# Patient Record
Sex: Female | Born: 1940
Health system: Southern US, Community
[De-identification: ages and names within clinical notes are randomized; demographics above are authoritative.]

## PROBLEM LIST (undated history)

## (undated) DIAGNOSIS — D367 Benign neoplasm of other specified sites: Secondary | ICD-10-CM

## (undated) DIAGNOSIS — G4730 Sleep apnea, unspecified: Secondary | ICD-10-CM

## (undated) DIAGNOSIS — C50919 Malignant neoplasm of unspecified site of unspecified female breast: Secondary | ICD-10-CM

## (undated) DIAGNOSIS — I2510 Atherosclerotic heart disease of native coronary artery without angina pectoris: Secondary | ICD-10-CM

## (undated) DIAGNOSIS — I10 Essential (primary) hypertension: Secondary | ICD-10-CM

## (undated) DIAGNOSIS — F17201 Nicotine dependence, unspecified, in remission: Secondary | ICD-10-CM

## (undated) DIAGNOSIS — I679 Cerebrovascular disease, unspecified: Secondary | ICD-10-CM

## (undated) DIAGNOSIS — K219 Gastro-esophageal reflux disease without esophagitis: Secondary | ICD-10-CM

## (undated) DIAGNOSIS — J449 Chronic obstructive pulmonary disease, unspecified: Secondary | ICD-10-CM

## (undated) DIAGNOSIS — Z955 Presence of coronary angioplasty implant and graft: Secondary | ICD-10-CM

## (undated) DIAGNOSIS — M1990 Unspecified osteoarthritis, unspecified site: Secondary | ICD-10-CM

## (undated) DIAGNOSIS — F419 Anxiety disorder, unspecified: Secondary | ICD-10-CM

## (undated) DIAGNOSIS — F32A Depression, unspecified: Secondary | ICD-10-CM

## (undated) DIAGNOSIS — D563 Thalassemia minor: Secondary | ICD-10-CM

## (undated) DIAGNOSIS — N189 Chronic kidney disease, unspecified: Secondary | ICD-10-CM

## (undated) DIAGNOSIS — E785 Hyperlipidemia, unspecified: Secondary | ICD-10-CM

## (undated) HISTORY — DX: Hyperlipidemia, unspecified: E78.5

## (undated) HISTORY — DX: Chronic obstructive pulmonary disease, unspecified: J44.9

## (undated) HISTORY — DX: Essential (primary) hypertension: I10

## (undated) HISTORY — PX: CARDIAC CATHETERIZATION: SHX172

## (undated) HISTORY — PX: INGUINAL HERNIA REPAIR: SUR1180

## (undated) HISTORY — DX: Cerebrovascular disease, unspecified: I67.9

## (undated) HISTORY — DX: Malignant neoplasm of unspecified site of unspecified female breast: C50.919

## (undated) HISTORY — DX: Atherosclerotic heart disease of native coronary artery without angina pectoris: I25.10

## (undated) HISTORY — DX: Thalassemia minor: D56.3

## (undated) HISTORY — DX: Nicotine dependence, unspecified, in remission: F17.201

## (undated) HISTORY — PX: DILATION AND CURETTAGE OF UTERUS: SHX78

---

## 1978-06-02 HISTORY — PX: VAGINAL HYSTERECTOMY: SUR661

## 1997-12-13 ENCOUNTER — Ambulatory Visit (HOSPITAL_BASED_OUTPATIENT_CLINIC_OR_DEPARTMENT_OTHER): Admission: RE | Admit: 1997-12-13 | Discharge: 1997-12-13 | Payer: Self-pay | Admitting: *Deleted

## 1998-06-02 DIAGNOSIS — Z955 Presence of coronary angioplasty implant and graft: Secondary | ICD-10-CM

## 1998-06-02 HISTORY — PX: COLONOSCOPY: SHX174

## 1998-06-02 HISTORY — DX: Presence of coronary angioplasty implant and graft: Z95.5

## 1998-06-02 HISTORY — PX: ESOPHAGOGASTRODUODENOSCOPY: SHX1529

## 1998-11-09 ENCOUNTER — Encounter: Payer: Self-pay | Admitting: Family Medicine

## 1998-11-10 ENCOUNTER — Inpatient Hospital Stay (HOSPITAL_COMMUNITY): Admission: EM | Admit: 1998-11-10 | Discharge: 1998-11-13 | Payer: Self-pay | Admitting: Family Medicine

## 1999-04-30 ENCOUNTER — Ambulatory Visit (HOSPITAL_COMMUNITY): Admission: RE | Admit: 1999-04-30 | Discharge: 1999-04-30 | Payer: Self-pay | Admitting: *Deleted

## 1999-05-09 ENCOUNTER — Ambulatory Visit (HOSPITAL_COMMUNITY): Admission: RE | Admit: 1999-05-09 | Discharge: 1999-05-09 | Payer: Self-pay | Admitting: *Deleted

## 1999-06-03 DIAGNOSIS — I2510 Atherosclerotic heart disease of native coronary artery without angina pectoris: Secondary | ICD-10-CM

## 1999-06-03 HISTORY — DX: Atherosclerotic heart disease of native coronary artery without angina pectoris: I25.10

## 2000-05-14 ENCOUNTER — Inpatient Hospital Stay (HOSPITAL_COMMUNITY): Admission: AD | Admit: 2000-05-14 | Discharge: 2000-05-19 | Payer: Self-pay | Admitting: Family Medicine

## 2000-05-14 ENCOUNTER — Encounter: Payer: Self-pay | Admitting: Family Medicine

## 2000-05-15 ENCOUNTER — Encounter: Payer: Self-pay | Admitting: Family Medicine

## 2000-09-09 ENCOUNTER — Encounter (HOSPITAL_COMMUNITY): Admission: RE | Admit: 2000-09-09 | Discharge: 2000-10-09 | Payer: Self-pay | Admitting: Preventative Medicine

## 2003-09-18 ENCOUNTER — Ambulatory Visit (HOSPITAL_COMMUNITY): Admission: RE | Admit: 2003-09-18 | Discharge: 2003-09-18 | Payer: Self-pay | Admitting: Family Medicine

## 2004-06-02 HISTORY — PX: COLONOSCOPY: SHX174

## 2004-06-02 HISTORY — PX: ESOPHAGOGASTRODUODENOSCOPY: SHX1529

## 2004-11-14 ENCOUNTER — Ambulatory Visit (HOSPITAL_COMMUNITY): Admission: RE | Admit: 2004-11-14 | Discharge: 2004-11-14 | Payer: Self-pay | Admitting: Family Medicine

## 2005-05-14 ENCOUNTER — Encounter: Admission: RE | Admit: 2005-05-14 | Discharge: 2005-05-14 | Payer: Self-pay | Admitting: Gastroenterology

## 2005-05-19 ENCOUNTER — Ambulatory Visit (HOSPITAL_COMMUNITY): Admission: RE | Admit: 2005-05-19 | Discharge: 2005-05-19 | Payer: Self-pay | Admitting: Gastroenterology

## 2005-05-19 ENCOUNTER — Encounter (INDEPENDENT_AMBULATORY_CARE_PROVIDER_SITE_OTHER): Payer: Self-pay | Admitting: *Deleted

## 2005-05-22 ENCOUNTER — Ambulatory Visit: Payer: Self-pay | Admitting: Cardiology

## 2005-05-30 ENCOUNTER — Ambulatory Visit: Payer: Self-pay | Admitting: Cardiology

## 2005-05-30 ENCOUNTER — Encounter (HOSPITAL_COMMUNITY): Admission: RE | Admit: 2005-05-30 | Discharge: 2005-05-30 | Payer: Self-pay | Admitting: Cardiology

## 2006-06-19 ENCOUNTER — Ambulatory Visit (HOSPITAL_COMMUNITY): Admission: RE | Admit: 2006-06-19 | Discharge: 2006-06-19 | Payer: Self-pay | Admitting: Family Medicine

## 2006-11-12 ENCOUNTER — Emergency Department (HOSPITAL_COMMUNITY): Admission: EM | Admit: 2006-11-12 | Discharge: 2006-11-12 | Payer: Self-pay | Admitting: Emergency Medicine

## 2007-05-04 ENCOUNTER — Ambulatory Visit: Payer: Self-pay | Admitting: Cardiology

## 2007-05-04 ENCOUNTER — Ambulatory Visit (HOSPITAL_COMMUNITY): Admission: RE | Admit: 2007-05-04 | Discharge: 2007-05-04 | Payer: Self-pay | Admitting: Cardiology

## 2007-10-12 ENCOUNTER — Ambulatory Visit: Payer: Self-pay | Admitting: Cardiology

## 2007-10-13 ENCOUNTER — Ambulatory Visit (HOSPITAL_COMMUNITY): Admission: RE | Admit: 2007-10-13 | Discharge: 2007-10-13 | Payer: Self-pay | Admitting: Cardiology

## 2007-10-13 ENCOUNTER — Ambulatory Visit: Payer: Self-pay | Admitting: Cardiology

## 2007-10-13 ENCOUNTER — Encounter: Payer: Self-pay | Admitting: Cardiology

## 2007-10-22 ENCOUNTER — Ambulatory Visit: Payer: Self-pay | Admitting: Internal Medicine

## 2007-10-22 DIAGNOSIS — E782 Mixed hyperlipidemia: Secondary | ICD-10-CM | POA: Insufficient documentation

## 2007-10-22 DIAGNOSIS — I10 Essential (primary) hypertension: Secondary | ICD-10-CM

## 2007-10-22 DIAGNOSIS — R059 Cough, unspecified: Secondary | ICD-10-CM | POA: Insufficient documentation

## 2007-10-22 DIAGNOSIS — R05 Cough: Secondary | ICD-10-CM

## 2007-10-22 DIAGNOSIS — E785 Hyperlipidemia, unspecified: Secondary | ICD-10-CM | POA: Insufficient documentation

## 2007-11-05 ENCOUNTER — Ambulatory Visit: Payer: Self-pay | Admitting: Internal Medicine

## 2007-12-17 ENCOUNTER — Ambulatory Visit: Payer: Self-pay | Admitting: Internal Medicine

## 2007-12-17 ENCOUNTER — Encounter: Payer: Self-pay | Admitting: Internal Medicine

## 2007-12-31 ENCOUNTER — Ambulatory Visit: Payer: Self-pay | Admitting: Internal Medicine

## 2008-01-21 ENCOUNTER — Ambulatory Visit (HOSPITAL_COMMUNITY): Admission: RE | Admit: 2008-01-21 | Discharge: 2008-01-21 | Payer: Self-pay | Admitting: Family Medicine

## 2008-02-09 ENCOUNTER — Ambulatory Visit: Payer: Self-pay | Admitting: Cardiology

## 2008-02-17 ENCOUNTER — Other Ambulatory Visit: Admission: RE | Admit: 2008-02-17 | Discharge: 2008-02-17 | Payer: Self-pay | Admitting: Family Medicine

## 2008-06-12 ENCOUNTER — Ambulatory Visit: Payer: Self-pay | Admitting: Cardiology

## 2009-01-03 ENCOUNTER — Ambulatory Visit: Payer: Self-pay | Admitting: Cardiology

## 2009-01-03 ENCOUNTER — Encounter: Payer: Self-pay | Admitting: Cardiology

## 2009-01-03 DIAGNOSIS — Z87891 Personal history of nicotine dependence: Secondary | ICD-10-CM

## 2009-01-03 DIAGNOSIS — I2510 Atherosclerotic heart disease of native coronary artery without angina pectoris: Secondary | ICD-10-CM | POA: Insufficient documentation

## 2009-01-04 ENCOUNTER — Encounter: Payer: Self-pay | Admitting: Cardiology

## 2009-01-04 ENCOUNTER — Encounter (INDEPENDENT_AMBULATORY_CARE_PROVIDER_SITE_OTHER): Payer: Self-pay | Admitting: *Deleted

## 2009-01-04 LAB — CONVERTED CEMR LAB
Basophils Absolute: 0 10*3/uL (ref 0.0–0.1)
Basophils Relative: 0 % (ref 0–1)
Eosinophils Absolute: 0.3 10*3/uL
Eosinophils Relative: 3 %
Ferritin: 13 ng/mL
Ferritin: 13 ng/mL (ref 10–291)
Folate: >20 ng/mL
Folate: >20 ng/mL
Hemoglobin: 11.9 g/dL
Hemoglobin: 11.9 g/dL — ABNORMAL LOW (ref 12.0–15.0)
Iron: 53 ug/dL
Lymphocytes Relative: 20 %
Lymphocytes Relative: 20 % (ref 12–46)
Lymphs Abs: 1.6 10*3/uL
MCHC: 30.7 g/dL
Monocytes Absolute: 0.6 10*3/uL (ref 0.1–1.0)
Monocytes Relative: 8 %
Neutro Abs: 5.3 10*3/uL (ref 1.7–7.7)
Neutrophils Relative %: 68 % (ref 43–77)
RBC: 5.13 M/uL
RBC: 5.13 M/uL — ABNORMAL HIGH (ref 3.87–5.11)
RDW: 15.8 % — ABNORMAL HIGH (ref 11.5–15.5)
Saturation Ratios: 14 %
Saturation Ratios: 14 % — ABNORMAL LOW (ref 20–55)
TIBC: 381 ug/dL (ref 250–470)
UIBC: 328 ug/dL
WBC: 7.7 10*3/uL

## 2009-01-10 ENCOUNTER — Encounter: Payer: Self-pay | Admitting: Cardiology

## 2009-01-19 ENCOUNTER — Encounter: Payer: Self-pay | Admitting: Cardiology

## 2009-01-19 LAB — CONVERTED CEMR LAB: OCCULT 3: NEGATIVE

## 2009-01-22 ENCOUNTER — Ambulatory Visit (HOSPITAL_COMMUNITY): Admission: RE | Admit: 2009-01-22 | Discharge: 2009-01-22 | Payer: Self-pay | Admitting: Family Medicine

## 2009-02-06 ENCOUNTER — Encounter: Payer: Self-pay | Admitting: Cardiology

## 2009-02-06 ENCOUNTER — Encounter (INDEPENDENT_AMBULATORY_CARE_PROVIDER_SITE_OTHER): Payer: Self-pay | Admitting: *Deleted

## 2009-02-06 LAB — CONVERTED CEMR LAB
ALT: 14 units/L (ref 0–35)
Albumin: 4.4 g/dL
BUN: 20 mg/dL (ref 6–23)
CO2: 26 meq/L
CO2: 26 meq/L (ref 19–32)
Calcium: 9.7 mg/dL
Calcium: 9.7 mg/dL (ref 8.4–10.5)
Chloride: 105 meq/L
Chloride: 105 meq/L (ref 96–112)
Creatinine, Ser: 1.22 mg/dL — ABNORMAL HIGH (ref 0.40–1.20)
Glucose, Bld: 97 mg/dL
Glucose, Bld: 97 mg/dL (ref 70–99)
Potassium: 4.7 meq/L
Sodium: 143 meq/L
Total Bilirubin: 0.3 mg/dL (ref 0.3–1.2)
Total Protein: 7.2 g/dL

## 2009-02-07 ENCOUNTER — Encounter (INDEPENDENT_AMBULATORY_CARE_PROVIDER_SITE_OTHER): Payer: Self-pay | Admitting: *Deleted

## 2009-12-24 ENCOUNTER — Telehealth (INDEPENDENT_AMBULATORY_CARE_PROVIDER_SITE_OTHER): Payer: Self-pay | Admitting: *Deleted

## 2010-01-02 ENCOUNTER — Ambulatory Visit: Payer: Self-pay | Admitting: Cardiology

## 2010-01-02 ENCOUNTER — Ambulatory Visit (HOSPITAL_COMMUNITY): Admission: RE | Admit: 2010-01-02 | Discharge: 2010-01-02 | Payer: Self-pay | Admitting: Cardiology

## 2010-01-02 ENCOUNTER — Encounter: Payer: Self-pay | Admitting: Cardiology

## 2010-01-21 ENCOUNTER — Encounter: Payer: Self-pay | Admitting: Cardiology

## 2010-01-21 LAB — CONVERTED CEMR LAB
ALT: 16 units/L
Albumin: 4.5 g/dL
Alkaline Phosphatase: 49 units/L
Chloride: 105 meq/L
Cholesterol: 152 mg/dL
Glucose, Bld: 92 mg/dL
HCT: 36.6 %
Hemoglobin: 11.3 g/dL
Potassium: 3.9 meq/L
Sodium: 142 meq/L
Total Protein: 6.8 g/dL
Triglycerides: 113 mg/dL

## 2010-01-24 ENCOUNTER — Ambulatory Visit (HOSPITAL_COMMUNITY): Admission: RE | Admit: 2010-01-24 | Discharge: 2010-01-24 | Payer: Self-pay | Admitting: Family Medicine

## 2010-01-29 ENCOUNTER — Encounter (INDEPENDENT_AMBULATORY_CARE_PROVIDER_SITE_OTHER): Payer: Self-pay | Admitting: *Deleted

## 2010-01-31 ENCOUNTER — Ambulatory Visit: Payer: Self-pay | Admitting: Cardiology

## 2010-01-31 DIAGNOSIS — I679 Cerebrovascular disease, unspecified: Secondary | ICD-10-CM

## 2010-01-31 DIAGNOSIS — D509 Iron deficiency anemia, unspecified: Secondary | ICD-10-CM | POA: Insufficient documentation

## 2010-02-01 ENCOUNTER — Encounter (INDEPENDENT_AMBULATORY_CARE_PROVIDER_SITE_OTHER): Payer: Self-pay | Admitting: *Deleted

## 2010-02-11 ENCOUNTER — Ambulatory Visit (HOSPITAL_COMMUNITY): Admission: RE | Admit: 2010-02-11 | Discharge: 2010-02-11 | Payer: Self-pay | Admitting: Cardiology

## 2010-02-21 ENCOUNTER — Encounter (INDEPENDENT_AMBULATORY_CARE_PROVIDER_SITE_OTHER): Payer: Self-pay | Admitting: *Deleted

## 2010-07-02 NOTE — Letter (Signed)
Summary: [facility]  [facility]   Imported By: Faythe Ghee 01/21/2010 11:33:11  _____________________________________________________________________  External Attachment:    Type:   Image     Comment:   External Document

## 2010-07-02 NOTE — Letter (Signed)
Summary: [place] Future Lab Work Engineer, agricultural at [employer]  [address], [place] [address]   Phone: [phone]  Fax: [phone]     February 01, 2010 MRN: [id number]   [facility] [address] [place], [place]  [address]      YOUR LAB WORK IS DUE   August 01, 2010  Please go to [facility], located across the street from [facility] on the second floor.  Hours are Monday - Friday 7am until 7:30pm         Saturday 8am until 12noon    _X_  DO NOT EAT OR DRINK AFTER MIDNIGHT EVENING PRIOR TO LABWORK  __ YOUR LABWORK IS NOT FASTING --YOU MAY EAT PRIOR TO LABWORK

## 2010-07-02 NOTE — Letter (Signed)
Summary: Generic Letter  Architectural technologist at [facility]  [address] [place] [address]   Phone: [phone]  Fax: [phone]        February 01, 2010 MRN: [id number]    [facility] [address] [place], [place]  [address]    Dear Ms. DAWE,       After you left the office visit on 01/31/10.  Dr. Dietrich Pates decided to increase your crestor to 40mg  daily.  You are to begin this with your next refill.  I have sent the prescription to the drugstore.  Dr. Dietrich Pates would like to repeat a lipid panel in 6 months.      Sincerely, Teressa Lower RN  This letter has been electronically signed by your physician.

## 2010-07-02 NOTE — Letter (Signed)
Summary: [place] Results Engineer, agricultural at [facility]  [address] [place] [address]   Phone: [phone]  Fax: [phone]      February 21, 2010 MRN: [id number]   [facility] [address] [place], [place]  [address]   Dear Ms. Maisie Fus,  Your test ordered by Selena Batten has been reviewed by your physician (or physician assistant) and was found to be normal or stable. Your physician (or physician assistant) felt no changes were needed at this time.  ____ Echocardiogram  ____ Cardiac Stress Test  ____ Lab Work  __X__ Peripheral vascular study of arms, legs or neck  ____ CT scan or X-ray  ____ Lung or Breathing test  ____ Other:  No change in medical treatment at this time, per Dr. Dietrich Pates.    Thank you, Nahlia Hellmann Allyne Gee RN    [place] Bing, MD, Lenise Arena.C.Gaylord Shih, MD, F.A.C.C Lewayne Bunting, MD, F.A.C.C Nona Dell, MD, F.A.C.C Charlton Haws, MD, Lenise Arena.C.C

## 2010-07-02 NOTE — Assessment & Plan Note (Signed)
Summary: F1Y  Medications Added BENICAR HCT 40-25 MG TABS (OLMESARTAN MEDOXOMIL-HCTZ) take 1 tab daily CRESTOR 40 MG TABS (ROSUVASTATIN CALCIUM) Take one tablet by mouth daily. ASPIR-LOW 81 MG TBEC (ASPIRIN) take 1 tab daily      Allergies Added: NKDA  Visit Type:  Follow-up Primary Provider:  Ocie Bob   History of Present Illness: [name] returns to the office for continued assessment and treatment of coronary artery disease and cardiovascular risk factors.  Since her last visit, she has done quite well.  She reports no chest pain nor dyspnea.  She has no orthopnea, PND, lightheadedness nor syncope.  Prior records were obtained from [facility] and reviewed.  -  Date:  01/21/2010    LDL-calculated: 81   Current Medications (verified): 1)  Metformin Hcl 500 Mg  Tabs (Metformin Hcl) .... Take 1 Tablet By Mouth Once A Day 2)  Toprol Xl 25 Mg  Tb24 (Metoprolol Succinate) .... Take 1 Tablet By Mouth Once A Day 3)  Benicar Hct 40-25 Mg Tabs (Olmesartan Medoxomil-Hctz) .... Take 1 Tab Daily 4)  Amlodipine Besylate 10 Mg Tabs (Amlodipine Besylate) .... Take One Tablet By Mouth Daily 5)  Folic Acid 1 Mg Tabs (Folic Acid) .... Take 1 Tablet By Mouth Once A Day 6)  Crestor 40 Mg Tabs (Rosuvastatin Calcium) .... Take One Tablet By Mouth Daily. 7)  Fenofibrate 160 Mg Tabs (Fenofibrate) .... Take One Tablet By Mouth Daily With A Meal 8)  Aspir-Low 81 Mg Tbec (Aspirin) .... Take 1 Tab Daily  Allergies (verified): No Known Drug Allergies  Past History:  PMH, FH, and Social History reviewed and updated.  Past Medical History: ASCVD: Bare-metal stent placed in the right coronary artery in 12/01; residual 50% lesion of the first diagonal and mid LAD. HYPERTENSION (ICD-401.9) HYPERLIPIDEMIA (ICD-272.4) Adult-onset diabetes mellitus; excellent control with a low-dose of a single oral agent. Tobacco use-discontinued in 1980. COUGH; Onset in 2009; marked improvement by  2010.  Past Surgical History: Hernia repair 1970's Vaginal hysterectomy and unilateral oophorectomy-1980. Colonoscopy-approximately 2006  Review of Systems       See history of present illness.  Vital Signs:  Patient profile:   70 year old female Weight:      178 pounds BMI:     31.65 Pulse rate:   75 / minute BP sitting:   122 / 78  (right arm)  Vitals Entered By: Dreama Saa, CNA (January 31, 2010 10:29 AM)  Physical Exam  General:  Pleasant; overweight; well developed; no acute distress:   Weight: 178, 8 pounds more than last year Neck-No JVD; faint bilateral carotid bruits: Lungs-No tachypnea, no rales; no rhonchi; no wheezes: Cardiovascular-normal PMI; normal S1 and S2; minimal systolic murmur Abdomen-BS normal; soft and non-tender without masses or organomegaly:  Musculoskeletal-No deformities, no cyanosis or clubbing: Neurologic-Normal cranial nerves; symmetric strength and tone:  Skin-Warm, no significant lesions: Extremities-Nl distal pulses; no edema:     Exercise Stress Test  Procedure date:  01/02/2010  Findings:      CONCLUSION:   1. Negative exercise treadmill test for ischemia by standard criteria       the maximum workload of 10.1 mets.  No chest pain was reported.       There was a mildly hypertensive response to exercise.   2. No inducible wall motion abnormalities to indicate ischemia.               Jonelle Sidle, MD  SGM/MEDQ  D:  01/02/2010  T:  01/03/2010  Job:  [id number]   Impression & Recommendations:  Problem # 1:  CAROTID BRUITS, BILATERAL (ICD-785.9) Bruits have not been appreciated at previous visits, but patient has known vascular disease, and a carotid ultrasound study is warranted.  Problem # 2:  ATHEROSCLEROTIC CARDIOVASCULAR DISEASE (ICD-429.2) No symptoms at present to suggest myocardial ischemia; management will continue to focus on optimal control of risk factors.  Problem # 3:  HYPERTENSION  (ICD-401.9) Blood pressure control has been excellent; current medications will be continued.  Problem # 4:  HYPERLIPIDEMIA (ICD-272.4) Reason lipid profile was quite good with total cholesterol of 152, triglycerides 113, HDL 48 and LDL of 81.  Slightly better results might be obtained with Crestor 40 mg q.d., which will be prescribed.  Lipid profile will be repeated in 6 months with a return visit in one year.  Problem # 5:  ANEMIA (ICD-285.9)  Patient has had marginally low hemoglobin, generally around 11-11.5, with a slightly low MCV in the mid 70s and iron studies suggestive of  iron deficiency(iron of 53, TIBC of 381 and ferritin of 13), but with negative stool Hemoccult testing.  Additional Hemoccult samples will be collected.  I've advised Ms. Righter to discuss this with Dr. Parke Simmers and to consider either additional GI studies or hematology consultation.  A hemoglobin electrophoresis would be worthwhile.  Ms. Strick underwent colonoscopy approximately 5 years ago with negative results.  Other Orders: Hemoccult Cards (Take Home) (Hemoccult Cards) Carotid Duplex (Carotid Duplex) Future Orders: T-Lipid Profile ([id number]) ... 08/01/2010  Patient Instructions: 1)  Your physician recommends that you schedule a follow-up appointment in: 1 year 2)  Your physician has requested that you have a carotid duplex. This test is an ultrasound of the carotid arteries in your neck. It looks at blood flow through these arteries that supply the brain with blood. Allow one hour for this exam. There are no restrictions or special instructions. 3)  Your physician has asked that you test your stool for blood. It is necessary to test 3 different stool specimens for accuracy. You will be given 3 hemoccult cards for specimen collection. For each stool specimen, place a small portion of stool sample (from 2 different areas of the stool) into the 2 squares on the card. Close card. Repeat with 2 more stool specimens.  Bring the cards back to the office for testing. 4)  Your physician has recommended you make the following change in your medication: increase crestor to 40mg  daily with next refill 5)  Your physician recommends that you return for lab work in: fasting lipid panel in 6 months Prescriptions: CRESTOR 40 MG TABS (ROSUVASTATIN CALCIUM) Take one tablet by mouth daily.  #30 x 3   Entered by:   Teressa Lower RN   Authorized by:   Kathlen Brunswick, MD, [facility]   Signed by:   Teressa Lower RN on 02/01/2010   Method used:   Electronically to        [facility]  [address] (retail)       [address]       [place], [place]  [address]       Ph: [phone]       Fax: [phone]   RxID:   [id number]

## 2010-07-02 NOTE — Miscellaneous (Signed)
Summary: LABS CMP,02/06/2009  Clinical Lists Changes  Observations: Added new observation of CALCIUM: 9.7 mg/dL (98/04/9146 8:29) Added new observation of ALBUMIN: 4.4 g/dL (56/21/3086 5:78) Added new observation of PROTEIN, TOT: 7.2 g/dL (46/96/2952 8:41) Added new observation of SGPT (ALT): 14 units/L (02/06/2009 9:48) Added new observation of SGOT (AST): 20 units/L (02/06/2009 9:48) Added new observation of ALK PHOS: 55 units/L (02/06/2009 9:48) Added new observation of CREATININE: 1.22 mg/dL (32/44/0102 7:25) Added new observation of BUN: 20 mg/dL (36/64/4034 7:42) Added new observation of BG RANDOM: 97 mg/dL (59/56/3875 6:43) Added new observation of CO2 PLSM/SER: 26 meq/L (02/06/2009 9:48) Added new observation of CL SERUM: 105 meq/L (02/06/2009 9:48) Added new observation of K SERUM: 4.7 meq/L (02/06/2009 9:48) Added new observation of NA: 143 meq/L (02/06/2009 9:48) Added new observation of FOLATE: >20.0 ng/mL (01/04/2009 9:48) Added new observation of FERRITIN: 13 ng/mL (01/04/2009 9:48) Added new observation of IRON SATUR %: 14 % (01/04/2009 9:48) Added new observation of TIBC: 381 mcg/dL (32/95/1884 1:66) Added new observation of UIBC: 328 mcg/dL (11/30/1599 0:93) Added new observation of IRON: 53 mcg/dL (23/55/7322 0:25) Added new observation of ABSOLUTE BAS: 0.0 K/uL (01/04/2009 9:48) Added new observation of BASOPHIL %: 0 % (01/04/2009 9:48) Added new observation of EOS ABSLT: 0.3 K/uL (01/04/2009 9:48) Added new observation of % EOS AUTO: 3 % (01/04/2009 9:48) Added new observation of ABSOLUTE MON: 0.6 K/uL (01/04/2009 9:48) Added new observation of MONOCYTE %: 8 % (01/04/2009 9:48) Added new observation of ABS LYMPHOCY: 1.6 K/uL (01/04/2009 9:48) Added new observation of LYMPHS %: 20 % (01/04/2009 9:48) Added new observation of PLATELETK/UL: 308 K/uL (01/04/2009 9:48) Added new observation of RDW: 15.8 % (01/04/2009 9:48) Added new observation of MCHC RBC: 30.7 g/dL  (42/70/6237 6:28) Added new observation of MCV: 75.6 fL (01/04/2009 9:48) Added new observation of HCT: 38.8 % (01/04/2009 9:48) Added new observation of HGB: 11.9 g/dL (31/51/7616 0:73) Added new observation of RBC M/UL: 5.13 M/uL (01/04/2009 9:48) Added new observation of WBC COUNT: 7.7 10*3/microliter (01/04/2009 9:48)

## 2010-07-02 NOTE — Progress Notes (Signed)
Summary: Returning your call   Phone Note Call from Patient   Caller: Patient Reason for Call: Talk to Doctor Summary of Call: pt returning call to [facility] regarding [facility] Initial call taken by: Raechel Ache [facility],  December 24, 2009 10:57 AM  Follow-up for Phone Call        Pt made aware of need for stress echo before exercise program Follow-up by: Teressa Lower RN,  December 24, 2009 2:34 PM  Additional Follow-up for Phone Call Additional follow up Details #1::        appt for stress echo 01/02/2010 9:30am called pt, instructions given to hold toprol and all other bp meds that am Additional Follow-up by: Teressa Lower RN,  December 25, 2009 4:28 PM

## 2010-08-01 ENCOUNTER — Encounter: Payer: Self-pay | Admitting: Cardiology

## 2010-08-01 LAB — CONVERTED CEMR LAB
LDL Cholesterol: 68 mg/dL (ref 0–99)
Total CHOL/HDL Ratio: 3.4
VLDL: 26 mg/dL (ref 0–40)

## 2010-08-05 ENCOUNTER — Encounter (INDEPENDENT_AMBULATORY_CARE_PROVIDER_SITE_OTHER): Payer: Self-pay | Admitting: *Deleted

## 2010-08-13 NOTE — Letter (Signed)
Summary: [place] Results Engineer, agricultural at [place]  [address] [place] [address]   Phone: [phone]  Fax: [phone]      August 05, 2010 MRN: [id number]   [facility] [address] [place], [place]  [address]   Dear Ms. Maisie Fus,  Your test ordered by Selena Batten has been reviewed by your physician (or physician assistant) and was found to be normal or stable. Your physician (or physician assistant) felt no changes were needed at this time.  ____ Echocardiogram  ____ Cardiac Stress Test  __x__ Lab Work  ____ Peripheral vascular study of arms, legs or neck  ____ CT scan or X-ray  ____ Lung or Breathing test  ____ Other:  No change in medical treatment at this time, per Ancil Linsey. Thank you, Mariachristina Holle Allyne Gee RN    [place] Bing, MD, Lenise Arena.C.Gaylord Shih, MD, F.A.C.C Lewayne Bunting, MD, F.A.C.C Nona Dell, MD, F.A.C.C Charlton Haws, MD, Lenise Arena.C.C

## 2010-10-15 NOTE — Letter (Signed)
Oct 12, 2007    Renaye Rakers, M.D.  [phone]  [place], [place] [address]   RE:  Nancy Liu, Nancy Liu  MRN:  [id number]  /  DOB:  [birth year]   Dear Nancy Liu:   Nancy Liu returns to the office for continued assessment and treatment  of coronary disease, now eight years following stent implantation in the  right coronary artery.  She has done well from a cardiac standpoint with  no subsequent symptoms to suggest myocardial ischemia and good control  of risk factors.  Unfortunately, she has experienced a nonproductive  cough for the past 5-6 months.  A chest x-ray performed last December  was negative.  She does not recall a URI initiating these symptoms.  She  has paroxysms of cough associated with dyspnea and chest tightness.  Symptoms increase which he lies flat and supine.  She has had no edema  nor exertional dyspnea.  She did have wheezing at one time, but you  provided her with albuterol and Singulair, which has resolved those  symptoms.   CURRENT MEDICATIONS:  Current medications are unchanged from her last  visit except for the addition of the above-noted drugs.   PHYSICAL EXAMINATION:  GENERAL APPEARANCE:  A pleasant woman with  frequent cough.  VITAL SIGNS:  The weight is 171, 1 pound more than in December.  Blood  pressure 135/90, heart rate 70 and regular, respirations 14.  NECK:  No jugular venous distention; normal carotid upstrokes without  bruits.  LUNGS:  Entirely clear.  Normal expiratory phase.  CARDIAC:  Normal first and second heart sounds; fourth heart sound and  modest systolic ejection murmur at the cardiac base.  ABDOMEN:  Soft and nontender; no bruits.  EXTREMITIES:  No edema; distal pulses intact.   On update of her social history, she has a 35 pack-year use of  cigarettes that was discontinued in 1980.   IMPRESSION:  Nancy Liu has a persistent cough that is essentially  nonproductive and for which there is no apparent etiology.  She is not  using  an ACE inhibitor.  I doubt that this is cardiac asthma, but will  check a chemistry profile, BNP level and an echocardiogram.  Chest x-ray  will be repeated.  As we discussed, I will refer her to [facility] for further assessment and treatment.  I will plan to see this  nice woman again in 3 months.    Sincerely,      Gerrit Friends. Dietrich Pates, MD, [facility]  Electronically Signed    RMR/MedQ  DD: 10/12/2007  DT: 10/12/2007  Job #: [id number]

## 2010-10-15 NOTE — Letter (Signed)
February 09, 2008    Nancy Rakers, MD  [phone] [address]  [place], [place] [address]   RE:  Nancy Liu, Nancy Liu  MRN:  [id number]  /  DOB:  07-24-1940   Dear Nancy Liu,   Nancy Liu returns to the office for continued assessment and treatment  of coronary disease and cardiovascular risk factors.  Since her last  visit, she has been seen on number of occasions by Dr. Sherene Sires.  He has  treated her for GERD primarily with very substantial improvement in her  chronic cough.  She still needs to clear her throat every once in a  while and experiences minimal dyspnea and now an again, but says that  she is very happy with the way she feels on the day-to-day basis.  She  has had no recurrent chest discomfort.  She went through number of  trials off medication and is currently off rosuvastatin and Niaspan.  She has stopped metformin due to constipation.  I suggested that she  discuss this with you.   CURRENT MEDICATIONS:  1. Toprol 25 mg daily.  2. Benicar HCT 40/12.5 mg daily.  3. Amlodipine 10 mg daily.  4. Ezetimibe 10 mg daily.  5. Albuterol by MDI.  6. Singulair 10 mg daily.  7. Meclizine 25 mg t.i.d.  8. Aciphex 20 mg daily.  9. Tramadol 50 mg p.r.n. cough.   PHYSICAL EXAMINATION:  GENERAL:  Pleasant overweight woman in no acute  distress.  VITAL SIGNS:  Weight is 175, 5 pounds more than at her last visit.  Blood pressure 110/70, heart rate 70 and regular, respirations 15.  NECK:  No jugular venous distention; no carotid bruits.  LUNGS:  Entirely clear.  CARDIAC:  Normal first and second heart sounds; fourth heart sound and  modest systolic murmur noted.  ABDOMEN:  Soft and nontender; no bruits; no organomegaly.  EXTREMITIES:  No edema.   IMPRESSION:  Nancy Liu is doing quite well overall.  She has no  symptoms to suggest recurrent ischemic heart disease.  She will resume  Niaspan and titrate back to her dose of 1000 mg daily.  She will resume  rosuvastatin.  A lipid profile will  be obtained in 3 months.   Blood pressure is quite low.  She is on a low dose of beta-blocker,  which may not be provided much benefit.  Toprol will be discontinued.  She will follow blood pressures and call should there be significant  elevations above 135/85.  I will plan to see this nice woman again in 9  months.    Sincerely,      Nancy Friends. Dietrich Pates, MD, [facility]  Electronically Signed    RMR/MedQ  DD: 02/09/2008  DT: 02/10/2008  Job #: [id number]

## 2010-10-15 NOTE — Letter (Signed)
May 04, 2007    Renaye Rakers, M.D.  [phone] [address]  [place], [place] [address]   RE:  OMAIRA, MELLEN  MRN:  [id number]  /  DOB:  1941/01/08   Dear Adrian Saran:   Ms. Ledger returns to the office after a two-year hiatus.  Fortunately,  she has done well in that interval.  She has continuing intermittent  class 2 dyspnea on exertion, but walks up to three miles a day most  days, without difficulty.  She does not assess diabetic control because  her device at home is not functional.  A recent hemoglobin A1c level was  excellent at 6.5.  Her blood pressure has apparently been good.  Lipid  control is sub-optimal, based upon a recent profile.  She has had no  chest discomfort.  She has had no significant medical issues since her  last visit.   CURRENT MEDICATIONS:  1. Metformin 500 mg daily.  2. Metoprolol 25 mg daily.  3. Amlodipine 10 mg daily.  4. Benicar 40/25 mg daily.  5. Kay-Ciel 20 mEq twice daily.  6. Ezetimibe 10 mg daily.  7. Doxazosin 4 mg daily.  8. Rosuvastatin 10 mg daily.  9. Niaspan 500 mg daily.  10.Aspirin 81 mg daily.   PHYSICAL EXAMINATION:  GENERAL:  A very pleasant woman, in no acute  distress.  VITAL SIGNS:  Weight 170 pounds, eight pounds more than two years ago.  Blood pressure 130/85, heart rate 76 and regular, respirations 16.  NECK:  No jugular venous distention.  Normal carotid upstrokes without  bruits.  LUNGS:  Clear.  HEART:  Normal first and second heart sounds.  Fourth heart sound  present.  ABDOMEN:  Soft, nontender.  No organomegaly.  EXTREMITIES:  No edema.  Normal distal pulses.   IMPRESSION/PLAN:  Ms. Barman is doing generally well:  To simplify and  improve her lipid control regimen, we will stop her rosuvastatin and  Zetia and start Vytorin 80/10 mg daily.  Her dose of Niaspan will be  increased in a step-wise fashion to a total of 1500 mg daily.  A lipid  profile will be repeated in one month.  If the results are good, will  plan to see this nice woman again in one year.  Control of other  cardiovascular risk factors appears to be optimal.    Sincerely,      Gerrit Friends. Dietrich Pates, MD, [facility]  Electronically Signed    RMR/MedQ  DD: 05/04/2007  DT: 05/04/2007  Job #: [id number]

## 2010-10-18 NOTE — Op Note (Signed)
[facility]  Patient:    Nancy Liu                       MRN: [id number] Proc. Date: 05/09/99 Adm. Date:  [id number] Attending:  Sharyn Dross                           Operative Report  PREOPERATIVE DIAGNOSES: 1. Hemoccult positive. 2. History of gastroesophageal reflux disease.  POSTOPERATIVE DIAGNOSIS:  Normal colonoscopic examination to the cecum.  OPERATION:  Colonoscopy.  SURGEON:  Sharyn Dross., M.D.  PREMEDICATION:  Demerol 40 mg IV and Versed 4 mg IV over a 10 minute period of time.  INSTRUMENTS:  The Olympus video pancolonoscope.  INDICATION:  This pleasant 70 year old female presented to the office with a history of gastroesophageal reflux problems at this time.  The patient is presently taking medications which appears to be helping but occasionally the patient has  reflux symptoms as noted.  PHYSICAL EXAMINATION:  GENERAL:  She is a pleasant female in no distress.  VITAL SIGNS:  Stable.  HEENT:  Anicteric.  NECK:  Supple.  LUNGS:  Clear.  HEART:  Regular rate and rhythm without heaves, thrills, murmurs, or gallops.  ABDOMEN:  No tenderness.  No hepatosplenomegaly appreciated.  EXTREMITIES:  Unremarkable.  RECTAL:  Hemoccult test was positive at this time.  PLAN:  I will proceed with the colonoscopic examination to evaluate the reason or the heme positive stool.  INFORMED CONSENT:  The patient was advised of the procedure, indications, and the risks involved.  The patient has agreed to have the procedure performed.  DESCRIPTION OF PROCEDURE:  The patient was brought in the endoscopy unit where n IV for IV sedating medication was used.  A monitor was placed on the patient to  monitor the patients vital signs and oxygen saturation.  Nasal oxygen at two liters per minute was used.  After adequate sedation was performed, the procedure was begun.  The instrument was advanced to the patient lying  the left lateral position to approximately 90 cm proximal colon to the cecum.  This was confirmed by transillumination, palpation, as well as visualization at the appendiceal orifice.  There appeared to be no gross abnormalities, masses, polyps, or stricture lesions appreciated.  The vascular pattern appeared to be well within normal limits throughout the entire colon.  The mucosal pattern showed no evidence of any granular changes or diverticular changes at this time.  There is no increased tortuosity of the colon upon traversing the region.  There was no evidence of internal or external hemorrhoids upon exiting from the area.  The instrument was removed per rectum without difficulty.  The patient tolerated the procedure well.  TREATMENT:  Conservative management at this time.  We will have the patient follow up with me in the office. DD:  05/09/99 TD:  05/10/99 Job: [id number]

## 2010-10-18 NOTE — Op Note (Signed)
Nancy Liu, Nancy Liu               ACCOUNT NO.:  [id number]   MEDICAL RECORD NO.:  [id number]          PATIENT TYPE:  AMB   LOCATION:  ENDO                         FACILITY:  [facility]   PHYSICIAN:  Anselmo Rod, M.D.  DATE OF BIRTH:  05-27-1941   DATE OF PROCEDURE:  05/19/2005  DATE OF DISCHARGE:  05/19/2005                                 OPERATIVE REPORT   PROCEDURE:  Screening colonoscopy.   ENDOSCOPIST:  Charna Elizabeth, M.D.   INSTRUMENT USED:  Olympus video colonoscope.   INDICATIONS FOR PROCEDURE:  70 year old African American female undergoing  screening colonoscopy.  The patient has a history of rectal bleeding with  iron deficiency anemia, rule out colonic polyps, masses, etc.   PREPROCEDURE PREPARATION:  Informed consent was obtained from the patient.  The patient was fasted for eight hours prior to the procedure and prepped  with a bottle of magnesium citrate and a gallon of GoLYTELY the night prior  to the procedure.  She received prophylactic antibiotics prior to the  procedure, as well.  The risks and benefits of the procedure including a 10%  miss rate of cancer and polyps were discussed with the patient.   PREPROCEDURE PHYSICAL:  Patient with stable vital signs.  Neck supple.  Chest clear to auscultation.  S1 and S2 regular.  Abdomen soft with normal  bowel sounds.   DESCRIPTION OF PROCEDURE:  The patient was placed in the left lateral  decubitus position, sedated with an additional 25 mcg Fentanyl.  Once the  patient was adequately sedated, maintained on low flow oxygen and continuous  cardiac monitoring, the Olympus video colonoscope was advanced from the  rectum to the cecum.  The appendiceal orifice and ileocecal valve were  clearly visualized and photographed.  No masses, polyps, erosions,  ulcerations, or diverticula were seen.  The terminal ileum appeared healthy  without lesions.  Retroflexion in the rectum revealed no abnormalities.  The  patient tolerated  the procedure well.   IMPRESSION:  Normal colonoscopy to the terminal ileum, no masses, polyps,  diverticula, or hemorrhoids seen.   RECOMMENDATIONS:  1.  Continue high fiber diet with liberal fluid intake.  2.  Outpatient follow up in the next four weeks for further recommendations.  3.  Repeat colonoscopy is recommended in the next ten years unless the      patient develops any abnormal symptoms in the interim.      Anselmo Rod, M.D.  Electronically Signed     JNM/MEDQ  D:  05/22/2005  T:  05/23/2005  Job:  [id number]   cc:   Renaye Rakers, M.D.  Fax: [phone]

## 2010-10-18 NOTE — Cardiovascular Report (Signed)
[place]. [facility]  Patient:    Nancy Liu, Nancy Liu                      MRN: [id number] Proc. Date: 05/18/00 Adm. Date:  [id number] Attending:  Beverely Low CC:         Geraldo Pitter, M.D.   Cardiac Catheterization  PROCEDURES: 1. Left heart catheterization. 2. Coronary angiography. 3. Selective renal angiography. 4. Right coronary artery - proximal mid and distal.    a. Percutaneous transluminal coronary balloon angioplasty.    b. Placement of intracoronary stent x 3.  COMPLICATIONS:  None.  INDICATIONS:  Nancy Liu is a 70 year old black female, patient of Dr. Renaye Rakers, with a history of hypertension and hyperlipidemia, as well as chronic renal insufficiency.  She was admitted for crescendo angina.  She is n now referred for cardiac catheterization to define her coronary status.  DESCRIPTION OF PROCEDURE:  After given informed written consent, the patient was brought to the cardiac catheterization lab where her right and left groins were shaved, prepped, and draped in the usual sterile fashion.  Next, a #6 [place] arterial sheath was then inserted in the right femoral artery without complication.  A 6 French diagnostic catheter was then used to perform diagnostic angiography.  This reveals a medium sized left main which was short with no significant disease.  The LAD is a medium sized vessel which coursed to the apex and gave rise to two diagonal branches.  The LAD is noted to be irregular and tapers to a very small vessel at its distal apex but has no high-grade lesion.  The first diagonal is a medium sized vessel with a 50% proximal lesion.  The second diagonal is a small vessel which bifurcates in its proximal portion and has no significant disease.  There is a 50% lesion in the LAD beyond the takeoff of the second diagonal.  The left coronary artery also gave rise to a large ramus intermedius which bifurcates distally and has no  significant disease.  The left circumflex is a medium sized vessel which coursed in the AV groove and gives rise to one obtuse marginal branch.  There is no significant disease in the AV groove circumflex or the first OM.  The right coronary artery is a large vessel which is dominant and gives rise to both the PDA as well as the posterolateral branch.  The RCA is noted to have a 90% proximal, 90% mid, and 80% long distal lesion prior to the bifurcation of the PDA and posterolateral branch.  The PDA and posterolateral branch are medium sized vessels which are irregular but have no high-grade lesion.  There is no LV-gram performed secondary to elevation in creatinine.  By a 2-D echocardiogram, she has normal LV function.  Selected renal angiogram reveals a 30% left proximal renal artery stenosis and 20% proximal right renal artery stenosis.  HEMODYNAMICS:  Systemic pressure 205/100, LV systemic pressure 206/12, LVEDP of 14.  INTERVENTIONAL PROCEDURE:  RCA:  Following diagnostic angiography, the #6 French arterial sheath was then exchanged for a #7 arterial sheath.  A #7 [place] guiding catheter with side holes was coaxially engaged in the right coronary ostium.  Selective angiogram was then performed.  Next, a long, Patriot 0.014 exchange-length guidewire was advanced out of the guiding catheter into the proximal RCA.  The guidewire was then positioned in the PDA without difficulty.  Next, a Maverick 2.75 x 15 mm coronary  balloon was then tracked into the mid RCA stenotic lesion.  One subsequent inflation to a maximum of 8 atmospheres was then performed for a total of one minute. Followup angiogram revealed a small linear dissection with TIMI-3 flow to the distal vessel.  The balloon was then pulled back to the proximal segment and one inflation to a maximum of 10 atmospheres was then performed for a total of one minute.  Followup angiogram revealed irregularities of the proximal  RCA with TIMI-3 flow to the distal vessel.  The balloon was then removed and a NIR Elite 2.5 x 18 mm stent was then tracked into the distal portion of the RCA with careful attention not to extend it beyond he ostium of the PDA and posterolateral branch.  This stent was then employed to a maximum of 14 atmospheres for a total of one minute.  Followup angiogram revealed no evidence of dissection or thrombus or TIMI-3 flow to the distal vessel.  This balloon was then removed, and a second NIR Elite 3.0 x 12 mm stent was then placed in the midportion of the RCA with careful attention to overlap the mid stenotic lesion.  This stent was then deployed to a maximum of 14 atmospheres for a total of one minute.  This balloon was then removed and a third NIR Elite 3.0 x 9 mm stent was then positioned in the proximal RCA stenotic lesion.  This stent was then deployed to a maximum of 14 atmospheres for a total of one minute.  Followup angiogram revealed no evidence of dissection or thrombus with TIMI-3 flow to the distal vessel.  Intravenous doses of ReoPro were given.  IV heparin was given to maintain the ACT within 200 and 300.  Final orthogonal angiograms reveal less than 10% residual stenosis in the proximal, mid and distal RCA stenotic lesion with TIMI-3 flow to the distal vessel and no evidence of dissection or thrombus.  At this point we elected to complete the procedure.  All balloons, wires, and catheters removed. Hemostatic sheaths were sewn in place.  The patient was transferred back to the ward in stable condition.  CONCLUSIONS: 1. Successful percutaneous transluminal coronary balloon angioplasty and    placement of a NIR Elite 2.5 x 18 mm coronary stent in the distal right    coronary artery. 2. Successful percutaneous transluminal coronary balloon angioplasty and    placement of a NIR Elite 3.0 x 12 mm coronary stent in the mid right    coronary artery stenotic lesion. 3. Successful  percutaneous transluminal coronary balloon angioplasty and     placement of a NIR Elite 3.0 x 9 mm coronary stent in the proximal    right coronary artery stenotic lesion. 4. Adjunct use of ReoPro (abciximab) infusion. 5. Systemic hypertension. 6. No evidence of significant renal artery stenosis. DD:  05/18/00 TD:  05/19/00 Job: [id number]

## 2010-10-18 NOTE — Discharge Summary (Signed)
[place]. [facility]  Patient:    Nancy Liu, Nancy Liu                      MRN: [id number] Adm. Date:  [id number] Disc. Date: [id number] Attending:  Beverely Low                           Discharge Summary  HISTORY:  The patient is a 71 year old female who was seen in the office two days ago with elevated blood pressure.  We treated her in the office and were able to get her blood pressure down.  She called and said that she had had loss of consciousness the day before, and we had her come into the office where we evaluated her.  At that time, blood pressure was 144/75 and 119/75. We were concerned about the loss of consciousness and hypertension, and she complained of low back pain, and this hospitalization was felt to be appropriate.  HOSPITAL COURSE:  She was admitted to the hospital with severe low back pain. X-rays were done.  Renal ultrasound was also done.  A 2-D echocardiogram showed normal left ventricular function with left ventricular hypertrophy. Renal ultrasounds were negative.  Pulse rate was at 50.  Blood pressure was 120/70.  She was on Toprol, which we decreased.  There was concern though with the problems that she was having, and we got a consultation with cardiology. Cardiology saw the patient and felt that because she had had some exertional pain, that we needed to go ahead and do a catheterization on her. A catheterization was done, and catheterization showed that she had right coronary artery stenosis at the proximal mid and distal.  The areas were stented with success.  The patient did well after the procedures, and it was felt that she could be discharged from the hospital and be seen in the office in a couple of days.  CONDITION ON DISCHARGE:  We discharged her from the hospital in an improved state.  Her bradycardia had improved tremendously.  DISCHARGE MEDICATIONS: 1. Tiazac 240 mg q.d. 2. Toprol 12.5 mg q.d. 3. Avapro  312.5. 4. Protonix 40 mg. 5. Zocor 40 mg. 6. Celebrex 200 mg 1 twice a day. 7. Plavix 75 mg a day. 8. Aspirin 1 a day.  She is to be seen in the office in the next 2-3 days. DD:  06/10/00 TD:  06/11/00 Job: [id number]

## 2010-10-18 NOTE — Procedures (Signed)
NAMEROZETTA, STUMPP               ACCOUNT NO.:  [id number]   MEDICAL RECORD NO.:  [id number]          PATIENT TYPE:  REC   LOCATION:  RAD                           FACILITY:  [facility]   PHYSICIAN:  Mcculloh C. Wall, M.D.   DATE OF BIRTH:  12-21-1940   DATE OF PROCEDURE:  DATE OF DISCHARGE:                                    STRESS TEST   CARDIOLOGIST:  Dr. Juanito Doom   INDICATIONS FOR STRESS TEST:  Patient is a 70 year old female with a history  of coronary artery disease status post PTCA and stent x3 to the RCA in  September 2003.  Patient has had dyspnea on exertion which is unchanged x5  years; however, has had recent occurrence of chest tightness/inflammatory  chest discomfort with her previous stents.  Patient also has a recent  diagnosis of diabetes mellitus.   DESCRIPTION OF STUDY:  Type of test performed today was an exercise Myoview.  Patient exercised for a total of 11 minutes and 12 seconds and achieved 12.5  METs.  PA performing the test was April Humphrey, NP.  Indications were for  chest pain.  Symptoms occurring during the stress test include shortness of  breath but no chest pain or chest tightness.  Patient did have some ST  segment depression during the test and patient did achieve her target heart  rate of 133, her resting heart rate was 72, blood pressure was 140/72, max  heart rate was 138, max blood pressure was 192/70.  Worst case ST slope was  -3 in V6 and worst case ST level was -2.7 in V6.  Final images are pending  MD review.     ______________________________  April Humphrey, NP      Jesse Sans. Wall, M.D.  Electronically Signed    AH/MEDQ  D:  05/30/2005  T:  05/30/2005  Job:  [id number]

## 2010-10-18 NOTE — Op Note (Signed)
Nancy Liu, Nancy Liu               ACCOUNT NO.:  [id number]   MEDICAL RECORD NO.:  [id number]          PATIENT TYPE:  AMB   LOCATION:  ENDO                         FACILITY:  [facility]   PHYSICIAN:  Anselmo Rod, M.D.  DATE OF BIRTH:  November 30, 1940   DATE OF PROCEDURE:  05/19/2005  DATE OF DISCHARGE:  05/19/2005                                 OPERATIVE REPORT   PROCEDURE:  Esophagogastroduodenoscopy with biopsies.   ENDOSCOPIST:  Charna Elizabeth, M.D.   INSTRUMENT USED:  Olympus video panendoscope.   INDICATIONS FOR PROCEDURE:  70 year old African American female with a  history of iron deficiency anemia and blood in the stool undergoing EGD to  rule out peptic ulcer disease, esophagitis, gastritis, etc.   PREPROCEDURE PREPARATION:  Informed consent was obtained from the patient.  The patient was fasted for eight hours prior to the procedure.   PREPROCEDURE PHYSICAL:  Patient with stable vital signs.  Neck supple.  Chest clear to auscultation.  S1 and S2 regular.  Abdomen soft with normal  bowel sounds.   DESCRIPTION OF PROCEDURE:  The patient was placed in the left lateral  decubitus position, sedated with 50 mcg of Fentanyl and 5 mg Versed in slow  incremental doses.  Once the patient was adequately sedated, maintained on  low flow oxygen and continuous cardiac monitoring, the Olympus video  panendoscope was advanced through the mouth piece over the tongue into the  esophagus under direct vision.  The entire esophagus appeared normal with no  evidence of ring, strictures, masses, esophagitis, or Barrett's mucosa.  The  scope was then advanced into the stomach.  A small hiatal hernia was seen on  high retroflexion.  Diffuse gastritis was noted throughout the gastric  mucosa, biopsies were done from the antrum to rule out the presence of H.  pylori by pathology.  There is a questionable erythematous spot in the  duodenal bulb, question AVM versus erosion.  Small bowel biopsies were  done  to rule out sprue.  The patient tolerated the procedure well without  complications.   IMPRESSION:  1.  Normal appearing esophagus.  2.  Small hiatal hernia.  3.  Diffuse gastritis, antral biopsies done to rule out H. pylori by      pathology.  4.  Erosions in the duodenal bulb with the question AVM, no evidence of      bleeding, not ablated.  5.  Small bowel biopsies done to rule out sprue.   RECOMMENDATIONS:  1.  Await pathology results.  2.  Avoid nonsteroidals including aspirin for now.  3.  Samples of PPIs will be given to the patient from the office.  4.  Treat with antibiotics if H. pylori present on biopsies.  5.  Proceed with a colonoscopy at this time, further recommendations were      made thereafter.      Anselmo Rod, M.D.  Electronically Signed     JNM/MEDQ  D:  05/22/2005  T:  05/23/2005  Job:  [id number]   cc:   Renaye Rakers, M.D.  Fax: [phone]

## 2010-10-18 NOTE — Procedures (Signed)
[place]. [facility]  Patient:    Nancy Liu                       MRN: [id number] Proc. Date: 04/30/99 Adm. Date:  [id number] Attending:  Sharyn Dross                           Procedure Report  PREOPERATIVE DIAGNOSIS:  Gastroesophageal reflux problems.  POSTOPERATIVE DIAGNOSES: 1. Inflammation with cardia area. 2. Gastritis in the gastric body.  PROCEDURE:  Esophagogastroduodenoscopy.  ENDOSCOPIST:  Dortha Kern, Montez Hageman., M.D.  MEDICATIONS:  Demerol 40 mg IV, Versed 4 mg IV over 10 minute period of time.   INSTRUMENT:  Olympus video panendoscope.  INDICATIONS:  This pleasant 70 year old female is referred for an evaluation because of persistent gastroesophageal reflux symptoms at this time.  Patient complains of a lot of heartburn pains and discomforts as noted at this time. She has had intermittent difficulties of swallowing, no history of any hematemesis,  melena, hematochezia as noted.  OBJECTIVE FINDINGS:  She is a pleasant female, who appears to be in no distress. Her vital signs are stable.  HEENT:  Anicteric.  NECK:  Supple.  LUNGS:  Clear.  HEART:  Regular rate and rhythm without heaves, thrills, murmurs or gallops.  ABDOMEN: Soft, no tenderness, no hepatosplenomegaly.  EXTREMITIES:  Unremarkable.  PLAN:  Will proceed with the endoscopic examination.  INFORMED CONSENT:  The patient was advised of procedure, indications and the risk involved.  The patient has agreed to have the procedure performed.  PREOPERATIVE PREPARATION:  The patient was brought in the endoscopy unit with an IV, for IV conscious sedating medication was started.  A monitor is placed on the patient to monitor the patients vital signs and oxygen saturation.  Nasal oxygen at 2 L/min was used and after adequate sedation was performed, the procedure was begun.  PROCEDURE NOTE:  The instrument was advanced, the patient lying in the  left lateral position via direct technique without difficulty.  The oropharyngeal, epiglottis, vocal cords and piriform sinuses appeared to be grossly within normal limits. he esophagus is normal without any evidence of acute inflammation, ulcerations, hiatal hernia or varices appreciated.  There was evidence of some mild distal esophageal inflammation that was appreciated at this time but no evidence of any erythematous changes noted.  The gastric area showed a normal mucosa lake with evidence of gastritis changes in the gastric body that was noted at this time.  The antral area appeared to be unremarkable with mild inflammation that was noted at this time.  Photographs were taken of the region that was appreciated.  The pylorus is normal with good peristaltic activity and upon advancing through the pyloric canal, the duodenal  canal and second portion appeared to be within normal limits.  The instrument was retracted back and upon retracting the instrument back, there appeared to be a nodule, which was normal color and character at this time and it almost looked ike the ampulla of Vater that was appreciated, but in a different location as noted. There appeared to be no surrounding inflammatory changes around this region at his time.  The instrument was subsequently retracted back into the gastric lumen that was noted.  Again, inflammatory changes were appreciated.  Retroflexed view of he cardia revealed evidence of an erosion that was present around the cardia region at this time, but otherwise no other ulcerations or  bleeding that was noted. Funduscopic examination appeared to be unremarkable.  The instrument was retracted back but measuring of the Z-line could not be done due to patient agitation at his time.  Thus, the instrument was removed per orum without with the patient tolerating the procedure well.  TREATMENT: 1. I am going to start the patient with a PPI  ______. 2. Have follow-up in the office.  The patient may need to be on appropriate    medication to help with ______ at this time.  But, presently will be    conservative until I reevaluate her in the office in the next few weeks.    Depending upon these results, will determine the course of therapy. DD:  04/30/99 TD:  04/30/99 Job: [id number]

## 2011-02-07 ENCOUNTER — Other Ambulatory Visit (HOSPITAL_COMMUNITY): Payer: Self-pay | Admitting: Family Medicine

## 2011-02-07 ENCOUNTER — Encounter: Payer: Self-pay | Admitting: Physician Assistant

## 2011-02-07 ENCOUNTER — Ambulatory Visit (INDEPENDENT_AMBULATORY_CARE_PROVIDER_SITE_OTHER): Payer: Medicare Other | Admitting: Physician Assistant

## 2011-02-07 DIAGNOSIS — I10 Essential (primary) hypertension: Secondary | ICD-10-CM

## 2011-02-07 DIAGNOSIS — E785 Hyperlipidemia, unspecified: Secondary | ICD-10-CM

## 2011-02-07 DIAGNOSIS — I2510 Atherosclerotic heart disease of native coronary artery without angina pectoris: Secondary | ICD-10-CM

## 2011-02-07 DIAGNOSIS — Z139 Encounter for screening, unspecified: Secondary | ICD-10-CM

## 2011-02-07 DIAGNOSIS — R079 Chest pain, unspecified: Secondary | ICD-10-CM | POA: Insufficient documentation

## 2011-02-07 DIAGNOSIS — R0989 Other specified symptoms and signs involving the circulatory and respiratory systems: Secondary | ICD-10-CM

## 2011-02-07 MED ORDER — NITROGLYCERIN 0.4 MG SL SUBL: 0.4000 mg | SUBLINGUAL_TABLET | SUBLINGUAL | Status: DC | PRN

## 2011-02-07 MED ORDER — ROSUVASTATIN CALCIUM 40 MG PO TABS: 40.0000 mg | ORAL_TABLET | Freq: Every day | ORAL | Status: DC

## 2011-02-07 MED ORDER — METOPROLOL SUCCINATE ER 50 MG PO TB24: 50.0000 mg | ORAL_TABLET | Freq: Every day | ORAL | Status: DC

## 2011-02-07 NOTE — Assessment & Plan Note (Signed)
History of bare-metal stent in 2001 with residual 50% diagonal and mid LAD. Stress Myoview has been ordered.

## 2011-02-07 NOTE — Assessment & Plan Note (Signed)
Patient states Dr. Parke Simmers checked her cholesterol 3 months ago and adjusted her medications. She has a followup next month.

## 2011-02-07 NOTE — Progress Notes (Signed)
HPI:  This is a 70 year old African American female patient who has history of coronary artery disease status post bare-metal stent to the RCA in 12/01 with residual 50% lesion in the first diagonal mid LAD. She is here for her yearly follow-up. She complains of dyspnea on exertion when going up and down the stairs as well as an associated mild chest tightness. She stops to rest and it goes away. She also notices this when she takes a garbage down and comes back up her driveway. This has been going on for a couple months. She has not used nitroglycerin. No Known Allergies  No current outpatient prescriptions on file prior to visit.    Past Medical History  Diagnosis Date  . Hypertension   . Carotid artery occlusion   . Coronary artery disease   . Hyperlipidemia     Past Surgical History  Procedure Date  . Cardiac catheterization     bare metal stent placed in the right coronary artery in 12/01 with residual 50% lesion of the first diagonal and mid LAD  . Vaginal hysterectomy and unilateral oophorectomy in 1980   . Colonoscopy     No family history on file.  History   Social History  . Marital Status: Divorced    Spouse Name: N/A    Number of Children: N/A  . Years of Education: N/A   Occupational History  . Not on file.   Social History Main Topics  . Smoking status: Never Smoker   . Smokeless tobacco: Not on file  . Alcohol Use: Not on file  . Drug Use: Not on file  . Sexually Active: Not on file   Other Topics Concern  . Not on file   Social History Narrative  . No narrative on file    ROS: See HPI Eyes: Negative Ears:Negative for hearing loss, tinnitus Cardiovascular: Negative for  palpitations,irregular heartbeat,  near-syncope, orthopnea, paroxysmal nocturnal dyspnia and syncope,edema, claudication, cyanosis,.  Respiratory:   Negative for cough, hemoptysis, shortness of breath, sleep disturbances due to breathing, sputum production and wheezing.     Endocrine: Negative for cold intolerance and heat intolerance.  Hematologic/Lymphatic: Negative for adenopathy and bleeding problem. Does not bruise/bleed easily.  Musculoskeletal: low back pain with known disc trouble.   Gastrointestinal: Negative for nausea, vomiting, reflux, abdominal pain, diarrhea, constipation.   Neurological: Negative.  Allergic/Immunologic: Negative for environmental allergies.   PHYSICAL EXAM: Well-nournished, in no acute distress. Neck: soft bilateral carotid bruits,No JVD, HJR,  or thyroid enlargement Lungs: No tachypnea, clear without wheezing, rales, or rhonchi Cardiovascular: RRR, PMI not displaced, heart sounds normal, no murmurs, gallops, bruit, thrill, or heave. Abdomen: BS normal. Soft without organomegaly, masses, lesions or tenderness. Extremities: without cyanosis, clubbing or edema. Good distal pulses bilateral SKin: Warm, no lesions or rashes  Musculoskeletal: No deformities Neuro: no focal signs  BP 162/70  Pulse 67  Resp 18  Ht 5\' 3"  (1.6 m)  Wt 179 lb (81.194 kg)  BMI 31.71 kg/m2  SpO2 96%  NWG:NFAOZH sinus rhythm with nonspecific T wave abnormality

## 2011-02-07 NOTE — Assessment & Plan Note (Signed)
Patient has recent complaints of exertional dyspnea and chest tightness with going up and down the stairs or carrying her garbage out. This has been going on for several months. With her history of bare-metal stent in 2001 and residual coronary artery disease a stress test is warranted. Her last treadmill test was in August 2011 which was negative. We will do a stress Myoview.

## 2011-02-07 NOTE — Assessment & Plan Note (Signed)
Blood pressure is elevated today. Have increased her Toprol to 50 mg daily. She should also follow 2 g sodium diet.

## 2011-02-07 NOTE — Patient Instructions (Signed)
Your physician recommends that you schedule a follow-up appointment in: within the month with Dr Dietrich Pates  Your physician has recommended you make the following change in your medication: INCREASE Metoprolol to 50 mg daily -- new scripts sent for NTG and Crestor  Your physician has requested that you have en exercise stress myoview. For further information please visit [email or web address] Please follow instruction sheet, as given.

## 2011-02-14 ENCOUNTER — Other Ambulatory Visit: Payer: Self-pay | Admitting: Cardiology

## 2011-02-18 ENCOUNTER — Encounter (HOSPITAL_COMMUNITY)
Admission: RE | Admit: 2011-02-18 | Discharge: 2011-02-18 | Disposition: A | Discharge status: Home / Self Care | Payer: Medicare Other | Source: Ambulatory Visit | Attending: Cardiology | Admitting: Cardiology

## 2011-02-18 ENCOUNTER — Encounter (HOSPITAL_COMMUNITY): Payer: Self-pay | Admitting: Cardiology

## 2011-02-18 ENCOUNTER — Ambulatory Visit (INDEPENDENT_AMBULATORY_CARE_PROVIDER_SITE_OTHER): Payer: Medicare Other | Admitting: *Deleted

## 2011-02-18 ENCOUNTER — Ambulatory Visit (HOSPITAL_COMMUNITY)
Admission: RE | Admit: 2011-02-18 | Discharge: 2011-02-18 | Disposition: A | Discharge status: Home / Self Care | Payer: Medicare Other | Source: Ambulatory Visit | Attending: Family Medicine | Admitting: Family Medicine

## 2011-02-18 ENCOUNTER — Encounter (HOSPITAL_COMMUNITY): Payer: Self-pay

## 2011-02-18 ENCOUNTER — Ambulatory Visit (HOSPITAL_COMMUNITY): Payer: Medicare Other

## 2011-02-18 DIAGNOSIS — R0789 Other chest pain: Secondary | ICD-10-CM | POA: Insufficient documentation

## 2011-02-18 DIAGNOSIS — R079 Chest pain, unspecified: Secondary | ICD-10-CM

## 2011-02-18 DIAGNOSIS — Z1231 Encounter for screening mammogram for malignant neoplasm of breast: Secondary | ICD-10-CM | POA: Insufficient documentation

## 2011-02-18 DIAGNOSIS — Z139 Encounter for screening, unspecified: Secondary | ICD-10-CM

## 2011-02-18 DIAGNOSIS — I2510 Atherosclerotic heart disease of native coronary artery without angina pectoris: Secondary | ICD-10-CM | POA: Insufficient documentation

## 2011-02-18 DIAGNOSIS — I10 Essential (primary) hypertension: Secondary | ICD-10-CM | POA: Insufficient documentation

## 2011-02-18 DIAGNOSIS — E785 Hyperlipidemia, unspecified: Secondary | ICD-10-CM | POA: Insufficient documentation

## 2011-02-18 MED ORDER — TECHNETIUM TC 99M TETROFOSMIN IV KIT
10.0000 | PACK | Freq: Once | INTRAVENOUS | Status: AC | PRN
  Administered 2011-02-18: 9.9 via INTRAVENOUS

## 2011-02-18 MED ORDER — TECHNETIUM TC 99M TETROFOSMIN IV KIT
30.0000 | PACK | Freq: Once | INTRAVENOUS | Status: AC | PRN
  Administered 2011-02-18: 30 via INTRAVENOUS

## 2011-02-18 NOTE — Progress Notes (Signed)
Stress Lab Nurses Notes - [facility]  BREEAN NANNINI 02/18/2011  Reason for doing test: CAD and Chest Pain  Type of test: Stress Myoview and Test Changed Lexiscan Myoview given / Unable to reach [facility]  Nurse performing test: Parke Poisson, RN  Nuclear Medicine Tech: Lou Cal  Echo Tech: Not Applicable  MD performing test: Ival Bible & Joni Reining NP  Family MD: Parke Simmers  Test explained and consent signed: yes  IV started: 22g jelco, Saline lock flushed, No redness or edema and Saline lock started in radiology  Symptoms: chest pressure and stomach discomfort.  Treatment/Intervention: None  Reason test stopped: protocol completed  After recovery IV was: Discontinued via X-ray tech and No redness or edema  Patient to return to Nuc. Med at :  11:40 am  Patient discharged: Home  Patient's Condition upon discharge was: stable  Comments: during test BP 168/60 & HR 126.  Recovery BP 120/62 & HR 94.  Symptoms resolved in recovery.  Erskine Speed T

## 2011-02-19 ENCOUNTER — Encounter: Payer: Self-pay | Admitting: *Deleted

## 2011-02-24 ENCOUNTER — Other Ambulatory Visit (HOSPITAL_COMMUNITY): Payer: Self-pay | Admitting: Physical Medicine and Rehabilitation

## 2011-02-24 ENCOUNTER — Ambulatory Visit (HOSPITAL_COMMUNITY)
Admission: RE | Admit: 2011-02-24 | Discharge: 2011-02-24 | Disposition: A | Discharge status: Home / Self Care | Payer: Medicare Other | Source: Ambulatory Visit | Attending: Physical Medicine and Rehabilitation | Admitting: Physical Medicine and Rehabilitation

## 2011-02-24 DIAGNOSIS — M5126 Other intervertebral disc displacement, lumbar region: Secondary | ICD-10-CM | POA: Insufficient documentation

## 2011-02-24 DIAGNOSIS — M545 Low back pain, unspecified: Secondary | ICD-10-CM | POA: Insufficient documentation

## 2011-02-24 DIAGNOSIS — R209 Unspecified disturbances of skin sensation: Secondary | ICD-10-CM | POA: Insufficient documentation

## 2011-02-24 DIAGNOSIS — R200 Anesthesia of skin: Secondary | ICD-10-CM

## 2011-02-24 DIAGNOSIS — R531 Weakness: Secondary | ICD-10-CM

## 2011-03-10 ENCOUNTER — Encounter: Payer: Self-pay | Admitting: Cardiology

## 2011-03-10 ENCOUNTER — Ambulatory Visit (INDEPENDENT_AMBULATORY_CARE_PROVIDER_SITE_OTHER): Payer: Medicare Other | Admitting: Cardiology

## 2011-03-10 DIAGNOSIS — D649 Anemia, unspecified: Secondary | ICD-10-CM

## 2011-03-10 DIAGNOSIS — I200 Unstable angina: Secondary | ICD-10-CM | POA: Insufficient documentation

## 2011-03-10 DIAGNOSIS — I10 Essential (primary) hypertension: Secondary | ICD-10-CM

## 2011-03-10 DIAGNOSIS — I2510 Atherosclerotic heart disease of native coronary artery without angina pectoris: Secondary | ICD-10-CM

## 2011-03-10 DIAGNOSIS — R0989 Other specified symptoms and signs involving the circulatory and respiratory systems: Secondary | ICD-10-CM

## 2011-03-10 DIAGNOSIS — E785 Hyperlipidemia, unspecified: Secondary | ICD-10-CM

## 2011-03-10 DIAGNOSIS — I744 Embolism and thrombosis of arteries of extremities, unspecified: Secondary | ICD-10-CM

## 2011-03-10 DIAGNOSIS — I743 Embolism and thrombosis of arteries of the lower extremities: Secondary | ICD-10-CM

## 2011-03-10 NOTE — Progress Notes (Signed)
HPI : Nancy Liu returns to the office as scheduled for continued assessment and treatment of coronary disease.  Since her last visit, she underwent a stress nuclear study that was negative with a normal EF.  She does not follow blood pressures at home.  Diabetic control has been good.  Current Outpatient Prescriptions on File Prior to Visit  Medication Sig Dispense Refill  . amLODipine (NORVASC) 10 MG tablet Take 10 mg by mouth daily.        Marland Kitchen aspirin 81 MG tablet Take 81 mg by mouth daily.        . metFORMIN (GLUMETZA) 1000 MG (MOD) 24 hr tablet Take 500 mg by mouth daily. take 1/2 tablet daily.       . metoprolol (TOPROL XL) 50 MG 24 hr tablet Take 1 tablet (50 mg total) by mouth daily.  30 tablet  6  . niacin (NIASPAN) 500 MG CR tablet Take 500 mg by mouth at bedtime.        . nitroGLYCERIN (NITROSTAT) 0.4 MG SL tablet Place 1 tablet (0.4 mg total) under the tongue every 5 (five) minutes as needed for chest pain.  25 tablet  6  . olmesartan-hydrochlorothiazide (BENICAR HCT) 40-25 MG per tablet Take 1 tablet by mouth daily.        . rosuvastatin (CRESTOR) 40 MG tablet Take 1 tablet (40 mg total) by mouth daily.  30 tablet  6  . Vitamin D, Ergocalciferol, (DRISDOL) 50000 UNITS CAPS Take 50,000 Units by mouth every 7 (seven) days.           Allergies  Allergen Reactions  . Shellfish Allergy       Past medical history, social history, and family history reviewed and updated.  ROS: No orthopnea, PND, chest discomfort, ankle edema, dyspnea on exertion, lightheadedness or syncope.  PHYSICAL EXAM: BP 159/84  Pulse 81  Ht 5\' 3"  (1.6 m)  Wt 182 lb (82.555 kg)  BMI 32.24 kg/m2 ; repeat blood pressure 160/80 General-Well developed; no acute distress Body habitus-overweight Neck-No JVD; no carotid bruits Lungs-clear lung fields; resonant to percussion Cardiovascular-normal PMI; normal S1 and S2; grade 2/6 systolic ejection murmur radiating to the carotids Abdomen-normal bowel sounds; soft  and non-tender without masses or organomegaly Musculoskeletal-No deformities, no cyanosis or clubbing Neurologic-Normal cranial nerves; symmetric strength and tone Skin-Warm, no significant lesions; scar over the right forearm Extremities-distal pulses intact; no edema  ASSESSMENT AND PLAN:

## 2011-03-10 NOTE — Patient Instructions (Signed)
Your physician recommends that you continue on your current medications as directed. Please refer to the Current Medication list given to you today.  Your physician recommends that you schedule a follow-up appointment in: 1 year  

## 2011-03-11 NOTE — Assessment & Plan Note (Signed)
Despite a fairly impressive history, stress nuclear study was negative.  Symptoms have improved.  We will continue to assist with management of cardiovascular risk factors, but will not pursue additional diagnostic testing unless substantial symptoms recur. 

## 2011-03-11 NOTE — Assessment & Plan Note (Signed)
Blood pressure control is suboptimal based upon today's value, but patient reports nearly all normal results at home.  She will collect additional blood pressures and return them for our review.

## 2011-03-11 NOTE — Assessment & Plan Note (Signed)
Patient has a minimal anemia with microcytosis (H&H of 11.3/36.3 in 12/2009 and MCV of 76); basic evaluation, at least to exclude iron deficiency, would be appropriate.  That will be left to Dr. Tedra Senegal discretion.

## 2011-03-11 NOTE — Assessment & Plan Note (Signed)
Control of hyperlipidemia is excellent with current therapy, which will be continued.

## 2011-03-11 NOTE — Assessment & Plan Note (Signed)
Despite a fairly impressive history, stress nuclear study was negative.  Symptoms have improved.  We will continue to assist with management of cardiovascular risk factors, but will not pursue additional diagnostic testing unless substantial symptoms recur.

## 2011-12-26 ENCOUNTER — Other Ambulatory Visit: Payer: Self-pay | Admitting: Physician Assistant

## 2011-12-29 MED ORDER — METOPROLOL SUCCINATE ER 50 MG PO TB24: 50.0000 mg | ORAL_TABLET | Freq: Every day | ORAL | Status: DC

## 2011-12-29 NOTE — Addendum Note (Signed)
Addended by: Burnice Logan on: 12/29/2011 08:59 AM   Modules accepted: Orders

## 2012-02-16 ENCOUNTER — Other Ambulatory Visit (HOSPITAL_COMMUNITY): Payer: Self-pay | Admitting: Family Medicine

## 2012-02-16 DIAGNOSIS — Z139 Encounter for screening, unspecified: Secondary | ICD-10-CM

## 2012-02-23 ENCOUNTER — Ambulatory Visit (HOSPITAL_COMMUNITY)
Admission: RE | Admit: 2012-02-23 | Discharge: 2012-02-23 | Disposition: A | Discharge status: Home / Self Care | Payer: Medicare Other | Source: Ambulatory Visit | Attending: Family Medicine | Admitting: Family Medicine

## 2012-02-23 DIAGNOSIS — Z1231 Encounter for screening mammogram for malignant neoplasm of breast: Secondary | ICD-10-CM | POA: Insufficient documentation

## 2012-02-23 DIAGNOSIS — Z139 Encounter for screening, unspecified: Secondary | ICD-10-CM

## 2012-03-23 ENCOUNTER — Ambulatory Visit: Payer: Medicare Other | Admitting: Cardiology

## 2012-03-27 ENCOUNTER — Other Ambulatory Visit: Payer: Self-pay | Admitting: Cardiology

## 2012-04-01 ENCOUNTER — Encounter: Payer: Self-pay | Admitting: Physician Assistant

## 2012-04-01 ENCOUNTER — Ambulatory Visit (INDEPENDENT_AMBULATORY_CARE_PROVIDER_SITE_OTHER): Payer: Medicare Other | Admitting: Physician Assistant

## 2012-04-01 ENCOUNTER — Other Ambulatory Visit: Payer: Self-pay | Admitting: Physician Assistant

## 2012-04-01 VITALS — BP 122/71 | HR 61 | Ht 62.5 in | Wt 168.1 lb

## 2012-04-01 DIAGNOSIS — I10 Essential (primary) hypertension: Secondary | ICD-10-CM

## 2012-04-01 DIAGNOSIS — E785 Hyperlipidemia, unspecified: Secondary | ICD-10-CM

## 2012-04-01 DIAGNOSIS — I7090 Unspecified atherosclerosis: Secondary | ICD-10-CM

## 2012-04-01 DIAGNOSIS — R634 Abnormal weight loss: Secondary | ICD-10-CM

## 2012-04-01 DIAGNOSIS — I2510 Atherosclerotic heart disease of native coronary artery without angina pectoris: Secondary | ICD-10-CM

## 2012-04-01 MED ORDER — HYDROCHLOROTHIAZIDE 25 MG PO TABS: 25.0000 mg | ORAL_TABLET | Freq: Every day | ORAL | Status: DC

## 2012-04-01 MED ORDER — OLMESARTAN MEDOXOMIL 40 MG PO TABS: 40.0000 mg | ORAL_TABLET | Freq: Every day | ORAL | Status: DC

## 2012-04-01 NOTE — Assessment & Plan Note (Signed)
Patient has lost 14 pounds over the past year by cutting portions. She is commended for this and recommend continued weight loss and exercise.

## 2012-04-01 NOTE — Progress Notes (Signed)
HPI:   This is a 71 year old African American female patient who has history of coronary artery disease status post bare-metal stent to the RCA in 12/01 with residual 50% lesion the first I had known and mid LAD. She had a stress Myoview last year when she was having exertional chest tightness. The stress Myoview was negative for ischemia ejection fraction 77%.  The patient has done well since then and denies any trouble with chest pain, palpitations, dyspnea, dyspnea on exertion, dizziness, or presyncope. She says her cholesterol was up about a month ago when Dr. Parke Simmers checked it but she admits to not taking the Crestor regularly because of leg cramps. She has recently resumed the Crestor and so far hasn't had leg cramps. She is also complaining about the cost of Benicar HCT.She does not exercise on a regular basis. She has lost 14 pounds over the past year by trying to cut back on her portions.   Allergies: -- Shellfish Allergy   Current Outpatient Prescriptions on File Prior to Visit: amLODipine (NORVASC) 10 MG tablet, Take 10 mg by mouth daily.  , Disp: , Rfl:  aspirin 81 MG tablet, Take 81 mg by mouth daily.  , Disp: , Rfl:  fenofibrate 160 MG tablet, Take 160 mg by mouth daily., Disp: , Rfl:  metFORMIN (GLUMETZA) 1000 MG (MOD) 24 hr tablet, Take 500 mg by mouth daily. take 1/2 tablet daily. , Disp: , Rfl:  metoprolol succinate (TOPROL-XL) 50 MG 24 hr tablet, Take 1 tablet (50 mg total) by mouth daily. Take with or immediately following a meal., Disp: 30 tablet, Rfl: 6 rosuvastatin (CRESTOR) 40 MG tablet, Take 1 tablet (40 mg total) by mouth daily., Disp: 30 tablet, Rfl: 6 Vitamin D, Ergocalciferol, (DRISDOL) 50000 UNITS CAPS, Take 50,000 Units by mouth every 7 (seven) days.  , Disp: , Rfl:  nitroGLYCERIN (NITROSTAT) 0.4 MG SL tablet, Place 1 tablet (0.4 mg total) under the tongue every 5 (five) minutes as needed for chest pain., Disp: 25 tablet, Rfl: 6    Past Medical History:  Hypertension                                                 Cerebrovascular disease                                      Arteriosclerotic cardiovascular disease (ASCVD) 2001           Comment:Bare-metal stent placed in the right coronary               artery in 12/01; residual 50% lesion of the               first diagonal and mid LAD   Hyperlipidemia                                               Diabetes mellitus  Comment:excellent control with a low-dose of a single               oral agent   Tobacco abuse, in remission                                    Comment:35 pack years; Quit in 1980  Past Surgical History:   VAGINAL HYSTERECTOMY                            1980           Comment:Unilateral oophorectomy   COLONOSCOPY                                     2006         INGUINAL HERNIA REPAIR                          1970s          Comment:Right   DILATION AND CURETTAGE OF UTERUS                            Review of patient's family history indicates:   Heart disease                  Mother                     Comment: also hypertension and asthma   Social History   Marital Status: Divorced            Spouse Name:                      Years of Education:                 Number of children: 3           Occupational History Occupation          Associate Professor            Comment              Retired                                 Marine scientist work  Social History Main Topics   Smoking Status: Former Smoker                   Packs/Day:       Years:           Quit date: 06/09/1980   Smokeless Status: Never Used                       Alcohol Use: No             Drug Use: Not on file    Sexual Activity: Not on file        Other Topics            Concern   None on file  Social History Narrative   None on file    ROS:she complains of increased urination because she is eating ice all day long. She  denies any dysuria or nocturia. Otherwise  see history of present illness   PHYSICAL EXAM: Well-nournished, in no acute distress. Neck: No JVD, HJR, Bruit, or thyroid enlargement  Lungs: No tachypnea, clear without wheezing, rales, or rhonchi  Cardiovascular: RRR, PMI not displaced, positive S4 and 1-2/6 systolic murmur at the left border, no bruit, thrill, or heave.  Abdomen: BS normal. Soft without organomegaly, masses, lesions or tenderness. Normal sinus rhythm normal EKG Extremities: without cyanosis, clubbing or edema. Good distal pulses bilateral  SKin: Warm, no lesions or rashes   Musculoskeletal: No deformities  Neuro: no focal signs  BP 122/71  Pulse 61  Ht 5' 2.5" (1.588 m)  Wt 168 lb 1.9 oz (76.259 kg)  BMI 30.26 kg/m2   LKG:MWNUUV sinus rhythm, no acute change

## 2012-04-01 NOTE — Assessment & Plan Note (Signed)
Patient is doing well without chest pain. She had a negative stress Myoview approximately one year ago.

## 2012-04-01 NOTE — Assessment & Plan Note (Addendum)
Well-controlled.The patient is complaining of the expense of Benicar HCT. We have given her samples of this for about one month and then we will give her prescription for Benicar 40 mg daily and hydrochlorothiazide 25 mg daily to lower the cost.

## 2012-04-01 NOTE — Assessment & Plan Note (Addendum)
Patient's cholesterol has been elevated but she stopped taking her Crestor because of leg cramps. She recently resumed this and so far is not having leg cramps. We have given her samples of Crestor.This is being followed by Dr. Parke Simmers.

## 2012-04-01 NOTE — Patient Instructions (Addendum)
Start taking:  Benicar 40 mg daily.  HCTZ 25 mg daily  Stop: BenicarHCT  Follow-up with Dr. Dietrich Pates in 1 year.

## 2012-04-02 MED ORDER — FENOFIBRATE 160 MG PO TABS: 160.0000 mg | ORAL_TABLET | Freq: Every day | ORAL | Status: DC

## 2013-02-11 ENCOUNTER — Other Ambulatory Visit (HOSPITAL_COMMUNITY): Payer: Self-pay | Admitting: Family Medicine

## 2013-02-11 DIAGNOSIS — Z139 Encounter for screening, unspecified: Secondary | ICD-10-CM

## 2013-02-24 ENCOUNTER — Ambulatory Visit (HOSPITAL_COMMUNITY)
Admission: RE | Admit: 2013-02-24 | Discharge: 2013-02-24 | Disposition: A | Discharge status: Home / Self Care | Payer: Medicare Other | Source: Ambulatory Visit | Attending: Family Medicine | Admitting: Family Medicine

## 2013-02-24 DIAGNOSIS — Z139 Encounter for screening, unspecified: Secondary | ICD-10-CM

## 2013-02-24 DIAGNOSIS — Z1231 Encounter for screening mammogram for malignant neoplasm of breast: Secondary | ICD-10-CM | POA: Insufficient documentation

## 2013-03-25 ENCOUNTER — Telehealth: Payer: Self-pay | Admitting: Physician Assistant

## 2013-03-25 MED ORDER — FENOFIBRATE 160 MG PO TABS: 160.0000 mg | ORAL_TABLET | Freq: Every day | ORAL | Status: DC

## 2013-03-25 NOTE — Telephone Encounter (Signed)
Received fax refill request  Rx # [id number] Medication:  Fenofibrate 160mg  tab Qty 30 Sig:  Take one tablet by mouth every day Physician:  Suezanne Cheshire

## 2013-03-25 NOTE — Telephone Encounter (Signed)
rx sent to pharmacy by e-script to last pt until upcoming apt with Dr Wyline Mood 04-06-13

## 2013-04-06 ENCOUNTER — Ambulatory Visit (INDEPENDENT_AMBULATORY_CARE_PROVIDER_SITE_OTHER): Payer: Medicare Other | Admitting: Cardiology

## 2013-04-06 ENCOUNTER — Encounter: Payer: Self-pay | Admitting: Cardiology

## 2013-04-06 VITALS — BP 139/86 | HR 64 | Ht 62.0 in | Wt 180.0 lb

## 2013-04-06 DIAGNOSIS — I10 Essential (primary) hypertension: Secondary | ICD-10-CM

## 2013-04-06 DIAGNOSIS — R0602 Shortness of breath: Secondary | ICD-10-CM

## 2013-04-06 DIAGNOSIS — I2510 Atherosclerotic heart disease of native coronary artery without angina pectoris: Secondary | ICD-10-CM

## 2013-04-06 MED ORDER — CHLORTHALIDONE 25 MG PO TABS: 25.0000 mg | ORAL_TABLET | Freq: Every day | ORAL | Status: DC

## 2013-04-06 NOTE — Progress Notes (Signed)
Clinical Summary Ms. Behe is a 72 y.o.female seen today for follow up   1. CAD - prior BMS to RCA in 2001 at [facility] - 02/2011 MPI no ischemia  - denies any recent chest pain. Describes some dyspnea at 3-4 blocks which is fairly recent. No orthopnea, no PND, occas LE edema.  - compliant with meds: ASA, Toprol XL, olmesartan, crestor  2. HTN - checks bp at home 2-3 times a week. Typically around 130s/80s - compliant with meds  3. Hyperlpidiemia - compliant with meds - followed by PCP, no recent labs in our panel  Past Medical History  Diagnosis Date  . Hypertension   . Cerebrovascular disease   . Arteriosclerotic cardiovascular disease (ASCVD) 2001    Bare-metal stent placed in the right coronary artery in 12/01; residual 50% lesion of the first diagonal and mid LAD  . Hyperlipidemia   . Diabetes mellitus     excellent control with a low-dose of a single oral agent  . Tobacco abuse, in remission     35 pack years; Quit in 1980     Allergies  Allergen Reactions  . Shellfish Allergy      Current Outpatient Prescriptions  Medication Sig Dispense Refill  . amLODipine (NORVASC) 10 MG tablet Take 10 mg by mouth daily.        Marland Kitchen aspirin 81 MG tablet Take 81 mg by mouth daily.        . fenofibrate 160 MG tablet Take 1 tablet (160 mg total) by mouth daily.  30 tablet  0  . hydrochlorothiazide (HYDRODIURIL) 25 MG tablet Take 1 tablet (25 mg total) by mouth daily.  30 tablet  11  . metFORMIN (GLUMETZA) 1000 MG (MOD) 24 hr tablet Take 500 mg by mouth daily. take 1/2 tablet daily.       . metoprolol succinate (TOPROL-XL) 50 MG 24 hr tablet Take 1 tablet (50 mg total) by mouth daily. Take with or immediately following a meal.  30 tablet  6  . nitroGLYCERIN (NITROSTAT) 0.4 MG SL tablet Place 1 tablet (0.4 mg total) under the tongue every 5 (five) minutes as needed for chest pain.  25 tablet  6  . olmesartan (BENICAR) 40 MG tablet Take 1 tablet (40 mg total) by mouth daily.   30 tablet  11  . rosuvastatin (CRESTOR) 40 MG tablet Take 1 tablet (40 mg total) by mouth daily.  30 tablet  6  . Vitamin D, Ergocalciferol, (DRISDOL) 50000 UNITS CAPS Take 50,000 Units by mouth every 7 (seven) days.         No current facility-administered medications for this visit.     Past Surgical History  Procedure Laterality Date  . Vaginal hysterectomy  1980    Unilateral oophorectomy  . Colonoscopy  2006  . Inguinal hernia repair  1970s    Right  . Dilation and curettage of uterus       Allergies  Allergen Reactions  . Shellfish Allergy       Family History  Problem Relation Age of Onset  . Heart disease Mother     also hypertension and asthma     Social History Ms. Vanrossum reports that she quit smoking about 32 years ago. She has never used smokeless tobacco. Ms. Vegh reports that she does not drink alcohol.   Review of Systems CONSTITUTIONAL: No weight loss, fever, chills, weakness or fatigue.  HEENT: Eyes: No visual loss, blurred vision, double vision or yellow sclerae.No  hearing loss, sneezing, congestion, runny nose or sore throat.  SKIN: No rash or itching.  CARDIOVASCULAR: per HPI RESPIRATORY: per HPI GASTROINTESTINAL: No anorexia, nausea, vomiting or diarrhea. No abdominal pain or blood.  GENITOURINARY: No burning on urination, no polyuria NEUROLOGICAL: No headache, dizziness, syncope, paralysis, ataxia, numbness or tingling in the extremities. No change in bowel or bladder control.  MUSCULOSKELETAL: No muscle, back pain, joint pain or stiffness.  LYMPHATICS: No enlarged nodes. No history of splenectomy.  PSYCHIATRIC: No history of depression or anxiety.  ENDOCRINOLOGIC: No reports of sweating, cold or heat intolerance. No polyuria or polydipsia.  Marland Kitchen   Physical Examination p 64 bp 139/86 Wt 180 lbs BMI 33 Gen: resting comfortably, no acute distress HEENT: no scleral icterus, pupils equal round and reactive, no palptable cervical adenopathy,    CV: RRR, no m/r/g, no JVD, no carotid bruits Resp: Clear to auscultation bilaterally GI: abdomen is soft, non-tender, non-distended, normal bowel sounds, no hepatosplenomegaly MSK: extremities are warm, no edema.  Skin: warm, no rash Neuro:  no focal deficits Psych: appropriate affect   Diagnostic Studies 02/2011 MPI Low risk exercise/Lexiscan Myoview as outlined. Equivocal ST- segment changes were noted in the setting of lead motion artifact. No chest pain reported. Perfusion imaging is most consistent with breast attenuation without clear evidence of scar or ischemia. LVEF 77%.   04/06/13 Clinic EKG: sinus rhythm, nomral axis, normal intervals, non-specific ST/T changes   Assessment and Plan  1. CAD - denies any current chest pain. Does describe some DOE at 3-4 blocks that has developed over the last several months, she attributes it to being out of shape. - will check a 2D echo to evaluate LV function in setting of known CAD and some fairly new symptoms of DOE  2. HTN - not at goal given her history of diabetes, (goal <130/80). She is already on multiple bp agents - change HCTZ to chlorthalidone for more potent diuretic, can titrate up to 50mg  daily at follow up if needed for further control  3. Hyperlipidemia - no recent panel on our system, will ask for labs from her pcp - continue current meds  F/u 3 months      Antoine Poche, M.D., F.A.C.C.

## 2013-04-06 NOTE — Patient Instructions (Addendum)
Your physician recommends that you schedule a follow-up appointment in: 3 months  Your physician has requested that you have an echocardiogram. Echocardiography is a painless test that uses sound waves to create images of your heart. It provides your doctor with information about the size and shape of your heart and how well your heart's chambers and valves are working. This procedure takes approximately one hour. There are no restrictions for this procedure.  WE WILL CALL YOU WITH YOUR TEST RESULTS/INSTRUCTIONS/NEXT STEPS ONCE RECEIVED BY THE PROVIDER  PLEASE BE ADVISED YOU WILL STILL NEED TO KEEP YOUR FOLLOW UP APPOINTMENT TO DISCUSS FURTHER DETAILS OF YOUR TEST RESULTS/NEXT STEPS/FUTURE PLAN OF CARE WITH YOUR PROVIDER DESPITE THE FACT THAT YOU MAY HAVE NORMAL TEST RESULTS   Your physician has recommended you make the following change in your medication:   1) STOP HCTZ (HYDROCHLORATHIZIDE)  2) START TAKING CHLORTHALIDONE 25MG  ONCE DAILY  3) START TAKING BENICAR 40MG  ONCE DAILY (SAMPLES GIVEN-ONCE YOU RUN OUT OF SAMPLES AND NEED A PRESCRIPTION CALLED IN CALL OUR OFFICE AT [phone]

## 2013-04-19 ENCOUNTER — Ambulatory Visit (HOSPITAL_COMMUNITY)
Admission: RE | Admit: 2013-04-19 | Discharge: 2013-04-19 | Disposition: A | Discharge status: Home / Self Care | Payer: Medicare Other | Source: Ambulatory Visit | Attending: Cardiology | Admitting: Cardiology

## 2013-04-19 DIAGNOSIS — I517 Cardiomegaly: Secondary | ICD-10-CM

## 2013-04-19 DIAGNOSIS — R0609 Other forms of dyspnea: Secondary | ICD-10-CM | POA: Insufficient documentation

## 2013-04-19 DIAGNOSIS — I2510 Atherosclerotic heart disease of native coronary artery without angina pectoris: Secondary | ICD-10-CM | POA: Insufficient documentation

## 2013-04-19 DIAGNOSIS — R0602 Shortness of breath: Secondary | ICD-10-CM

## 2013-04-19 DIAGNOSIS — Z6832 Body mass index (BMI) 32.0-32.9, adult: Secondary | ICD-10-CM | POA: Insufficient documentation

## 2013-04-19 DIAGNOSIS — E785 Hyperlipidemia, unspecified: Secondary | ICD-10-CM | POA: Insufficient documentation

## 2013-04-19 DIAGNOSIS — E119 Type 2 diabetes mellitus without complications: Secondary | ICD-10-CM | POA: Insufficient documentation

## 2013-04-19 DIAGNOSIS — I10 Essential (primary) hypertension: Secondary | ICD-10-CM | POA: Insufficient documentation

## 2013-04-19 DIAGNOSIS — R0989 Other specified symptoms and signs involving the circulatory and respiratory systems: Secondary | ICD-10-CM | POA: Insufficient documentation

## 2013-04-19 NOTE — Progress Notes (Signed)
*  PRELIMINARY RESULTS* Echocardiogram 2D Echocardiogram has been performed.  Tyliah Schlereth 04/19/2013, 12:28 PM

## 2013-05-10 ENCOUNTER — Encounter: Payer: Self-pay | Admitting: Cardiology

## 2013-05-16 ENCOUNTER — Telehealth: Payer: Self-pay | Admitting: Physician Assistant

## 2013-05-16 DIAGNOSIS — I10 Essential (primary) hypertension: Secondary | ICD-10-CM

## 2013-05-16 MED ORDER — HYDROCHLOROTHIAZIDE 25 MG PO TABS: 25.0000 mg | ORAL_TABLET | Freq: Every day | ORAL | Status: DC

## 2013-05-16 NOTE — Telephone Encounter (Signed)
Received fax refill request ° °Rx # [id number] °Medication:  Fenofibrate 160 mg tab °Qty 30 °Sig:  Take one tablet by mouth once daily °Physician:  Lenz ° ° °

## 2013-05-16 NOTE — Telephone Encounter (Signed)
Received fax refill request  Rx # [id number] Medication:  Hydrodiuril 25 mg tab Qty 30 Sig:  Take one tablet by mouth every day Physician:  Suezanne Cheshire

## 2013-05-19 ENCOUNTER — Other Ambulatory Visit: Payer: Self-pay

## 2013-05-19 ENCOUNTER — Telehealth: Payer: Self-pay | Admitting: Physician Assistant

## 2013-05-19 MED ORDER — FENOFIBRATE 160 MG PO TABS: 160.0000 mg | ORAL_TABLET | Freq: Every day | ORAL | Status: DC

## 2013-05-19 NOTE — Telephone Encounter (Signed)
Received fax refill request  Rx # [id number] Medication:  Fenofibrate 160 mg tab Qty 30 Sig:  Take one tablet by mouth once daily Physician:  Suezanne Cheshire

## 2013-05-19 NOTE — Telephone Encounter (Signed)
rx to pharmacy fenofibrate

## 2013-05-19 NOTE — Telephone Encounter (Signed)
Received fax refill request  Rx # [id number]  Medication:  Hydrodiuril 25 mg tab Qty 30 Sig:  Take one tablet by mouth every day Physician:  Geni Bers

## 2013-07-10 NOTE — Progress Notes (Signed)
Clinical Summary Nancy Liu is a 73 y.o.female seen today for follow up of the following medical problems.  1. CAD  - prior BMS to RCA in 2001 at [facility]  - 02/2011 MPI no ischemia  - denies any recent chest pain. No orthopnea, no PND, occas LE edema.  - compliant with meds: ASA, Toprol XL, olmesartan, crestor  - feels Toprol might be causing leg cramps, states she had cramps and stopped taking Toprol and they resolved. Restarted taking and they returned - since last visit she completed echo, showed LVEF 60-65% with grade I diastolic dysfunction. She had noted some mild DOE.   2. HTN  - checks bp at home 2-3 times a week. Typically around 140/80 prior to taking meds  - compliant with meds   3. Hyperlpidiemia  - compliant with meds  - followed by PCP, no recent panel in our system   Past Medical History  Diagnosis Date  . Hypertension   . Cerebrovascular disease   . Arteriosclerotic cardiovascular disease (ASCVD) 2001    Bare-metal stent placed in the right coronary artery in 12/01; residual 50% lesion of the first diagonal and mid LAD  . Hyperlipidemia   . Diabetes mellitus     excellent control with a low-dose of a single oral agent  . Tobacco abuse, in remission     35 pack years; Quit in 1980     Allergies  Allergen Reactions  . Shellfish Allergy      Current Outpatient Prescriptions  Medication Sig Dispense Refill  . amLODipine (NORVASC) 10 MG tablet Take 10 mg by mouth daily.        Marland Kitchen aspirin 81 MG tablet Take 81 mg by mouth daily.        . chlorthalidone (HYGROTON) 25 MG tablet Take 1 tablet (25 mg total) by mouth daily.  90 tablet  1  . fenofibrate 160 MG tablet Take 1 tablet (160 mg total) by mouth daily.  30 tablet  6  . hydrochlorothiazide (HYDRODIURIL) 25 MG tablet Take 1 tablet (25 mg total) by mouth daily.  30 tablet  11  . metFORMIN (GLUMETZA) 1000 MG (MOD) 24 hr tablet Take 500 mg by mouth daily. take 1/2 tablet daily.       . metoprolol  succinate (TOPROL-XL) 50 MG 24 hr tablet Take 1 tablet (50 mg total) by mouth daily. Take with or immediately following a meal.  30 tablet  6  . nitroGLYCERIN (NITROSTAT) 0.4 MG SL tablet Place 1 tablet (0.4 mg total) under the tongue every 5 (five) minutes as needed for chest pain.  25 tablet  6  . olmesartan (BENICAR) 40 MG tablet Take 1 tablet (40 mg total) by mouth daily.  30 tablet  11  . rosuvastatin (CRESTOR) 40 MG tablet Take 1 tablet (40 mg total) by mouth daily.  30 tablet  6  . Vitamin D, Ergocalciferol, (DRISDOL) 50000 UNITS CAPS Take 50,000 Units by mouth every 7 (seven) days.         No current facility-administered medications for this visit.     Past Surgical History  Procedure Laterality Date  . Vaginal hysterectomy  1980    Unilateral oophorectomy  . Colonoscopy  2006  . Inguinal hernia repair  1970s    Right  . Dilation and curettage of uterus       Allergies  Allergen Reactions  . Shellfish Allergy       Family History  Problem Relation Age  of Onset  . Heart disease Mother     also hypertension and asthma     Social History Ms. Grabinski reports that she quit smoking about 33 years ago. She has never used smokeless tobacco. Ms. Muller reports that she does not drink alcohol.   Review of Systems CONSTITUTIONAL: No weight loss, fever, chills, weakness or fatigue.  HEENT: Eyes: No visual loss, blurred vision, double vision or yellow sclerae.No hearing loss, sneezing, congestion, runny nose or sore throat.  SKIN: No rash or itching.  CARDIOVASCULAR: per HPI RESPIRATORY: No shortness of breath, cough or sputum.  GASTROINTESTINAL: No anorexia, nausea, vomiting or diarrhea. No abdominal pain or blood.  GENITOURINARY: No burning on urination, no polyuria NEUROLOGICAL: No headache, dizziness, syncope, paralysis, ataxia, numbness or tingling in the extremities. No change in bowel or bladder control.  MUSCULOSKELETAL:Leg cramps LYMPHATICS: No enlarged nodes. No  history of splenectomy.  PSYCHIATRIC: No history of depression or anxiety.  ENDOCRINOLOGIC: No reports of sweating, cold or heat intolerance. No polyuria or polydipsia.  Marland Kitchen   Physical Examination p 77 bp 128/68 Wt 175 lbs BMI 32 Gen: resting comfortably, no acute distress HEENT: no scleral icterus, pupils equal round and reactive, no palptable cervical adenopathy,  CV: RRR, no m/r/g, no JVD, no carotid bruits Resp: Clear to auscultation bilaterally GI: abdomen is soft, non-tender, non-distended, normal bowel sounds, no hepatosplenomegaly MSK: extremities are warm, no edema.  Skin: warm, no rash Neuro:  no focal deficits Psych: appropriate affect   Diagnostic Studies 02/2011 MPI  Low risk exercise/Lexiscan Myoview as outlined. Equivocal ST- segment changes were noted in the setting of lead motion artifact. No chest pain reported. Perfusion imaging is most consistent with breast attenuation without clear evidence of scar or ischemia. LVEF 77%.  04/06/13 Clinic EKG: sinus rhythm, nomral axis, normal intervals, non-specific ST/T changes  04/2013 Echo LVEF 31-49%, grade I diastolic dysfunction,    Assessment and Plan  1. CAD  - denies any current chest pain. Does describe some DOE at 3-4 blocks that has developed over the last several months, she attributes it to being out of shape. Repeat echo showed normal LVEF, mild diastolic dysfunction - continue current meds. Change Toprol to coreg as she is concerned Toprol may be causing leg cramps.    2. HTN  - at goal in clinic today, continue current meds - she did not change from HCTZ to chlorthalidone due to cost, will continue HCTZ  3. Hyperlipidemia  - no recent panel on our system, will ask for labs from her pcp  - continue current meds    Follow up 6 months    Arnoldo Lenis, M.D., F.A.C.C.

## 2013-07-11 ENCOUNTER — Ambulatory Visit (INDEPENDENT_AMBULATORY_CARE_PROVIDER_SITE_OTHER): Payer: Medicare Other | Admitting: Cardiology

## 2013-07-11 ENCOUNTER — Encounter: Payer: Self-pay | Admitting: Cardiology

## 2013-07-11 VITALS — BP 128/68 | HR 77 | Ht 62.0 in | Wt 175.0 lb

## 2013-07-11 DIAGNOSIS — I2510 Atherosclerotic heart disease of native coronary artery without angina pectoris: Secondary | ICD-10-CM

## 2013-07-11 DIAGNOSIS — I10 Essential (primary) hypertension: Secondary | ICD-10-CM

## 2013-07-11 DIAGNOSIS — E785 Hyperlipidemia, unspecified: Secondary | ICD-10-CM

## 2013-07-11 MED ORDER — CARVEDILOL 3.125 MG PO TABS: 3.1250 mg | ORAL_TABLET | Freq: Two times a day (BID) | ORAL | Status: DC

## 2013-07-11 NOTE — Patient Instructions (Signed)
Your physician recommends that you schedule a follow-up appointment in: 6 months with Dr Bryna Colander will receive a reminder letter two months in advance reminding you to call and schedule your appointment. If you don't receive this letter, please contact our office.  Your physician has recommended you make the following change in your medication:   Stop Metoprolol Start Coreg 3.125 twice a day

## 2013-07-14 ENCOUNTER — Telehealth: Payer: Self-pay | Admitting: Cardiology

## 2013-07-14 MED ORDER — ROSUVASTATIN CALCIUM 40 MG PO TABS: 40.0000 mg | ORAL_TABLET | Freq: Every day | ORAL | Status: DC

## 2013-07-14 NOTE — Telephone Encounter (Signed)
Medication sent via escribe.  

## 2013-07-14 NOTE — Telephone Encounter (Signed)
Needs new RX sent to [facility] for Crestor to be able to use discount card / tgs

## 2013-11-15 ENCOUNTER — Ambulatory Visit (HOSPITAL_COMMUNITY)
Admission: RE | Admit: 2013-11-15 | Discharge: 2013-11-15 | Disposition: A | Discharge status: Home / Self Care | Payer: Medicare Other | Source: Ambulatory Visit | Attending: Family Medicine | Admitting: Family Medicine

## 2013-11-15 ENCOUNTER — Other Ambulatory Visit (HOSPITAL_COMMUNITY): Payer: Self-pay | Admitting: Family Medicine

## 2013-11-15 DIAGNOSIS — M79606 Pain in leg, unspecified: Secondary | ICD-10-CM

## 2013-11-15 DIAGNOSIS — M79609 Pain in unspecified limb: Secondary | ICD-10-CM | POA: Insufficient documentation

## 2013-11-15 DIAGNOSIS — M7989 Other specified soft tissue disorders: Secondary | ICD-10-CM

## 2014-02-07 ENCOUNTER — Other Ambulatory Visit (HOSPITAL_COMMUNITY): Payer: Self-pay | Admitting: Family Medicine

## 2014-02-07 DIAGNOSIS — Z1231 Encounter for screening mammogram for malignant neoplasm of breast: Secondary | ICD-10-CM

## 2014-02-27 ENCOUNTER — Ambulatory Visit (HOSPITAL_COMMUNITY)
Admission: RE | Admit: 2014-02-27 | Discharge: 2014-02-27 | Disposition: A | Discharge status: Home / Self Care | Payer: Medicare Other | Source: Ambulatory Visit | Attending: Family Medicine | Admitting: Family Medicine

## 2014-02-27 DIAGNOSIS — Z1231 Encounter for screening mammogram for malignant neoplasm of breast: Secondary | ICD-10-CM

## 2014-03-27 ENCOUNTER — Telehealth: Payer: Self-pay | Admitting: Physician Assistant

## 2014-03-27 MED ORDER — FENOFIBRATE 160 MG PO TABS
160.0000 mg | ORAL_TABLET | Freq: Every day | ORAL | Status: DC
Start: 2014-03-27 — End: 2014-10-03

## 2014-03-27 NOTE — Telephone Encounter (Signed)
Received fax refill request  Rx # [id number] Medication:  Fenofibrate 160 mg tab Qty 30 Sig:  Take one tablet by mouth once daily Physician:  Bonnell Public

## 2014-04-11 ENCOUNTER — Encounter: Payer: Self-pay | Admitting: Cardiology

## 2014-04-11 ENCOUNTER — Ambulatory Visit (INDEPENDENT_AMBULATORY_CARE_PROVIDER_SITE_OTHER): Payer: Medicare Other | Admitting: Cardiology

## 2014-04-11 VITALS — BP 116/80 | HR 95 | Ht 62.0 in | Wt 175.0 lb

## 2014-04-11 DIAGNOSIS — I10 Essential (primary) hypertension: Secondary | ICD-10-CM

## 2014-04-11 DIAGNOSIS — E785 Hyperlipidemia, unspecified: Secondary | ICD-10-CM

## 2014-04-11 DIAGNOSIS — I2510 Atherosclerotic heart disease of native coronary artery without angina pectoris: Secondary | ICD-10-CM

## 2014-04-11 NOTE — Patient Instructions (Signed)

## 2014-04-11 NOTE — Progress Notes (Signed)
Clinical Summary Ms. Deist is a 73 y.o.female seen today for follow up of the following medical problems.   1. CAD  - prior BMS to RCA in 2001 at [facility]  - 02/2011 MPI no ischemia  - ecoh 04/2013 showed LVEF 60-65% with grade I diastolic dysfunction. - denies any recent chest pain. No orthopnea, no PND, occas LE edema.  - compliant with meds.   2. HTN  - checks bp at home 2-3 times a week. Typically around 140/80 prior to taking meds   3. Hyperlpidiemia  - compliant with meds  - followed by PCP, no recent panel in our system   Past Medical History  Diagnosis Date  . Hypertension   . Cerebrovascular disease   . Arteriosclerotic cardiovascular disease (ASCVD) 2001    Bare-metal stent placed in the right coronary artery in 12/01; residual 50% lesion of the first diagonal and mid LAD  . Hyperlipidemia   . Diabetes mellitus     excellent control with a low-dose of a single oral agent  . Tobacco abuse, in remission     35 pack years; Quit in 1980     Allergies  Allergen Reactions  . Shellfish Allergy      Current Outpatient Prescriptions  Medication Sig Dispense Refill  . amLODipine (NORVASC) 10 MG tablet Take 10 mg by mouth daily.      Marland Kitchen aspirin 81 MG tablet Take 81 mg by mouth daily.      . carvedilol (COREG) 3.125 MG tablet Take 1 tablet (3.125 mg total) by mouth 2 (two) times daily. 60 tablet 3  . chlorthalidone (HYGROTON) 25 MG tablet Take 1 tablet (25 mg total) by mouth daily. 90 tablet 1  . fenofibrate 160 MG tablet Take 1 tablet (160 mg total) by mouth daily. 30 tablet 3  . hydrochlorothiazide (HYDRODIURIL) 25 MG tablet Take 1 tablet (25 mg total) by mouth daily. 30 tablet 11  . metFORMIN (GLUMETZA) 1000 MG (MOD) 24 hr tablet Take 500 mg by mouth daily. take 1/2 tablet daily.     . nitroGLYCERIN (NITROSTAT) 0.4 MG SL tablet Place 1 tablet (0.4 mg total) under the tongue every 5 (five) minutes as needed for chest pain. 25 tablet 6  . olmesartan  (BENICAR) 40 MG tablet Take 1 tablet (40 mg total) by mouth daily. 30 tablet 11  . rosuvastatin (CRESTOR) 40 MG tablet Take 1 tablet (40 mg total) by mouth daily. 30 tablet 6   No current facility-administered medications for this visit.     Past Surgical History  Procedure Laterality Date  . Vaginal hysterectomy  1980    Unilateral oophorectomy  . Colonoscopy  2006  . Inguinal hernia repair  1970s    Right  . Dilation and curettage of uterus       Allergies  Allergen Reactions  . Shellfish Allergy       Family History  Problem Relation Age of Onset  . Heart disease Mother     also hypertension and asthma     Social History Ms. Deyo reports that she quit smoking about 33 years ago. She has never used smokeless tobacco. Ms. Zimmermann reports that she does not drink alcohol.   Review of Systems CONSTITUTIONAL: No weight loss, fever, chills, weakness or fatigue.  HEENT: Eyes: No visual loss, blurred vision, double vision or yellow sclerae.No hearing loss, sneezing, congestion, runny nose or sore throat.  SKIN: No rash or itching.  CARDIOVASCULAR: per HPI RESPIRATORY: No  shortness of breath, cough or sputum.  GASTROINTESTINAL: No anorexia, nausea, vomiting or diarrhea. No abdominal pain or blood.  GENITOURINARY: No burning on urination, no polyuria NEUROLOGICAL: No headache, dizziness, syncope, paralysis, ataxia, numbness or tingling in the extremities. No change in bowel or bladder control.  MUSCULOSKELETAL: No muscle, back pain, joint pain or stiffness.  LYMPHATICS: No enlarged nodes. No history of splenectomy.  PSYCHIATRIC: No history of depression or anxiety.  ENDOCRINOLOGIC: No reports of sweating, cold or heat intolerance. No polyuria or polydipsia.  Marland Kitchen   Physical Examination p 95 bp 116/80 Wt 175 lbs BMI 32 Gen: resting comfortably, no acute distress HEENT: no scleral icterus, pupils equal round and reactive, no palptable cervical adenopathy,  CV: RRR, no  m/r/g, no JVD, no carotid bruits Resp: Clear to auscultation bilaterally GI: abdomen is soft, non-tender, non-distended, normal bowel sounds, no hepatosplenomegaly MSK: extremities are warm, no edema.  Skin: warm, no rash Neuro:  no focal deficits Psych: appropriate affect   Diagnostic Studies  02/2011 MPI  Low risk exercise/Lexiscan Myoview as outlined. Equivocal ST- segment changes were noted in the setting of lead motion artifact. No chest pain reported. Perfusion imaging is most consistent with breast attenuation without clear evidence of scar or ischemia. LVEF 77%.  04/06/13 Clinic EKG: sinus rhythm, nomral axis, normal intervals, non-specific ST/T changes  04/2013 Echo LVEF 02-63%, grade I diastolic dysfunction,      Assessment and Plan   1. CAD  - no current symptoms, continue risk factor modification  2. HTN  - at goal in clinic today, continue current meds - HCTZ currently held by pcp as it may be causing leg cramps, ok to try off this medication from our standpoint.   3. Hyperlipidemia  - no recent panel on our system, will ask for labs from her pcp  - continue current meds, she is appropriately on high dose statin.   F/u 6 months   Arnoldo Lenis, M.D.

## 2014-06-07 ENCOUNTER — Other Ambulatory Visit: Payer: Self-pay | Admitting: Cardiology

## 2014-06-07 DIAGNOSIS — I119 Hypertensive heart disease without heart failure: Secondary | ICD-10-CM | POA: Diagnosis not present

## 2014-06-07 DIAGNOSIS — H2513 Age-related nuclear cataract, bilateral: Secondary | ICD-10-CM | POA: Diagnosis not present

## 2014-06-07 DIAGNOSIS — H4011X1 Primary open-angle glaucoma, mild stage: Secondary | ICD-10-CM | POA: Diagnosis not present

## 2014-06-07 DIAGNOSIS — J399 Disease of upper respiratory tract, unspecified: Secondary | ICD-10-CM | POA: Diagnosis not present

## 2014-06-07 DIAGNOSIS — E119 Type 2 diabetes mellitus without complications: Secondary | ICD-10-CM | POA: Diagnosis not present

## 2014-06-07 MED ORDER — CARVEDILOL 3.125 MG PO TABS: 3.1250 mg | ORAL_TABLET | Freq: Two times a day (BID) | ORAL | Status: DC

## 2014-06-07 NOTE — Telephone Encounter (Signed)
Received fax refill request  Rx # [id number] Medication:  Coreg 3.125 mg tab Qty 180 Sig:  Take one tablet by mouth twice daily Physician:  Harl Bowie

## 2014-06-07 NOTE — Telephone Encounter (Signed)
Medication sent to pharmacy  

## 2014-06-08 ENCOUNTER — Other Ambulatory Visit: Payer: Self-pay | Admitting: Cardiology

## 2014-06-08 NOTE — Telephone Encounter (Signed)
Refilled 06/07/14

## 2014-06-08 NOTE — Telephone Encounter (Signed)
Received fax refill request  Rx # [id number] Medication:  Coreg 3.125 mg tab Qty 180 Sig:  Take one tablet by mouth twice daily Physician:  Harl Bowie

## 2014-07-14 DIAGNOSIS — H4011X1 Primary open-angle glaucoma, mild stage: Secondary | ICD-10-CM | POA: Diagnosis not present

## 2014-07-31 ENCOUNTER — Other Ambulatory Visit (HOSPITAL_COMMUNITY): Payer: Self-pay | Admitting: Family Medicine

## 2014-07-31 DIAGNOSIS — E0800 Diabetes mellitus due to underlying condition with hyperosmolarity without nonketotic hyperglycemic-hyperosmolar coma (NKHHC): Secondary | ICD-10-CM | POA: Diagnosis not present

## 2014-07-31 DIAGNOSIS — I119 Hypertensive heart disease without heart failure: Secondary | ICD-10-CM | POA: Diagnosis not present

## 2014-07-31 DIAGNOSIS — E119 Type 2 diabetes mellitus without complications: Secondary | ICD-10-CM | POA: Diagnosis not present

## 2014-07-31 DIAGNOSIS — K829 Disease of gallbladder, unspecified: Secondary | ICD-10-CM

## 2014-07-31 DIAGNOSIS — E782 Mixed hyperlipidemia: Secondary | ICD-10-CM | POA: Diagnosis not present

## 2014-08-02 ENCOUNTER — Ambulatory Visit (HOSPITAL_COMMUNITY)
Admission: RE | Admit: 2014-08-02 | Discharge: 2014-08-02 | Disposition: A | Discharge status: Home / Self Care | Payer: Medicare Other | Source: Ambulatory Visit | Attending: Family Medicine | Admitting: Family Medicine

## 2014-08-02 DIAGNOSIS — R1011 Right upper quadrant pain: Secondary | ICD-10-CM | POA: Diagnosis not present

## 2014-08-02 DIAGNOSIS — K829 Disease of gallbladder, unspecified: Secondary | ICD-10-CM

## 2014-08-04 DIAGNOSIS — H4011X2 Primary open-angle glaucoma, moderate stage: Secondary | ICD-10-CM | POA: Diagnosis not present

## 2014-08-12 DIAGNOSIS — K219 Gastro-esophageal reflux disease without esophagitis: Secondary | ICD-10-CM | POA: Diagnosis not present

## 2014-08-12 DIAGNOSIS — E0800 Diabetes mellitus due to underlying condition with hyperosmolarity without nonketotic hyperglycemic-hyperosmolar coma (NKHHC): Secondary | ICD-10-CM | POA: Diagnosis not present

## 2014-08-12 DIAGNOSIS — E119 Type 2 diabetes mellitus without complications: Secondary | ICD-10-CM | POA: Diagnosis not present

## 2014-08-18 DIAGNOSIS — H2513 Age-related nuclear cataract, bilateral: Secondary | ICD-10-CM | POA: Diagnosis not present

## 2014-08-18 DIAGNOSIS — H04129 Dry eye syndrome of unspecified lacrimal gland: Secondary | ICD-10-CM | POA: Diagnosis not present

## 2014-08-18 DIAGNOSIS — H4011X1 Primary open-angle glaucoma, mild stage: Secondary | ICD-10-CM | POA: Diagnosis not present

## 2014-09-19 DIAGNOSIS — H4011X1 Primary open-angle glaucoma, mild stage: Secondary | ICD-10-CM | POA: Diagnosis not present

## 2014-10-03 ENCOUNTER — Other Ambulatory Visit: Payer: Self-pay

## 2014-10-03 MED ORDER — FENOFIBRATE 160 MG PO TABS: 160.0000 mg | ORAL_TABLET | Freq: Every day | ORAL | Status: DC

## 2014-10-03 NOTE — Telephone Encounter (Signed)
escribe wont connect,called [facility] and left refill authorization on MD refill line for fenofibrate

## 2014-10-11 DIAGNOSIS — I119 Hypertensive heart disease without heart failure: Secondary | ICD-10-CM | POA: Diagnosis not present

## 2014-10-11 DIAGNOSIS — E119 Type 2 diabetes mellitus without complications: Secondary | ICD-10-CM | POA: Diagnosis not present

## 2014-10-11 DIAGNOSIS — K219 Gastro-esophageal reflux disease without esophagitis: Secondary | ICD-10-CM | POA: Diagnosis not present

## 2014-11-14 DIAGNOSIS — H4011X2 Primary open-angle glaucoma, moderate stage: Secondary | ICD-10-CM | POA: Diagnosis not present

## 2014-11-14 DIAGNOSIS — H2513 Age-related nuclear cataract, bilateral: Secondary | ICD-10-CM | POA: Diagnosis not present

## 2015-02-09 DIAGNOSIS — I119 Hypertensive heart disease without heart failure: Secondary | ICD-10-CM | POA: Diagnosis not present

## 2015-02-09 DIAGNOSIS — E0800 Diabetes mellitus due to underlying condition with hyperosmolarity without nonketotic hyperglycemic-hyperosmolar coma (NKHHC): Secondary | ICD-10-CM | POA: Diagnosis not present

## 2015-02-09 DIAGNOSIS — K219 Gastro-esophageal reflux disease without esophagitis: Secondary | ICD-10-CM | POA: Diagnosis not present

## 2015-02-09 DIAGNOSIS — E119 Type 2 diabetes mellitus without complications: Secondary | ICD-10-CM | POA: Diagnosis not present

## 2015-02-12 ENCOUNTER — Other Ambulatory Visit (HOSPITAL_COMMUNITY): Payer: Self-pay | Admitting: Family Medicine

## 2015-02-12 DIAGNOSIS — Z1231 Encounter for screening mammogram for malignant neoplasm of breast: Secondary | ICD-10-CM

## 2015-02-20 DIAGNOSIS — L600 Ingrowing nail: Secondary | ICD-10-CM | POA: Diagnosis not present

## 2015-02-20 DIAGNOSIS — E1151 Type 2 diabetes mellitus with diabetic peripheral angiopathy without gangrene: Secondary | ICD-10-CM | POA: Diagnosis not present

## 2015-02-20 DIAGNOSIS — E1140 Type 2 diabetes mellitus with diabetic neuropathy, unspecified: Secondary | ICD-10-CM | POA: Diagnosis not present

## 2015-02-20 DIAGNOSIS — M25579 Pain in unspecified ankle and joints of unspecified foot: Secondary | ICD-10-CM | POA: Diagnosis not present

## 2015-02-27 DIAGNOSIS — E0800 Diabetes mellitus due to underlying condition with hyperosmolarity without nonketotic hyperglycemic-hyperosmolar coma (NKHHC): Secondary | ICD-10-CM | POA: Diagnosis not present

## 2015-02-27 DIAGNOSIS — J069 Acute upper respiratory infection, unspecified: Secondary | ICD-10-CM | POA: Diagnosis not present

## 2015-02-27 DIAGNOSIS — J019 Acute sinusitis, unspecified: Secondary | ICD-10-CM | POA: Diagnosis not present

## 2015-03-02 ENCOUNTER — Ambulatory Visit (HOSPITAL_COMMUNITY)
Admission: RE | Admit: 2015-03-02 | Discharge: 2015-03-02 | Disposition: A | Discharge status: Home / Self Care | Payer: Medicare Other | Source: Ambulatory Visit | Attending: Family Medicine | Admitting: Family Medicine

## 2015-03-02 DIAGNOSIS — Z1231 Encounter for screening mammogram for malignant neoplasm of breast: Secondary | ICD-10-CM | POA: Diagnosis not present

## 2015-05-14 DIAGNOSIS — H2513 Age-related nuclear cataract, bilateral: Secondary | ICD-10-CM | POA: Diagnosis not present

## 2015-05-14 DIAGNOSIS — H401131 Primary open-angle glaucoma, bilateral, mild stage: Secondary | ICD-10-CM | POA: Diagnosis not present

## 2015-06-05 ENCOUNTER — Encounter: Payer: Self-pay | Admitting: *Deleted

## 2015-06-05 ENCOUNTER — Ambulatory Visit (INDEPENDENT_AMBULATORY_CARE_PROVIDER_SITE_OTHER): Payer: Medicare Other | Admitting: Cardiology

## 2015-06-05 ENCOUNTER — Encounter: Payer: Self-pay | Admitting: Cardiology

## 2015-06-05 VITALS — BP 117/73 | HR 88 | Ht 62.0 in | Wt 160.0 lb

## 2015-06-05 DIAGNOSIS — I10 Essential (primary) hypertension: Secondary | ICD-10-CM

## 2015-06-05 DIAGNOSIS — I2510 Atherosclerotic heart disease of native coronary artery without angina pectoris: Secondary | ICD-10-CM

## 2015-06-05 DIAGNOSIS — E785 Hyperlipidemia, unspecified: Secondary | ICD-10-CM | POA: Diagnosis not present

## 2015-06-05 DIAGNOSIS — R0602 Shortness of breath: Secondary | ICD-10-CM

## 2015-06-05 MED ORDER — ROSUVASTATIN CALCIUM 20 MG PO TABS: 20.0000 mg | ORAL_TABLET | Freq: Every day | ORAL | Status: DC

## 2015-06-05 NOTE — Patient Instructions (Signed)
Your physician recommends that you schedule a follow-up appointment in: [place] DR. BRANCH IN [place]   Your physician has recommended you make the following change in your medication:   STOP FENOFIBRATE   Your physician has recommended that you have a pulmonary function test. Pulmonary Function Tests are a group of tests that measure how well air moves in and out of your lungs.  Thank you for choosing [facility]!!

## 2015-06-05 NOTE — Progress Notes (Addendum)
Patient ID: DESHONDRA ANSELL, female   DOB: Oct 13, 1940, 75 y.o.   MRN: [id number]     Clinical Summary Ms. Newborn is a 75 y.o.female seen today for follow up of the following medical problems.   1. CAD  - prior BMS to RCA in 2001 at [facility]  - 02/2011 MPI no ischemia  - echo 04/2013 showed LVEF 60-65% with grade I diastolic dysfunction.  - denies any chest pain or SOB. Has had some palpitations over the last few weeks.   2. HTN  - checks bp at home 2-3 times a week. Typically around 140/80 prior to taking meds   3. Hyperlpidiemia  - compliant with meds  - followed by PCP, no recent panel in our system  4. Palpitations - started about 2-3 weeks ago. Feeling of heart skipping. Mild fatigue. Lasts just a few seconds. Occurs just a few times a week. - occasional coffee, instant tea from [employer] 3 times a week, drinks pepsi twice a week, no energy drinks, no EtoH. - she is actually only taking her coreg once daily instead of bid  5. SOB - ongoing for about 6 months. DOE with activity, example walking room to room or up a flight of stairs. No chest pain - No LE edema, no abomdinal swelling. Weight is down since last visit. No orthopnea but can cough with laying down.  - chronic cough nonproductive - former smoker x 30-35 years.   Past Medical History  Diagnosis Date  . Hypertension   . Cerebrovascular disease   . Arteriosclerotic cardiovascular disease (ASCVD) 2001    Bare-metal stent placed in the right coronary artery in 12/01; residual 50% lesion of the first diagonal and mid LAD  . Hyperlipidemia   . Diabetes mellitus     excellent control with a low-dose of a single oral agent  . Tobacco abuse, in remission     35 pack years; Quit in 1980     Allergies  Allergen Reactions  . Shellfish Allergy      Current Outpatient Prescriptions  Medication Sig Dispense Refill  . amLODipine (NORVASC) 10 MG tablet Take 10 mg by mouth daily.      Marland Kitchen aspirin 81 MG  tablet Take 81 mg by mouth daily.      . carvedilol (COREG) 3.125 MG tablet Take 1 tablet (3.125 mg total) by mouth 2 (two) times daily. 60 tablet 3  . chlorthalidone (HYGROTON) 25 MG tablet Take 1 tablet (25 mg total) by mouth daily. 90 tablet 1  . fenofibrate 160 MG tablet Take 1 tablet (160 mg total) by mouth daily. 30 tablet 3  . metFORMIN (GLUMETZA) 1000 MG (MOD) 24 hr tablet Take 500 mg by mouth daily. take 1/2 tablet daily.     . nitroGLYCERIN (NITROSTAT) 0.4 MG SL tablet Place 1 tablet (0.4 mg total) under the tongue every 5 (five) minutes as needed for chest pain. 25 tablet 6  . olmesartan (BENICAR) 40 MG tablet Take 1 tablet (40 mg total) by mouth daily. 30 tablet 11  . rosuvastatin (CRESTOR) 40 MG tablet Take 1 tablet (40 mg total) by mouth daily. 30 tablet 6   No current facility-administered medications for this visit.     Past Surgical History  Procedure Laterality Date  . Vaginal hysterectomy  1980    Unilateral oophorectomy  . Colonoscopy  2006  . Inguinal hernia repair  1970s    Right  . Dilation and curettage of uterus  Allergies  Allergen Reactions  . Shellfish Allergy       Family History  Problem Relation Age of Onset  . Heart disease Mother     also hypertension and asthma     Social History Ms. Milleson reports that she quit smoking about 35 years ago. Her smoking use included Cigarettes. She started smoking about 50 years ago. She smoked 1.00 pack per day. She has never used smokeless tobacco. Ms. Westcott reports that she does not drink alcohol.   Review of Systems CONSTITUTIONAL: No weight loss, fever, chills, weakness or fatigue.  HEENT: Eyes: No visual loss, blurred vision, double vision or yellow sclerae.No hearing loss, sneezing, congestion, runny nose or sore throat.  SKIN: No rash or itching.  CARDIOVASCULAR: per HPI RESPIRATORY: +SOB GASTROINTESTINAL: No anorexia, nausea, vomiting or diarrhea. No abdominal pain or blood.    GENITOURINARY: No burning on urination, no polyuria NEUROLOGICAL: No headache, dizziness, syncope, paralysis, ataxia, numbness or tingling in the extremities. No change in bowel or bladder control.  MUSCULOSKELETAL: No muscle, back pain, joint pain or stiffness.  LYMPHATICS: No enlarged nodes. No history of splenectomy.  PSYCHIATRIC: No history of depression or anxiety.  ENDOCRINOLOGIC: No reports of sweating, cold or heat intolerance. No polyuria or polydipsia.  Marland Kitchen   Physical Examination Filed Vitals:   06/05/15 1120  BP: 117/73  Pulse: 88   Filed Vitals:   06/05/15 1120  Height: 5\' 2"  (1.575 m)  Weight: 160 lb (72.576 kg)    Gen: resting comfortably, no acute distress HEENT: no scleral icterus, pupils equal round and reactive, no palptable cervical adenopathy,  CV: RRR, 2/6 systolic murmur RUSB, no jvd Resp: Clear to auscultation bilaterally GI: abdomen is soft, non-tender, non-distended, normal bowel sounds, no hepatosplenomegaly MSK: extremities are warm, no edema.  Skin: warm, no rash Neuro:  no focal deficits Psych: appropriate affect   Diagnostic Studies 02/2011 MPI  Low risk exercise/Lexiscan Myoview as outlined. Equivocal ST- segment changes were noted in the setting of lead motion artifact. No chest pain reported. Perfusion imaging is most consistent with breast attenuation without clear evidence of scar or ischemia. LVEF 77%.  04/06/13 Clinic EKG: sinus rhythm, nomral axis, normal intervals, non-specific ST/T changes  04/2013 Echo LVEF [id number], grade I diastolic dysfunction,    [id number] Clinic EKG (performed and reviewed in clinic): NSR Assessment and Plan   1. CAD  - no current symptoms - continue risk factor modification and secondary prevention  2. HTN  - at goal based on clinic numbers, continue current meds   3. Hyperlipidemia  - no recent panel on our system, request from pcp - continue current statin. Stop fenofibrate as no clinical  evidence to support clinical benefit.   4. Palpitations - she will wean caffeine, start taking coreg bid. If symptoms persist consider home monitor  5. SOB - appears euvolemic on exam. No episodes of chest pain. Long tobacco history along with chronic cough, possible COPD, will order PFTs. If normal consider cardiac workup at that time, potentially repeat echo.    F/u 6 months     Arnoldo Lenis, M.D.

## 2015-06-13 ENCOUNTER — Ambulatory Visit (HOSPITAL_COMMUNITY)
Admission: RE | Admit: 2015-06-13 | Discharge: 2015-06-13 | Disposition: A | Discharge status: Home / Self Care | Payer: Medicare Other | Source: Ambulatory Visit | Attending: Cardiology | Admitting: Cardiology

## 2015-06-13 DIAGNOSIS — R0602 Shortness of breath: Secondary | ICD-10-CM | POA: Diagnosis not present

## 2015-06-13 DIAGNOSIS — E119 Type 2 diabetes mellitus without complications: Secondary | ICD-10-CM | POA: Diagnosis not present

## 2015-06-13 DIAGNOSIS — K219 Gastro-esophageal reflux disease without esophagitis: Secondary | ICD-10-CM | POA: Diagnosis not present

## 2015-06-13 DIAGNOSIS — E782 Mixed hyperlipidemia: Secondary | ICD-10-CM | POA: Diagnosis not present

## 2015-06-13 LAB — PULMONARY FUNCTION TEST
DL/VA % pred: 61 %
DL/VA: 2.82 ml/min/mmHg/L
DLCO UNC: 10.51 ml/min/mmHg
DLCO unc % pred: 47 %
FEF 25-75 Post: 2.31 L/sec
FEF 25-75 Pre: 2.31 L/sec
FEF2575-%Change-Post: 0 %
FEF2575-%Pred-Post: 156 %
FEF2575-%Pred-Pre: 157 %
FEV1-%CHANGE-POST: 0 %
FEV1-%Pred-Post: 112 %
FEV1-%Pred-Pre: 112 %
FEV1-POST: 1.83 L
FEV1-PRE: 1.82 L
FEV1FVC-%Change-Post: 0 %
FEV1FVC-%Pred-Pre: 109 %
FEV6-%Change-Post: 0 %
FEV6-%PRED-POST: 107 %
FEV6-%Pred-Pre: 107 %
FEV6-POST: 2.15 L
FEV6-PRE: 2.16 L
FEV6FVC-%CHANGE-POST: 0 %
FEV6FVC-%PRED-POST: 104 %
FEV6FVC-%Pred-Pre: 103 %
FVC-%Change-Post: 0 %
FVC-%PRED-PRE: 102 %
FVC-%Pred-Post: 102 %
FVC-POST: 2.15 L
FVC-Pre: 2.16 L
PRE FEV6/FVC RATIO: 100 %
Post FEV1/FVC ratio: 85 %
Post FEV6/FVC ratio: 100 %
Pre FEV1/FVC ratio: 84 %
RV % PRED: 68 %
RV: 1.5 L
TLC % PRED: 79 %
TLC: 3.82 L

## 2015-06-13 MED ORDER — ALBUTEROL SULFATE (2.5 MG/3ML) 0.083% IN NEBU
2.5000 mg | INHALATION_SOLUTION | Freq: Once | RESPIRATORY_TRACT | Status: AC
  Administered 2015-06-13: 2.5 mg via RESPIRATORY_TRACT

## 2015-06-14 ENCOUNTER — Telehealth: Payer: Self-pay | Admitting: *Deleted

## 2015-06-14 DIAGNOSIS — R0602 Shortness of breath: Secondary | ICD-10-CM

## 2015-06-14 NOTE — Telephone Encounter (Signed)
-----   Message from Arnoldo Lenis, MD sent at 06/14/2015  1:09 PM EST ----- Breathing tests are abnormal and show that her lung have decreased ability to absorb oxygen, this is likely the cause of her SOB. Please refer to Dr Luan Pulling for SOB.   Nancy Abts MD

## 2015-06-19 DIAGNOSIS — E119 Type 2 diabetes mellitus without complications: Secondary | ICD-10-CM | POA: Diagnosis not present

## 2015-06-19 DIAGNOSIS — I119 Hypertensive heart disease without heart failure: Secondary | ICD-10-CM | POA: Diagnosis not present

## 2015-06-19 DIAGNOSIS — E782 Mixed hyperlipidemia: Secondary | ICD-10-CM | POA: Diagnosis not present

## 2015-07-09 ENCOUNTER — Ambulatory Visit: Payer: Self-pay | Admitting: Cardiology

## 2015-07-26 ENCOUNTER — Telehealth: Payer: Self-pay | Admitting: Cardiology

## 2015-07-26 NOTE — Telephone Encounter (Signed)
Pt came by stating that she has not seen Dr. Luan Pulling yet b/c her apt was rescheduled due to the office having to close. She is wondering if Dr. Harl Bowie wants to see her or wait till she seen Luan Pulling, her apt w. Luan Pulling is not till 3/27

## 2015-07-26 NOTE — Telephone Encounter (Signed)
Pt has not seen Dr.Hawkins yet as apt was rescheduled till end of march by his office,we will cancel her apt with Dr Harl Bowie on 07/30/15.Pt's phones are not ringing,cannot contact her     Will FYI Dr Harl Bowie

## 2015-07-30 ENCOUNTER — Ambulatory Visit: Payer: Medicare Other | Admitting: Cardiology

## 2015-08-27 ENCOUNTER — Ambulatory Visit (HOSPITAL_COMMUNITY)
Admission: RE | Admit: 2015-08-27 | Discharge: 2015-08-27 | Disposition: A | Discharge status: Home / Self Care | Payer: Medicare Other | Source: Ambulatory Visit | Attending: Pulmonary Disease | Admitting: Pulmonary Disease

## 2015-08-27 ENCOUNTER — Other Ambulatory Visit (HOSPITAL_COMMUNITY): Payer: Self-pay | Admitting: Pulmonary Disease

## 2015-08-27 DIAGNOSIS — R0602 Shortness of breath: Secondary | ICD-10-CM | POA: Diagnosis not present

## 2015-08-27 DIAGNOSIS — I10 Essential (primary) hypertension: Secondary | ICD-10-CM | POA: Diagnosis not present

## 2015-08-27 DIAGNOSIS — R918 Other nonspecific abnormal finding of lung field: Secondary | ICD-10-CM | POA: Insufficient documentation

## 2015-08-27 DIAGNOSIS — R05 Cough: Secondary | ICD-10-CM | POA: Diagnosis not present

## 2015-08-29 ENCOUNTER — Other Ambulatory Visit (HOSPITAL_COMMUNITY): Payer: Self-pay | Admitting: Pulmonary Disease

## 2015-08-29 DIAGNOSIS — J984 Other disorders of lung: Secondary | ICD-10-CM

## 2015-08-31 ENCOUNTER — Other Ambulatory Visit: Payer: Self-pay | Admitting: Cardiology

## 2015-09-03 ENCOUNTER — Ambulatory Visit (HOSPITAL_COMMUNITY)
Admission: RE | Admit: 2015-09-03 | Discharge: 2015-09-03 | Disposition: A | Discharge status: Home / Self Care | Payer: Medicare Other | Source: Ambulatory Visit | Attending: Pulmonary Disease | Admitting: Pulmonary Disease

## 2015-09-03 ENCOUNTER — Other Ambulatory Visit (HOSPITAL_COMMUNITY): Payer: Self-pay | Admitting: Pulmonary Disease

## 2015-09-03 DIAGNOSIS — I2510 Atherosclerotic heart disease of native coronary artery without angina pectoris: Secondary | ICD-10-CM | POA: Diagnosis not present

## 2015-09-03 DIAGNOSIS — R918 Other nonspecific abnormal finding of lung field: Secondary | ICD-10-CM | POA: Diagnosis not present

## 2015-09-03 DIAGNOSIS — J984 Other disorders of lung: Secondary | ICD-10-CM | POA: Diagnosis not present

## 2015-09-03 DIAGNOSIS — E041 Nontoxic single thyroid nodule: Secondary | ICD-10-CM | POA: Diagnosis not present

## 2015-09-25 DIAGNOSIS — E782 Mixed hyperlipidemia: Secondary | ICD-10-CM | POA: Diagnosis not present

## 2015-09-25 DIAGNOSIS — D440 Neoplasm of uncertain behavior of thyroid gland: Secondary | ICD-10-CM | POA: Diagnosis not present

## 2015-09-25 DIAGNOSIS — E119 Type 2 diabetes mellitus without complications: Secondary | ICD-10-CM | POA: Diagnosis not present

## 2015-09-25 DIAGNOSIS — I119 Hypertensive heart disease without heart failure: Secondary | ICD-10-CM | POA: Diagnosis not present

## 2015-10-08 ENCOUNTER — Other Ambulatory Visit (HOSPITAL_COMMUNITY): Payer: Self-pay | Admitting: Family Medicine

## 2015-10-08 DIAGNOSIS — E049 Nontoxic goiter, unspecified: Secondary | ICD-10-CM

## 2015-10-16 ENCOUNTER — Ambulatory Visit (INDEPENDENT_AMBULATORY_CARE_PROVIDER_SITE_OTHER): Payer: Medicare Other | Admitting: Cardiology

## 2015-10-16 ENCOUNTER — Encounter: Payer: Self-pay | Admitting: Cardiology

## 2015-10-16 VITALS — BP 106/54 | HR 89 | Ht 62.0 in | Wt 166.0 lb

## 2015-10-16 DIAGNOSIS — E785 Hyperlipidemia, unspecified: Secondary | ICD-10-CM | POA: Diagnosis not present

## 2015-10-16 DIAGNOSIS — I2510 Atherosclerotic heart disease of native coronary artery without angina pectoris: Secondary | ICD-10-CM | POA: Diagnosis not present

## 2015-10-16 DIAGNOSIS — I10 Essential (primary) hypertension: Secondary | ICD-10-CM | POA: Diagnosis not present

## 2015-10-16 DIAGNOSIS — R002 Palpitations: Secondary | ICD-10-CM | POA: Diagnosis not present

## 2015-10-16 MED ORDER — IRBESARTAN 300 MG PO TABS: 300.0000 mg | ORAL_TABLET | Freq: Every day | ORAL | Status: DC

## 2015-10-16 NOTE — Patient Instructions (Signed)
Medication Instructions:  STOP AVALIDE START IRBESARTAN 300 MG DAILY  Labwork: I WILL REQUEST LAB WORK FROM PCP   Testing/Procedures: NONE  Follow-Up: Your physician wants you to follow-up in: 6 MONTHS.  You will receive a reminder letter in the mail two months in advance. If you don't receive a letter, please call our office to schedule the follow-up appointment.   Any Other Special Instructions Will Be Listed Below (If Applicable).     If you need a refill on your cardiac medications before your next appointment, please call your pharmacy.

## 2015-10-16 NOTE — Progress Notes (Signed)
Patient ID: TEQUITA FALKENHAGEN, female   DOB: Aug 10, 1940, 75 y.o.   MRN: [id number]     Clinical Summary Ms. Falu is a 75 y.o.female seen today for follow up of the following medical problems.   1. CAD  - prior BMS to RCA in 2001 at [facility]  - 02/2011 MPI no ischemia  - echo 04/2013 showed LVEF 60-65% with grade I diastolic dysfunction.  - denies any chest pain since last visit. Subjectively reports chronic SOB, however can walk on treadmill for 30 minutes at fast walk without troubles.    2. HTN  - compliant with meds  3. Hyperlpidiemia  - compliant with meds   4. Palpitations - no recent symptoms. She had been mistakingly only taking her coreg daily, with bid dosing symptoms resolve.d    5. SOB/Abnormal PFTs - former smoker x 30-35 years. - PFTs Jan 2017 with severely redcued DLCO, referred to Dr Luan Pulling.    6. Muscle cramps - on and off x 6 months. Primarily involve the hands and feet   SH: has 3 children. 2 grandchilren, one is a Marine scientist working at childrens [employer] in [place].  Past Medical History  Diagnosis Date  . Hypertension   . Cerebrovascular disease   . Arteriosclerotic cardiovascular disease (ASCVD) 2001    Bare-metal stent placed in the right coronary artery in 12/01; residual 50% lesion of the first diagonal and mid LAD  . Hyperlipidemia   . Diabetes mellitus     excellent control with a low-dose of a single oral agent  . Tobacco abuse, in remission     35 pack years; Quit in 1980     Allergies  Allergen Reactions  . Shellfish Allergy      Current Outpatient Prescriptions  Medication Sig Dispense Refill  . amLODipine (NORVASC) 10 MG tablet Take 10 mg by mouth daily.      Marland Kitchen aspirin 81 MG tablet Take 81 mg by mouth daily.      . carvedilol (COREG) 3.125 MG tablet TAKE ONE TABLET BY MOUTH TWICE DAILY 60 tablet 6  . irbesartan-hydrochlorothiazide (AVALIDE) 300-12.5 MG tablet Take 1 tablet by mouth daily.    . metFORMIN (GLUMETZA)  1000 MG (MOD) 24 hr tablet Take 500 mg by mouth daily.     . nitroGLYCERIN (NITROSTAT) 0.4 MG SL tablet Place 1 tablet (0.4 mg total) under the tongue every 5 (five) minutes as needed for chest pain. 25 tablet 6  . rosuvastatin (CRESTOR) 20 MG tablet Take 1 tablet (20 mg total) by mouth daily. 42 tablet 0   No current facility-administered medications for this visit.     Past Surgical History  Procedure Laterality Date  . Vaginal hysterectomy  1980    Unilateral oophorectomy  . Colonoscopy  2006  . Inguinal hernia repair  1970s    Right  . Dilation and curettage of uterus       Allergies  Allergen Reactions  . Shellfish Allergy       Family History  Problem Relation Age of Onset  . Heart disease Mother     also hypertension and asthma     Social History Ms. Mouton reports that she quit smoking about 35 years ago. Her smoking use included Cigarettes. She started smoking about 50 years ago. She has a 18 pack-year smoking history. She has never used smokeless tobacco. Ms. Pagett reports that she does not drink alcohol.   Review of Systems CONSTITUTIONAL: No weight loss, fever, chills, weakness or  fatigue.  HEENT: Eyes: No visual loss, blurred vision, double vision or yellow sclerae.No hearing loss, sneezing, congestion, runny nose or sore throat.  SKIN: No rash or itching.  CARDIOVASCULAR: per HPI RESPIRATORY: No shortness of breath, cough or sputum.  GASTROINTESTINAL: No anorexia, nausea, vomiting or diarrhea. No abdominal pain or blood.  GENITOURINARY: No burning on urination, no polyuria NEUROLOGICAL: No headache, dizziness, syncope, paralysis, ataxia, numbness or tingling in the extremities. No change in bowel or bladder control.  MUSCULOSKELETAL: +muscle cramps  LYMPHATICS: No enlarged nodes. No history of splenectomy.  PSYCHIATRIC: No history of depression or anxiety.  ENDOCRINOLOGIC: No reports of sweating, cold or heat intolerance. No polyuria or polydipsia.   Marland Kitchen   Physical Examination Filed Vitals:   10/16/15 1403  BP: 106/54  Pulse: 89   Filed Vitals:   10/16/15 1403  Height: 5\' 2"  (1.575 m)  Weight: 166 lb (75.297 kg)    Gen: resting comfortably, no acute distress HEENT: no scleral icterus, pupils equal round and reactive, no palptable cervical adenopathy,  CV: RRR, no m/r/g, no jvd Resp: Clear to auscultation bilaterally GI: abdomen is soft, non-tender, non-distended, normal bowel sounds, no hepatosplenomegaly MSK: extremities are warm, no edema.  Skin: warm, no rash Neuro:  no focal deficits Psych: appropriate affect   Diagnostic Studies 02/2011 MPI  Low risk exercise/Lexiscan Myoview as outlined. Equivocal ST- segment changes were noted in the setting of lead motion artifact. No chest pain reported. Perfusion imaging is most consistent with breast attenuation without clear evidence of scar or ischemia. LVEF 77%.  04/06/13 Clinic EKG: sinus rhythm, nomral axis, normal intervals, non-specific ST/T changes  04/2013 Echo LVEF 123456, grade I diastolic dysfunction,   Jan 2017 PFTs: normal spirometry, minimal restrictive changes, severely decreased DLCO   Assessment and Plan  1. CAD - no current symptoms - we will continue current meds  2. HTN  - reports muscle cramping. We will request labs from pcp to look at K and Mg. We will d/c her HCTZ and follow symptoms.   3. Hyperlipidemia  - request labs from pcp, continue staitn  4. Palpitations - no current symptoms. Continue to monitor  5. SOB/Abnormal PFTs - continue to follow with Dr Luan Pulling - despite her reported symptoms, she reports she can walk up to 30 minutes on a treadmill at fast walking pace.  - if symptoms progress can consider further cardiac testing, however testing over the last few years has been fairly unremarkable. Would also consider exercise stress to objectively measure her exercise capacity if symptoms progress.      Arnoldo Lenis,  M.D.

## 2015-10-17 ENCOUNTER — Encounter (HOSPITAL_COMMUNITY): Payer: Self-pay

## 2015-10-17 ENCOUNTER — Encounter (HOSPITAL_COMMUNITY)
Admission: RE | Admit: 2015-10-17 | Discharge: 2015-10-17 | Disposition: A | Discharge status: Home / Self Care | Payer: Medicare Other | Source: Ambulatory Visit | Attending: Family Medicine | Admitting: Family Medicine

## 2015-10-17 DIAGNOSIS — E049 Nontoxic goiter, unspecified: Secondary | ICD-10-CM | POA: Insufficient documentation

## 2015-10-17 MED ORDER — SODIUM IODIDE I 131 CAPSULE
10.0000 | Freq: Once | INTRAVENOUS | Status: AC | PRN
  Administered 2015-10-17: 10 via ORAL

## 2015-10-18 ENCOUNTER — Encounter (HOSPITAL_COMMUNITY)
Admission: RE | Admit: 2015-10-18 | Discharge: 2015-10-18 | Disposition: A | Discharge status: Home / Self Care | Payer: Medicare Other | Source: Ambulatory Visit | Attending: Family Medicine | Admitting: Family Medicine

## 2015-10-18 DIAGNOSIS — E049 Nontoxic goiter, unspecified: Secondary | ICD-10-CM | POA: Diagnosis not present

## 2015-10-18 MED ORDER — SODIUM PERTECHNETATE TC 99M INJECTION
10.0000 | Freq: Once | INTRAVENOUS | Status: AC | PRN
  Administered 2015-10-18: 10.3 via INTRAVENOUS

## 2015-10-24 DIAGNOSIS — E782 Mixed hyperlipidemia: Secondary | ICD-10-CM | POA: Diagnosis not present

## 2015-10-24 DIAGNOSIS — E119 Type 2 diabetes mellitus without complications: Secondary | ICD-10-CM | POA: Diagnosis not present

## 2015-10-24 DIAGNOSIS — I119 Hypertensive heart disease without heart failure: Secondary | ICD-10-CM | POA: Diagnosis not present

## 2016-01-09 DIAGNOSIS — D6489 Other specified anemias: Secondary | ICD-10-CM | POA: Diagnosis not present

## 2016-01-09 DIAGNOSIS — E782 Mixed hyperlipidemia: Secondary | ICD-10-CM | POA: Diagnosis not present

## 2016-01-09 DIAGNOSIS — I119 Hypertensive heart disease without heart failure: Secondary | ICD-10-CM | POA: Diagnosis not present

## 2016-01-09 DIAGNOSIS — E119 Type 2 diabetes mellitus without complications: Secondary | ICD-10-CM | POA: Diagnosis not present

## 2016-01-09 DIAGNOSIS — Z91018 Allergy to other foods: Secondary | ICD-10-CM | POA: Diagnosis not present

## 2016-01-09 DIAGNOSIS — Z0000 Encounter for general adult medical examination without abnormal findings: Secondary | ICD-10-CM | POA: Diagnosis not present

## 2016-01-22 ENCOUNTER — Other Ambulatory Visit: Payer: Self-pay | Admitting: *Deleted

## 2016-01-22 MED ORDER — AMLODIPINE BESYLATE 10 MG PO TABS: 10.0000 mg | ORAL_TABLET | Freq: Every day | ORAL | 2 refills | Status: DC

## 2016-01-24 DIAGNOSIS — Z23 Encounter for immunization: Secondary | ICD-10-CM | POA: Diagnosis not present

## 2016-02-25 ENCOUNTER — Other Ambulatory Visit (HOSPITAL_COMMUNITY): Payer: Self-pay | Admitting: Family Medicine

## 2016-02-25 DIAGNOSIS — Z1231 Encounter for screening mammogram for malignant neoplasm of breast: Secondary | ICD-10-CM

## 2016-02-28 ENCOUNTER — Ambulatory Visit (INDEPENDENT_AMBULATORY_CARE_PROVIDER_SITE_OTHER): Payer: Medicare Other | Admitting: Adult Health

## 2016-02-28 ENCOUNTER — Encounter: Payer: Self-pay | Admitting: Adult Health

## 2016-02-28 VITALS — BP 136/76 | HR 88 | Ht 62.5 in | Wt 171.0 lb

## 2016-02-28 DIAGNOSIS — R0602 Shortness of breath: Secondary | ICD-10-CM | POA: Diagnosis not present

## 2016-02-28 DIAGNOSIS — R0789 Other chest pain: Secondary | ICD-10-CM | POA: Diagnosis not present

## 2016-02-28 DIAGNOSIS — I2510 Atherosclerotic heart disease of native coronary artery without angina pectoris: Secondary | ICD-10-CM

## 2016-02-28 NOTE — Patient Instructions (Signed)
Medication Instructions:  Your physician recommends that you continue on your current medications as directed. Please refer to the Current Medication list given to you today.   Labwork: NONE  Testing/Procedures: Your physician has requested that you have a lexiscan myoview. For further information please visit [email or web address]. Please follow instruction sheet, as given.   Your physician has requested that you have an echocardiogram. Echocardiography is a painless test that uses sound waves to create images of your heart. It provides your doctor with information about the size and shape of your heart and how well your heart's chambers and valves are working. This procedure takes approximately one hour. There are no restrictions for this procedure.    Follow-Up: Your physician recommends that you schedule a follow-up appointment in: 1 MONTH ( AFTER TESTING )    Any Other Special Instructions Will Be Listed Below (If Applicable).     If you need a refill on your cardiac medications before your next appointment, please call your pharmacy.

## 2016-02-28 NOTE — Progress Notes (Signed)
Cardiology Office Note   Date:  02/28/2016   ID:  Nancy Liu, Nancy Liu August 08, 1940, MRN [id number]  PCP:  Elyn Peers, MD  Cardiologist: Cloria Spring, NP   No chief complaint on file.     History of Present Illness: Nancy Liu is a 75 y.o. female who presents for ongoing assessment and management of coronary artery disease with history of bare metal stent to the right coronary artery in 2001 most recent nuclear medicine stress test in 2012 revealing no ischemia, echocardiogram 2014 with LVEF of 60-65% with grade 1 diastolic dysfunction.    Other history includes hypertension, hyperlipidemia, and chronic dyspnea. The patient was last seen by Dr. Harl Bowie on 10/16/2015. He was symptomatically stable with exception of some mild muscle cramping. Hydrochlorothiazide was discontinued with repeat labs to evaluate potassium and magnesium via primary care.  Over the last 2-3 months she has had worsening dyspnea on exertion, fatigue, and substernal chest pressure. The patient also started taking metoprolol again as she felt as she had forgotten to take it. This caused her to have some cramping and she realized that she was not to take it anymore and the cramping one away.  She is concerned about her current symptoms and need for medication adjustments or more testing.  Past Medical History:  Diagnosis Date  . Arteriosclerotic cardiovascular disease (ASCVD) 2001   Bare-metal stent placed in the right coronary artery in 12/01; residual 50% lesion of the first diagonal and mid LAD  . Cerebrovascular disease   . Diabetes mellitus    excellent control with a low-dose of a single oral agent  . Hyperlipidemia   . Hypertension   . Tobacco abuse, in remission    35 pack years; Quit in 1980    Past Surgical History:  Procedure Laterality Date  . COLONOSCOPY  2006  . DILATION AND CURETTAGE OF UTERUS    . INGUINAL HERNIA REPAIR  1970s   Right  . VAGINAL HYSTERECTOMY  1980   Unilateral oophorectomy     Current Outpatient Prescriptions  Medication Sig Dispense Refill  . amLODipine (NORVASC) 10 MG tablet Take 1 tablet (10 mg total) by mouth daily. 90 tablet 2  . aspirin 81 MG tablet Take 81 mg by mouth daily.      . carvedilol (COREG) 3.125 MG tablet TAKE ONE TABLET BY MOUTH TWICE DAILY 60 tablet 6  . irbesartan (AVAPRO) 300 MG tablet Take 1 tablet (300 mg total) by mouth daily. 90 tablet 3  . metFORMIN (GLUMETZA) 1000 MG (MOD) 24 hr tablet Take 500 mg by mouth daily.     . nitroGLYCERIN (NITROSTAT) 0.4 MG SL tablet Place 1 tablet (0.4 mg total) under the tongue every 5 (five) minutes as needed for chest pain. 25 tablet 6  . rosuvastatin (CRESTOR) 20 MG tablet Take 1 tablet (20 mg total) by mouth daily. 42 tablet 0   No current facility-administered medications for this visit.     Allergies:   Shellfish allergy    Social History:  The patient  reports that she quit smoking about 35 years ago. Her smoking use included Cigarettes. She started smoking about 50 years ago. She has a 18.00 pack-year smoking history. She has never used smokeless tobacco. She reports that she does not drink alcohol.   Family History:  The patient's family history includes Heart disease in her mother.    ROS: All other systems are reviewed and negative. Unless otherwise mentioned in H&P    PHYSICAL  EXAM: VS:  There were no vitals taken for this visit. , BMI There is no height or weight on file to calculate BMI. GEN: Well nourished, well developed, in no acute distress  HEENT: normal  Neck: no JVD, carotid bruits, or masses Cardiac: RRR; 1/6 systolic murmur heard best at the left sternal border, rubs, or gallops,no edema  Respiratory:  clear to auscultation bilaterally, normal work of breathing GI: soft, nontender, nondistended, + BS MS: no deformity or atrophy  Skin: warm and dry, no rash Neuro:  Strength and sensation are intact Psych: euthymic mood, full affect   Recent  Labs: No results found for requested labs within last 8760 hours.    Lipid Panel    Component Value Date/Time   CHOL 134 08/01/2010 1731   TRIG 132 08/01/2010 1731   HDL 40 08/01/2010 1731   CHOLHDL 3.4 Ratio 08/01/2010 1731   VLDL 26 08/01/2010 1731   LDLCALC 68 08/01/2010 1731   LDLCALC 81 01/21/2010      Wt Readings from Last 3 Encounters:  10/16/15 166 lb (75.3 kg)  06/05/15 160 lb (72.6 kg)  04/11/14 175 lb (79.4 kg)      Other studies Reviewed: Additional studies/ records that were reviewed today include: Echocardiogram and most recent stress test.  Review of the above records demonstrates:   Echocardiogram 04/19/2013 Left ventricle: The cavity size was normal. Wall thickness was increased in a pattern of mild LVH. Systolic function was normal. The estimated ejection fraction was in the range of 60% to 65%. Wall motion was normal; there were no regional wall motion abnormalities. Doppler parameters are consistent with abnormal left ventricular relaxation (grade 1 diastolic dysfunction). - Aortic valve: Mildly to moderately calcified annulus. Probably trileaflet; mildly calcified leaflets. No significant regurgitation. Mean gradient: 15mm Hg (S). - Mitral valve: Calcified annulus. Trivial regurgitation. - Right atrium: Central venous pressure: 60mm Hg (est). - Atrial septum: There was increased thickness of the septum, consistent with lipomatous hypertrophy. No defect or patent foramen ovale was identified. - Tricuspid valve: Trivial regurgitation. - Pulmonary arteries: Systolic pressure could not be accurately estimated. - Pericardium, extracardiac: There was no pericardial effusion.   ASSESSMENT AND PLAN:  1.  Coronary artery disease: Known history of bare metal stent to right coronary artery 2001. I will plan a Lexiscan Myoview for diagnostic prognostic purposes in the setting of recurrent chest discomfort and worsening dyspnea on  exertion with fatigue. Also plan a echocardiogram which can be done on the same day to evaluate changes in LV function and pulmonary pressures I discussed with the patient verbalizes understanding is willing to proceed with deceased tests. She is provided a prescription for nitroglycerin sublingual to take when necessary  2. Hypertension: Blood pressures currently well-controlled on carvedilol, atorvastatin and amlodipine. No changes at this time.  3. Hypercholesterolemia: Patient will continue on Crestor 20 mg daily. She is requesting generic version of this for refills to save money. This is provided.   Current medicines are reviewed at length with the patient today.    Labs/ tests ordered today include: Lexiscan Myoview and echocardiogram     Disposition:   FU with post stress test and echo.   Signed, Jory Sims, NP  02/28/2016 7:38 AM    [facility] [address] [place], [place] [address] Phone: [phone]; Fax: [phone]

## 2016-02-28 NOTE — Progress Notes (Signed)
Name: Nancy Liu    DOB: April 05, 1941  Age: 75 y.o.  MR#: [id number]       PCP:  Elyn Peers, MD      Insurance: Payor: [employer] MEDICARE / Plan: [facility] MEDICARE / Product Type: *No Product type* /   CC:   No chief complaint on file.   VS Vitals:   02/28/16 1604  Pulse: 88  SpO2: 99%  Weight: 171 lb (77.6 kg)  Height: 5' 2.5" (1.588 m)    Weights Current Weight  02/28/16 171 lb (77.6 kg)  10/16/15 166 lb (75.3 kg)  06/05/15 160 lb (72.6 kg)    Blood Pressure  BP Readings from Last 3 Encounters:  10/16/15 (!) 106/54  06/05/15 117/73  04/11/14 116/80     Admit date:  (Not on file) Last encounter with RMR:  Visit date not found   Allergy Shellfish allergy  Current Outpatient Prescriptions  Medication Sig Dispense Refill  . amLODipine (NORVASC) 10 MG tablet Take 1 tablet (10 mg total) by mouth daily. 90 tablet 2  . aspirin 81 MG tablet Take 81 mg by mouth daily.      . carvedilol (COREG) 3.125 MG tablet TAKE ONE TABLET BY MOUTH TWICE DAILY 60 tablet 6  . irbesartan (AVAPRO) 300 MG tablet Take 1 tablet (300 mg total) by mouth daily. 90 tablet 3  . metFORMIN (GLUMETZA) 1000 MG (MOD) 24 hr tablet Take 500 mg by mouth daily.     . rosuvastatin (CRESTOR) 20 MG tablet Take 1 tablet (20 mg total) by mouth daily. 42 tablet 0  . nitroGLYCERIN (NITROSTAT) 0.4 MG SL tablet Place 1 tablet (0.4 mg total) under the tongue every 5 (five) minutes as needed for chest pain. 25 tablet 6   No current facility-administered medications for this visit.     Discontinued Meds:   There are no discontinued medications.  Patient Active Problem List   Diagnosis Date Noted  . Weight loss 04/01/2012  . Microcytic anemia 01/31/2010  . Cerebrovascular disease 01/31/2010  . Arteriosclerotic cardiovascular disease (ASCVD) 01/03/2009  . HYPERLIPIDEMIA 10/22/2007  . HYPERTENSION 10/22/2007    LABS    Component Value Date/Time   NA 142 01/21/2010   NA 143 02/06/2009 2009   NA 143  02/06/2009   K 3.9 01/21/2010   K 4.7 02/06/2009 2009   K 4.7 02/06/2009   CL 105 01/21/2010   CL 105 02/06/2009 2009   CL 105 02/06/2009   CO2 26 01/21/2010   CO2 26 02/06/2009 2009   CO2 26 02/06/2009   GLUCOSE 92 01/21/2010   GLUCOSE 97 02/06/2009 2009   GLUCOSE 97 02/06/2009   BUN 17 01/21/2010   BUN 20 02/06/2009 2009   BUN 20 02/06/2009   CREATININE 1.23 01/21/2010   CREATININE 1.22 (H) 02/06/2009 2009   CREATININE 1.22 02/06/2009   CALCIUM 9.5 01/21/2010   CALCIUM 9.7 02/06/2009 2009   CALCIUM 9.7 02/06/2009   GFRNONAA 56 01/21/2010   CMP     Component Value Date/Time   NA 142 01/21/2010   K 3.9 01/21/2010   CL 105 01/21/2010   CO2 26 01/21/2010   GLUCOSE 92 01/21/2010   BUN 17 01/21/2010   CREATININE 1.23 01/21/2010   CALCIUM 9.5 01/21/2010   PROT 6.8 01/21/2010   ALBUMIN 4.5 01/21/2010   AST 23 01/21/2010   ALT 16 01/21/2010   ALKPHOS 49 01/21/2010   BILITOT 0.3 02/06/2009 2009   GFRNONAA 56 01/21/2010  Component Value Date/Time   WBC 10.0 01/21/2010   WBC 7.7 01/04/2009 2007   WBC 7.7 01/04/2009   HGB 11.3 01/21/2010   HGB 11.9 (L) 01/04/2009 2007   HGB 11.9 01/04/2009   HCT 36.6 01/21/2010   HCT 38.8 01/04/2009 2007   HCT 38.8 01/04/2009   MCV 75.8 01/21/2010   MCV 75.6 (L) 01/04/2009 2007   MCV 75.6 01/04/2009    Lipid Panel     Component Value Date/Time   CHOL 134 08/01/2010 1731   TRIG 132 08/01/2010 1731   HDL 40 08/01/2010 1731   CHOLHDL 3.4 Ratio 08/01/2010 1731   VLDL 26 08/01/2010 1731   LDLCALC 68 08/01/2010 1731   LDLCALC 81 01/21/2010    ABG No results found for: PHART, PCO2ART, PO2ART, HCO3, TCO2, ACIDBASEDEF, O2SAT   No results found for: TSH BNP (last 3 results) No results for input(s): BNP in the last 8760 hours.  ProBNP (last 3 results) No results for input(s): PROBNP in the last 8760 hours.  Cardiac Panel (last 3 results) No results for input(s): CKTOTAL, CKMB, TROPONINI, RELINDX in the last 72 hours.   Iron/TIBC/Ferritin/ %Sat    Component Value Date/Time   IRON 53 01/04/2009 2007   TIBC 381 01/04/2009 2007   FERRITIN 13 01/04/2009 2007   IRONPCTSAT 14 (L) 01/04/2009 2007     EKG Orders placed or performed in visit on 06/05/15  . EKG 12-Lead     Prior Assessment and Plan Problem List as of 02/28/2016 Reviewed: 10/17/2015  8:42 AM by Carlyle Dolly, MD     Cardiovascular and Mediastinum   HYPERTENSION   Last Assessment & Plan 04/01/2012 Office Visit Edited 04/01/2012  1:34 PM by Imogene Burn, PA    Well-controlled.The patient is complaining of the expense of Benicar HCT. We have given her samples of this for about one month and then we will give her prescription for Benicar 40 mg daily and hydrochlorothiazide 25 mg daily to lower the cost.      Arteriosclerotic cardiovascular disease (ASCVD)   Last Assessment & Plan 04/01/2012 Office Visit Written 04/01/2012  1:32 PM by Imogene Burn, PA    Patient is doing well without chest pain. She had a negative stress Myoview approximately one year ago.      Cerebrovascular disease     Other   HYPERLIPIDEMIA   Last Assessment & Plan 04/01/2012 Office Visit Edited 04/01/2012  1:33 PM by Imogene Burn, PA    Patient's cholesterol has been elevated but she stopped taking her Crestor because of leg cramps. She recently resumed this and so far is not having leg cramps. We have given her samples of Crestor.This is being followed by Dr. Criss Rosales.      Microcytic anemia   Last Assessment & Plan 03/10/2011 Office Visit Written 03/11/2011 12:40 PM by Yehuda Savannah, MD    Patient has a minimal anemia with microcytosis (H&H of 11.3/36.3 in 12/2009 and MCV of 76); basic evaluation, at least to exclude iron deficiency, would be appropriate.  That will be left to Dr. Fransico Setters discretion.      Weight loss   Last Assessment & Plan 04/01/2012 Office Visit Written 04/01/2012  1:35 PM by Imogene Burn, PA    Patient has lost 14 pounds over the  past year by cutting portions. She is commended for this and recommend continued weight loss and exercise.          Imaging: No results found.

## 2016-02-29 ENCOUNTER — Observation Stay (HOSPITAL_COMMUNITY)
Admission: EM | Admit: 2016-02-29 | Discharge: 2016-03-03 | Disposition: A | Discharge status: Home / Self Care | Payer: Medicare Other | Attending: Internal Medicine | Admitting: Internal Medicine

## 2016-02-29 ENCOUNTER — Emergency Department (HOSPITAL_COMMUNITY): Payer: Medicare Other

## 2016-02-29 ENCOUNTER — Other Ambulatory Visit: Payer: Self-pay | Admitting: Adult Health

## 2016-02-29 ENCOUNTER — Encounter (HOSPITAL_COMMUNITY): Payer: Self-pay | Admitting: Emergency Medicine

## 2016-02-29 DIAGNOSIS — E119 Type 2 diabetes mellitus without complications: Secondary | ICD-10-CM | POA: Diagnosis not present

## 2016-02-29 DIAGNOSIS — Z8673 Personal history of transient ischemic attack (TIA), and cerebral infarction without residual deficits: Secondary | ICD-10-CM | POA: Insufficient documentation

## 2016-02-29 DIAGNOSIS — I2510 Atherosclerotic heart disease of native coronary artery without angina pectoris: Secondary | ICD-10-CM | POA: Diagnosis not present

## 2016-02-29 DIAGNOSIS — Z7982 Long term (current) use of aspirin: Secondary | ICD-10-CM | POA: Insufficient documentation

## 2016-02-29 DIAGNOSIS — I119 Hypertensive heart disease without heart failure: Secondary | ICD-10-CM | POA: Diagnosis not present

## 2016-02-29 DIAGNOSIS — D509 Iron deficiency anemia, unspecified: Principal | ICD-10-CM | POA: Insufficient documentation

## 2016-02-29 DIAGNOSIS — Z955 Presence of coronary angioplasty implant and graft: Secondary | ICD-10-CM | POA: Insufficient documentation

## 2016-02-29 DIAGNOSIS — Z79899 Other long term (current) drug therapy: Secondary | ICD-10-CM | POA: Insufficient documentation

## 2016-02-29 DIAGNOSIS — Q438 Other specified congenital malformations of intestine: Secondary | ICD-10-CM | POA: Insufficient documentation

## 2016-02-29 DIAGNOSIS — Z87891 Personal history of nicotine dependence: Secondary | ICD-10-CM | POA: Diagnosis not present

## 2016-02-29 DIAGNOSIS — Z7984 Long term (current) use of oral hypoglycemic drugs: Secondary | ICD-10-CM | POA: Diagnosis not present

## 2016-02-29 DIAGNOSIS — K5730 Diverticulosis of large intestine without perforation or abscess without bleeding: Secondary | ICD-10-CM | POA: Diagnosis not present

## 2016-02-29 DIAGNOSIS — E785 Hyperlipidemia, unspecified: Secondary | ICD-10-CM | POA: Insufficient documentation

## 2016-02-29 DIAGNOSIS — K648 Other hemorrhoids: Secondary | ICD-10-CM | POA: Insufficient documentation

## 2016-02-29 DIAGNOSIS — K259 Gastric ulcer, unspecified as acute or chronic, without hemorrhage or perforation: Secondary | ICD-10-CM | POA: Diagnosis not present

## 2016-02-29 DIAGNOSIS — K31819 Angiodysplasia of stomach and duodenum without bleeding: Secondary | ICD-10-CM | POA: Insufficient documentation

## 2016-02-29 DIAGNOSIS — I10 Essential (primary) hypertension: Secondary | ICD-10-CM | POA: Diagnosis present

## 2016-02-29 DIAGNOSIS — D649 Anemia, unspecified: Secondary | ICD-10-CM | POA: Diagnosis not present

## 2016-02-29 DIAGNOSIS — K219 Gastro-esophageal reflux disease without esophagitis: Secondary | ICD-10-CM | POA: Diagnosis not present

## 2016-02-29 DIAGNOSIS — K2950 Unspecified chronic gastritis without bleeding: Secondary | ICD-10-CM | POA: Diagnosis not present

## 2016-02-29 DIAGNOSIS — R0600 Dyspnea, unspecified: Secondary | ICD-10-CM | POA: Diagnosis not present

## 2016-02-29 DIAGNOSIS — E649 Sequelae of unspecified nutritional deficiency: Secondary | ICD-10-CM | POA: Diagnosis not present

## 2016-02-29 DIAGNOSIS — K2980 Duodenitis without bleeding: Secondary | ICD-10-CM | POA: Diagnosis not present

## 2016-02-29 HISTORY — DX: Presence of coronary angioplasty implant and graft: Z95.5

## 2016-02-29 LAB — ABO/RH: ABO/RH(D): O POS

## 2016-02-29 LAB — CBC WITH DIFFERENTIAL/PLATELET
Basophils Absolute: 0 K/uL (ref 0.0–0.1)
Basophils Relative: 1 %
Eosinophils Absolute: 0.4 K/uL (ref 0.0–0.7)
Eosinophils Relative: 5 %
HCT: 24.6 % — ABNORMAL LOW (ref 36.0–46.0)
Hemoglobin: 6.6 g/dL — CL (ref 12.0–15.0)
Lymphocytes Relative: 21 %
Lymphs Abs: 1.8 K/uL (ref 0.7–4.0)
MCH: 15.3 pg — ABNORMAL LOW (ref 26.0–34.0)
MCHC: 26.8 g/dL — ABNORMAL LOW (ref 30.0–36.0)
MCV: 57.2 fL — ABNORMAL LOW (ref 78.0–100.0)
Monocytes Absolute: 0.8 K/uL (ref 0.1–1.0)
Monocytes Relative: 9 %
Neutro Abs: 5.8 K/uL (ref 1.7–7.7)
Neutrophils Relative %: 65 %
Platelets: 421 K/uL — ABNORMAL HIGH (ref 150–400)
RBC: 4.3 MIL/uL (ref 3.87–5.11)
RDW: 20.3 % — ABNORMAL HIGH (ref 11.5–15.5)
WBC: 8.8 K/uL (ref 4.0–10.5)

## 2016-02-29 LAB — I-STAT TROPONIN, ED: Troponin i, poc: 0 ng/mL (ref 0.00–0.08)

## 2016-02-29 LAB — COMPREHENSIVE METABOLIC PANEL
ALBUMIN: 3.9 g/dL (ref 3.5–5.0)
ALK PHOS: 73 U/L (ref 38–126)
ALT: 11 U/L — AB (ref 14–54)
AST: 17 U/L (ref 15–41)
Anion gap: 6 (ref 5–15)
BUN: 11 mg/dL (ref 6–20)
CALCIUM: 8.7 mg/dL — AB (ref 8.9–10.3)
CO2: 25 mmol/L (ref 22–32)
CREATININE: 0.83 mg/dL (ref 0.44–1.00)
Chloride: 104 mmol/L (ref 101–111)
GFR calc Af Amer: >60 mL/min (ref >60)
GFR calc non Af Amer: >60 mL/min (ref >60)
GLUCOSE: 89 mg/dL (ref 65–99)
Potassium: 3.4 mmol/L — ABNORMAL LOW (ref 3.5–5.1)
SODIUM: 135 mmol/L (ref 135–145)
Total Bilirubin: 0.2 mg/dL — ABNORMAL LOW (ref 0.3–1.2)
Total Protein: 6.8 g/dL (ref 6.5–8.1)

## 2016-02-29 LAB — POC OCCULT BLOOD, ED: Fecal Occult Bld: NEGATIVE

## 2016-02-29 LAB — BRAIN NATRIURETIC PEPTIDE: B Natriuretic Peptide: 57 pg/mL (ref 0.0–100.0)

## 2016-02-29 LAB — PREPARE RBC (CROSSMATCH)

## 2016-02-29 MED ORDER — ONDANSETRON HCL 4 MG PO TABS: 4.0000 mg | ORAL_TABLET | Freq: Four times a day (QID) | ORAL | Status: DC | PRN

## 2016-02-29 MED ORDER — METFORMIN HCL 500 MG PO TABS
500.0000 mg | ORAL_TABLET | Freq: Every day | ORAL | Status: DC
  Administered 2016-03-01 – 2016-03-03 (×3): 500 mg via ORAL
  Filled 2016-02-29 (×3): qty 1

## 2016-02-29 MED ORDER — ACETAMINOPHEN 325 MG PO TABS: 650.0000 mg | ORAL_TABLET | Freq: Four times a day (QID) | ORAL | Status: DC | PRN

## 2016-02-29 MED ORDER — AMLODIPINE BESYLATE 5 MG PO TABS
10.0000 mg | ORAL_TABLET | Freq: Every day | ORAL | Status: DC
  Administered 2016-03-01 – 2016-03-03 (×3): 10 mg via ORAL
  Filled 2016-02-29 (×3): qty 2

## 2016-02-29 MED ORDER — ACETAMINOPHEN 650 MG RE SUPP: 650.0000 mg | Freq: Four times a day (QID) | RECTAL | Status: DC | PRN

## 2016-02-29 MED ORDER — ASPIRIN 81 MG PO CHEW
81.0000 mg | CHEWABLE_TABLET | Freq: Every day | ORAL | Status: DC
  Administered 2016-03-01 – 2016-03-03 (×2): 81 mg via ORAL
  Filled 2016-02-29 (×3): qty 1

## 2016-02-29 MED ORDER — IRBESARTAN 300 MG PO TABS
300.0000 mg | ORAL_TABLET | Freq: Every day | ORAL | Status: DC
  Administered 2016-03-01 – 2016-03-03 (×3): 300 mg via ORAL
  Filled 2016-02-29 (×3): qty 1

## 2016-02-29 MED ORDER — NITROGLYCERIN 0.4 MG SL SUBL: 0.4000 mg | SUBLINGUAL_TABLET | SUBLINGUAL | 6 refills | Status: DC | PRN

## 2016-02-29 MED ORDER — SODIUM CHLORIDE 0.9 % IV SOLN
10.0000 mL/h | Freq: Once | INTRAVENOUS | Status: AC
  Administered 2016-02-29: 10 mL/h via INTRAVENOUS

## 2016-02-29 MED ORDER — ROSUVASTATIN CALCIUM 20 MG PO TABS: 20.0000 mg | ORAL_TABLET | Freq: Every day | ORAL | 3 refills | Status: DC

## 2016-02-29 MED ORDER — FERROUS SULFATE 325 (65 FE) MG PO TABS
325.0000 mg | ORAL_TABLET | Freq: Three times a day (TID) | ORAL | Status: DC
  Administered 2016-03-01: 325 mg via ORAL
  Filled 2016-02-29: qty 1

## 2016-02-29 MED ORDER — CARVEDILOL 3.125 MG PO TABS
6.2500 mg | ORAL_TABLET | Freq: Every day | ORAL | Status: DC
  Administered 2016-03-01 – 2016-03-03 (×3): 6.25 mg via ORAL
  Filled 2016-02-29 (×3): qty 2

## 2016-02-29 MED ORDER — ROSUVASTATIN CALCIUM 20 MG PO TABS
20.0000 mg | ORAL_TABLET | Freq: Every day | ORAL | Status: DC
  Administered 2016-02-29 – 2016-03-03 (×4): 20 mg via ORAL
  Filled 2016-02-29 (×4): qty 1

## 2016-02-29 MED ORDER — ONDANSETRON HCL 4 MG/2ML IJ SOLN
4.0000 mg | Freq: Four times a day (QID) | INTRAMUSCULAR | Status: DC | PRN
  Administered 2016-03-02: 4 mg via INTRAVENOUS
  Filled 2016-02-29: qty 2

## 2016-02-29 MED ORDER — DOCUSATE SODIUM 100 MG PO CAPS
100.0000 mg | ORAL_CAPSULE | Freq: Two times a day (BID) | ORAL | Status: DC
  Administered 2016-02-29 – 2016-03-03 (×5): 100 mg via ORAL
  Filled 2016-02-29 (×5): qty 1

## 2016-02-29 NOTE — ED Notes (Signed)
CRITICAL VALUE ALERT  Critical value received:  Hemoglobin 6.6  Date of notification:  02/29/16  Time of notification:  [id number]  Critical value read back:Yes.    Nurse who received alert:  Charmayne Sheer, RN  MD notified (1st page):  Long

## 2016-02-29 NOTE — Telephone Encounter (Signed)
Refills done.

## 2016-02-29 NOTE — H&P (Addendum)
History and Physical  Nancy BOCHICCHIO [id number] DOB: Jan 08, 1941 DOA: 02/29/2016  Referring physician: Dr Nancy Baltimore, ED physician PCP: Elyn Peers, MD  Outpatient Specialists:   Dr Harl Bowie (Cardiology)  Chief Complaint: Dizziness, lightheadedness  HPI: Nancy Liu is a 75 y.o. female with a history of CAD with her mental stents placed in right coronary artery in 2001, diabetes on low-dose metformin, hyperlipidemia, hypertension. Patient seen for dizziness and lightheadedness and shortness of breath that started 2 months ago and has increased over the past few weeks. Shortness of breath improves with rest. Dyspneic to 15 feet. Denies fevers, chills, nausea, vomiting, melena, rectal bleeding, vaginal bleeding. Patient status post partial hysterectomy. Patient saw her primary care physician today who ordered a CBC and found her to be anemic. She was sent to the emergency department for further evaluation   Review of Systems:   Pt denies any fevers, chills, nausea, vomiting, diarrhea, constipation, abdominal pain, orthopnea, cough, wheezing, palpitations, headache, vision changes, melena, rectal bleeding.  Review of systems are otherwise negative  Past Medical History:  Diagnosis Date  . Arteriosclerotic cardiovascular disease (ASCVD) 2001   Bare-metal stent placed in the right coronary artery in 12/01; residual 50% lesion of the first diagonal and mid LAD  . Cerebrovascular disease   . Diabetes mellitus    excellent control with a low-dose of a single oral agent  . Hyperlipidemia   . Hypertension   . Tobacco abuse, in remission    35 pack years; Quit in 1980   Past Surgical History:  Procedure Laterality Date  . COLONOSCOPY  2006  . DILATION AND CURETTAGE OF UTERUS    . INGUINAL HERNIA REPAIR  1970s   Right  . VAGINAL HYSTERECTOMY  1980   Unilateral oophorectomy   Social History:  reports that she quit smoking about 35 years ago. Her smoking use included Cigarettes. She  started smoking about 50 years ago. She has a 18.00 pack-year smoking history. She has never used smokeless tobacco. She reports that she does not drink alcohol or use drugs. Patient lives at [place] [facility]  . Shellfish Allergy     Unknown reaction    Family History  Problem Relation Age of Onset  . Diabetes Sister   . Heart disease Mother     also hypertension and asthma  . Cancer Mother       Prior to Admission medications   Medication Sig Start Date End Date Taking? Authorizing Provider  amLODipine (NORVASC) 10 MG tablet Take 1 tablet (10 mg total) by mouth daily. 01/22/16  Yes Arnoldo Lenis, MD  aspirin 81 MG tablet Take 81 mg by mouth daily.     Yes Historical Provider, MD  carvedilol (COREG) 3.125 MG tablet TAKE ONE TABLET BY MOUTH TWICE DAILY Patient taking differently: TAKE TWO TABLETS BY MOUTH DAILY 08/31/15  Yes Arnoldo Lenis, MD  irbesartan (AVAPRO) 300 MG tablet Take 1 tablet (300 mg total) by mouth daily. 10/16/15  Yes Arnoldo Lenis, MD  metFORMIN (GLUCOPHAGE) 500 MG tablet Take 500 mg by mouth daily with breakfast.   Yes Historical Provider, MD  nitroGLYCERIN (NITROSTAT) 0.4 MG SL tablet Place 1 tablet (0.4 mg total) under the tongue every 5 (five) minutes as needed for chest pain. 02/29/16 02/28/17 Yes Lendon Colonel, NP  rosuvastatin (CRESTOR) 20 MG tablet Take 1 tablet (20 mg total) by mouth daily. 02/29/16  Yes Lendon Colonel, NP    Physical Exam: BP 145/74 (BP  Location: Left Arm)   Pulse 76   Temp 98.1 F (36.7 C) (Oral)   Resp 16   Ht 5' 2.5" (1.588 m)   Wt 77.6 kg (171 lb)   SpO2 100%   BMI 30.78 kg/m   General: Older black female. Awake and alert and oriented x3. No acute cardiopulmonary distress.  HEENT: Normocephalic atraumatic.  Right and left ears normal in appearance.  Pupils equal, round, reactive to light. Extraocular muscles are intact. Sclerae anicteric and noninjected.  Moist mucosal membranes. No mucosal  lesions.  Neck: Neck supple without lymphadenopathy. No carotid bruits. No masses palpated.  Cardiovascular: Regular rate with normal S1-S2 sounds. No murmurs, rubs, gallops auscultated. No JVD.  Respiratory: Good respiratory effort with no wheezes, rales, rhonchi. Lungs clear to auscultation bilaterally.  No accessory muscle use. Abdomen: Soft, nontender, nondistended. Active bowel sounds. No masses or hepatosplenomegaly  Skin: No rashes, lesions, or ulcerations.  Dry, warm to touch. 2+ dorsalis pedis and radial pulses. Musculoskeletal: No calf or leg pain. All major joints not erythematous nontender.  No upper or lower joint deformation.  Good ROM.  No contractures  Psychiatric: Intact judgment and insight. Pleasant and cooperative. Neurologic: No focal neurological deficits. Strength is 5/5 and symmetric in upper and lower extremities.  Cranial nerves II through XII are grossly intact.           Labs on Admission: I have personally reviewed following labs and imaging studies  CBC:  Recent Labs Lab 02/29/16 1645  WBC 8.8  NEUTROABS 5.8  HGB 6.6*  HCT 24.6*  MCV 57.2*  PLT [id number]*   Basic Metabolic Panel:  Recent Labs Lab 02/29/16 1645  NA 135  K 3.4*  CL 104  CO2 25  GLUCOSE 89  BUN 11  CREATININE 0.83  CALCIUM 8.7*   GFR: Estimated Creatinine Clearance: 58.9 mL/min (by C-G formula based on SCr of 0.83 mg/dL). Liver Function Tests:  Recent Labs Lab 02/29/16 1645  AST 17  ALT 11*  ALKPHOS 73  BILITOT 0.2*  PROT 6.8  ALBUMIN 3.9   No results for input(s): LIPASE, AMYLASE in the last 168 hours. No results for input(s): AMMONIA in the last 168 hours. Coagulation Profile: No results for input(s): INR, PROTIME in the last 168 hours. Cardiac Enzymes: No results for input(s): CKTOTAL, CKMB, CKMBINDEX, TROPONINI in the last 168 hours. BNP (last 3 results) No results for input(s): PROBNP in the last 8760 hours. HbA1C: No results for input(s): HGBA1C in the last 72  hours. CBG: No results for input(s): GLUCAP in the last 168 hours. Lipid Profile: No results for input(s): CHOL, HDL, LDLCALC, TRIG, CHOLHDL, LDLDIRECT in the last 72 hours. Thyroid Function Tests: No results for input(s): TSH, T4TOTAL, FREET4, T3FREE, THYROIDAB in the last 72 hours. Anemia Panel: No results for input(s): VITAMINB12, FOLATE, FERRITIN, TIBC, IRON, RETICCTPCT in the last 72 hours. Urine analysis: No results found for: COLORURINE, APPEARANCEUR, LABSPEC, PHURINE, GLUCOSEU, HGBUR, BILIRUBINUR, KETONESUR, PROTEINUR, UROBILINOGEN, NITRITE, LEUKOCYTESUR Sepsis Labs: @LABRCNTIP (procalcitonin:4,lacticidven:4) )No results found for this or any previous visit (from the past 240 hour(s)).   Radiological Exams on Admission: Dg Chest 2 View  Result Date: 02/29/2016 CLINICAL DATA:  Dyspnea EXAM: CHEST  2 VIEW COMPARISON:  08/27/2015 chest radiograph. FINDINGS: Stable cardiomediastinal silhouette with normal heart size. No pneumothorax. No pleural effusion. Stable minimal scarring in the anterior left lung base. No pulmonary edema. No acute consolidative airspace disease. IMPRESSION: Minimal scarring at the anterior left lung base, stable. No active cardiopulmonary disease.  Electronically Signed   By: Ilona Sorrel M.D.   On: 02/29/2016 17:38    EKG: Independently reviewed. Sinus rhythm. No acute ST changes.  Assessment/Plan: Principal Problem:   Symptomatic anemia Active Problems:   Microcytic anemia   Essential hypertension    This patient was discussed with the ED physician, including pertinent vitals, physical exam findings, labs, and imaging.  We also discussed care given by the ED provider.  #1 symptomatic anemia  Observation  Patient transfused 2 units of blood.   Fecal occult Blood tests.  Recheck CBC in the morning  As colonoscopy scheduled in middle of October  Start oral iron supplementation  We'll add Colace to help avoid constipation #2 essential  hypertension  Continue home medications #3 CAD  Continue Crestor #4 microcytic anemia  As the patient has microcytic anemia, will not obtain vitamin 0000000, folic acid, TSH as these are microcytic anemias.  Patient receiving blood - unable to do iron stores on patient.  DVT prophylaxis: SCDs Consultants: None Code Status: Full code Family Communication: Family in the room  Disposition Plan: Return home following blood transfusion.   Truett Mainland, DO [facility] Pager [phone]  If 7PM-7AM, please contact night-coverage [email or web address] Password [email or web address]

## 2016-02-29 NOTE — ED Notes (Signed)
Pt denies any pain or sob at this time.

## 2016-02-29 NOTE — ED Triage Notes (Signed)
Pt sent by Dr. Fransico Setters office for blood transfusion. Hgb 6.7  When checked at Dr's office. Pt states she has been having SOB and weakness.

## 2016-02-29 NOTE — Telephone Encounter (Signed)
Pt went to [facility] to pick up her medicine and they told her they had not received anything for her yet.   nitroGLYCERIN (NITROSTAT) 0.4 MG SL tablet [id number] ENDED  And the generic of Crestor was supposed to be sent per the pt

## 2016-02-29 NOTE — ED Provider Notes (Signed)
Emergency Department Provider Note   I have reviewed the triage vital signs and the nursing notes.   HISTORY  Chief Complaint Anemia   HPI Nancy Liu is a 75 y.o. female with PMH of CAD, CVA, DM, HLD, HTN presents to the emergency department for evaluation of dyspnea, exertional chest pressure, and anemia. The patient went to her primary care physician this week for routine appointment. She discussed with him that she needed more short of breath over the last week. Her shortness of breath occurs particularly with walking. She does have some associated chest pressure that resolved when she rests. Patient denies any pain or difficulty breathing at rest. She denies any past medical history of blood transfusion or known anemia. The patient is not on anticoagulation. She has not noticed any grossly bloody or black stools. No near-syncope or syncope.    Past Medical History:  Diagnosis Date  . Arteriosclerotic cardiovascular disease (ASCVD) 2001   Bare-metal stent placed in the right coronary artery in 12/01; residual 50% lesion of the first diagonal and mid LAD  . Cerebrovascular disease   . Diabetes mellitus    excellent control with a low-dose of a single oral agent  . History of right coronary artery stent placement 2000  . Hyperlipidemia   . Hypertension   . Tobacco abuse, in remission    35 pack years; Quit in 1980    Patient Active Problem List   Diagnosis Date Noted  . Symptomatic anemia 02/29/2016  . Weight loss 04/01/2012  . Microcytic anemia 01/31/2010  . Cerebrovascular disease 01/31/2010  . Arteriosclerotic cardiovascular disease (ASCVD) 01/03/2009  . HYPERLIPIDEMIA 10/22/2007  . Essential hypertension 10/22/2007    Past Surgical History:  Procedure Laterality Date  . COLONOSCOPY  2006  . DILATION AND CURETTAGE OF UTERUS    . INGUINAL HERNIA REPAIR  1970s   Right  . VAGINAL HYSTERECTOMY  1980   Unilateral oophorectomy      Allergies Shellfish  allergy  Family History  Problem Relation Age of Onset  . Diabetes Sister   . Heart disease Mother     also hypertension and asthma  . Cancer Mother     Social History Social History  Substance Use Topics  . Smoking status: Former Smoker    Packs/day: 1.00    Years: 18.00    Types: Cigarettes    Start date: 06/02/1965    Quit date: 06/09/1980  . Smokeless tobacco: Never Used  . Alcohol use No    Review of Systems  Constitutional: No fever/chills Eyes: No visual changes. ENT: No sore throat. Cardiovascular: Positive chest pressure with exertion.  Respiratory: Positive shortness of breath. Gastrointestinal: No abdominal pain. No nausea, no vomiting. No diarrhea.  No constipation. Genitourinary: Negative for dysuria. Musculoskeletal: Negative for back pain. Skin: Negative for rash. Neurological: Negative for headaches, focal weakness or numbness.  10-point ROS otherwise negative.  ____________________________________________   PHYSICAL EXAM:  VITAL SIGNS: ED Triage Vitals  Enc Vitals Group     BP 02/29/16 1617 136/66     Pulse Rate 02/29/16 1617 85     Resp 02/29/16 1617 18     Temp 02/29/16 1617 98 F (36.7 C)     Temp Source 02/29/16 1617 Oral     SpO2 02/29/16 1617 100 %     Weight 02/29/16 1615 171 lb (77.6 kg)     Height 02/29/16 1615 5' 2.5" (1.588 m)   Constitutional: Alert and oriented. Well appearing and in no  acute distress. Eyes: Conjunctivae are normal.  Head: Atraumatic. Nose: No congestion/rhinnorhea. Mouth/Throat: Mucous membranes are moist.  Oropharynx non-erythematous. Neck: No stridor.   Cardiovascular: Normal rate, regular rhythm. Good peripheral circulation. Grossly normal heart sounds.   Respiratory: Normal respiratory effort.  No retractions. Lungs CTAB. Gastrointestinal: Soft and nontender. No distention. Brown, hemoccult negative stool.  Musculoskeletal: No lower extremity tenderness nor edema. No gross deformities of  extremities. Neurologic:  Normal speech and language. No gross focal neurologic deficits are appreciated.  Skin:  Skin is warm, dry and intact. No rash noted. Psychiatric: Mood and affect are normal. Speech and behavior are normal.  ____________________________________________   LABS (all labs ordered are listed, but only abnormal results are displayed)  Labs Reviewed  CBC WITH DIFFERENTIAL/PLATELET - Abnormal; Notable for the following:       Result Value   Hemoglobin 6.6 (*)    HCT 24.6 (*)    MCV 57.2 (*)    MCH 15.3 (*)    MCHC 26.8 (*)    RDW 20.3 (*)    Platelets 421 (*)    All other components within normal limits  COMPREHENSIVE METABOLIC PANEL - Abnormal; Notable for the following:    Potassium 3.4 (*)    Calcium 8.7 (*)    ALT 11 (*)    Total Bilirubin 0.2 (*)    All other components within normal limits  CBC - Abnormal; Notable for the following:    Hemoglobin 8.8 (*)    HCT 30.7 (*)    MCV 62.8 (*)    MCH 18.0 (*)    MCHC 28.7 (*)    RDW 26.1 (*)    All other components within normal limits  BRAIN NATRIURETIC PEPTIDE  I-STAT TROPOININ, ED  POC OCCULT BLOOD, ED  TYPE AND SCREEN  PREPARE RBC (CROSSMATCH)  ABO/RH   ____________________________________________  EKG   EKG Interpretation  Date/Time:  Friday February 29 2016 17:05:06 EDT Ventricular Rate:  81 PR Interval:    QRS Duration: 87 QT Interval:  371 QTC Calculation: 431 R Axis:   48 Text Interpretation:  Sinus rhythm Minimal ST depression, inferior leads No STEMI. Compared to prior Confirmed by Ivyanna Sibert MD, Jayvin Hurrell [phone]) on 02/29/2016 5:12:19 PM       ______________________________  RADIOLOGY  Dg Chest 2 View  Result Date: 02/29/2016 CLINICAL DATA:  Dyspnea EXAM: CHEST  2 VIEW COMPARISON:  08/27/2015 chest radiograph. FINDINGS: Stable cardiomediastinal silhouette with normal heart size. No pneumothorax. No pleural effusion. Stable minimal scarring in the anterior left lung base. No  pulmonary edema. No acute consolidative airspace disease. IMPRESSION: Minimal scarring at the anterior left lung base, stable. No active cardiopulmonary disease. Electronically Signed   By: Ilona Sorrel M.D.   On: 02/29/2016 17:38    ____________________________________________   PROCEDURES  Procedure(s) performed:   Procedures  CRITICAL CARE Performed by: Margette Fast Total critical care time: 32 minutes Critical care time was exclusive of separately billable procedures and treating other patients. Critical care was necessary to treat or prevent imminent or life-threatening deterioration. Critical care was time spent personally by me on the following activities: development of treatment plan with patient and/or surrogate as well as nursing, discussions with consultants, evaluation of patient's response to treatment, examination of patient, obtaining history from patient or surrogate, ordering and performing treatments and interventions, ordering and review of laboratory studies, ordering and review of radiographic studies, pulse oximetry and re-evaluation of patient's condition.  Patient with symptomatic anemia requiring blood transfusion  in the ED.   Nanda Quinton, MD Emergency Medicine  ____________________________________________   INITIAL IMPRESSION / ASSESSMENT AND PLAN / ED COURSE  Pertinent labs & imaging results that were available during my care of the patient were reviewed by me and considered in my medical decision making (see chart for details).  Patient resents to the emergency department for evaluation of exertional shortness of breath and chest pressure with anemia noted at her outpatient physician appointment. Plan to repeat labs. Patient has brown Hemoccult-negative stool on exam.   06:21 PM Patient with significant anemia on repeat labs. Plan for blood transfusion and admission for further evaluation of anemia and trending of hemoglobin after transfusion.  Discussed the risks and benefits in detail with the patient in terms of receiving a blood transfusion and she is in agreement. Hemodynamically she is stable with no symptoms unless she is ambulatory.   Discussed patient's case with hospitalist, Dr. Nehemiah Settle.  Recommend admission to obs, med-surg bed.  I will place holding orders per their request. Patient and family (if present) updated with plan. Care transferred to hospitalist service.  I reviewed all nursing notes, vitals, pertinent old records, EKGs, labs, imaging (as available).  ____________________________________________  FINAL CLINICAL IMPRESSION(S) / ED DIAGNOSES  Final diagnoses:  Anemia, unspecified anemia type     MEDICATIONS GIVEN DURING THIS VISIT:  Medications  metFORMIN (GLUCOPHAGE) tablet 500 mg (not administered)  rosuvastatin (CRESTOR) tablet 20 mg (20 mg Oral Given 02/29/16 2202)  amLODipine (NORVASC) tablet 10 mg (not administered)  irbesartan (AVAPRO) tablet 300 mg (not administered)  carvedilol (COREG) tablet 6.25 mg (not administered)  aspirin chewable tablet 81 mg (not administered)  acetaminophen (TYLENOL) tablet 650 mg (not administered)    Or  acetaminophen (TYLENOL) suppository 650 mg (not administered)  ondansetron (ZOFRAN) tablet 4 mg (not administered)    Or  ondansetron (ZOFRAN) injection 4 mg (not administered)  ferrous sulfate tablet 325 mg (not administered)  docusate sodium (COLACE) capsule 100 mg (100 mg Oral Given 02/29/16 2202)  0.9 %  sodium chloride infusion (10 mL/hr Intravenous New Bag/Given 02/29/16 1943)     NEW OUTPATIENT MEDICATIONS STARTED DURING THIS VISIT:  None   Note:  This document was prepared using Dragon voice recognition software and may include unintentional dictation errors.  Nanda Quinton, MD Emergency Medicine  Margette Fast, MD 03/01/16 [id number]

## 2016-03-01 DIAGNOSIS — D649 Anemia, unspecified: Secondary | ICD-10-CM | POA: Diagnosis not present

## 2016-03-01 DIAGNOSIS — I10 Essential (primary) hypertension: Secondary | ICD-10-CM | POA: Diagnosis not present

## 2016-03-01 DIAGNOSIS — D509 Iron deficiency anemia, unspecified: Secondary | ICD-10-CM | POA: Diagnosis not present

## 2016-03-01 LAB — TYPE AND SCREEN
ABO/RH(D): O POS
Antibody Screen: NEGATIVE
UNIT DIVISION: 0
Unit division: 0

## 2016-03-01 LAB — CBC
HCT: 30.7 % — ABNORMAL LOW (ref 36.0–46.0)
HEMOGLOBIN: 8.8 g/dL — AB (ref 12.0–15.0)
MCH: 18 pg — AB (ref 26.0–34.0)
MCHC: 28.7 g/dL — AB (ref 30.0–36.0)
MCV: 62.8 fL — AB (ref 78.0–100.0)
PLATELETS: 356 10*3/uL (ref 150–400)
RBC: 4.89 MIL/uL (ref 3.87–5.11)
RDW: 26.1 % — ABNORMAL HIGH (ref 11.5–15.5)
WBC: 7.9 10*3/uL (ref 4.0–10.5)

## 2016-03-01 LAB — VITAMIN B12: Vitamin B-12: 210 pg/mL (ref 180–914)

## 2016-03-01 MED ORDER — PANTOPRAZOLE SODIUM 40 MG PO TBEC
40.0000 mg | DELAYED_RELEASE_TABLET | Freq: Every day | ORAL | Status: DC
  Administered 2016-03-02 – 2016-03-03 (×2): 40 mg via ORAL
  Filled 2016-03-01 (×2): qty 1

## 2016-03-01 MED ORDER — SODIUM CHLORIDE 0.9% FLUSH
3.0000 mL | Freq: Two times a day (BID) | INTRAVENOUS | Status: DC
  Administered 2016-03-01 – 2016-03-03 (×4): 3 mL via INTRAVENOUS

## 2016-03-01 MED ORDER — SODIUM CHLORIDE 0.9% FLUSH: 3.0000 mL | INTRAVENOUS | Status: DC | PRN

## 2016-03-01 MED ORDER — SODIUM CHLORIDE 0.9 % IV SOLN: 250.0000 mL | INTRAVENOUS | Status: DC | PRN

## 2016-03-01 NOTE — Progress Notes (Signed)
PROGRESS NOTE    Nancy Liu  [id number] DOB: 03/24/1941 DOA: 02/29/2016 PCP: Nancy Peers, MD    Brief Narrative: patient is a 75 yo female with hx of DM on Metformin (metformin can cause low B12), HTN, CAD s/p stent placement, admitted for profound microcytic anemia, suspicious for slow GI bleeding.   She was transfused 2 units of PRBC's bringing her H and H from 6.6 g per dL to 8.8 g per L.    Assessment & Plan:   Principal Problem:   Symptomatic anemia Active Problems:   Microcytic anemia   Essential hypertension   Anemia:  Supsect she has a low GI bleed.  Will follow H and H tomorrow.  D/C iron supplement in case she would benefit getting endoscopy during this hospitalization.   Will change diet to clear liquid. Start PPI.  Hold ASA.  Will obtain B12 level as she has been on Metformin and could have a combined iron and B12 deficiency.   DM: Continue with current Tx.   HTN:  Stable.    DVT prophylaxis: SCD. Code Status: FULL CODE.  Family Communication: grandson.  Disposition Plan: Home when appropriate.   Consultants:   None.   Procedures:   None.   Antimicrobials: Anti-infectives    None       Subjective:   Feeling well.    Objective: Vitals:   02/29/16 2212 02/29/16 2232 03/01/16 0012 03/01/16 0644  BP: (!) 154/69 139/83 (!) 149/55 137/75  Pulse: 82 82 79 71  Resp: 20 18 18 18   Temp: 98.6 F (37 C) 98.4 F (36.9 C) 98.2 F (36.8 C) 98.2 F (36.8 C)  TempSrc: Oral Oral Oral Oral  SpO2: 100% 100% 99% 100%  Weight:      Height:        Intake/Output Summary (Last 24 hours) at 03/01/16 1227 Last data filed at 03/01/16 1050  Gross per 24 hour  Intake             1058 ml  Output                0 ml  Net             1058 ml   Filed Weights   02/29/16 1615  Weight: 77.6 kg (171 lb)    Examination:  General exam: Appears calm and comfortable  Respiratory system: Clear to auscultation. Respiratory effort normal. Cardiovascular  system: S1 & S2 heard, RRR. No JVD, murmurs, rubs, gallops or clicks. No pedal edema. Gastrointestinal system: Abdomen is nondistended, soft and nontender. No organomegaly or masses felt. Normal bowel sounds heard. Central nervous system: Alert and oriented. No focal neurological deficits. Extremities: Symmetric 5 x 5 power. Skin: No rashes, lesions or ulcers Psychiatry: Judgement and insight appear normal. Mood & affect appropriate.   Data Reviewed: I have personally reviewed following labs and imaging studies  CBC:  Recent Labs Lab 02/29/16 1645 03/01/16 0605  WBC 8.8 7.9  NEUTROABS 5.8  --   HGB 6.6* 8.8*  HCT 24.6* 30.7*  MCV 57.2* 62.8*  PLT 421* [id number]   Basic Metabolic Panel:  Recent Labs Lab 02/29/16 1645  NA 135  K 3.4*  CL 104  CO2 25  GLUCOSE 89  BUN 11  CREATININE 0.83  CALCIUM 8.7*   GFR: Estimated Creatinine Clearance: 58.9 mL/min (by C-G formula based on SCr of 0.83 mg/dL). Liver Function Tests:  Recent Labs Lab 02/29/16 1645  AST 17  ALT 11*  ALKPHOS  28  BILITOT 0.2*  PROT 6.8  ALBUMIN 3.9    Radiology Studies: Dg Chest 2 View  Result Date: 02/29/2016 CLINICAL DATA:  Dyspnea EXAM: CHEST  2 VIEW COMPARISON:  08/27/2015 chest radiograph. FINDINGS: Stable cardiomediastinal silhouette with normal heart size. No pneumothorax. No pleural effusion. Stable minimal scarring in the anterior left lung base. No pulmonary edema. No acute consolidative airspace disease. IMPRESSION: Minimal scarring at the anterior left lung base, stable. No active cardiopulmonary disease. Electronically Signed   By: Ilona Sorrel M.D.   On: 02/29/2016 17:38    Scheduled Meds: . amLODipine  10 mg Oral Daily  . aspirin  81 mg Oral Daily  . carvedilol  6.25 mg Oral Q breakfast  . docusate sodium  100 mg Oral BID  . irbesartan  300 mg Oral Daily  . metFORMIN  500 mg Oral Q breakfast  . rosuvastatin  20 mg Oral q1800  . sodium chloride flush  3 mL Intravenous Q12H    Continuous Infusions:    LOS: 0 days   Nancy Barrell, MD FACP Hospitalist.   If 7PM-7AM, please contact night-coverage [email or web address] Password [facility] 03/01/2016, 12:27 PM

## 2016-03-02 ENCOUNTER — Encounter (HOSPITAL_COMMUNITY): Payer: Self-pay | Admitting: Gastroenterology

## 2016-03-02 DIAGNOSIS — D649 Anemia, unspecified: Secondary | ICD-10-CM

## 2016-03-02 DIAGNOSIS — D509 Iron deficiency anemia, unspecified: Secondary | ICD-10-CM | POA: Diagnosis not present

## 2016-03-02 DIAGNOSIS — I10 Essential (primary) hypertension: Secondary | ICD-10-CM | POA: Diagnosis not present

## 2016-03-02 LAB — CBC
HEMATOCRIT: 31.8 % — AB (ref 36.0–46.0)
Hemoglobin: 9.2 g/dL — ABNORMAL LOW (ref 12.0–15.0)
MCH: 18 pg — ABNORMAL LOW (ref 26.0–34.0)
MCHC: 28.9 g/dL — ABNORMAL LOW (ref 30.0–36.0)
MCV: 62.2 fL — ABNORMAL LOW (ref 78.0–100.0)
PLATELETS: 386 10*3/uL (ref 150–400)
RBC: 5.11 MIL/uL (ref 3.87–5.11)
RDW: 26.1 % — AB (ref 11.5–15.5)
WBC: 8 10*3/uL (ref 4.0–10.5)

## 2016-03-02 MED ORDER — BISACODYL 5 MG PO TBEC: 10.0000 mg | DELAYED_RELEASE_TABLET | Freq: Once | ORAL | Status: DC

## 2016-03-02 MED ORDER — PEG 3350-KCL-NA BICARB-NACL 420 G PO SOLR
2000.0000 mL | Freq: Once | ORAL | Status: AC
  Administered 2016-03-02: 2000 mL via ORAL
  Filled 2016-03-02: qty 4000

## 2016-03-02 MED ORDER — BISACODYL 5 MG PO TBEC
10.0000 mg | DELAYED_RELEASE_TABLET | Freq: Once | ORAL | Status: AC
  Administered 2016-03-02: 10 mg via ORAL
  Filled 2016-03-02: qty 2

## 2016-03-02 MED ORDER — SODIUM CHLORIDE 0.9 % IV SOLN
INTRAVENOUS | Status: DC
  Administered 2016-03-02: 20:00:00 via INTRAVENOUS

## 2016-03-02 MED ORDER — PEG 3350-KCL-NA BICARB-NACL 420 G PO SOLR
2000.0000 mL | Freq: Once | ORAL | Status: DC
  Filled 2016-03-02: qty 4000

## 2016-03-02 NOTE — Care Management Obs Status (Signed)
[place] NOTIFICATION   Patient Details  Name: Nancy Liu MRN: [id number] Date of Birth: 06/22/1940   Medicare Observation Status Notification Given:  Yes    Briant Sites, RN 03/02/2016, 2:15 PM

## 2016-03-02 NOTE — Progress Notes (Signed)
PROGRESS NOTE    Nancy Liu  [id number] DOB: [birth year] DOA: 02/29/2016 PCP: Elyn Peers, MD   Brief Narrative: patient is a 75 yo female with hx of DM on Metformin (metformin can cause low B12), HTN, CAD s/p stent placement, admitted for profound microcytic anemia, suspicious for slow GI bleeding.   She was transfused 2 units of PRBC's bringing her H and H from 6.6 g per dL to a stable 9 g per L.  GI was consulted and planned endoscopies this admission.    Assessment & Plan:   Principal Problem:   Symptomatic anemia Active Problems:   Microcytic anemia   Essential hypertension   Anemia:  Supsect she has a low GI bleed.  Will follow H and H tomorrow.  D/C iron supplement in case she would benefit getting endoscopy during this hospitalization.   Will change diet to clear liquid. Start PPI.  Hold ASA.  Will obtain B12 level as she has been on Metformin and could have a combined iron and B12 deficiency.  Appreciate GI's consultation.  Plan for endoscopies this admission.   DM: Continue with current Tx.   HTN:  Stable.    DVT prophylaxis: SCD. Code Status: FULL CODE.  Family Communication: grandson.  Disposition Plan: Home when appropriate.   Consultants:   GI.  Procedures:   None.    Antimicrobials: Anti-infectives    None       Subjective:   Feeling well.   Objective: Vitals:   03/01/16 1400 03/01/16 2157 03/02/16 0539 03/02/16 0843  BP: (!) 146/84 (!) 147/82 126/67 131/83  Pulse: 82 78 77 72  Resp: 18 18 18    Temp: 98.2 F (36.8 C) 98.5 F (36.9 C) 98.7 F (37.1 C)   TempSrc: Oral Oral Oral   SpO2: 99% 100% 99%   Weight:      Height:        Intake/Output Summary (Last 24 hours) at 03/02/16 1219 Last data filed at 03/01/16 1803  Gross per 24 hour  Intake              240 ml  Output                0 ml  Net              240 ml   Filed Weights   02/29/16 1615  Weight: 77.6 kg (171 lb)    Examination:  General exam:  Appears calm and comfortable  Respiratory system: Clear to auscultation. Respiratory effort normal. Cardiovascular system: S1 & S2 heard, RRR. No JVD, murmurs, rubs, gallops or clicks. No pedal edema. Gastrointestinal system: Abdomen is nondistended, soft and nontender. No organomegaly or masses felt. Normal bowel sounds heard. Central nervous system: Alert and oriented. No focal neurological deficits. Extremities: Symmetric 5 x 5 power. Skin: No rashes, lesions or ulcers Psychiatry: Judgement and insight appear normal. Mood & affect appropriate.   Data Reviewed: I have personally reviewed following labs and imaging studies  CBC:  Recent Labs Lab 02/29/16 1645 03/01/16 0605 03/02/16 0618  WBC 8.8 7.9 8.0  NEUTROABS 5.8  --   --   HGB 6.6* 8.8* 9.2*  HCT 24.6* 30.7* 31.8*  MCV 57.2* 62.8* 62.2*  PLT 421* 356 [id number]   Basic Metabolic Panel:  Recent Labs Lab 02/29/16 1645  NA 135  K 3.4*  CL 104  CO2 25  GLUCOSE 89  BUN 11  CREATININE 0.83  CALCIUM 8.7*   GFR: Estimated Creatinine Clearance:  58.9 mL/min (by C-G formula based on SCr of 0.83 mg/dL). Liver Function Tests:  Recent Labs Lab 02/29/16 1645  AST 17  ALT 11*  ALKPHOS 73  BILITOT 0.2*  PROT 6.8  ALBUMIN 3.9   Anemia Panel:  Recent Labs  02/29/16 1645  VITAMINB12 210   Radiology Studies: Dg Chest 2 View  Result Date: 02/29/2016 CLINICAL DATA:  Dyspnea EXAM: CHEST  2 VIEW COMPARISON:  08/27/2015 chest radiograph. FINDINGS: Stable cardiomediastinal silhouette with normal heart size. No pneumothorax. No pleural effusion. Stable minimal scarring in the anterior left lung base. No pulmonary edema. No acute consolidative airspace disease. IMPRESSION: Minimal scarring at the anterior left lung base, stable. No active cardiopulmonary disease. Electronically Signed   By: Ilona Sorrel M.D.   On: 02/29/2016 17:38    Scheduled Meds: . amLODipine  10 mg Oral Daily  . aspirin  81 mg Oral Daily  . bisacodyl  10  mg Oral Once  . carvedilol  6.25 mg Oral Q breakfast  . docusate sodium  100 mg Oral BID  . irbesartan  300 mg Oral Daily  . metFORMIN  500 mg Oral Q breakfast  . pantoprazole  40 mg Oral Q0600  . polyethylene glycol-electrolytes  2,000 mL Oral Once  . rosuvastatin  20 mg Oral q1800  . sodium chloride flush  3 mL Intravenous Q12H   Continuous Infusions:    LOS: 0 days   Nakira Litzau, MD FACP Hospitalist.   If 7PM-7AM, please contact night-coverage [email or web address] Password [email or web address] 03/02/2016, 12:19 PM

## 2016-03-02 NOTE — Consult Note (Signed)
REVIEWED. AGREE. NO ADDITIONAL RECOMMENDATIONS.   Referring Provider: Dr. Marin Comment  Primary Care Physician:  Elyn Peers, MD Primary Gastroenterologist:  Dr. Collene Mares in [place] (Dr. Oneida Alar while inpatient)   Date of Admission: 02/29/16 Date of Consultation: 03/02/16  Reason for Consultation:  Anemia  HPI:  Nancy Liu is a 75 y.o. year old female who presented to the ED after she was told by her PCP that she was anemic and was told to report to the ED. States she was feeling out of breath, dyspnea on exertion, felt like someone was "squeezing her chest". Felt "wore out". Felt like she was "running a marathon" and would be out of breath after walking up a flight of stairs. No abdominal pain. No N/V. Sometimes feels like food is in a "knot" going down in her stomach. Wonders if she is eating too fast. Feels heavy in epigastric region. Tries to burp. Sometimes feels like it is going to come back up but then it goes away. At times feels like she doesn't digest her food well. Mom passed in March, so she lost about 10 lbs between December and March while taking care of her mom. She has since gotten close to her baseline weight. No bright red blood per rectum. No melena. Last few weeks has been somewhat "bound up". When she travels, she gets constipated. Has recently gone to [place], [place]. Takes Tylenol but no Ibuprofen, Advil, Aleve, etc. She does take an 81 mg aspirin. Feels better after receiving blood. Has intermittent reflux symptoms but not on anything for it right now.   Her admitting Hgb was 6.6. She received 2 units PRBCs and responded well. Hgb today is 9.2. Heme negative in ED. Colonoscopy in 2000 and 2006 both normal. EGD in 2000 and 2006 both with gastritis. She had an appointment upcoming with Dr. Collene Mares to discuss a colonoscopy in mid October, but she desires evaluation while inpatient. She is aware that Dr. Collene Mares is not a part of  our practice.   Past Medical History:  Diagnosis Date  .  Arteriosclerotic cardiovascular disease (ASCVD) 2001   Bare-metal stent placed in the right coronary artery in 12/01; residual 50% lesion of the first diagonal and mid LAD  . Cerebrovascular disease   . Diabetes mellitus    excellent control with a low-dose of a single oral agent  . History of right coronary artery stent placement 2000  . Hyperlipidemia   . Hypertension   . Tobacco abuse, in remission    35 pack years; Quit in 1980    Past Surgical History:  Procedure Laterality Date  . COLONOSCOPY  2006   Dr. Collene Mares: normal  . COLONOSCOPY  2000   Dr. Everlean Cherry: normal   . DILATION AND CURETTAGE OF UTERUS    . ESOPHAGOGASTRODUODENOSCOPY  2000   Dr. Everlean Cherry: gastritis   . ESOPHAGOGASTRODUODENOSCOPY  2006   Dr. Collene Mares: small hiatal hernia, gastritis, negative H.pylori   . INGUINAL HERNIA REPAIR  1970s   Right  . VAGINAL HYSTERECTOMY  1980   Unilateral oophorectomy    Prior to Admission medications   Medication Sig Start Date End Date Taking? Authorizing Provider  amLODipine (NORVASC) 10 MG tablet Take 1 tablet (10 mg total) by mouth daily. 01/22/16  Yes Arnoldo Lenis, MD  aspirin 81 MG tablet Take 81 mg by mouth daily.     Yes Historical Provider, MD  carvedilol (COREG) 3.125 MG tablet TAKE ONE TABLET BY MOUTH TWICE DAILY Patient taking differently: TAKE TWO  TABLETS BY MOUTH DAILY 08/31/15  Yes Arnoldo Lenis, MD  irbesartan (AVAPRO) 300 MG tablet Take 1 tablet (300 mg total) by mouth daily. 10/16/15  Yes Arnoldo Lenis, MD  metFORMIN (GLUCOPHAGE) 500 MG tablet Take 500 mg by mouth daily with breakfast.   Yes Historical Provider, MD  nitroGLYCERIN (NITROSTAT) 0.4 MG SL tablet Place 1 tablet (0.4 mg total) under the tongue every 5 (five) minutes as needed for chest pain. 02/29/16 02/28/17 Yes Lendon Colonel, NP  rosuvastatin (CRESTOR) 20 MG tablet Take 1 tablet (20 mg total) by mouth daily. 02/29/16  Yes Lendon Colonel, NP    Current Facility-Administered Medications   Medication Dose Route Frequency Provider Last Rate Last Dose  . 0.9 %  sodium chloride infusion  250 mL Intravenous PRN Orvan Falconer, MD      . acetaminophen (TYLENOL) tablet 650 mg  650 mg Oral Q6H PRN Truett Mainland, DO       Or  . acetaminophen (TYLENOL) suppository 650 mg  650 mg Rectal Q6H PRN Tanna Savoy Stinson, DO      . amLODipine (NORVASC) tablet 10 mg  10 mg Oral Daily Tanna Savoy Stinson, DO   10 mg at 03/02/16 0843  . aspirin chewable tablet 81 mg  81 mg Oral Daily Tanna Savoy Stinson, DO   81 mg at 03/01/16 1048  . carvedilol (COREG) tablet 6.25 mg  6.25 mg Oral Q breakfast Tanna Savoy Stinson, DO   6.25 mg at 03/02/16 0843  . docusate sodium (COLACE) capsule 100 mg  100 mg Oral BID Tanna Savoy Stinson, DO   100 mg at 03/02/16 [id number]  . irbesartan (AVAPRO) tablet 300 mg  300 mg Oral Daily Tanna Savoy Stinson, DO   300 mg at 03/02/16 [id number]  . metFORMIN (GLUCOPHAGE) tablet 500 mg  500 mg Oral Q breakfast Tanna Savoy Stinson, DO   500 mg at 03/02/16 0841  . ondansetron (ZOFRAN) tablet 4 mg  4 mg Oral Q6H PRN Tanna Savoy Stinson, DO       Or  . ondansetron [facility]) injection 4 mg  4 mg Intravenous Q6H PRN Tanna Savoy Stinson, DO      . pantoprazole (PROTONIX) EC tablet 40 mg  40 mg Oral Q0600 Orvan Falconer, MD   40 mg at 03/02/16 0527  . rosuvastatin (CRESTOR) tablet 20 mg  20 mg Oral q1800 Tanna Savoy Stinson, DO   20 mg at 03/01/16 1833  . sodium chloride flush (NS) 0.9 % injection 3 mL  3 mL Intravenous Q12H Orvan Falconer, MD   3 mL at 03/02/16 1000  . sodium chloride flush (NS) 0.9 % injection 3 mL  3 mL Intravenous PRN Orvan Falconer, MD        Allergies as of 02/29/2016 - Review Complete 02/29/2016  Allergen Reaction Noted  . Shellfish allergy  02/18/2011    Family History  Problem Relation Age of Onset  . Diabetes Sister   . Heart disease Mother     also hypertension and asthma  . Cancer Mother   . Colon cancer Neg Hx   . Colon polyps Neg Hx     Social History   Social History  . Marital status: Divorced    Spouse name:  N/A  . Number of children: 3  . Years of education: N/A   Occupational History  . Retired     Environmental manager work   Social History Main Topics  . Smoking status: Former Smoker  Packs/day: 1.00    Years: 18.00    Types: Cigarettes    Start date: 06/02/1965    Quit date: 06/09/1980  . Smokeless tobacco: Never Used  . Alcohol use No  . Drug use: No  . Sexual activity: Not on file   Other Topics Concern  . Not on file   Social History Narrative  . No narrative on file    Review of Systems: As mentioned in HPI   Physical Exam: Vital signs in last 24 hours: Temp:  [98.2 F (36.8 C)-98.7 F (37.1 C)] 98.7 F (37.1 C) (10/01 0539) Pulse Rate:  [72-82] 72 (10/01 0843) Resp:  [18] 18 (10/01 0539) BP: (126-147)/(67-84) 131/83 (10/01 0843) SpO2:  [99 %-100 %] 99 % (10/01 0539) Last BM Date: 02/28/16 General:   Alert,  Well-developed, well-nourished, pleasant and cooperative in NAD. Appears much younger than stated age.  Head:  Normocephalic and atraumatic. Eyes:  Sclera clear, no icterus.   Conjunctiva pink. Ears:  Normal auditory acuity. Nose:  No deformity, discharge,  or lesions. Mouth:  No deformity or lesions, dentition normal. Lungs:  Clear throughout to auscultation.   No wheezes, crackles, or rhonchi. No acute distress. Heart:  Regular rate and rhythm; no murmurs, clicks, rubs,  or gallops. Abdomen:  Soft, nontender and nondistended. No masses, hepatosplenomegaly or hernias noted. Normal bowel sounds, without guarding, and without rebound.   Rectal:  Deferred until time of colonoscopy.   Msk:  Symmetrical without gross deformities. Normal posture. Extremities:  Without edema. Neurologic:  Alert and  oriented x4;  grossly normal neurologically. Skin:  Intact without significant lesions or rashes. Psych:  Alert and cooperative. Normal mood and affect.  Intake/Output from previous day: 09/30 0701 - 10/01 0700 In: 723 [P.O.:720; I.V.:3] Out: -  Intake/Output this  shift: No intake/output data recorded.  Lab Results:  Recent Labs  02/29/16 1645 03/01/16 0605 03/02/16 0618  WBC 8.8 7.9 8.0  HGB 6.6* 8.8* 9.2*  HCT 24.6* 30.7* 31.8*  PLT 421* 356 386   BMET  Recent Labs  02/29/16 1645  NA 135  K 3.4*  CL 104  CO2 25  GLUCOSE 89  BUN 11  CREATININE 0.83  CALCIUM 8.7*   LFT  Recent Labs  02/29/16 1645  PROT 6.8  ALBUMIN 3.9  AST 17  ALT 11*  ALKPHOS 73  BILITOT 0.2*    Studies/Results: Dg Chest 2 View  Result Date: 02/29/2016 CLINICAL DATA:  Dyspnea EXAM: CHEST  2 VIEW COMPARISON:  08/27/2015 chest radiograph. FINDINGS: Stable cardiomediastinal silhouette with normal heart size. No pneumothorax. No pleural effusion. Stable minimal scarring in the anterior left lung base. No pulmonary edema. No acute consolidative airspace disease. IMPRESSION: Minimal scarring at the anterior left lung base, stable. No active cardiopulmonary disease. Electronically Signed   By: Ilona Sorrel M.D.   On: 02/29/2016 17:38    Impression: 75 year old female admitted with symptomatic anemia, requiring blood transfusion, without any overt GI bleeding and heme negative. Last colonoscopy/EGD in 2006 by Dr. Collene Mares in [place]. No family history of colorectal cancer or colon polyps, and she has no personal history of polyps. She does endorse vague upper GI symptoms to include intermittent reflux, dyspepsia, and heaviness in her upper abdomen but without true esophageal dysphagia. No concerning lower GI symptoms. Hgb has improved with 2 units PRBCs while inpatient, and she is symptomatically improved.   Would benefit from colonoscopy and EGD (due to upper GI symptoms) for further assessment. Discussed with Dr. Oneida Alar.  Will plan on colonoscopy and EGD on 10/2, utilizing Trilyte split dose prep. I discussed risks and benefits with patient today at bedside. Patient has been on clear liquids and denies any NSAIDs, and she is on no anticoagulation. Would tolerate  general anesthesia without need for Propofol.   Plan: Remain on clear liquids today. NPO after midnight  Start Trilyte prep now, split dose prep with addition of Dulcolax tablets, completing prep before midnight  Tap water enema X 2 tomorrow Colonoscopy/EGD tomorrow with Dr. Kristeen Mans, ANP-BC [facility]       LOS: 0 days    03/02/2016, 10:18 AM

## 2016-03-02 NOTE — Progress Notes (Addendum)
Late entry: Spoke with nurse regarding patient's progress with prep at 5:30pm. Patient tolerating well albeit slowly.  Advised that prep needed to be completed before midnight. Patient was almost through with first round of trilyte. Discussed timing of prep and need to complete entirety before midnight. Annitta Needs, ANP-BC [facility]

## 2016-03-03 ENCOUNTER — Ambulatory Visit (HOSPITAL_COMMUNITY): Payer: Medicare Other

## 2016-03-03 ENCOUNTER — Encounter (HOSPITAL_COMMUNITY): Payer: Self-pay | Admitting: *Deleted

## 2016-03-03 ENCOUNTER — Telehealth: Payer: Self-pay | Admitting: Gastroenterology

## 2016-03-03 ENCOUNTER — Encounter (HOSPITAL_COMMUNITY)
Admission: EM | Disposition: A | Discharge status: Home / Self Care | Payer: Self-pay | Source: Home / Self Care | Attending: Emergency Medicine

## 2016-03-03 DIAGNOSIS — K2950 Unspecified chronic gastritis without bleeding: Secondary | ICD-10-CM | POA: Diagnosis not present

## 2016-03-03 DIAGNOSIS — K259 Gastric ulcer, unspecified as acute or chronic, without hemorrhage or perforation: Secondary | ICD-10-CM | POA: Diagnosis not present

## 2016-03-03 DIAGNOSIS — K648 Other hemorrhoids: Secondary | ICD-10-CM

## 2016-03-03 DIAGNOSIS — K31819 Angiodysplasia of stomach and duodenum without bleeding: Secondary | ICD-10-CM

## 2016-03-03 DIAGNOSIS — D509 Iron deficiency anemia, unspecified: Secondary | ICD-10-CM | POA: Diagnosis not present

## 2016-03-03 DIAGNOSIS — I10 Essential (primary) hypertension: Secondary | ICD-10-CM | POA: Diagnosis not present

## 2016-03-03 DIAGNOSIS — K5730 Diverticulosis of large intestine without perforation or abscess without bleeding: Secondary | ICD-10-CM | POA: Diagnosis not present

## 2016-03-03 DIAGNOSIS — D649 Anemia, unspecified: Secondary | ICD-10-CM | POA: Diagnosis not present

## 2016-03-03 HISTORY — PX: ESOPHAGOGASTRODUODENOSCOPY: SHX5428

## 2016-03-03 HISTORY — PX: COLONOSCOPY: SHX5424

## 2016-03-03 LAB — GLUCOSE, CAPILLARY: Glucose-Capillary: 104 mg/dL — ABNORMAL HIGH (ref 65–99)

## 2016-03-03 SURGERY — COLONOSCOPY
Anesthesia: Moderate Sedation

## 2016-03-03 MED ORDER — LIDOCAINE VISCOUS 2 % MT SOLN
OROMUCOSAL | Status: AC
  Filled 2016-03-03: qty 15

## 2016-03-03 MED ORDER — PANTOPRAZOLE SODIUM 40 MG PO TBEC: 40.0000 mg | DELAYED_RELEASE_TABLET | Freq: Every day | ORAL | 1 refills | Status: DC

## 2016-03-03 MED ORDER — MIDAZOLAM HCL 5 MG/5ML IJ SOLN
INTRAMUSCULAR | Status: AC
  Filled 2016-03-03: qty 10

## 2016-03-03 MED ORDER — MIDAZOLAM HCL 5 MG/5ML IJ SOLN
INTRAMUSCULAR | Status: DC | PRN
  Administered 2016-03-03 (×2): 2 mg via INTRAVENOUS

## 2016-03-03 MED ORDER — MEPERIDINE HCL 100 MG/ML IJ SOLN
INTRAMUSCULAR | Status: DC | PRN
  Administered 2016-03-03 (×2): 25 mg via INTRAVENOUS

## 2016-03-03 MED ORDER — DOCUSATE SODIUM 100 MG PO CAPS: 100.0000 mg | ORAL_CAPSULE | Freq: Two times a day (BID) | ORAL | 0 refills | Status: DC

## 2016-03-03 MED ORDER — STERILE WATER FOR IRRIGATION IR SOLN
Status: DC | PRN
  Administered 2016-03-03: 535 mL

## 2016-03-03 MED ORDER — MEPERIDINE HCL 100 MG/ML IJ SOLN
INTRAMUSCULAR | Status: AC
  Filled 2016-03-03: qty 2

## 2016-03-03 NOTE — Care Management (Signed)
Spoke with patient about her possibly being discharged after her colonoscopy today. She is agreeable and states her daughter can give her a ride home. Spoke with hospitalist regarding this matter, he feels this is a reasonable option. Notified RN to page Dr. Marin Comment when patient is back from procedure and he will discharge patient.

## 2016-03-03 NOTE — Progress Notes (Signed)
Pt bowel movements clear, liquid. Pt finished golytely before 0000. Pt has NS at 46ml/hr infusing. Consent for colonoscopy signed.

## 2016-03-03 NOTE — Discharge Instructions (Signed)
Your LOW BLOOD COUNT IS DUE TO ULCERS/GASTRITIS FROM ASPIRIN AND SMALL BOWEL AVMs.  You have SMALL internal hemorrhoids and diverticulosis IN YOUR THROUGHOUT YOUR COLON. I biopsied your stomach.   YOU CAN CONTINUE ASPIRIN.   START PROTONIX. TAKE 30 MINUTES PRIOR TO MEALS TWICE DAILY FOR 3 MOS THEN ONCE DAILY TO PREVENT ULCERS FROM ASPIRIN USE.  YOUR BIOPSY RESULTS WILL BE AVAILABLE IN MY CHART AFTER OCT 4 AND MY OFFICE WILL CONTACT YOU IN 10-14 DAYS WITH YOUR RESULTS.   REPEAT UPPER ENDOSCOPY IN 3 MOS TO MAKE SURE YOUR ULCERS HAVE HEALED.  FOLLOW UP IN 6 MOS.    ENDOSCOPY Care After Read the instructions outlined below and refer to this sheet in the next week. These discharge instructions provide you with general information on caring for yourself after you leave the hospital. While your treatment has been planned according to the most current medical practices available, unavoidable complications occasionally occur. If you have any problems or questions after discharge, call DR. Latwan Luchsinger, [phone].  ACTIVITY  You may resume your regular activity, but move at a slower pace for the next 24 hours.   Take frequent rest periods for the next 24 hours.   Walking will help get rid of the air and reduce the bloated feeling in your belly (abdomen).   No driving for 24 hours (because of the medicine (anesthesia) used during the test).   You may shower.   Do not sign any important legal documents or operate any machinery for 24 hours (because of the anesthesia used during the test).    NUTRITION  Drink plenty of fluids.   You may resume your normal diet as instructed by your doctor.   Begin with a light meal and progress to your normal diet. Heavy or fried foods are harder to digest and may make you feel sick to your stomach (nauseated).   Avoid alcoholic beverages for 24 hours or as instructed.    MEDICATIONS  You may resume your normal medications.   WHAT YOU CAN EXPECT  TODAY  Some feelings of bloating in the abdomen.   Passage of more gas than usual.   Spotting of blood in your stool or on the toilet paper  .  IF YOU HAD POLYPS REMOVED DURING THE ENDOSCOPY:  Eat a soft diet IF YOU HAVE NAUSEA, BLOATING, ABDOMINAL PAIN, OR VOMITING.    FINDING OUT THE RESULTS OF YOUR TEST Not all test results are available during your visit. DR. Oneida Alar WILL CALL YOU WITHIN 14 DAYS OF YOUR PROCEDUE WITH YOUR RESULTS. Do not assume everything is normal if you have not heard from DR. Keila Turan, CALL HER OFFICE AT [phone].  SEEK IMMEDIATE MEDICAL ATTENTION AND CALL THE OFFICE: [phone] IF:  You have more than a spotting of blood in your stool.   Your belly is swollen (abdominal distention).   You are nauseated or vomiting.   You have a temperature over 101F.   You have abdominal pain or discomfort that is severe or gets worse throughout the day.   Gastritis  Gastritis is an inflammation (the body's way of reacting to injury and/or infection) of the stomach. It is often caused by viral or bacterial (germ) infections. It can also be caused BY ASPIRIN, BC/GOODY POWDER'S, (IBUPROFEN) MOTRIN, OR ALEVE (NAPROXEN), chemicals (including alcohol), SPICY FOODS, and medications. This illness may be associated with generalized malaise (feeling tired, not well), UPPER ABDOMINAL STOMACH cramps, and fever. One common bacterial cause of gastritis is an organism  known as H. Pylori. This can be treated with antibiotics.   Diverticulosis Diverticulosis is a common condition that develops when small pouches (diverticula) form in the wall of the colon. The risk of diverticulosis increases with age. It happens more often in people who eat a low-fiber diet. Most individuals with diverticulosis have no symptoms. Those individuals with symptoms usually experience belly (abdominal) pain, constipation, or loose stools (diarrhea).  HOME CARE INSTRUCTIONS  Increase the amount of fiber  in your diet as directed by your caregiver or dietician. This may reduce symptoms of diverticulosis.   Drink at least 6 to 8 glasses of water each day to prevent constipation.   Try not to strain when you have a bowel movement.   Avoiding nuts and seeds to prevent complications is still an uncertain benefit.   FOODS HAVING HIGH FIBER CONTENT INCLUDE:  Fruits. Apple, peach, pear, tangerine, raisins, prunes.   Vegetables. Brussels sprouts, asparagus, broccoli, cabbage, carrot, cauliflower, romaine lettuce, spinach, summer squash, tomato, winter squash, zucchini.   Starchy Vegetables. Baked beans, kidney beans, lima beans, split peas, lentils, potatoes (with skin).   Grains. Whole wheat bread, brown rice, bran flake cereal, plain oatmeal, white rice, shredded wheat, bran muffins.

## 2016-03-03 NOTE — Op Note (Signed)
[facility] Patient Name: Nancy Liu Procedure Date: 03/03/2016 3:21 PM MRN: [id number] Date of Birth: 30-Aug-1940 Attending MD: Barney Drain , MD CSN: [id number] Age: 75 Admit Type: Outpatient Procedure:                Colonoscopy, DIAGNOSTIC Indications:              Anemia-prior exam FOR ANEMIA: EGDs x2, TCS x2, LAST                            TCS in 2006. PT DENIES BRBPR OR MELENA. Providers:                Barney Drain, MD, Lurline Del, RN, Isabella Stalling,                            Technician Referring MD:             Elyn Peers MD Medicines:                Meperidine 50 mg IV, Midazolam 4 mg IV Complications:            No immediate complications. Estimated Blood Loss:     Estimated blood loss: none. Procedure:                Pre-Anesthesia Assessment:                           - Prior to the procedure, a History and Physical                            was performed, and patient medications and                            allergies were reviewed. The patient's tolerance of                            previous anesthesia was also reviewed. The risks                            and benefits of the procedure and the sedation                            options and risks were discussed with the patient.                            All questions were answered, and informed consent                            was obtained. Prior Anticoagulants: The patient has                            taken aspirin, last dose was day of procedure. ASA                            Grade Assessment: II - A patient with mild systemic  disease. After reviewing the risks and benefits,                            the patient was deemed in satisfactory condition to                            undergo the procedure. After obtaining informed                            consent, the colonoscope was passed under direct                            vision. Throughout the procedure,  the patient's                            blood pressure, pulse, and oxygen saturations were                            monitored continuously. The [id number]                            scope was introduced through the anus and advanced                            to the the cecum, identified by appendiceal orifice                            and ileocecal valve. The colonoscopy was somewhat                            difficult due to a tortuous colon. Successful                            completion of the procedure was aided by changing                            the patient to a supine position and applying                            abdominal pressure. The patient tolerated the                            procedure fairly well. The quality of the bowel                            preparation was good. The ileocecal valve,                            appendiceal orifice, and rectum were photographed. Scope In: 3:55:45 PM Scope Out: 4:30:03 PM Scope Withdrawal Time: 0 hours 29 minutes 37 seconds  Total Procedure Duration: 0 hours 34 minutes 18 seconds  Findings:      The recto-sigmoid colon and sigmoid colon were moderately redundant.      A  few small and large-mouthed diverticula were found in the sigmoid       colon and transverse colon.      The exam was otherwise without abnormality.      Non-bleeding internal hemorrhoids were found. The hemorrhoids were small. Impression:               - Redundant left colon.                           - Diverticulosis in the sigmoid colon and in the                            transverse colon.                           - The examination was otherwise normal.                           - Non-bleeding internal hemorrhoids.                           - NO SOURCE FOR PROFOUND ANEMIA IDENTIFIED Moderate Sedation:      Moderate (conscious) sedation was administered by the endoscopy nurse       and supervised by the endoscopist. The following  parameters were       monitored: oxygen saturation, heart rate, blood pressure, and response       to care. Total physician intraservice time was 68 minutes. Recommendation:           - Patient has a contact number available for                            emergencies. The signs and symptoms of potential                            delayed complications were discussed with the                            patient. Return to normal activities tomorrow.                            Written discharge instructions were provided to the                            patient.                           - High fiber diet and cardiac diet.                           - Continue present medications.                           - PROCEED WITH EGD                           - Return patient to hospital ward for ongoing care.                           -  No repeat colonoscopy due to age. Procedure Code(s):        --- Professional ---                           [phone], Colonoscopy, flexible; diagnostic, including                            collection of specimen(s) by brushing or washing,                            when performed (separate procedure)                           99152, Moderate sedation services provided by the                            same physician or other qualified health care                            professional performing the diagnostic or                            therapeutic service that the sedation supports,                            requiring the presence of an independent trained                            observer to assist in the monitoring of the                            patient's level of consciousness and physiological                            status; initial 15 minutes of intraservice time,                            patient age 30 years or older                           [phone], Moderate sedation services; each additional                            15 minutes intraservice time                            99153, Moderate sedation services; each additional                            15 minutes intraservice time                           99153, Moderate sedation services; each additional  15 minutes intraservice time                           99153, Moderate sedation services; each additional                            15 minutes intraservice time Diagnosis Code(s):        --- Professional ---                           K64.8, Other hemorrhoids                           D64.9, Anemia, unspecified                           K57.30, Diverticulosis of large intestine without                            perforation or abscess without bleeding                           Q43.8, Other specified congenital malformations of                            intestine CPT copyright 2016 [employer]. All rights reserved. The codes documented in this report are preliminary and upon coder review may  be revised to meet current compliance requirements. Barney Drain, MD Barney Drain, MD 03/03/2016 5:07:41 PM This report has been signed electronically. Number of Addenda: 0

## 2016-03-03 NOTE — Progress Notes (Signed)
Patient and family state understanding of discharge instructions, patient tolerated dinner well.

## 2016-03-03 NOTE — Discharge Summary (Signed)
Physician Discharge Summary  Nancy Liu [id number] DOB: 1940/08/21 DOA: 02/29/2016  PCP: Elyn Peers, MD  Admit date: 02/29/2016 Discharge date: 03/03/2016  Admitted From: Home Disposition:  Home.   Recommendations for Outpatient Follow-up:  1. Follow up with PCP in 1-2 weeks 2. Follow up with Dr Oneida Alar in 3 months.   Home Health: None.  Equipment/Devices: None. Discharge Condition: Improved.  CODE STATUS: FULL CODE.  Diet recommendation: As tolerated.   Brief/Interim Summary:  Patient was admitted by Dr Nehemiah Settle on Sept 29, 2017.  As per his H and P:  " Nancy Liu is a 75 y.o. female with a history of CAD with her mental stents placed in right coronary artery in 2001, diabetes on low-dose metformin, hyperlipidemia, hypertension. Patient seen for dizziness and lightheadedness and shortness of breath that started 2 months ago and has increased over the past few weeks. Shortness of breath improves with rest. Dyspneic to 15 feet. Denies fevers, chills, nausea, vomiting, melena, rectal bleeding, vaginal bleeding. Patient status post partial hysterectomy. Patient saw her primary care physician today who ordered a CBC and found her to be anemic. She was sent to the emergency department for further evaluation  HOSPITAL COURSE:  Patient was admitted for anemia, felt to be due to slow blood loss.  She was checked for her B12, due to the use of Metformin, and it was normal.  She was given 2 units of PRBCs, and she felt markedly better. Her Hb rose from 6.6 g per dL to 9.2g dL.  It was decided to keep her inpatient, and proceed with EGD and Colonoscopy.  Dr Oneida Alar performed both today, and told me that she has small non bleeding gastric ulcer and erosion.  It was biopsied and sent for H Pylori.  Her colonoscopy was negative.  She can be on ASA, with PPI, but no NSAIDS.  She can take OTC iron pills.  She will need to see Dr Oneida Alar, set up by her, for a follow up EGD to document healing.   She will avoid NSAIDS.  She will see her PCP in follow up next week.  Thank you and Good Day.   Discharge Diagnoses:  Principal Problem:   Symptomatic anemia Active Problems:   Microcytic anemia   Essential hypertension    Discharge Instructions  Discharge Instructions    Diet - low sodium heart healthy    Complete by:  As directed    Discharge instructions    Complete by:  As directed    You can take your baby aspirin, and your ulcer medication, but no NSAIDS like advil, aleve, naprosyn, etc.  See your PCP as scheduled.  See Dr Oneida Alar for follow up as scheduled by her.   Increase activity slowly    Complete by:  As directed        Medication List    STOP taking these medications   nitroGLYCERIN 0.4 MG SL tablet Commonly known as:  NITROSTAT     TAKE these medications   amLODipine 10 MG tablet Commonly known as:  NORVASC Take 1 tablet (10 mg total) by mouth daily.   aspirin 81 MG tablet Take 81 mg by mouth daily.   carvedilol 3.125 MG tablet Commonly known as:  COREG TAKE ONE TABLET BY MOUTH TWICE DAILY What changed:  See the new instructions.   docusate sodium 100 MG capsule Commonly known as:  COLACE Take 1 capsule (100 mg total) by mouth 2 (two) times daily.   irbesartan  300 MG tablet Commonly known as:  AVAPRO Take 1 tablet (300 mg total) by mouth daily.   metFORMIN 500 MG tablet Commonly known as:  GLUCOPHAGE Take 500 mg by mouth daily with breakfast.   pantoprazole 40 MG tablet Commonly known as:  PROTONIX Take 1 tablet (40 mg total) by mouth daily at 6 (six) AM. Start taking on:  03/04/2016   rosuvastatin 20 MG tablet Commonly known as:  CRESTOR Take 1 tablet (20 mg total) by mouth daily.       Allergies  Allergen Reactions  . Shellfish Allergy     Unknown reaction    Consultations:  GI Dr Oneida Alar.    Procedures/Studies: Dg Chest 2 View  Result Date: 02/29/2016 CLINICAL DATA:  Dyspnea EXAM: CHEST  2 VIEW COMPARISON:  08/27/2015  chest radiograph. FINDINGS: Stable cardiomediastinal silhouette with normal heart size. No pneumothorax. No pleural effusion. Stable minimal scarring in the anterior left lung base. No pulmonary edema. No acute consolidative airspace disease. IMPRESSION: Minimal scarring at the anterior left lung base, stable. No active cardiopulmonary disease. Electronically Signed   By: Ilona Sorrel M.D.   On: 02/29/2016 17:38       Subjective: Feeling well.    Discharge Exam: Vitals:   03/03/16 1655 03/03/16 1700  BP: 129/69 136/71  Pulse: 72 71  Resp: 19 15  Temp:     Vitals:   03/03/16 1645 03/03/16 1650 03/03/16 1655 03/03/16 1700  BP: 140/80 125/71 129/69 136/71  Pulse: 82 77 72 71  Resp: 19 (!) 21 19 15   Temp:      TempSrc:      SpO2: 100% 100% 100% 100%  Weight:      Height:        General: Pt is alert, awake, not in acute distress Cardiovascular: RRR, S1/S2 +, no rubs, no gallops Respiratory: CTA bilaterally, no wheezing, no rhonchi Abdominal: Soft, NT, ND, bowel sounds + Extremities: no edema, no cyanosis    The results of significant diagnostics from this hospitalization (including imaging, microbiology, ancillary and laboratory) are listed below for reference.    Labs: BNP (last 3 results)  Recent Labs  02/29/16 1645  BNP 0000000   Basic Metabolic Panel:  Recent Labs Lab 02/29/16 1645  NA 135  K 3.4*  CL 104  CO2 25  GLUCOSE 89  BUN 11  CREATININE 0.83  CALCIUM 8.7*   Liver Function Tests:  Recent Labs Lab 02/29/16 1645  AST 17  ALT 11*  ALKPHOS 73  BILITOT 0.2*  PROT 6.8  ALBUMIN 3.9   CBC:  Recent Labs Lab 02/29/16 1645 03/01/16 0605 03/02/16 0618  WBC 8.8 7.9 8.0  NEUTROABS 5.8  --   --   HGB 6.6* 8.8* 9.2*  HCT 24.6* 30.7* 31.8*  MCV 57.2* 62.8* 62.2*  PLT 421* 356 386   CBG:  Recent Labs Lab 03/03/16 1408  GLUCAP 104*    Time coordinating discharge: Over 30 minutes  SIGNED:   Orvan Falconer, MD FACP [facility] 03/03/2016, 5:31 PM   If 7PM-7AM, please contact night-coverage [email or web address] Password [email or web address]

## 2016-03-03 NOTE — Telephone Encounter (Signed)
REPEAT UPPER ENDOSCOPY IN 3 MOS TO MAKE SURE YOUR ULCERS HAVE HEALED.  FOLLOW UP IN 6 MOS E30 ANEMIA/AVMs/PUD.

## 2016-03-03 NOTE — Progress Notes (Signed)
PROGRESS NOTE    Nancy Liu  [id number] DOB: March 17, 1941 DOA: 02/29/2016 PCP: Nancy Peers, MD    Brief Narrative:patient is a 75 yo female with hx of DM on Metformin (metformin can cause low B12), HTN, CAD s/p stent placement, admitted for profound microcytic anemia, suspicious for slow GI bleeding. She was transfused 2 units of PRBC's bringing her H and H from 6.6 g per dL to a stable 9 g per L.  GI was consulted and planned endoscopies this admission. She was prepped.    Assessment & Plan:  Principal Problem: Symptomatic anemia Active Problems: Microcytic anemia Essential hypertension   Anemia:Supsect she has a low GI bleed. H and H have been stable. D/C iron supplement in case she would benefit getting endoscopy during this hospitalization. Hold ASA. Will obtain B12 level as she has been on Metformin and could have a combined iron and B12 deficiency.  Appreciate GI's consultation.  Plan for endoscopies this admission.   DM: Continue with current Tx.   HTN: Stable.  Subjective:  Feels well.  No complaints.   Objective: Vitals:   03/02/16 0539 03/02/16 0843 03/02/16 1344 03/02/16 2056  BP: 126/67 131/83 (!) 147/84 (!) 158/85  Pulse: 77 72 75 83  Resp: 18  18 20   Temp: 98.7 F (37.1 C)  97.7 F (36.5 C) 98.1 F (36.7 C)  TempSrc: Oral  Oral Oral  SpO2: 99%  99% 100%  Weight:      Height:        Intake/Output Summary (Last 24 hours) at 03/03/16 1241 Last data filed at 03/02/16 1952  Gross per 24 hour  Intake              574 ml  Output                0 ml  Net              574 ml   Filed Weights   02/29/16 1615  Weight: 77.6 kg (171 lb)    Examination:  General exam: Appears calm and comfortable  Respiratory system: Clear to auscultation. Respiratory effort normal. Cardiovascular system: S1 & S2 heard, RRR. No JVD, murmurs, rubs, gallops or clicks. No pedal edema. Gastrointestinal system: Abdomen is nondistended, soft and  nontender. No organomegaly or masses felt. Normal bowel sounds heard. Central nervous system: Alert and oriented. No focal neurological deficits. Extremities: Symmetric 5 x 5 power. Skin: No rashes, lesions or ulcers Psychiatry: Judgement and insight appear normal. Mood & affect appropriate.   Data Reviewed: I have personally reviewed following labs and imaging studies  CBC:  Recent Labs Lab 02/29/16 1645 03/01/16 0605 03/02/16 0618  WBC 8.8 7.9 8.0  NEUTROABS 5.8  --   --   HGB 6.6* 8.8* 9.2*  HCT 24.6* 30.7* 31.8*  MCV 57.2* 62.8* 62.2*  PLT 421* 356 [id number]   Basic Metabolic Panel:  Recent Labs Lab 02/29/16 1645  NA 135  K 3.4*  CL 104  CO2 25  GLUCOSE 89  BUN 11  CREATININE 0.83  CALCIUM 8.7*   GFR: Estimated Creatinine Clearance: 58.9 mL/min (by C-G formula based on SCr of 0.83 mg/dL). Liver Function Tests:  Recent Labs Lab 02/29/16 1645  AST 17  ALT 11*  ALKPHOS 73  BILITOT 0.2*  PROT 6.8  ALBUMIN 3.9    Recent Labs  02/29/16 1645  VITAMINB12 210   No results found for this or any previous visit (from the past 240 hour(s)).  Radiology Studies: No results found.  Scheduled Meds: . amLODipine  10 mg Oral Daily  . aspirin  81 mg Oral Daily  . bisacodyl  10 mg Oral Once  . carvedilol  6.25 mg Oral Q breakfast  . docusate sodium  100 mg Oral BID  . irbesartan  300 mg Oral Daily  . metFORMIN  500 mg Oral Q breakfast  . pantoprazole  40 mg Oral Q0600  . polyethylene glycol-electrolytes  2,000 mL Oral Once  . rosuvastatin  20 mg Oral q1800  . sodium chloride flush  3 mL Intravenous Q12H   Continuous Infusions: . sodium chloride 75 mL/hr at 03/03/16 0700     LOS: 0 days   [name], MD [facility].   If 7PM-7AM, please contact night-coverage [email or web address] Password [email or web address] 03/03/2016, 12:41 PM

## 2016-03-03 NOTE — Progress Notes (Signed)
Late entry: spoke with patient nurse this morning who stated bowel prep went well. Order entered for tap water enema at 25 and 12 today prior to procedure.  Thank you for allowing [place] to participate in the care of Nancy Bailey, DNP, AGNP-C Adult & Gerontological Nurse Practitioner [facility]

## 2016-03-03 NOTE — Op Note (Signed)
[facility] Patient Name: Nancy Liu Procedure Date: 03/03/2016 4:30 PM MRN: [id number] Date of Birth: [birth year] Attending MD: Barney Drain , MD CSN: [id number] Age: 75 Admit Type: Outpatient Procedure:                Upper GI endoscopy WITH COLD FORCEPS BIOPSY AND                            CONTROL BLEEDING Indications:              Anemia(Hb 6.6 MCV 68) Providers:                Barney Drain, MD, Lurline Del, RN, Isabella Stalling,                            Technician Referring MD:             Elyn Peers MD Medicines:                TCS + Meperidine 25 mg IV, Midazolam 1 mg IV Complications:            No immediate complications. Estimated Blood Loss:     Estimated blood loss was minimal. Procedure:                Pre-Anesthesia Assessment:                           - Prior to the procedure, a History and Physical                            was performed, and patient medications and                            allergies were reviewed. The patient's tolerance of                            previous anesthesia was also reviewed. The risks                            and benefits of the procedure and the sedation                            options and risks were discussed with the patient.                            All questions were answered, and informed consent                            was obtained. Prior Anticoagulants: The patient has                            taken aspirin, last dose was day of procedure. ASA                            Grade Assessment: II - A patient with mild systemic  disease. After reviewing the risks and benefits,                            the patient was deemed in satisfactory condition to                            undergo the procedure. After obtaining informed                            consent, the endoscope was passed under direct                            vision. Throughout the procedure, the patient's                       blood pressure, pulse, and oxygen saturations were                            monitored continuously. The [id number] was                            introduced through the mouth, and advanced to the                            second part of duodenum. The upper GI endoscopy was                            accomplished without difficulty. The patient                            tolerated the procedure well. Scope In: 4:35:56 PM Scope Out: 4:49:08 PM Total Procedure Duration: 0 hours 13 minutes 12 seconds  Findings:      The examined esophagus was normal.      Many non-bleeding cratered gastric ulcers with no stigmata of bleeding       were found in the gastric antrum. Biopsies were taken with a cold       forceps for Helicobacter pylori testing.      Scattered moderate inflammation characterized by erosions was found in       the gastric antrum. Biopsies were taken with a cold forceps for       Helicobacter pylori testing.      Scattered mild inflammation characterized by congestion (edema) and       erythema was found in the duodenal bulb.      A single 4 mm angiodysplastic lesion without bleeding was found in the       second portion of the duodenum. Coagulation for bleeding prevention       using bipolar probe was successful. Impression:               - ANEMIA DUE TO gastric ulcers AND DUODENAL AVM                           - MODERATE Gastritis. Biopsied.                           -  MILD Duodenitis. Moderate Sedation:      Moderate (conscious) sedation was administered by the endoscopy nurse       and supervised by the endoscopist. The following parameters were       monitored: oxygen saturation, heart rate, blood pressure, and response       to care. Total physician intraservice time was 68 minutes. Recommendation:           - High fiber diet and cardiac diet.                           - Continue present medications.                           - Await  pathology results.                           - Repeat upper endoscopy in 3 months for                            surveillance.                           - Return to GI office in 6 months.                           - Return patient to hospital ward. Procedure Code(s):        --- Professional ---                           [phone], 58, Esophagogastroduodenoscopy, flexible,                            transoral; with control of bleeding, any method                           43239, Esophagogastroduodenoscopy, flexible,                            transoral; with biopsy, single or multiple                           99152, Moderate sedation services provided by the                            same physician or other qualified health care                            professional performing the diagnostic or                            therapeutic service that the sedation supports,                            requiring the presence of an independent trained  observer to assist in the monitoring of the                            patient's level of consciousness and physiological                            status; initial 15 minutes of intraservice time,                            patient age 34 years or older                           [phone], Moderate sedation services; each additional                            15 minutes intraservice time                           99153, Moderate sedation services; each additional                            15 minutes intraservice time                           99153, Moderate sedation services; each additional                            15 minutes intraservice time                           99153, Moderate sedation services; each additional                            15 minutes intraservice time Diagnosis Code(s):        --- Professional ---                           K25.9, Gastric ulcer, unspecified as acute or                             chronic, without hemorrhage or perforation                           K29.70, Gastritis, unspecified, without bleeding                           K29.80, Duodenitis without bleeding                           K31.819, Angiodysplasia of stomach and duodenum                            without bleeding                           D64.9, Anemia, unspecified CPT copyright 2016 American Medical Association. All rights reserved. The codes  documented in this report are preliminary and upon coder review may  be revised to meet current compliance requirements. Barney Drain, MD Barney Drain, MD 03/03/2016 5:13:38 PM This report has been signed electronically. Number of Addenda: 0

## 2016-03-03 NOTE — H&P (Signed)
Primary Care Physician:  Elyn Peers, MD Primary Gastroenterologist:  Dr. Oneida Alar  Pre-Procedure History & Physical: HPI:  Nancy Liu is a 75 y.o. female here for Anemia-MICROCYTIC.  Past Medical History:  Diagnosis Date  . Arteriosclerotic cardiovascular disease (ASCVD) 2001   Bare-metal stent placed in the right coronary artery in 12/01; residual 50% lesion of the first diagonal and mid LAD  . Cerebrovascular disease   . Diabetes mellitus    excellent control with a low-dose of a single oral agent  . History of right coronary artery stent placement 2000  . Hyperlipidemia   . Hypertension   . Tobacco abuse, in remission    35 pack years; Quit in 1980    Past Surgical History:  Procedure Laterality Date  . COLONOSCOPY  2006   Dr. Collene Mares: normal  . COLONOSCOPY  2000   Dr. Everlean Cherry: normal   . DILATION AND CURETTAGE OF UTERUS    . ESOPHAGOGASTRODUODENOSCOPY  2000   Dr. Everlean Cherry: gastritis   . ESOPHAGOGASTRODUODENOSCOPY  2006   Dr. Collene Mares: small hiatal hernia, gastritis, negative H.pylori   . INGUINAL HERNIA REPAIR  1970s   Right  . VAGINAL HYSTERECTOMY  1980   Unilateral oophorectomy    Prior to Admission medications   Medication Sig Start Date End Date Taking? Authorizing Provider  amLODipine (NORVASC) 10 MG tablet Take 1 tablet (10 mg total) by mouth daily. 01/22/16  Yes Arnoldo Lenis, MD  aspirin 81 MG tablet Take 81 mg by mouth daily.     Yes Historical Provider, MD  carvedilol (COREG) 3.125 MG tablet TAKE ONE TABLET BY MOUTH TWICE DAILY Patient taking differently: TAKE TWO TABLETS BY MOUTH DAILY 08/31/15  Yes Arnoldo Lenis, MD  irbesartan (AVAPRO) 300 MG tablet Take 1 tablet (300 mg total) by mouth daily. 10/16/15  Yes Arnoldo Lenis, MD  metFORMIN (GLUCOPHAGE) 500 MG tablet Take 500 mg by mouth daily with breakfast.   Yes Historical Provider, MD  nitroGLYCERIN (NITROSTAT) 0.4 MG SL tablet Place 1 tablet (0.4 mg total) under the tongue every 5 (five) minutes as  needed for chest pain. 02/29/16 02/28/17 Yes Lendon Colonel, NP  rosuvastatin (CRESTOR) 20 MG tablet Take 1 tablet (20 mg total) by mouth daily. 02/29/16  Yes Lendon Colonel, NP    Allergies as of 02/29/2016 - Review Complete 02/29/2016  Allergen Reaction Noted  . Shellfish allergy  02/18/2011    Family History  Problem Relation Age of Onset  . Diabetes Sister   . Heart disease Mother     also hypertension and asthma  . Cancer Mother   . Colon cancer Neg Hx   . Colon polyps Neg Hx     Social History   Social History  . Marital status: Divorced    Spouse name: N/A  . Number of children: 3  . Years of education: N/A   Occupational History  . Retired     Environmental manager work   Social History Main Topics  . Smoking status: Former Smoker    Packs/day: 1.00    Years: 18.00    Types: Cigarettes    Start date: 06/02/1965    Quit date: 06/09/1980  . Smokeless tobacco: Never Used  . Alcohol use No  . Drug use: No  . Sexual activity: Not on file   Other Topics Concern  . Not on file   Social History Narrative  . No narrative on file    Review of Systems: See HPI, otherwise negative  ROS   Physical Exam: BP (!) 154/79   Pulse 72   Temp 98.8 F (37.1 C) (Oral)   Resp 16   Ht 5' 2.5" (1.588 m)   Wt 171 lb (77.6 kg)   SpO2 100%   BMI 30.78 kg/m  General:   Alert,  pleasant and cooperative in NAD Head:  Normocephalic and atraumatic. Neck:  Supple; Lungs:  Clear throughout to auscultation.    Heart:  Regular rate and rhythm. Abdomen:  Soft, nontender and nondistended. Normal bowel sounds, without guarding, and without rebound.   Neurologic:  Alert and  oriented x4;  grossly normal neurologically.  Impression/Plan:     Anemia-MICROCYTIC  PLAN:  1. TCS/?EGD TODAY

## 2016-03-04 ENCOUNTER — Encounter (HOSPITAL_COMMUNITY): Payer: Medicare Other

## 2016-03-04 ENCOUNTER — Encounter (HOSPITAL_COMMUNITY): Admission: RE | Admit: 2016-03-04 | Payer: Medicare Other | Source: Ambulatory Visit

## 2016-03-04 ENCOUNTER — Inpatient Hospital Stay (HOSPITAL_COMMUNITY): Admission: RE | Admit: 2016-03-04 | Payer: Medicare Other | Source: Ambulatory Visit

## 2016-03-04 ENCOUNTER — Other Ambulatory Visit (HOSPITAL_COMMUNITY): Payer: Medicare Other

## 2016-03-04 NOTE — Telephone Encounter (Signed)
PATIENT ON RECALL  °

## 2016-03-04 NOTE — Telephone Encounter (Signed)
Placed in bin over printer.

## 2016-03-06 ENCOUNTER — Other Ambulatory Visit: Payer: Self-pay | Admitting: Cardiology

## 2016-03-06 MED ORDER — ROSUVASTATIN CALCIUM 20 MG PO TABS: 20.0000 mg | ORAL_TABLET | Freq: Every day | ORAL | 3 refills | Status: DC

## 2016-03-06 NOTE — Telephone Encounter (Signed)
Refill re-sent  

## 2016-03-06 NOTE — Telephone Encounter (Signed)
[facility] states they did not receive RX for Rosuvastatin. Will you please resend./ tg

## 2016-03-07 ENCOUNTER — Encounter (HOSPITAL_COMMUNITY): Payer: Self-pay | Admitting: Gastroenterology

## 2016-03-10 ENCOUNTER — Ambulatory Visit (HOSPITAL_COMMUNITY)
Admission: RE | Admit: 2016-03-10 | Discharge: 2016-03-10 | Disposition: A | Discharge status: Home / Self Care | Payer: Medicare Other | Source: Ambulatory Visit | Attending: Family Medicine | Admitting: Family Medicine

## 2016-03-10 DIAGNOSIS — Z1231 Encounter for screening mammogram for malignant neoplasm of breast: Secondary | ICD-10-CM | POA: Diagnosis not present

## 2016-03-14 ENCOUNTER — Encounter (HOSPITAL_COMMUNITY)
Admission: RE | Admit: 2016-03-14 | Discharge: 2016-03-14 | Disposition: A | Discharge status: Home / Self Care | Payer: Medicare Other | Source: Ambulatory Visit | Attending: Adult Health | Admitting: Adult Health

## 2016-03-14 ENCOUNTER — Ambulatory Visit (HOSPITAL_COMMUNITY)
Admission: RE | Admit: 2016-03-14 | Discharge: 2016-03-14 | Disposition: A | Discharge status: Home / Self Care | Payer: Medicare Other | Source: Ambulatory Visit | Attending: Adult Health | Admitting: Adult Health

## 2016-03-14 ENCOUNTER — Encounter (HOSPITAL_COMMUNITY): Payer: Medicare Other

## 2016-03-14 ENCOUNTER — Encounter (HOSPITAL_COMMUNITY): Payer: Self-pay

## 2016-03-14 DIAGNOSIS — R0602 Shortness of breath: Secondary | ICD-10-CM | POA: Diagnosis not present

## 2016-03-14 DIAGNOSIS — R0789 Other chest pain: Secondary | ICD-10-CM | POA: Insufficient documentation

## 2016-03-14 DIAGNOSIS — I501 Left ventricular failure: Secondary | ICD-10-CM | POA: Diagnosis not present

## 2016-03-14 LAB — NM MYOCAR MULTI W/SPECT W/WALL MOTION / EF
CHL CUP NUCLEAR SDS: 4
CHL CUP RESTING HR STRESS: 71 {beats}/min
LHR: 0.05
LV dias vol: 53 mL (ref 46–106)
LVSYSVOL: 28 mL
NUC STRESS TID: 0.81
Peak HR: 94 {beats}/min
SRS: 4
SSS: 8

## 2016-03-14 MED ORDER — TECHNETIUM TC 99M TETROFOSMIN IV KIT
30.0000 | PACK | Freq: Once | INTRAVENOUS | Status: AC | PRN
  Administered 2016-03-14: 30 via INTRAVENOUS

## 2016-03-14 MED ORDER — REGADENOSON 0.4 MG/5ML IV SOLN
INTRAVENOUS | Status: AC
  Administered 2016-03-14: 0.4 mg via INTRAVENOUS
  Filled 2016-03-14: qty 5

## 2016-03-14 MED ORDER — SODIUM CHLORIDE 0.9% FLUSH
INTRAVENOUS | Status: AC
  Administered 2016-03-14: 10 mL via INTRAVENOUS
  Filled 2016-03-14: qty 10

## 2016-03-14 MED ORDER — TECHNETIUM TC 99M TETROFOSMIN IV KIT
10.0000 | PACK | Freq: Once | INTRAVENOUS | Status: AC | PRN
  Administered 2016-03-14: 10.3 via INTRAVENOUS

## 2016-03-14 NOTE — Progress Notes (Signed)
*  PRELIMINARY RESULTS* Echocardiogram 2D Echocardiogram has been performed.  Leavy Cella 03/14/2016, 8:54 AM

## 2016-03-24 DIAGNOSIS — H401131 Primary open-angle glaucoma, bilateral, mild stage: Secondary | ICD-10-CM | POA: Diagnosis not present

## 2016-03-24 DIAGNOSIS — H2513 Age-related nuclear cataract, bilateral: Secondary | ICD-10-CM | POA: Diagnosis not present

## 2016-03-26 ENCOUNTER — Telehealth: Payer: Self-pay | Admitting: Gastroenterology

## 2016-03-26 NOTE — Telephone Encounter (Signed)
[phone]  Please call patient about medications she was given after her procedures

## 2016-03-26 NOTE — Telephone Encounter (Signed)
Pt said she was given Protonix 40 mg after her procedures. She was told to take it after meals, but the bottle said once a day and that is the way that she took it.  She started having very dry lips and saw that was a side effect of it.  She spoke to the pharmacist and was told she could take Lansoprazole 15 mg SR capsule daily.  She has been doing that and has been doing well.  She also is interested in her results.

## 2016-03-27 NOTE — Telephone Encounter (Signed)
PLEASE CALL PT. HER BIOPSIES SHOW GASTRITIS/ULCERS FROM ASA USE. SHE WAS ASKED TO START PROTONIX 30 MINS PRIOR TO MEALS TWICE DAILY FOR 3 MOS THEN ONCE DAILY TO PREVENT ULCERS FROM ASPIRIN USE.  LANSOPRAZOLE IS AN ACCEPTABLE ALTERNATIVE BUT SHE SHOULD TAKE 2 DAILY.   REPEAT UPPER ENDOSCOPY IN JAN 2018 TO MAKE SURE HER ULCERS HAVE HEALED. SHE NEEDS A CBC AND FERRITIN IN THE PREOP AREA ON THE DAY OF HER EGD.  FOLLOW UP IN 6 MOS E30 PUD, ANEMIA.

## 2016-03-27 NOTE — Telephone Encounter (Signed)
NIC for EGD in Jan

## 2016-03-27 NOTE — Telephone Encounter (Signed)
Reminder in epic °

## 2016-03-27 NOTE — Telephone Encounter (Signed)
LMOM to call.

## 2016-03-27 NOTE — Addendum Note (Signed)
Addended by: Danie Binder on: 03/27/2016 01:24 PM   Modules accepted: Orders

## 2016-03-28 NOTE — Telephone Encounter (Signed)
REVIEWED-NO ADDITIONAL RECOMMENDATIONS. 

## 2016-03-28 NOTE — Telephone Encounter (Signed)
Called and informed pt. She said her stools have been dark. Not black and tarry and sticky. I told her if they get black, tarry and sticky to let us know and she said she would.

## 2016-03-31 DIAGNOSIS — Z79899 Other long term (current) drug therapy: Secondary | ICD-10-CM | POA: Diagnosis not present

## 2016-03-31 DIAGNOSIS — E782 Mixed hyperlipidemia: Secondary | ICD-10-CM | POA: Diagnosis not present

## 2016-03-31 DIAGNOSIS — D649 Anemia, unspecified: Secondary | ICD-10-CM | POA: Diagnosis not present

## 2016-03-31 DIAGNOSIS — I119 Hypertensive heart disease without heart failure: Secondary | ICD-10-CM | POA: Diagnosis not present

## 2016-04-03 NOTE — Progress Notes (Signed)
Cardiology Office Note    Date:  04/04/2016   ID:  [name], DOB 09-23-1940, MRN [id number]  PCP:  Elyn Peers, MD  Cardiologist: Dr. Harl Bowie   Chief Complaint  Patient presents with  . Follow-up    History of Present Illness:  Nancy Liu is a 75 y.o. female with history of CAD status post BMS to the RCA in 2001, no ischemia on nuclear stress test 2012, hypertension, hyperlipidemia, palpitations, shortness of breath with abnormal PFTs was severely reduced DLCO followed by Dr. Luan Pulling.   Was seen for chest pain and dyspnea on exertion 02/2016 and Lexi scan Myoview was a low risk study with no ischemia or infarction EF 47% however visually normal and echo the same day showed EF of 55-60%.  Patient has since been admitted with anemia requiring transfusions. Endoscopy revealed small nonbleeding gastric ulcer and erosion and colonoscopy was negative. She had an allergic reaction to Protonix and now is doing much better on Prevacid. She denies any further chest pain or dyspnea on exertion and is feeling so much better.    Past Medical History:  Diagnosis Date  . Arteriosclerotic cardiovascular disease (ASCVD) 2001   Bare-metal stent placed in the right coronary artery in 12/01; residual 50% lesion of the first diagonal and mid LAD  . Cerebrovascular disease   . Diabetes mellitus    excellent control with a low-dose of a single oral agent  . History of right coronary artery stent placement 2000  . Hyperlipidemia   . Hypertension   . Tobacco abuse, in remission    35 pack years; Quit in 1980    Past Surgical History:  Procedure Laterality Date  . COLONOSCOPY  2006   Dr. Collene Mares: normal  . COLONOSCOPY  2000   Dr. Everlean Cherry: normal   . COLONOSCOPY N/A 03/03/2016   Procedure: COLONOSCOPY;  Surgeon: Danie Binder, MD;  [facility]: [facility];  Service: Endoscopy;  Laterality: N/A;  . DILATION AND CURETTAGE OF UTERUS    . ESOPHAGOGASTRODUODENOSCOPY  2000   Dr. Everlean Cherry:  gastritis   . ESOPHAGOGASTRODUODENOSCOPY  2006   Dr. Collene Mares: small hiatal hernia, gastritis, negative H.pylori   . ESOPHAGOGASTRODUODENOSCOPY N/A 03/03/2016   Procedure: ESOPHAGOGASTRODUODENOSCOPY (EGD);  Surgeon: Danie Binder, MD;  Location: [facility];  Service: Endoscopy;  Laterality: N/A;  . INGUINAL HERNIA REPAIR  1970s   Right  . VAGINAL HYSTERECTOMY  1980   Unilateral oophorectomy    Current Medications: Outpatient Medications Prior to Visit  Medication Sig Dispense Refill  . amLODipine (NORVASC) 10 MG tablet Take 1 tablet (10 mg total) by mouth daily. 90 tablet 2  . aspirin 81 MG tablet Take 81 mg by mouth daily.      . carvedilol (COREG) 3.125 MG tablet TAKE ONE TABLET BY MOUTH TWICE DAILY (Patient taking differently: TAKE TWO TABLETS BY MOUTH DAILY) 60 tablet 6  . docusate sodium (COLACE) 100 MG capsule Take 1 capsule (100 mg total) by mouth 2 (two) times daily. 10 capsule 0  . irbesartan (AVAPRO) 300 MG tablet Take 1 tablet (300 mg total) by mouth daily. 90 tablet 3  . metFORMIN (GLUCOPHAGE) 500 MG tablet Take 500 mg by mouth daily with breakfast.    . rosuvastatin (CRESTOR) 20 MG tablet Take 1 tablet (20 mg total) by mouth daily. 30 tablet 3   No facility-administered medications prior to visit.      Allergies:   Protonix [pantoprazole sodium] and Shellfish allergy   Social  History   Social History  . Marital status: Divorced    Spouse name: N/A  . Number of children: 3  . Years of education: N/A   Occupational History  . Retired     Environmental manager work   Social History Main Topics  . Smoking status: Former Smoker    Packs/day: 1.00    Years: 18.00    Types: Cigarettes    Start date: 06/02/1965    Quit date: 06/09/1980  . Smokeless tobacco: Never Used  . Alcohol use No  . Drug use: No  . Sexual activity: No   Other Topics Concern  . None   Social History Narrative  . None     Family History:  The patient's family history includes Cancer in her mother;  Diabetes in her sister; Heart disease in her mother.   ROS:   Please see the history of present illness.    Review of Systems  Constitution: Negative.  HENT: Negative.   Eyes: Negative.   Cardiovascular: Negative.   Respiratory: Negative.   Hematologic/Lymphatic: Negative.   Musculoskeletal: Negative.  Negative for joint pain.  Gastrointestinal: Negative.   Genitourinary: Negative.   Neurological: Negative.    All other systems reviewed and are negative.   PHYSICAL EXAM:   VS:  BP 122/72   Pulse 91   Ht 5' 2.5" (1.588 m)   Wt 171 lb (77.6 kg)   SpO2 98%   BMI 30.78 kg/m   Physical Exam  GEN: Well nourished, well developed, in no acute distress Neck: no JVD, carotid bruits, or masses Cardiac:RRR; no murmurs, rubs, or gallops  Respiratory:  clear to auscultation bilaterally, normal work of breathing GI: soft, nontender, nondistended, + BS Ext: without cyanosis, clubbing, or edema, Good distal pulses bilaterally MS: no deformity or atrophy Skin: warm and dry, no rash Psych: euthymic mood, full affect  Wt Readings from Last 3 Encounters:  04/04/16 171 lb (77.6 kg)  02/29/16 171 lb (77.6 kg)  02/28/16 171 lb (77.6 kg)      Studies/Labs Reviewed:   EKG:  EKG is not ordered today.    Recent Labs: 02/29/2016: ALT 11; B Natriuretic Peptide 57.0; BUN 11; Creatinine, Ser 0.83; Potassium 3.4; Sodium 135 03/02/2016: Hemoglobin 9.2; Platelets 386   Lipid Panel    Component Value Date/Time   CHOL 134 08/01/2010 1731   TRIG 132 08/01/2010 1731   HDL 40 08/01/2010 1731   CHOLHDL 3.4 Ratio 08/01/2010 1731   VLDL 26 08/01/2010 1731   LDLCALC 68 08/01/2010 1731   LDLCALC 81 01/21/2010    Additional studies/ records that were reviewed today include:  2-D echo 03/14/16 Study Conclusions   - Left ventricle: The cavity size was normal. Wall thickness was   increased in a pattern of moderate LVH. Systolic function was   normal. The estimated ejection fraction was in the  range of 55%   to 60%. Wall motion was normal; there were no regional wall   motion abnormalities. Doppler parameters are consistent with   abnormal left ventricular relaxation (grade 1 diastolic   dysfunction).  Lexi scan Myoview 03/14/16  This is a low risk study.  The left ventricular ejection fraction is mildly decreased (45-54%).  There was no ST segment deviation noted during stress.   Normal resting and stress perfusion. No ischemia or infarction EF 47% however visually normal and echo from same day showed normal EF 55-60%       ASSESSMENT:    1. Chest discomfort  2. Coronary artery disease involving native coronary artery of native heart without angina pectoris   3. Essential hypertension   4. Hyperlipidemia, unspecified hyperlipidemia type      PLAN:  In order of problems listed above:  Chest discomfort with normal nuclear stress test. Most likely secondary to anemia and ulcers treated with transfusions and Prevacid. No further chest pain. Follow-up with Dr. Harl Bowie in 6 months.  CAD with normal nuclear stress test and normal LVEF on 2-D echo  Hypertension well controlled on carvedilol and Avapro  HLD on Crestor.     Medication Adjustments/Labs and Tests Ordered: Current medicines are reviewed at length with the patient today.  Concerns regarding medicines are outlined above.  Medication changes, Labs and Tests ordered today are listed in the Patient Instructions below. Patient Instructions  Your physician wants you to follow-up in: 6 months Dr Bryna Colander will receive a reminder letter in the mail two months in advance. If you don't receive a letter, please call our office to schedule the follow-up appointment.    Your physician recommends that you continue on your current medications as directed. Please refer to the Current Medication list given to you today.     Thank you for choosing [facility] !            Sumner Boast, PA-C  04/04/2016 1:03 PM    [facility] [place], [place], [place]  [address] Phone: [phone]; Fax: [phone]

## 2016-04-04 ENCOUNTER — Ambulatory Visit (INDEPENDENT_AMBULATORY_CARE_PROVIDER_SITE_OTHER): Payer: Medicare Other | Admitting: Physician Assistant

## 2016-04-04 ENCOUNTER — Encounter: Payer: Self-pay | Admitting: Physician Assistant

## 2016-04-04 VITALS — BP 122/72 | HR 91 | Ht 62.5 in | Wt 171.0 lb

## 2016-04-04 DIAGNOSIS — R0789 Other chest pain: Secondary | ICD-10-CM | POA: Diagnosis not present

## 2016-04-04 DIAGNOSIS — E785 Hyperlipidemia, unspecified: Secondary | ICD-10-CM | POA: Diagnosis not present

## 2016-04-04 DIAGNOSIS — I2510 Atherosclerotic heart disease of native coronary artery without angina pectoris: Secondary | ICD-10-CM

## 2016-04-04 DIAGNOSIS — I10 Essential (primary) hypertension: Secondary | ICD-10-CM

## 2016-04-04 NOTE — Patient Instructions (Signed)
Your physician wants you to follow-up in: 6 months Dr Branch You will receive a reminder letter in the mail two months in advance. If you don't receive a letter, please call our office to schedule the follow-up appointment.    Your physician recommends that you continue on your current medications as directed. Please refer to the Current Medication list given to you today.      Thank you for choosing [facility] !         

## 2016-05-06 ENCOUNTER — Telehealth: Payer: Self-pay | Admitting: Gastroenterology

## 2016-05-06 DIAGNOSIS — D649 Anemia, unspecified: Secondary | ICD-10-CM | POA: Diagnosis not present

## 2016-05-06 DIAGNOSIS — E119 Type 2 diabetes mellitus without complications: Secondary | ICD-10-CM | POA: Diagnosis not present

## 2016-05-06 DIAGNOSIS — E782 Mixed hyperlipidemia: Secondary | ICD-10-CM | POA: Diagnosis not present

## 2016-05-06 DIAGNOSIS — I119 Hypertensive heart disease without heart failure: Secondary | ICD-10-CM | POA: Diagnosis not present

## 2016-05-06 NOTE — Telephone Encounter (Signed)
Jan recall for repeat endo

## 2016-05-06 NOTE — Telephone Encounter (Signed)
Will need update information before we schedule

## 2016-05-12 NOTE — Telephone Encounter (Signed)
PT said she is also taking Lansoprazole 15 mg daily.

## 2016-05-12 NOTE — Telephone Encounter (Signed)
Gastroenterology Pre-Procedure Review  Request Date: 05/12/2016 Requesting Physician: Dr. Oneida Alar  PATIENT REVIEW QUESTIONS: The patient responded to the following health history questions as indicated:    1. Diabetes Melitis: YES 2. Joint replacements in the past 12 months: no 3. Major health problems in the past 3 months: no 4. Has an artificial valve or MVP: no 5. Has a defibrillator: no 6. Has been advised in past to take antibiotics in advance of a procedure like teeth cleaning: no 7. Family history of colon cancer: no  8. Alcohol Use: no 9. History of sleep apnea: no  10. History of coronary artery or other vascular stents placed within the last 12 months: no    MEDICATIONS & ALLERGIES:    Patient reports the following regarding taking any blood thinners:   Plavix? no  Aspirin? YES Coumadin? no Brilinta? no Xarelto? no Eliquis? no Pradaxa? no Savaysa? no Effient? no  Patient confirms/reports the following medications:  Current Outpatient Prescriptions  Medication Sig Dispense Refill  . amLODipine (NORVASC) 10 MG tablet Take 1 tablet (10 mg total) by mouth daily. 90 tablet 2  . aspirin 81 MG tablet Take 81 mg by mouth daily.      . carvedilol (COREG) 3.125 MG tablet TAKE ONE TABLET BY MOUTH TWICE DAILY (Patient taking differently: TAKE TWO TABLETS BY MOUTH DAILY) 60 tablet 6  . irbesartan (AVAPRO) 300 MG tablet Take 1 tablet (300 mg total) by mouth daily. 90 tablet 3  . metFORMIN (GLUCOPHAGE) 500 MG tablet Take 500 mg by mouth daily with breakfast.    . rosuvastatin (CRESTOR) 20 MG tablet Take 1 tablet (20 mg total) by mouth daily. 30 tablet 3  . docusate sodium (COLACE) 100 MG capsule Take 1 capsule (100 mg total) by mouth 2 (two) times daily. (Patient not taking: Reported on 05/12/2016) 10 capsule 0   No current facility-administered medications for this visit.     Patient confirms/reports the following allergies:  Allergies  Allergen Reactions  . Protonix  [Pantoprazole Sodium]     DRY LIPS  . Shellfish Allergy     Unknown reaction    No orders of the defined types were placed in this encounter.   AUTHORIZATION INFORMATION Primary Insurance:   ID #:  Group #:  Pre-Cert / Auth required:  Pre-Cert / Auth #:   Secondary Insurance:   ID #:  Group #:  Pre-Cert / Auth required:  Pre-Cert / Auth #:   SCHEDULE INFORMATION: Procedure has been scheduled as follows:  Date:              Time:   Location:   This Gastroenterology Pre-Precedure Review Form is being routed to the following provider(s): Barney Drain, MD

## 2016-05-13 ENCOUNTER — Other Ambulatory Visit: Payer: Self-pay

## 2016-05-13 DIAGNOSIS — K2990 Gastroduodenitis, unspecified, without bleeding: Principal | ICD-10-CM

## 2016-05-13 DIAGNOSIS — K2970 Gastritis, unspecified, without bleeding: Secondary | ICD-10-CM

## 2016-05-13 NOTE — Telephone Encounter (Signed)
Pt is set up for EGD on 06/03/16. She is aware and instructions are in the mail.

## 2016-05-13 NOTE — Telephone Encounter (Signed)
Forwarding to Ginger to complete the appt.

## 2016-05-13 NOTE — Telephone Encounter (Signed)
SUPREP SPLIT DOSING- FULL LIQUIDS WITH BREAKFAST.  Full Liquid Diet A high-calorie, high-protein supplement should be used to meet your nutritional requirements when the full liquid diet is continued for more than 2 or 3 days. If this diet is to be used for an extended period of time (more than 7 days), a multivitamin should be considered.  Breads and Starches  Allowed: None are allowed   Avoid: Any others.    Potatoes/Pasta/Rice  Allowed: ANY ITEM AS A SOUP OR SMALL PLATE OF MASHED POTATOES OR SCRAMBLED EGGS. (DO NOT EAT MORE THAN ONE SERVING ON THE DAY BEFORE COLONOSCOPY).      Vegetables  Allowed: Strained tomato or vegetable juice. Vegetables pureed in soup.   Avoid: Any others.    Fruit  Allowed: Any strained fruit juices and fruit drinks. Include 1 serving of citrus or vitamin C-enriched fruit juice daily.   Avoid: Any others.  Meat and Meat Substitutes  Allowed: Egg  Avoid: Any meat, fish, or fowl. All cheese.  Milk  Allowed: SOY Milk beverages, including milk shakes and instant breakfast mixes. Smooth yogurt.   Avoid: Any others. Avoid dairy products if not tolerated.    Soups and Combination Foods  Allowed: Broth, strained cream soups. Strained, broth-based soups.   Avoid: Any others.    Desserts and Sweets  Allowed: flavored gelatin, tapioca, ice cream, sherbet, smooth pudding, junket, fruit ices, frozen ice pops, pudding pops, frozen fudge pops, chocolate syrup. Sugar, honey, jelly, syrup.   Avoid: Any others.  Fats and Oils  Allowed: Margarine, butter, cream, sour cream, oils.   Avoid: Any others.  Beverages  Allowed: All.   Avoid: None.  Condiments  Allowed: Iodized salt, pepper, spices, flavorings. Cocoa powder.   Avoid: Any others.    SAMPLE MEAL PLAN Breakfast   cup orange juice.   1 OR 2 EGGS  1 cup milk.   1 cup beverage (coffee or tea).   Cream or sugar, if desired.    Midmorning Snack  2 SCRAMBLED OR HARD  BOILED EGG   Lunch  1 cup cream soup.    cup fruit juice.   1 cup milk.    cup custard.   1 cup beverage (coffee or tea).   Cream or sugar, if desired.    Midafternoon Snack  1 cup milk shake.  Dinner  1 cup cream soup.    cup fruit juice.   1 cup MILK    cup pudding.   1 cup beverage (coffee or tea).   Cream or sugar, if desired.  Evening Snack  1 cup supplement.  To increase calories, add sugar, cream, butter, or margarine if possible. Nutritional supplements will also increase the total calories.  

## 2016-05-19 ENCOUNTER — Telehealth: Payer: Self-pay

## 2016-05-19 NOTE — Telephone Encounter (Signed)
Called and informed pt of time change for EGD 06/03/16. New procedure time 2:15 pm, arrive at 1:15 pm.

## 2016-06-03 ENCOUNTER — Encounter (HOSPITAL_COMMUNITY)
Admission: RE | Disposition: A | Discharge status: Home / Self Care | Payer: Self-pay | Source: Ambulatory Visit | Attending: Gastroenterology

## 2016-06-03 ENCOUNTER — Encounter (HOSPITAL_COMMUNITY): Payer: Self-pay | Admitting: *Deleted

## 2016-06-03 ENCOUNTER — Ambulatory Visit (HOSPITAL_COMMUNITY)
Admission: RE | Admit: 2016-06-03 | Discharge: 2016-06-03 | Disposition: A | Discharge status: Home / Self Care | Payer: Medicare Other | Source: Ambulatory Visit | Attending: Gastroenterology | Admitting: Gastroenterology

## 2016-06-03 DIAGNOSIS — Z90710 Acquired absence of both cervix and uterus: Secondary | ICD-10-CM | POA: Insufficient documentation

## 2016-06-03 DIAGNOSIS — K219 Gastro-esophageal reflux disease without esophagitis: Secondary | ICD-10-CM | POA: Diagnosis not present

## 2016-06-03 DIAGNOSIS — Z8711 Personal history of peptic ulcer disease: Secondary | ICD-10-CM | POA: Insufficient documentation

## 2016-06-03 DIAGNOSIS — K2960 Other gastritis without bleeding: Secondary | ICD-10-CM | POA: Diagnosis not present

## 2016-06-03 DIAGNOSIS — I2510 Atherosclerotic heart disease of native coronary artery without angina pectoris: Secondary | ICD-10-CM | POA: Diagnosis not present

## 2016-06-03 DIAGNOSIS — Z7984 Long term (current) use of oral hypoglycemic drugs: Secondary | ICD-10-CM | POA: Diagnosis not present

## 2016-06-03 DIAGNOSIS — Z888 Allergy status to other drugs, medicaments and biological substances status: Secondary | ICD-10-CM | POA: Insufficient documentation

## 2016-06-03 DIAGNOSIS — Z8673 Personal history of transient ischemic attack (TIA), and cerebral infarction without residual deficits: Secondary | ICD-10-CM | POA: Diagnosis not present

## 2016-06-03 DIAGNOSIS — E119 Type 2 diabetes mellitus without complications: Secondary | ICD-10-CM | POA: Diagnosis not present

## 2016-06-03 DIAGNOSIS — K2970 Gastritis, unspecified, without bleeding: Secondary | ICD-10-CM

## 2016-06-03 DIAGNOSIS — T39015A Adverse effect of aspirin, initial encounter: Secondary | ICD-10-CM | POA: Insufficient documentation

## 2016-06-03 DIAGNOSIS — I10 Essential (primary) hypertension: Secondary | ICD-10-CM | POA: Diagnosis not present

## 2016-06-03 DIAGNOSIS — E785 Hyperlipidemia, unspecified: Secondary | ICD-10-CM | POA: Diagnosis not present

## 2016-06-03 DIAGNOSIS — K2990 Gastroduodenitis, unspecified, without bleeding: Secondary | ICD-10-CM | POA: Diagnosis not present

## 2016-06-03 DIAGNOSIS — Z91013 Allergy to seafood: Secondary | ICD-10-CM | POA: Diagnosis not present

## 2016-06-03 DIAGNOSIS — Z87891 Personal history of nicotine dependence: Secondary | ICD-10-CM | POA: Insufficient documentation

## 2016-06-03 DIAGNOSIS — Z955 Presence of coronary angioplasty implant and graft: Secondary | ICD-10-CM | POA: Diagnosis not present

## 2016-06-03 DIAGNOSIS — K259 Gastric ulcer, unspecified as acute or chronic, without hemorrhage or perforation: Secondary | ICD-10-CM | POA: Diagnosis present

## 2016-06-03 HISTORY — DX: Gastro-esophageal reflux disease without esophagitis: K21.9

## 2016-06-03 HISTORY — PX: ESOPHAGOGASTRODUODENOSCOPY: SHX5428

## 2016-06-03 LAB — CBC WITH DIFFERENTIAL/PLATELET
BASOS PCT: 1 %
Basophils Absolute: 0.1 10*3/uL (ref 0.0–0.1)
EOS PCT: 4 %
Eosinophils Absolute: 0.3 10*3/uL (ref 0.0–0.7)
HEMATOCRIT: 36.7 % (ref 36.0–46.0)
HEMOGLOBIN: 10.6 g/dL — AB (ref 12.0–15.0)
Lymphocytes Relative: 18 %
Lymphs Abs: 1.5 10*3/uL (ref 0.7–4.0)
MCH: 19 pg — ABNORMAL LOW (ref 26.0–34.0)
MCHC: 28.9 g/dL — ABNORMAL LOW (ref 30.0–36.0)
MCV: 65.7 fL — AB (ref 78.0–100.0)
MONOS PCT: 5 %
Monocytes Absolute: 0.4 10*3/uL (ref 0.1–1.0)
Neutro Abs: 5.9 10*3/uL (ref 1.7–7.7)
Neutrophils Relative %: 72 %
Platelets: 311 10*3/uL (ref 150–400)
RBC: 5.59 MIL/uL — AB (ref 3.87–5.11)
RDW: 26.6 % — AB (ref 11.5–15.5)
WBC: 8.2 10*3/uL (ref 4.0–10.5)

## 2016-06-03 LAB — GLUCOSE, CAPILLARY: Glucose-Capillary: 90 mg/dL (ref 65–99)

## 2016-06-03 LAB — FERRITIN: Ferritin: 9 ng/mL — ABNORMAL LOW (ref 11–307)

## 2016-06-03 SURGERY — EGD (ESOPHAGOGASTRODUODENOSCOPY)
Anesthesia: Moderate Sedation

## 2016-06-03 MED ORDER — LIDOCAINE VISCOUS 2 % MT SOLN
OROMUCOSAL | Status: AC
  Filled 2016-06-03: qty 15

## 2016-06-03 MED ORDER — SIMETHICONE 40 MG/0.6ML PO SUSP
ORAL | Status: DC
Start: 2016-06-03 — End: 2016-06-03
  Filled 2016-06-03: qty 30

## 2016-06-03 MED ORDER — MIDAZOLAM HCL 5 MG/5ML IJ SOLN
INTRAMUSCULAR | Status: AC
  Filled 2016-06-03: qty 5

## 2016-06-03 MED ORDER — MEPERIDINE HCL 100 MG/ML IJ SOLN
INTRAMUSCULAR | Status: AC
  Filled 2016-06-03: qty 1

## 2016-06-03 MED ORDER — MIDAZOLAM HCL 5 MG/5ML IJ SOLN
INTRAMUSCULAR | Status: DC | PRN
  Administered 2016-06-03 (×2): 2 mg via INTRAVENOUS

## 2016-06-03 MED ORDER — SODIUM CHLORIDE 0.9 % IV SOLN
INTRAVENOUS | Status: DC
  Administered 2016-06-03: 12:00:00 via INTRAVENOUS

## 2016-06-03 MED ORDER — SIMETHICONE 40 MG/0.6ML PO SUSP
ORAL | Status: DC | PRN
  Administered 2016-06-03: 12:00:00

## 2016-06-03 MED ORDER — MEPERIDINE HCL 100 MG/ML IJ SOLN
INTRAMUSCULAR | Status: DC | PRN
  Administered 2016-06-03 (×2): 25 mg via INTRAVENOUS

## 2016-06-03 MED ORDER — LIDOCAINE VISCOUS 2 % MT SOLN
OROMUCOSAL | Status: DC | PRN
  Administered 2016-06-03: 1 via OROMUCOSAL

## 2016-06-03 NOTE — Op Note (Signed)
[facility] Patient Name: Nancy Liu Procedure Date: 06/03/2016 11:55 AM MRN: [id number] Date of Birth: Oct 15, 1940 Attending MD: Barney Drain , MD CSN: [id number] Age: 76 Admit Type: Outpatient Procedure:                Upper GI endoscopy, DIAGNOSTIC Indications:              Surveillance procedure-EGD OCT 2017 GASTRIC                            ULCERS/SB AVMs. CBC TODAY-Hb 10.6 MCV 65.9. ADVERSE                            DRUG REACTION OT PROTONIX. Providers:                Barney Drain, MD, Janeece Riggers, RN, Aram Candela Referring MD:             Elyn Peers MD Medicines:                Meperidine 50 mg IV, Midazolam 4 mg IV Complications:            No immediate complications. Estimated Blood Loss:     Estimated blood loss: none. Procedure:                Pre-Anesthesia Assessment:                           - Prior to the procedure, a History and Physical                            was performed, and patient medications and                            allergies were reviewed. The patient's tolerance of                            previous anesthesia was also reviewed. The risks                            and benefits of the procedure and the sedation                            options and risks were discussed with the patient.                            All questions were answered, and informed consent                            was obtained. Prior Anticoagulants: The patient has                            taken aspirin, last dose was 1 day prior to                            procedure. ASA Grade Assessment: II - A patient  with mild systemic disease. After reviewing the                            risks and benefits, the patient was deemed in                            satisfactory condition to undergo the procedure.                            After obtaining informed consent, the endoscope was                            passed under direct  vision. Throughout the                            procedure, the patient's blood pressure, pulse, and                            oxygen saturations were monitored continuously. The                            [id number]) scope was introduced through the                            mouth, and advanced to the second part of duodenum.                            The upper GI endoscopy was accomplished without                            difficulty. The patient tolerated the procedure                            well. Scope In: 12:10:44 PM Scope Out: 12:14:21 PM Total Procedure Duration: 0 hours 3 minutes 37 seconds  Findings:      The examined esophagus was normal.      Patchy minimal inflammation characterized by erosions and erythema was       found in the gastric antrum.      The examined duodenum was normal. Impression:               - MILD EROSIVE Gastritis DUE TO ASA USE. Moderate Sedation:      Moderate (conscious) sedation was administered by the endoscopy nurse       and supervised by the endoscopist. The following parameters were       monitored: oxygen saturation, heart rate, blood pressure, and response       to care. Total physician intraservice time was 11 minutes. Recommendation:           - Resume previous diet.                           - Continue present medications. CONTINUE PREVACID &                            PO IRON.                           -  Return to GI office in 4 months.                           - Patient has a contact number available for                            emergencies. The signs and symptoms of potential                            delayed complications were discussed with the                            patient. Return to normal activities tomorrow.                            Written discharge instructions were provided to the                            patient. Procedure Code(s):        --- Professional ---                           [phone],  Esophagogastroduodenoscopy, flexible,                            transoral; diagnostic, including collection of                            specimen(s) by brushing or washing, when performed                            (separate procedure)                           99152, Moderate sedation services provided by the                            same physician or other qualified health care                            professional performing the diagnostic or                            therapeutic service that the sedation supports,                            requiring the presence of an independent trained                            observer to assist in the monitoring of the                            patient's level of consciousness and physiological  status; initial 15 minutes of intraservice time,                            patient age 41 years or older Diagnosis Code(s):        --- Professional ---                           K29.70, Gastritis, unspecified, without bleeding CPT copyright 2016 American Medical Association. All rights reserved. The codes documented in this report are preliminary and upon coder review may  be revised to meet current compliance requirements. Barney Drain, MD Barney Drain, MD 06/03/2016 12:45:33 PM This report has been signed electronically. Number of Addenda: 0

## 2016-06-03 NOTE — Discharge Instructions (Signed)
THE ULCERS ARE GONE.    CONTINUE ASPIRIN.  CONTINUE LANSOPRAZOLTO PREVENT ULCERSE .  CONTINUE IRON AND INCREASE TO TWICE DAILY. YOU NEED THE EXTRA IRON BECAUSE YOU HAVE AVMs AND NEED ASPIRIN.  FOLLOW UP IN 4 MOS. YOU NEED YOUR BLOOD COUNT(CBC) AND IRON STORES(FERRITIN) CHECKED 1 WEEK PRIOR TO YOUR VISIT.     UPPER ENDOSCOPY AFTER CARE Read the instructions outlined below and refer to this sheet in the next week. These discharge instructions provide you with general information on caring for yourself after you leave the hospital. While your treatment has been planned according to the most current medical practices available, unavoidable complications occasionally occur. If you have any problems or questions after discharge, call DR. FIELDS, [phone].  ACTIVITY  You may resume your regular activity, but move at a slower pace for the next 24 hours.   Take frequent rest periods for the next 24 hours.   Walking will help get rid of the air and reduce the bloated feeling in your belly (abdomen).   No driving for 24 hours (because of the medicine (anesthesia) used during the test).   You may shower.   Do not sign any important legal documents or operate any machinery for 24 hours (because of the anesthesia used during the test).    NUTRITION  Drink plenty of fluids.   You may resume your normal diet as instructed by your doctor.   Begin with a light meal and progress to your normal diet. Heavy or fried foods are harder to digest and may make you feel sick to your stomach (nauseated).   Avoid alcoholic beverages for 24 hours or as instructed.    MEDICATIONS  You may resume your normal medications.   WHAT YOU CAN EXPECT TODAY  Some feelings of bloating in the abdomen.   Passage of more gas than usual.    IF YOU HAD A BIOPSY TAKEN DURING THE UPPER ENDOSCOPY:  Eat a soft diet IF YOU HAVE NAUSEA, BLOATING, ABDOMINAL PAIN, OR VOMITING.    FINDING OUT THE RESULTS OF  YOUR TEST Not all test results are available during your visit. DR. Oneida Alar WILL CALL YOU WITHIN 7 DAYS OF YOUR PROCEDUE WITH YOUR RESULTS. Do not assume everything is normal if you have not heard from DR. FIELDS IN ONE WEEK, CALL HER OFFICE AT [phone].  SEEK IMMEDIATE MEDICAL ATTENTION AND CALL THE OFFICE: [phone] IF:  You have more than a spotting of blood in your stool.   Your belly is swollen (abdominal distention).   You are nauseated or vomiting.   You have a temperature over 101F.   You have abdominal pain or discomfort that is severe or gets worse throughout the day.   Arteriovenous Malformation An arteriovenous malformation (AVM) is a disorder that CAUSE BLOOD VESSELS TO BE ON THE SURFACE OF YOUR GI TRACT. It is characterized by a complex, tangled web of arteries and veins THA HAVE A TENDENCY TO BLEED. An AVM may occur in the STOMACH, COLON, OR SMALL BOWEL.  SYMPTOMS  The most common problems (symptoms) of AVM include:  Bleeding (hemorrhaging).   ANEMIA  TREATMENT  The treatment for AVMs: **ABLATION WITH HEAT

## 2016-06-03 NOTE — H&P (Addendum)
Primary Care Physician:  Elyn Peers, MD Primary Gastroenterologist:  Dr. Oneida Alar  Pre-Procedure History & Physical: HPI:  Nancy Liu is a 76 y.o. female here for peptic ulcer disease. Last EGD OCT 2017.  Past Medical History:  Diagnosis Date  . Arteriosclerotic cardiovascular disease (ASCVD) 2001   Bare-metal stent placed in the right coronary artery in 12/01; residual 50% lesion of the first diagonal and mid LAD  . Cerebrovascular disease   . Diabetes mellitus    excellent control with a low-dose of a single oral agent  . GERD (gastroesophageal reflux disease)   . History of right coronary artery stent placement 2000  . Hyperlipidemia   . Hypertension   . Tobacco abuse, in remission    35 pack years; Quit in 1980    Past Surgical History:  Procedure Laterality Date  . COLONOSCOPY  2006   Dr. Collene Mares: normal  . COLONOSCOPY  2000   Dr. Everlean Cherry: normal   . COLONOSCOPY N/A 03/03/2016   Procedure: COLONOSCOPY;  Surgeon: Danie Binder, MD;  Location: [facility];  Service: Endoscopy;  Laterality: N/A;  . DILATION AND CURETTAGE OF UTERUS    . ESOPHAGOGASTRODUODENOSCOPY  2000   Dr. Everlean Cherry: gastritis   . ESOPHAGOGASTRODUODENOSCOPY  2006   Dr. Collene Mares: small hiatal hernia, gastritis, negative H.pylori   . ESOPHAGOGASTRODUODENOSCOPY N/A 03/03/2016   Procedure: ESOPHAGOGASTRODUODENOSCOPY (EGD);  Surgeon: Danie Binder, MD;  Location: [facility];  Service: Endoscopy;  Laterality: N/A;  . INGUINAL HERNIA REPAIR  1970s   Right  . VAGINAL HYSTERECTOMY  1980   Unilateral oophorectomy    Prior to Admission medications   Medication Sig Start Date End Date Taking? Authorizing Provider  amLODipine (NORVASC) 10 MG tablet Take 1 tablet (10 mg total) by mouth daily. 01/22/16  Yes Arnoldo Lenis, MD  aspirin 81 MG tablet Take 81 mg by mouth daily.     Yes Historical Provider, MD  bimatoprost (LUMIGAN) 0.01 % SOLN Place 1 drop into both eyes at bedtime.   Yes Historical Provider, MD   carvedilol (COREG) 3.125 MG tablet TAKE ONE TABLET BY MOUTH TWICE DAILY Patient taking differently: TAKE TWO TABLETS BY MOUTH DAILY 08/31/15  Yes Arnoldo Lenis, MD  ferrous sulfate 325 (65 FE) MG EC tablet Take 325 mg by mouth daily with breakfast.   Yes Historical Provider, MD  hydroxypropyl methylcellulose / hypromellose (ISOPTO TEARS / GONIOVISC) 2.5 % ophthalmic solution Place 1 drop into both eyes 3 (three) times daily as needed for dry eyes.   Yes Historical Provider, MD  irbesartan (AVAPRO) 300 MG tablet Take 1 tablet (300 mg total) by mouth daily. 10/16/15  Yes Arnoldo Lenis, MD  lansoprazole (PREVACID) 15 MG capsule Take 15 mg by mouth daily at 12 noon.    Yes Historical Provider, MD  metFORMIN (GLUCOPHAGE) 500 MG tablet Take 500 mg by mouth daily with breakfast.   Yes Historical Provider, MD  rosuvastatin (CRESTOR) 20 MG tablet Take 1 tablet (20 mg total) by mouth daily. 03/06/16  Yes Lendon Colonel, NP    Allergies as of 05/13/2016 - Review Complete 05/12/2016  Allergen Reaction Noted  . Protonix [pantoprazole sodium]  03/27/2016  . Shellfish allergy  02/18/2011    Family History  Problem Relation Age of Onset  . Diabetes Sister   . Heart disease Mother     also hypertension and asthma  . Cancer Mother   . Colon cancer Neg Hx   . Colon polyps Neg  Hx     Social History   Social History  . Marital status: Divorced    Spouse name: N/A  . Number of children: 3  . Years of education: N/A   Occupational History  . Retired     Environmental manager work   Social History Main Topics  . Smoking status: Former Smoker    Packs/day: 1.00    Years: 18.00    Types: Cigarettes    Start date: 06/02/1965    Quit date: 06/09/1980  . Smokeless tobacco: Never Used  . Alcohol use No  . Drug use: No  . Sexual activity: No   Other Topics Concern  . Not on file   Social History Narrative  . No narrative on file    Review of Systems: See HPI, otherwise negative ROS   Physical  Exam: BP 136/71   Pulse 73   Temp 98 F (36.7 C) (Oral)   Resp 20   Ht 5' 2.5" (1.588 m)   Wt 171 lb (77.6 kg)   SpO2 98%   BMI 30.78 kg/m  General:   Alert,  pleasant and cooperative in NAD Head:  Normocephalic and atraumatic. Neck:  Supple; Lungs:  Clear throughout to auscultation.    Heart:  Regular rate and rhythm. Abdomen:  Soft, nontender and nondistended. Normal bowel sounds, without guarding, and without rebound.   Neurologic:  Alert and  oriented x4;  grossly normal neurologically.  Impression/Plan:    PUD  PLAN:  REPEAT EGD TO CONFIRM ULCERS ARE HEALED. DISCUSSED PROCEDURE, BENEFITS, & RISKS: < 1% chance of medication reaction, perforation, OR bleeding.

## 2016-06-04 ENCOUNTER — Telehealth: Payer: Self-pay | Admitting: Gastroenterology

## 2016-06-04 NOTE — Telephone Encounter (Signed)
PLEASE CALL PT. HER CHECK YOUR IRON STORES ARE LOW DUE TO THE NEED FOR ASPIRIN AND HAVING GASTRITIS/AVMs.    CONTINUE ASPIRIN.  CONTINUE LANSOPRAZOLETO PREVENT ULCERS .  CONTINUE IRON AND INCREASE TO TWICE DAILY. YOU NEED THE EXTRA IRON BECAUSE YOU HAVE AVMs AND NEED ASPIRIN.  FOLLOW UP IN 4 MOS E30 FEDA/AVMs.  CHECK BLOOD COUNT(CBC) AND IRON STORES(FERRITIN) CHECKED 1 WEEK PRIOR TO YOUR VISIT.  PLEASE CALL WITH QUESTIONS OR CONCERNS.

## 2016-06-05 ENCOUNTER — Other Ambulatory Visit: Payer: Self-pay

## 2016-06-05 ENCOUNTER — Encounter (HOSPITAL_COMMUNITY): Payer: Self-pay | Admitting: Gastroenterology

## 2016-06-05 ENCOUNTER — Telehealth: Payer: Self-pay | Admitting: Gastroenterology

## 2016-06-05 DIAGNOSIS — D509 Iron deficiency anemia, unspecified: Secondary | ICD-10-CM

## 2016-06-05 NOTE — Telephone Encounter (Signed)
OV made °

## 2016-06-05 NOTE — Telephone Encounter (Signed)
(  FERRITIN) CHECKED 1 WEEK PRIOR TO OFFICE VISIT

## 2016-06-05 NOTE — Telephone Encounter (Signed)
Pt is aware and lab orders on file.

## 2016-06-05 NOTE — Telephone Encounter (Signed)
Order on file

## 2016-06-23 DIAGNOSIS — D649 Anemia, unspecified: Secondary | ICD-10-CM | POA: Diagnosis not present

## 2016-06-23 DIAGNOSIS — R21 Rash and other nonspecific skin eruption: Secondary | ICD-10-CM | POA: Diagnosis not present

## 2016-06-26 DIAGNOSIS — H401131 Primary open-angle glaucoma, bilateral, mild stage: Secondary | ICD-10-CM | POA: Diagnosis not present

## 2016-06-26 DIAGNOSIS — H43813 Vitreous degeneration, bilateral: Secondary | ICD-10-CM | POA: Diagnosis not present

## 2016-06-26 DIAGNOSIS — E119 Type 2 diabetes mellitus without complications: Secondary | ICD-10-CM | POA: Diagnosis not present

## 2016-07-04 DIAGNOSIS — E119 Type 2 diabetes mellitus without complications: Secondary | ICD-10-CM | POA: Diagnosis not present

## 2016-07-04 DIAGNOSIS — H43813 Vitreous degeneration, bilateral: Secondary | ICD-10-CM | POA: Diagnosis not present

## 2016-07-04 DIAGNOSIS — H401131 Primary open-angle glaucoma, bilateral, mild stage: Secondary | ICD-10-CM | POA: Diagnosis not present

## 2016-07-21 ENCOUNTER — Other Ambulatory Visit: Payer: Self-pay | Admitting: Cardiology

## 2016-07-23 ENCOUNTER — Encounter (HOSPITAL_COMMUNITY): Payer: Self-pay

## 2016-07-23 ENCOUNTER — Encounter (HOSPITAL_COMMUNITY): Payer: Medicare Other | Attending: Oncology | Admitting: Oncology

## 2016-07-23 ENCOUNTER — Encounter (HOSPITAL_COMMUNITY): Payer: Medicare Other

## 2016-07-23 VITALS — BP 143/84 | HR 74 | Temp 98.6°F | Resp 16 | Ht 62.5 in | Wt 173.0 lb

## 2016-07-23 DIAGNOSIS — Z9889 Other specified postprocedural states: Secondary | ICD-10-CM | POA: Diagnosis not present

## 2016-07-23 DIAGNOSIS — E119 Type 2 diabetes mellitus without complications: Secondary | ICD-10-CM | POA: Diagnosis not present

## 2016-07-23 DIAGNOSIS — D500 Iron deficiency anemia secondary to blood loss (chronic): Secondary | ICD-10-CM | POA: Diagnosis not present

## 2016-07-23 DIAGNOSIS — Z833 Family history of diabetes mellitus: Secondary | ICD-10-CM | POA: Insufficient documentation

## 2016-07-23 DIAGNOSIS — Z7984 Long term (current) use of oral hypoglycemic drugs: Secondary | ICD-10-CM | POA: Insufficient documentation

## 2016-07-23 DIAGNOSIS — Z87891 Personal history of nicotine dependence: Secondary | ICD-10-CM | POA: Diagnosis not present

## 2016-07-23 DIAGNOSIS — Z7982 Long term (current) use of aspirin: Secondary | ICD-10-CM | POA: Insufficient documentation

## 2016-07-23 DIAGNOSIS — R718 Other abnormality of red blood cells: Secondary | ICD-10-CM | POA: Diagnosis not present

## 2016-07-23 DIAGNOSIS — I2510 Atherosclerotic heart disease of native coronary artery without angina pectoris: Secondary | ICD-10-CM | POA: Insufficient documentation

## 2016-07-23 DIAGNOSIS — D509 Iron deficiency anemia, unspecified: Secondary | ICD-10-CM | POA: Insufficient documentation

## 2016-07-23 DIAGNOSIS — Z79899 Other long term (current) drug therapy: Secondary | ICD-10-CM | POA: Diagnosis not present

## 2016-07-23 DIAGNOSIS — D751 Secondary polycythemia: Secondary | ICD-10-CM | POA: Diagnosis not present

## 2016-07-23 DIAGNOSIS — Z90710 Acquired absence of both cervix and uterus: Secondary | ICD-10-CM | POA: Diagnosis not present

## 2016-07-23 DIAGNOSIS — Z91013 Allergy to seafood: Secondary | ICD-10-CM | POA: Diagnosis not present

## 2016-07-23 LAB — COMPREHENSIVE METABOLIC PANEL
ALBUMIN: 4.1 g/dL (ref 3.5–5.0)
ALT: 17 U/L (ref 14–54)
AST: 23 U/L (ref 15–41)
Alkaline Phosphatase: 77 U/L (ref 38–126)
Anion gap: 8 (ref 5–15)
BUN: 15 mg/dL (ref 6–20)
CO2: 28 mmol/L (ref 22–32)
CREATININE: 0.77 mg/dL (ref 0.44–1.00)
Calcium: 9.2 mg/dL (ref 8.9–10.3)
Chloride: 104 mmol/L (ref 101–111)
GFR calc Af Amer: >60 mL/min (ref >60)
GFR calc non Af Amer: >60 mL/min (ref >60)
GLUCOSE: 96 mg/dL (ref 65–99)
Potassium: 4.2 mmol/L (ref 3.5–5.1)
SODIUM: 140 mmol/L (ref 135–145)
Total Bilirubin: 0.4 mg/dL (ref 0.3–1.2)
Total Protein: 7.1 g/dL (ref 6.5–8.1)

## 2016-07-23 LAB — IRON AND TIBC
Iron: 57 ug/dL (ref 28–170)
Saturation Ratios: 16 % (ref 10.4–31.8)
TIBC: 367 ug/dL (ref 250–450)
UIBC: 310 ug/dL

## 2016-07-23 LAB — CBC WITH DIFFERENTIAL/PLATELET
BASOS ABS: 0 10*3/uL (ref 0.0–0.1)
BASOS PCT: 0 %
EOS ABS: 0.5 10*3/uL (ref 0.0–0.7)
Eosinophils Relative: 5 %
HCT: 40.3 % (ref 36.0–46.0)
Hemoglobin: 12.5 g/dL (ref 12.0–15.0)
LYMPHS PCT: 20 %
Lymphs Abs: 1.9 10*3/uL (ref 0.7–4.0)
MCH: 21.9 pg — ABNORMAL LOW (ref 26.0–34.0)
MCHC: 31 g/dL (ref 30.0–36.0)
MCV: 70.5 fL — AB (ref 78.0–100.0)
Monocytes Absolute: 0.7 10*3/uL (ref 0.1–1.0)
Monocytes Relative: 7 %
NEUTROS PCT: 68 %
Neutro Abs: 6.6 10*3/uL (ref 1.7–7.7)
Platelets: 281 10*3/uL (ref 150–400)
RBC: 5.72 MIL/uL — ABNORMAL HIGH (ref 3.87–5.11)
RDW: 25.1 % — ABNORMAL HIGH (ref 11.5–15.5)
WBC: 9.7 10*3/uL (ref 4.0–10.5)

## 2016-07-23 LAB — RETICULOCYTES
RBC.: 5.72 MIL/uL — AB (ref 3.87–5.11)
RETIC CT PCT: 1 % (ref 0.4–3.1)
Retic Count, Absolute: 57.2 10*3/uL (ref 19.0–186.0)

## 2016-07-23 LAB — FERRITIN: Ferritin: 14 ng/mL (ref 11–307)

## 2016-07-23 LAB — VITAMIN B12: VITAMIN B 12: 251 pg/mL (ref 180–914)

## 2016-07-23 LAB — FOLATE: Folate: 19.1 ng/mL (ref >5.9)

## 2016-07-23 NOTE — Patient Instructions (Signed)
[facility] at [facility] Discharge Instructions  RECOMMENDATIONS MADE BY THE CONSULTANT AND ANY TEST RESULTS WILL BE SENT TO YOUR REFERRING PHYSICIAN.  You were seen today by Dr. Twana First Labs today Follow up in 1 week to review labs See Amy up front for appointments   Thank you for choosing [facility] at [facility] to provide your oncology and hematology care.  To afford each patient quality time with our provider, please arrive at least 15 minutes before your scheduled appointment time.    If you have a lab appointment with the [facility] please come in thru the  Main Entrance and check in at the main information desk  You need to re-schedule your appointment should you arrive 10 or more minutes late.  We strive to give you quality time with our providers, and arriving late affects you and other patients whose appointments are after yours.  Also, if you no show three or more times for appointments you may be dismissed from the clinic at the providers discretion.     Again, thank you for choosing [facility].  Our hope is that these requests will decrease the amount of time that you wait before being seen by our physicians.       _____________________________________________________________  Should you have questions after your visit to [facility], please contact our office at [phone] between the hours of 8:30 a.m. and 4:30 p.m.  Voicemails left after 4:30 p.m. will not be returned until the following business day.  For prescription refill requests, have your pharmacy contact our office.       Resources For Cancer Patients and their Caregivers ? American Cancer Society: Can assist with transportation, wigs, general needs, runs Look Good Feel Better.        574-031-8465 ? Cancer Care: Provides financial assistance, online support groups, medication/co-pay assistance.  1-800-813-HOPE  5080192631) ? [facility] Assists [place] cancer patients and their families through emotional , educational and financial support.  586-413-5050 ? [place] DSS Where to apply for food stamps, Medicaid and utility assistance. 519-253-3315 ? [facility]: Transportation to medical appointments. 405-698-2805 ? Social Security Administration: May apply for disability if have a Stage IV cancer. 726-383-3278 858-062-3684 ? [employer], Disability and Transit Services: Assists with nutrition, care and transit needs. [place] Support Programs: @10RELATIVEDAYS @ > Cancer Support Group  2nd Tuesday of the month 1pm-2pm, Journey Room  > Creative Journey  3rd Tuesday of the month 1130am-1pm, Journey Room  > Look Good Feel Better  1st Wednesday of the month 10am-12 noon, Journey Room (Call [place] to register [phone])

## 2016-07-23 NOTE — Progress Notes (Signed)
[facility] NOTE  Patient Care Team: Lucianne Lei, MD as PCP - General (Family Medicine) Yehuda Savannah, MD (Cardiology) Lucianne Lei, MD (Family Medicine)  CHIEF COMPLAINTS/PURPOSE OF CONSULTATION:  Anemia, Unspecified   No history exists.    HISTORY OF PRESENTING ILLNESS:  Nancy Liu 76 y.o. female is here because of anemia.   Patient was initially admitted into the hospital 4 months ago for symptomatic anemia with a hemoglobin of 6.6 g/dL. An EGD was performed and she was noted to have 2 bleeding gastric ulcers. She states she was having a lot of ice craving and very dark stools at that time. She was given 2 units pRBC transfusion and was started on oral iron BID. She had a repeat endoscopy in January 2018 which demonstrated resolution of her gastric ulcers. Hemoglobin in Jan 2018 was 10.6 g/dL, ferritin of 9. Denies any other concerns today.   MEDICAL HISTORY:  Past Medical History:  Diagnosis Date  . Arteriosclerotic cardiovascular disease (ASCVD) 2001   Bare-metal stent placed in the right coronary artery in 12/01; residual 50% lesion of the first diagonal and mid LAD  . Cerebrovascular disease   . Diabetes mellitus    excellent control with a low-dose of a single oral agent  . GERD (gastroesophageal reflux disease)   . History of right coronary artery stent placement 2000  . Hyperlipidemia   . Hypertension   . Tobacco abuse, in remission    35 pack years; Quit in [place]: Past Surgical History:  Procedure Laterality Date  . COLONOSCOPY  2006   Dr. Collene Mares: normal  . COLONOSCOPY  2000   Dr. Everlean Cherry: normal   . COLONOSCOPY N/A 03/03/2016   Procedure: COLONOSCOPY;  Surgeon: Danie Binder, MD;  [facility];  Service: Endoscopy;  Laterality: N/A;  . DILATION AND CURETTAGE OF UTERUS    . ESOPHAGOGASTRODUODENOSCOPY  2000   Dr. Everlean Cherry: gastritis   . ESOPHAGOGASTRODUODENOSCOPY  2006   Dr. Collene Mares: small hiatal hernia,  gastritis, negative H.pylori   . ESOPHAGOGASTRODUODENOSCOPY N/A 03/03/2016   Procedure: ESOPHAGOGASTRODUODENOSCOPY (EGD);  Surgeon: Danie Binder, MD;  [facility];  Service: Endoscopy;  Laterality: N/A;  . ESOPHAGOGASTRODUODENOSCOPY N/A 06/03/2016   Procedure: ESOPHAGOGASTRODUODENOSCOPY (EGD);  Surgeon: Danie Binder, MD;  [facility];  Service: Endoscopy;  Laterality: N/A;  130   . INGUINAL HERNIA REPAIR  1970s   Right  . VAGINAL HYSTERECTOMY  1980   Unilateral oophorectomy    SOCIAL HISTORY: Social History   Social History  . Marital status: Divorced    Spouse name: N/A  . Number of children: 3  . Years of education: N/A   Occupational History  . Retired     Environmental manager work   Social History Main Topics  . Smoking status: Former Smoker    Packs/day: 1.00    Years: 18.00    Types: Cigarettes    Start date: 06/02/1965    Quit date: 06/09/1980  . Smokeless tobacco: Never Used  . Alcohol use No  . Drug use: No  . Sexual activity: No   Other Topics Concern  . Not on file   Social History Narrative  . No narrative on file    FAMILY HISTORY: Family History  Problem Relation Age of Onset  . Diabetes Sister   . Heart disease Mother     also hypertension and asthma  . Cancer Mother   .  Colon cancer Neg Hx   . Colon polyps Neg Hx     ALLERGIES:  is allergic to protonix [pantoprazole sodium] and shellfish allergy.  MEDICATIONS:  Current Outpatient Prescriptions  Medication Sig Dispense Refill  . amLODipine (NORVASC) 10 MG tablet Take 1 tablet (10 mg total) by mouth daily. 90 tablet 2  . aspirin 81 MG tablet Take 81 mg by mouth daily.      . bimatoprost (LUMIGAN) 0.01 % SOLN Place 1 drop into both eyes at bedtime.    . carvedilol (COREG) 3.125 MG tablet TAKE ONE TABLET BY MOUTH TWICE DAILY 60 tablet 6  . ferrous sulfate 325 (65 FE) MG EC tablet Take 325 mg by mouth daily with breakfast.    . hydroxypropyl methylcellulose / hypromellose (ISOPTO  TEARS / GONIOVISC) 2.5 % ophthalmic solution Place 1 drop into both eyes 3 (three) times daily as needed for dry eyes.    Marland Kitchen irbesartan (AVAPRO) 300 MG tablet Take 1 tablet (300 mg total) by mouth daily. 90 tablet 3  . lansoprazole (PREVACID) 15 MG capsule Take 15 mg by mouth daily at 12 noon.     . metFORMIN (GLUCOPHAGE) 500 MG tablet Take 500 mg by mouth daily with breakfast.    . rosuvastatin (CRESTOR) 20 MG tablet Take 1 tablet (20 mg total) by mouth daily. 30 tablet 3   No current facility-administered medications for this visit.     Review of Systems  Constitutional:       Pica  HENT: Negative.   Eyes: Negative.   Respiratory: Negative.   Cardiovascular: Negative.   Gastrointestinal: Positive for constipation and melena.  Genitourinary: Negative.   Musculoskeletal: Negative.   Skin: Negative.   Neurological: Negative.   Endo/Heme/Allergies: Negative.   Psychiatric/Behavioral: Negative.   All other systems reviewed and are negative. 14 point ROS was done and is otherwise as detailed above or in HPI  PHYSICAL EXAMINATION:   Vitals:   07/23/16 1430  BP: (!) 143/84  Pulse: 74  Resp: 16  Temp: 98.6 F (37 C)   Filed Weights   07/23/16 1430  Weight: 173 lb (78.5 kg)   Physical Exam  Constitutional: She is oriented to person, place, and time and well-developed, well-nourished, and in no distress.  HENT:  Head: Normocephalic and atraumatic.  Eyes: EOM are normal. Pupils are equal, round, and reactive to light.  Neck: Normal range of motion. Neck supple.  Cardiovascular: Normal rate, regular rhythm and normal heart sounds.   Pulmonary/Chest: Effort normal and breath sounds normal.  Abdominal: Soft. Bowel sounds are normal.  Musculoskeletal: Normal range of motion.  Neurological: She is alert and oriented to person, place, and time. Gait normal.  Skin: Skin is warm and dry.  Nursing note and vitals reviewed.  LABORATORY DATA:  I have reviewed the data as listed Lab  Results  Component Value Date   WBC 8.2 06/03/2016   HGB 10.6 (L) 06/03/2016   HCT 36.7 06/03/2016   MCV 65.7 (L) 06/03/2016   PLT 311 06/03/2016   CMP     Component Value Date/Time   NA 135 02/29/2016 1645   K 3.4 (L) 02/29/2016 1645   CL 104 02/29/2016 1645   CO2 25 02/29/2016 1645   GLUCOSE 89 02/29/2016 1645   BUN 11 02/29/2016 1645   CREATININE 0.83 02/29/2016 1645   CALCIUM 8.7 (L) 02/29/2016 1645   PROT 6.8 02/29/2016 1645   ALBUMIN 3.9 02/29/2016 1645   AST 17 02/29/2016 1645   ALT  11 (L) 02/29/2016 1645   ALKPHOS 73 02/29/2016 1645   BILITOT 0.2 (L) 02/29/2016 1645   GFRNONAA >60 02/29/2016 1645   GFRAA >60 02/29/2016 1645   Outside Labs 05/06/2016 Iron, Total  24 (L) UIBC    401 (H) % SAT   6 (L)  RBC   5.48 (H) Hemoglobin   9.5 (L) Hematocrit   33.1 (L) MCV   60.4 (L) MCH   17.3 (L) MCHC   28.7 (L) RDW   24.1 (H)  HDL Cholesterol 40 (L)  Outside Labs 03/31/2016 Iron, Total  20 (L) UIBC   416 (H) % SAT   5 (L)  Ferritin   4 (L) RBC   5.34 (H) Hemoglobin   9.5 (L) Hematocrit  32.2 (L) MCV   60.3 (L) MCH   17.8 (L) MCHC   29.5 (L) RDW   26.3 (H)  HDL Cholesterol 34 (L)   RADIOGRAPHIC STUDIES: I have personally reviewed the radiological images as listed and agreed with the findings in the report. No results found.  ASSESSMENT & PLAN:  Microcytic anemia likely due to iron deficiency from GI Blood loss due to bleeding gastric ulcers in September 2017. I will set her up for IV iron supplementation with injectafer. Her calculated iron deficit is 1455.81 mg. She will return for follow up in 1 week for review of the following anemia workup labs; CBC, CMP, path smear, iron study, ferritin, folate, vitamin B12, reticulocites, copper, SPEP, and immunofixation, hemoglobin electrophoresis.   All questions were answered. The patient knows to call the clinic with any problems, questions or concerns.  This document serves as a record of services  personally performed by Twana First, MD. It was created on her behalf by Martinique Casey, a trained medical scribe. The creation of this record is based on the scribe's personal observations and the provider's statements to them. This document has been checked and approved by the attending provider.  I have reviewed the above documentation for accuracy and completeness and I agree with the above.  This note was electronically signed.    Martinique M Casey  07/23/2016 2:40 PM

## 2016-07-24 LAB — PROTEIN ELECTROPHORESIS, SERUM
A/G RATIO SPE: 1.2 (ref 0.7–1.7)
ALPHA-2-GLOBULIN: 0.9 g/dL (ref 0.4–1.0)
Albumin ELP: 3.7 g/dL (ref 2.9–4.4)
Alpha-1-Globulin: 0.2 g/dL (ref 0.0–0.4)
Beta Globulin: 1 g/dL (ref 0.7–1.3)
Gamma Globulin: 0.8 g/dL (ref 0.4–1.8)
Globulin, Total: 3 (ref 2.2–3.9)
TOTAL PROTEIN ELP: 6.7 g/dL (ref 6.0–8.5)

## 2016-07-24 LAB — PATHOLOGIST SMEAR REVIEW

## 2016-07-25 LAB — IMMUNOFIXATION ELECTROPHORESIS
IGA: 168 mg/dL (ref 64–422)
IgG (Immunoglobin G), Serum: 743 mg/dL (ref 700–1600)
IgM, Serum: 28 mg/dL (ref 26–217)
Total Protein ELP: 6.6 g/dL (ref 6.0–8.5)

## 2016-07-25 LAB — HEMOGLOBINOPATHY EVALUATION
HGB A: 98.1 % (ref 96.4–98.8)
HGB C: 0 %
HGB VARIANT: 1 % — AB
Hgb A2 Quant: 0.9 % — ABNORMAL LOW (ref 1.8–3.2)
Hgb F Quant: 0 % (ref 0.0–2.0)
Hgb S Quant: 0 %

## 2016-07-25 LAB — COPPER, SERUM: Copper: 143 ug/dL (ref 72–166)

## 2016-07-30 ENCOUNTER — Encounter (HOSPITAL_COMMUNITY): Payer: Self-pay | Admitting: Adult Health

## 2016-07-30 ENCOUNTER — Encounter (HOSPITAL_BASED_OUTPATIENT_CLINIC_OR_DEPARTMENT_OTHER): Payer: Medicare Other | Admitting: Adult Health

## 2016-07-30 VITALS — BP 135/78 | HR 80 | Temp 98.1°F | Resp 16 | Ht 62.5 in | Wt 173.5 lb

## 2016-07-30 DIAGNOSIS — D509 Iron deficiency anemia, unspecified: Secondary | ICD-10-CM | POA: Diagnosis not present

## 2016-07-30 MED ORDER — POLYSACCHAR IRON-FA-B12 150-1-25 MG-MG-MCG PO CAPS: 1.0000 | ORAL_CAPSULE | Freq: Every day | ORAL | 6 refills | Status: DC

## 2016-07-30 NOTE — Progress Notes (Signed)
[place],  [address]   CLINIC:  Medical Oncology/Hematology  PCP:  Elyn Peers, MD [address] [place] [address] [phone]   REASON FOR VISIT:  Follow-up for microcytic anemia   CURRENT THERAPY: Initial work-up   HISTORY OF PRESENT ILLNESS:  (From Dr. Laverle Patter visit on 07/23/16)     INTERVAL HISTORY:  Nancy Liu is 76 y.o. female who returns today for microcytic anemia and to review her laboratory studies.   Overall, she feels quite well. Her energy levels and appetite are excellent.  Endorses occasional "heart flutters"; denies any chest pain. These episodes happen very infrequently. She has a cardiologist, who is reportedly aware.    Denies any blood in her stool, hematuria, dysuria, or other frank bleeding.  Stools are dark as she has been taking OTC iron supplement. Otherwise, she is without complaints.   Her mammogram is up-to-date.    REVIEW OF SYSTEMS:  Review of Systems  Constitutional: Negative.  Negative for chills, fatigue and fever.  HENT:  Negative.  Negative for lump/mass and nosebleeds.   Eyes: Negative.   Respiratory: Negative.  Negative for cough and shortness of breath.   Cardiovascular: Negative.  Negative for chest pain and leg swelling.  Gastrointestinal: Negative.  Negative for abdominal pain, blood in stool, constipation, diarrhea, nausea and vomiting.  Endocrine: Negative.   Genitourinary: Negative.  Negative for dysuria and hematuria.   Musculoskeletal: Negative.  Negative for arthralgias.  Skin: Negative.  Negative for rash.  Neurological: Negative.  Negative for dizziness and headaches.  Hematological: Negative.  Negative for adenopathy. Does not bruise/bleed easily.  Psychiatric/Behavioral: Negative.  Negative for depression and sleep disturbance. The patient is not nervous/anxious.      PAST MEDICAL/SURGICAL HISTORY:  Past Medical History:  Diagnosis Date  . Arteriosclerotic  cardiovascular disease (ASCVD) 2001   Bare-metal stent placed in the right coronary artery in 12/01; residual 50% lesion of the first diagonal and mid LAD  . Cerebrovascular disease   . Diabetes mellitus    excellent control with a low-dose of a single oral agent  . GERD (gastroesophageal reflux disease)   . History of right coronary artery stent placement 2000  . Hyperlipidemia   . Hypertension   . Tobacco abuse, in remission    35 pack years; Quit in 1980   Past Surgical History:  Procedure Laterality Date  . COLONOSCOPY  2006   Dr. Collene Mares: normal  . COLONOSCOPY  2000   Dr. Everlean Cherry: normal   . COLONOSCOPY N/A 03/03/2016   Procedure: COLONOSCOPY;  Surgeon: Danie Binder, MD;  [facility];  Service: Endoscopy;  Laterality: N/A;  . DILATION AND CURETTAGE OF UTERUS    . ESOPHAGOGASTRODUODENOSCOPY  2000   Dr. Everlean Cherry: gastritis   . ESOPHAGOGASTRODUODENOSCOPY  2006   Dr. Collene Mares: small hiatal hernia, gastritis, negative H.pylori   . ESOPHAGOGASTRODUODENOSCOPY N/A 03/03/2016   Procedure: ESOPHAGOGASTRODUODENOSCOPY (EGD);  Surgeon: Danie Binder, MD;  [facility];  Service: Endoscopy;  Laterality: N/A;  . ESOPHAGOGASTRODUODENOSCOPY N/A 06/03/2016   Procedure: ESOPHAGOGASTRODUODENOSCOPY (EGD);  Surgeon: Danie Binder, MD;  [facility];  Service: Endoscopy;  Laterality: N/A;  130   . INGUINAL HERNIA REPAIR  1970s   Right  . VAGINAL HYSTERECTOMY  1980   Unilateral oophorectomy     SOCIAL HISTORY:  Social History   Social History  . Marital status: Divorced    Spouse name: N/A  .  Number of children: 3  . Years of education: N/A   Occupational History  . Retired     Environmental manager work   Social History Main Topics  . Smoking status: Former Smoker    Packs/day: 1.00    Years: 18.00    Types: Cigarettes    Start date: 06/02/1965    Quit date: 06/09/1980  . Smokeless tobacco: Never Used  . Alcohol use No  . Drug use: No  . Sexual activity: No   Other  Topics Concern  . Not on file   Social History Narrative  . No narrative on file    FAMILY HISTORY:  Family History  Problem Relation Age of Onset  . Diabetes Sister   . Heart disease Mother     also hypertension and asthma  . Cancer Mother   . Colon cancer Neg Hx   . Colon polyps Neg Hx     CURRENT MEDICATIONS:  Outpatient Encounter Prescriptions as of 07/30/2016  Medication Sig  . amLODipine (NORVASC) 10 MG tablet Take 1 tablet (10 mg total) by mouth daily.  Marland Kitchen aspirin 81 MG tablet Take 81 mg by mouth daily.    . bimatoprost (LUMIGAN) 0.01 % SOLN Place 1 drop into both eyes at bedtime.  . carvedilol (COREG) 3.125 MG tablet TAKE ONE TABLET BY MOUTH TWICE DAILY  . hydroxypropyl methylcellulose / hypromellose (ISOPTO TEARS / GONIOVISC) 2.5 % ophthalmic solution Place 1 drop into both eyes 3 (three) times daily as needed for dry eyes.  Marland Kitchen irbesartan (AVAPRO) 300 MG tablet Take 1 tablet (300 mg total) by mouth daily.  . lansoprazole (PREVACID) 15 MG capsule Take 15 mg by mouth daily at 12 noon.   . metFORMIN (GLUCOPHAGE) 500 MG tablet Take 500 mg by mouth daily with breakfast.  . rosuvastatin (CRESTOR) 20 MG tablet Take 1 tablet (20 mg total) by mouth daily.  . [DISCONTINUED] ferrous sulfate 325 (65 FE) MG EC tablet Take 325 mg by mouth daily with breakfast.  . Polysacchar Iron-FA-B12 (FERREX 150 FORTE) 150-1-25 MG-MG-MCG CAPS Take 1 tablet by mouth daily.   No facility-administered encounter medications on file as of 07/30/2016.     ALLERGIES:  Allergies  Allergen Reactions  . Protonix [Pantoprazole Sodium]     DRY LIPS  . Shellfish Allergy     Unknown reaction     PHYSICAL EXAM:  ECOG Performance status: 0 - Asymptomatic, independent   Vitals:   07/30/16 0947  BP: 135/78  Pulse: 80  Resp: 16  Temp: 98.1 F (36.7 C)   Filed Weights   07/30/16 0947  Weight: 173 lb 8 oz (78.7 kg)    Physical Exam  Constitutional: She is oriented to person, place, and time and  well-developed, well-nourished, and in no distress.  HENT:  Head: Normocephalic.  Mouth/Throat: Oropharynx is clear and moist. No oropharyngeal exudate.  Eyes: Conjunctivae are normal. Pupils are equal, round, and reactive to light. No scleral icterus.  Neck: Normal range of motion. Neck supple.  Cardiovascular: Normal rate, regular rhythm and normal heart sounds.  Exam reveals no gallop.   No murmur heard. Pulmonary/Chest: Effort normal and breath sounds normal. No respiratory distress.  Abdominal: Soft. Bowel sounds are normal. There is no tenderness.  Musculoskeletal: Normal range of motion. She exhibits no edema.  Lymphadenopathy:    She has no cervical adenopathy.       Right: No supraclavicular adenopathy present.       Left: No supraclavicular adenopathy present.  Neurological: She  is alert and oriented to person, place, and time. No cranial nerve deficit. Gait normal.  Skin: Skin is warm and dry. No rash noted.  Psychiatric: Mood, memory, affect and judgment normal.  Nursing note and vitals reviewed.    LABORATORY DATA:  I have reviewed the labs as listed.  CBC    Component Value Date/Time   WBC 9.7 07/23/2016 1453   RBC 5.72 (H) 07/23/2016 1453   RBC 5.72 (H) 07/23/2016 1453   HGB 12.5 07/23/2016 1453   HCT 40.3 07/23/2016 1453   PLT 281 07/23/2016 1453   MCV 70.5 (L) 07/23/2016 1453   MCH 21.9 (L) 07/23/2016 1453   MCHC 31.0 07/23/2016 1453   RDW 25.1 (H) 07/23/2016 1453   LYMPHSABS 1.9 07/23/2016 1453   MONOABS 0.7 07/23/2016 1453   EOSABS 0.5 07/23/2016 1453   BASOSABS 0.0 07/23/2016 1453   CMP Latest Ref Rng & Units 07/23/2016 02/29/2016 01/21/2010  Glucose 65 - 99 mg/dL 96 89 92  BUN 6 - 20 mg/dL 15 11 17   Creatinine 0.44 - 1.00 mg/dL 0.77 0.83 1.23  Sodium 135 - 145 mmol/L 140 135 142  Potassium 3.5 - 5.1 mmol/L 4.2 3.4(L) 3.9  Chloride 101 - 111 mmol/L 104 104 105  CO2 22 - 32 mmol/L 28 25 26   Calcium 8.9 - 10.3 mg/dL 9.2 8.7(L) 9.5  Total Protein 6.5 -  8.1 g/dL 7.1 6.8 6.8  Total Bilirubin 0.3 - 1.2 mg/dL 0.4 0.2(L) -  Alkaline Phos 38 - 126 U/L 77 73 49  AST 15 - 41 U/L 23 17 23   ALT 14 - 54 U/L 17 11(L) 16   Results for Nancy Liu, Nancy Liu (MRN [id number])   Ref. Range 07/23/2016 14:53  Iron Latest Ref Range: 28 - 170 ug/dL 57  UIBC Latest Units: ug/dL 310  TIBC Latest Ref Range: 250 - 450 ug/dL 367  Saturation Ratios Latest Ref Range: 10.4 - 31.8 % 16  Ferritin Latest Ref Range: 11 - 307 ng/mL 14  Folate Latest Ref Range: >5.9 ng/mL 19.1   Results for Nancy Liu, Nancy Liu (MRN [id number])   Ref. Range 07/23/2016 14:53  Copper Latest Ref Range: 72 - 166 ug/dL 143  Vitamin B12 Latest Ref Range: 180 - 914 pg/mL 251   Results for Nancy Liu, Nancy Liu (MRN [id number])   Ref. Range 07/23/2016 14:54  Total Protein ELP Latest Ref Range: 6.0 - 8.5 g/dL 6.7  Albumin ELP Latest Ref Range: 2.9 - 4.4 g/dL 3.7  Globulin, Total Latest Ref Range: 2.2 - 3.9  3.0  A/G Ratio Latest Ref Range: 0.7 - 1.7  1.2  Alpha-1-Globulin Latest Ref Range: 0.0 - 0.4 g/dL 0.2  Alpha-2-Globulin Latest Ref Range: 0.4 - 1.0 g/dL 0.9  Beta Globulin Latest Ref Range: 0.7 - 1.3 g/dL 1.0  Gamma Globulin Latest Ref Range: 0.4 - 1.8 g/dL 0.8  M-SPIKE, % Latest Ref Range: Not Observed g/dL Not Observed  SPE Interp. Unknown Comment  Comment Unknown Comment   Results for Nancy Liu, Nancy Liu (MRN [id number])   Ref. Range 07/23/2016 14:53  Hgb A Latest Ref Range: 96.4 - 98.8 % 98.1  Hgb A2 Quant Latest Ref Range: 1.8 - 3.2 % 0.9 (L)  Hgb F Quant Latest Ref Range: 0.0 - 2.0 % 0.0  Hgb S Quant Latest Ref Range: 0.0 % 0.0  HGB C Latest Ref Range: 0.0 % 0.0  HGB VARIANT Latest Ref Range: 0.0 % 1.0 (H)  Please Note: Unknown Comment     Results for  Nancy Liu, Nancy Liu (MRN [id number])  Ref. Range 07/23/2016 14:54  IgG (Immunoglobin G), Serum Latest Ref Range: 700 - 1,600 mg/dL 743  IgA Latest Ref Range: 64 - 422 mg/dL 168     Results for Nancy Liu, Nancy Liu (MRN [id number])  Ref.  Range 07/23/2016 14:54  Alpha-1-Globulin Latest Ref Range: 0.0 - 0.4 g/dL 0.2  Alpha-2-Globulin Latest Ref Range: 0.4 - 1.0 g/dL 0.9      PENDING LABS:    DIAGNOSTIC IMAGING:    PATHOLOGY:      ASSESSMENT & PLAN:   Microcytic anemia:  -Likely secondary to thalassemia minor given hemoglobin variant. No M-spike or gammopathy.  -Very mild iron deficiency anemia with ferritin 14. She has been taking OTC iron; we will try to optimize her ferritin with prescription-strength oral iron. Prescription sent to pharmacy for [facility] once daily. We discussed possible side effects of oral iron; encouraged her to call us with intolerance.  -Return to cancer center in 4 months with labs for continued follow-up.   Constipation prophylaxis:  -Encouraged her to start Miralax or Senokot OTC for constipation prevention with prescription strength iron.        Dispo:  -Return to cancer center in 4 months with labs for continued follow-up.    All questions were answered to patient's stated satisfaction. Encouraged patient to call with any new concerns or questions before her next visit to the cancer center and we can certain see her sooner, if needed.    Plan of care discussed with Dr. Talbert Cage, who agrees with the above aforementioned.    Orders placed this encounter:  Orders Placed This Encounter  Procedures  . Ferritin  . CBC with Differential/Platelet  . Comprehensive metabolic panel  . Iron and TIBC      Mike Craze, NP [place] [phone]

## 2016-07-30 NOTE — Patient Instructions (Addendum)
[place] at [facility] Discharge Instructions  RECOMMENDATIONS MADE BY THE CONSULTANT AND ANY TEST RESULTS WILL BE SENT TO YOUR REFERRING PHYSICIAN.  You were seen today by Mike Craze NP. Stop taking Over the counter iron (ferrous sulfate). Rx sent to your pharmacy for iron. Consider taking miralax or sennakot for constipation. Return in 4 months for labs and follow up.    Thank you for choosing [place] at [facility] to provide your oncology and hematology care.  To afford each patient quality time with our provider, please arrive at least 15 minutes before your scheduled appointment time.    If you have a lab appointment with the [facility] please come in thru the  Main Entrance and check in at the main information desk  You need to re-schedule your appointment should you arrive 10 or more minutes late.  We strive to give you quality time with our providers, and arriving late affects you and other patients whose appointments are after yours.  Also, if you no show three or more times for appointments you may be dismissed from the clinic at the providers discretion.     Again, thank you for choosing [facility].  Our hope is that these requests will decrease the amount of time that you wait before being seen by our physicians.       _____________________________________________________________  Should you have questions after your visit to [facility], please contact our office at [phone] between the hours of 8:30 a.m. and 4:30 p.m.  Voicemails left after 4:30 p.m. will not be returned until the following business day.  For prescription refill requests, have your pharmacy contact our office.       Resources For Cancer Patients and their Caregivers ? American Cancer Society: Can assist with transportation, wigs, general needs, runs Look Good Feel Better.        786-313-7348 ? Cancer  Care: Provides financial assistance, online support groups, medication/co-pay assistance.  1-800-813-HOPE 774-382-1803) ? [place] Assists [place] cancer patients and their families through emotional , educational and financial support.  367-332-2753 ? [place] DSS Where to apply for food stamps, Medicaid and utility assistance. 251-412-5070 ? [facility]: Transportation to medical appointments. (828) 765-4057 ? Social Security Administration: May apply for disability if have a Stage IV cancer. 563-242-5953 (623) 513-6260 ? [employer], Disability and Transit Services: Assists with nutrition, care and transit needs. [facility] Support Programs: @10RELATIVEDAYS @ > Cancer Support Group  2nd Tuesday of the month 1pm-2pm, Journey Room  > Creative Journey  3rd Tuesday of the month 1130am-1pm, Journey Room  > Look Good Feel Better  1st Wednesday of the month 10am-12 noon, Journey Room (Call [place] to register [phone])

## 2016-08-01 DIAGNOSIS — E119 Type 2 diabetes mellitus without complications: Secondary | ICD-10-CM | POA: Diagnosis not present

## 2016-08-01 DIAGNOSIS — H401131 Primary open-angle glaucoma, bilateral, mild stage: Secondary | ICD-10-CM | POA: Diagnosis not present

## 2016-08-01 DIAGNOSIS — H43813 Vitreous degeneration, bilateral: Secondary | ICD-10-CM | POA: Diagnosis not present

## 2016-08-18 ENCOUNTER — Other Ambulatory Visit: Payer: Self-pay | Admitting: Adult Health

## 2016-08-25 ENCOUNTER — Other Ambulatory Visit: Payer: Self-pay

## 2016-08-25 DIAGNOSIS — D509 Iron deficiency anemia, unspecified: Secondary | ICD-10-CM

## 2016-08-26 DIAGNOSIS — E119 Type 2 diabetes mellitus without complications: Secondary | ICD-10-CM | POA: Diagnosis not present

## 2016-08-26 DIAGNOSIS — I119 Hypertensive heart disease without heart failure: Secondary | ICD-10-CM | POA: Diagnosis not present

## 2016-08-26 DIAGNOSIS — E782 Mixed hyperlipidemia: Secondary | ICD-10-CM | POA: Diagnosis not present

## 2016-09-01 DIAGNOSIS — E782 Mixed hyperlipidemia: Secondary | ICD-10-CM | POA: Diagnosis not present

## 2016-09-01 DIAGNOSIS — J0100 Acute maxillary sinusitis, unspecified: Secondary | ICD-10-CM | POA: Diagnosis not present

## 2016-09-01 DIAGNOSIS — E0800 Diabetes mellitus due to underlying condition with hyperosmolarity without nonketotic hyperglycemic-hyperosmolar coma (NKHHC): Secondary | ICD-10-CM | POA: Diagnosis not present

## 2016-09-02 ENCOUNTER — Other Ambulatory Visit: Payer: Self-pay | Admitting: Adult Health

## 2016-09-03 ENCOUNTER — Other Ambulatory Visit: Payer: Self-pay

## 2016-09-03 MED ORDER — ROSUVASTATIN CALCIUM 20 MG PO TABS: 20.0000 mg | ORAL_TABLET | Freq: Every day | ORAL | 3 refills | Status: DC

## 2016-09-19 DIAGNOSIS — D509 Iron deficiency anemia, unspecified: Secondary | ICD-10-CM | POA: Diagnosis not present

## 2016-09-19 LAB — CBC WITH DIFFERENTIAL/PLATELET
BASOS ABS: 0 {cells}/uL (ref 0–200)
Basophils Relative: 0 %
EOS ABS: 553 {cells}/uL — AB (ref 15–500)
Eosinophils Relative: 7 %
HEMATOCRIT: 40.5 % (ref 35.0–45.0)
HEMOGLOBIN: 12.7 g/dL (ref 11.7–15.5)
LYMPHS ABS: 1501 {cells}/uL (ref 850–3900)
Lymphocytes Relative: 19 %
MCH: 23.1 pg — AB (ref 27.0–33.0)
MCHC: 31.4 g/dL — AB (ref 32.0–36.0)
MCV: 73.6 fL — AB (ref 80.0–100.0)
MONO ABS: 553 {cells}/uL (ref 200–950)
Monocytes Relative: 7 %
NEUTROS ABS: 5293 {cells}/uL (ref 1500–7800)
NEUTROS PCT: 67 %
Platelets: 354 10*3/uL (ref 140–400)
RBC: 5.5 MIL/uL — ABNORMAL HIGH (ref 3.80–5.10)
RDW: 17.7 % — ABNORMAL HIGH (ref 11.0–15.0)
WBC: 7.9 10*3/uL (ref 3.8–10.8)

## 2016-09-19 LAB — FERRITIN: Ferritin: 16 ng/mL — ABNORMAL LOW (ref 20–288)

## 2016-10-01 ENCOUNTER — Ambulatory Visit (INDEPENDENT_AMBULATORY_CARE_PROVIDER_SITE_OTHER): Payer: Medicare Other | Admitting: Cardiology

## 2016-10-01 ENCOUNTER — Encounter: Payer: Self-pay | Admitting: Cardiology

## 2016-10-01 ENCOUNTER — Encounter: Payer: Self-pay | Admitting: Gastroenterology

## 2016-10-01 ENCOUNTER — Ambulatory Visit (INDEPENDENT_AMBULATORY_CARE_PROVIDER_SITE_OTHER): Payer: Medicare Other | Admitting: Gastroenterology

## 2016-10-01 VITALS — BP 138/82 | HR 80 | Ht 62.5 in | Wt 170.0 lb

## 2016-10-01 VITALS — BP 140/85 | HR 67 | Temp 97.6°F | Ht 62.5 in | Wt 170.2 lb

## 2016-10-01 DIAGNOSIS — I2510 Atherosclerotic heart disease of native coronary artery without angina pectoris: Secondary | ICD-10-CM | POA: Diagnosis not present

## 2016-10-01 DIAGNOSIS — R002 Palpitations: Secondary | ICD-10-CM

## 2016-10-01 DIAGNOSIS — K2970 Gastritis, unspecified, without bleeding: Secondary | ICD-10-CM | POA: Diagnosis not present

## 2016-10-01 DIAGNOSIS — E782 Mixed hyperlipidemia: Secondary | ICD-10-CM | POA: Diagnosis not present

## 2016-10-01 DIAGNOSIS — D509 Iron deficiency anemia, unspecified: Secondary | ICD-10-CM

## 2016-10-01 DIAGNOSIS — I10 Essential (primary) hypertension: Secondary | ICD-10-CM

## 2016-10-01 DIAGNOSIS — K2990 Gastroduodenitis, unspecified, without bleeding: Secondary | ICD-10-CM

## 2016-10-01 MED ORDER — CARVEDILOL 6.25 MG PO TABS: 6.2500 mg | ORAL_TABLET | Freq: Two times a day (BID) | ORAL | 3 refills | Status: DC

## 2016-10-01 NOTE — Progress Notes (Signed)
Primary Care Physician: Elyn Peers, MD  Primary Gastroenterologist:  Barney Drain, MD   Chief Complaint  Patient presents with  . Peptic Ulcer Disease    HPI: Nancy Liu is a 75 y.o. female here for follow up of EGD. She was initially seen in hospital in 03/2016 for severe anemia, heme negative. EGD and TCS at that time revealed many non-bleeding gastric ulcers with no bleeding, gastroduodenitis (no h.pylori), nonbleeding AVM in second portion of duodenum. Diverticulosis and nonbleeding hemorrhoids. Repeat EGD 06/2016 showed erosive gastritis but ulcers had headed.   She has been on oral iron and PPI. Hgb normal and ferritin rising but still low. No GI symptoms. Feels well.  Current Outpatient Prescriptions  Medication Sig Dispense Refill  . amLODipine (NORVASC) 10 MG tablet Take 1 tablet (10 mg total) by mouth daily. 90 tablet 2  . aspirin 81 MG tablet Take 81 mg by mouth daily.      . bimatoprost (LUMIGAN) 0.01 % SOLN Place 1 drop into both eyes at bedtime.    . carvedilol (COREG) 6.25 MG tablet Take 1 tablet (6.25 mg total) by mouth 2 (two) times daily. (Patient taking differently: Take 6.25 mg by mouth 2 (two) times daily with a meal. 2 in the am and 2 in the pm) 180 tablet 3  . hydroxypropyl methylcellulose / hypromellose (ISOPTO TEARS / GONIOVISC) 2.5 % ophthalmic solution Place 1 drop into both eyes 3 (three) times daily as needed for dry eyes.    Marland Kitchen irbesartan (AVAPRO) 300 MG tablet Take 1 tablet (300 mg total) by mouth daily. 90 tablet 3  . lansoprazole (PREVACID) 15 MG capsule Take 15 mg by mouth daily at 12 noon.     . metFORMIN (GLUCOPHAGE) 500 MG tablet Take 500 mg by mouth daily with breakfast.    . Polysacchar Iron-FA-B12 (FERREX 150 FORTE) 150-1-25 MG-MG-MCG CAPS Take 1 tablet by mouth daily. 30 capsule 6  . rosuvastatin (CRESTOR) 20 MG tablet TAKE ONE TABLET BY MOUTH ONCE DAILY 30 tablet 3  . rosuvastatin (CRESTOR) 20 MG tablet Take 1 tablet (20 mg total)  by mouth daily. 90 tablet 3   No current facility-administered medications for this visit.     Allergies as of 10/01/2016 - Review Complete 10/01/2016  Allergen Reaction Noted  . Protonix [pantoprazole sodium]  03/27/2016  . Shellfish allergy  02/18/2011    ROS:  General: Negative for anorexia, weight loss, fever, chills, fatigue, weakness. ENT: Negative for hoarseness, difficulty swallowing , nasal congestion. CV: Negative for chest pain, angina, palpitations, dyspnea on exertion, peripheral edema.  Respiratory: Negative for dyspnea at rest, dyspnea on exertion, cough, sputum, wheezing.  GI: See history of present illness. GU:  Negative for dysuria, hematuria, urinary incontinence, urinary frequency, nocturnal urination.  Endo: Negative for unusual weight change.    Physical Examination:   BP 140/85   Pulse 67   Temp 97.6 F (36.4 C) (Oral)   Ht 5' 2.5" (1.588 m)   Wt 170 lb 3.2 oz (77.2 kg)   BMI 30.63 kg/m   General: Well-nourished, well-developed in no acute distress.  Eyes: No icterus. Mouth: Oropharyngeal mucosa moist and pink , no lesions erythema or exudate. Lungs: Clear to auscultation bilaterally.  Heart: Regular rate and rhythm, no murmurs rubs or gallops.  Abdomen: Bowel sounds are normal, nontender, nondistended, no hepatosplenomegaly or masses, no abdominal bruits or hernia , no rebound or guarding.   Extremities: No lower extremity edema. No clubbing or deformities.  Neuro: Alert and oriented x 4   Skin: Warm and dry, no jaundice.   Psych: Alert and cooperative, normal mood and affect.  Labs:  Lab Results  Component Value Date   CREATININE 0.77 07/23/2016   BUN 15 07/23/2016   NA 140 07/23/2016   K 4.2 07/23/2016   CL 104 07/23/2016   CO2 28 07/23/2016   Lab Results  Component Value Date   ALT 17 07/23/2016   AST 23 07/23/2016   ALKPHOS 77 07/23/2016   BILITOT 0.4 07/23/2016   Lab Results  Component Value Date   WBC 7.9 09/19/2016   HGB  12.7 09/19/2016   HCT 40.5 09/19/2016   MCV 73.6 (L) 09/19/2016   PLT 354 09/19/2016   Lab Results  Component Value Date   IRON 57 07/23/2016   TIBC 367 07/23/2016   FERRITIN 16 (L) 09/19/2016   Lab Results  Component Value Date   VITAMINB12 251 07/23/2016   Lab Results  Component Value Date   FOLATE 19.1 07/23/2016    Imaging Studies: No results found.

## 2016-10-01 NOTE — Patient Instructions (Signed)
1. Continue iron and lansoprazole.  2. You have labs ordered at the end of June with the [facility]. We will follow up on results as available and let you know if additional recommendations. 3. Return to the office to see Dr. Oneida Alar in 6 months.

## 2016-10-01 NOTE — Patient Instructions (Signed)
Medication Instructions:  INCREASE COREG TO 6.25 MG - TWO TIMES DAILY   Labwork: I WILL REQUEST A COPY OF LABS FROM PCP  Testing/Procedures: NONE  Follow-Up: Your physician wants you to follow-up in: 6 MONTHS .  You will receive a reminder letter in the mail two months in advance. If you don't receive a letter, please call our office to schedule the follow-up appointment.   Any Other Special Instructions Will Be Listed Below (If Applicable).     If you need a refill on your cardiac medications before your next appointment, please call your pharmacy.

## 2016-10-01 NOTE — Progress Notes (Signed)
Clinical Summary Ms. Lanes is a 76 y.o.female seen today for follow up of the following medical problems.   1. CAD  - prior BMS to RCA in 2001 at [facility]  - 02/2011 MPI no ischemia  - echo 04/2013 showed LVEF 60-65% with grade I diastolic dysfunction.  - no recent chest pain. No recent SOb - compliant with meds  2. HTN  - compliant with meds   3. Hyperlpidiemia  - compliant with statin    4. Palpitations - no recent symptoms. She had been mistakingly only taking her coreg daily, with bid dosing symptoms resolve.d   - no recent palpitations   5. SOB/Abnormal PFTs - former smoker x 30-35 years. - PFTs Jan 2017 with severely redcued DLCO, referred to Dr Luan Pulling.  - followed by Dr Luan Pulling     [facility]: has 3 children. 2 grandchilren, one is a Marine scientist working at childrens hospital in [place].  Past Medical History:  Diagnosis Date  . Arteriosclerotic cardiovascular disease (ASCVD) 2001   Bare-metal stent placed in the right coronary artery in 12/01; residual 50% lesion of the first diagonal and mid LAD  . Cerebrovascular disease   . Diabetes mellitus    excellent control with a low-dose of a single oral agent  . GERD (gastroesophageal reflux disease)   . History of right coronary artery stent placement 2000  . Hyperlipidemia   . Hypertension   . Tobacco abuse, in remission    35 pack years; Quit in 1980     Allergies  Allergen Reactions  . Protonix [Pantoprazole Sodium]     DRY LIPS  . Shellfish Allergy     Unknown reaction     Current Outpatient Prescriptions  Medication Sig Dispense Refill  . amLODipine (NORVASC) 10 MG tablet Take 1 tablet (10 mg total) by mouth daily. 90 tablet 2  . aspirin 81 MG tablet Take 81 mg by mouth daily.      . bimatoprost (LUMIGAN) 0.01 % SOLN Place 1 drop into both eyes at bedtime.    . carvedilol (COREG) 3.125 MG tablet TAKE ONE TABLET BY MOUTH TWICE DAILY 60 tablet 6  . hydroxypropyl methylcellulose /  hypromellose (ISOPTO TEARS / GONIOVISC) 2.5 % ophthalmic solution Place 1 drop into both eyes 3 (three) times daily as needed for dry eyes.    Marland Kitchen irbesartan (AVAPRO) 300 MG tablet Take 1 tablet (300 mg total) by mouth daily. 90 tablet 3  . lansoprazole (PREVACID) 15 MG capsule Take 15 mg by mouth daily at 12 noon.     . metFORMIN (GLUCOPHAGE) 500 MG tablet Take 500 mg by mouth daily with breakfast.    . Polysacchar Iron-FA-B12 (FERREX 150 FORTE) 150-1-25 MG-MG-MCG CAPS Take 1 tablet by mouth daily. 30 capsule 6  . rosuvastatin (CRESTOR) 20 MG tablet TAKE ONE TABLET BY MOUTH ONCE DAILY 30 tablet 3  . rosuvastatin (CRESTOR) 20 MG tablet Take 1 tablet (20 mg total) by mouth daily. 90 tablet 3   No current facility-administered medications for this visit.      Past Surgical History:  Procedure Laterality Date  . COLONOSCOPY  2006   Dr. Collene Mares: normal  . COLONOSCOPY  2000   Dr. Everlean Cherry: normal   . COLONOSCOPY N/A 03/03/2016   Procedure: COLONOSCOPY;  Surgeon: Danie Binder, MD;  [facility];  Service: Endoscopy;  Laterality: N/A;  . DILATION AND CURETTAGE OF UTERUS    . ESOPHAGOGASTRODUODENOSCOPY  2000   Dr. Everlean Cherry: gastritis   .  ESOPHAGOGASTRODUODENOSCOPY  2006   Dr. Collene Mares: small hiatal hernia, gastritis, negative H.pylori   . ESOPHAGOGASTRODUODENOSCOPY N/A 03/03/2016   Procedure: ESOPHAGOGASTRODUODENOSCOPY (EGD);  Surgeon: Danie Binder, MD;  [facility];  Service: Endoscopy;  Laterality: N/A;  . ESOPHAGOGASTRODUODENOSCOPY N/A 06/03/2016   Procedure: ESOPHAGOGASTRODUODENOSCOPY (EGD);  Surgeon: Danie Binder, MD;  [facility]: [facility];  Service: Endoscopy;  Laterality: N/A;  130   . INGUINAL HERNIA REPAIR  1970s   Right  . VAGINAL HYSTERECTOMY  1980   Unilateral oophorectomy     Allergies  Allergen Reactions  . Protonix [Pantoprazole Sodium]     DRY LIPS  . Shellfish Allergy     Unknown reaction      Family History  Problem Relation Age of Onset  .  Diabetes Sister   . Heart disease Mother     also hypertension and asthma  . Cancer Mother   . Colon cancer Neg Hx   . Colon polyps Neg Hx      Social History Ms. Warbington reports that she quit smoking about 36 years ago. Her smoking use included Cigarettes. She started smoking about 51 years ago. She has a 18.00 pack-year smoking history. She has never used smokeless tobacco. Ms. Heaslip reports that she does not drink alcohol.   Review of Systems CONSTITUTIONAL: No weight loss, fever, chills, weakness or fatigue.  HEENT: Eyes: No visual loss, blurred vision, double vision or yellow sclerae.No hearing loss, sneezing, congestion, runny nose or sore throat.  SKIN: No rash or itching.  CARDIOVASCULAR: per hpi RESPIRATORY: No shortness of breath, cough or sputum.  GASTROINTESTINAL: No anorexia, nausea, vomiting or diarrhea. No abdominal pain or blood.  GENITOURINARY: No burning on urination, no polyuria NEUROLOGICAL: No headache, dizziness, syncope, paralysis, ataxia, numbness or tingling in the extremities. No change in bowel or bladder control.  MUSCULOSKELETAL: No muscle, back pain, joint pain or stiffness.  LYMPHATICS: No enlarged nodes. No history of splenectomy.  PSYCHIATRIC: No history of depression or anxiety.  ENDOCRINOLOGIC: No reports of sweating, cold or heat intolerance. No polyuria or polydipsia.  Marland Kitchen   Physical Examination Vitals:   10/01/16 0847  BP: 138/82  Pulse: 80   Vitals:   10/01/16 0847  Weight: 170 lb (77.1 kg)  Height: 5' 2.5" (1.588 m)    Gen: resting comfortably, no acute distress HEENT: no scleral icterus, pupils equal round and reactive, no palptable cervical adenopathy,  CV: RRR, no m/r/g, no jvd Resp: Clear to auscultation bilaterally GI: abdomen is soft, non-tender, non-distended, normal bowel sounds, no hepatosplenomegaly MSK: extremities are warm, no edema.  Skin: warm, no rash Neuro:  no focal deficits Psych: appropriate  affect   Diagnostic Studies 02/2011 MPI  Low risk exercise/Lexiscan Myoview as outlined. Equivocal ST- segment changes were noted in the setting of lead motion artifact. No chest pain reported. Perfusion imaging is most consistent with breast attenuation without clear evidence of scar or ischemia. LVEF 77%.  04/06/13 Clinic EKG: sinus rhythm, nomral axis, normal intervals, non-specific ST/T changes  04/2013 Echo LVEF 09-73%, grade I diastolic dysfunction,   Jan 2017 PFTs: normal spirometry, minimal restrictive changes, severely decreased DLCO    Assessment and Plan   1. CAD - no current symptoms - continue current meds  2. HTN  - at goal, continue current meds   3. Hyperlipidemia  - , continue staitn - reqyest labs from pcp  4. Palpitations - no current symptoms.  - we will continue to monitor  Arnoldo Lenis, M.D.

## 2016-10-05 NOTE — Assessment & Plan Note (Signed)
76 y/o female with IDA secondary to chronic gi blood loss. h/o PUD, documented healing but persistent gastritis in settiing of ASA. Feeling well. Hgb normal, ferritin improved. Continue PPI while on ASA. Continue oral iron. Patient is also being managed for IDA by hematology. Due for labs next month with them. We will follow up labs as available. Return to our office in six months or sooner if needed.

## 2016-10-06 NOTE — Progress Notes (Signed)
cc'ed to pcp °

## 2016-10-09 ENCOUNTER — Other Ambulatory Visit: Payer: Self-pay

## 2016-10-09 DIAGNOSIS — H401131 Primary open-angle glaucoma, bilateral, mild stage: Secondary | ICD-10-CM | POA: Diagnosis not present

## 2016-10-09 DIAGNOSIS — E119 Type 2 diabetes mellitus without complications: Secondary | ICD-10-CM | POA: Diagnosis not present

## 2016-10-09 DIAGNOSIS — D509 Iron deficiency anemia, unspecified: Secondary | ICD-10-CM

## 2016-10-09 DIAGNOSIS — H2513 Age-related nuclear cataract, bilateral: Secondary | ICD-10-CM | POA: Diagnosis not present

## 2016-10-09 NOTE — Progress Notes (Signed)
Lab orders on file for 02/09/2017.

## 2016-10-10 ENCOUNTER — Encounter: Payer: Self-pay | Admitting: Gastroenterology

## 2016-10-10 NOTE — Progress Notes (Signed)
APPT MADE AND LETTER SENT  °

## 2016-10-17 ENCOUNTER — Telehealth: Payer: Self-pay | Admitting: Cardiology

## 2016-10-17 MED ORDER — ROSUVASTATIN CALCIUM 20 MG PO TABS: 20.0000 mg | ORAL_TABLET | Freq: Every day | ORAL | 3 refills | Status: DC

## 2016-10-17 NOTE — Telephone Encounter (Signed)
Please send in 90 day supply for Rosuvastatin to [facility] in [place]  tg

## 2016-11-25 DIAGNOSIS — K270 Acute peptic ulcer, site unspecified, with hemorrhage: Secondary | ICD-10-CM | POA: Diagnosis not present

## 2016-11-25 DIAGNOSIS — E782 Mixed hyperlipidemia: Secondary | ICD-10-CM | POA: Diagnosis not present

## 2016-11-25 DIAGNOSIS — I119 Hypertensive heart disease without heart failure: Secondary | ICD-10-CM | POA: Diagnosis not present

## 2016-11-25 DIAGNOSIS — E0800 Diabetes mellitus due to underlying condition with hyperosmolarity without nonketotic hyperglycemic-hyperosmolar coma (NKHHC): Secondary | ICD-10-CM | POA: Diagnosis not present

## 2016-11-27 ENCOUNTER — Encounter (HOSPITAL_COMMUNITY): Payer: Medicare Other | Attending: Oncology

## 2016-11-27 ENCOUNTER — Ambulatory Visit (HOSPITAL_COMMUNITY): Payer: Medicare Other

## 2016-11-27 ENCOUNTER — Encounter (HOSPITAL_COMMUNITY): Payer: Medicare Other | Attending: Oncology | Admitting: Oncology

## 2016-11-27 ENCOUNTER — Other Ambulatory Visit (HOSPITAL_COMMUNITY): Payer: Medicare Other

## 2016-11-27 ENCOUNTER — Encounter (HOSPITAL_COMMUNITY): Payer: Self-pay

## 2016-11-27 VITALS — BP 127/65 | HR 75 | Resp 16 | Ht 62.5 in | Wt 171.0 lb

## 2016-11-27 DIAGNOSIS — D500 Iron deficiency anemia secondary to blood loss (chronic): Secondary | ICD-10-CM

## 2016-11-27 DIAGNOSIS — D509 Iron deficiency anemia, unspecified: Secondary | ICD-10-CM

## 2016-11-27 LAB — COMPREHENSIVE METABOLIC PANEL
ALT: 13 U/L — ABNORMAL LOW (ref 14–54)
ANION GAP: 8 (ref 5–15)
AST: 24 U/L (ref 15–41)
Albumin: 4.1 g/dL (ref 3.5–5.0)
Alkaline Phosphatase: 74 U/L (ref 38–126)
BILIRUBIN TOTAL: 0.6 mg/dL (ref 0.3–1.2)
BUN: 10 mg/dL (ref 6–20)
CO2: 27 mmol/L (ref 22–32)
Calcium: 9.1 mg/dL (ref 8.9–10.3)
Chloride: 105 mmol/L (ref 101–111)
Creatinine, Ser: 1.06 mg/dL — ABNORMAL HIGH (ref 0.44–1.00)
GFR calc Af Amer: 58 mL/min — ABNORMAL LOW (ref >60)
GFR calc non Af Amer: 50 mL/min — ABNORMAL LOW (ref >60)
GLUCOSE: 93 mg/dL (ref 65–99)
POTASSIUM: 3.8 mmol/L (ref 3.5–5.1)
Sodium: 140 mmol/L (ref 135–145)
TOTAL PROTEIN: 7 g/dL (ref 6.5–8.1)

## 2016-11-27 LAB — CBC WITH DIFFERENTIAL/PLATELET
BASOS PCT: 0 %
Basophils Absolute: 0 10*3/uL (ref 0.0–0.1)
EOS ABS: 0.6 10*3/uL (ref 0.0–0.7)
EOS PCT: 6 %
HEMATOCRIT: 40.2 % (ref 36.0–46.0)
Hemoglobin: 12.7 g/dL (ref 12.0–15.0)
Lymphocytes Relative: 21 %
Lymphs Abs: 2 10*3/uL (ref 0.7–4.0)
MCH: 24.2 pg — ABNORMAL LOW (ref 26.0–34.0)
MCHC: 31.6 g/dL (ref 30.0–36.0)
MCV: 76.6 fL — ABNORMAL LOW (ref 78.0–100.0)
MONO ABS: 0.6 10*3/uL (ref 0.1–1.0)
MONOS PCT: 7 %
Neutro Abs: 6.1 10*3/uL (ref 1.7–7.7)
Neutrophils Relative %: 66 %
PLATELETS: 300 10*3/uL (ref 150–400)
RBC: 5.25 MIL/uL — ABNORMAL HIGH (ref 3.87–5.11)
RDW: 16.4 % — AB (ref 11.5–15.5)
WBC: 9.3 10*3/uL (ref 4.0–10.5)

## 2016-11-27 LAB — IRON AND TIBC
Iron: 55 ug/dL (ref 28–170)
SATURATION RATIOS: 15 % (ref 10.4–31.8)
TIBC: 371 ug/dL (ref 250–450)
UIBC: 316 ug/dL

## 2016-11-27 LAB — FERRITIN: Ferritin: 12 ng/mL (ref 11–307)

## 2016-11-27 NOTE — Progress Notes (Signed)
Nancy Liu, [place] [address]   CLINIC:  Medical Oncology/Hematology  PCP:  Nancy Lei, MD [address]  [place] [address] [phone]   REASON FOR VISIT:  Follow-up for microcytic anemia   CURRENT THERAPY: Initial work-up   HISTORY OF PRESENT ILLNESS:  (From Dr. Laverle Liu visit on 07/23/16)     INTERVAL HISTORY:  Nancy Liu is 76 y.o. female who returns today for microcytic anemia and to review her laboratory studies. Anemia workup had shown an iron deficiency and a thalassemia minor trait on hemoglobin electrophoresis, but was otherwise negative.  Today patient presents for follow-up. She's been taking her once daily iron as prescribed without any complications. Her hemoglobin is 12.7 g/dL which is stable from 2 months ago. She complains of dry skin in the area above her upper lip which she states started after she started taking Protonix. Otherwise she has no complaints today.     REVIEW OF SYSTEMS:  Review of Systems  Constitutional: Negative.  Negative for appetite change, chills, fatigue and fever.  HENT:  Negative.  Negative for hearing loss, lump/mass, mouth sores, nosebleeds, sore throat and tinnitus.        Dry skin on her face above her upper lip  Eyes: Negative.  Negative for eye problems and icterus.  Respiratory: Negative.  Negative for chest tightness, cough, hemoptysis, shortness of breath and wheezing.   Cardiovascular: Negative.  Negative for chest pain, leg swelling and palpitations.  Gastrointestinal: Negative.  Negative for abdominal distention, abdominal pain, blood in stool, constipation, diarrhea, nausea and vomiting.  Endocrine: Negative.  Negative for hot flashes.  Genitourinary: Negative.  Negative for difficulty urinating, dysuria, frequency and hematuria.   Musculoskeletal: Negative.  Negative for arthralgias and neck pain.  Skin: Negative.  Negative for itching and rash.  Neurological: Negative.   Negative for dizziness, headaches and speech difficulty.  Hematological: Negative.  Negative for adenopathy. Does not bruise/bleed easily.  Psychiatric/Behavioral: Negative.  Negative for confusion, depression and sleep disturbance. The patient is not nervous/anxious.      PAST MEDICAL/SURGICAL HISTORY:  Past Medical History:  Diagnosis Date  . Arteriosclerotic cardiovascular disease (ASCVD) 2001   Bare-metal stent placed in the right coronary artery in 12/01; residual 50% lesion of the first diagonal and mid LAD  . Cerebrovascular disease   . Diabetes mellitus    excellent control with a low-dose of a single oral agent  . GERD (gastroesophageal reflux disease)   . History of right coronary artery stent placement 2000  . Hyperlipidemia   . Hypertension   . Tobacco abuse, in remission    35 pack years; Quit in 1980   Past Surgical History:  Procedure Laterality Date  . COLONOSCOPY  2006   Dr. Collene Liu: normal  . COLONOSCOPY  2000   Dr. Everlean Liu: normal   . COLONOSCOPY N/A 03/03/2016   Procedure: COLONOSCOPY;  Surgeon: Nancy Binder, MD;  [facility];  Service: Endoscopy;  Laterality: N/A;  . DILATION AND CURETTAGE OF UTERUS    . ESOPHAGOGASTRODUODENOSCOPY  2000   Dr. Everlean Liu: gastritis   . ESOPHAGOGASTRODUODENOSCOPY  2006   Dr. Collene Liu: small hiatal hernia, gastritis, negative H.pylori   . ESOPHAGOGASTRODUODENOSCOPY N/A 03/03/2016   Procedure: ESOPHAGOGASTRODUODENOSCOPY (EGD);  Surgeon: Nancy Binder, MD;  [facility];  Service: Endoscopy;  Laterality: N/A;  . ESOPHAGOGASTRODUODENOSCOPY N/A 06/03/2016   Procedure: ESOPHAGOGASTRODUODENOSCOPY (EGD);  Surgeon: Nancy Binder, MD;  [facility];  Service: Endoscopy;  Laterality: N/A;  130   . INGUINAL HERNIA REPAIR  1970s   Right  . VAGINAL HYSTERECTOMY  1980   Unilateral oophorectomy     SOCIAL HISTORY:  Social History   Social History  . Marital status: Divorced    Spouse name: N/A  . Number of  children: 3  . Years of education: N/A   Occupational History  . Retired     Environmental manager work   Social History Main Topics  . Smoking status: Former Smoker    Packs/day: 1.00    Years: 18.00    Types: Cigarettes    Start date: 06/02/1965    Quit date: 06/09/1980  . Smokeless tobacco: Never Used  . Alcohol use No  . Drug use: No  . Sexual activity: No   Other Topics Concern  . Not on file   Social History Narrative  . No narrative on file    FAMILY HISTORY:  Family History  Problem Relation Age of Onset  . Diabetes Sister   . Heart disease Mother        also hypertension and asthma  . Cancer Mother   . Colon cancer Neg Hx   . Colon polyps Neg Hx     CURRENT MEDICATIONS:  Outpatient Encounter Prescriptions as of 11/27/2016  Medication Sig  . amLODipine (NORVASC) 10 MG tablet Take 1 tablet (10 mg total) by mouth daily.  Marland Kitchen aspirin 81 MG tablet Take 81 mg by mouth daily.    . bimatoprost (LUMIGAN) 0.01 % SOLN Place 1 drop into both eyes at bedtime.  . carvedilol (COREG) 6.25 MG tablet Take 1 tablet (6.25 mg total) by mouth 2 (two) times daily. (Patient taking differently: Take 6.25 mg by mouth 2 (two) times daily with a meal. 2 in the am and 2 in the pm)  . hydroxypropyl methylcellulose / hypromellose (ISOPTO TEARS / GONIOVISC) 2.5 % ophthalmic solution Place 1 drop into both eyes 3 (three) times daily as needed for dry eyes.  Marland Kitchen irbesartan (AVAPRO) 300 MG tablet Take 1 tablet (300 mg total) by mouth daily.  . lansoprazole (PREVACID) 15 MG capsule Take 15 mg by mouth daily at 12 noon.   . metFORMIN (GLUCOPHAGE) 500 MG tablet Take 500 mg by mouth daily with breakfast.  . Polysacchar Iron-FA-B12 (FERREX 150 FORTE) 150-1-25 MG-MG-MCG CAPS Take 1 tablet by mouth daily.  . rosuvastatin (CRESTOR) 20 MG tablet TAKE ONE TABLET BY MOUTH ONCE DAILY  . rosuvastatin (CRESTOR) 20 MG tablet Take 1 tablet (20 mg total) by mouth daily.   No facility-administered encounter medications on file as  of 11/27/2016.     ALLERGIES:  Allergies  Allergen Reactions  . Protonix [Pantoprazole Sodium]     DRY LIPS  . Shellfish Allergy     Unknown reaction     PHYSICAL EXAM:  ECOG Performance status: 0 - Asymptomatic, independent   Vitals:   11/27/16 1353  BP: 127/65  Pulse: 75  Resp: 16   Filed Weights   11/27/16 1353  Weight: 171 lb (77.6 kg)    Physical Exam  Constitutional: She is oriented to person, place, and time and well-developed, well-nourished, and in no distress.  HENT:  Head: Normocephalic.  Mouth/Throat: Oropharynx is clear and moist. No oropharyngeal exudate.  Eyes: Conjunctivae are normal. Pupils are equal, round, and reactive to light. No scleral icterus.  Neck: Normal range of motion. Neck supple.  Cardiovascular: Normal rate, regular rhythm and normal heart sounds.  Exam reveals no gallop.   No murmur heard. Pulmonary/Chest: Effort normal and breath sounds normal. No respiratory distress.  Abdominal: Soft. Bowel sounds are normal. There is no tenderness.  Musculoskeletal: Normal range of motion. She exhibits no edema.  Lymphadenopathy:    She has no cervical adenopathy.       Right: No supraclavicular adenopathy present.       Left: No supraclavicular adenopathy present.  Neurological: She is alert and oriented to person, place, and time. No cranial nerve deficit. Gait normal.  Skin: Skin is warm and dry. No rash noted.  Psychiatric: Mood, memory, affect and judgment normal.  Nursing note and vitals reviewed.    LABORATORY DATA:  I have reviewed the labs as listed.  CBC    Component Value Date/Time   WBC 9.3 11/27/2016 1321   RBC 5.25 (H) 11/27/2016 1321   HGB 12.7 11/27/2016 1321   HCT 40.2 11/27/2016 1321   PLT 300 11/27/2016 1321   MCV 76.6 (L) 11/27/2016 1321   MCH 24.2 (L) 11/27/2016 1321   MCHC 31.6 11/27/2016 1321   RDW 16.4 (H) 11/27/2016 1321   LYMPHSABS 2.0 11/27/2016 1321   MONOABS 0.6 11/27/2016 1321   EOSABS 0.6 11/27/2016  1321   BASOSABS 0.0 11/27/2016 1321   CMP Latest Ref Rng & Units 11/27/2016 07/23/2016 02/29/2016  Glucose 65 - 99 mg/dL 93 96 89  BUN 6 - 20 mg/dL 10 15 11   Creatinine 0.44 - 1.00 mg/dL 1.06(H) 0.77 0.83  Sodium 135 - 145 mmol/L 140 140 135  Potassium 3.5 - 5.1 mmol/L 3.8 4.2 3.4(L)  Chloride 101 - 111 mmol/L 105 104 104  CO2 22 - 32 mmol/L 27 28 25   Calcium 8.9 - 10.3 mg/dL 9.1 9.2 8.7(L)  Total Protein 6.5 - 8.1 g/dL 7.0 7.1 6.8  Total Bilirubin 0.3 - 1.2 mg/dL 0.6 0.4 0.2(L)  Alkaline Phos 38 - 126 U/L 74 77 73  AST 15 - 41 U/L 24 23 17   ALT 14 - 54 U/L 13(L) 17 11(L)   Results for JESSIE, COWHER (MRN [id number])   Ref. Range 07/23/2016 14:53  Iron Latest Ref Range: 28 - 170 ug/dL 57  UIBC Latest Units: ug/dL 310  TIBC Latest Ref Range: 250 - 450 ug/dL 367  Saturation Ratios Latest Ref Range: 10.4 - 31.8 % 16  Ferritin Latest Ref Range: 11 - 307 ng/mL 14  Folate Latest Ref Range: >5.9 ng/mL 19.1   Results for NOELI, LAVERY (MRN [id number])   Ref. Range 07/23/2016 14:53  Copper Latest Ref Range: 72 - 166 ug/dL 143  Vitamin B12 Latest Ref Range: 180 - 914 pg/mL 251   Results for FRANCHESCA, VENEZIANO (MRN [id number])   Ref. Range 07/23/2016 14:54  Total Protein ELP Latest Ref Range: 6.0 - 8.5 g/dL 6.7  Albumin ELP Latest Ref Range: 2.9 - 4.4 g/dL 3.7  Globulin, Total Latest Ref Range: 2.2 - 3.9  3.0  A/G Ratio Latest Ref Range: 0.7 - 1.7  1.2  Alpha-1-Globulin Latest Ref Range: 0.0 - 0.4 g/dL 0.2  Alpha-2-Globulin Latest Ref Range: 0.4 - 1.0 g/dL 0.9  Beta Globulin Latest Ref Range: 0.7 - 1.3 g/dL 1.0  Gamma Globulin Latest Ref Range: 0.4 - 1.8 g/dL 0.8  M-SPIKE, % Latest Ref Range: Not Observed g/dL Not Observed  SPE Interp. Unknown Comment  Comment Unknown Comment   Results for HYDIE, LANGAN (MRN [id number])   Ref. Range 07/23/2016 14:53  Hgb A Latest Ref Range: 96.4 -  98.8 % 98.1  Hgb A2 Quant Latest Ref Range: 1.8 - 3.2 % 0.9 (L)  Hgb F Quant Latest Ref Range:  0.0 - 2.0 % 0.0  Hgb S Quant Latest Ref Range: 0.0 % 0.0  HGB C Latest Ref Range: 0.0 % 0.0  HGB VARIANT Latest Ref Range: 0.0 % 1.0 (H)  Please Note: Unknown Comment     Results for PERRIE, RAGIN (MRN [id number])  Ref. Range 07/23/2016 14:54  IgG (Immunoglobin G), Serum Latest Ref Range: 700 - 1,600 mg/dL 743  IgA Latest Ref Range: 64 - 422 mg/dL 168     Results for GAYLENE, MOYLAN (MRN [id number])  Ref. Range 07/23/2016 14:54  Alpha-1-Globulin Latest Ref Range: 0.0 - 0.4 g/dL 0.2  Alpha-2-Globulin Latest Ref Range: 0.4 - 1.0 g/dL 0.9      PENDING LABS:    DIAGNOSTIC IMAGING:    PATHOLOGY:      ASSESSMENT & PLAN:   Microcytic anemia:  -Secondary to thalassemia minor given hemoglobin variant. No M-spike or gammopathy. Patient is currently not anemic. -Very mild iron deficiency anemia with ferritin 14. Continue Ferrex Forte once daily. Her iron studies are pending at this time.-Return to cancer center in 4 months with labs for continued follow-up.   Dry Skin above upper lip: -Recommended for patient to try aquaphor.      Dispo:  -Return to cancer center in 6 months with labs for continued follow-up.    All questions were answered to patient's stated satisfaction. Encouraged patient to call with any new concerns or questions before her next visit to the cancer center and we can certain see her sooner, if needed.    Twana First, MD

## 2016-11-28 ENCOUNTER — Other Ambulatory Visit (HOSPITAL_COMMUNITY): Payer: Self-pay | Admitting: Adult Health

## 2016-12-08 ENCOUNTER — Encounter (HOSPITAL_COMMUNITY): Payer: Self-pay

## 2016-12-08 ENCOUNTER — Encounter (HOSPITAL_COMMUNITY): Payer: Medicare Other | Attending: Oncology

## 2016-12-08 VITALS — BP 120/66 | HR 66 | Temp 97.6°F | Resp 18

## 2016-12-08 DIAGNOSIS — D509 Iron deficiency anemia, unspecified: Secondary | ICD-10-CM | POA: Diagnosis not present

## 2016-12-08 DIAGNOSIS — D500 Iron deficiency anemia secondary to blood loss (chronic): Secondary | ICD-10-CM

## 2016-12-08 MED ORDER — SODIUM CHLORIDE 0.9% FLUSH
3.0000 mL | Freq: Once | INTRAVENOUS | Status: AC | PRN
  Administered 2016-12-08: 3 mL via INTRAVENOUS

## 2016-12-08 MED ORDER — FERUMOXYTOL INJECTION 510 MG/17 ML
510.0000 mg | Freq: Once | INTRAVENOUS | Status: AC
  Administered 2016-12-08: 510 mg via INTRAVENOUS
  Filled 2016-12-08: qty 17

## 2016-12-08 MED ORDER — SODIUM CHLORIDE 0.9 % IV SOLN
Freq: Once | INTRAVENOUS | Status: AC
  Administered 2016-12-08: 12:00:00 via INTRAVENOUS

## 2016-12-08 NOTE — Patient Instructions (Signed)
[facility] at [facility] Discharge Instructions  RECOMMENDATIONS MADE BY THE CONSULTANT AND ANY TEST RESULTS WILL BE SENT TO YOUR REFERRING PHYSICIAN.  Received Feraheme infusion today.Follow-up as scheduled. Call clinic for any questions or concerns  Thank you for choosing [facility] at [facility] to provide your oncology and hematology care.  To afford each patient quality time with our provider, please arrive at least 15 minutes before your scheduled appointment time.    If you have a lab appointment with the Cancer Center please come in thru the  Main Entrance and check in at the main information desk  You need to re-schedule your appointment should you arrive 10 or more minutes late.  We strive to give you quality time with our providers, and arriving late affects you and other patients whose appointments are after yours.  Also, if you no show three or more times for appointments you may be dismissed from the clinic at the providers discretion.     Again, thank you for choosing [facility].  Our hope is that these requests will decrease the amount of time that you wait before being seen by our physicians.       _____________________________________________________________  Should you have questions after your visit to [facility], please contact our office at [phone] between the hours of 8:30 a.m. and 4:30 p.m.  Voicemails left after 4:30 p.m. will not be returned until the following business day.  For prescription refill requests, have your pharmacy contact our office.       Resources For Cancer Patients and their Caregivers ? American Cancer Society: Can assist with transportation, wigs, general needs, runs Look Good Feel Better.        1-888-227-6333 ? Cancer Care: Provides financial assistance, online support groups, medication/co-pay assistance.  1-800-813-HOPE (4673) ? [facility] Assists [place] cancer patients and their families through emotional , educational and financial support.  336-427-4357 ? [place] DSS Where to apply for food stamps, Medicaid and utility assistance. 336-342-1394 ? [facility]: Transportation to medical appointments. 336-347-2287 ? Social Security Administration: May apply for disability if have a Stage IV cancer. 336-342-7796 1-800-772-1213 ? [place] Aging, Disability and Transit Services: Assists with nutrition, care and transit needs. 336-349-2343  Cancer Center Support Programs: [email or web address]@ > Cancer Support Group  2nd Tuesday of the month 1pm-2pm, Journey Room  > Creative Journey  3rd Tuesday of the month 1130am-1pm, Journey Room  > Look Good Feel Better  1st Wednesday of the month 10am-12 noon, Journey Room (Call American Cancer Society to register 1-800-395-5775)   

## 2016-12-08 NOTE — Progress Notes (Signed)
Nancy Liu tolerated Feraheme infusion well without complaints or incident. VSS upon discharge. Pt discharged self ambulatory in satisfactory condition

## 2016-12-30 ENCOUNTER — Other Ambulatory Visit: Payer: Self-pay

## 2016-12-30 DIAGNOSIS — D509 Iron deficiency anemia, unspecified: Secondary | ICD-10-CM

## 2017-01-13 DIAGNOSIS — H401131 Primary open-angle glaucoma, bilateral, mild stage: Secondary | ICD-10-CM | POA: Diagnosis not present

## 2017-01-16 NOTE — Progress Notes (Signed)
REVIEWED-NO ADDITIONAL RECOMMENDATIONS. 

## 2017-01-19 ENCOUNTER — Other Ambulatory Visit: Payer: Self-pay | Admitting: Cardiology

## 2017-01-27 DIAGNOSIS — H409 Unspecified glaucoma: Secondary | ICD-10-CM | POA: Diagnosis not present

## 2017-01-27 DIAGNOSIS — E782 Mixed hyperlipidemia: Secondary | ICD-10-CM | POA: Diagnosis not present

## 2017-01-27 DIAGNOSIS — E119 Type 2 diabetes mellitus without complications: Secondary | ICD-10-CM | POA: Diagnosis not present

## 2017-01-27 DIAGNOSIS — I119 Hypertensive heart disease without heart failure: Secondary | ICD-10-CM | POA: Diagnosis not present

## 2017-01-28 ENCOUNTER — Ambulatory Visit (INDEPENDENT_AMBULATORY_CARE_PROVIDER_SITE_OTHER): Payer: Medicare Other | Admitting: Cardiology

## 2017-01-28 ENCOUNTER — Encounter: Payer: Self-pay | Admitting: Cardiology

## 2017-01-28 VITALS — BP 122/70 | HR 79 | Ht 62.5 in | Wt 166.8 lb

## 2017-01-28 DIAGNOSIS — R002 Palpitations: Secondary | ICD-10-CM

## 2017-01-28 DIAGNOSIS — I2510 Atherosclerotic heart disease of native coronary artery without angina pectoris: Secondary | ICD-10-CM

## 2017-01-28 DIAGNOSIS — R600 Localized edema: Secondary | ICD-10-CM | POA: Diagnosis not present

## 2017-01-28 DIAGNOSIS — E782 Mixed hyperlipidemia: Secondary | ICD-10-CM

## 2017-01-28 DIAGNOSIS — I10 Essential (primary) hypertension: Secondary | ICD-10-CM

## 2017-01-28 DIAGNOSIS — D509 Iron deficiency anemia, unspecified: Secondary | ICD-10-CM | POA: Diagnosis not present

## 2017-01-28 LAB — CBC WITH DIFFERENTIAL/PLATELET
BASOS ABS: 0 {cells}/uL (ref 0–200)
Basophils Relative: 0 %
EOS PCT: 7 %
Eosinophils Absolute: 504 cells/uL — ABNORMAL HIGH (ref 15–500)
HEMATOCRIT: 40 % (ref 35.0–45.0)
HEMOGLOBIN: 12.9 g/dL (ref 11.7–15.5)
LYMPHS ABS: 1080 {cells}/uL (ref 850–3900)
Lymphocytes Relative: 15 %
MCH: 24.6 pg — ABNORMAL LOW (ref 27.0–33.0)
MCHC: 32.3 g/dL (ref 32.0–36.0)
MCV: 76.2 fL — ABNORMAL LOW (ref 80.0–100.0)
MPV: 10.3 fL (ref 7.5–12.5)
Monocytes Absolute: 576 cells/uL (ref 200–950)
Monocytes Relative: 8 %
NEUTROS PCT: 70 %
Neutro Abs: 5040 cells/uL (ref 1500–7800)
Platelets: 275 10*3/uL (ref 140–400)
RBC: 5.25 MIL/uL — ABNORMAL HIGH (ref 3.80–5.10)
RDW: 15.9 % — ABNORMAL HIGH (ref 11.0–15.0)
WBC: 7.2 10*3/uL (ref 3.8–10.8)

## 2017-01-28 NOTE — Patient Instructions (Signed)
Medication Instructions:  Your physician recommends that you continue on your current medications as directed. Please refer to the Current Medication list given to you today.  YOU HAVE BEEN GIVEN A PRESCRIPTION FOR COMPRESSION STOCKINGS.   Labwork: NONE  Testing/Procedures: NONE  Follow-Up: Your physician wants you to follow-up in: 6 MONTHS .  You will receive a reminder letter in the mail two months in advance. If you don't receive a letter, please call our office to schedule the follow-up appointment.   Any Other Special Instructions Will Be Listed Below (If Applicable).     If you need a refill on your cardiac medications before your next appointment, please call your pharmacy.

## 2017-01-28 NOTE — Progress Notes (Signed)
Clinical Summary Ms. Brandes is a 76 y.o.female seen today for follow up of the following medical problems.   1. CAD  - prior BMS to RCA in 2001 at [facility]  - 02/2011 MPI no ischemia  - echo 04/2013 showed LVEF 60-65% with grade I diastolic dysfunction.   - denies any chest pain. No SOB or DOE - compliant with meds  2. HTN  - she is compliant with meds   3. Hyperlpidiemia  - compliant with statin  - 05/2016 TC 134 TG 100 HDL 40 LDL 75  4. Palpitations  - denies any recent symptoms   5. SOB/Abnormal PFTs - former smoker x 30-35 years. - PFTs Jan 2017 with severely redcued DLCO, referred to Dr Luan Pulling.  - followed by Dr Luan Pulling  6. Anemia -followed by Dr Talbert Cage hematology  7. LE edema - mainly occurs when standing for long periods of time.   SH: has 3 children. 2 grandchilren, one is a Marine scientist working at childrens hospital in [place]. Doristine Devoid grandbaby is on the way in November.    Past Medical History:  Diagnosis Date  . Arteriosclerotic cardiovascular disease (ASCVD) 2001   Bare-metal stent placed in the right coronary artery in 12/01; residual 50% lesion of the first diagonal and mid LAD  . Cerebrovascular disease   . Diabetes mellitus    excellent control with a low-dose of a single oral agent  . GERD (gastroesophageal reflux disease)   . History of right coronary artery stent placement 2000  . Hyperlipidemia   . Hypertension   . Tobacco abuse, in remission    35 pack years; Quit in 1980     Allergies  Allergen Reactions  . Protonix [Pantoprazole Sodium]     DRY LIPS  . Shellfish Allergy     Unknown reaction     Current Outpatient Prescriptions  Medication Sig Dispense Refill  . amLODipine (NORVASC) 10 MG tablet Take 1 tablet (10 mg total) by mouth daily. 90 tablet 2  . aspirin 81 MG tablet Take 81 mg by mouth daily.      . bimatoprost (LUMIGAN) 0.01 % SOLN Place 1 drop into both eyes at bedtime.    . carvedilol  (COREG) 6.25 MG tablet Take 1 tablet (6.25 mg total) by mouth 2 (two) times daily. (Patient taking differently: Take 6.25 mg by mouth 2 (two) times daily with a meal. 2 in the am and 2 in the pm) 180 tablet 3  . hydroxypropyl methylcellulose / hypromellose (ISOPTO TEARS / GONIOVISC) 2.5 % ophthalmic solution Place 1 drop into both eyes 3 (three) times daily as needed for dry eyes.    Marland Kitchen irbesartan (AVAPRO) 300 MG tablet TAKE ONE TABLET BY MOUTH ONCE DAILY 90 tablet 3  . lansoprazole (PREVACID) 15 MG capsule Take 15 mg by mouth daily at 12 noon.     . metFORMIN (GLUCOPHAGE) 500 MG tablet Take 500 mg by mouth daily with breakfast.    . Polysacchar Iron-FA-B12 (FERREX 150 FORTE) 150-1-25 MG-MG-MCG CAPS Take 1 tablet by mouth daily. 30 capsule 6  . rosuvastatin (CRESTOR) 20 MG tablet TAKE ONE TABLET BY MOUTH ONCE DAILY 30 tablet 3  . rosuvastatin (CRESTOR) 20 MG tablet Take 1 tablet (20 mg total) by mouth daily. 90 tablet 3   No current facility-administered medications for this visit.      Past Surgical History:  Procedure Laterality Date  . COLONOSCOPY  2006   Dr. Collene Mares: normal  . COLONOSCOPY  2000   Dr. Everlean Cherry: normal   . COLONOSCOPY N/A 03/03/2016   Procedure: COLONOSCOPY;  Surgeon: Danie Binder, MD;  [facility];  Service: Endoscopy;  Laterality: N/A;  . DILATION AND CURETTAGE OF UTERUS    . ESOPHAGOGASTRODUODENOSCOPY  2000   Dr. Everlean Cherry: gastritis   . ESOPHAGOGASTRODUODENOSCOPY  2006   Dr. Collene Mares: small hiatal hernia, gastritis, negative H.pylori   . ESOPHAGOGASTRODUODENOSCOPY N/A 03/03/2016   Procedure: ESOPHAGOGASTRODUODENOSCOPY (EGD);  Surgeon: Danie Binder, MD;  Location: [facility];  Service: Endoscopy;  Laterality: N/A;  . ESOPHAGOGASTRODUODENOSCOPY N/A 06/03/2016   Procedure: ESOPHAGOGASTRODUODENOSCOPY (EGD);  Surgeon: Danie Binder, MD;  Location: [facility];  Service: Endoscopy;  Laterality: N/A;  130   . INGUINAL HERNIA REPAIR  1970s   Right  . VAGINAL  HYSTERECTOMY  1980   Unilateral oophorectomy     Allergies  Allergen Reactions  . Protonix [Pantoprazole Sodium]     DRY LIPS  . Shellfish Allergy     Unknown reaction      Family History  Problem Relation Age of Onset  . Diabetes Sister   . Heart disease Mother        also hypertension and asthma  . Cancer Mother   . Colon cancer Neg Hx   . Colon polyps Neg Hx      Social History Ms. Sonier reports that she quit smoking about 36 years ago. Her smoking use included Cigarettes. She started smoking about 51 years ago. She has a 18.00 pack-year smoking history. She has never used smokeless tobacco. Ms. Deschepper reports that she does not drink alcohol.   Review of Systems CONSTITUTIONAL: No weight loss, fever, chills, weakness or fatigue.  HEENT: Eyes: No visual loss, blurred vision, double vision or yellow sclerae.No hearing loss, sneezing, congestion, runny nose or sore throat.  SKIN: No rash or itching.  CARDIOVASCULAR: per hpi RESPIRATORY: per hpi GASTROINTESTINAL: No anorexia, nausea, vomiting or diarrhea. No abdominal pain or blood.  GENITOURINARY: No burning on urination, no polyuria NEUROLOGICAL: No headache, dizziness, syncope, paralysis, ataxia, numbness or tingling in the extremities. No change in bowel or bladder control.  MUSCULOSKELETAL: No muscle, back pain, joint pain or stiffness.  LYMPHATICS: No enlarged nodes. No history of splenectomy.  PSYCHIATRIC: No history of depression or anxiety.  ENDOCRINOLOGIC: No reports of sweating, cold or heat intolerance. No polyuria or polydipsia.  Marland Kitchen   Physical Examination Vitals:   01/28/17 0837  BP: 122/70  Pulse: 79  SpO2: 97%   Vitals:   01/28/17 0837  Weight: 166 lb 12.8 oz (75.7 kg)  Height: 5' 2.5" (1.588 m)    Gen: resting comfortably, no acute distress HEENT: no scleral icterus, pupils equal round and reactive, no palptable cervical adenopathy,  CV: RRR, no m/r/g, no jvd Resp: Clear to auscultation  bilaterally GI: abdomen is soft, non-tender, non-distended, normal bowel sounds, no hepatosplenomegaly MSK: extremities are warm, no edema.  Skin: warm, no rash Neuro:  no focal deficits Psych: appropriate affect   Diagnostic Studies 02/2011 MPI Low risk exercise/Lexiscan Myoview as outlined. Equivocal ST- segment changes were noted in the setting of lead motion artifact. No chest pain reported. Perfusion imaging is most consistent with breast attenuation without clear evidence of scar or ischemia. LVEF 77%.  04/06/13 Clinic EKG: sinus rhythm, nomral axis, normal intervals, non-specific ST/T changes  04/2013 Echo LVEF 41-93%, grade I diastolic dysfunction,   Jan 2017 PFTs: normal spirometry, minimal restrictive changes, severely decreased DLCO  Assessment and Plan    1. CAD - no recent symptoms -she will  continue current meds  2. HTN  - bp is at goal, she will continue current meds   3. Hyperlipidemia  - reqyest labs from pcp, she will continue current statin  4. Palpitations - no recent symptoms, continue to follow clinically  5. LE edema - mild and infrequentcontinue to monitor at this time. If progresses consider diuretic.      Arnoldo Lenis, M.D., F.A.C.C.

## 2017-01-29 LAB — FERRITIN: Ferritin: 99 ng/mL (ref 20–288)

## 2017-01-29 NOTE — Progress Notes (Signed)
LMOM that ferritin is normal and to call if she has questions.

## 2017-01-29 NOTE — Progress Notes (Signed)
PT is aware.

## 2017-02-10 ENCOUNTER — Encounter: Payer: Self-pay | Admitting: Gastroenterology

## 2017-02-10 ENCOUNTER — Ambulatory Visit (INDEPENDENT_AMBULATORY_CARE_PROVIDER_SITE_OTHER): Payer: Medicare Other | Admitting: Gastroenterology

## 2017-02-10 VITALS — BP 135/80 | HR 71 | Temp 97.5°F | Ht 62.5 in | Wt 166.4 lb

## 2017-02-10 DIAGNOSIS — K558 Other vascular disorders of intestine: Secondary | ICD-10-CM

## 2017-02-10 DIAGNOSIS — D500 Iron deficiency anemia secondary to blood loss (chronic): Secondary | ICD-10-CM | POA: Diagnosis not present

## 2017-02-10 DIAGNOSIS — K2970 Gastritis, unspecified, without bleeding: Secondary | ICD-10-CM

## 2017-02-10 DIAGNOSIS — K5900 Constipation, unspecified: Secondary | ICD-10-CM | POA: Diagnosis not present

## 2017-02-10 DIAGNOSIS — K5520 Angiodysplasia of colon without hemorrhage: Secondary | ICD-10-CM

## 2017-02-10 DIAGNOSIS — K2990 Gastroduodenitis, unspecified, without bleeding: Secondary | ICD-10-CM | POA: Diagnosis not present

## 2017-02-10 MED ORDER — POLYETHYLENE GLYCOL 3350 17 GM/SCOOP PO POWD: ORAL | 0 refills | Status: DC

## 2017-02-10 MED ORDER — LANSOPRAZOLE 30 MG PO CPDR: 30.0000 mg | DELAYED_RELEASE_CAPSULE | Freq: Every day | ORAL | 11 refills | Status: DC

## 2017-02-10 NOTE — Patient Instructions (Signed)
1. Restart lansoprazole 30mg  daily before breakfast. RX sent to pharmacy. 2. Take miralax one packet (17 grams) at bedtime on days you do not have a good bowel movement. If too strong, you can try taking every other day or take 1/2 packet daily. 3. Return to the office in six months.  4. Keep follow up appointment with hematology (blood doctor) for your anemia in 06/2017.

## 2017-02-10 NOTE — Progress Notes (Signed)
Primary Care Physician: Lucianne Lei, MD  Primary Gastroenterologist:  Barney Drain, MD   Chief Complaint  Patient presents with  . Anemia  . Diarrhea    PCP thinks it may be from Metformin    HPI: Nancy Liu is a 76 y.o. female here for f/u IDA. Last seen in 09/2016. 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 Diverticulosis and nonbleeding hemorrhoids. Repeat EGD 06/2016 showed erosive gastritis but ulcers had headed.   At some point she came off lansoprazole. Previously recommended to stay on indefinitely to prevent ulcers given chronic aspirin use. Clinically she feels well. No abdominal pain. Heartburn well-controlled. No melena rectal bleeding. Difficulty with bowels. May go for 5 days without a bowel movement, then may have the squirts. Has tried MiraLAX couple times but not really consistently. Has been on metformin for years. PCP started her on digestive advantage to see if this would help.  Patient received one dose of IV iron 11/2016.   Recent ferritin and CBC good. See below.  Current Outpatient Prescriptions  Medication Sig Dispense Refill  . amLODipine (NORVASC) 10 MG tablet Take 1 tablet (10 mg total) by mouth daily. 90 tablet 2  . aspirin 81 MG tablet Take 81 mg by mouth daily.      . bimatoprost (LUMIGAN) 0.01 % SOLN Place 1 drop into both eyes at bedtime.    . carvedilol (COREG) 6.25 MG tablet Take 6.25 mg by mouth 2 (two) times daily.    . hydroxypropyl methylcellulose / hypromellose (ISOPTO TEARS / GONIOVISC) 2.5 % ophthalmic solution Place 1 drop into both eyes 3 (three) times daily as needed for dry eyes.    Marland Kitchen irbesartan (AVAPRO) 300 MG tablet TAKE ONE TABLET BY MOUTH ONCE DAILY 90 tablet 3  . metFORMIN (GLUCOPHAGE) 500 MG tablet Take 500 mg by mouth daily with breakfast.    . Polysacchar Iron-FA-B12 (FERREX 150 FORTE) 150-1-25 MG-MG-MCG  CAPS Take 1 tablet by mouth daily. 30 capsule 6  . rosuvastatin (CRESTOR) 20 MG tablet TAKE ONE TABLET BY MOUTH ONCE DAILY 30 tablet 3  . rosuvastatin (CRESTOR) 20 MG tablet Take 1 tablet (20 mg total) by mouth daily. 90 tablet 3  . lansoprazole (PREVACID) 15 MG capsule Take 15 mg by mouth daily at 12 noon.      No current facility-administered medications for this visit.     Allergies as of 02/10/2017 - Review Complete 02/10/2017  Allergen Reaction Noted  . Protonix [pantoprazole sodium]  03/27/2016  . Shellfish allergy  02/18/2011    ROS:  General: Negative for anorexia, weight loss, fever, chills, fatigue, weakness. ENT: Negative for hoarseness, difficulty swallowing , nasal congestion. CV: Negative for chest pain, angina, palpitations, dyspnea on exertion, peripheral edema.  Respiratory: Negative for dyspnea at rest, dyspnea on exertion, cough, sputum, wheezing.  GI: See history of present illness. GU:  Negative for dysuria, hematuria, urinary incontinence, urinary frequency, nocturnal urination.  Endo: Negative for unusual weight change.    Physical Examination:   BP 135/80   Pulse 71   Temp (!) 97.5 F (36.4 C) (Oral)   Ht 5' 2.5" (1.588 m)   Wt 166 lb 6.4 oz (75.5 kg)   BMI 29.95 kg/m   General: Well-nourished, well-developed in no acute distress.  Eyes: No icterus. Mouth: Oropharyngeal mucosa moist and pink , no lesions erythema or exudate. Lungs: Clear to auscultation bilaterally.  Heart: Regular rate and rhythm, no murmurs rubs or gallops.  Abdomen: Bowel sounds are normal, nontender, nondistended, no hepatosplenomegaly or masses, no abdominal bruits or hernia , no rebound or guarding.   Extremities: No lower extremity edema. No clubbing or deformities. Neuro: Alert and oriented x 4   Skin: Warm and dry, no jaundice.   Psych: Alert and cooperative, normal mood and affect.  Labs:  Lab Results  Component Value Date   FERRITIN 99 01/28/2017   Lab Results    Component Value Date   WBC 7.2 01/28/2017   HGB 12.9 01/28/2017   HCT 40.0 01/28/2017   MCV 76.2 (L) 01/28/2017   PLT 275 01/28/2017   Lab Results  Component Value Date   CREATININE 1.06 (H) 11/27/2016   BUN 10 11/27/2016   NA 140 11/27/2016   K 3.8 11/27/2016   CL 105 11/27/2016   CO2 27 11/27/2016   Lab Results  Component Value Date   ALT 13 (L) 11/27/2016   AST 24 11/27/2016   ALKPHOS 74 11/27/2016   BILITOT 0.6 11/27/2016     Imaging Studies: No results found.

## 2017-02-10 NOTE — Progress Notes (Signed)
cc'ed to pcp °

## 2017-02-10 NOTE — Assessment & Plan Note (Signed)
Mostly constipation with occasional watery stools, possibly spurious diarrhea. Encouraged her to try MiraLAX more regularly, half to one dose at bedtime on days she does not have an adequate bowel movement. Samples provided. She'll call with any questions or concerns. Complete probiotics.

## 2017-02-10 NOTE — Assessment & Plan Note (Signed)
Recent hemoglobin and ferritin good. Last iron infusion in July. She is at risk of chronic occult GI bleeding with history of small bowel AVM, gastritis in the setting of aspirin. Encouraged her to resume lansoprazole given history of peptic ulcer disease/gastritis. Rx sent. She'll continue to have her labs periodically monitored. Due again in January with hematology. Encouraged her to keep that appointment. We will see her back in 6 months.

## 2017-02-21 ENCOUNTER — Other Ambulatory Visit: Payer: Self-pay | Admitting: Cardiology

## 2017-02-25 DIAGNOSIS — R21 Rash and other nonspecific skin eruption: Secondary | ICD-10-CM | POA: Diagnosis not present

## 2017-03-12 DIAGNOSIS — L280 Lichen simplex chronicus: Secondary | ICD-10-CM | POA: Diagnosis not present

## 2017-03-17 DIAGNOSIS — Z0000 Encounter for general adult medical examination without abnormal findings: Secondary | ICD-10-CM | POA: Diagnosis not present

## 2017-03-25 ENCOUNTER — Other Ambulatory Visit (HOSPITAL_COMMUNITY): Payer: Self-pay | Admitting: Adult Health

## 2017-03-25 DIAGNOSIS — D509 Iron deficiency anemia, unspecified: Secondary | ICD-10-CM

## 2017-03-26 ENCOUNTER — Other Ambulatory Visit (HOSPITAL_COMMUNITY): Payer: Self-pay | Admitting: Family Medicine

## 2017-03-26 DIAGNOSIS — Z1231 Encounter for screening mammogram for malignant neoplasm of breast: Secondary | ICD-10-CM

## 2017-03-27 ENCOUNTER — Ambulatory Visit (HOSPITAL_COMMUNITY)
Admission: RE | Admit: 2017-03-27 | Discharge: 2017-03-27 | Disposition: A | Discharge status: Home / Self Care | Payer: Medicare Other | Source: Ambulatory Visit | Attending: Family Medicine | Admitting: Family Medicine

## 2017-03-27 ENCOUNTER — Encounter (HOSPITAL_COMMUNITY): Payer: Self-pay

## 2017-03-27 DIAGNOSIS — Z1231 Encounter for screening mammogram for malignant neoplasm of breast: Secondary | ICD-10-CM | POA: Insufficient documentation

## 2017-04-07 ENCOUNTER — Ambulatory Visit: Payer: Medicare Other | Admitting: Cardiology

## 2017-05-19 DIAGNOSIS — L280 Lichen simplex chronicus: Secondary | ICD-10-CM | POA: Diagnosis not present

## 2017-06-04 DIAGNOSIS — E119 Type 2 diabetes mellitus without complications: Secondary | ICD-10-CM | POA: Diagnosis not present

## 2017-06-04 DIAGNOSIS — D649 Anemia, unspecified: Secondary | ICD-10-CM | POA: Diagnosis not present

## 2017-06-04 DIAGNOSIS — G4739 Other sleep apnea: Secondary | ICD-10-CM | POA: Diagnosis not present

## 2017-06-04 DIAGNOSIS — E782 Mixed hyperlipidemia: Secondary | ICD-10-CM | POA: Diagnosis not present

## 2017-06-05 ENCOUNTER — Other Ambulatory Visit (HOSPITAL_COMMUNITY): Payer: Medicare Other

## 2017-06-05 ENCOUNTER — Ambulatory Visit (HOSPITAL_COMMUNITY): Payer: Medicare Other

## 2017-06-09 ENCOUNTER — Inpatient Hospital Stay (HOSPITAL_BASED_OUTPATIENT_CLINIC_OR_DEPARTMENT_OTHER): Payer: PPO | Admitting: Oncology

## 2017-06-09 ENCOUNTER — Inpatient Hospital Stay (HOSPITAL_COMMUNITY): Payer: PPO | Attending: Oncology

## 2017-06-09 ENCOUNTER — Encounter (HOSPITAL_COMMUNITY): Payer: Self-pay | Admitting: Oncology

## 2017-06-09 VITALS — BP 127/74 | HR 79 | Temp 97.6°F | Resp 18 | Wt 167.5 lb

## 2017-06-09 DIAGNOSIS — D509 Iron deficiency anemia, unspecified: Secondary | ICD-10-CM | POA: Diagnosis not present

## 2017-06-09 DIAGNOSIS — D563 Thalassemia minor: Secondary | ICD-10-CM

## 2017-06-09 DIAGNOSIS — M7989 Other specified soft tissue disorders: Secondary | ICD-10-CM

## 2017-06-09 DIAGNOSIS — R21 Rash and other nonspecific skin eruption: Secondary | ICD-10-CM | POA: Insufficient documentation

## 2017-06-09 DIAGNOSIS — R609 Edema, unspecified: Secondary | ICD-10-CM | POA: Diagnosis not present

## 2017-06-09 DIAGNOSIS — D500 Iron deficiency anemia secondary to blood loss (chronic): Secondary | ICD-10-CM

## 2017-06-09 LAB — IRON AND TIBC
Iron: 53 ug/dL (ref 28–170)
SATURATION RATIOS: 18 % (ref 10.4–31.8)
TIBC: 298 ug/dL (ref 250–450)
UIBC: 245 ug/dL

## 2017-06-09 LAB — COMPREHENSIVE METABOLIC PANEL
ALBUMIN: 3.9 g/dL (ref 3.5–5.0)
ALT: 14 U/L (ref 14–54)
ANION GAP: 8 (ref 5–15)
AST: 22 U/L (ref 15–41)
Alkaline Phosphatase: 73 U/L (ref 38–126)
BUN: 10 mg/dL (ref 6–20)
CALCIUM: 8.8 mg/dL — AB (ref 8.9–10.3)
CO2: 26 mmol/L (ref 22–32)
Chloride: 103 mmol/L (ref 101–111)
Creatinine, Ser: 0.83 mg/dL (ref 0.44–1.00)
GFR calc non Af Amer: >60 mL/min (ref >60)
GLUCOSE: 101 mg/dL — AB (ref 65–99)
POTASSIUM: 4.3 mmol/L (ref 3.5–5.1)
SODIUM: 137 mmol/L (ref 135–145)
Total Bilirubin: 0.4 mg/dL (ref 0.3–1.2)
Total Protein: 6.6 g/dL (ref 6.5–8.1)

## 2017-06-09 LAB — CBC WITH DIFFERENTIAL/PLATELET
BASOS PCT: 0 %
Basophils Absolute: 0 10*3/uL (ref 0.0–0.1)
EOS PCT: 6 %
Eosinophils Absolute: 0.5 10*3/uL (ref 0.0–0.7)
HCT: 41.8 % (ref 36.0–46.0)
Hemoglobin: 13.1 g/dL (ref 12.0–15.0)
LYMPHS ABS: 1.8 10*3/uL (ref 0.7–4.0)
Lymphocytes Relative: 22 %
MCH: 24.8 pg — AB (ref 26.0–34.0)
MCHC: 31.3 g/dL (ref 30.0–36.0)
MCV: 79 fL (ref 78.0–100.0)
MONOS PCT: 8 %
Monocytes Absolute: 0.6 10*3/uL (ref 0.1–1.0)
NEUTROS PCT: 64 %
Neutro Abs: 5.2 10*3/uL (ref 1.7–7.7)
Platelets: 256 10*3/uL (ref 150–400)
RBC: 5.29 MIL/uL — ABNORMAL HIGH (ref 3.87–5.11)
RDW: 15 % (ref 11.5–15.5)
WBC: 8.1 10*3/uL (ref 4.0–10.5)

## 2017-06-09 LAB — FERRITIN: Ferritin: 59 ng/mL (ref 11–307)

## 2017-06-09 NOTE — Progress Notes (Signed)
Nancy Liu, [place] [address]   CLINIC:  Medical Oncology/Hematology  PCP:  Lucianne Lei, [address] [place] [address] [phone]   REASON FOR VISIT:  Follow-up for microcytic anemia   CURRENT THERAPY: Initial work-up   HISTORY OF PRESENT ILLNESS:  (From Dr. Laverle Patter visit on 07/23/16)     INTERVAL HISTORY:  Patient returns today for microcytic anemia and to review her laboratory results.  Most recent anemia workup showed iron deficiency and thalassemia minor trait on hemoglobin electrophoresis.  She has been taking her daily  iron supplement as prescribed.  Her hemoglobin today is 13.1.  She complains of right leg swelling on most recent visit to her PCP.  She did not noticed the swelling but now is keeping a close eye on it.  She occasionally elevates the leg with relief.  She continues to complain of dry skin on both hands and left leg.  She has been prescribed hydroxyzine and a cream which helps.  Otherwise she offers no specific complaints.   REVIEW OF SYSTEMS:  Review of Systems  Constitutional: Negative.  Negative for appetite change, chills, fatigue and fever.  HENT:  Negative.  Negative for hearing loss, lump/mass, mouth sores, nosebleeds, sore throat and tinnitus.        Dry skin on her face above her upper lip  Eyes: Negative.  Negative for eye problems and icterus.  Respiratory: Negative.  Negative for chest tightness, cough, hemoptysis, shortness of breath and wheezing.   Cardiovascular: Positive for leg swelling. Negative for chest pain and palpitations.       Mild edema per PCP of right leg.  Relieved with rest and elevation  Gastrointestinal: Negative.  Negative for abdominal distention, abdominal pain, blood in stool, constipation, diarrhea, nausea and vomiting.  Endocrine: Negative.  Negative for hot flashes.  Genitourinary: Negative.  Negative for difficulty urinating, dysuria, frequency and hematuria.     Musculoskeletal: Negative.  Negative for arthralgias and neck pain.  Skin: Positive for itching and rash.  Neurological: Negative.  Negative for dizziness, headaches and speech difficulty.  Hematological: Negative.  Negative for adenopathy. Does not bruise/bleed easily.  Psychiatric/Behavioral: Negative.  Negative for confusion, depression and sleep disturbance. The patient is not nervous/anxious.      PAST MEDICAL/SURGICAL HISTORY:  Past Medical History:  Diagnosis Date  . Arteriosclerotic cardiovascular disease (ASCVD) 2001   Bare-metal stent placed in the right coronary artery in 12/01; residual 50% lesion of the first diagonal and mid LAD  . Cerebrovascular disease   . Diabetes mellitus    excellent control with a low-dose of a single oral agent  . GERD (gastroesophageal reflux disease)   . History of right coronary artery stent placement 2000  . Hyperlipidemia   . Hypertension   . Thalassemia minor   . Tobacco abuse, in remission    35 pack years; Quit in 1980   Past Surgical History:  Procedure Laterality Date  . COLONOSCOPY  2006   Dr. Collene Mares: normal  . COLONOSCOPY  2000   Dr. Everlean Cherry: normal   . COLONOSCOPY N/A 03/03/2016   Procedure: COLONOSCOPY;  Surgeon: Danie Binder, MD;  [facility];  Service: Endoscopy;  Laterality: N/A;  . DILATION AND CURETTAGE OF UTERUS    . ESOPHAGOGASTRODUODENOSCOPY  2000   Dr. Everlean Cherry: gastritis   . ESOPHAGOGASTRODUODENOSCOPY  2006   Dr. Collene Mares: small hiatal hernia, gastritis, negative H.pylori   . ESOPHAGOGASTRODUODENOSCOPY N/A 03/03/2016  Procedure: ESOPHAGOGASTRODUODENOSCOPY (EGD);  Surgeon: Danie Binder, MD;  [facility];  Service: Endoscopy;  Laterality: N/A;  . ESOPHAGOGASTRODUODENOSCOPY N/A 06/03/2016   Procedure: ESOPHAGOGASTRODUODENOSCOPY (EGD);  Surgeon: Danie Binder, MD;  [facility];  Service: Endoscopy;  Laterality: N/A;  130   . INGUINAL HERNIA REPAIR  1970s   Right  . VAGINAL HYSTERECTOMY   1980   Unilateral oophorectomy     SOCIAL HISTORY:  Social History   Socioeconomic History  . Marital status: Divorced    Spouse name: Not on file  . Number of children: 3  . Years of education: Not on file  . Highest education level: Not on file  Social Needs  . Financial resource strain: Not on file  . Food insecurity - worry: Not on file  . Food insecurity - inability: Not on file  . Transportation needs - medical: Not on file  . Transportation needs - non-medical: Not on file  Occupational History  . Occupation: Retired    Comment: [employer] work  Tobacco Use  . Smoking status: Former Smoker    Packs/day: 1.00    Years: 18.00    Pack years: 18.00    Types: Cigarettes    Start date: 06/02/1965    Last attempt to quit: 06/09/1980    Years since quitting: 37.0  . Smokeless tobacco: Never Used  Substance and Sexual Activity  . Alcohol use: No    Alcohol/week: 0.0 oz  . Drug use: No  . Sexual activity: No  Other Topics Concern  . Not on file  Social History Narrative  . Not on file    FAMILY HISTORY:  Family History  Problem Relation Age of Onset  . Diabetes Sister   . Heart disease Mother        also hypertension and asthma  . Cancer Mother   . Colon cancer Neg Hx   . Colon polyps Neg Hx     CURRENT MEDICATIONS:  Outpatient Encounter Medications as of 06/09/2017  Medication Sig  . amLODipine (NORVASC) 10 MG tablet TAKE ONE TABLET BY MOUTH ONCE DAILY  . aspirin 81 MG tablet Take 81 mg by mouth daily.    . bimatoprost (LUMIGAN) 0.01 % SOLN Place 1 drop into both eyes at bedtime.  . carvedilol (COREG) 6.25 MG tablet Take 6.25 mg by mouth 2 (two) times daily.  . hydroxypropyl methylcellulose / hypromellose (ISOPTO TEARS / GONIOVISC) 2.5 % ophthalmic solution Place 1 drop into both eyes 3 (three) times daily as needed for dry eyes.  Marland Kitchen irbesartan (AVAPRO) 300 MG tablet TAKE ONE TABLET BY MOUTH ONCE DAILY  . lansoprazole (PREVACID) 30 MG capsule Take 1 capsule (30 mg  total) by mouth daily before breakfast.  . metFORMIN (GLUCOPHAGE) 500 MG tablet Take 500 mg by mouth daily with breakfast.  . POLY-IRON 150 FORTE 150-25-1 MG-MCG-MG CAPS TAKE 1 CAPSULE BY MOUTH ONCE DAILY  . polyethylene glycol powder (GLYCOLAX/MIRALAX) powder Take one packet (17 grams) at bedtime on days you do not have good bowel movement.  . rosuvastatin (CRESTOR) 20 MG tablet TAKE ONE TABLET BY MOUTH ONCE DAILY  . rosuvastatin (CRESTOR) 20 MG tablet Take 1 tablet (20 mg total) by mouth daily.   No facility-administered encounter medications on file as of 06/09/2017.     ALLERGIES:  Allergies  Allergen Reactions  . Protonix [Pantoprazole Sodium]     DRY LIPS  . Shellfish Allergy     Unknown reaction     PHYSICAL  EXAM:  ECOG Performance status: 0 - Asymptomatic, independent   Vitals:   06/09/17 1441  BP: 127/74  Pulse: 79  Resp: 18  Temp: 97.6 F (36.4 C)  SpO2: 96%   Filed Weights   06/09/17 1441  Weight: 167 lb 8 oz (76 kg)    Physical Exam  Constitutional: She is oriented to person, place, and time and well-developed, well-nourished, and in no distress.  HENT:  Head: Normocephalic.  Mouth/Throat: Oropharynx is clear and moist. No oropharyngeal exudate.  Eyes: Conjunctivae are normal. Pupils are equal, round, and reactive to light. No scleral icterus.  Neck: Normal range of motion. Neck supple.  Cardiovascular: Normal rate, regular rhythm and normal heart sounds. Exam reveals no gallop.  No murmur heard. Pulmonary/Chest: Effort normal and breath sounds normal. No respiratory distress.  Abdominal: Soft. Bowel sounds are normal. There is no tenderness.  Musculoskeletal: Normal range of motion. She exhibits no edema.  Lymphadenopathy:    She has no cervical adenopathy.       Right: No supraclavicular adenopathy present.       Left: No supraclavicular adenopathy present.  Neurological: She is alert and oriented to person, place, and time. No cranial nerve deficit.  Gait normal.  Skin: Skin is warm and dry. Rash noted.  On hands and left leg  Psychiatric: Mood, memory, affect and judgment normal.  Nursing note and vitals reviewed.    LABORATORY DATA:  I have reviewed the labs as listed.  CBC    Component Value Date/Time   WBC 8.1 06/09/2017 1406   RBC 5.29 (H) 06/09/2017 1406   HGB 13.1 06/09/2017 1406   HCT 41.8 06/09/2017 1406   PLT 256 06/09/2017 1406   MCV 79.0 06/09/2017 1406   MCH 24.8 (L) 06/09/2017 1406   MCHC 31.3 06/09/2017 1406   RDW 15.0 06/09/2017 1406   LYMPHSABS 1.8 06/09/2017 1406   MONOABS 0.6 06/09/2017 1406   EOSABS 0.5 06/09/2017 1406   BASOSABS 0.0 06/09/2017 1406   CMP Latest Ref Rng & Units 06/09/2017 11/27/2016 07/23/2016  Glucose 65 - 99 mg/dL 101(H) 93 96  BUN 6 - 20 mg/dL 10 10 15   Creatinine 0.44 - 1.00 mg/dL 0.83 1.06(H) 0.77  Sodium 135 - 145 mmol/L 137 140 140  Potassium 3.5 - 5.1 mmol/L 4.3 3.8 4.2  Chloride 101 - 111 mmol/L 103 105 104  CO2 22 - 32 mmol/L 26 27 28   Calcium 8.9 - 10.3 mg/dL 8.8(L) 9.1 9.2  Total Protein 6.5 - 8.1 g/dL 6.6 7.0 7.1  Total Bilirubin 0.3 - 1.2 mg/dL 0.4 0.6 0.4  Alkaline Phos 38 - 126 U/L 73 74 77  AST 15 - 41 U/L 22 24 23   ALT 14 - 54 U/L 14 13(L) 17   Results for Nancy, Liu (MRN [id number])   Ref. Range 07/23/2016 14:53  Iron Latest Ref Range: 28 - 170 ug/dL 57  UIBC Latest Units: ug/dL 310  TIBC Latest Ref Range: 250 - 450 ug/dL 367  Saturation Ratios Latest Ref Range: 10.4 - 31.8 % 16  Ferritin Latest Ref Range: 11 - 307 ng/mL 14  Folate Latest Ref Range: >5.9 ng/mL 19.1   Results for Nancy, Liu (MRN [id number])   Ref. Range 07/23/2016 14:53  Copper Latest Ref Range: 72 - 166 ug/dL 143  Vitamin B12 Latest Ref Range: 180 - 914 pg/mL 251   Results for Nancy, Liu (MRN [id number])   Ref. Range 07/23/2016 14:54  Total Protein ELP Latest Ref Range:  6.0 - 8.5 g/dL 6.7  Albumin ELP Latest Ref Range: 2.9 - 4.4 g/dL 3.7  Globulin, Total Latest Ref  Range: 2.2 - 3.9  3.0  A/G Ratio Latest Ref Range: 0.7 - 1.7  1.2  Alpha-1-Globulin Latest Ref Range: 0.0 - 0.4 g/dL 0.2  Alpha-2-Globulin Latest Ref Range: 0.4 - 1.0 g/dL 0.9  Beta Globulin Latest Ref Range: 0.7 - 1.3 g/dL 1.0  Gamma Globulin Latest Ref Range: 0.4 - 1.8 g/dL 0.8  M-SPIKE, % Latest Ref Range: Not Observed g/dL Not Observed  SPE Interp. Unknown Comment  Comment Unknown Comment   Results for Nancy, Liu (MRN [id number])   Ref. Range 07/23/2016 14:53  Hgb A Latest Ref Range: 96.4 - 98.8 % 98.1  Hgb A2 Quant Latest Ref Range: 1.8 - 3.2 % 0.9 (L)  Hgb F Quant Latest Ref Range: 0.0 - 2.0 % 0.0  Hgb S Quant Latest Ref Range: 0.0 % 0.0  HGB C Latest Ref Range: 0.0 % 0.0  HGB VARIANT Latest Ref Range: 0.0 % 1.0 (H)  Please Note: Unknown Comment     Results for Nancy, Liu (MRN [id number])  Ref. Range 07/23/2016 14:54  IgG (Immunoglobin G), Serum Latest Ref Range: 700 - 1,600 mg/dL 743  IgA Latest Ref Range: 64 - 422 mg/dL 168     Results for Nancy, Liu (MRN [id number])  Ref. Range 07/23/2016 14:54  Alpha-1-Globulin Latest Ref Range: 0.0 - 0.4 g/dL 0.2  Alpha-2-Globulin Latest Ref Range: 0.4 - 1.0 g/dL 0.9      PENDING LABS:    DIAGNOSTIC IMAGING:    PATHOLOGY:      ASSESSMENT & PLAN:   Microcytic anemia:  -Secondary to thalassemia minor.  No M spike or gammopathy.  Patient is not anemic.  Most recent labs indicate hemoglobin of 13.1.  Iron labs pending.  She continues her Ferrex forte once daily.   -Return to cancer center in 6 months with labs for continued follow-up.   Dispo:  -Return to cancer center in 6 months with labs for continued follow-up.    All questions were answered to patient's stated satisfaction. Encouraged patient to call with any new concerns or questions before her next visit to the cancer center and we can certain see her sooner, if needed.    Faythe Casa, NP 06/09/2017 3:05 PM

## 2017-06-09 NOTE — Patient Instructions (Signed)
[facility]  Discharge Instructions:  You were seen by the nurse practitioner today.  _______________________________________________________________  Thank you for choosing [facility] at [facility] to provide your oncology and hematology care.  To afford each patient quality time with our providers, please arrive at least 15 minutes before your scheduled appointment.  You need to re-schedule your appointment if you arrive 10 or more minutes late.  We strive to give you quality time with our providers, and arriving late affects you and other patients whose appointments are after yours.  Also, if you no show three or more times for appointments you may be dismissed from the clinic.  Again, thank you for choosing [facility] at [facility]. Our hope is that these requests will allow you access to exceptional care and in a timely manner. _______________________________________________________________  If you have questions after your visit, please contact our office at [phone] between the hours of 8:30 a.m. and 5:00 p.m. Voicemails left after 4:30 p.m. will not be returned until the following business day. _______________________________________________________________  For prescription refill requests, have your pharmacy contact our office. _______________________________________________________________  Recommendations made by the consultant and any test results will be sent to your referring physician. _______________________________________________________________ 

## 2017-07-21 DIAGNOSIS — H401131 Primary open-angle glaucoma, bilateral, mild stage: Secondary | ICD-10-CM | POA: Diagnosis not present

## 2017-07-21 DIAGNOSIS — H2513 Age-related nuclear cataract, bilateral: Secondary | ICD-10-CM | POA: Diagnosis not present

## 2017-07-31 ENCOUNTER — Encounter: Payer: Self-pay | Admitting: Cardiology

## 2017-07-31 ENCOUNTER — Ambulatory Visit (INDEPENDENT_AMBULATORY_CARE_PROVIDER_SITE_OTHER): Payer: PPO | Admitting: Cardiology

## 2017-07-31 VITALS — BP 118/70 | HR 85 | Ht 62.0 in | Wt 170.0 lb

## 2017-07-31 DIAGNOSIS — R002 Palpitations: Secondary | ICD-10-CM

## 2017-07-31 DIAGNOSIS — I10 Essential (primary) hypertension: Secondary | ICD-10-CM

## 2017-07-31 DIAGNOSIS — E782 Mixed hyperlipidemia: Secondary | ICD-10-CM | POA: Diagnosis not present

## 2017-07-31 DIAGNOSIS — I2510 Atherosclerotic heart disease of native coronary artery without angina pectoris: Secondary | ICD-10-CM | POA: Diagnosis not present

## 2017-07-31 MED ORDER — IRBESARTAN 150 MG PO TABS: 150.0000 mg | ORAL_TABLET | Freq: Every day | ORAL | 3 refills | Status: DC

## 2017-07-31 NOTE — Patient Instructions (Signed)
Medication Instructions:  DECREASE IRBESARTAN TO 150 MG DAILY   Labwork: I WILL REQUEST LABS FROM PCP   Testing/Procedures: NONE  Follow-Up: Your physician wants you to follow-up in: 1 YEAR.  You will receive a reminder letter in the mail two months in advance. If you don't receive a letter, please call our office to schedule the follow-up appointment.    Any Other Special Instructions Will Be Listed Below (If Applicable).     If you need a refill on your cardiac medications before your next appointment, please call your pharmacy.

## 2017-07-31 NOTE — Progress Notes (Signed)
Clinical Summary Nancy Liu is a 77 y.o.female seen today for follow up of the following medical problems.   1. CAD  - prior BMS to RCA in 2001 at [facility]  - 02/2011 MPI no ischemia  - echo 04/2013 showed LVEF 60-65% with grade I diastolic dysfunction.   - no recent chest pain, no SOB/DOE - compliant with meds  2. HTN  - recently stopped taking her irbesartan, otherwise compliant with meds  3. Hyperlpidiemia  - compliant with statin - 05/2016 TC 134 TG 100 HDL 40 LDL 75  - upcoming labs with pcp  4. Palpitations - no recent symptoms   5. SOB/Abnormal PFTs - former smoker x 30-35 years. - PFTs Jan 2017 with severely redcued DLCO, referred to Dr Luan Pulling.  - followed by Dr Luan Pulling  6. Anemia -followed by Dr Talbert Cage hematology     SH: has 3 children. 2 grandchilren, one is a Marine scientist working at childrens hospital in [place]. Doristine Devoid grandbaby is on the way in November.    Past Medical History:  Diagnosis Date  . Arteriosclerotic cardiovascular disease (ASCVD) 2001   Bare-metal stent placed in the right coronary artery in 12/01; residual 50% lesion of the first diagonal and mid LAD  . Cerebrovascular disease   . Diabetes mellitus    excellent control with a low-dose of a single oral agent  . GERD (gastroesophageal reflux disease)   . History of right coronary artery stent placement 2000  . Hyperlipidemia   . Hypertension   . Thalassemia minor   . Tobacco abuse, in remission    35 pack years; Quit in 1980     Allergies  Allergen Reactions  . Protonix [Pantoprazole Sodium]     DRY LIPS  . Shellfish Allergy     Unknown reaction     Current Outpatient Medications  Medication Sig Dispense Refill  . amLODipine (NORVASC) 10 MG tablet TAKE ONE TABLET BY MOUTH ONCE DAILY 90 tablet 2  . aspirin 81 MG tablet Take 81 mg by mouth daily.      . bimatoprost (LUMIGAN) 0.01 % SOLN Place 1 drop into both eyes at bedtime.    . carvedilol  (COREG) 6.25 MG tablet Take 6.25 mg by mouth 2 (two) times daily.    . hydroxypropyl methylcellulose / hypromellose (ISOPTO TEARS / GONIOVISC) 2.5 % ophthalmic solution Place 1 drop into both eyes 3 (three) times daily as needed for dry eyes.    Marland Kitchen irbesartan (AVAPRO) 300 MG tablet TAKE ONE TABLET BY MOUTH ONCE DAILY 90 tablet 3  . lansoprazole (PREVACID) 30 MG capsule Take 1 capsule (30 mg total) by mouth daily before breakfast. 30 capsule 11  . metFORMIN (GLUCOPHAGE) 500 MG tablet Take 500 mg by mouth daily with breakfast.    . POLY-IRON 150 FORTE 150-25-1 MG-MCG-MG CAPS TAKE 1 CAPSULE BY MOUTH ONCE DAILY 90 each 1  . polyethylene glycol powder (GLYCOLAX/MIRALAX) powder Take one packet (17 grams) at bedtime on days you do not have good bowel movement. 225 g 0  . rosuvastatin (CRESTOR) 20 MG tablet TAKE ONE TABLET BY MOUTH ONCE DAILY 30 tablet 3  . rosuvastatin (CRESTOR) 20 MG tablet Take 1 tablet (20 mg total) by mouth daily. 90 tablet 3   No current facility-administered medications for this visit.      Past Surgical History:  Procedure Laterality Date  . COLONOSCOPY  2006   Dr. Collene Mares: normal  . COLONOSCOPY  2000   Dr. Everlean Cherry: normal   .  COLONOSCOPY N/A 03/03/2016   Procedure: COLONOSCOPY;  Surgeon: Danie Binder, MD;  [facility];  Service: Endoscopy;  Laterality: N/A;  . DILATION AND CURETTAGE OF UTERUS    . ESOPHAGOGASTRODUODENOSCOPY  2000   Dr. Everlean Cherry: gastritis   . ESOPHAGOGASTRODUODENOSCOPY  2006   Dr. Collene Mares: small hiatal hernia, gastritis, negative H.pylori   . ESOPHAGOGASTRODUODENOSCOPY N/A 03/03/2016   Procedure: ESOPHAGOGASTRODUODENOSCOPY (EGD);  Surgeon: Danie Binder, MD;  [facility];  Service: Endoscopy;  Laterality: N/A;  . ESOPHAGOGASTRODUODENOSCOPY N/A 06/03/2016   Procedure: ESOPHAGOGASTRODUODENOSCOPY (EGD);  Surgeon: Danie Binder, MD;  [facility];  Service: Endoscopy;  Laterality: N/A;  130   . INGUINAL HERNIA REPAIR  1970s    Right  . VAGINAL HYSTERECTOMY  1980   Unilateral oophorectomy     Allergies  Allergen Reactions  . Protonix [Pantoprazole Sodium]     DRY LIPS  . Shellfish Allergy     Unknown reaction      Family History  Problem Relation Age of Onset  . Diabetes Sister   . Heart disease Mother        also hypertension and asthma  . Cancer Mother   . Colon cancer Neg Hx   . Colon polyps Neg Hx      Social History Nancy Liu reports that she quit smoking about 37 years ago. Her smoking use included cigarettes. She started smoking about 52 years ago. She has a 18.00 pack-year smoking history. she has never used smokeless tobacco. Nancy Liu reports that she does not drink alcohol.   Review of Systems CONSTITUTIONAL: No weight loss, fever, chills, weakness or fatigue.  HEENT: Eyes: No visual loss, blurred vision, double vision or yellow sclerae.No hearing loss, sneezing, congestion, runny nose or sore throat.  SKIN: No rash or itching.  CARDIOVASCULAR: per hpi RESPIRATORY: per hpi GASTROINTESTINAL: No anorexia, nausea, vomiting or diarrhea. No abdominal pain or blood.  GENITOURINARY: No burning on urination, no polyuria NEUROLOGICAL: No headache, dizziness, syncope, paralysis, ataxia, numbness or tingling in the extremities. No change in bowel or bladder control.  MUSCULOSKELETAL: No muscle, back pain, joint pain or stiffness.  LYMPHATICS: No enlarged nodes. No history of splenectomy.  PSYCHIATRIC: No history of depression or anxiety.  ENDOCRINOLOGIC: No reports of sweating, cold or heat intolerance. No polyuria or polydipsia.  Marland Kitchen   Physical Examination Vitals:   07/31/17 0949  BP: 118/70  Pulse: 85  SpO2: 95%   Vitals:   07/31/17 0949  Weight: 170 lb (77.1 kg)  Height: 5\' 2"  (1.575 m)    Gen: resting comfortably, no acute distress HEENT: no scleral icterus, pupils equal round and reactive, no palptable cervical adenopathy,  CV: RRR, no m/r/g, no jvd Resp: Clear to  auscultation bilaterally GI: abdomen is soft, non-tender, non-distended, normal bowel sounds, no hepatosplenomegaly MSK: extremities are warm, no edema.  Skin: warm, no rash Neuro:  no focal deficits Psych: appropriate affect   Diagnostic Studies 02/2011 MPI Low risk exercise/Lexiscan Myoview as outlined. Equivocal ST- segment changes were noted in the setting of lead motion artifact. No chest pain reported. Perfusion imaging is most consistent with breast attenuation without clear evidence of scar or ischemia. LVEF 77%.  04/06/13 Clinic EKG: sinus rhythm, nomral axis, normal intervals, non-specific ST/T changes  04/2013 Echo LVEF 19-37%, grade I diastolic dysfunction,   Jan 2017 PFTs: normal spirometry, minimal restrictive changes, severely decreased DLCO     Assessment and Plan  1. CAD -no symptoms, continue current  meds  2. HTN  - she stopped taking irbesartan. Though bp is good today, would restart at lower dose 150mg  daily for the CAD related benefits  3. Hyperlipidemia  - continue statin, request pcp labs  4. Palpitations - no recent symptoms, continue to monitor.        Arnoldo Lenis, M.D.

## 2017-08-06 ENCOUNTER — Encounter: Payer: Self-pay | Admitting: Cardiology

## 2017-08-08 DIAGNOSIS — E782 Mixed hyperlipidemia: Secondary | ICD-10-CM | POA: Diagnosis not present

## 2017-08-08 DIAGNOSIS — R002 Palpitations: Secondary | ICD-10-CM | POA: Diagnosis not present

## 2017-08-08 DIAGNOSIS — I119 Hypertensive heart disease without heart failure: Secondary | ICD-10-CM | POA: Diagnosis not present

## 2017-08-08 DIAGNOSIS — D649 Anemia, unspecified: Secondary | ICD-10-CM | POA: Diagnosis not present

## 2017-08-08 DIAGNOSIS — E119 Type 2 diabetes mellitus without complications: Secondary | ICD-10-CM | POA: Diagnosis not present

## 2017-08-08 DIAGNOSIS — F518 Other sleep disorders not due to a substance or known physiological condition: Secondary | ICD-10-CM | POA: Diagnosis not present

## 2017-08-08 DIAGNOSIS — I10 Essential (primary) hypertension: Secondary | ICD-10-CM | POA: Diagnosis not present

## 2017-08-08 DIAGNOSIS — I2510 Atherosclerotic heart disease of native coronary artery without angina pectoris: Secondary | ICD-10-CM | POA: Diagnosis not present

## 2017-08-10 ENCOUNTER — Ambulatory Visit (INDEPENDENT_AMBULATORY_CARE_PROVIDER_SITE_OTHER): Payer: PPO | Admitting: Gastroenterology

## 2017-08-10 ENCOUNTER — Encounter: Payer: Self-pay | Admitting: Gastroenterology

## 2017-08-10 VITALS — BP 137/88 | HR 77 | Temp 97.9°F | Ht 62.5 in | Wt 169.2 lb

## 2017-08-10 DIAGNOSIS — M2012 Hallux valgus (acquired), left foot: Secondary | ICD-10-CM | POA: Diagnosis not present

## 2017-08-10 DIAGNOSIS — D500 Iron deficiency anemia secondary to blood loss (chronic): Secondary | ICD-10-CM

## 2017-08-10 DIAGNOSIS — E1151 Type 2 diabetes mellitus with diabetic peripheral angiopathy without gangrene: Secondary | ICD-10-CM | POA: Diagnosis not present

## 2017-08-10 DIAGNOSIS — M79675 Pain in left toe(s): Secondary | ICD-10-CM | POA: Diagnosis not present

## 2017-08-10 DIAGNOSIS — R197 Diarrhea, unspecified: Secondary | ICD-10-CM | POA: Diagnosis not present

## 2017-08-10 DIAGNOSIS — B351 Tinea unguium: Secondary | ICD-10-CM | POA: Diagnosis not present

## 2017-08-10 MED ORDER — LANSOPRAZOLE 30 MG PO CPDR: 30.0000 mg | DELAYED_RELEASE_CAPSULE | Freq: Every day | ORAL | 11 refills | Status: DC

## 2017-08-10 NOTE — Progress Notes (Signed)
Please let patient know that I sent RX for lansoprazole to [facility]. I forgot prior to leaving the office.   Also ask her to include a little more info on her stool diary. I would like for her to actually include EVERY BM not just the loose stools. She only has to document foods she ate and where she ate with the episodes of loose stool.

## 2017-08-10 NOTE — Patient Instructions (Signed)
1. Please keep a diary regarding episode of diarrhea. List what foods you ate, give name of where you ate (home, church, name the restaurant).  2. You can take 1/2 to 1 Imodium about an hour prior to eating out to prevent diarrhea. If you require using Imodium frequently ie weekly, please let me know as this can cause constipation.  3. We will see you back in 03/2018 or call sooner if needed.

## 2017-08-10 NOTE — Assessment & Plan Note (Signed)
Doing well. Encouraged resuming Prevacid as she is at risk of chronic occult GI bleeding with history of small bowel AVM, gastritis in setting of aspirin. She is in agreement. RX sent in to pharmacy. Return to the office in 03/2018 to see Dr. Oneida Alar.

## 2017-08-10 NOTE — Assessment & Plan Note (Signed)
Episodic postprandial loose stool, occurring 2-3 times per month. She has been requested to keep a stool diary regarding these episodes and drop off when complete, including at least 3 episodes to review. She can use Imodium 1/2 to 1 tablet prior to eating out if needed as long as she does not take frequently.

## 2017-08-10 NOTE — Progress Notes (Signed)
CC'D TO PCP °

## 2017-08-10 NOTE — Progress Notes (Signed)
Lmom, waiting on a return call.  

## 2017-08-10 NOTE — Addendum Note (Signed)
Addended by: Mahala Menghini on: 08/10/2017 09:37 AM   Modules accepted: Orders

## 2017-08-10 NOTE — Progress Notes (Signed)
Primary Care Physician: Lucianne Lei, MD  Primary Gastroenterologist:  Barney Drain, MD   Chief Complaint  Patient presents with  . Diarrhea    occ after eating    HPI: Nancy Liu is a 77 y.o. female here for follow-up of IDA, diarrhea. She was last seen in September 2018. EGD and colonoscopy in October 2017 revealed many non-bleeding gastric ulcers, gastroduodenitis (no H. 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 Diverticulosis and non-bleeding hemorrhoids. Repeat EGD in January 2018 showed erosive gastritis but ulcers had healed.  Recent blood work by hematology showed normal iron, ferritin, Hgb. See below. Patient feels good. No abd pain. No melena, brbpr. She worries about having fecal incontinence due to postprandial loose stools. Happens mostly when eating out but sometimes at home as well. Infrequent episodes, about twice per month. At other times she has BM every 2-3 days, stools are soft/normal, not hard.   No longer on prevacid. Discussed reasoning why she should consider taking prevacid every day or every other day given her h/o ulcers and ongoing ASA use.     Current Outpatient Medications  Medication Sig Dispense Refill  . amLODipine (NORVASC) 10 MG tablet TAKE ONE TABLET BY MOUTH ONCE DAILY 90 tablet 2  . aspirin 81 MG tablet Take 81 mg by mouth daily.      . bimatoprost (LUMIGAN) 0.01 % SOLN Place 1 drop into both eyes at bedtime.    . carvedilol (COREG) 6.25 MG tablet Take 6.25 mg by mouth 2 (two) times daily.    . hydroxypropyl methylcellulose / hypromellose (ISOPTO TEARS / GONIOVISC) 2.5 % ophthalmic solution Place 1 drop into both eyes 3 (three) times daily as needed for dry eyes.    . hydrOXYzine (ATARAX/VISTARIL) 25 MG tablet Take 25 mg by mouth every 6 (six) hours as needed.    . irbesartan (AVAPRO) 150 MG tablet Take 1 tablet (150 mg total) by mouth daily. 90 tablet 3  . metFORMIN (GLUCOPHAGE) 500 MG tablet Take 500 mg by mouth daily  with breakfast.    . POLY-IRON 150 FORTE 150-25-1 MG-MCG-MG CAPS TAKE 1 CAPSULE BY MOUTH ONCE DAILY 90 each 1  . rosuvastatin (CRESTOR) 20 MG tablet Take 1 tablet (20 mg total) by mouth daily. 90 tablet 3   No current facility-administered medications for this visit.     Allergies as of 08/10/2017 - Review Complete 08/10/2017  Allergen Reaction Noted  . Protonix [pantoprazole sodium]  03/27/2016  . Shellfish allergy  02/18/2011    ROS:  General: Negative for anorexia, weight loss, fever, chills, fatigue, weakness. ENT: Negative for hoarseness, difficulty swallowing , nasal congestion. CV: Negative for chest pain, angina, palpitations, dyspnea on exertion, peripheral edema.  Respiratory: Negative for dyspnea at rest, dyspnea on exertion, cough, sputum, wheezing.  GI: See history of present illness. GU:  Negative for dysuria, hematuria, urinary incontinence, urinary frequency, nocturnal urination.  Endo: Negative for unusual weight change.    Physical Examination:   BP 137/88   Pulse 77   Temp 97.9 F (36.6 C) (Oral)   Ht 5' 2.5" (1.588 m)   Wt 169 lb 3.2 oz (76.7 kg)   BMI 30.45 kg/m   General: Well-nourished, well-developed in no acute distress.  Eyes: No icterus. Mouth: Oropharyngeal mucosa moist and pink , no lesions erythema or exudate. Lungs: Clear to auscultation bilaterally.  Heart: Regular rate and rhythm, no murmurs rubs or gallops.  Abdomen: Bowel sounds are normal, nontender, nondistended, no hepatosplenomegaly  or masses, no abdominal bruits or hernia , no rebound or guarding.   Extremities: No lower extremity edema. No clubbing or deformities. Neuro: Alert and oriented x 4   Skin: Warm and dry, no jaundice.   Psych: Alert and cooperative, normal mood and affect.  Labs:  Lab Results  Component Value Date   CREATININE 0.83 06/09/2017   BUN 10 06/09/2017   NA 137 06/09/2017   K 4.3 06/09/2017   CL 103 06/09/2017   CO2 26 06/09/2017   Lab Results    Component Value Date   ALT 14 06/09/2017   AST 22 06/09/2017   ALKPHOS 73 06/09/2017   BILITOT 0.4 06/09/2017   Lab Results  Component Value Date   WBC 8.1 06/09/2017   HGB 13.1 06/09/2017   HCT 41.8 06/09/2017   MCV 79.0 06/09/2017   PLT 256 06/09/2017   Lab Results  Component Value Date   IRON 53 06/09/2017   TIBC 298 06/09/2017   FERRITIN 59 06/09/2017    Imaging Studies: No results found.

## 2017-08-14 NOTE — Progress Notes (Signed)
Pt notified of results

## 2017-08-29 LAB — POCT GLYCOSYLATED HEMOGLOBIN (HGB A1C): Hemoglobin A1C: 5.7

## 2017-08-29 LAB — GLUCOSE, POCT (MANUAL RESULT ENTRY): POC GLUCOSE: 148 mg/dL — AB (ref 70–99)

## 2017-09-09 ENCOUNTER — Ambulatory Visit: Payer: PPO | Attending: Neurology | Admitting: Neurology

## 2017-09-09 DIAGNOSIS — R0683 Snoring: Secondary | ICD-10-CM | POA: Diagnosis not present

## 2017-09-09 DIAGNOSIS — G4733 Obstructive sleep apnea (adult) (pediatric): Secondary | ICD-10-CM | POA: Diagnosis not present

## 2017-09-13 NOTE — Procedures (Signed)
[place] A. Merlene Laughter, MD     [email or web address]             NOCTURNAL POLYSOMNOGRAPHY   LOCATION: [facility]   Patient Name: Nancy Liu, Nancy Liu Date: 09/09/2017 Gender: Female D.O.B: Apr 29, 1941 Age (years): 80 Referring Provider: Lucianne Lei Height (inches): 63 Interpreting Physician: Phillips Odor MD, ABSM Weight (lbs): 169 RPSGT: Peak, Robert BMI: 30 MRN: [id number] Neck Size: 14.00 CLINICAL INFORMATION Sleep Study Type: NPSG     Indication for sleep study: N/A     Epworth Sleepiness Score: 5     SLEEP STUDY TECHNIQUE As per the AASM Manual for the Scoring of Sleep and Associated Events v2.3 (April 2016) with a hypopnea requiring 4% desaturations.  The channels recorded and monitored were frontal, central and occipital EEG, electrooculogram (EOG), submentalis EMG (chin), nasal and oral airflow, thoracic and abdominal wall motion, anterior tibialis EMG, snore microphone, electrocardiogram, and pulse oximetry.  MEDICATIONS Medications self-administered by patient taken the night of the study : N/A  Current Outpatient Medications:  .  amLODipine (NORVASC) 10 MG tablet, TAKE ONE TABLET BY MOUTH ONCE DAILY, Disp: 90 tablet, Rfl: 2 .  aspirin 81 MG tablet, Take 81 mg by mouth daily.  , Disp: , Rfl:  .  bimatoprost (LUMIGAN) 0.01 % SOLN, Place 1 drop into both eyes at bedtime., Disp: , Rfl:  .  carvedilol (COREG) 6.25 MG tablet, Take 6.25 mg by mouth 2 (two) times daily., Disp: , Rfl:  .  hydroxypropyl methylcellulose / hypromellose (ISOPTO TEARS / GONIOVISC) 2.5 % ophthalmic solution, Place 1 drop into both eyes 3 (three) times daily as needed for dry eyes., Disp: , Rfl:  .  hydrOXYzine (ATARAX/VISTARIL) 25 MG tablet, Take 25 mg by mouth every 6 (six) hours as needed., Disp: , Rfl:  .  irbesartan (AVAPRO) 150 MG tablet, Take 1 tablet (150 mg total) by mouth daily., Disp: 90 tablet, Rfl: 3 .  lansoprazole (PREVACID) 30 MG capsule, Take 1  capsule (30 mg total) by mouth daily before breakfast., Disp: 30 capsule, Rfl: 11 .  metFORMIN (GLUCOPHAGE) 500 MG tablet, Take 500 mg by mouth daily with breakfast., Disp: , Rfl:  .  POLY-IRON 150 FORTE 150-25-1 MG-MCG-MG CAPS, TAKE 1 CAPSULE BY MOUTH ONCE DAILY, Disp: 90 each, Rfl: 1 .  rosuvastatin (CRESTOR) 20 MG tablet, Take 1 tablet (20 mg total) by mouth daily., Disp: 90 tablet, Rfl: 3     SLEEP ARCHITECTURE The study was initiated at 10:41:52 PM and ended at 5:30:10 AM.  Sleep onset time was 14.0 minutes and the sleep efficiency was 68.7%%. The total sleep time was 280.3 minutes.  Stage REM latency was 116.0 minutes.  The patient spent 26.6%% of the night in stage N1 sleep, 39.2%% in stage N2 sleep, 13.2%% in stage N3 and 21.05% in REM.  Alpha intrusion was absent.  Supine sleep was 1.07%.  RESPIRATORY PARAMETERS The overall apnea/hypopnea index (AHI) was 8.6 per hour. There were 3 total apneas, including 3 obstructive, 0 central and 0 mixed apneas. There were 37 hypopneas and 18 RERAs.  The AHI during Stage REM sleep was 15.3 per hour.  AHI while supine was 0.0 per hour.  The mean oxygen saturation was 91.8%. The minimum SpO2 during sleep was 87.0%.  soft snoring was noted during this study.  CARDIAC DATA The 2 lead EKG demonstrated sinus rhythm. The mean heart rate was 69.8 beats per minute. Other EKG findings include: PVCs. LEG MOVEMENT DATA The total PLMS were 0 with  a resulting PLMS index of 0.0. Associated arousal with leg movement index was 0.0.  IMPRESSIONS Mild obstructive sleep apnea is observed during this recording. The severity does not require positive pressure treatment.  Delano Metz, MD Diplomate, American Board of Sleep Medicine.  ELECTRONICALLY SIGNED ON:  09/13/2017, 9:09 PM [place] PH: [phone]   FX: [phone] [place]

## 2017-10-19 DIAGNOSIS — E1140 Type 2 diabetes mellitus with diabetic neuropathy, unspecified: Secondary | ICD-10-CM | POA: Diagnosis not present

## 2017-10-19 DIAGNOSIS — B351 Tinea unguium: Secondary | ICD-10-CM | POA: Diagnosis not present

## 2017-10-19 DIAGNOSIS — M79675 Pain in left toe(s): Secondary | ICD-10-CM | POA: Diagnosis not present

## 2017-10-27 IMAGING — CT CT CHEST HIGH RESOLUTION W/O CM
2 of 5 series · 15 of 36 positions shown, 18 images · non-contrast
Comparison: Chest radiograph 08/27/2015.

CLINICAL DATA: Abnormal chest radiograph, shortness breath on
exertion, chronic shortness of breath.

EXAM:
CT CHEST WITHOUT CONTRAST
TECHNIQUE: Multidetector CT imaging of the chest was performed following the
standard protocol without intravenous contrast. High resolution
imaging of the lungs, as well as inspiratory and expiratory imaging,
was performed.

[Series 7: hi res thins portal/super d · axial · portal-venous · 0.60mm/px · z∈[-182,+63]mm · 12 of 342 slices shown, 15 images]
[im 18/342  mediastinal]
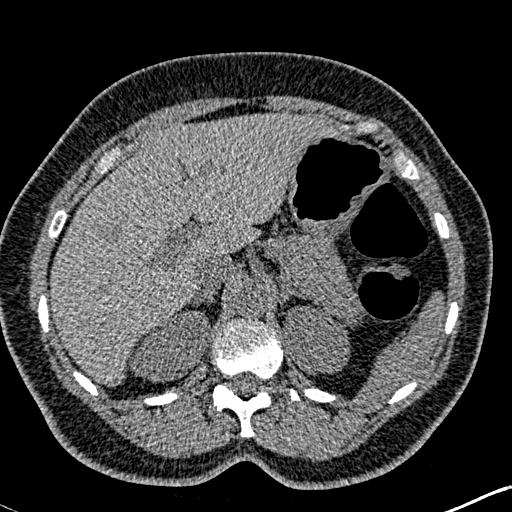
[im 18/342  lung]
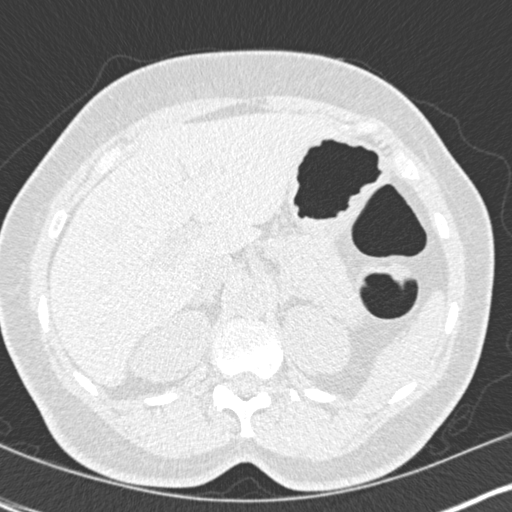
[im 54/342  lung]
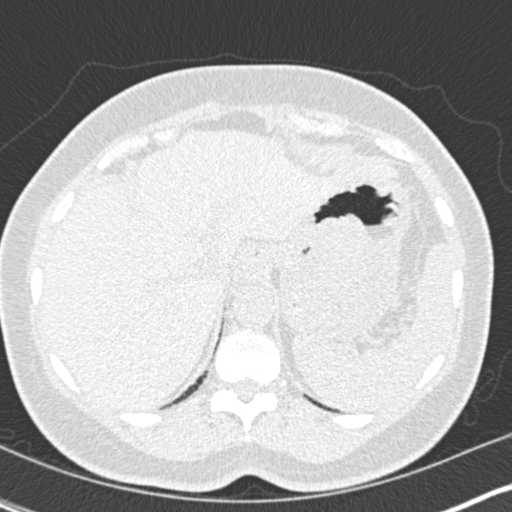
[im 72/342  lung]
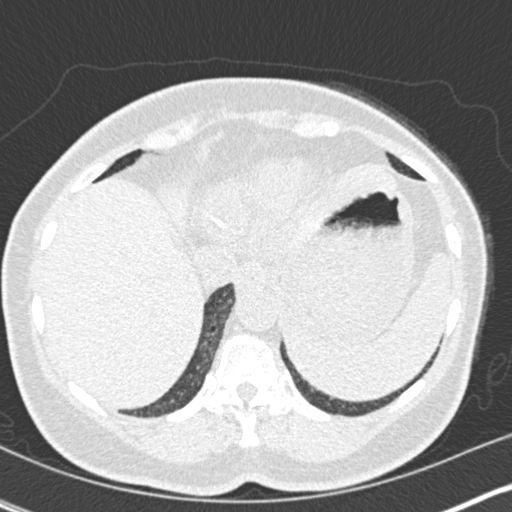
[im 108/342  lung]
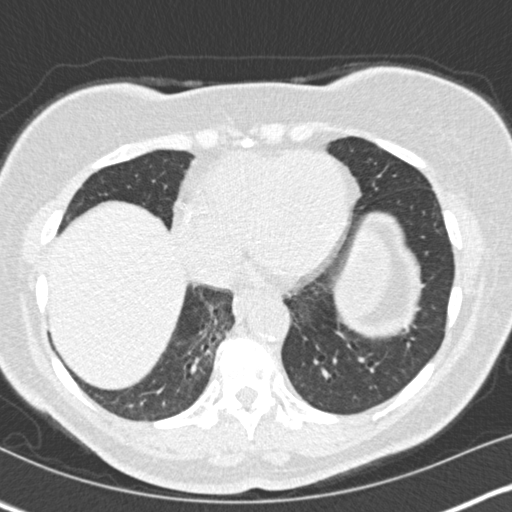
[im 126/342  mediastinal]
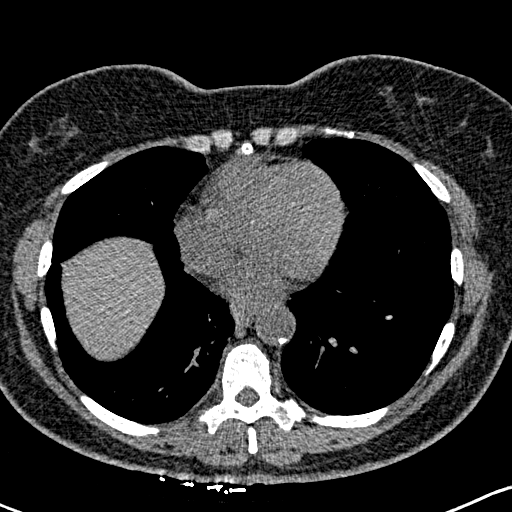
[im 126/342  lung]
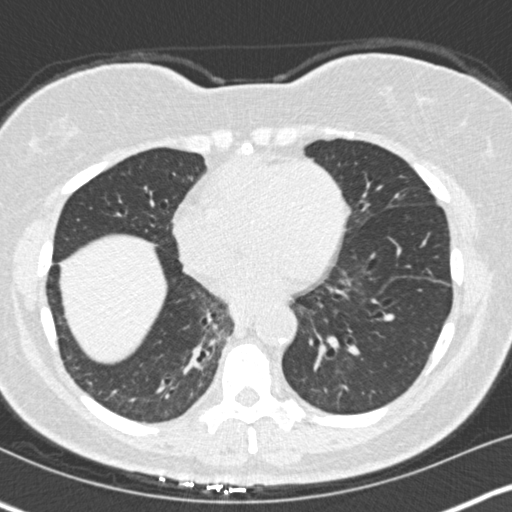
[im 162/342  lung]
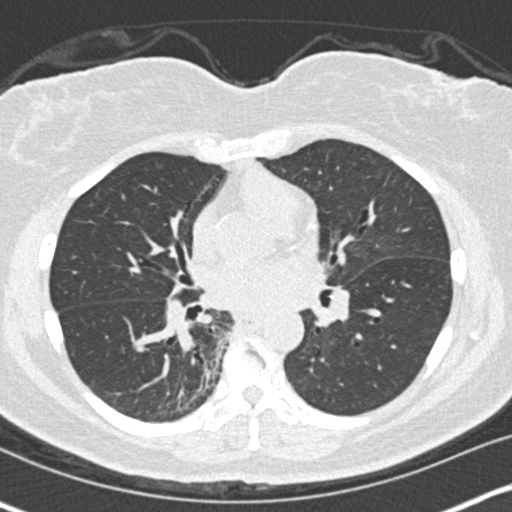
[im 180/342  lung]
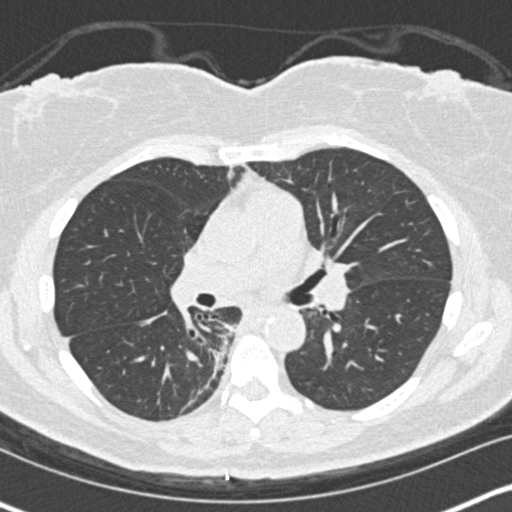
[im 216/342  lung]
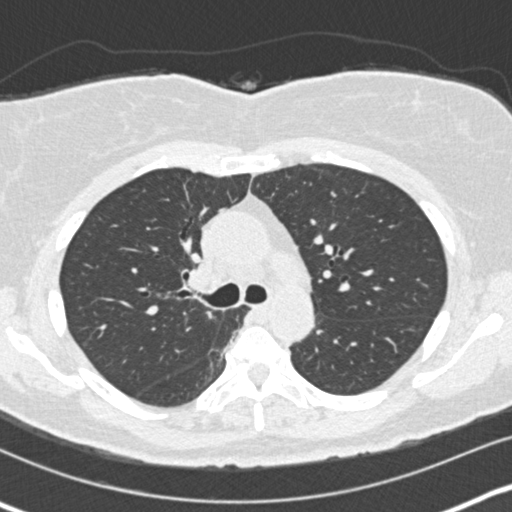
[im 234/342  mediastinal]
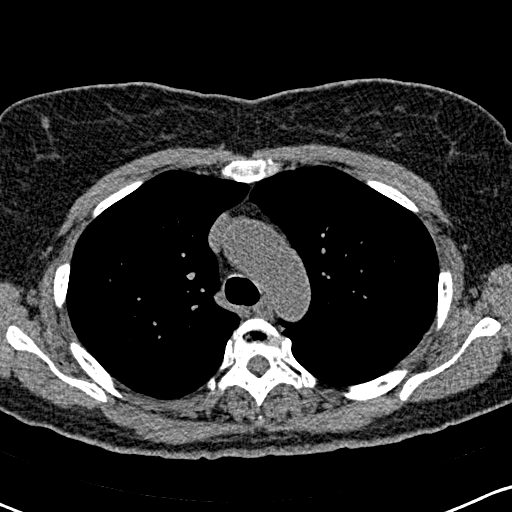
[im 234/342  lung]
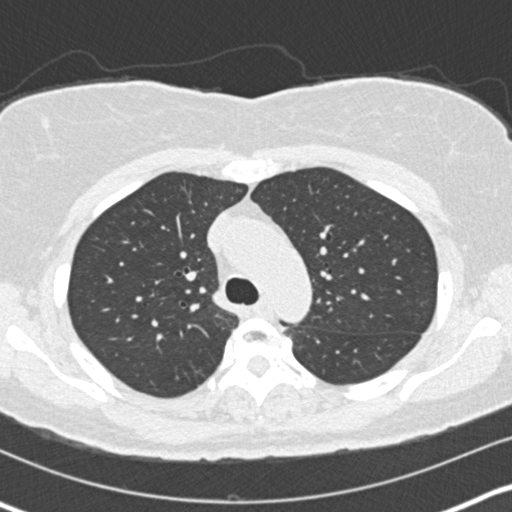
[im 270/342  lung]
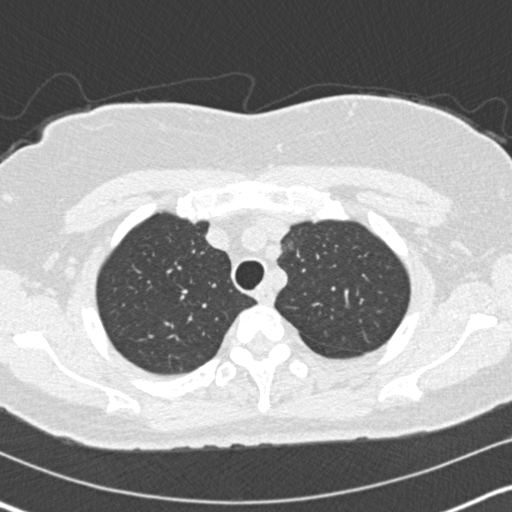
[im 288/342  lung]
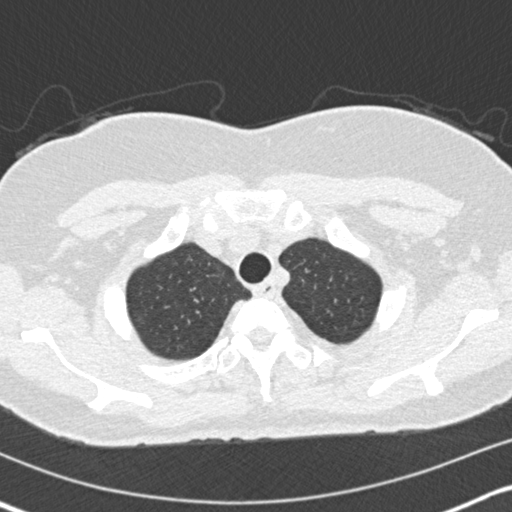
[im 324/342  lung]
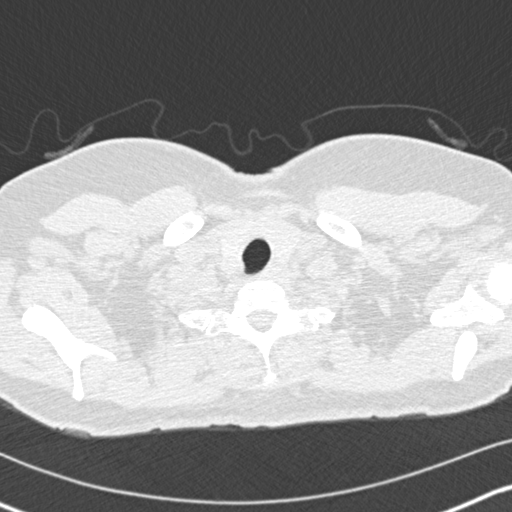

[Series 9: hi res chest 2.0 spo · coronal · 0.59mm/px · 3 of 121 slices shown]
[im 25/121  lung]
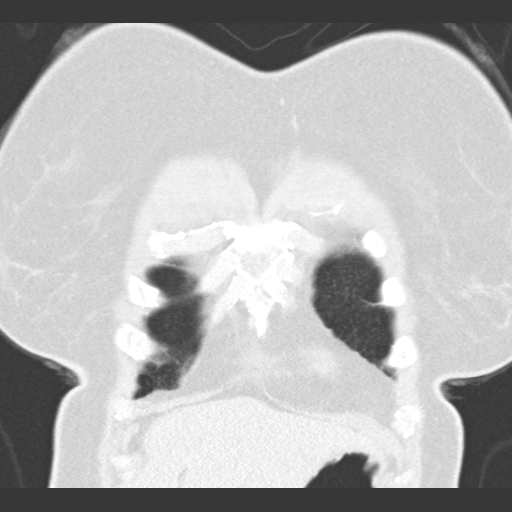
[im 49/121  lung]
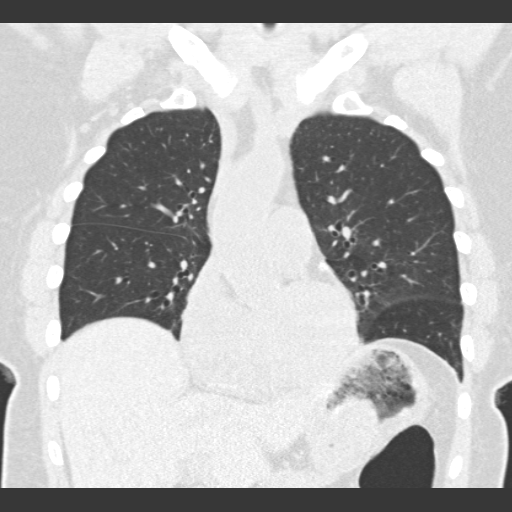
[im 73/121  lung]
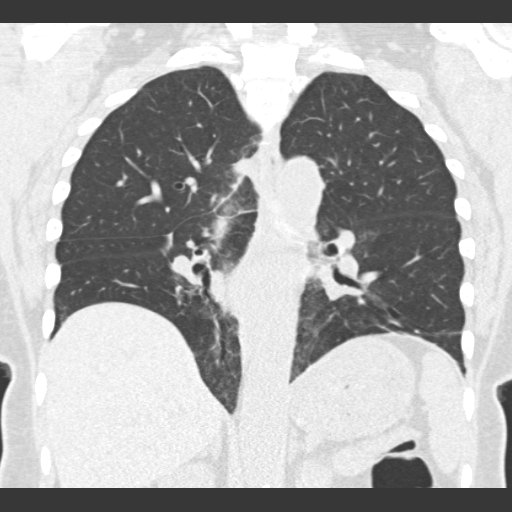

[15 of 36 positions shown; findings below may reference images not displayed]

FINDINGS: Mediastinum/Lymph Nodes: 11 mm low-attenuation lesion off the
posterior right thyroid is noted. No pathologically enlarged
mediastinal lymph nodes. Hilar regions are difficult to definitively
evaluate without IV contrast. No axillary adenopathy. Heart size
normal. Three-vessel coronary artery calcification. No pericardial
effusion.

Lungs/Pleura: A few scattered pulmonary nodules measure up to 9 mm
(8 x 9 mm) nodule in the inferior lingula (series 5, image 86). Mild
ground-glass, minimal bronchiectasis and mild architectural
distortion in the lingula and both lower lobes likely due to
scarring. No definitive subpleural reticulation, traction
bronchiectasis/bronchiolectasis or honeycombing. No pleural fluid.
Airway is unremarkable. Mild air trapping.

Upper abdomen: Visualized portions of the liver, adrenal glands,
kidneys, spleen, pancreas, stomach and bowel are grossly
unremarkable. No upper abdominal adenopathy.

Musculoskeletal: No worrisome lytic or sclerotic lesions.
IMPRESSION: 1. No definitive evidence of interstitial lung disease. Favor
bibasilar pulmonary parenchymal scarring.
2. Scattered pulmonary nodules measure up to 9 mm in the lingula.
Non-contrast chest CT at 3-6 months is recommended. If the nodules
are stable at time of repeat CT, then future CT at 18-24 months
(from today's scan) is considered optional for low-risk patients,
but is recommended for high-risk patients. This recommendation
follows the consensus statement: Guidelines for Management of
Incidental Pulmonary Nodules Detected on CT Images:From the
[HOSPITAL] 5571; published online before print
(10.1148/radiol.9224292990).
3. Posterior right thyroid nodule. Consider further evaluation with
thyroid ultrasound. If patient is clinically hyperthyroid, consider
nuclear medicine thyroid uptake and scan.
4. Three-vessel coronary artery calcification.

## 2017-11-03 ENCOUNTER — Other Ambulatory Visit (HOSPITAL_COMMUNITY): Payer: Self-pay | Admitting: Adult Health

## 2017-11-03 DIAGNOSIS — D509 Iron deficiency anemia, unspecified: Secondary | ICD-10-CM

## 2017-11-25 DIAGNOSIS — F518 Other sleep disorders not due to a substance or known physiological condition: Secondary | ICD-10-CM | POA: Diagnosis not present

## 2017-11-25 DIAGNOSIS — E119 Type 2 diabetes mellitus without complications: Secondary | ICD-10-CM | POA: Diagnosis not present

## 2017-11-25 DIAGNOSIS — E782 Mixed hyperlipidemia: Secondary | ICD-10-CM | POA: Diagnosis not present

## 2017-11-25 DIAGNOSIS — I119 Hypertensive heart disease without heart failure: Secondary | ICD-10-CM | POA: Diagnosis not present

## 2017-11-30 ENCOUNTER — Inpatient Hospital Stay (HOSPITAL_COMMUNITY): Payer: PPO | Attending: Hematology

## 2017-11-30 DIAGNOSIS — E782 Mixed hyperlipidemia: Secondary | ICD-10-CM | POA: Diagnosis not present

## 2017-11-30 DIAGNOSIS — Z87891 Personal history of nicotine dependence: Secondary | ICD-10-CM | POA: Diagnosis not present

## 2017-11-30 DIAGNOSIS — K5900 Constipation, unspecified: Secondary | ICD-10-CM | POA: Diagnosis not present

## 2017-11-30 DIAGNOSIS — E119 Type 2 diabetes mellitus without complications: Secondary | ICD-10-CM | POA: Diagnosis not present

## 2017-11-30 DIAGNOSIS — D509 Iron deficiency anemia, unspecified: Secondary | ICD-10-CM | POA: Diagnosis not present

## 2017-11-30 DIAGNOSIS — I10 Essential (primary) hypertension: Secondary | ICD-10-CM | POA: Diagnosis not present

## 2017-11-30 DIAGNOSIS — L03114 Cellulitis of left upper limb: Secondary | ICD-10-CM | POA: Diagnosis not present

## 2017-11-30 DIAGNOSIS — I119 Hypertensive heart disease without heart failure: Secondary | ICD-10-CM | POA: Diagnosis not present

## 2017-11-30 LAB — CBC WITH DIFFERENTIAL/PLATELET
BASOS PCT: 0 %
Basophils Absolute: 0 10*3/uL (ref 0.0–0.1)
EOS ABS: 0.5 10*3/uL (ref 0.0–0.7)
Eosinophils Relative: 5 %
HEMATOCRIT: 40.9 % (ref 36.0–46.0)
HEMOGLOBIN: 12.8 g/dL (ref 12.0–15.0)
LYMPHS ABS: 1.3 10*3/uL (ref 0.7–4.0)
Lymphocytes Relative: 12 %
MCH: 24.4 pg — ABNORMAL LOW (ref 26.0–34.0)
MCHC: 31.3 g/dL (ref 30.0–36.0)
MCV: 78.1 fL (ref 78.0–100.0)
MONOS PCT: 9 %
Monocytes Absolute: 0.9 10*3/uL (ref 0.1–1.0)
NEUTROS PCT: 74 %
Neutro Abs: 7.7 10*3/uL (ref 1.7–7.7)
Platelets: 267 10*3/uL (ref 150–400)
RBC: 5.24 MIL/uL — AB (ref 3.87–5.11)
RDW: 15.5 % (ref 11.5–15.5)
WBC: 10.4 10*3/uL (ref 4.0–10.5)

## 2017-11-30 LAB — IRON AND TIBC
Iron: 31 ug/dL (ref 28–170)
SATURATION RATIOS: 11 % (ref 10.4–31.8)
TIBC: 283 ug/dL (ref 250–450)
UIBC: 252 ug/dL

## 2017-11-30 LAB — COMPREHENSIVE METABOLIC PANEL
ALBUMIN: 3.9 g/dL (ref 3.5–5.0)
ALK PHOS: 72 U/L (ref 38–126)
ALT: 14 U/L (ref 0–44)
AST: 21 U/L (ref 15–41)
Anion gap: 8 (ref 5–15)
BILIRUBIN TOTAL: 0.6 mg/dL (ref 0.3–1.2)
BUN: 11 mg/dL (ref 8–23)
CALCIUM: 9.1 mg/dL (ref 8.9–10.3)
CO2: 28 mmol/L (ref 22–32)
Chloride: 105 mmol/L (ref 98–111)
Creatinine, Ser: 0.82 mg/dL (ref 0.44–1.00)
GFR calc Af Amer: >60 mL/min (ref >60)
GFR calc non Af Amer: >60 mL/min (ref >60)
GLUCOSE: 113 mg/dL — AB (ref 70–99)
Potassium: 3.9 mmol/L (ref 3.5–5.1)
SODIUM: 141 mmol/L (ref 135–145)
TOTAL PROTEIN: 7.2 g/dL (ref 6.5–8.1)

## 2017-11-30 LAB — FERRITIN: Ferritin: 100 ng/mL (ref 11–307)

## 2017-12-01 DIAGNOSIS — L723 Sebaceous cyst: Secondary | ICD-10-CM | POA: Diagnosis not present

## 2017-12-01 DIAGNOSIS — L089 Local infection of the skin and subcutaneous tissue, unspecified: Secondary | ICD-10-CM | POA: Diagnosis not present

## 2017-12-07 ENCOUNTER — Inpatient Hospital Stay (HOSPITAL_COMMUNITY): Payer: PPO | Admitting: Internal Medicine

## 2017-12-16 ENCOUNTER — Inpatient Hospital Stay (HOSPITAL_COMMUNITY): Payer: PPO | Admitting: Internal Medicine

## 2017-12-16 ENCOUNTER — Encounter (HOSPITAL_COMMUNITY): Payer: Self-pay | Admitting: Internal Medicine

## 2017-12-16 VITALS — BP 143/76 | HR 88 | Temp 98.1°F | Resp 14 | Wt 165.9 lb

## 2017-12-16 DIAGNOSIS — I10 Essential (primary) hypertension: Secondary | ICD-10-CM | POA: Diagnosis not present

## 2017-12-16 DIAGNOSIS — D509 Iron deficiency anemia, unspecified: Secondary | ICD-10-CM | POA: Diagnosis not present

## 2017-12-16 DIAGNOSIS — Z87891 Personal history of nicotine dependence: Secondary | ICD-10-CM

## 2017-12-16 DIAGNOSIS — D500 Iron deficiency anemia secondary to blood loss (chronic): Secondary | ICD-10-CM

## 2017-12-16 DIAGNOSIS — K5900 Constipation, unspecified: Secondary | ICD-10-CM

## 2017-12-16 NOTE — Patient Instructions (Signed)
[facility] at [facility] Discharge Instructions  You saw Dr. Higgs today.   Thank you for choosing [facility] at [facility] to provide your oncology and hematology care.  To afford each patient quality time with our provider, please arrive at least 15 minutes before your scheduled appointment time.   If you have a lab appointment with the Cancer Center please come in thru the  Main Entrance and check in at the main information desk  You need to re-schedule your appointment should you arrive 10 or more minutes late.  We strive to give you quality time with our providers, and arriving late affects you and other patients whose appointments are after yours.  Also, if you no show three or more times for appointments you may be dismissed from the clinic at the providers discretion.     Again, thank you for choosing [facility].  Our hope is that these requests will decrease the amount of time that you wait before being seen by our physicians.       _____________________________________________________________  Should you have questions after your visit to [facility], please contact our office at [phone] between the hours of 8:30 a.m. and 4:30 p.m.  Voicemails left after 4:30 p.m. will not be returned until the following business day.  For prescription refill requests, have your pharmacy contact our office.       Resources For Cancer Patients and their Caregivers ? American Cancer Society: Can assist with transportation, wigs, general needs, runs Look Good Feel Better.        1-888-227-6333 ? Cancer Care: Provides financial assistance, online support groups, medication/co-pay assistance.  1-800-813-HOPE (4673) ? [facility] Assists [place] cancer patients and their families through emotional , educational and financial support.  336-427-4357 ? [place] DSS Where to apply for food  stamps, Medicaid and utility assistance. 336-342-1394 ? [facility]: Transportation to medical appointments. 336-347-2287 ? Social Security Administration: May apply for disability if have a Stage IV cancer. 336-342-7796 1-800-772-1213 ? [place] Aging, Disability and Transit Services: Assists with nutrition, care and transit needs. 336-349-2343  Cancer Center Support Programs:   > Cancer Support Group  2nd Tuesday of the month 1pm-2pm, Journey Room   > Creative Journey  3rd Tuesday of the month 1130am-1pm, Journey Room     

## 2017-12-16 NOTE — Progress Notes (Signed)
Diagnosis Iron deficiency anemia due to chronic blood loss - Plan: CBC with Differential/Platelet, Comprehensive metabolic panel, Lactate dehydrogenase, Ferritin  Staging Cancer Staging No matching staging information was found for the patient.  Assessment and Plan:  1.  Microcytic anemia.  Felt secondary to thalassemia minor.   Pt has no M-spike.  or gammopathy.   Labs done 11/30/2017 reviewed with pt and showed HB 12.8.  Ferritin 100.  Pt on oral iron.  She will RTC in 04/2018 for follow-up and repeat labs.    2.  HTN.  BP is 143/76.  Follow-up with PCP.   3  Constipation.  Stool softeners recommended.  Follow-up with GI as recommended.    4  Health maintenance.  Pt has undergone GI evaluation.  Continue follow-up as recommended as well as mammogram screening.    Current Status:  Pt is seen today for follow-up.  She is here to go over labs.     Problem List Patient Active Problem List   Diagnosis Date Noted  . Diarrhea [R19.7] 08/10/2017  . Constipation [K59.00] 02/10/2017  . AVM (arteriovenous malformation) of small bowel, acquired [K55.20] 02/10/2017  . Iron deficiency anemia due to chronic blood loss [D50.0] 07/23/2016  . Gastritis and gastroduodenitis [K29.70, K56.90]   . Symptomatic anemia [D64.9] 02/29/2016  . Weight loss [R63.4] 04/01/2012  . Microcytic anemia [D50.9] 01/31/2010  . Cerebrovascular disease [I67.9] 01/31/2010  . Arteriosclerotic cardiovascular disease (ASCVD) [I25.10] 01/03/2009  . HYPERLIPIDEMIA [E78.5] 10/22/2007  . Essential hypertension [I10] 10/22/2007    Past Medical History Past Medical History:  Diagnosis Date  . Arteriosclerotic cardiovascular disease (ASCVD) 2001   Bare-metal stent placed in the right coronary artery in 12/01; residual 50% lesion of the first diagonal and mid LAD  . Cerebrovascular disease   . Diabetes mellitus    excellent control with a low-dose of a single oral agent  . GERD (gastroesophageal reflux disease)   .  History of right coronary artery stent placement 2000  . Hyperlipidemia   . Hypertension   . Thalassemia minor   . Tobacco abuse, in remission    35 pack years; Quit in 1980    Past Surgical History Past Surgical History:  Procedure Laterality Date  . COLONOSCOPY  2006   Dr. Collene Mares: normal  . COLONOSCOPY  2000   Dr. Everlean Cherry: normal   . COLONOSCOPY N/A 03/03/2016   Dr. Oneida Alar: diverticulosis, non-bleeding hemorrhoids, redundant left colon.  Marland Kitchen DILATION AND CURETTAGE OF UTERUS    . ESOPHAGOGASTRODUODENOSCOPY  2000   Dr. Everlean Cherry: gastritis   . ESOPHAGOGASTRODUODENOSCOPY  2006   Dr. Collene Mares: small hiatal hernia, gastritis, negative H.pylori   . ESOPHAGOGASTRODUODENOSCOPY N/A 03/03/2016   Procedure: ESOPHAGOGASTRODUODENOSCOPY (EGD);  Surgeon: Danie Binder, MD;  Location: [facility];  Service: Endoscopy;  Laterality: N/A;  . ESOPHAGOGASTRODUODENOSCOPY N/A 06/03/2016   Dr. Oneida Alar: nonaggressive gastritis due to aspirin use. Previous ulcers had healed.  . INGUINAL HERNIA REPAIR  1970s   Right  . VAGINAL HYSTERECTOMY  1980   Unilateral oophorectomy    Family History Family History  Problem Relation Age of Onset  . Diabetes Sister   . Heart disease Mother        also hypertension and asthma  . Cancer Mother   . Colon cancer Neg Hx   . Colon polyps Neg Hx      Social History  reports that she quit smoking about 37 years ago. Her smoking use included cigarettes. She started smoking about 52 years ago.  She has a 18.00 pack-year smoking history. She has never used smokeless tobacco. She reports that she does not drink alcohol or use drugs.  Medications  Current Outpatient Medications:  .  amLODipine (NORVASC) 10 MG tablet, TAKE ONE TABLET BY MOUTH ONCE DAILY, Disp: 90 tablet, Rfl: 2 .  aspirin 81 MG tablet, Take 81 mg by mouth daily.  , Disp: , Rfl:  .  bimatoprost (LUMIGAN) 0.01 % SOLN, Place 1 drop into both eyes at bedtime., Disp: , Rfl:  .  carvedilol (COREG) 6.25 MG tablet, Take  6.25 mg by mouth 2 (two) times daily., Disp: , Rfl:  .  doxycycline (VIBRAMYCIN) 100 MG capsule, Take 100 mg by mouth 2 (two) times daily., Disp: , Rfl: 0 .  hydroxypropyl methylcellulose / hypromellose (ISOPTO TEARS / GONIOVISC) 2.5 % ophthalmic solution, Place 1 drop into both eyes 3 (three) times daily as needed for dry eyes., Disp: , Rfl:  .  hydrOXYzine (ATARAX/VISTARIL) 25 MG tablet, Take 25 mg by mouth every 6 (six) hours as needed., Disp: , Rfl:  .  irbesartan (AVAPRO) 150 MG tablet, Take 1 tablet (150 mg total) by mouth daily., Disp: 90 tablet, Rfl: 3 .  lansoprazole (PREVACID) 30 MG capsule, Take 1 capsule (30 mg total) by mouth daily before breakfast., Disp: 30 capsule, Rfl: 11 .  metFORMIN (GLUCOPHAGE) 500 MG tablet, Take 500 mg by mouth daily with breakfast., Disp: , Rfl:  .  POLY-IRON 150 FORTE 150-25-1 MG-MCG-MG CAPS, TAKE 1 CAPSULE BY MOUTH ONCE DAILY, Disp: 90 each, Rfl: 1 .  rosuvastatin (CRESTOR) 20 MG tablet, Take 1 tablet (20 mg total) by mouth daily., Disp: 90 tablet, Rfl: 3 .  traMADol (ULTRAM) 50 MG tablet, Take 50 mg by mouth every 6 (six) hours as needed. for pain, Disp: , Rfl: 0  Allergies Protonix [pantoprazole sodium] and Shellfish allergy  Review of Systems Review of Systems - Oncology ROS negative other than constipation.     Physical Exam  Vitals Wt Readings from Last 3 Encounters:  12/16/17 165 lb 14.4 oz (75.3 kg)  08/10/17 169 lb 3.2 oz (76.7 kg)  07/31/17 170 lb (77.1 kg)   Temp Readings from Last 3 Encounters:  12/16/17 98.1 F (36.7 C) (Oral)  08/10/17 97.9 F (36.6 C) (Oral)  06/09/17 97.6 F (36.4 C) (Oral)   BP Readings from Last 3 Encounters:  12/16/17 (!) 143/76  08/29/17 133/78  08/10/17 137/88   Pulse Readings from Last 3 Encounters:  12/16/17 88  08/29/17 82  08/10/17 77   Constitutional: Well-developed, well-nourished, and in no distress.   HENT: Head: Normocephalic and atraumatic.  Mouth/Throat: No oropharyngeal exudate.  Mucosa moist. Eyes: Pupils are equal, round, and reactive to light. Conjunctivae are normal. No scleral icterus.  Neck: Normal range of motion. Neck supple. No JVD present.  Cardiovascular: Normal rate, regular rhythm and normal heart sounds.  Exam reveals no gallop and no friction rub.   No murmur heard. Pulmonary/Chest: Effort normal and breath sounds normal. No respiratory distress. No wheezes.No rales.  Abdominal: Soft. Bowel sounds are normal. No distension. There is no tenderness. There is no guarding.  Musculoskeletal: No edema or tenderness.  Lymphadenopathy: No cervical, axillary or supraclavicular adenopathy.  Neurological: Alert and oriented to person, place, and time. No cranial nerve deficit.  Skin: Skin is warm and dry. No rash noted. No erythema. No pallor.  Psychiatric: Affect and judgment normal.   Labs No visits with results within 3 Day(s) from this visit.  Latest  known visit with results is:  Appointment on 11/30/2017  Component Date Value Ref Range Status  . Ferritin 11/30/2017 100  11 - 307 ng/mL Final   Performed at [facility], [place] [address] [place], [place] [address]  . WBC 11/30/2017 10.4  4.0 - 10.5 K/uL Final  . RBC 11/30/2017 5.24* 3.87 - 5.11 MIL/uL Final  . Hemoglobin 11/30/2017 12.8  12.0 - 15.0 g/dL Final  . HCT 11/30/2017 40.9  36.0 - 46.0 % Final  . MCV 11/30/2017 78.1  78.0 - 100.0 fL Final  . MCH 11/30/2017 24.4* 26.0 - 34.0 pg Final  . MCHC 11/30/2017 31.3  30.0 - 36.0 g/dL Final  . RDW 11/30/2017 15.5  11.5 - 15.5 % Final  . Platelets 11/30/2017 267  150 - 400 K/uL Final  . Neutrophils Relative % 11/30/2017 74  % Final  . Neutro Abs 11/30/2017 7.7  1.7 - 7.7 K/uL Final  . Lymphocytes Relative 11/30/2017 12  % Final  . Lymphs Abs 11/30/2017 1.3  0.7 - 4.0 K/uL Final  . Monocytes Relative 11/30/2017 9  % Final  . Monocytes Absolute 11/30/2017 0.9  0.1 - 1.0 K/uL Final  . Eosinophils Relative 11/30/2017 5  % Final  . Eosinophils  Absolute 11/30/2017 0.5  0.0 - 0.7 K/uL Final  . Basophils Relative 11/30/2017 0  % Final  . Basophils Absolute 11/30/2017 0.0  0.0 - 0.1 K/uL Final   Performed at [facility], [address], [place], [place] [address]  . Sodium 11/30/2017 141  135 - 145 mmol/L Final  . Potassium 11/30/2017 3.9  3.5 - 5.1 mmol/L Final  . Chloride 11/30/2017 105  98 - 111 mmol/L Final   Please note change in reference range.  . CO2 11/30/2017 28  22 - 32 mmol/L Final  . Glucose, Bld 11/30/2017 113* 70 - 99 mg/dL Final   Please note change in reference range.  . BUN 11/30/2017 11  8 - 23 mg/dL Final   Please note change in reference range.  . Creatinine, Ser 11/30/2017 0.82  0.44 - 1.00 mg/dL Final  . Calcium 11/30/2017 9.1  8.9 - 10.3 mg/dL Final  . Total Protein 11/30/2017 7.2  6.5 - 8.1 g/dL Final  . Albumin 11/30/2017 3.9  3.5 - 5.0 g/dL Final  . AST 11/30/2017 21  15 - 41 U/L Final  . ALT 11/30/2017 14  0 - 44 U/L Final   Please note change in reference range.  . Alkaline Phosphatase 11/30/2017 72  38 - 126 U/L Final  . Total Bilirubin 11/30/2017 0.6  0.3 - 1.2 mg/dL Final  . GFR calc non Af Amer 11/30/2017 >60  >60 mL/min Final  . GFR calc Af Amer 11/30/2017 >60  >60 mL/min Final   Comment: (NOTE) The eGFR has been calculated using the CKD EPI equation. This calculation has not been validated in all clinical situations. eGFR's persistently <60 mL/min signify possible Chronic Kidney Disease.   Georgiann Hahn gap 11/30/2017 8  5 - 15 Final   Performed at [facility], [address], [place], [place] [address]  . Iron 11/30/2017 31  28 - 170 ug/dL Final  . TIBC 11/30/2017 283  250 - 450 ug/dL Final  . Saturation Ratios 11/30/2017 11  10.4 - 31.8 % Final  . UIBC 11/30/2017 252  ug/dL Final   Performed at [facility], [place] [address], [place], [place] [address]     Pathology Orders Placed This Encounter  Procedures  .  CBC with Differential/Platelet    Standing Status:   Future    Standing  Expiration Date:   12/17/2018  . Comprehensive metabolic panel    Standing Status:   Future    Standing Expiration Date:   12/17/2018  . Lactate dehydrogenase    Standing Status:   Future    Standing Expiration Date:   12/17/2018  . Ferritin    Standing Status:   Future    Standing Expiration Date:   12/17/2018       Zoila Shutter MD

## 2017-12-22 DIAGNOSIS — L723 Sebaceous cyst: Secondary | ICD-10-CM | POA: Diagnosis not present

## 2017-12-22 DIAGNOSIS — H2513 Age-related nuclear cataract, bilateral: Secondary | ICD-10-CM | POA: Diagnosis not present

## 2017-12-22 DIAGNOSIS — L089 Local infection of the skin and subcutaneous tissue, unspecified: Secondary | ICD-10-CM | POA: Diagnosis not present

## 2017-12-22 DIAGNOSIS — H401131 Primary open-angle glaucoma, bilateral, mild stage: Secondary | ICD-10-CM | POA: Diagnosis not present

## 2017-12-22 DIAGNOSIS — E119 Type 2 diabetes mellitus without complications: Secondary | ICD-10-CM | POA: Diagnosis not present

## 2017-12-24 DIAGNOSIS — L723 Sebaceous cyst: Secondary | ICD-10-CM | POA: Diagnosis not present

## 2017-12-24 DIAGNOSIS — Z6829 Body mass index (BMI) 29.0-29.9, adult: Secondary | ICD-10-CM | POA: Diagnosis not present

## 2018-01-01 ENCOUNTER — Other Ambulatory Visit: Payer: Self-pay | Admitting: Cardiology

## 2018-01-06 DIAGNOSIS — L723 Sebaceous cyst: Secondary | ICD-10-CM | POA: Diagnosis not present

## 2018-01-06 DIAGNOSIS — I119 Hypertensive heart disease without heart failure: Secondary | ICD-10-CM | POA: Diagnosis not present

## 2018-01-06 DIAGNOSIS — E782 Mixed hyperlipidemia: Secondary | ICD-10-CM | POA: Diagnosis not present

## 2018-01-07 ENCOUNTER — Encounter: Payer: Self-pay | Admitting: Gastroenterology

## 2018-01-18 ENCOUNTER — Other Ambulatory Visit: Payer: Self-pay | Admitting: Cardiology

## 2018-01-20 ENCOUNTER — Encounter: Payer: Self-pay | Admitting: Gastroenterology

## 2018-01-20 ENCOUNTER — Ambulatory Visit: Payer: PPO | Admitting: Gastroenterology

## 2018-01-20 DIAGNOSIS — R197 Diarrhea, unspecified: Secondary | ICD-10-CM | POA: Diagnosis not present

## 2018-01-20 DIAGNOSIS — K5520 Angiodysplasia of colon without hemorrhage: Secondary | ICD-10-CM | POA: Diagnosis not present

## 2018-01-20 DIAGNOSIS — K5901 Slow transit constipation: Secondary | ICD-10-CM | POA: Diagnosis not present

## 2018-01-20 NOTE — Patient Instructions (Addendum)
CHECK BLOOD COUNT AND IRON STORES(FERRITIN) AT LEAST ONCE A YEAR.   PLEASE CALL WITH QUESTIONS OR CONCERNS.  FOLLOW UP IN 1 YEAR.

## 2018-01-20 NOTE — Assessment & Plan Note (Signed)
SYMPTOMS FAIRLY WELL CONTROLLED.   MIRALAX PRN. EAT FIBER FOLLOW UP IN 6 MOS.

## 2018-01-20 NOTE — Assessment & Plan Note (Signed)
SYMPTOMS CONTROLLED/RESOLVED.  CONTINUE TO MONITOR SYMPTOMS. 

## 2018-01-20 NOTE — Progress Notes (Signed)
   Subjective:    Patient ID: Nancy Liu, female    DOB: 07-22-1940, 77 y.o.   MRN: [id number]  HPI Last seen in ofc for loose stools and now that has resolved. BMs: Q3 DAYS(USU. 2 BMs A WEEK). USES MIRALAX IF NEEDED TO PREVENT CONSTIPATION. RUNNING ERRANDS FOR HER COMMUNITY. WATCHES SOAPS AND CURRENTLY LOVES BOLD & BEAUTIFUL.   PT DENIES FEVER, CHILLS, HEMATOCHEZIA, HEMATEMESIS, nausea, vomiting, melena, diarrhea, CHEST PAIN, SHORTNESS OF BREATH,  CHANGE IN BOWEL IN HABITS, abdominal pain, problems swallowing, OR heartburn or indigestion.  Past Medical History:  Diagnosis Date  . Arteriosclerotic cardiovascular disease (ASCVD) 2001   Bare-metal stent placed in the right coronary artery in 12/01; residual 50% lesion of the first diagonal and mid LAD  . Cerebrovascular disease   . Diabetes mellitus    excellent control with a low-dose of a single oral agent  . GERD (gastroesophageal reflux disease)   . History of right coronary artery stent placement 2000  . Hyperlipidemia   . Hypertension   . Thalassemia minor   . Tobacco abuse, in remission    35 pack years; Quit in 1980   Past Surgical History:  Procedure Laterality Date  . COLONOSCOPY  2006   Dr. Collene Mares: normal  . COLONOSCOPY  2000   Dr. Everlean Cherry: normal   . COLONOSCOPY N/A 03/03/2016   Dr. Oneida Alar: diverticulosis, non-bleeding hemorrhoids, redundant left colon.  Marland Kitchen DILATION AND CURETTAGE OF UTERUS    . ESOPHAGOGASTRODUODENOSCOPY  2000   Dr. Everlean Cherry: gastritis   . ESOPHAGOGASTRODUODENOSCOPY  2006   Dr. Collene Mares: small hiatal hernia, gastritis, negative H.pylori   . ESOPHAGOGASTRODUODENOSCOPY N/A 03/03/2016   Procedure: ESOPHAGOGASTRODUODENOSCOPY (EGD);  Surgeon: Danie Binder, MD;  Location: [facility];  Service: Endoscopy;  Laterality: N/A;  . ESOPHAGOGASTRODUODENOSCOPY N/A 06/03/2016   Dr. Oneida Alar: nonaggressive gastritis due to aspirin use. Previous ulcers had healed.  . INGUINAL HERNIA REPAIR  1970s   Right  . VAGINAL  HYSTERECTOMY  1980   Unilateral oophorectomy   Allergies  Allergen Reactions  . Protonix [Pantoprazole Sodium]     DRY LIPS  . Shellfish Allergy     Unknown reaction     Review of Systems     Objective:   Physical Exam        Assessment & Plan:

## 2018-01-20 NOTE — Assessment & Plan Note (Signed)
NO BRBPR OR MELENA.  CHECK BLOOD COUNT AND IRON STORES(FERRITIN) AT LEAST ONCE A YEAR.  PLEASE CALL WITH QUESTIONS OR CONCERNS. FOLLOW UP IN 1 YEAR.

## 2018-01-26 DIAGNOSIS — L723 Sebaceous cyst: Secondary | ICD-10-CM | POA: Diagnosis not present

## 2018-01-26 DIAGNOSIS — L089 Local infection of the skin and subcutaneous tissue, unspecified: Secondary | ICD-10-CM | POA: Diagnosis not present

## 2018-01-28 DIAGNOSIS — L03114 Cellulitis of left upper limb: Secondary | ICD-10-CM | POA: Diagnosis not present

## 2018-02-23 ENCOUNTER — Other Ambulatory Visit (HOSPITAL_COMMUNITY): Payer: Self-pay | Admitting: Family Medicine

## 2018-02-23 DIAGNOSIS — Z1231 Encounter for screening mammogram for malignant neoplasm of breast: Secondary | ICD-10-CM

## 2018-02-25 DIAGNOSIS — L089 Local infection of the skin and subcutaneous tissue, unspecified: Secondary | ICD-10-CM | POA: Diagnosis not present

## 2018-02-25 DIAGNOSIS — L723 Sebaceous cyst: Secondary | ICD-10-CM | POA: Diagnosis not present

## 2018-03-01 ENCOUNTER — Other Ambulatory Visit: Payer: Self-pay | Admitting: General Surgery

## 2018-03-01 ENCOUNTER — Other Ambulatory Visit: Payer: Self-pay | Admitting: Cardiology

## 2018-03-01 DIAGNOSIS — E119 Type 2 diabetes mellitus without complications: Secondary | ICD-10-CM | POA: Diagnosis not present

## 2018-03-01 DIAGNOSIS — I10 Essential (primary) hypertension: Secondary | ICD-10-CM | POA: Diagnosis not present

## 2018-03-01 DIAGNOSIS — L089 Local infection of the skin and subcutaneous tissue, unspecified: Secondary | ICD-10-CM | POA: Diagnosis not present

## 2018-03-01 DIAGNOSIS — Z87891 Personal history of nicotine dependence: Secondary | ICD-10-CM | POA: Diagnosis not present

## 2018-03-01 DIAGNOSIS — D563 Thalassemia minor: Secondary | ICD-10-CM | POA: Diagnosis not present

## 2018-03-01 DIAGNOSIS — Z955 Presence of coronary angioplasty implant and graft: Secondary | ICD-10-CM | POA: Diagnosis not present

## 2018-03-01 DIAGNOSIS — L723 Sebaceous cyst: Secondary | ICD-10-CM | POA: Diagnosis not present

## 2018-03-19 ENCOUNTER — Other Ambulatory Visit: Payer: Self-pay

## 2018-03-19 ENCOUNTER — Encounter (HOSPITAL_BASED_OUTPATIENT_CLINIC_OR_DEPARTMENT_OTHER): Payer: Self-pay | Admitting: *Deleted

## 2018-03-19 NOTE — Progress Notes (Signed)
Left message for Nancy Liu at Dr. Darrel Hoover office regarding cardiac clearance for pt.

## 2018-03-21 NOTE — H&P (Signed)
Madelin Rear Location: [facility] Patient #: [id number] DOB: 06/22/1940 Single / Language: Cleophus Molt / Race: Black or African American Female        History of Present Illness     The patient is a 77 year old female who presents with a complaint of infected cyst left upper arm. 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 Initially referred to our office by Dr. Criss Rosales. Seen by Puja. Seen by Dr. Hulen Skains. Now me.      She has cyst in this area for many years. She developed an infection. She underwent I&D on December 01, 2017 and urgent office packing it basically healed. On August 26 and became infected again which drained spontaneously. She has persistent purulent drainage would like this area excised.      Comorbidities include coronary artery disease, status post right coronary stent. Takes only a baby aspirin. Non-insulin-dependent diabetes. Hypertension. Hyperlipidemia. Thalassemia minor. Vaginal hysterectomy. Inguinal hernia repair. Mother deceased heart disease and cancer father deceased Social history reveals she is divorced with 3 children. 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 Quit smoking 20 years ago. Rare alcohol.  Plan:   Schedule her for wide local excision of the infected sebaceous cyst of her left upper arm under [facility] This will be closed very loosely if at all because of the active infection Cardiac clearance with Dr. Harl Bowie I discussed the indications, details, techniques, and numerous risk of the surgery with her. She is aware the risk of bleeding, infection, nerve damage with chronic pain or numbness, recurrence of the infection, and other unforeseen problems. She understands all of these issues.. All her questions are answered. She agrees with this plan.    Allergies  No Known Drug Allergies  Allergies  Reconciled   Medication History  AmLODIPine Besylate (10MG  Tablet, Oral) Active. Escitalopram Oxalate (10MG  Tablet, Oral) Active. Fluzone High-Dose (0.5ML Susp Pref Syr, Intramuscular) Active. Irbesartan-Hydrochlorothiazide (300-12.5MG  Tablet, Oral) Active. Poly-Iron 150 Forte (150-25-1MG -MCG-MG Capsule, Oral) Active. Rosuvastatin Calcium (20MG  Tablet, Oral) Active. Medications Reconciled  Vitals  Weight: 161.25 lb Height: 62in Body Surface Area: 1.74 m Body Mass Index: 29.49 kg/m  Temp.: 98.71F(Oral)  Pulse: 82 (Regular)  BP: 132/80 (Sitting, Left Arm, Standard)    Physical Exam  General Mental Status-Alert. General Appearance-Not in acute distress. Build & Nutrition-Well nourished. Posture-Normal posture. Gait-Normal.  Head and Neck Head-normocephalic, atraumatic with no lesions or palpable masses. Trachea-midline. Thyroid Gland Characteristics - normal size and consistency and no palpable nodules.  Chest and Lung Exam Chest and lung exam reveals -on auscultation, normal breath sounds, no adventitious sounds and normal vocal resonance.  Cardiovascular Cardiovascular examination reveals -normal heart sounds, regular rate and rhythm with no murmurs and femoral artery auscultation bilaterally reveals normal pulses, no bruits, no thrills.  Abdomen Inspection Inspection of the abdomen reveals - No Hernias. Palpation/Percussion Palpation and Percussion of the abdomen reveal - Soft, Non Tender, No Rigidity (guarding), No hepatosplenomegaly and No Palpable abdominal masses.  Neurologic Neurologic evaluation reveals -alert and oriented x 3 with no impairment of recent or remote memory, normal attention span and ability to concentrate, normal sensation and normal coordination.  Musculoskeletal Normal Exam - Bilateral-Upper Extremity Strength Normal and Lower Extremity Strength Normal. Note: Overlying the left triceps muscle there  is open wound about 6 mm diameter draining purulent fluid. There is some chronic thickening beneath this almost  2 cm in size. This seems limited to the skin and subcutaneous tissue.     Assessment & Plan  INFECTED SEBACEOUS CYST (L72.3)  You have a chronically infected, chronically draining epidermoid cyst of the left upper arm overlying your triceps muscle We talked about options for surgical intervention which will require elliptical excision You requested that you be sedated for this and that is reasonable  you'll be scheduled for excision of left upper arm mass under monitored sedation in the near future We will ask you cardiologist, Dr. branch, for a cardiac risk assessment and clearance for the surgery We have discussed the indications, techniques, and risk of the surgery in detail  HYPERTENSION, ESSENTIAL (I10) HISTORY OF RIGHT CORONARY ARTERY STENT PLACEMENT (Z95.5) TYPE 2 DIABETES MELLITUS TREATED WITHOUT INSULIN (E11.9) FORMER SMOKER (Z87.891) THALASSEMIA MINOR (D56.3)    Demari Kropp M. Dalbert Batman, M.D., [facility] General and Minimally invasive Surgery Breast and Colorectal Surgery Office:   [phone] Pager:   [phone]

## 2018-03-23 ENCOUNTER — Encounter (HOSPITAL_BASED_OUTPATIENT_CLINIC_OR_DEPARTMENT_OTHER)
Admission: RE | Admit: 2018-03-23 | Discharge: 2018-03-23 | Disposition: A | Discharge status: Home / Self Care | Payer: PPO | Source: Ambulatory Visit | Attending: General Surgery | Admitting: General Surgery

## 2018-03-23 DIAGNOSIS — Z87891 Personal history of nicotine dependence: Secondary | ICD-10-CM | POA: Diagnosis not present

## 2018-03-23 DIAGNOSIS — Z7984 Long term (current) use of oral hypoglycemic drugs: Secondary | ICD-10-CM | POA: Diagnosis not present

## 2018-03-23 DIAGNOSIS — E785 Hyperlipidemia, unspecified: Secondary | ICD-10-CM | POA: Diagnosis not present

## 2018-03-23 DIAGNOSIS — Z01818 Encounter for other preprocedural examination: Secondary | ICD-10-CM | POA: Insufficient documentation

## 2018-03-23 DIAGNOSIS — E119 Type 2 diabetes mellitus without complications: Secondary | ICD-10-CM | POA: Diagnosis not present

## 2018-03-23 DIAGNOSIS — D563 Thalassemia minor: Secondary | ICD-10-CM | POA: Diagnosis not present

## 2018-03-23 DIAGNOSIS — L720 Epidermal cyst: Secondary | ICD-10-CM | POA: Diagnosis not present

## 2018-03-23 DIAGNOSIS — Z79899 Other long term (current) drug therapy: Secondary | ICD-10-CM | POA: Diagnosis not present

## 2018-03-23 DIAGNOSIS — I2510 Atherosclerotic heart disease of native coronary artery without angina pectoris: Secondary | ICD-10-CM | POA: Diagnosis not present

## 2018-03-23 DIAGNOSIS — Z955 Presence of coronary angioplasty implant and graft: Secondary | ICD-10-CM | POA: Diagnosis not present

## 2018-03-23 DIAGNOSIS — I10 Essential (primary) hypertension: Secondary | ICD-10-CM | POA: Diagnosis not present

## 2018-03-23 LAB — BASIC METABOLIC PANEL
ANION GAP: 6 (ref 5–15)
BUN: 10 mg/dL (ref 8–23)
CALCIUM: 9.3 mg/dL (ref 8.9–10.3)
CO2: 28 mmol/L (ref 22–32)
Chloride: 106 mmol/L (ref 98–111)
Creatinine, Ser: 0.9 mg/dL (ref 0.44–1.00)
GFR calc non Af Amer: >60 mL/min (ref >60)
Glucose, Bld: 90 mg/dL (ref 70–99)
Potassium: 4.2 mmol/L (ref 3.5–5.1)
Sodium: 140 mmol/L (ref 135–145)

## 2018-03-23 NOTE — Progress Notes (Signed)
Ensure pre-sx drink given to patient and requested she drink by 4:00 am.  Patient verbalized instructions.

## 2018-03-25 ENCOUNTER — Encounter (HOSPITAL_BASED_OUTPATIENT_CLINIC_OR_DEPARTMENT_OTHER): Payer: Self-pay | Admitting: Anesthesiology

## 2018-03-25 NOTE — Anesthesia Preprocedure Evaluation (Addendum)
Anesthesia Evaluation  Patient identified by MRN, date of birth, ID band Patient awake    Reviewed: Allergy & Precautions, H&P , NPO status , Patient's Chart, lab work & pertinent test results, reviewed documented beta blocker date and time   Airway Mallampati: II  TM Distance: >3 FB Neck ROM: full    Dental no notable dental hx. (+) Poor Dentition, Missing, Chipped   Pulmonary neg pulmonary ROS, former smoker,    Pulmonary exam normal breath sounds clear to auscultation       Cardiovascular Exercise Tolerance: Good hypertension, negative cardio ROS   Rhythm:regular Rate:Normal     Neuro/Psych negative neurological ROS  negative psych ROS   GI/Hepatic negative GI ROS, Neg liver ROS, GERD  ,  Endo/Other  negative endocrine ROSdiabetes  Renal/GU negative Renal ROS  negative genitourinary   Musculoskeletal negative musculoskeletal ROS (+)   Abdominal   Peds  Hematology negative hematology ROS (+)   Anesthesia Other Findings - HLD  Reproductive/Obstetrics negative OB ROS                           Anesthesia Physical Anesthesia Plan  ASA: III  Anesthesia Plan: MAC   Post-op Pain Management:    Induction: Intravenous  PONV Risk Score and Plan: 3 and Treatment may vary due to age or medical condition, Propofol infusion and Ondansetron  Airway Management Planned: Simple Face Mask and Nasal Cannula  Additional Equipment: None  Intra-op Plan:   Post-operative Plan:   Informed Consent: I have reviewed the patients History and Physical, chart, labs and discussed the procedure including the risks, benefits and alternatives for the proposed anesthesia with the patient or authorized representative who has indicated his/her understanding and acceptance.   Dental Advisory Given  Plan Discussed with: CRNA  Anesthesia Plan Comments:       Anesthesia Quick Evaluation

## 2018-03-26 ENCOUNTER — Encounter (HOSPITAL_BASED_OUTPATIENT_CLINIC_OR_DEPARTMENT_OTHER)
Admission: RE | Disposition: A | Discharge status: Home / Self Care | Payer: Self-pay | Source: Ambulatory Visit | Attending: General Surgery

## 2018-03-26 ENCOUNTER — Ambulatory Visit (HOSPITAL_BASED_OUTPATIENT_CLINIC_OR_DEPARTMENT_OTHER): Payer: PPO | Admitting: Certified Registered"

## 2018-03-26 ENCOUNTER — Encounter (HOSPITAL_BASED_OUTPATIENT_CLINIC_OR_DEPARTMENT_OTHER): Payer: Self-pay | Admitting: *Deleted

## 2018-03-26 ENCOUNTER — Other Ambulatory Visit: Payer: Self-pay

## 2018-03-26 ENCOUNTER — Ambulatory Visit (HOSPITAL_BASED_OUTPATIENT_CLINIC_OR_DEPARTMENT_OTHER)
Admission: RE | Admit: 2018-03-26 | Discharge: 2018-03-26 | Disposition: A | Discharge status: Home / Self Care | Payer: PPO | Source: Ambulatory Visit | Attending: General Surgery | Admitting: General Surgery

## 2018-03-26 DIAGNOSIS — Z87891 Personal history of nicotine dependence: Secondary | ICD-10-CM | POA: Diagnosis not present

## 2018-03-26 DIAGNOSIS — E119 Type 2 diabetes mellitus without complications: Secondary | ICD-10-CM | POA: Insufficient documentation

## 2018-03-26 DIAGNOSIS — D563 Thalassemia minor: Secondary | ICD-10-CM | POA: Insufficient documentation

## 2018-03-26 DIAGNOSIS — Z7984 Long term (current) use of oral hypoglycemic drugs: Secondary | ICD-10-CM | POA: Diagnosis not present

## 2018-03-26 DIAGNOSIS — I10 Essential (primary) hypertension: Secondary | ICD-10-CM | POA: Diagnosis not present

## 2018-03-26 DIAGNOSIS — Z955 Presence of coronary angioplasty implant and graft: Secondary | ICD-10-CM | POA: Insufficient documentation

## 2018-03-26 DIAGNOSIS — E785 Hyperlipidemia, unspecified: Secondary | ICD-10-CM | POA: Insufficient documentation

## 2018-03-26 DIAGNOSIS — Z79899 Other long term (current) drug therapy: Secondary | ICD-10-CM | POA: Insufficient documentation

## 2018-03-26 DIAGNOSIS — L720 Epidermal cyst: Secondary | ICD-10-CM | POA: Diagnosis not present

## 2018-03-26 DIAGNOSIS — I2510 Atherosclerotic heart disease of native coronary artery without angina pectoris: Secondary | ICD-10-CM | POA: Diagnosis not present

## 2018-03-26 DIAGNOSIS — D367 Benign neoplasm of other specified sites: Secondary | ICD-10-CM | POA: Diagnosis present

## 2018-03-26 DIAGNOSIS — L723 Sebaceous cyst: Secondary | ICD-10-CM | POA: Diagnosis not present

## 2018-03-26 HISTORY — DX: Benign neoplasm of other specified sites: D36.7

## 2018-03-26 HISTORY — PX: EXCISION OF KELOID: SHX6267

## 2018-03-26 LAB — GLUCOSE, CAPILLARY
GLUCOSE-CAPILLARY: 78 mg/dL (ref 70–99)
GLUCOSE-CAPILLARY: 91 mg/dL (ref 70–99)

## 2018-03-26 SURGERY — EXCISION, KELOID
Anesthesia: Monitor Anesthesia Care | Laterality: Left

## 2018-03-26 MED ORDER — SODIUM BICARBONATE 4 % IV SOLN
INTRAVENOUS | Status: DC | PRN
  Administered 2018-03-26: 1 mL via INTRAVENOUS

## 2018-03-26 MED ORDER — LIDOCAINE-EPINEPHRINE (PF) 1 %-1:200000 IJ SOLN
INTRAMUSCULAR | Status: DC | PRN
  Administered 2018-03-26: 7 mL

## 2018-03-26 MED ORDER — SODIUM BICARBONATE 4 % IV SOLN
INTRAVENOUS | Status: AC
  Filled 2018-03-26: qty 5

## 2018-03-26 MED ORDER — MEPERIDINE HCL 25 MG/ML IJ SOLN: 6.2500 mg | INTRAMUSCULAR | Status: DC | PRN

## 2018-03-26 MED ORDER — ACETAMINOPHEN 500 MG PO TABS
ORAL_TABLET | ORAL | Status: AC
  Filled 2018-03-26: qty 2

## 2018-03-26 MED ORDER — FENTANYL CITRATE (PF) 100 MCG/2ML IJ SOLN
INTRAMUSCULAR | Status: AC
  Filled 2018-03-26: qty 2

## 2018-03-26 MED ORDER — FENTANYL CITRATE (PF) 100 MCG/2ML IJ SOLN: 25.0000 ug | INTRAMUSCULAR | Status: DC | PRN

## 2018-03-26 MED ORDER — GABAPENTIN 300 MG PO CAPS
ORAL_CAPSULE | ORAL | Status: AC
  Filled 2018-03-26: qty 1

## 2018-03-26 MED ORDER — GABAPENTIN 300 MG PO CAPS
300.0000 mg | ORAL_CAPSULE | ORAL | Status: AC
  Administered 2018-03-26: 300 mg via ORAL

## 2018-03-26 MED ORDER — MIDAZOLAM HCL 2 MG/2ML IJ SOLN: 1.0000 mg | INTRAMUSCULAR | Status: DC | PRN

## 2018-03-26 MED ORDER — PROPOFOL 500 MG/50ML IV EMUL
INTRAVENOUS | Status: DC | PRN
  Administered 2018-03-26: 50 ug/kg/min via INTRAVENOUS

## 2018-03-26 MED ORDER — ACETAMINOPHEN 500 MG PO TABS
1000.0000 mg | ORAL_TABLET | ORAL | Status: AC
  Administered 2018-03-26: 1000 mg via ORAL

## 2018-03-26 MED ORDER — FENTANYL CITRATE (PF) 100 MCG/2ML IJ SOLN
50.0000 ug | INTRAMUSCULAR | Status: DC | PRN
  Administered 2018-03-26: 50 ug via INTRAVENOUS

## 2018-03-26 MED ORDER — CEFAZOLIN SODIUM-DEXTROSE 2-4 GM/100ML-% IV SOLN
2.0000 g | INTRAVENOUS | Status: AC
  Administered 2018-03-26: 2 g via INTRAVENOUS

## 2018-03-26 MED ORDER — BUPIVACAINE HCL (PF) 0.25 % IJ SOLN
INTRAMUSCULAR | Status: AC
  Filled 2018-03-26: qty 30

## 2018-03-26 MED ORDER — LIDOCAINE-EPINEPHRINE (PF) 1 %-1:200000 IJ SOLN
INTRAMUSCULAR | Status: AC
  Filled 2018-03-26: qty 30

## 2018-03-26 MED ORDER — CHLORHEXIDINE GLUCONATE CLOTH 2 % EX PADS: 6.0000 | MEDICATED_PAD | Freq: Once | CUTANEOUS | Status: DC

## 2018-03-26 MED ORDER — LIDOCAINE HCL (CARDIAC) PF 100 MG/5ML IV SOSY
PREFILLED_SYRINGE | INTRAVENOUS | Status: DC | PRN
  Administered 2018-03-26: 60 mg via INTRAVENOUS

## 2018-03-26 MED ORDER — LIDOCAINE HCL (PF) 1 % IJ SOLN
INTRAMUSCULAR | Status: AC
  Filled 2018-03-26: qty 30

## 2018-03-26 MED ORDER — SODIUM CHLORIDE 0.9 % IJ SOLN
INTRAMUSCULAR | Status: AC
  Filled 2018-03-26: qty 10

## 2018-03-26 MED ORDER — CEFAZOLIN SODIUM-DEXTROSE 2-4 GM/100ML-% IV SOLN
INTRAVENOUS | Status: AC
  Filled 2018-03-26: qty 100

## 2018-03-26 MED ORDER — BUPIVACAINE-EPINEPHRINE 0.25% -1:200000 IJ SOLN
INTRAMUSCULAR | Status: AC
  Filled 2018-03-26: qty 1

## 2018-03-26 MED ORDER — SCOPOLAMINE 1 MG/3DAYS TD PT72: 1.0000 | MEDICATED_PATCH | Freq: Once | TRANSDERMAL | Status: DC | PRN

## 2018-03-26 MED ORDER — METHYLENE BLUE 0.5 % INJ SOLN
INTRAVENOUS | Status: AC
  Filled 2018-03-26: qty 10

## 2018-03-26 MED ORDER — LACTATED RINGERS IV SOLN
INTRAVENOUS | Status: DC
  Administered 2018-03-26: 07:00:00 via INTRAVENOUS

## 2018-03-26 SURGICAL SUPPLY — 58 items
ADH SKN CLS APL DERMABOND .7 (GAUZE/BANDAGES/DRESSINGS)
APL SKNCLS STERI-STRIP NONHPOA (GAUZE/BANDAGES/DRESSINGS)
BANDAGE ACE 6X5 VEL STRL LF (GAUZE/BANDAGES/DRESSINGS) IMPLANT
BENZOIN TINCTURE PRP APPL 2/3 (GAUZE/BANDAGES/DRESSINGS) IMPLANT
BLADE HEX COATED 2.75 (ELECTRODE) ×3 IMPLANT
BLADE SURG 15 STRL LF DISP TIS (BLADE) ×2 IMPLANT
BLADE SURG 15 STRL SS (BLADE) ×6
CANISTER SUCT 1200ML W/VALVE (MISCELLANEOUS) ×3 IMPLANT
CHLORAPREP W/TINT 26ML (MISCELLANEOUS) ×3 IMPLANT
CLOSURE WOUND 1/2 X4 (GAUZE/BANDAGES/DRESSINGS)
COVER BACK TABLE 60X90IN (DRAPES) ×3 IMPLANT
COVER MAYO STAND STRL (DRAPES) ×3 IMPLANT
COVER WAND RF STERILE (DRAPES) IMPLANT
DECANTER SPIKE VIAL GLASS SM (MISCELLANEOUS) IMPLANT
DERMABOND ADVANCED (GAUZE/BANDAGES/DRESSINGS)
DERMABOND ADVANCED .7 DNX12 (GAUZE/BANDAGES/DRESSINGS) IMPLANT
DRAPE LAPAROTOMY 100X72 PEDS (DRAPES) ×3 IMPLANT
DRAPE LAPAROTOMY TRNSV 102X78 (DRAPE) IMPLANT
DRAPE UTILITY XL STRL (DRAPES) ×3 IMPLANT
DRSG TEGADERM 4X4.75 (GAUZE/BANDAGES/DRESSINGS) ×2 IMPLANT
ELECT REM PT RETURN 9FT ADLT (ELECTROSURGICAL) ×3
ELECTRODE REM PT RTRN 9FT ADLT (ELECTROSURGICAL) ×1 IMPLANT
GAUZE 4X4 16PLY RFD (DISPOSABLE) IMPLANT
GAUZE SPONGE 4X4 12PLY STRL LF (GAUZE/BANDAGES/DRESSINGS) IMPLANT
GLOVE EUDERMIC 7 POWDERFREE (GLOVE) ×3 IMPLANT
GOWN STRL REUS W/ TWL LRG LVL3 (GOWN DISPOSABLE) ×1 IMPLANT
GOWN STRL REUS W/ TWL XL LVL3 (GOWN DISPOSABLE) ×1 IMPLANT
GOWN STRL REUS W/TWL LRG LVL3 (GOWN DISPOSABLE) ×3
GOWN STRL REUS W/TWL XL LVL3 (GOWN DISPOSABLE) ×3
NDL HYPO 25X1 1.5 SAFETY (NEEDLE) ×1 IMPLANT
NEEDLE HYPO 22GX1.5 SAFETY (NEEDLE) IMPLANT
NEEDLE HYPO 25X1 1.5 SAFETY (NEEDLE) ×3 IMPLANT
NS IRRIG 1000ML POUR BTL (IV SOLUTION) ×3 IMPLANT
PACK BASIN DAY SURGERY FS (CUSTOM PROCEDURE TRAY) ×3 IMPLANT
PENCIL BUTTON HOLSTER BLD 10FT (ELECTRODE) ×3 IMPLANT
SHEET MEDIUM DRAPE 40X70 STRL (DRAPES) IMPLANT
SLEEVE SCD COMPRESS KNEE MED (MISCELLANEOUS) IMPLANT
SPONGE LAP 4X18 RFD (DISPOSABLE) ×3 IMPLANT
STAPLER VISISTAT 35W (STAPLE) IMPLANT
STRIP CLOSURE SKIN 1/2X4 (GAUZE/BANDAGES/DRESSINGS) IMPLANT
SUT ETHILON 4 0 PS 2 18 (SUTURE) IMPLANT
SUT MNCRL AB 4-0 PS2 18 (SUTURE) IMPLANT
SUT SILK 2 0 SH (SUTURE) ×3 IMPLANT
SUT VIC AB 2-0 SH 27 (SUTURE)
SUT VIC AB 2-0 SH 27XBRD (SUTURE) IMPLANT
SUT VIC AB 3-0 FS2 27 (SUTURE) IMPLANT
SUT VIC AB 4-0 P-3 18XBRD (SUTURE) IMPLANT
SUT VIC AB 4-0 P3 18 (SUTURE)
SUT VICRYL 3-0 CR8 SH (SUTURE) ×3 IMPLANT
SUT VICRYL 4-0 PS2 18IN ABS (SUTURE) IMPLANT
SYR 10ML LL (SYRINGE) ×3 IMPLANT
SYR BULB 3OZ (MISCELLANEOUS) IMPLANT
TAPE HYPAFIX 4 X10 (GAUZE/BANDAGES/DRESSINGS) IMPLANT
TOWEL GREEN STERILE FF (TOWEL DISPOSABLE) ×3 IMPLANT
TOWEL OR NON WOVEN STRL DISP B (DISPOSABLE) ×3 IMPLANT
TUBE CONNECTING 20'X1/4 (TUBING) ×1
TUBE CONNECTING 20X1/4 (TUBING) ×2 IMPLANT
YANKAUER SUCT BULB TIP NO VENT (SUCTIONS) ×3 IMPLANT

## 2018-03-26 NOTE — Anesthesia Procedure Notes (Signed)
Procedure Name: MAC Date/Time: 03/26/2018 7:32 AM Performed by: Signe Colt, CRNA Pre-anesthesia Checklist: Patient identified, Emergency Drugs available, Suction available, Patient being monitored and Timeout performed Patient Re-evaluated:Patient Re-evaluated prior to induction Oxygen Delivery Method: Simple face mask

## 2018-03-26 NOTE — Interval H&P Note (Signed)
History and Physical Interval Note:  03/26/2018 6:57 AM  Nancy Liu  has presented today for surgery, with the diagnosis of INFECTED SEBACEOUS CYST  The various methods of treatment have been discussed with the patient and family. After consideration of risks, benefits and other options for treatment, the patient has consented to  Procedure(s): EXCISION OF LEFT ARM MASS (Left) as a surgical intervention .  The patient's history has been reviewed, patient examined, no change in status, stable for surgery.  I have reviewed the patient's chart and labs.  Questions were answered to the patient's satisfaction.     Adin Hector

## 2018-03-26 NOTE — Op Note (Signed)
Patient Name:           Nancy Liu   Date of Surgery:        03/26/2018  Pre op Diagnosis:      2.0 cm infected epidermoid cyst left upper arm  Post op Diagnosis:    Same  Procedure:                 Excision infected epidermoid cyst left upper arm  Surgeon:                     Edsel Petrin. Dalbert Batman, M.D., FACS  Assistant:                      OR staff  Operative Indication   [name] who I am seeing for a chronically infected draining  abscess of the left upper arm. Initially referred to our office by Dr. Criss Rosales.       She has cyst in this area for many years. She developed an infection. She underwent I&D on December 01, 2017 and urgent office packing it basically healed. On August 26 and became infected again which drained spontaneously. She has persistent purulent drainage would like this area excised.      Comorbidities include coronary artery disease, status post right coronary stent. Takes only a baby aspirin. Non-insulin-dependent diabetes. Hypertension. Hyperlipidemia. Thalassemia minor. Vaginal hysterectomy. Inguinal hernia repair. She is scheduled for  wide local excision of the infected sebaceous cyst of her left upper arm [facility] This will be closed very loosely if at all because of the active infection She agrees with this plan.  Operative Findings:       On the left upper arm laterally, just below the deltoid muscle, there is a 2.0 cm mass and a central draining sinus.  The incision was 5 cm longitudinally by 2 cm transversely.  Specimen was sent to pathology  Procedure in Detail:          The patient was placed in the right lateral decubitus  position to expose the operative field.  The left upper arm was prepped and draped in a sterile fashion.  The patient was monitored and sedated by the anesthesia department.  1% Xylocaine with epinephrine was used as local infiltration anesthetic.  The incision was planned  and marked.  Longitudinal incision 5 cm x 2 cm were made.  I completely excised the mass and sent it to pathology.  Hemostasis was excellent.  The wound was irrigated.  I closed the skin loosely with 3-0 nylon leaving the central portion slightly separated to allow drainage.  This was covered with dry gauze bandage and a Tegaderm.  Patient tolerated procedure well was taken to PACU in stable condition.  EBL 10 cc or less.  Counts correct.  Complications none.   Addendum: I logged onto the [employer] and reviewed her prescription medication history     Jaedin Regina M. Dalbert Batman, M.D., FACS General and Minimally Invasive Surgery Breast and Colorectal Surgery  03/26/2018 7:53 AM

## 2018-03-26 NOTE — Discharge Instructions (Signed)
Intermittent ice pack to wound for 24 hours  Change bandage daily and as needed with dry gauze bandage No ointments or creams  The sutures will need to be removed in 12 to 14 days Be sure to call Dr. Darrel Hoover office and make an appointment to see Dr. Dalbert Batman for wound check and suture removal in 12 to 14 days  This will be a little sore but not bad. I recommend Tylenol 1000 mg every 6 hours for 18 to 24 hours and then only as needed  You may shower. After each shower place a clean dry bandage on the wound  Continue to take all of your usual medicines, exactly as you were taking them before.    Post Anesthesia Home Care Instructions  Activity: Get plenty of rest for the remainder of the day. A responsible individual must stay with you for 24 hours following the procedure.  For the next 24 hours, DO NOT: -Drive a car -Paediatric nurse -Drink alcoholic beverages -Take any medication unless instructed by your physician -Make any legal decisions or sign important papers.  Meals: Start with liquid foods such as gelatin or soup. Progress to regular foods as tolerated. Avoid greasy, spicy, heavy foods. If nausea and/or vomiting occur, drink only clear liquids until the nausea and/or vomiting subsides. Call your physician if vomiting continues.  Special Instructions/Symptoms: Your throat may feel dry or sore from the anesthesia or the breathing tube placed in your throat during surgery. If this causes discomfort, gargle with warm salt water. The discomfort should disappear within 24 hours.  If you had a scopolamine patch placed behind your ear for the management of post- operative nausea and/or vomiting:  1. The medication in the patch is effective for 72 hours, after which it should be removed.  Wrap patch in a tissue and discard in the trash. Wash hands thoroughly with soap and water. 2. You may remove the patch earlier than 72 hours if you experience unpleasant side effects which may  include dry mouth, dizziness or visual disturbances. 3. Avoid touching the patch. Wash your hands with soap and water after contact with the patch.

## 2018-03-26 NOTE — Anesthesia Postprocedure Evaluation (Signed)
Anesthesia Post Note  Patient: Nancy Liu  Procedure(s) Performed: EXCISION OF LEFT ARM MASS (Left )     Patient location during evaluation: PACU Anesthesia Type: MAC Level of consciousness: awake and alert Pain management: pain level controlled Vital Signs Assessment: post-procedure vital signs reviewed and stable Respiratory status: spontaneous breathing, nonlabored ventilation, respiratory function stable and patient connected to nasal cannula oxygen Cardiovascular status: stable and blood pressure returned to baseline Postop Assessment: no apparent nausea or vomiting Anesthetic complications: no    Last Vitals:  Vitals:   03/26/18 0830 03/26/18 0854  BP: (!) 144/80 (!) 158/85  Pulse: (!) 59 64  Resp: 17 18  Temp:  (!) 36.3 C  SpO2: 99% 99%    Last Pain:  Vitals:   03/26/18 0854  TempSrc:   PainSc: 0-No pain                 Cortni Tays

## 2018-03-26 NOTE — Transfer of Care (Signed)
Immediate Anesthesia Transfer of Care Note  Patient: Nancy Liu  Procedure(s) Performed: EXCISION OF LEFT ARM MASS (Left )  Patient Location: PACU  Anesthesia Type:MAC  Level of Consciousness: awake, alert , oriented and patient cooperative  Airway & Oxygen Therapy: Patient Spontanous Breathing and Patient connected to face mask oxygen  Post-op Assessment: Report given to RN and Post -op Vital signs reviewed and stable  Post vital signs: Reviewed and stable  Last Vitals:  Vitals Value Taken Time  BP    Temp    Pulse    Resp    SpO2      Last Pain:  Vitals:   03/26/18 0630  TempSrc: Oral  PainSc: 0-No pain      Patients Stated Pain Goal: 0 (41/63/84 [id number])  Complications: No apparent anesthesia complications

## 2018-03-29 ENCOUNTER — Ambulatory Visit (HOSPITAL_COMMUNITY): Payer: PPO

## 2018-03-29 ENCOUNTER — Encounter (HOSPITAL_BASED_OUTPATIENT_CLINIC_OR_DEPARTMENT_OTHER): Payer: Self-pay | Admitting: General Surgery

## 2018-03-30 NOTE — Progress Notes (Signed)
Inform patient of Pathology report,. The mass that I removed from her arm was a benign epidermoid cyst.

## 2018-04-13 ENCOUNTER — Inpatient Hospital Stay (HOSPITAL_COMMUNITY): Payer: PPO | Attending: Hematology

## 2018-04-13 DIAGNOSIS — Z87891 Personal history of nicotine dependence: Secondary | ICD-10-CM | POA: Diagnosis not present

## 2018-04-13 DIAGNOSIS — K5900 Constipation, unspecified: Secondary | ICD-10-CM | POA: Diagnosis not present

## 2018-04-13 DIAGNOSIS — D500 Iron deficiency anemia secondary to blood loss (chronic): Secondary | ICD-10-CM

## 2018-04-13 DIAGNOSIS — D509 Iron deficiency anemia, unspecified: Secondary | ICD-10-CM | POA: Diagnosis not present

## 2018-04-13 DIAGNOSIS — Z79899 Other long term (current) drug therapy: Secondary | ICD-10-CM | POA: Diagnosis not present

## 2018-04-13 DIAGNOSIS — I10 Essential (primary) hypertension: Secondary | ICD-10-CM | POA: Diagnosis not present

## 2018-04-13 LAB — CBC WITH DIFFERENTIAL/PLATELET
Abs Immature Granulocytes: 0.02 10*3/uL (ref 0.00–0.07)
BASOS ABS: 0 10*3/uL (ref 0.0–0.1)
Basophils Relative: 1 %
EOS ABS: 0.4 10*3/uL (ref 0.0–0.5)
EOS PCT: 5 %
HEMATOCRIT: 41.6 % (ref 36.0–46.0)
Hemoglobin: 12.5 g/dL (ref 12.0–15.0)
IMMATURE GRANULOCYTES: 0 %
LYMPHS ABS: 1.4 10*3/uL (ref 0.7–4.0)
Lymphocytes Relative: 19 %
MCH: 23.7 pg — ABNORMAL LOW (ref 26.0–34.0)
MCHC: 30 g/dL (ref 30.0–36.0)
MCV: 78.8 fL — ABNORMAL LOW (ref 80.0–100.0)
Monocytes Absolute: 0.7 10*3/uL (ref 0.1–1.0)
Monocytes Relative: 9 %
NEUTROS PCT: 66 %
NRBC: 0 % (ref 0.0–0.2)
Neutro Abs: 4.9 10*3/uL (ref 1.7–7.7)
Platelets: 291 10*3/uL (ref 150–400)
RBC: 5.28 MIL/uL — ABNORMAL HIGH (ref 3.87–5.11)
RDW: 16.3 % — AB (ref 11.5–15.5)
WBC: 7.4 10*3/uL (ref 4.0–10.5)

## 2018-04-13 LAB — COMPREHENSIVE METABOLIC PANEL
ALBUMIN: 4.2 g/dL (ref 3.5–5.0)
ALT: 13 U/L (ref 0–44)
ANION GAP: 8 (ref 5–15)
AST: 21 U/L (ref 15–41)
Alkaline Phosphatase: 63 U/L (ref 38–126)
BILIRUBIN TOTAL: 0.7 mg/dL (ref 0.3–1.2)
BUN: 14 mg/dL (ref 8–23)
CO2: 28 mmol/L (ref 22–32)
Calcium: 9.1 mg/dL (ref 8.9–10.3)
Chloride: 103 mmol/L (ref 98–111)
Creatinine, Ser: 0.83 mg/dL (ref 0.44–1.00)
GFR calc Af Amer: >60 mL/min (ref >60)
GFR calc non Af Amer: >60 mL/min (ref >60)
GLUCOSE: 91 mg/dL (ref 70–99)
POTASSIUM: 4 mmol/L (ref 3.5–5.1)
SODIUM: 139 mmol/L (ref 135–145)
TOTAL PROTEIN: 7.1 g/dL (ref 6.5–8.1)

## 2018-04-13 LAB — FERRITIN: Ferritin: 47 ng/mL (ref 11–307)

## 2018-04-13 LAB — LACTATE DEHYDROGENASE: LDH: 183 U/L (ref 98–192)

## 2018-04-20 ENCOUNTER — Inpatient Hospital Stay (HOSPITAL_BASED_OUTPATIENT_CLINIC_OR_DEPARTMENT_OTHER): Payer: PPO | Admitting: Internal Medicine

## 2018-04-20 ENCOUNTER — Other Ambulatory Visit: Payer: Self-pay

## 2018-04-20 VITALS — BP 128/68 | HR 78 | Temp 97.8°F | Resp 16 | Wt 160.9 lb

## 2018-04-20 DIAGNOSIS — I10 Essential (primary) hypertension: Secondary | ICD-10-CM | POA: Diagnosis not present

## 2018-04-20 DIAGNOSIS — D509 Iron deficiency anemia, unspecified: Secondary | ICD-10-CM

## 2018-04-20 DIAGNOSIS — K5900 Constipation, unspecified: Secondary | ICD-10-CM

## 2018-04-20 DIAGNOSIS — D500 Iron deficiency anemia secondary to blood loss (chronic): Secondary | ICD-10-CM

## 2018-04-20 NOTE — Progress Notes (Signed)
Diagnosis Iron deficiency anemia due to chronic blood loss - Plan: CBC with Differential/Platelet, Comprehensive metabolic panel, Lactate dehydrogenase, Ferritin  Staging Cancer Staging No matching staging information was found for the patient.  Assessment and Plan:  1.  Microcytic anemia.  Felt secondary to thalassemia minor.   Pt has no M-spike.   Labs done 04/13/2018 reviewed and showed WBC 7.4 HB 12.5 plts 291,000.  Ferritin is 47 which is WNL.  PT will go on MVI with iron.  She will RTC in 10/2018 for follow-up with labs.      2.  HTN.  BP is 128/68.   Follow-up with PCP.   3  Constipation.  Stool softeners recommended.  Follow-up with GI as recommended.  Pt will hold iron and use MVI with iron to determine if improvement in symptoms.    4  Health maintenance.  Mammogram screening and follow-up with GI as recommended.   Current Status:  Pt is seen today for follow-up.  She is here to go over labs.  She reports some constipation.    Problem List Patient Active Problem List   Diagnosis Date Noted  . Cyst, dermoid, arm, left [D36.7] 03/26/2018  . Diarrhea [R19.7] 08/10/2017  . Constipation [K59.00] 02/10/2017  . AVM (arteriovenous malformation) of small bowel, acquired [K55.20] 02/10/2017  . Iron deficiency anemia due to chronic blood loss [D50.0] 07/23/2016  . Gastritis and gastroduodenitis [K29.70, K41.90]   . Symptomatic anemia [D64.9] 02/29/2016  . Weight loss [R63.4] 04/01/2012  . Microcytic anemia [D50.9] 01/31/2010  . Cerebrovascular disease [I67.9] 01/31/2010  . Arteriosclerotic cardiovascular disease (ASCVD) [I25.10] 01/03/2009  . HYPERLIPIDEMIA [E78.5] 10/22/2007  . Essential hypertension [I10] 10/22/2007    Past Medical History Past Medical History:  Diagnosis Date  . Arteriosclerotic cardiovascular disease (ASCVD) 2001   Bare-metal stent placed in the right coronary artery in 12/01; residual 50% lesion of the first diagonal and mid LAD  . Cerebrovascular  disease   . Cyst, dermoid, arm, left 03/26/2018  . Diabetes mellitus    excellent control with a low-dose of a single oral agent  . GERD (gastroesophageal reflux disease)    with ulcers  . History of right coronary artery stent placement 2000  . Hyperlipidemia   . Hypertension   . Thalassemia minor   . Tobacco abuse, in remission    35 pack years; Quit in 1980    Past Surgical History Past Surgical History:  Procedure Laterality Date  . COLONOSCOPY  2006   Dr. Collene Mares: normal  . COLONOSCOPY  2000   Dr. Everlean Cherry: normal   . COLONOSCOPY N/A 03/03/2016   Dr. Oneida Alar: diverticulosis, non-bleeding hemorrhoids, redundant left colon.  Marland Kitchen DILATION AND CURETTAGE OF UTERUS    . ESOPHAGOGASTRODUODENOSCOPY  2000   Dr. Everlean Cherry: gastritis   . ESOPHAGOGASTRODUODENOSCOPY  2006   Dr. Collene Mares: small hiatal hernia, gastritis, negative H.pylori   . ESOPHAGOGASTRODUODENOSCOPY N/A 03/03/2016   Procedure: ESOPHAGOGASTRODUODENOSCOPY (EGD);  Surgeon: Danie Binder, MD;  [facility];  Service: Endoscopy;  Laterality: N/A;  . ESOPHAGOGASTRODUODENOSCOPY N/A 06/03/2016   Dr. Oneida Alar: nonaggressive gastritis due to aspirin use. Previous ulcers had healed.  Marland Kitchen EXCISION OF KELOID Left 03/26/2018   Procedure: EXCISION OF LEFT ARM MASS;  Surgeon: Fanny Skates, MD;  Location: [facility];  Service: General;  Laterality: Left;  . INGUINAL HERNIA REPAIR  1970s   Right  . VAGINAL HYSTERECTOMY  1980   Unilateral oophorectomy    Family History Family History  Problem Relation  Age of Onset  . Diabetes Sister   . Heart disease Mother        also hypertension and asthma  . Cancer Mother   . Colon cancer Neg Hx   . Colon polyps Neg Hx      Social History  reports that she quit smoking about 37 years ago. Her smoking use included cigarettes. She started smoking about 52 years ago. She has a 18.00 pack-year smoking history. She has never used smokeless tobacco. She reports that she does not drink  alcohol or use drugs.  Medications  Current Outpatient Medications:  .  amLODipine (NORVASC) 10 MG tablet, TAKE 1 TABLET BY MOUTH ONCE DAILY, Disp: 90 tablet, Rfl: 2 .  aspirin 81 MG tablet, Take 81 mg by mouth daily.  , Disp: , Rfl:  .  bimatoprost (LUMIGAN) 0.01 % SOLN, Place 1 drop into both eyes at bedtime., Disp: , Rfl:  .  carvedilol (COREG) 6.25 MG tablet, Take 6.25 mg by mouth 2 (two) times daily., Disp: , Rfl:  .  doxycycline (VIBRAMYCIN) 100 MG capsule, Take 100 mg by mouth 2 (two) times daily., Disp: , Rfl: 0 .  Hypromellose (ISOPTO TEARS) 0.5 % SOLN, Place 1 drop into both eyes 3 (three) times daily as needed (for dry eyes)., Disp: , Rfl:  .  irbesartan (AVAPRO) 150 MG tablet, Take 1 tablet (150 mg total) by mouth daily., Disp: 90 tablet, Rfl: 3 .  lansoprazole (PREVACID) 30 MG capsule, Take 1 capsule (30 mg total) by mouth daily before breakfast., Disp: 30 capsule, Rfl: 11 .  Latanoprostene Bunod (VYZULTA) 0.024 % SOLN, Place 1 drop into both eyes daily., Disp: , Rfl:  .  metFORMIN (GLUCOPHAGE) 500 MG tablet, Take 500 mg by mouth daily with breakfast., Disp: , Rfl:  .  POLY-IRON 150 FORTE 150-25-1 MG-MCG-MG CAPS, TAKE 1 CAPSULE BY MOUTH ONCE DAILY, Disp: 90 each, Rfl: 1 .  rosuvastatin (CRESTOR) 20 MG tablet, TAKE 1 TABLET BY MOUTH ONCE DAILY, Disp: 90 tablet, Rfl: 3  Allergies Protonix [pantoprazole sodium] and Shellfish allergy  Review of Systems Review of Systems - Oncology ROS negative other than constipation   Physical Exam  Vitals Wt Readings from Last 3 Encounters:  04/20/18 160 lb 14.4 oz (73 kg)  03/26/18 159 lb 13.3 oz (72.5 kg)  01/20/18 164 lb 3.2 oz (74.5 kg)   Temp Readings from Last 3 Encounters:  04/20/18 97.8 F (36.6 C) (Oral)  03/26/18 (!) 97.3 F (36.3 C)  01/20/18 (!) 97.2 F (36.2 C) (Oral)   BP Readings from Last 3 Encounters:  04/20/18 128/68  03/26/18 (!) 158/85  01/20/18 137/82   Pulse Readings from Last 3 Encounters:  04/20/18 78   03/26/18 64  01/20/18 78   Constitutional: Well-developed, well-nourished, and in no distress.   HENT: Head: Normocephalic and atraumatic.  Mouth/Throat: No oropharyngeal exudate. Mucosa moist. Eyes: Pupils are equal, round, and reactive to light. Conjunctivae are normal. No scleral icterus.  Neck: Normal range of motion. Neck supple. No JVD present.  Cardiovascular: Normal rate, regular rhythm and normal heart sounds.  Exam reveals no gallop and no friction rub.   No murmur heard. Pulmonary/Chest: Effort normal and breath sounds normal. No respiratory distress. No wheezes.No rales.  Abdominal: Soft. Bowel sounds are normal. No distension. There is no tenderness. There is no guarding.  Musculoskeletal: No edema or tenderness.  Lymphadenopathy: No cervical, axillary or supraclavicular adenopathy.  Neurological: Alert and oriented to person, place, and time. No cranial  nerve deficit.  Skin: Skin is warm and dry. No rash noted. No erythema. No pallor.  Psychiatric: Affect and judgment normal.   Labs No visits with results within 3 Day(s) from this visit.  Latest known visit with results is:  Appointment on 04/13/2018  Component Date Value Ref Range Status  . WBC 04/13/2018 7.4  4.0 - 10.5 K/uL Final  . RBC 04/13/2018 5.28* 3.87 - 5.11 MIL/uL Final  . Hemoglobin 04/13/2018 12.5  12.0 - 15.0 g/dL Final  . HCT 04/13/2018 41.6  36.0 - 46.0 % Final  . MCV 04/13/2018 78.8* 80.0 - 100.0 fL Final  . MCH 04/13/2018 23.7* 26.0 - 34.0 pg Final  . MCHC 04/13/2018 30.0  30.0 - 36.0 g/dL Final  . RDW 04/13/2018 16.3* 11.5 - 15.5 % Final  . Platelets 04/13/2018 291  150 - 400 K/uL Final  . nRBC 04/13/2018 0.0  0.0 - 0.2 % Final  . Neutrophils Relative % 04/13/2018 66  % Final  . Neutro Abs 04/13/2018 4.9  1.7 - 7.7 K/uL Final  . Lymphocytes Relative 04/13/2018 19  % Final  . Lymphs Abs 04/13/2018 1.4  0.7 - 4.0 K/uL Final  . Monocytes Relative 04/13/2018 9  % Final  . Monocytes Absolute  04/13/2018 0.7  0.1 - 1.0 K/uL Final  . Eosinophils Relative 04/13/2018 5  % Final  . Eosinophils Absolute 04/13/2018 0.4  0.0 - 0.5 K/uL Final  . Basophils Relative 04/13/2018 1  % Final  . Basophils Absolute 04/13/2018 0.0  0.0 - 0.1 K/uL Final  . Immature Granulocytes 04/13/2018 0  % Final  . Abs Immature Granulocytes 04/13/2018 0.02  0.00 - 0.07 K/uL Final   Performed at [facility], [address], [place], [place] [address]  . Sodium 04/13/2018 139  135 - 145 mmol/L Final  . Potassium 04/13/2018 4.0  3.5 - 5.1 mmol/L Final  . Chloride 04/13/2018 103  98 - 111 mmol/L Final  . CO2 04/13/2018 28  22 - 32 mmol/L Final  . Glucose, Bld 04/13/2018 91  70 - 99 mg/dL Final  . BUN 04/13/2018 14  8 - 23 mg/dL Final  . Creatinine, Ser 04/13/2018 0.83  0.44 - 1.00 mg/dL Final  . Calcium 04/13/2018 9.1  8.9 - 10.3 mg/dL Final  . Total Protein 04/13/2018 7.1  6.5 - 8.1 g/dL Final  . Albumin 04/13/2018 4.2  3.5 - 5.0 g/dL Final  . AST 04/13/2018 21  15 - 41 U/L Final  . ALT 04/13/2018 13  0 - 44 U/L Final  . Alkaline Phosphatase 04/13/2018 63  38 - 126 U/L Final  . Total Bilirubin 04/13/2018 0.7  0.3 - 1.2 mg/dL Final  . GFR calc non Af Amer 04/13/2018 >60  >60 mL/min Final  . GFR calc Af Amer 04/13/2018 >60  >60 mL/min Final   Comment: (NOTE) The eGFR has been calculated using the CKD EPI equation. This calculation has not been validated in all clinical situations. eGFR's persistently <60 mL/min signify possible Chronic Kidney Disease.   Georgiann Hahn gap 04/13/2018 8  5 - 15 Final   Performed at [facility], [address] [place], [place] [address]  . LDH 04/13/2018 183  98 - 192 U/L Final   Performed at [facility], [address] [place], [place] [address]  . Ferritin 04/13/2018 47  11 - 307 ng/mL Final   Performed at [facility], [address] [place], [place] [address]     Pathology Orders Placed This Encounter  Procedures  . CBC with Differential/Platelet    Standing Status:    Future    Standing Expiration Date:   04/20/2020  . Comprehensive metabolic panel    Standing Status:   Future    Standing Expiration Date:   04/20/2020  . Lactate dehydrogenase    Standing Status:   Future    Standing Expiration Date:   04/20/2020  . Ferritin    Standing Status:   Future    Standing Expiration Date:   04/20/2020       Zoila Shutter MD

## 2018-04-20 NOTE — Patient Instructions (Signed)
[facility]  You saw Dr. Vetta Higgs today _______________________________________________________________  Thank you for choosing [facility] to provide your oncology and hematology care.  To afford each patient quality time with our providers, please arrive at least 15 minutes before your scheduled appointment.  You need to re-schedule your appointment if you arrive 10 or more minutes late.  We strive to give you quality time with our providers, and arriving late affects you and other patients whose appointments are after yours.  Also, if you no show three or more times for appointments you may be dismissed from the clinic.  Again, thank you for choosing  [facility]. Our hope is that these requests will allow you access to exceptional care and in a timely manner. _______________________________________________________________  If you have questions after your visit, please contact our office at [phone] between the hours of 8:30 a.m. and 5:00 p.m. Voicemails left after 4:30 p.m. will not be returned until the following business day. _______________________________________________________________  For prescription refill requests, have your pharmacy contact our office. _______________________________________________________________  Recommendations made by the consultant and any test results will be sent to your referring physician. _______________________________________________________________ 

## 2018-05-03 ENCOUNTER — Other Ambulatory Visit (HOSPITAL_COMMUNITY): Payer: Self-pay | Admitting: Family Medicine

## 2018-05-03 ENCOUNTER — Ambulatory Visit (HOSPITAL_COMMUNITY)
Admission: RE | Admit: 2018-05-03 | Discharge: 2018-05-03 | Disposition: A | Discharge status: Home / Self Care | Payer: PPO | Source: Ambulatory Visit | Attending: Family Medicine | Admitting: Family Medicine

## 2018-05-03 DIAGNOSIS — Z1231 Encounter for screening mammogram for malignant neoplasm of breast: Secondary | ICD-10-CM

## 2018-05-03 DIAGNOSIS — R928 Other abnormal and inconclusive findings on diagnostic imaging of breast: Secondary | ICD-10-CM

## 2018-05-10 DIAGNOSIS — F981 Encopresis not due to a substance or known physiological condition: Secondary | ICD-10-CM | POA: Diagnosis not present

## 2018-05-10 DIAGNOSIS — Z0000 Encounter for general adult medical examination without abnormal findings: Secondary | ICD-10-CM | POA: Diagnosis not present

## 2018-05-10 DIAGNOSIS — R634 Abnormal weight loss: Secondary | ICD-10-CM | POA: Diagnosis not present

## 2018-05-10 DIAGNOSIS — I10 Essential (primary) hypertension: Secondary | ICD-10-CM | POA: Diagnosis not present

## 2018-05-11 ENCOUNTER — Ambulatory Visit (HOSPITAL_COMMUNITY)
Admission: RE | Admit: 2018-05-11 | Discharge: 2018-05-11 | Disposition: A | Discharge status: Home / Self Care | Payer: PPO | Source: Ambulatory Visit | Attending: Family Medicine | Admitting: Family Medicine

## 2018-05-11 ENCOUNTER — Ambulatory Visit (HOSPITAL_COMMUNITY): Admission: RE | Admit: 2018-05-11 | Payer: PPO | Source: Ambulatory Visit

## 2018-05-11 DIAGNOSIS — R928 Other abnormal and inconclusive findings on diagnostic imaging of breast: Secondary | ICD-10-CM | POA: Insufficient documentation

## 2018-05-12 ENCOUNTER — Other Ambulatory Visit (HOSPITAL_COMMUNITY): Payer: Self-pay | Admitting: Hematology

## 2018-05-12 DIAGNOSIS — D509 Iron deficiency anemia, unspecified: Secondary | ICD-10-CM

## 2018-05-13 ENCOUNTER — Ambulatory Visit (INDEPENDENT_AMBULATORY_CARE_PROVIDER_SITE_OTHER): Payer: PPO | Admitting: Gastroenterology

## 2018-05-13 ENCOUNTER — Encounter: Payer: Self-pay | Admitting: Gastroenterology

## 2018-05-13 VITALS — BP 163/92 | HR 85 | Temp 97.0°F | Ht 62.5 in | Wt 161.6 lb

## 2018-05-13 DIAGNOSIS — D500 Iron deficiency anemia secondary to blood loss (chronic): Secondary | ICD-10-CM

## 2018-05-13 DIAGNOSIS — K5900 Constipation, unspecified: Secondary | ICD-10-CM

## 2018-05-13 MED ORDER — LANSOPRAZOLE 30 MG PO CPDR: 30.0000 mg | DELAYED_RELEASE_CAPSULE | Freq: Every day | ORAL | 3 refills | Status: DC

## 2018-05-13 NOTE — Assessment & Plan Note (Signed)
History of ulcers now healed and AVMs. Remains on aspirin but stopped PPI. Resume Prevacid daily. Prescription provided, as we may need to do another agent that is available per prescription and less costly than over the counter. Continue follow-up closely with Hematology.

## 2018-05-13 NOTE — Progress Notes (Signed)
CC'D TO PCP °

## 2018-05-13 NOTE — Assessment & Plan Note (Signed)
77 year old female with history of diarrhea but now dealing more with constipation for the past several months. PCP started on Linzess 145 mcg yesterday, and thus far has had a good response. Over past month noted fecal seepage and one episode of incontinence; this is likely related to overflow.   Continue Linzess, call if dosage needs to be adjusted Continue probiotic Return in 3 months

## 2018-05-13 NOTE — Progress Notes (Signed)
Referring Provider: Lucianne Lei, MD Primary Care Physician:  Lucianne Lei, MD  Primary GI: Dr. Oneida Alar   Chief Complaint  Patient presents with  . Constipation    will have BM then can go 2-5 days w/o BM. BM's are bristol 7 when she has on. Reports this started last year when she started iron. PCP started her on linzess and restora on Monday    HPI:   Nancy Liu is a 77 y.o. female presenting today with a history of IDA and diarrhea. EGD and colonoscopy in October 2017 revealed many non-bleeding gastric ulcers, gastroduodenitis (no H. 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 Diverticulosis and non-bleeding hemorrhoids. Repeat EGD in January 2018 showed erosive gastritis but ulcers had healed.  Last seen in Aug 2019 by Dr. Oneida Alar with constipation. Will have 2-5 days without a BM, then have a soft BM. In the last month, will have gas and bloating, then will have small stool in pants. Happened several times. Episode of fecal incontinence as well. No rectal bleeding, itching, burning.   Started Linzess 145 mcg and restora yesterday by PCP. Not on a PPI, as she stopped Prevacid. Occasionally reflux. Good BM yesterday.   Past Medical History:  Diagnosis Date  . Arteriosclerotic cardiovascular disease (ASCVD) 2001   Bare-metal stent placed in the right coronary artery in 12/01; residual 50% lesion of the first diagonal and mid LAD  . Cerebrovascular disease   . Cyst, dermoid, arm, left 03/26/2018  . Diabetes mellitus    excellent control with a low-dose of a single oral agent  . GERD (gastroesophageal reflux disease)    with ulcers  . History of right coronary artery stent placement 2000  . Hyperlipidemia   . Hypertension   . Thalassemia minor   . Tobacco abuse, in remission    35 pack years; Quit in 1980    Past Surgical History:  Procedure Laterality Date  . COLONOSCOPY  2006   Dr. Collene Mares: normal  . COLONOSCOPY  2000   Dr. Everlean Cherry: normal   . COLONOSCOPY  N/A 03/03/2016   Dr. Oneida Alar: diverticulosis, non-bleeding hemorrhoids, redundant left colon.  Marland Kitchen DILATION AND CURETTAGE OF UTERUS    . ESOPHAGOGASTRODUODENOSCOPY  2000   Dr. Everlean Cherry: gastritis   . ESOPHAGOGASTRODUODENOSCOPY  2006   Dr. Collene Mares: small hiatal hernia, gastritis, negative H.pylori   . ESOPHAGOGASTRODUODENOSCOPY N/A 03/03/2016   Procedure: ESOPHAGOGASTRODUODENOSCOPY (EGD);  Surgeon: Danie Binder, MD;  [facility];  Service: Endoscopy;  Laterality: N/A;  . ESOPHAGOGASTRODUODENOSCOPY N/A 06/03/2016   Dr. Oneida Alar: nonaggressive gastritis due to aspirin use. Previous ulcers had healed.  Marland Kitchen EXCISION OF KELOID Left 03/26/2018   Procedure: EXCISION OF LEFT ARM MASS;  Surgeon: Fanny Skates, MD;  Location: [facility];  Service: General;  Laterality: Left;  . INGUINAL HERNIA REPAIR  1970s   Right  . VAGINAL HYSTERECTOMY  1980   Unilateral oophorectomy    Current Outpatient Medications  Medication Sig Dispense Refill  . amLODipine (NORVASC) 10 MG tablet TAKE 1 TABLET BY MOUTH ONCE DAILY 90 tablet 2  . aspirin 81 MG tablet Take 81 mg by mouth daily.      . carvedilol (COREG) 6.25 MG tablet Take 6.25 mg by mouth 2 (two) times daily.    . Hypromellose (ISOPTO TEARS) 0.5 % SOLN Place 1 drop into both eyes 3 (three) times daily as needed (for dry eyes).    . irbesartan (AVAPRO) 150 MG tablet Take 1 tablet (  150 mg total) by mouth daily. 90 tablet 3  . Latanoprostene Bunod (VYZULTA) 0.024 % SOLN Place 1 drop into both eyes daily.    Marland Kitchen linaclotide (LINZESS) 145 MCG CAPS capsule Take 145 mcg by mouth daily before breakfast.    . metFORMIN (GLUCOPHAGE) 500 MG tablet Take 500 mg by mouth daily with breakfast.    . Multiple Vitamin (MULTIVITAMIN) tablet Take 1 tablet by mouth daily.    Marland Kitchen POLY-IRON 150 FORTE 150-25-1 MG-MCG-MG CAPS TAKE 1 CAPSULE BY MOUTH ONCE DAILY 90 each 1  . Probiotic Product (RESTORA PO) Take 1 tablet by mouth daily.    . rosuvastatin (CRESTOR) 20 MG  tablet TAKE 1 TABLET BY MOUTH ONCE DAILY 90 tablet 3  . lansoprazole (PREVACID) 30 MG capsule Take 1 capsule (30 mg total) by mouth daily before breakfast. 90 capsule 3   No current facility-administered medications for this visit.     Allergies as of 05/13/2018 - Review Complete 05/13/2018  Allergen Reaction Noted  . Protonix [pantoprazole sodium]  03/27/2016  . Shellfish allergy  02/18/2011    Family History  Problem Relation Age of Onset  . Diabetes Sister   . Heart disease Mother        also hypertension and asthma  . Cancer Mother   . Colon cancer Neg Hx   . Colon polyps Neg Hx     Social History   Socioeconomic History  . Marital status: Divorced    Spouse name: Not on file  . Number of children: 3  . Years of education: Not on file  . Highest education level: Not on file  Occupational History  . 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 Financial resource strain: Not on file  . Food insecurity:    Worry: Not on file    Inability: Not on file  . Transportation needs:    Medical: Not on file    Non-medical: Not on file  Tobacco Use  . Smoking status: Former Smoker    Packs/day: 1.00    Years: 18.00    Pack years: 18.00    Types: Cigarettes    Start date: 06/02/1965    Last attempt to quit: 06/09/1980    Years since quitting: 37.9  . Smokeless tobacco: Never Used  Substance and Sexual Activity  . Alcohol use: No    Alcohol/week: 0.0 standard drinks  . Drug use: No  . Sexual activity: Never  Lifestyle  . Physical activity:    Days per week: Not on file    Minutes per session: Not on file  . Stress: Not on file  Relationships  . Social connections:    Talks on phone: Not on file    Gets together: Not on file    Attends religious service: Not on file    Active member of club or organization: Not on file    Attends meetings of clubs or organizations: Not on file    Relationship status: Not on file  Other Topics Concern  . Not on  file  Social History Narrative   Lives with daughter     Review of Systems: Gen: Denies fever, chills, anorexia. Denies fatigue, weakness, weight loss.  CV: Denies chest pain, palpitations, syncope, peripheral edema, and claudication. Resp: Denies dyspnea at rest, cough, wheezing, coughing up blood, and pleurisy. GI: see HPI  Derm: Denies rash, itching, dry skin Psych: Denies depression, anxiety, memory loss, confusion. No homicidal or suicidal ideation.  Heme:  Denies bruising, bleeding, and enlarged lymph nodes.  Physical Exam: BP (!) 163/92   Pulse 85   Temp (!) 97 F (36.1 C) (Oral)   Ht 5' 2.5" (1.588 m)   Wt 161 lb 9.6 oz (73.3 kg)   BMI 29.09 kg/m  General:   Alert and oriented. No distress noted. Pleasant and cooperative.  Head:  Normocephalic and atraumatic. Eyes:  Conjuctiva clear without scleral icterus. Mouth:  Oral mucosa pink and moist.  Abdomen:  +BS, soft, non-tender and non-distended. No rebound or guarding. No HSM or masses noted. Msk:  Symmetrical without gross deformities. Normal posture. Extremities:  Without edema. Neurologic:  Alert and  oriented x4 Psych:  Alert and cooperative. Normal mood and affect.

## 2018-05-13 NOTE — Patient Instructions (Signed)
Continue Linzess 145 micrograms each morning on an empty stomach, 30 minutes before breakfast. Give it a good week before deciding if it is the best dosage for you. As we talked about, you may have some loose stool for a few days.   I have printed the prescription for Prevacid. I would like for you to take this every day as well, 30 minutes before breakfast. There are other options we could try that may be more cost-effective through insurance. Call and let us know if pricing is not the best with Prevacid.  We will see you in 3 months!  Have a wonderful Christmas and New Year!  It was a pleasure to see you today. I strive to create trusting relationships with patients to provide genuine, compassionate, and quality care. I value your feedback. If you receive a survey regarding your visit,  I greatly appreciate you taking time to fill this out.   Annitta Needs, PhD, ANP-BC [facility]

## 2018-05-28 DIAGNOSIS — J069 Acute upper respiratory infection, unspecified: Secondary | ICD-10-CM | POA: Diagnosis not present

## 2018-06-24 DIAGNOSIS — H2513 Age-related nuclear cataract, bilateral: Secondary | ICD-10-CM | POA: Diagnosis not present

## 2018-06-24 DIAGNOSIS — H401131 Primary open-angle glaucoma, bilateral, mild stage: Secondary | ICD-10-CM | POA: Diagnosis not present

## 2018-08-09 ENCOUNTER — Encounter: Payer: Self-pay | Admitting: Cardiology

## 2018-08-09 ENCOUNTER — Ambulatory Visit: Payer: PPO | Admitting: Cardiology

## 2018-08-09 VITALS — BP 120/62 | HR 78 | Ht 62.5 in | Wt 161.8 lb

## 2018-08-09 DIAGNOSIS — I10 Essential (primary) hypertension: Secondary | ICD-10-CM

## 2018-08-09 DIAGNOSIS — E782 Mixed hyperlipidemia: Secondary | ICD-10-CM

## 2018-08-09 DIAGNOSIS — I2510 Atherosclerotic heart disease of native coronary artery without angina pectoris: Secondary | ICD-10-CM

## 2018-08-09 NOTE — Patient Instructions (Signed)
Medication Instructions: Your physician recommends that you continue on your current medications as directed. Please refer to the Current Medication list given to you today.   Labwork: None today  Procedures/Testing: None today  Follow-Up: 1 year with Dr.Branch  Any Additional Special Instructions Will Be Listed Below (If Applicable).     If you need a refill on your cardiac medications before your next appointment, please call your pharmacy.

## 2018-08-09 NOTE — Progress Notes (Signed)
Clinical Summary Ms. Custer is a 78 y.o.female seen today for follow up of the following medical problems.   1. CAD  - prior BMS to RCA in 2001 at [facility]  - 02/2011 MPI no ischemia  - echo 04/2013 showed LVEF 60-65% with grade I diastolic dysfunction.    - denies any recent chest pain.No SOB/DOE - compliant with meds.   2. HTN  - compliant with meds  3. Hyperlpidiemia  - 05/2016 TC 134 TG 100 HDL 40 LDL 75 - labs followed by pcp, compliant with statin   4. SOB/Abnormal PFTs - former smoker x 30-35 years. - PFTs Jan 2017 with severely redcued DLCO, referred to Dr Luan Pulling.  - followed by Dr Luan Pulling  5. Anemia -followed by Dr Talbert Cage hematology     SH: has 3 children. 2 grandchilren, one is a Marine scientist working at childrens hospital in [place].Doristine Devoid grandbaby is on the way in November.   Past Medical History:  Diagnosis Date  . Arteriosclerotic cardiovascular disease (ASCVD) 2001   Bare-metal stent placed in the right coronary artery in 12/01; residual 50% lesion of the first diagonal and mid LAD  . Cerebrovascular disease   . Cyst, dermoid, arm, left 03/26/2018  . Diabetes mellitus    excellent control with a low-dose of a single oral agent  . GERD (gastroesophageal reflux disease)    with ulcers  . History of right coronary artery stent placement 2000  . Hyperlipidemia   . Hypertension   . Thalassemia minor   . Tobacco abuse, in remission    35 pack years; Quit in 1980     Allergies  Allergen Reactions  . Protonix [Pantoprazole Sodium]     DRY LIPS  . Shellfish Allergy     Unknown reaction     Current Outpatient Medications  Medication Sig Dispense Refill  . amLODipine (NORVASC) 10 MG tablet TAKE 1 TABLET BY MOUTH ONCE DAILY 90 tablet 2  . aspirin 81 MG tablet Take 81 mg by mouth daily.      . carvedilol (COREG) 6.25 MG tablet Take 6.25 mg by mouth 2 (two) times daily.    . Hypromellose (ISOPTO TEARS) 0.5 % SOLN Place 1  drop into both eyes 3 (three) times daily as needed (for dry eyes).    . irbesartan (AVAPRO) 150 MG tablet Take 1 tablet (150 mg total) by mouth daily. 90 tablet 3  . lansoprazole (PREVACID) 30 MG capsule Take 1 capsule (30 mg total) by mouth daily before breakfast. 90 capsule 3  . Latanoprostene Bunod (VYZULTA) 0.024 % SOLN Place 1 drop into both eyes daily.    Marland Kitchen linaclotide (LINZESS) 145 MCG CAPS capsule Take 145 mcg by mouth daily before breakfast.    . metFORMIN (GLUCOPHAGE) 500 MG tablet Take 500 mg by mouth daily with breakfast.    . Multiple Vitamin (MULTIVITAMIN) tablet Take 1 tablet by mouth daily.    Marland Kitchen POLY-IRON 150 FORTE 150-25-1 MG-MCG-MG CAPS TAKE 1 CAPSULE BY MOUTH ONCE DAILY 90 each 1  . Probiotic Product (RESTORA PO) Take 1 tablet by mouth daily.    . rosuvastatin (CRESTOR) 20 MG tablet TAKE 1 TABLET BY MOUTH ONCE DAILY 90 tablet 3   No current facility-administered medications for this visit.      Past Surgical History:  Procedure Laterality Date  . COLONOSCOPY  2006   Dr. Collene Mares: normal  . COLONOSCOPY  2000   Dr. Everlean Cherry: normal   . COLONOSCOPY N/A 03/03/2016  Dr. Oneida Alar: diverticulosis, non-bleeding hemorrhoids, redundant left colon.  Marland Kitchen DILATION AND CURETTAGE OF UTERUS    . ESOPHAGOGASTRODUODENOSCOPY  2000   Dr. Everlean Cherry: gastritis   . ESOPHAGOGASTRODUODENOSCOPY  2006   Dr. Collene Mares: small hiatal hernia, gastritis, negative H.pylori   . ESOPHAGOGASTRODUODENOSCOPY N/A 03/03/2016   Procedure: ESOPHAGOGASTRODUODENOSCOPY (EGD);  Surgeon: Danie Binder, MD;  [facility];  Service: Endoscopy;  Laterality: N/A;  . ESOPHAGOGASTRODUODENOSCOPY N/A 06/03/2016   Dr. Oneida Alar: nonaggressive gastritis due to aspirin use. Previous ulcers had healed.  Marland Kitchen EXCISION OF KELOID Left 03/26/2018   Procedure: EXCISION OF LEFT ARM MASS;  Surgeon: Fanny Skates, MD;  Location: [place];  Service: General;  Laterality: Left;  . INGUINAL HERNIA REPAIR  1970s   Right  .  VAGINAL HYSTERECTOMY  1980   Unilateral oophorectomy     Allergies  Allergen Reactions  . Protonix [Pantoprazole Sodium]     DRY LIPS  . Shellfish Allergy     Unknown reaction      Family History  Problem Relation Age of Onset  . Diabetes Sister   . Heart disease Mother        also hypertension and asthma  . Cancer Mother   . Colon cancer Neg Hx   . Colon polyps Neg Hx      Social History Ms. Lupien reports that she quit smoking about 38 years ago. Her smoking use included cigarettes. She started smoking about 53 years ago. She has a 18.00 pack-year smoking history. She has never used smokeless tobacco. Ms. Parekh reports no history of alcohol use.   Review of Systems CONSTITUTIONAL: No weight loss, fever, chills, weakness or fatigue.  HEENT: Eyes: No visual loss, blurred vision, double vision or yellow sclerae.No hearing loss, sneezing, congestion, runny nose or sore throat.  SKIN: No rash or itching.  CARDIOVASCULAR: per hpi RESPIRATORY: No shortness of breath, cough or sputum.  GASTROINTESTINAL: No anorexia, nausea, vomiting or diarrhea. No abdominal pain or blood.  GENITOURINARY: No burning on urination, no polyuria NEUROLOGICAL: No headache, dizziness, syncope, paralysis, ataxia, numbness or tingling in the extremities. No change in bowel or bladder control.  MUSCULOSKELETAL: No muscle, back pain, joint pain or stiffness.  LYMPHATICS: No enlarged nodes. No history of splenectomy.  PSYCHIATRIC: No history of depression or anxiety.  ENDOCRINOLOGIC: No reports of sweating, cold or heat intolerance. No polyuria or polydipsia.  Marland Kitchen   Physical Examination Vitals:   08/09/18 0815  BP: 120/62  Pulse: 78  SpO2: 99%   Vitals:   08/09/18 0815  Weight: 161 lb 12.8 oz (73.4 kg)  Height: 5' 2.5" (1.588 m)    Gen: resting comfortably, no acute distress HEENT: no scleral icterus, pupils equal round and reactive, no palptable cervical adenopathy,  CV: RRR, no m/r/g, no  jvd Resp: Clear to auscultation bilaterally GI: abdomen is soft, non-tender, non-distended, normal bowel sounds, no hepatosplenomegaly MSK: extremities are warm, no edema.  Skin: warm, no rash Neuro:  no focal deficits Psych: appropriate affect   Diagnostic Studies 02/2011 MPI Low risk exercise/Lexiscan Myoview as outlined. Equivocal ST- segment changes were noted in the setting of lead motion artifact. No chest pain reported. Perfusion imaging is most consistent with breast attenuation without clear evidence of scar or ischemia. LVEF 77%.  04/06/13 Clinic EKG: sinus rhythm, nomral axis, normal intervals, non-specific ST/T changes  04/2013 Echo LVEF 88-41%, grade I diastolic dysfunction,   Jan 2017 PFTs: normal spirometry, minimal restrictive changes, severely decreased DLCO  Assessment and Plan   1. CAD -doing well, nearly 20 years since her intervention. No symptoms, continue current meds  2. HTN  - at goal, continue current meds  3. Hyperlipidemia  - request labs from pcp, continue statin     F/u 1 year  Arnoldo Lenis, M.D.

## 2018-08-10 NOTE — Progress Notes (Signed)
Referring Provider: Lucianne Lei, MD Primary Care Physician:  Lucianne Lei, MD  Primary GI: Dr. Oneida Alar   Chief Complaint  Patient presents with  . Constipation    unable to take Linzess daily d/t causes diarrhea    HPI:   Nancy Liu is a 78 y.o. female presenting today with a history of IDA and diarrhea. EGD and colonoscopy in October 2017 revealed many non-bleeding gastric ulcers, gastroduodenitis (no H. 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 Diverticulosis and non-bleeding hemorrhoids. Repeat EGD in January 2018 showederosivegastritis butulcers had healed.  Dealing with constipation at last visit in 05/2018. Linzess 145 mcg. Presents today only taking Linzess every 3 days due to diarrhea with taking. Otherwise, she has no BM on days she does not take. Has not tried Amitiza. Taking Prevacid with control of GERD. No dysphagia. No abdominal pain. Continues to follow with Hematology due to [place]. Denies NSAIDs.   Past Medical History:  Diagnosis Date  . Arteriosclerotic cardiovascular disease (ASCVD) 2001   Bare-metal stent placed in the right coronary artery in 12/01; residual 50% lesion of the first diagonal and mid LAD  . Cerebrovascular disease   . Cyst, dermoid, arm, left 03/26/2018  . Diabetes mellitus    excellent control with a low-dose of a single oral agent  . GERD (gastroesophageal reflux disease)    with ulcers  . History of right coronary artery stent placement 2000  . Hyperlipidemia   . Hypertension   . Thalassemia minor   . Tobacco abuse, in remission    35 pack years; Quit in 1980    Past Surgical History:  Procedure Laterality Date  . COLONOSCOPY  2006   Dr. Collene Mares: normal  . COLONOSCOPY  2000   Dr. Everlean Cherry: normal   . COLONOSCOPY N/A 03/03/2016   Dr. Oneida Alar: diverticulosis, non-bleeding hemorrhoids, redundant left colon.  Marland Kitchen DILATION AND CURETTAGE OF UTERUS    . ESOPHAGOGASTRODUODENOSCOPY  2000   Dr. Everlean Cherry: gastritis   .  ESOPHAGOGASTRODUODENOSCOPY  2006   Dr. Collene Mares: small hiatal hernia, gastritis, negative H.pylori   . ESOPHAGOGASTRODUODENOSCOPY N/A 03/03/2016   Procedure: ESOPHAGOGASTRODUODENOSCOPY (EGD);  Surgeon: Danie Binder, MD;  [facility];  Service: Endoscopy;  Laterality: N/A;  . ESOPHAGOGASTRODUODENOSCOPY N/A 06/03/2016   Dr. Oneida Alar: nonaggressive gastritis due to aspirin use. Previous ulcers had healed.  Marland Kitchen EXCISION OF KELOID Left 03/26/2018   Procedure: EXCISION OF LEFT ARM MASS;  Surgeon: Fanny Skates, MD;  Location: [employer] [facility];  Service: General;  Laterality: Left;  . INGUINAL HERNIA REPAIR  1970s   Right  . VAGINAL HYSTERECTOMY  1980   Unilateral oophorectomy    Current Outpatient Medications  Medication Sig Dispense Refill  . amLODipine (NORVASC) 10 MG tablet TAKE 1 TABLET BY MOUTH ONCE DAILY 90 tablet 2  . aspirin 81 MG tablet Take 81 mg by mouth daily.      . carvedilol (COREG) 6.25 MG tablet Take 6.25 mg by mouth 2 (two) times daily.    . Hypromellose (ISOPTO TEARS) 0.5 % SOLN Place 1 drop into both eyes 3 (three) times daily as needed (for dry eyes).    . irbesartan (AVAPRO) 150 MG tablet Take 1 tablet (150 mg total) by mouth daily. 90 tablet 3  . lansoprazole (PREVACID) 30 MG capsule Take 1 capsule (30 mg total) by mouth daily before breakfast. 90 capsule 3  . Latanoprostene Bunod (VYZULTA) 0.024 % SOLN Place 1 drop into both eyes daily.    Marland Kitchen  linaclotide (LINZESS) 145 MCG CAPS capsule Take 145 mcg by mouth. Doesn't take everyday    . metFORMIN (GLUCOPHAGE) 500 MG tablet Take 500 mg by mouth daily with breakfast.    . Multiple Vitamin (MULTIVITAMIN) tablet Take 1 tablet by mouth daily.    Marland Kitchen POLY-IRON 150 FORTE 150-25-1 MG-MCG-MG CAPS TAKE 1 CAPSULE BY MOUTH ONCE DAILY 90 each 1  . Probiotic Product (RESTORA PO) Take 1 tablet by mouth daily.    . rosuvastatin (CRESTOR) 20 MG tablet TAKE 1 TABLET BY MOUTH ONCE DAILY 90 tablet 3   No current  facility-administered medications for this visit.     Allergies as of 08/12/2018 - Review Complete 08/12/2018  Allergen Reaction Noted  . Protonix [pantoprazole sodium]  03/27/2016  . Shellfish allergy  02/18/2011    Family History  Problem Relation Age of Onset  . Diabetes Sister   . Heart disease Mother        also hypertension and asthma  . Cancer Mother   . Colon cancer Neg Hx   . Colon polyps Neg Hx     Social History   Socioeconomic History  . Marital status: Divorced    Spouse name: Not on file  . Number of children: 3  . Years of education: Not on file  . Highest education level: Not on file  Occupational History  . 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 Financial resource strain: Not on file  . Food insecurity:    Worry: Not on file    Inability: Not on file  . Transportation needs:    Medical: Not on file    Non-medical: Not on file  Tobacco Use  . Smoking status: Former Smoker    Packs/day: 1.00    Years: 18.00    Pack years: 18.00    Types: Cigarettes    Start date: 06/02/1965    Last attempt to quit: 06/09/1980    Years since quitting: 38.2  . Smokeless tobacco: Never Used  Substance and Sexual Activity  . Alcohol use: No    Alcohol/week: 0.0 standard drinks  . Drug use: No  . Sexual activity: Never  Lifestyle  . Physical activity:    Days per week: Not on file    Minutes per session: Not on file  . Stress: Not on file  Relationships  . Social connections:    Talks on phone: Not on file    Gets together: Not on file    Attends religious service: Not on file    Active member of club or organization: Not on file    Attends meetings of clubs or organizations: Not on file    Relationship status: Not on file  Other Topics Concern  . Not on file  Social History Narrative   Lives with daughter     Review of Systems: Gen: Denies fever, chills, anorexia. Denies fatigue, weakness, weight loss.  CV: Denies chest pain,  palpitations, syncope, peripheral edema, and claudication. Resp: Denies dyspnea at rest, cough, wheezing, coughing up blood, and pleurisy. GI: see HPI Derm: Denies rash, itching, dry skin Psych: Denies depression, anxiety, memory loss, confusion. No homicidal or suicidal ideation.  Heme: Denies bruising, bleeding, and enlarged lymph nodes.  Physical Exam: BP 111/74   Pulse 77   Temp (!) 97.3 F (36.3 C) (Oral)   Ht 5' 2.5" (1.588 m)   Wt 163 lb 3.2 oz (74 kg)   BMI 29.37 kg/m  General:  Alert and oriented. No distress noted. Pleasant and cooperative.  Head:  Normocephalic and atraumatic. Eyes:  Conjuctiva clear without scleral icterus. Mouth:  Oral mucosa pink and moist.  Abdomen:  +BS, soft, non-tender and non-distended. No rebound or guarding. No HSM or masses noted. Msk:  Symmetrical without gross deformities. Normal posture. Extremities:  Without edema. Neurologic:  Alert and  oriented x4 Psych:  Alert and cooperative. Normal mood and affect.

## 2018-08-12 ENCOUNTER — Ambulatory Visit (INDEPENDENT_AMBULATORY_CARE_PROVIDER_SITE_OTHER): Payer: PPO | Admitting: Gastroenterology

## 2018-08-12 ENCOUNTER — Encounter: Payer: Self-pay | Admitting: Gastroenterology

## 2018-08-12 ENCOUNTER — Other Ambulatory Visit: Payer: Self-pay

## 2018-08-12 VITALS — BP 111/74 | HR 77 | Temp 97.3°F | Ht 62.5 in | Wt 163.2 lb

## 2018-08-12 DIAGNOSIS — K5900 Constipation, unspecified: Secondary | ICD-10-CM

## 2018-08-12 NOTE — Assessment & Plan Note (Signed)
Linzess 145 mcg overshooting the mark. Will trial Linzess 72 mcg daily. If this is not helpful and still with diarrhea, I have given her samples of Amitiza 8 mcg to try BID with food. Return in 6 months or sooner if needed. She is to call with progress report, and we will send in prescription for whichever agent works best.

## 2018-08-12 NOTE — Patient Instructions (Signed)
It was great to see you today!  I would like for you to stop Linzess 145 micrograms. Instead, I have given you the lower dosage of 72 micrograms. Take this once each morning on an empty stomach. Give this a few days, and if it is still too strong, then stop and try Amitiza.  Amitiza will be the next sample you try if Linzess is not helpful. This medication needs to be taken WITH FOOD, otherwise you could have nausea. You will take Amitiza twice a day with food.   Do not take both Linzess and Amitiza together.  Call me to update on which one works best!  We will see you in 6 months!

## 2018-08-12 NOTE — Progress Notes (Signed)
cc'ed to pcp °

## 2018-08-13 DIAGNOSIS — E1165 Type 2 diabetes mellitus with hyperglycemia: Secondary | ICD-10-CM | POA: Diagnosis not present

## 2018-08-13 DIAGNOSIS — G4739 Other sleep apnea: Secondary | ICD-10-CM | POA: Diagnosis not present

## 2018-08-13 DIAGNOSIS — I119 Hypertensive heart disease without heart failure: Secondary | ICD-10-CM | POA: Diagnosis not present

## 2018-08-13 DIAGNOSIS — I2510 Atherosclerotic heart disease of native coronary artery without angina pectoris: Secondary | ICD-10-CM | POA: Diagnosis not present

## 2018-08-27 ENCOUNTER — Other Ambulatory Visit (HOSPITAL_COMMUNITY): Payer: Self-pay | Admitting: Hematology

## 2018-08-27 DIAGNOSIS — D509 Iron deficiency anemia, unspecified: Secondary | ICD-10-CM

## 2018-10-15 ENCOUNTER — Other Ambulatory Visit (HOSPITAL_COMMUNITY): Payer: Self-pay | Admitting: *Deleted

## 2018-10-15 DIAGNOSIS — D509 Iron deficiency anemia, unspecified: Secondary | ICD-10-CM

## 2018-10-15 DIAGNOSIS — D500 Iron deficiency anemia secondary to blood loss (chronic): Secondary | ICD-10-CM

## 2018-10-18 ENCOUNTER — Other Ambulatory Visit: Payer: Self-pay

## 2018-10-18 ENCOUNTER — Inpatient Hospital Stay (HOSPITAL_COMMUNITY): Payer: PPO | Attending: Hematology

## 2018-10-18 DIAGNOSIS — D509 Iron deficiency anemia, unspecified: Secondary | ICD-10-CM | POA: Insufficient documentation

## 2018-10-18 DIAGNOSIS — D500 Iron deficiency anemia secondary to blood loss (chronic): Secondary | ICD-10-CM

## 2018-10-18 LAB — CBC WITH DIFFERENTIAL/PLATELET
Abs Immature Granulocytes: 0.02 10*3/uL (ref 0.00–0.07)
Basophils Absolute: 0 10*3/uL (ref 0.0–0.1)
Basophils Relative: 1 %
Eosinophils Absolute: 0.4 10*3/uL (ref 0.0–0.5)
Eosinophils Relative: 5 %
HCT: 40.1 % (ref 36.0–46.0)
Hemoglobin: 11.9 g/dL — ABNORMAL LOW (ref 12.0–15.0)
Immature Granulocytes: 0 %
Lymphocytes Relative: 18 %
Lymphs Abs: 1.3 10*3/uL (ref 0.7–4.0)
MCH: 23.3 pg — ABNORMAL LOW (ref 26.0–34.0)
MCHC: 29.7 g/dL — ABNORMAL LOW (ref 30.0–36.0)
MCV: 78.5 fL — ABNORMAL LOW (ref 80.0–100.0)
Monocytes Absolute: 0.7 10*3/uL (ref 0.1–1.0)
Monocytes Relative: 9 %
Neutro Abs: 5.1 10*3/uL (ref 1.7–7.7)
Neutrophils Relative %: 67 %
Platelets: 282 10*3/uL (ref 150–400)
RBC: 5.11 MIL/uL (ref 3.87–5.11)
RDW: 16 % — ABNORMAL HIGH (ref 11.5–15.5)
WBC: 7.5 10*3/uL (ref 4.0–10.5)
nRBC: 0 % (ref 0.0–0.2)

## 2018-10-18 LAB — FERRITIN: Ferritin: 20 ng/mL (ref 11–307)

## 2018-10-18 LAB — COMPREHENSIVE METABOLIC PANEL
ALT: 17 U/L (ref 0–44)
AST: 23 U/L (ref 15–41)
Albumin: 4.1 g/dL (ref 3.5–5.0)
Alkaline Phosphatase: 64 U/L (ref 38–126)
Anion gap: 9 (ref 5–15)
BUN: 18 mg/dL (ref 8–23)
CO2: 29 mmol/L (ref 22–32)
Calcium: 9.5 mg/dL (ref 8.9–10.3)
Chloride: 105 mmol/L (ref 98–111)
Creatinine, Ser: 1.01 mg/dL — ABNORMAL HIGH (ref 0.44–1.00)
GFR calc Af Amer: >60 mL/min (ref >60)
GFR calc non Af Amer: 54 mL/min — ABNORMAL LOW (ref >60)
Glucose, Bld: 99 mg/dL (ref 70–99)
Potassium: 5 mmol/L (ref 3.5–5.1)
Sodium: 143 mmol/L (ref 135–145)
Total Bilirubin: 0.6 mg/dL (ref 0.3–1.2)
Total Protein: 7 g/dL (ref 6.5–8.1)

## 2018-10-18 LAB — LACTATE DEHYDROGENASE: LDH: 173 U/L (ref 98–192)

## 2018-10-19 ENCOUNTER — Other Ambulatory Visit: Payer: Self-pay

## 2018-10-19 ENCOUNTER — Inpatient Hospital Stay (HOSPITAL_BASED_OUTPATIENT_CLINIC_OR_DEPARTMENT_OTHER): Payer: PPO | Admitting: Nurse Practitioner

## 2018-10-19 DIAGNOSIS — D509 Iron deficiency anemia, unspecified: Secondary | ICD-10-CM | POA: Diagnosis not present

## 2018-10-19 NOTE — Assessment & Plan Note (Addendum)
1.  Microcytic anemia: - This is felt secondary to thalassemia minor.  Patient has no M spike. - She received Feraheme on 12/08/2016. - Labs done on 10/18/2018 showed her hemoglobin at 11.9, platelets at 282, ferritin at 20 and LDH is 173.  Creatinine is 1.01. - She denies any bright red bleeding per rectum or melena. -Patient states she is feeling well she has plenty of energy.  She reports she has been lazy over the past month but is going to start exercising again. - Patient is still taking MVI with iron daily. -We will see her back in 4 months with repeat labs.

## 2018-10-19 NOTE — Progress Notes (Signed)
Nancy Liu, Nancy Liu   CLINIC:  Medical Oncology/Hematology  PCP:  Lucianne Lei, MD [address] [place]  [id number] [phone]   REASON FOR VISIT: Follow-up for microcytic anemia  CURRENT THERAPY: MVI with iron   INTERVAL HISTORY:  Nancy Liu 78 y.o. female returns for routine follow-up for microcytic anemia.  She reports she has been doing well since her last visit.  She denies any bright red bleeding per rectum or melena.  She reports she has been lazy and laying around more but she has energy and feels fine.  She reports she is trying to start exercising again. Denies any nausea, vomiting, or diarrhea. Denies any new pains. Had not noticed any recent bleeding such as epistaxis, hematuria or hematochezia. Denies recent chest pain on exertion, shortness of breath on minimal exertion, pre-syncopal episodes, or palpitations. Denies any numbness or tingling in hands or feet. Denies any recent fevers, infections, or recent hospitalizations. Patient reports appetite at 50 % and energy level at 75 %.  She is eating well and maintaining her weight at this time.    REVIEW OF SYSTEMS:  Review of Systems  Neurological: Positive for numbness.  All other systems reviewed and are negative.    PAST MEDICAL/SURGICAL HISTORY:  Past Medical History:  Diagnosis Date  . Arteriosclerotic cardiovascular disease (ASCVD) 2001   Bare-metal stent placed in the right coronary artery in 12/01; residual 50% lesion of the first diagonal and mid LAD  . Cerebrovascular disease   . Cyst, dermoid, arm, left 03/26/2018  . Diabetes mellitus    excellent control with a low-dose of a single oral agent  . GERD (gastroesophageal reflux disease)    with ulcers  . History of right coronary artery stent placement 2000  . Hyperlipidemia   . Hypertension   . Thalassemia minor   . Tobacco abuse, in remission    35 pack years; Quit in 1980   Past Surgical  History:  Procedure Laterality Date  . COLONOSCOPY  2006   Dr. Collene Mares: normal  . COLONOSCOPY  2000   Dr. Everlean Cherry: normal   . COLONOSCOPY N/A 03/03/2016   Dr. Oneida Alar: diverticulosis, non-bleeding hemorrhoids, redundant left colon.  Marland Kitchen DILATION AND CURETTAGE OF UTERUS    . ESOPHAGOGASTRODUODENOSCOPY  2000   Dr. Everlean Cherry: gastritis   . ESOPHAGOGASTRODUODENOSCOPY  2006   Dr. Collene Mares: small hiatal hernia, gastritis, negative H.pylori   . ESOPHAGOGASTRODUODENOSCOPY N/A 03/03/2016   Procedure: ESOPHAGOGASTRODUODENOSCOPY (EGD);  Surgeon: Danie Binder, MD;  [facility];  Service: Endoscopy;  Laterality: N/A;  . ESOPHAGOGASTRODUODENOSCOPY N/A 06/03/2016   Dr. Oneida Alar: nonaggressive gastritis due to aspirin use. Previous ulcers had healed.  Marland Kitchen EXCISION OF KELOID Left 03/26/2018   Procedure: EXCISION OF LEFT ARM MASS;  Surgeon: Fanny Skates, MD;  Location: [facility];  Service: General;  Laterality: Left;  . INGUINAL HERNIA REPAIR  1970s   Right  . VAGINAL HYSTERECTOMY  1980   Unilateral oophorectomy     SOCIAL HISTORY:  Social History   Socioeconomic History  . Marital status: Divorced    Spouse name: Not on file  . Number of children: 3  . Years of education: Not on file  . Highest education level: Not on file  Occupational History  . 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 Financial resource strain: Not on file  . Food insecurity:    Worry: Not  on file    Inability: Not on file  . Transportation needs:    Medical: Not on file    Non-medical: Not on file  Tobacco Use  . Smoking status: Former Smoker    Packs/day: 1.00    Years: 18.00    Pack years: 18.00    Types: Cigarettes    Start date: 06/02/1965    Last attempt to quit: 06/09/1980    Years since quitting: 38.3  . Smokeless tobacco: Never Used  Substance and Sexual Activity  . Alcohol use: No    Alcohol/week: 0.0 standard drinks  . Drug use: No  . Sexual activity: Never   Lifestyle  . Physical activity:    Days per week: Not on file    Minutes per session: Not on file  . Stress: Not on file  Relationships  . Social connections:    Talks on phone: Not on file    Gets together: Not on file    Attends religious service: Not on file    Active member of club or organization: Not on file    Attends meetings of clubs or organizations: Not on file    Relationship status: Not on file  . Intimate partner violence:    Fear of current or ex partner: Not on file    Emotionally abused: Not on file    Physically abused: Not on file    Forced sexual activity: Not on file  Other Topics Concern  . Not on file  Social History Narrative   Lives with daughter     FAMILY HISTORY:  Family History  Problem Relation Age of Onset  . Diabetes Sister   . Heart disease Mother        also hypertension and asthma  . Cancer Mother   . Colon cancer Neg Hx   . Colon polyps Neg Hx     CURRENT MEDICATIONS:  Outpatient Encounter Medications as of 10/19/2018  Medication Sig  . amLODipine (NORVASC) 10 MG tablet TAKE 1 TABLET BY MOUTH ONCE DAILY  . aspirin 81 MG tablet Take 81 mg by mouth daily.    . carvedilol (COREG) 6.25 MG tablet Take 6.25 mg by mouth 2 (two) times daily.  . Hypromellose (ISOPTO TEARS) 0.5 % SOLN Place 1 drop into both eyes 3 (three) times daily as needed (for dry eyes).  . irbesartan (AVAPRO) 150 MG tablet Take 1 tablet (150 mg total) by mouth daily.  . lansoprazole (PREVACID) 30 MG capsule Take 1 capsule (30 mg total) by mouth daily before breakfast.  . Latanoprostene Bunod (VYZULTA) 0.024 % SOLN Place 1 drop into both eyes daily.  Marland Kitchen linaclotide (LINZESS) 145 MCG CAPS capsule Take 145 mcg by mouth. Doesn't take everyday  . metFORMIN (GLUCOPHAGE) 500 MG tablet Take 500 mg by mouth daily with breakfast.  . Multiple Vitamin (MULTIVITAMIN) tablet Take 1 tablet by mouth daily.  Marland Kitchen POLY-IRON 150 FORTE 150-25-1 MG-MCG-MG CAPS Take 1 capsule by mouth once daily   . Probiotic Product (RESTORA PO) Take 1 tablet by mouth daily.  . rosuvastatin (CRESTOR) 20 MG tablet TAKE 1 TABLET BY MOUTH ONCE DAILY   No facility-administered encounter medications on file as of 10/19/2018.     ALLERGIES:  Allergies  Allergen Reactions  . Protonix [Pantoprazole Sodium]     DRY LIPS  . Shellfish Allergy     Unknown reaction     PHYSICAL EXAM:  ECOG Performance status: 1  Vitals:   10/19/18 0827  BP: (!) 144/81  Pulse: 74  Resp: 16  Temp: 97.9 F (36.6 C)  SpO2: 100%   Filed Weights   10/19/18 0827  Weight: 161 lb 11.2 oz (73.3 kg)    Physical Exam Constitutional:      Appearance: Normal appearance. She is normal weight.  Cardiovascular:     Rate and Rhythm: Normal rate and regular rhythm.     Heart sounds: Normal heart sounds.  Pulmonary:     Effort: Pulmonary effort is normal.     Breath sounds: Normal breath sounds.  Abdominal:     General: Bowel sounds are normal.     Palpations: Abdomen is soft.  Musculoskeletal: Normal range of motion.  Skin:    General: Skin is warm and dry.  Neurological:     Mental Status: She is alert and oriented to person, place, and time. Mental status is at baseline.  Psychiatric:        Mood and Affect: Mood normal.        Behavior: Behavior normal.        Thought Content: Thought content normal.        Judgment: Judgment normal.      LABORATORY DATA:  I have reviewed the labs as listed.  CBC    Component Value Date/Time   WBC 7.5 10/18/2018 0929   RBC 5.11 10/18/2018 0929   HGB 11.9 (L) 10/18/2018 0929   HCT 40.1 10/18/2018 0929   PLT 282 10/18/2018 0929   MCV 78.5 (L) 10/18/2018 0929   MCH 23.3 (L) 10/18/2018 0929   MCHC 29.7 (L) 10/18/2018 0929   RDW 16.0 (H) 10/18/2018 0929   LYMPHSABS 1.3 10/18/2018 0929   MONOABS 0.7 10/18/2018 0929   EOSABS 0.4 10/18/2018 0929   BASOSABS 0.0 10/18/2018 0929   CMP Latest Ref Rng & Units 10/18/2018 04/13/2018 03/23/2018  Glucose 70 - 99 mg/dL 99 91 90   BUN 8 - 23 mg/dL 18 14 10   Creatinine 0.44 - 1.00 mg/dL 1.01(H) 0.83 0.90  Sodium 135 - 145 mmol/L 143 139 140  Potassium 3.5 - 5.1 mmol/L 5.0 4.0 4.2  Chloride 98 - 111 mmol/L 105 103 106  CO2 22 - 32 mmol/L 29 28 28   Calcium 8.9 - 10.3 mg/dL 9.5 9.1 9.3  Total Protein 6.5 - 8.1 g/dL 7.0 7.1 -  Total Bilirubin 0.3 - 1.2 mg/dL 0.6 0.7 -  Alkaline Phos 38 - 126 U/L 64 63 -  AST 15 - 41 U/L 23 21 -  ALT 0 - 44 U/L 17 13 -   I personally performed a face-to-face visit, made revisions and my assessment and plan is as follows.  All questions were answered to patient's stated satisfaction. Encouraged patient to call with any new concerns or questions before his next visit to the cancer center and we can certain see him sooner, if needed.     ASSESSMENT & PLAN:   Microcytic anemia 1.  Microcytic anemia: - This is felt secondary to thalassemia minor.  Patient has no M spike. - She received Feraheme on 12/08/2016. - Labs done on 10/18/2018 showed her hemoglobin at 11.9, platelets at 282, ferritin at 20 and LDH is 173.  Creatinine is 1.01. - She denies any bright red bleeding per rectum or melena. -Patient states she is feeling well she has plenty of energy.  She reports she has been lazy over the past month but is going to start exercising again. - Patient is still taking MVI with iron daily. -We will see her  back in 4 months with repeat labs.      Orders placed this encounter:  Orders Placed This Encounter  Procedures  . Lactate dehydrogenase  . CBC with Differential/Platelet  . Comprehensive metabolic panel  . Ferritin  . Iron and TIBC  . Vitamin B12  . VITAMIN D 25 Hydroxy (Vit-D Deficiency, Fractures)  . Folate      Francene Finders, FNP-C [place] [phone]

## 2018-10-19 NOTE — Patient Instructions (Signed)
[facility] at [facility] Discharge Instructions Follow up in 4 months with labs    Thank you for choosing [facility] at [facility] to provide your oncology and hematology care.  To afford each patient quality time with our provider, please arrive at least 15 minutes before your scheduled appointment time.   If you have a lab appointment with the Cancer Center please come in thru the  Main Entrance and check in at the main information desk  You need to re-schedule your appointment should you arrive 10 or more minutes late.  We strive to give you quality time with our providers, and arriving late affects you and other patients whose appointments are after yours.  Also, if you no show three or more times for appointments you may be dismissed from the clinic at the providers discretion.     Again, thank you for choosing [facility].  Our hope is that these requests will decrease the amount of time that you wait before being seen by our physicians.       _____________________________________________________________  Should you have questions after your visit to [facility], please contact our office at [phone] between the hours of 8:00 a.m. and 4:30 p.m.  Voicemails left after 4:00 p.m. will not be returned until the following business day.  For prescription refill requests, have your pharmacy contact our office and allow 72 hours.    Cancer Center Support Programs:   > Cancer Support Group  2nd Tuesday of the month 1pm-2pm, Journey Room    

## 2018-11-16 DIAGNOSIS — L089 Local infection of the skin and subcutaneous tissue, unspecified: Secondary | ICD-10-CM | POA: Diagnosis not present

## 2018-11-16 DIAGNOSIS — L723 Sebaceous cyst: Secondary | ICD-10-CM | POA: Diagnosis not present

## 2018-11-21 ENCOUNTER — Other Ambulatory Visit: Payer: Self-pay | Admitting: Cardiology

## 2018-11-21 ENCOUNTER — Other Ambulatory Visit (HOSPITAL_COMMUNITY): Payer: Self-pay | Admitting: Hematology

## 2018-11-21 DIAGNOSIS — D509 Iron deficiency anemia, unspecified: Secondary | ICD-10-CM

## 2018-12-14 DIAGNOSIS — E7800 Pure hypercholesterolemia, unspecified: Secondary | ICD-10-CM | POA: Diagnosis not present

## 2018-12-14 DIAGNOSIS — I10 Essential (primary) hypertension: Secondary | ICD-10-CM | POA: Diagnosis not present

## 2018-12-14 DIAGNOSIS — E1169 Type 2 diabetes mellitus with other specified complication: Secondary | ICD-10-CM | POA: Diagnosis not present

## 2018-12-14 DIAGNOSIS — E785 Hyperlipidemia, unspecified: Secondary | ICD-10-CM | POA: Diagnosis not present

## 2018-12-14 DIAGNOSIS — L200 Besnier's prurigo: Secondary | ICD-10-CM | POA: Diagnosis not present

## 2018-12-18 ENCOUNTER — Other Ambulatory Visit: Payer: Self-pay | Admitting: Cardiology

## 2018-12-23 ENCOUNTER — Other Ambulatory Visit: Payer: Self-pay

## 2018-12-23 DIAGNOSIS — H401131 Primary open-angle glaucoma, bilateral, mild stage: Secondary | ICD-10-CM | POA: Diagnosis not present

## 2018-12-23 DIAGNOSIS — E119 Type 2 diabetes mellitus without complications: Secondary | ICD-10-CM | POA: Diagnosis not present

## 2018-12-23 DIAGNOSIS — Z20822 Contact with and (suspected) exposure to covid-19: Secondary | ICD-10-CM

## 2018-12-23 DIAGNOSIS — H2513 Age-related nuclear cataract, bilateral: Secondary | ICD-10-CM | POA: Diagnosis not present

## 2018-12-26 LAB — SPECIMEN STATUS REPORT

## 2018-12-26 LAB — NOVEL CORONAVIRUS, NAA: SARS-CoV-2, NAA: NOT DETECTED

## 2018-12-31 ENCOUNTER — Other Ambulatory Visit: Payer: Self-pay

## 2018-12-31 ENCOUNTER — Other Ambulatory Visit (HOSPITAL_COMMUNITY)
Admission: RE | Admit: 2018-12-31 | Discharge: 2018-12-31 | Disposition: A | Discharge status: Home / Self Care | Payer: PPO | Source: Ambulatory Visit | Attending: Gastroenterology | Admitting: Gastroenterology

## 2018-12-31 ENCOUNTER — Telehealth: Payer: Self-pay | Admitting: Gastroenterology

## 2018-12-31 DIAGNOSIS — K921 Melena: Secondary | ICD-10-CM | POA: Diagnosis not present

## 2018-12-31 LAB — CBC WITH DIFFERENTIAL/PLATELET
Abs Immature Granulocytes: 0.02 10*3/uL (ref 0.00–0.07)
Basophils Absolute: 0 10*3/uL (ref 0.0–0.1)
Basophils Relative: 1 %
Eosinophils Absolute: 0.3 10*3/uL (ref 0.0–0.5)
Eosinophils Relative: 4 %
HCT: 40.6 % (ref 36.0–46.0)
Hemoglobin: 12.6 g/dL (ref 12.0–15.0)
Immature Granulocytes: 0 %
Lymphocytes Relative: 18 %
Lymphs Abs: 1.4 10*3/uL (ref 0.7–4.0)
MCH: 24 pg — ABNORMAL LOW (ref 26.0–34.0)
MCHC: 31 g/dL (ref 30.0–36.0)
MCV: 77.3 fL — ABNORMAL LOW (ref 80.0–100.0)
Monocytes Absolute: 0.6 10*3/uL (ref 0.1–1.0)
Monocytes Relative: 8 %
Neutro Abs: 5.6 10*3/uL (ref 1.7–7.7)
Neutrophils Relative %: 69 %
Platelets: 313 10*3/uL (ref 150–400)
RBC: 5.25 MIL/uL — ABNORMAL HIGH (ref 3.87–5.11)
RDW: 15.9 % — ABNORMAL HIGH (ref 11.5–15.5)
WBC: 8 10*3/uL (ref 4.0–10.5)
nRBC: 0 % (ref 0.0–0.2)

## 2018-12-31 MED ORDER — LINACLOTIDE 145 MCG PO CAPS: 145.0000 ug | ORAL_CAPSULE | Freq: Every day | ORAL | 3 refills | Status: DC

## 2018-12-31 NOTE — Telephone Encounter (Signed)
PT said she had BM Tuesday and when she wiped it was very black stool on tissue.  She had stopped taking Prevacid and Linzess because she did not feel that she needed either.  After the black stool she took one of her Linzess and had a good BM but the stool was still black.  She has not had a BM since Tuesday, and she has no more Linzess 145 mcg.  She is not in any pain and not having any light headedness or dizziness.  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  Sending to Roseanne Kaufman, NP who saw pt last in the office.

## 2018-12-31 NOTE — Telephone Encounter (Signed)
[phone] PLEASE CALL PATIENT, CALLED OFFICE AND SAID SHE WAS HAVING BLACK STOOLS.  SLF DID HER PROCEDURE

## 2018-12-31 NOTE — Addendum Note (Signed)
Addended by: Annitta Needs on: 12/31/2018 11:41 AM   Modules accepted: Orders

## 2018-12-31 NOTE — Telephone Encounter (Signed)
Definitely needs to be on Prevacid. History of PUD. Would continue Linzess for constipation. Sending in Alligator 145 mcg.   Check CBC. Monitor for any further black stool. If persistent, needs ED evaluation.

## 2018-12-31 NOTE — Telephone Encounter (Signed)
Pt is aware and I am faxing order to [facility] lab.

## 2019-01-04 NOTE — Progress Notes (Signed)
Pt is aware of results.  She has not seen any dark stool, but she does take daily iron.

## 2019-01-04 NOTE — Progress Notes (Signed)
Hgb is improved from 2 months ago. Is she still seeing dark stool? Any oral iron, pepto, etc? If she is, recommend an office visit this week.

## 2019-01-10 NOTE — Progress Notes (Signed)
Pt is aware.  

## 2019-01-10 NOTE — Progress Notes (Signed)
Oral iron likely causing dark stool. Keep upcoming appt.

## 2019-02-14 ENCOUNTER — Inpatient Hospital Stay (HOSPITAL_COMMUNITY): Payer: PPO | Attending: Hematology

## 2019-02-14 ENCOUNTER — Other Ambulatory Visit: Payer: Self-pay

## 2019-02-14 DIAGNOSIS — Z87891 Personal history of nicotine dependence: Secondary | ICD-10-CM | POA: Insufficient documentation

## 2019-02-14 DIAGNOSIS — E119 Type 2 diabetes mellitus without complications: Secondary | ICD-10-CM | POA: Insufficient documentation

## 2019-02-14 DIAGNOSIS — Z7984 Long term (current) use of oral hypoglycemic drugs: Secondary | ICD-10-CM | POA: Insufficient documentation

## 2019-02-14 DIAGNOSIS — D509 Iron deficiency anemia, unspecified: Secondary | ICD-10-CM | POA: Diagnosis not present

## 2019-02-14 DIAGNOSIS — E785 Hyperlipidemia, unspecified: Secondary | ICD-10-CM | POA: Insufficient documentation

## 2019-02-14 DIAGNOSIS — Z79899 Other long term (current) drug therapy: Secondary | ICD-10-CM | POA: Insufficient documentation

## 2019-02-14 DIAGNOSIS — I10 Essential (primary) hypertension: Secondary | ICD-10-CM | POA: Insufficient documentation

## 2019-02-14 DIAGNOSIS — K5900 Constipation, unspecified: Secondary | ICD-10-CM | POA: Diagnosis not present

## 2019-02-14 DIAGNOSIS — Z7982 Long term (current) use of aspirin: Secondary | ICD-10-CM | POA: Insufficient documentation

## 2019-02-14 DIAGNOSIS — E559 Vitamin D deficiency, unspecified: Secondary | ICD-10-CM | POA: Insufficient documentation

## 2019-02-14 LAB — COMPREHENSIVE METABOLIC PANEL
ALT: 11 U/L (ref 0–44)
AST: 18 U/L (ref 15–41)
Albumin: 4.2 g/dL (ref 3.5–5.0)
Alkaline Phosphatase: 63 U/L (ref 38–126)
Anion gap: 10 (ref 5–15)
BUN: 18 mg/dL (ref 8–23)
CO2: 27 mmol/L (ref 22–32)
Calcium: 9.2 mg/dL (ref 8.9–10.3)
Chloride: 103 mmol/L (ref 98–111)
Creatinine, Ser: 1.01 mg/dL — ABNORMAL HIGH (ref 0.44–1.00)
GFR calc Af Amer: >60 mL/min (ref >60)
GFR calc non Af Amer: 54 mL/min — ABNORMAL LOW (ref >60)
Glucose, Bld: 95 mg/dL (ref 70–99)
Potassium: 3.9 mmol/L (ref 3.5–5.1)
Sodium: 140 mmol/L (ref 135–145)
Total Bilirubin: 0.7 mg/dL (ref 0.3–1.2)
Total Protein: 6.8 g/dL (ref 6.5–8.1)

## 2019-02-14 LAB — CBC WITH DIFFERENTIAL/PLATELET
Abs Immature Granulocytes: 0.02 10*3/uL (ref 0.00–0.07)
Basophils Absolute: 0 10*3/uL (ref 0.0–0.1)
Basophils Relative: 1 %
Eosinophils Absolute: 0.3 10*3/uL (ref 0.0–0.5)
Eosinophils Relative: 4 %
HCT: 42.1 % (ref 36.0–46.0)
Hemoglobin: 12.8 g/dL (ref 12.0–15.0)
Immature Granulocytes: 0 %
Lymphocytes Relative: 19 %
Lymphs Abs: 1.6 10*3/uL (ref 0.7–4.0)
MCH: 24 pg — ABNORMAL LOW (ref 26.0–34.0)
MCHC: 30.4 g/dL (ref 30.0–36.0)
MCV: 78.8 fL — ABNORMAL LOW (ref 80.0–100.0)
Monocytes Absolute: 0.7 10*3/uL (ref 0.1–1.0)
Monocytes Relative: 8 %
Neutro Abs: 5.8 10*3/uL (ref 1.7–7.7)
Neutrophils Relative %: 68 %
Platelets: 276 10*3/uL (ref 150–400)
RBC: 5.34 MIL/uL — ABNORMAL HIGH (ref 3.87–5.11)
RDW: 15.8 % — ABNORMAL HIGH (ref 11.5–15.5)
WBC: 8.4 10*3/uL (ref 4.0–10.5)
nRBC: 0 % (ref 0.0–0.2)

## 2019-02-14 LAB — FERRITIN: Ferritin: 15 ng/mL (ref 11–307)

## 2019-02-14 LAB — IRON AND TIBC
Iron: 41 ug/dL (ref 28–170)
Saturation Ratios: 10 % — ABNORMAL LOW (ref 10.4–31.8)
TIBC: 395 ug/dL (ref 250–450)
UIBC: 354 ug/dL

## 2019-02-14 LAB — VITAMIN B12: Vitamin B-12: 778 pg/mL (ref 180–914)

## 2019-02-14 LAB — FOLATE: Folate: 32.6 ng/mL (ref >5.9)

## 2019-02-14 LAB — LACTATE DEHYDROGENASE: LDH: 161 U/L (ref 98–192)

## 2019-02-15 ENCOUNTER — Encounter: Payer: Self-pay | Admitting: Gastroenterology

## 2019-02-15 ENCOUNTER — Other Ambulatory Visit: Payer: Self-pay

## 2019-02-15 ENCOUNTER — Ambulatory Visit: Payer: PPO | Admitting: Gastroenterology

## 2019-02-15 VITALS — BP 116/77 | HR 70 | Temp 97.6°F | Ht 62.5 in | Wt 163.4 lb

## 2019-02-15 DIAGNOSIS — D500 Iron deficiency anemia secondary to blood loss (chronic): Secondary | ICD-10-CM | POA: Diagnosis not present

## 2019-02-15 DIAGNOSIS — K5900 Constipation, unspecified: Secondary | ICD-10-CM | POA: Diagnosis not present

## 2019-02-15 LAB — VITAMIN D 25 HYDROXY (VIT D DEFICIENCY, FRACTURES): Vit D, 25-Hydroxy: 15.9 ng/mL — ABNORMAL LOW (ref 30.0–100.0)

## 2019-02-15 MED ORDER — LINACLOTIDE 145 MCG PO CAPS: 145.0000 ug | ORAL_CAPSULE | Freq: Every day | ORAL | 3 refills | Status: DC

## 2019-02-15 NOTE — Patient Instructions (Signed)
I called [facility], but they will need to run the prescription through first. I have sent in [employer] to their pharmacy to see how it will be covered.  I will talk with Dr. Oneida Alar about the best next option: either a repeat upper endoscopy or a capsule study.  I will also reach out to Dr. Harl Bowie about aspirin.  We will see you in 4 months!  I enjoyed seeing you again today! As you know, I value our relationship and want to provide genuine, compassionate, and quality care. I welcome your feedback. If you receive a survey regarding your visit,  I greatly appreciate you taking time to fill this out. See you next time!  Annitta Needs, PhD, ANP-BC [facility]

## 2019-02-15 NOTE — Progress Notes (Addendum)
REVIEWED. PT HAS KNOWN HISTORY OF SMALL BOWEL AVMs/ANEMIA SINCE 2017(Hb 6.6. HAD BLACK STOOL JUL 2020 WHILE TAKING ASA AND NO PPI. NOW TAKING PPI(Hb JUL 2020 12.6). RECEIVES IVFe PRN. PMHx: CVA/CAD. CONTINUE ASA BENEFITS OUTWEIGH THE RISKS. NO INDICATION FOR GIVENS CAPSULE STUDY AT THIS TIME.  Referring Provider: Lucianne Lei, MD Primary Care Physician:  Lucianne Lei, MD Primary GI: Dr. Oneida Alar   Chief Complaint  Patient presents with  . Diarrhea    every 2-3 days now, stool is very dark    HPI:   Nancy Liu is a 78 y.o. female presenting today with a history of IDAanddiarrhea.EGD and colonoscopy in October 2017 revealed many non-bleeding gastric ulcers, gastroduodenitis (no H. 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 Diverticulosis and non-bleeding hemorrhoids. Repeat EGD in January 2018 showederosivegastritis butulcers had healed.  Dark stool, looks black with wiping. On oral iron. Started seeing darker stool about 6 months ago but had been on iron chronically already. No abdominal pain. Takes Linzess every other day and will have a BM 2-3 times per week. If taking Linzess every day, will have diarrhea. Linzess 72 mcg didn't work well.   Hgb stable. Ferritin drifting over past few months, now 15.    Past Medical History:  Diagnosis Date  . Arteriosclerotic cardiovascular disease (ASCVD) 2001   Bare-metal stent placed in the right coronary artery in 12/01; residual 50% lesion of the first diagonal and mid LAD  . Cerebrovascular disease   . Cyst, dermoid, arm, left 03/26/2018  . Diabetes mellitus    excellent control with a low-dose of a single oral agent  . GERD (gastroesophageal reflux disease)    with ulcers  . History of right coronary artery stent placement 2000  . Hyperlipidemia   . Hypertension   . Thalassemia minor   . Tobacco abuse, in remission    35 pack years; Quit in 1980    Past Surgical History:  Procedure Laterality Date  .  COLONOSCOPY  2006   Dr. Collene Mares: normal  . COLONOSCOPY  2000   Dr. Everlean Cherry: normal   . COLONOSCOPY N/A 03/03/2016   Dr. Oneida Alar: diverticulosis, non-bleeding hemorrhoids, redundant left colon.  Marland Kitchen DILATION AND CURETTAGE OF UTERUS    . ESOPHAGOGASTRODUODENOSCOPY  2000   Dr. Everlean Cherry: gastritis   . ESOPHAGOGASTRODUODENOSCOPY  2006   Dr. Collene Mares: small hiatal hernia, gastritis, negative H.pylori   . ESOPHAGOGASTRODUODENOSCOPY N/A 03/03/2016   Procedure: ESOPHAGOGASTRODUODENOSCOPY (EGD);  Surgeon: Danie Binder, MD;  [facility];  Service: Endoscopy;  Laterality: N/A;  . ESOPHAGOGASTRODUODENOSCOPY N/A 06/03/2016   Dr. Oneida Alar: nonaggressive gastritis due to aspirin use. Previous ulcers had healed.  Marland Kitchen EXCISION OF KELOID Left 03/26/2018   Procedure: EXCISION OF LEFT ARM MASS;  Surgeon: Fanny Skates, MD;  Location: [facility];  Service: General;  Laterality: Left;  . INGUINAL HERNIA REPAIR  1970s   Right  . VAGINAL HYSTERECTOMY  1980   Unilateral oophorectomy    Current Outpatient Medications  Medication Sig Dispense Refill  . amLODipine (NORVASC) 10 MG tablet Take 1 tablet by mouth once daily 90 tablet 3  . aspirin 81 MG tablet Take 81 mg by mouth daily.      . carvedilol (COREG) 6.25 MG tablet Take 6.25 mg by mouth 2 (two) times daily.    . Hypromellose (ISOPTO TEARS) 0.5 % SOLN Place 1 drop into both eyes 3 (three) times daily as needed (for dry eyes).    . irbesartan (AVAPRO)  150 MG tablet Take 1 tablet (150 mg total) by mouth daily. 90 tablet 3  . lansoprazole (PREVACID) 30 MG capsule Take 1 capsule (30 mg total) by mouth daily before breakfast. 90 capsule 3  . Latanoprostene Bunod (VYZULTA) 0.024 % SOLN Place 1 drop into both eyes daily.    Marland Kitchen linaclotide (LINZESS) 145 MCG CAPS capsule Take 1 capsule (145 mcg total) by mouth daily before breakfast. (Patient taking differently: Take 145 mcg by mouth every other day. ) 90 capsule 3  . metFORMIN (GLUCOPHAGE) 500 MG tablet  Take 500 mg by mouth daily with breakfast.    . Multiple Vitamin (MULTIVITAMIN) tablet Take 1 tablet by mouth daily.    Marland Kitchen POLY-IRON 150 FORTE 150-25-1 MG-MCG-MG CAPS Take 1 capsule by mouth once daily 90 capsule 0  . Probiotic Product (RESTORA PO) Take 1 tablet by mouth daily.    . rosuvastatin (CRESTOR) 20 MG tablet Take 1 tablet by mouth once daily 90 tablet 0  . linaclotide (LINZESS) 145 MCG CAPS capsule Take 145 mcg by mouth. Doesn't take everyday     No current facility-administered medications for this visit.     Allergies as of 02/15/2019 - Review Complete 02/15/2019  Allergen Reaction Noted  . Protonix [pantoprazole sodium]  03/27/2016  . Shellfish allergy  02/18/2011    Family History  Problem Relation Age of Onset  . Diabetes Sister   . Heart disease Mother        also hypertension and asthma  . Cancer Mother   . Colon cancer Neg Hx   . Colon polyps Neg Hx     Social History   Socioeconomic History  . Marital status: Divorced    Spouse name: Not on file  . Number of children: 3  . Years of education: Not on file  . Highest education level: Not on file  Occupational History  . 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 Financial resource strain: Not on file  . Food insecurity    Worry: Not on file    Inability: Not on file  . Transportation needs    Medical: Not on file    Non-medical: Not on file  Tobacco Use  . Smoking status: Former Smoker    Packs/day: 1.00    Years: 18.00    Pack years: 18.00    Types: Cigarettes    Start date: 06/02/1965    Quit date: 06/09/1980    Years since quitting: 38.7  . Smokeless tobacco: Never Used  Substance and Sexual Activity  . Alcohol use: No    Alcohol/week: 0.0 standard drinks  . Drug use: No  . Sexual activity: Never  Lifestyle  . Physical activity    Days per week: Not on file    Minutes per session: Not on file  . Stress: Not on file  Relationships  . Social Herbalist on  phone: Not on file    Gets together: Not on file    Attends religious service: Not on file    Active member of club or organization: Not on file    Attends meetings of clubs or organizations: Not on file    Relationship status: Not on file  Other Topics Concern  . Not on file  Social History Narrative   Lives with daughter     Review of Systems: Gen: Denies fever, chills, anorexia. Denies fatigue, weakness, weight loss.  CV: Denies chest pain, palpitations, syncope, peripheral edema,  and claudication. Resp: Denies dyspnea at rest, cough, wheezing, coughing up blood, and pleurisy. GI: see HPI Derm: Denies rash, itching, dry skin Psych: Denies depression, anxiety, memory loss, confusion. No homicidal or suicidal ideation.  Heme: see HPI  Physical Exam: BP 116/77   Pulse 70   Temp 97.6 F (36.4 C) (Oral)   Ht 5' 2.5" (1.588 m)   Wt 163 lb 6.4 oz (74.1 kg)   BMI 29.41 kg/m  General:   Alert and oriented. No distress noted. Pleasant and cooperative.  Head:  Normocephalic and atraumatic. Abdomen:  +BS, soft, non-tender and non-distended. No rebound or guarding. No HSM or masses noted. Msk:  Symmetrical without gross deformities. Normal posture. Extremities:  Without edema. Neurologic:  Alert and  oriented x4 Psych:  Alert and cooperative. Normal mood and affect.

## 2019-02-17 NOTE — Assessment & Plan Note (Signed)
Linzess 145 mcg sent to pharmacy.  

## 2019-02-17 NOTE — Assessment & Plan Note (Signed)
78 year old female with history of PUD and AVMs, with last EGD in 2018 showing healing. Continues on 81 mg aspirin and PPI. Reports of black stool on oral iron, although she has been on oral iron chronically. Drifting ferritin but Hgb stable. Discussed EGD vs capsule with patient. Will discuss with Dr. Oneida Alar pursuing capsule study to wrap up IDA evaluation vs EGD first. Will also reach out to Dr. Harl Bowie regarding possibly discontinuing aspirin if appropriate.

## 2019-02-20 NOTE — Progress Notes (Signed)
CC'ED TO PCP 

## 2019-02-21 ENCOUNTER — Inpatient Hospital Stay (HOSPITAL_BASED_OUTPATIENT_CLINIC_OR_DEPARTMENT_OTHER): Payer: PPO | Admitting: Nurse Practitioner

## 2019-02-21 ENCOUNTER — Other Ambulatory Visit: Payer: Self-pay

## 2019-02-21 DIAGNOSIS — D509 Iron deficiency anemia, unspecified: Secondary | ICD-10-CM | POA: Diagnosis not present

## 2019-02-21 MED ORDER — ERGOCALCIFEROL 1.25 MG (50000 UT) PO CAPS: 50000.0000 [IU] | ORAL_CAPSULE | ORAL | 3 refills | Status: DC

## 2019-02-21 NOTE — Patient Instructions (Signed)
[facility] at [facility] Discharge Instructions  Follow up in 4 months with labs    Thank you for choosing [facility] at [facility] to provide your oncology and hematology care.  To afford each patient quality time with our provider, please arrive at least 15 minutes before your scheduled appointment time.   If you have a lab appointment with the Cancer Center please come in thru the Main Entrance and check in at the main information desk.  You need to re-schedule your appointment should you arrive 10 or more minutes late.  We strive to give you quality time with our providers, and arriving late affects you and other patients whose appointments are after yours.  Also, if you no show three or more times for appointments you may be dismissed from the clinic at the providers discretion.     Again, thank you for choosing [facility].  Our hope is that these requests will decrease the amount of time that you wait before being seen by our physicians.       _____________________________________________________________  Should you have questions after your visit to  Cancer Center, please contact our office at [phone] between the hours of 8:00 a.m. and 4:30 p.m.  Voicemails left after 4:00 p.m. will not be returned until the following business day.  For prescription refill requests, have your pharmacy contact our office and allow 72 hours.    Due to Covid, you will need to wear a mask upon entering the hospital. If you do not have a mask, a mask will be given to you at the Main Entrance upon arrival. For doctor visits, patients may have 1 support person with them. For treatment visits, patients can not have anyone with them due to social distancing guidelines and our immunocompromised population.      

## 2019-02-21 NOTE — Progress Notes (Signed)
[place], [place] [id number]   CLINIC:  Medical Oncology/Hematology  PCP:  Lucianne Lei, MD [address] [place] [address] [phone]   REASON FOR VISIT: Follow-up for microcytic anemia  CURRENT THERAPY: Intermittent iron infusions   INTERVAL HISTORY:  Ms. Winns 78 y.o. female returns for routine follow-up for microcytic anemia.  She reports she has been doing well since her last visit.  She she reports her energy level is good at this time.  She denies any bright red bleeding per rectum or melena.  She does have darker stools due to the iron tablets that she is taking.  She has recently saw her GI doctor and received a good checkup.  She does have occasional constipation.  Denies any nausea, vomiting, or diarrhea. Denies any new pains. Had not noticed any recent bleeding such as epistaxis, hematuria or hematochezia. Denies recent chest pain on exertion, shortness of breath on minimal exertion, pre-syncopal episodes, or palpitations. Denies any numbness or tingling in hands or feet. Denies any recent fevers, infections, or recent hospitalizations. Patient reports appetite at 75% and energy level at 50%.  She is eating well maintaining her weight at this time.    REVIEW OF SYSTEMS:  Review of Systems  Gastrointestinal: Positive for constipation.  Neurological: Positive for numbness.  All other systems reviewed and are negative.    PAST MEDICAL/SURGICAL HISTORY:  Past Medical History:  Diagnosis Date  . Arteriosclerotic cardiovascular disease (ASCVD) 2001   Bare-metal stent placed in the right coronary artery in 12/01; residual 50% lesion of the first diagonal and mid LAD  . Cerebrovascular disease   . Cyst, dermoid, arm, left 03/26/2018  . Diabetes mellitus    excellent control with a low-dose of a single oral agent  . GERD (gastroesophageal reflux disease)    with ulcers  . History of right coronary artery stent placement  2000  . Hyperlipidemia   . Hypertension   . Thalassemia minor   . Tobacco abuse, in remission    35 pack years; Quit in 1980   Past Surgical History:  Procedure Laterality Date  . COLONOSCOPY  2006   Dr. Collene Mares: normal  . COLONOSCOPY  2000   Dr. Everlean Cherry: normal   . COLONOSCOPY N/A 03/03/2016   Dr. Oneida Alar: diverticulosis, non-bleeding hemorrhoids, redundant left colon.  Marland Kitchen DILATION AND CURETTAGE OF UTERUS    . ESOPHAGOGASTRODUODENOSCOPY  2000   Dr. Everlean Cherry: gastritis   . ESOPHAGOGASTRODUODENOSCOPY  2006   Dr. Collene Mares: small hiatal hernia, gastritis, negative H.pylori   . ESOPHAGOGASTRODUODENOSCOPY N/A 03/03/2016   Procedure: ESOPHAGOGASTRODUODENOSCOPY (EGD);  Surgeon: Danie Binder, MD;  [facility];  Service: Endoscopy;  Laterality: N/A;  . ESOPHAGOGASTRODUODENOSCOPY N/A 06/03/2016   Dr. Oneida Alar: nonaggressive gastritis due to aspirin use. Previous ulcers had healed.  Marland Kitchen EXCISION OF KELOID Left 03/26/2018   Procedure: EXCISION OF LEFT ARM MASS;  Surgeon: Fanny Skates, MD;  Location: [facility];  Service: General;  Laterality: Left;  . INGUINAL HERNIA REPAIR  1970s   Right  . VAGINAL HYSTERECTOMY  1980   Unilateral oophorectomy     SOCIAL HISTORY:  Social History   Socioeconomic History  . Marital status: Divorced    Spouse name: Not on file  . Number of children: 3  . Years of education: Not on file  . Highest education level: Not on file  Occupational History  . 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 Financial resource strain: Not on file  . Food insecurity    Worry: Not on file    Inability: Not on file  . Transportation needs    Medical: Not on file    Non-medical: Not on file  Tobacco Use  . Smoking status: Former Smoker    Packs/day: 1.00    Years: 18.00    Pack years: 18.00    Types: Cigarettes    Start date: 06/02/1965    Quit date: 06/09/1980    Years since quitting: 38.7  . Smokeless tobacco: Never Used   Substance and Sexual Activity  . Alcohol use: No    Alcohol/week: 0.0 standard drinks  . Drug use: No  . Sexual activity: Never  Lifestyle  . Physical activity    Days per week: Not on file    Minutes per session: Not on file  . Stress: Not on file  Relationships  . Social Herbalist on phone: Not on file    Gets together: Not on file    Attends religious service: Not on file    Active member of club or organization: Not on file    Attends meetings of clubs or organizations: Not on file    Relationship status: Not on file  . Intimate partner violence    Fear of current or ex partner: Not on file    Emotionally abused: Not on file    Physically abused: Not on file    Forced sexual activity: Not on file  Other Topics Concern  . Not on file  Social History Narrative   Lives with daughter     FAMILY HISTORY:  Family History  Problem Relation Age of Onset  . Diabetes Sister   . Heart disease Mother        also hypertension and asthma  . Cancer Mother   . Colon cancer Neg Hx   . Colon polyps Neg Hx     CURRENT MEDICATIONS:  Outpatient Encounter Medications as of 02/21/2019  Medication Sig  . amLODipine (NORVASC) 10 MG tablet Take 1 tablet by mouth once daily  . aspirin 81 MG tablet Take 81 mg by mouth daily.    . carvedilol (COREG) 6.25 MG tablet Take 6.25 mg by mouth 2 (two) times daily.  . irbesartan (AVAPRO) 150 MG tablet Take 1 tablet (150 mg total) by mouth daily.  . irbesartan-hydrochlorothiazide (AVALIDE) 300-12.5 MG tablet Take 1 tablet by mouth daily.   . lansoprazole (PREVACID) 30 MG capsule Take 1 capsule (30 mg total) by mouth daily before breakfast.  . Latanoprostene Bunod (VYZULTA) 0.024 % SOLN Place 1 drop into both eyes daily.  Marland Kitchen linaclotide (LINZESS) 145 MCG CAPS capsule Take 1 capsule (145 mcg total) by mouth daily before breakfast. (Patient taking differently: Take 145 mcg by mouth every other day. )  . metFORMIN (GLUCOPHAGE) 500 MG tablet  Take 500 mg by mouth daily with breakfast.  . POLY-IRON 150 FORTE 150-25-1 MG-MCG-MG CAPS Take 1 capsule by mouth once daily  . rosuvastatin (CRESTOR) 20 MG tablet Take 1 tablet by mouth once daily  . augmented betamethasone dipropionate (DIPROLENE-AF) 0.05 % ointment Apply topically as needed.   . ergocalciferol (VITAMIN D2) 1.25 MG (50000 UT) capsule Take 1 capsule (50,000 Units total) by mouth once a week.  . Hypromellose (ISOPTO TEARS) 0.5 % SOLN Place 1 drop into both eyes 3 (three) times daily as needed (for dry eyes).  . Multiple Vitamin (  MULTIVITAMIN) tablet Take 1 tablet by mouth as needed.   . [DISCONTINUED] linaclotide (LINZESS) 145 MCG CAPS capsule Take 1 capsule (145 mcg total) by mouth daily before breakfast.  . [DISCONTINUED] metFORMIN (GLUCOPHAGE) 1000 MG tablet   . [DISCONTINUED] Probiotic Product (RESTORA PO) Take 1 tablet by mouth daily.   No facility-administered encounter medications on file as of 02/21/2019.     ALLERGIES:  Allergies  Allergen Reactions  . Protonix [Pantoprazole Sodium]     DRY LIPS  . Shellfish Allergy     Unknown reaction     PHYSICAL EXAM:  ECOG Performance status: 1  Vitals:   02/21/19 0850  BP: 128/84  Pulse: 72  Resp: 18  Temp: (!) 97.3 F (36.3 C)  SpO2: 100%   Filed Weights   02/21/19 0850  Weight: 164 lb 14.4 oz (74.8 kg)    Physical Exam Constitutional:      Appearance: Normal appearance. She is normal weight.  Cardiovascular:     Rate and Rhythm: Normal rate and regular rhythm.     Heart sounds: Normal heart sounds.  Pulmonary:     Effort: Pulmonary effort is normal.     Breath sounds: Normal breath sounds.  Abdominal:     General: Bowel sounds are normal.     Palpations: Abdomen is soft.  Musculoskeletal: Normal range of motion.  Skin:    General: Skin is warm and dry.  Neurological:     Mental Status: She is alert and oriented to person, place, and time. Mental status is at baseline.  Psychiatric:         Mood and Affect: Mood normal.        Behavior: Behavior normal.        Thought Content: Thought content normal.        Judgment: Judgment normal.      LABORATORY DATA:  I have reviewed the labs as listed.  CBC    Component Value Date/Time   WBC 8.4 02/14/2019 0919   RBC 5.34 (H) 02/14/2019 0919   HGB 12.8 02/14/2019 0919   HCT 42.1 02/14/2019 0919   PLT 276 02/14/2019 0919   MCV 78.8 (L) 02/14/2019 0919   MCH 24.0 (L) 02/14/2019 0919   MCHC 30.4 02/14/2019 0919   RDW 15.8 (H) 02/14/2019 0919   LYMPHSABS 1.6 02/14/2019 0919   MONOABS 0.7 02/14/2019 0919   EOSABS 0.3 02/14/2019 0919   BASOSABS 0.0 02/14/2019 0919   CMP Latest Ref Rng & Units 02/14/2019 10/18/2018 04/13/2018  Glucose 70 - 99 mg/dL 95 99 91  BUN 8 - 23 mg/dL 18 18 14   Creatinine 0.44 - 1.00 mg/dL 1.01(H) 1.01(H) 0.83  Sodium 135 - 145 mmol/L 140 143 139  Potassium 3.5 - 5.1 mmol/L 3.9 5.0 4.0  Chloride 98 - 111 mmol/L 103 105 103  CO2 22 - 32 mmol/L 27 29 28   Calcium 8.9 - 10.3 mg/dL 9.2 9.5 9.1  Total Protein 6.5 - 8.1 g/dL 6.8 7.0 7.1  Total Bilirubin 0.3 - 1.2 mg/dL 0.7 0.6 0.7  Alkaline Phos 38 - 126 U/L 63 64 63  AST 15 - 41 U/L 18 23 21   ALT 0 - 44 U/L 11 17 13    I personally performed a face-to-face visit.  All questions were answered to patient's stated satisfaction. Encouraged patient to call with any new concerns or questions before his next visit to the cancer center and we can certain see him sooner, if needed.     ASSESSMENT & PLAN:  Microcytic anemia 1.  Microcytic anemia: - This is felt secondary to thalassemia minor.  Patient has no M spike. - She received Feraheme on 12/08/2016. - She denies any bright red bleeding per rectum or melena. -Patient states she is feeling well she has plenty of energy.  She reports she has been lazy over the past month but is going to start exercising again. - Patient is still taking MVI with iron daily. -Labs done on 02/14/2019 showed her hemoglobin 12.8,  ferritin 15, percent saturation 10, platelets 278, WBC 8.4. -She is recently saw her GI doctor last week and received a good checkup.  She reports her stools are darker but they feel it is due to her daily iron intake. -We will see her back in 4 months with repeat labs.  2.  Vitamin D deficiency: -Labs on 02/14/2019 showed her vitamin D level at 15.9. - I have prescribed vitamin D 50,000 units weekly. -We will recheck her labs at her next visit.      Orders placed this encounter:  Orders Placed This Encounter  Procedures  . Lactate dehydrogenase  . CBC with Differential/Platelet  . Comprehensive metabolic panel  . Ferritin  . Iron and TIBC  . Vitamin B12  . VITAMIN D 25 Hydroxy (Vit-D Deficiency, Fractures)  . Folate      Francene Finders, FNP-C [place] [phone]

## 2019-02-21 NOTE — Assessment & Plan Note (Addendum)
1.  Microcytic anemia: - This is felt secondary to thalassemia minor.  Patient has no M spike. - She received Feraheme on 12/08/2016. - She denies any bright red bleeding per rectum or melena. -Patient states she is feeling well she has plenty of energy.  She reports she has been lazy over the past month but is going to start exercising again. - Patient is still taking MVI with iron daily. -Labs done on 02/14/2019 showed her hemoglobin 12.8, ferritin 15, percent saturation 10, platelets 278, WBC 8.4. -She is recently saw her GI doctor last week and received a good checkup.  She reports her stools are darker but they feel it is due to her daily iron intake. -We will see her back in 4 months with repeat labs.  2.  Vitamin D deficiency: -Labs on 02/14/2019 showed her vitamin D level at 15.9. - I have prescribed vitamin D 50,000 units weekly. -We will recheck her labs at her next visit.

## 2019-03-02 ENCOUNTER — Other Ambulatory Visit (HOSPITAL_COMMUNITY): Payer: Self-pay | Admitting: Hematology

## 2019-03-02 DIAGNOSIS — D509 Iron deficiency anemia, unspecified: Secondary | ICD-10-CM

## 2019-03-15 ENCOUNTER — Telehealth: Payer: Self-pay

## 2019-03-15 NOTE — Telephone Encounter (Signed)
Per Request of Roseanne Kaufman, NP at last OV, I am leaving pt samples of Linzess 145 mcg ( #12) at front desk. She is aware and will be by later today to pick up.

## 2019-03-19 ENCOUNTER — Other Ambulatory Visit: Payer: Self-pay | Admitting: Cardiology

## 2019-03-24 DIAGNOSIS — H401131 Primary open-angle glaucoma, bilateral, mild stage: Secondary | ICD-10-CM | POA: Diagnosis not present

## 2019-03-24 DIAGNOSIS — H2513 Age-related nuclear cataract, bilateral: Secondary | ICD-10-CM | POA: Diagnosis not present

## 2019-03-24 DIAGNOSIS — H04123 Dry eye syndrome of bilateral lacrimal glands: Secondary | ICD-10-CM | POA: Diagnosis not present

## 2019-04-19 ENCOUNTER — Other Ambulatory Visit (HOSPITAL_COMMUNITY): Payer: Self-pay | Admitting: Family Medicine

## 2019-04-19 DIAGNOSIS — K219 Gastro-esophageal reflux disease without esophagitis: Secondary | ICD-10-CM | POA: Diagnosis not present

## 2019-04-19 DIAGNOSIS — E782 Mixed hyperlipidemia: Secondary | ICD-10-CM | POA: Diagnosis not present

## 2019-04-19 DIAGNOSIS — I119 Hypertensive heart disease without heart failure: Secondary | ICD-10-CM | POA: Diagnosis not present

## 2019-04-19 DIAGNOSIS — Z0000 Encounter for general adult medical examination without abnormal findings: Secondary | ICD-10-CM | POA: Diagnosis not present

## 2019-04-19 DIAGNOSIS — Z1231 Encounter for screening mammogram for malignant neoplasm of breast: Secondary | ICD-10-CM

## 2019-04-27 ENCOUNTER — Other Ambulatory Visit: Payer: Self-pay

## 2019-05-05 ENCOUNTER — Ambulatory Visit (HOSPITAL_COMMUNITY)
Admission: RE | Admit: 2019-05-05 | Discharge: 2019-05-05 | Disposition: A | Discharge status: Home / Self Care | Payer: PPO | Source: Ambulatory Visit | Attending: Family Medicine | Admitting: Family Medicine

## 2019-05-05 ENCOUNTER — Other Ambulatory Visit: Payer: Self-pay

## 2019-05-05 DIAGNOSIS — Z1231 Encounter for screening mammogram for malignant neoplasm of breast: Secondary | ICD-10-CM | POA: Diagnosis not present

## 2019-05-19 ENCOUNTER — Ambulatory Visit: Payer: PPO | Attending: Family Medicine

## 2019-05-19 ENCOUNTER — Other Ambulatory Visit: Payer: Self-pay

## 2019-05-19 DIAGNOSIS — Z20828 Contact with and (suspected) exposure to other viral communicable diseases: Secondary | ICD-10-CM | POA: Diagnosis not present

## 2019-05-19 DIAGNOSIS — Z20822 Contact with and (suspected) exposure to covid-19: Secondary | ICD-10-CM

## 2019-05-20 LAB — NOVEL CORONAVIRUS, NAA: SARS-CoV-2, NAA: NOT DETECTED

## 2019-05-22 ENCOUNTER — Other Ambulatory Visit: Payer: Self-pay | Admitting: Cardiology

## 2019-06-02 ENCOUNTER — Telehealth: Payer: Self-pay | Admitting: Cardiology

## 2019-06-02 NOTE — Telephone Encounter (Signed)
Patient has questions about the COVID vaccine

## 2019-06-02 NOTE — Telephone Encounter (Signed)
Pt wanted to know when she could get her Covid vaccine. I advised her her to call PCP.

## 2019-06-13 ENCOUNTER — Ambulatory Visit
Admission: EM | Admit: 2019-06-13 | Discharge: 2019-06-13 | Disposition: A | Discharge status: Home / Self Care | Payer: PPO | Attending: Emergency Medicine | Admitting: Emergency Medicine

## 2019-06-13 ENCOUNTER — Inpatient Hospital Stay (HOSPITAL_COMMUNITY): Payer: PPO | Attending: Hematology

## 2019-06-13 ENCOUNTER — Other Ambulatory Visit: Payer: Self-pay

## 2019-06-13 DIAGNOSIS — I2510 Atherosclerotic heart disease of native coronary artery without angina pectoris: Secondary | ICD-10-CM | POA: Insufficient documentation

## 2019-06-13 DIAGNOSIS — D509 Iron deficiency anemia, unspecified: Secondary | ICD-10-CM | POA: Diagnosis not present

## 2019-06-13 DIAGNOSIS — E119 Type 2 diabetes mellitus without complications: Secondary | ICD-10-CM | POA: Diagnosis not present

## 2019-06-13 DIAGNOSIS — Z20822 Contact with and (suspected) exposure to covid-19: Secondary | ICD-10-CM | POA: Diagnosis not present

## 2019-06-13 DIAGNOSIS — Z8249 Family history of ischemic heart disease and other diseases of the circulatory system: Secondary | ICD-10-CM | POA: Diagnosis not present

## 2019-06-13 DIAGNOSIS — Z79899 Other long term (current) drug therapy: Secondary | ICD-10-CM | POA: Insufficient documentation

## 2019-06-13 DIAGNOSIS — E559 Vitamin D deficiency, unspecified: Secondary | ICD-10-CM | POA: Diagnosis not present

## 2019-06-13 DIAGNOSIS — Z7982 Long term (current) use of aspirin: Secondary | ICD-10-CM | POA: Insufficient documentation

## 2019-06-13 DIAGNOSIS — D563 Thalassemia minor: Secondary | ICD-10-CM | POA: Diagnosis not present

## 2019-06-13 DIAGNOSIS — Z7984 Long term (current) use of oral hypoglycemic drugs: Secondary | ICD-10-CM | POA: Diagnosis not present

## 2019-06-13 DIAGNOSIS — I10 Essential (primary) hypertension: Secondary | ICD-10-CM | POA: Insufficient documentation

## 2019-06-13 DIAGNOSIS — Z833 Family history of diabetes mellitus: Secondary | ICD-10-CM | POA: Diagnosis not present

## 2019-06-13 DIAGNOSIS — Z90710 Acquired absence of both cervix and uterus: Secondary | ICD-10-CM | POA: Insufficient documentation

## 2019-06-13 DIAGNOSIS — Z90721 Acquired absence of ovaries, unilateral: Secondary | ICD-10-CM | POA: Diagnosis not present

## 2019-06-13 DIAGNOSIS — E785 Hyperlipidemia, unspecified: Secondary | ICD-10-CM | POA: Diagnosis not present

## 2019-06-13 DIAGNOSIS — Z87891 Personal history of nicotine dependence: Secondary | ICD-10-CM | POA: Insufficient documentation

## 2019-06-13 LAB — COMPREHENSIVE METABOLIC PANEL
ALT: 13 U/L (ref 0–44)
AST: 19 U/L (ref 15–41)
Albumin: 4.2 g/dL (ref 3.5–5.0)
Alkaline Phosphatase: 62 U/L (ref 38–126)
Anion gap: 12 (ref 5–15)
BUN: 14 mg/dL (ref 8–23)
CO2: 28 mmol/L (ref 22–32)
Calcium: 9.4 mg/dL (ref 8.9–10.3)
Chloride: 99 mmol/L (ref 98–111)
Creatinine, Ser: 1 mg/dL (ref 0.44–1.00)
GFR calc Af Amer: >60 mL/min (ref >60)
GFR calc non Af Amer: 54 mL/min — ABNORMAL LOW (ref >60)
Glucose, Bld: 104 mg/dL — ABNORMAL HIGH (ref 70–99)
Potassium: 3.5 mmol/L (ref 3.5–5.1)
Sodium: 139 mmol/L (ref 135–145)
Total Bilirubin: 0.6 mg/dL (ref 0.3–1.2)
Total Protein: 7.2 g/dL (ref 6.5–8.1)

## 2019-06-13 LAB — VITAMIN B12: Vitamin B-12: 808 pg/mL (ref 180–914)

## 2019-06-13 LAB — IRON AND TIBC
Iron: 41 ug/dL (ref 28–170)
Saturation Ratios: 11 % (ref 10.4–31.8)
TIBC: 359 ug/dL (ref 250–450)
UIBC: 318 ug/dL

## 2019-06-13 LAB — CBC WITH DIFFERENTIAL/PLATELET
Abs Immature Granulocytes: 0.03 10*3/uL (ref 0.00–0.07)
Basophils Absolute: 0 10*3/uL (ref 0.0–0.1)
Basophils Relative: 0 %
Eosinophils Absolute: 0.2 10*3/uL (ref 0.0–0.5)
Eosinophils Relative: 3 %
HCT: 40.7 % (ref 36.0–46.0)
Hemoglobin: 12.4 g/dL (ref 12.0–15.0)
Immature Granulocytes: 0 %
Lymphocytes Relative: 6 %
Lymphs Abs: 0.5 10*3/uL — ABNORMAL LOW (ref 0.7–4.0)
MCH: 23.9 pg — ABNORMAL LOW (ref 26.0–34.0)
MCHC: 30.5 g/dL (ref 30.0–36.0)
MCV: 78.4 fL — ABNORMAL LOW (ref 80.0–100.0)
Monocytes Absolute: 0.9 10*3/uL (ref 0.1–1.0)
Monocytes Relative: 11 %
Neutro Abs: 6.7 10*3/uL (ref 1.7–7.7)
Neutrophils Relative %: 80 %
Platelets: 266 10*3/uL (ref 150–400)
RBC: 5.19 MIL/uL — ABNORMAL HIGH (ref 3.87–5.11)
RDW: 16.2 % — ABNORMAL HIGH (ref 11.5–15.5)
WBC: 8.4 10*3/uL (ref 4.0–10.5)
nRBC: 0 % (ref 0.0–0.2)

## 2019-06-13 LAB — LACTATE DEHYDROGENASE: LDH: 168 U/L (ref 98–192)

## 2019-06-13 LAB — FOLATE: Folate: 29.5 ng/mL (ref >5.9)

## 2019-06-13 LAB — FERRITIN: Ferritin: 28 ng/mL (ref 11–307)

## 2019-06-13 LAB — VITAMIN D 25 HYDROXY (VIT D DEFICIENCY, FRACTURES): Vit D, 25-Hydroxy: 87.1 ng/mL (ref 30–100)

## 2019-06-13 NOTE — ED Triage Notes (Signed)
Pt here for covid test after positive exposure  

## 2019-06-13 NOTE — ED Provider Notes (Signed)
[facility]    CSN: [id number] Arrival date & time: 06/13/19  1613      History   Chief Complaint No chief complaint on file.   HPI Nancy Liu is a 79 y.o. female.   Nancy Liu 79 years old female presented to the urgent care with a complaint of Covid exposure for the past 3 to 5 days.  Denies sick exposure to  flu or strep.  Denies recent travel.  Denies aggravating or alleviating symptoms.  Denies previous COVID infection.   Denies fever, chills, fatigue, nasal congestion, rhinorrhea, sore throat, cough, SOB, wheezing, chest pain, nausea, vomiting, changes in bowel or bladder habits.    The history is provided by the patient. No language interpreter was used.    Past Medical History:  Diagnosis Date  . Arteriosclerotic cardiovascular disease (ASCVD) 2001   Bare-metal stent placed in the right coronary artery in 12/01; residual 50% lesion of the first diagonal and mid LAD  . Cerebrovascular disease   . Cyst, dermoid, arm, left 03/26/2018  . Diabetes mellitus    excellent control with a low-dose of a single oral agent  . GERD (gastroesophageal reflux disease)    with ulcers  . History of right coronary artery stent placement 2000  . Hyperlipidemia   . Hypertension   . Thalassemia minor   . Tobacco abuse, in remission    35 pack years; Quit in 1980    Patient Active Problem List   Diagnosis Date Noted  . Cyst, dermoid, arm, left 03/26/2018  . Diarrhea 08/10/2017  . Constipation 02/10/2017  . AVM (arteriovenous malformation) of small bowel, acquired 02/10/2017  . Iron deficiency anemia due to chronic blood loss 07/23/2016  . Gastritis and gastroduodenitis   . Symptomatic anemia 02/29/2016  . Weight loss 04/01/2012  . Microcytic anemia 01/31/2010  . Cerebrovascular disease 01/31/2010  . Arteriosclerotic cardiovascular disease (ASCVD) 01/03/2009  . HYPERLIPIDEMIA 10/22/2007  . Essential hypertension 10/22/2007    Past Surgical History:   Procedure Laterality Date  . COLONOSCOPY  2006   Dr. Collene Mares: normal  . COLONOSCOPY  2000   Dr. Everlean Cherry: normal   . COLONOSCOPY N/A 03/03/2016   Dr. Oneida Alar: diverticulosis, non-bleeding hemorrhoids, redundant left colon.  Marland Kitchen DILATION AND CURETTAGE OF UTERUS    . ESOPHAGOGASTRODUODENOSCOPY  2000   Dr. Everlean Cherry: gastritis   . ESOPHAGOGASTRODUODENOSCOPY  2006   Dr. Collene Mares: small hiatal hernia, gastritis, negative H.pylori   . ESOPHAGOGASTRODUODENOSCOPY N/A 03/03/2016   Procedure: ESOPHAGOGASTRODUODENOSCOPY (EGD);  Surgeon: Danie Binder, MD;  [facility];  Service: Endoscopy;  Laterality: N/A;  . ESOPHAGOGASTRODUODENOSCOPY N/A 06/03/2016   Dr. Oneida Alar: nonaggressive gastritis due to aspirin use. Previous ulcers had healed.  Marland Kitchen EXCISION OF KELOID Left 03/26/2018   Procedure: EXCISION OF LEFT ARM MASS;  Surgeon: Fanny Skates, MD;  Location: [facility];  Service: General;  Laterality: Left;  . INGUINAL HERNIA REPAIR  1970s   Right  . VAGINAL HYSTERECTOMY  1980   Unilateral oophorectomy    OB History    Gravida  4   Para  3   Term  3   Preterm      AB  1   Living        SAB  1   TAB      Ectopic      Multiple      Live Births               Home Medications  Prior to Admission medications   Medication Sig Start Date End Date Taking? Authorizing Provider  amLODipine (NORVASC) 10 MG tablet Take 1 tablet by mouth once daily 12/20/18   Arnoldo Lenis, MD  aspirin 81 MG tablet Take 81 mg by mouth daily.      [provider]  augmented betamethasone dipropionate (DIPROLENE-AF) 0.05 % ointment Apply topically as needed.  12/14/18   [provider]  carvedilol (COREG) 6.25 MG tablet Take 1 tablet by mouth twice daily 03/21/19   Arnoldo Lenis, MD  ergocalciferol (VITAMIN D2) 1.25 MG (50000 UT) capsule Take 1 capsule (50,000 Units total) by mouth once a week. 02/21/19   Lockamy, Randi L, NP-C  Hypromellose (ISOPTO TEARS) 0.5 % SOLN  Place 1 drop into both eyes 3 (three) times daily as needed (for dry eyes).    [provider]  irbesartan (AVAPRO) 150 MG tablet Take 1 tablet (150 mg total) by mouth daily. 07/31/17   Arnoldo Lenis, MD  irbesartan-hydrochlorothiazide (AVALIDE) 300-12.5 MG tablet Take 1 tablet by mouth daily.  12/18/18   [provider]  lansoprazole (PREVACID) 30 MG capsule Take 1 capsule (30 mg total) by mouth daily before breakfast. 05/13/18   Annitta Needs, NP  Latanoprostene Bunod (VYZULTA) 0.024 % SOLN Place 1 drop into both eyes daily.    [provider]  linaclotide (LINZESS) 145 MCG CAPS capsule Take 1 capsule (145 mcg total) by mouth daily before breakfast. Patient taking differently: Take 145 mcg by mouth every other day.  12/31/18   Annitta Needs, NP  metFORMIN (GLUCOPHAGE) 500 MG tablet Take 500 mg by mouth daily with breakfast.    [provider]  Multiple Vitamin (MULTIVITAMIN) tablet Take 1 tablet by mouth as needed.     [provider]  POLY-IRON 150 FORTE 150-25-1 MG-MCG-MG CAPS Take 1 capsule by mouth once daily 03/02/19   Derek Jack, MD  rosuvastatin (CRESTOR) 20 MG tablet Take 1 tablet by mouth once daily 05/23/19   Arnoldo Lenis, MD    Family History Family History  Problem Relation Age of Onset  . Diabetes Sister   . Heart disease Mother        also hypertension and asthma  . Cancer Mother   . Colon cancer Neg Hx   . Colon polyps Neg Hx     Social History Social History   Tobacco Use  . Smoking status: Former Smoker    Packs/day: 1.00    Years: 18.00    Pack years: 18.00    Types: Cigarettes    Start date: 06/02/1965    Quit date: 06/09/1980    Years since quitting: 39.0  . Smokeless tobacco: Never Used  Substance Use Topics  . Alcohol use: No    Alcohol/week: 0.0 standard drinks  . Drug use: No     Allergies   Protonix [pantoprazole sodium] and Shellfish allergy   Review of Systems Review of Systems   Constitutional: Negative.   HENT: Negative.   Respiratory: Negative.   Cardiovascular: Negative.   Gastrointestinal: Negative.   Neurological: Negative.      Physical Exam Triage Vital Signs ED Triage Vitals  Enc Vitals Group     BP 06/13/19 1636 (!) 143/85     Pulse Rate 06/13/19 1636 90     Resp 06/13/19 1636 16     Temp 06/13/19 1636 98.7 F (37.1 C)     Temp Source 06/13/19 1636 Oral     SpO2  06/13/19 1636 97 %     Weight --      Height --      Head Circumference --      Peak Flow --      Pain Score 06/13/19 1643 0     Pain Loc --      Pain Edu? --      Excl. in [place]? --    No data found.  Updated Vital Signs BP (!) 143/85 (BP Location: Right Arm)   Pulse 90   Temp 98.7 F (37.1 C) (Oral)   Resp 16   SpO2 97%   Visual Acuity Right Eye Distance:   Left Eye Distance:   Bilateral Distance:    Right Eye Near:   Left Eye Near:    Bilateral Near:     Physical Exam Vitals and nursing note reviewed.  Constitutional:      General: She is not in acute distress.    Appearance: Normal appearance. She is normal weight. She is not ill-appearing or toxic-appearing.  HENT:     Head: Normocephalic.     Right Ear: Tympanic membrane, ear canal and external ear normal. There is no impacted cerumen.     Left Ear: Tympanic membrane, ear canal and external ear normal. There is no impacted cerumen.     Nose: Nose normal. No congestion.     Mouth/Throat:     Mouth: Mucous membranes are moist.     Pharynx: No oropharyngeal exudate or posterior oropharyngeal erythema.  Cardiovascular:     Rate and Rhythm: Normal rate and regular rhythm.     Pulses: Normal pulses.     Heart sounds: Normal heart sounds. No murmur.  Pulmonary:     Effort: Pulmonary effort is normal. No respiratory distress.     Breath sounds: Normal breath sounds. No wheezing or rhonchi.  Chest:     Chest wall: No tenderness.  Abdominal:     General: Abdomen is flat. Bowel sounds are normal. There is no  distension.     Palpations: There is no mass.  Skin:    Capillary Refill: Capillary refill takes less than 2 seconds.  Neurological:     Mental Status: She is alert and oriented to person, place, and time.      UC Treatments / Results  Labs (all labs ordered are listed, but only abnormal results are displayed) Labs Reviewed  NOVEL CORONAVIRUS, NAA    EKG   Radiology No results found.  Procedures Procedures (including critical care time)  Medications Ordered in UC Medications - No data to display  Initial Impression / Assessment and Plan / UC Course  I have reviewed the triage vital signs and the nursing notes.  Pertinent labs & imaging results that were available during my care of the patient were reviewed by me and considered in my medical decision making (see chart for details).   COVID-19 test was ordered.  Patient is stable for discharge in no acute distress.  Advised patient to quarantine until COVID-19 test result become available.  To go to ED for worsening of symptoms.  Patient verbalized understanding plan of care.  Final Clinical Impressions(s) / UC Diagnoses   Final diagnoses:  Exposure to COVID-19 virus     Discharge Instructions     COVID testing ordered.  It will take between 2-7 days for test results.  Someone will contact you regarding abnormal results.    In the meantime: You should remain isolated in your home for 10  days from symptom onset AND greater than 72 hours after symptoms resolution (absence of fever without the use of fever-reducing medication and improvement in respiratory symptoms), whichever is longer Get plenty of rest and push fluids Use medications daily for symptom relief Use OTC medications like ibuprofen or tylenol as needed fever or pain Call or go to the ED if you have any new or worsening symptoms such as fever, worsening cough, shortness of breath, chest tightness, chest pain, turning blue, changes in mental status, etc...      ED Prescriptions    None     PDMP not reviewed this encounter.   Emerson Monte, [place] 06/13/19 1707

## 2019-06-13 NOTE — Discharge Instructions (Signed)
COVID testing ordered.  It will take between 2-7 days for test results.  Someone will contact you regarding abnormal results.    In the meantime: You should remain isolated in your home for 10 days from symptom onset AND greater than 72 hours after symptoms resolution (absence of fever without the use of fever-reducing medication and improvement in respiratory symptoms), whichever is longer Get plenty of rest and push fluids Use medications daily for symptom relief Use OTC medications like ibuprofen or tylenol as needed fever or pain Call or go to the ED if you have any new or worsening symptoms such as fever, worsening cough, shortness of breath, chest tightness, chest pain, turning blue, changes in mental status, etc...  

## 2019-06-15 ENCOUNTER — Telehealth (HOSPITAL_COMMUNITY): Payer: Self-pay | Admitting: Emergency Medicine

## 2019-06-15 LAB — NOVEL CORONAVIRUS, NAA: SARS-CoV-2, NAA: DETECTED — AB

## 2019-06-15 NOTE — Telephone Encounter (Signed)

## 2019-06-17 ENCOUNTER — Other Ambulatory Visit: Payer: PPO

## 2019-06-17 ENCOUNTER — Encounter: Payer: Self-pay | Admitting: Gastroenterology

## 2019-06-17 ENCOUNTER — Ambulatory Visit: Payer: PPO | Admitting: Gastroenterology

## 2019-06-17 ENCOUNTER — Ambulatory Visit (INDEPENDENT_AMBULATORY_CARE_PROVIDER_SITE_OTHER): Payer: PPO | Admitting: Gastroenterology

## 2019-06-17 DIAGNOSIS — K5520 Angiodysplasia of colon without hemorrhage: Secondary | ICD-10-CM | POA: Diagnosis not present

## 2019-06-17 DIAGNOSIS — K5900 Constipation, unspecified: Secondary | ICD-10-CM

## 2019-06-17 DIAGNOSIS — D500 Iron deficiency anemia secondary to blood loss (chronic): Secondary | ICD-10-CM

## 2019-06-17 DIAGNOSIS — K5909 Other constipation: Secondary | ICD-10-CM

## 2019-06-17 NOTE — Progress Notes (Signed)
Primary Care Physician:  Lucianne Lei, MD  Primary GI: Dr. Oneida Alar   Patient Location: Home   Provider Location: [facility] office   Reason for Visit: Follow-up    Persons present on the virtual encounter, with roles: NP and patient   Total time (minutes) spent on medical discussion: 10  minutes   Due to COVID-19, visit was conducted using virtual method.  Visit was requested by patient.  Virtual Visit via Telephone Note Due to COVID-19, visit is conducted virtually and was requested by patient.   I connected with Nancy Liu on 06/17/19 at  8:30 AM EST by telephone and verified that I am speaking with the correct person using two identifiers.   I discussed the limitations, risks, security and privacy concerns of performing an evaluation and management service by telephone and the availability of in person appointments. I also discussed with the patient that there may be a patient responsible charge related to this service. The patient expressed understanding and agreed to proceed.  Chief Complaint  Patient presents with  . Anemia    dark stool taking Iron  . Constipation    taking Linzess prn     History of Present Illness: Nancy Liu is a 79 y.o. female presenting today with a history of IDAanddiarrhea.EGD and colonoscopy in October 2017 revealed many non-bleeding gastric ulcers, gastroduodenitis (no H. 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 Diverticulosis and non-bleeding hemorrhoids. Repeat EGD in January 2018 showederosivegastritis butulcers had healed. Chronic constipation. Continues on aspirin as benefits outweigh the risk. Per Dr. Oneida Alar, no indication for capsule study. Continue with supportive management. Presenting today for telephone visit.   Recently diagnosed with Covid. She was exposed to Covid from family member and was tested.   Taking Linzess just as needed, which is very seldom. 145 mcg will sometimes overshoot the mark and  sometimes be helpful. No dysphagia. Stool is dark on iron. Appetite is not great now with Covid. Otherwise eats well.     Past Medical History:  Diagnosis Date  . Arteriosclerotic cardiovascular disease (ASCVD) 2001   Bare-metal stent placed in the right coronary artery in 12/01; residual 50% lesion of the first diagonal and mid LAD  . Cerebrovascular disease   . Cyst, dermoid, arm, left 03/26/2018  . Diabetes mellitus    excellent control with a low-dose of a single oral agent  . GERD (gastroesophageal reflux disease)    with ulcers  . History of right coronary artery stent placement 2000  . Hyperlipidemia   . Hypertension   . Thalassemia minor   . Tobacco abuse, in remission    35 pack years; Quit in 1980     Past Surgical History:  Procedure Laterality Date  . COLONOSCOPY  2006   Dr. Collene Mares: normal  . COLONOSCOPY  2000   Dr. Everlean Cherry: normal   . COLONOSCOPY N/A 03/03/2016   Dr. Oneida Alar: diverticulosis, non-bleeding hemorrhoids, redundant left colon.  Marland Kitchen DILATION AND CURETTAGE OF UTERUS    . ESOPHAGOGASTRODUODENOSCOPY  2000   Dr. Everlean Cherry: gastritis   . ESOPHAGOGASTRODUODENOSCOPY  2006   Dr. Collene Mares: small hiatal hernia, gastritis, negative H.pylori   . ESOPHAGOGASTRODUODENOSCOPY N/A 03/03/2016   Procedure: ESOPHAGOGASTRODUODENOSCOPY (EGD);  Surgeon: Danie Binder, MD;  [facility];  Service: Endoscopy;  Laterality: N/A;  . ESOPHAGOGASTRODUODENOSCOPY N/A 06/03/2016   Dr. Oneida Alar: nonaggressive gastritis due to aspirin use. Previous ulcers had healed.  Marland Kitchen EXCISION OF KELOID Left 03/26/2018   Procedure: EXCISION OF  LEFT ARM MASS;  Surgeon: Fanny Skates, MD;  Location: [place];  Service: General;  Laterality: Left;  . INGUINAL HERNIA REPAIR  1970s   Right  . VAGINAL HYSTERECTOMY  1980   Unilateral oophorectomy     Current Meds  Medication Sig  . amLODipine (NORVASC) 10 MG tablet Take 1 tablet by mouth once daily  . aspirin 81 MG tablet Take 81 mg by  mouth daily.    Marland Kitchen augmented betamethasone dipropionate (DIPROLENE-AF) 0.05 % ointment Apply topically as needed.   . carvedilol (COREG) 6.25 MG tablet Take 1 tablet by mouth twice daily  . ergocalciferol (VITAMIN D2) 1.25 MG (50000 UT) capsule Take 1 capsule (50,000 Units total) by mouth once a week.  . Hypromellose (ISOPTO TEARS) 0.5 % SOLN Place 1 drop into both eyes 3 (three) times daily as needed (for dry eyes).  . irbesartan-hydrochlorothiazide (AVALIDE) 300-12.5 MG tablet Take 1 tablet by mouth daily.   . lansoprazole (PREVACID) 30 MG capsule Take 1 capsule (30 mg total) by mouth daily before breakfast.  . Latanoprostene Bunod (VYZULTA) 0.024 % SOLN Place 1 drop into both eyes daily.  Marland Kitchen linaclotide (LINZESS) 145 MCG CAPS capsule Take 1 capsule (145 mcg total) by mouth daily before breakfast. (Patient taking differently: Take 145 mcg by mouth as needed. )  . metFORMIN (GLUCOPHAGE) 500 MG tablet Take 500 mg by mouth daily with breakfast.  . Multiple Vitamin (MULTIVITAMIN) tablet Take 1 tablet by mouth as needed.   Marland Kitchen POLY-IRON 150 FORTE 150-25-1 MG-MCG-MG CAPS Take 1 capsule by mouth once daily  . rosuvastatin (CRESTOR) 20 MG tablet Take 1 tablet by mouth once daily     Family History  Problem Relation Age of Onset  . Diabetes Sister   . Heart disease Mother        also hypertension and asthma  . Cancer Mother   . Colon cancer Neg Hx   . Colon polyps Neg Hx     Social History   Socioeconomic History  . Marital status: Divorced    Spouse name: Not on file  . Number of children: 3  . Years of education: Not on file  . Highest education level: Not on file  Occupational History  . Occupation: Retired    Comment: [employer] work  Tobacco Use  . Smoking status: Former Smoker    Packs/day: 1.00    Years: 18.00    Pack years: 18.00    Types: Cigarettes    Start date: 06/02/1965    Quit date: 06/09/1980    Years since quitting: 39.0  . Smokeless tobacco: Never Used  Substance and  Sexual Activity  . Alcohol use: No    Alcohol/week: 0.0 standard drinks  . Drug use: No  . Sexual activity: Never  Other Topics Concern  . Not on file  Social History Narrative   Lives with daughter    Social Determinants of Health   Financial Resource Strain:   . Difficulty of Paying Living Expenses: Not on file  Food Insecurity:   . Worried About Charity fundraiser in the Last Year: Not on file  . Ran Out of Food in the Last Year: Not on file  Transportation Needs:   . Lack of Transportation (Medical): Not on file  . Lack of Transportation (Non-Medical): Not on file  Physical Activity:   . Days of Exercise per Week: Not on file  . Minutes of Exercise per Session: Not on file  Stress:   .  Feeling of Stress : Not on file  Social Connections:   . Frequency of Communication with Friends and Family: Not on file  . Frequency of Social Gatherings with Friends and Family: Not on file  . Attends Religious Services: Not on file  . Active Member of Clubs or Organizations: Not on file  . Attends Archivist Meetings: Not on file  . Marital Status: Not on file       Review of Systems: Gen: Denies fever, chills, anorexia. Denies fatigue, weakness, weight loss.  CV: Denies chest pain, palpitations, syncope, peripheral edema, and claudication. Resp: Denies dyspnea at rest, cough, wheezing, coughing up blood, and pleurisy. GI: see HPI Derm: Denies rash, itching, dry skin Psych: Denies depression, anxiety, memory loss, confusion. No homicidal or suicidal ideation.  Heme: Denies bruising, bleeding, and enlarged lymph nodes.  Observations/Objective: No distress. Unable to perform physical exam due to telephone encounter. No video available.   Assessment and Plan: Pleasant 79 year old female with chronic constipation, IDA with prior extensive work-up and related to small bowel AVMs, presenting today in telephone follow-up.   Constipation: managed with Linzess 145 mcg just as  needed. At times, overshooting the mark. Discussed decreasing dosage to half if needed by opening capsule and pouring in applesauce; if this is helpful, will write prescription for lower dosage.  IDA: chronic. Continue with Hematology. Continue with PPI indefinitely. Continue aspirin as benefits outweigh the risks.  Return in 6-8 months.   Follow Up Instructions:    I discussed the assessment and treatment plan with the patient. The patient was provided an opportunity to ask questions and all were answered. The patient agreed with the plan and demonstrated an understanding of the instructions.   The patient was advised to call back or seek an in-person evaluation if the symptoms worsen or if the condition fails to improve as anticipated.  I provided 10 minutes of non-face-to-face time during this encounter.  Annitta Needs, PhD, ANP-BC [facility]

## 2019-06-17 NOTE — Patient Instructions (Signed)
Continue follow-up with Hematology.  Continue Prevacid indefinitely.  For constipation: you can open the capsule and pour half into coffee, liquid, or applesauce. If this works well, let us know!  We will see you in 6-8 months!  I enjoyed seeing you again today! As you know, I value our relationship and want to provide genuine, compassionate, and quality care. I welcome your feedback. If you receive a survey regarding your visit,  I greatly appreciate you taking time to fill this out. See you next time!  Annitta Needs, PhD, ANP-BC [facility]

## 2019-06-20 ENCOUNTER — Inpatient Hospital Stay (HOSPITAL_BASED_OUTPATIENT_CLINIC_OR_DEPARTMENT_OTHER): Payer: PPO | Admitting: Nurse Practitioner

## 2019-06-20 DIAGNOSIS — D509 Iron deficiency anemia, unspecified: Secondary | ICD-10-CM | POA: Diagnosis not present

## 2019-06-20 NOTE — Progress Notes (Signed)
Nancy Liu, [place] [address]   CLINIC:  Medical Oncology/Hematology  PCP:  Lucianne Lei, MD [address] [place] [address] [phone]   REASON FOR VISIT: Follow-up for microcytic anemia  CURRENT THERAPY: Iron infusions and oral iron supplementation   INTERVAL HISTORY:  Nancy Liu 79 y.o. female was called for a routine follow-up for microcytic anemia.  She is doing well since her last visit.  She denies any bright red bleeding per rectum.  She does have dark stools that are related to her iron supplementation.  Denies any nausea, vomiting, or diarrhea. Denies any new pains. Had not noticed any recent bleeding such as epistaxis, hematuria or hematochezia. Denies recent chest pain on exertion, shortness of breath on minimal exertion, pre-syncopal episodes, or palpitations. Denies any numbness or tingling in hands or feet. Denies any recent fevers, infections, or recent hospitalizations. Patient reports appetite at 100% and energy level at 75%.  She is eating well maintain her weight at this time.   REVIEW OF SYSTEMS:  Review of Systems  All other systems reviewed and are negative.    PAST MEDICAL/SURGICAL HISTORY:  Past Medical History:  Diagnosis Date  . Arteriosclerotic cardiovascular disease (ASCVD) 2001   Bare-metal stent placed in the right coronary artery in 12/01; residual 50% lesion of the first diagonal and mid LAD  . Cerebrovascular disease   . Cyst, dermoid, arm, left 03/26/2018  . Diabetes mellitus    excellent control with a low-dose of a single oral agent  . GERD (gastroesophageal reflux disease)    with ulcers  . History of right coronary artery stent placement 2000  . Hyperlipidemia   . Hypertension   . Thalassemia minor   . Tobacco abuse, in remission    35 pack years; Quit in 1980   Past Surgical History:  Procedure Laterality Date  . COLONOSCOPY  2006   Dr. Collene Mares: normal  . COLONOSCOPY  2000   Dr.  Everlean Cherry: normal   . COLONOSCOPY N/A 03/03/2016   Dr. Oneida Alar: diverticulosis, non-bleeding hemorrhoids, redundant left colon.  Marland Kitchen DILATION AND CURETTAGE OF UTERUS    . ESOPHAGOGASTRODUODENOSCOPY  2000   Dr. Everlean Cherry: gastritis   . ESOPHAGOGASTRODUODENOSCOPY  2006   Dr. Collene Mares: small hiatal hernia, gastritis, negative H.pylori   . ESOPHAGOGASTRODUODENOSCOPY N/A 03/03/2016   Procedure: ESOPHAGOGASTRODUODENOSCOPY (EGD);  Surgeon: Danie Binder, MD;  [facility];  Service: Endoscopy;  Laterality: N/A;  . ESOPHAGOGASTRODUODENOSCOPY N/A 06/03/2016   Dr. Oneida Alar: nonaggressive gastritis due to aspirin use. Previous ulcers had healed.  Marland Kitchen EXCISION OF KELOID Left 03/26/2018   Procedure: EXCISION OF LEFT ARM MASS;  Surgeon: Fanny Skates, MD;  Location: [place];  Service: General;  Laterality: Left;  . INGUINAL HERNIA REPAIR  1970s   Right  . VAGINAL HYSTERECTOMY  1980   Unilateral oophorectomy     SOCIAL HISTORY:  Social History   Socioeconomic History  . Marital status: Divorced    Spouse name: Not on file  . Number of children: 3  . Years of education: Not on file  . Highest education level: Not on file  Occupational History  . Occupation: Retired    Comment: [employer] work  Tobacco Use  . Smoking status: Former Smoker    Packs/day: 1.00    Years: 18.00    Pack years: 18.00    Types: Cigarettes    Start date: 06/02/1965    Quit date: 06/09/1980  Years since quitting: 39.0  . Smokeless tobacco: Never Used  Substance and Sexual Activity  . Alcohol use: No    Alcohol/week: 0.0 standard drinks  . Drug use: No  . Sexual activity: Never  Other Topics Concern  . Not on file  Social History Narrative   Lives with daughter    Social Determinants of Health   Financial Resource Strain:   . Difficulty of Paying Living Expenses: Not on file  Food Insecurity:   . Worried About Charity fundraiser in the Last Year: Not on file  . Ran Out of Food in the Last Year:  Not on file  Transportation Needs:   . Lack of Transportation (Medical): Not on file  . Lack of Transportation (Non-Medical): Not on file  Physical Activity:   . Days of Exercise per Week: Not on file  . Minutes of Exercise per Session: Not on file  Stress:   . Feeling of Stress : Not on file  Social Connections:   . Frequency of Communication with Friends and Family: Not on file  . Frequency of Social Gatherings with Friends and Family: Not on file  . Attends Religious Services: Not on file  . Active Member of Clubs or Organizations: Not on file  . Attends Archivist Meetings: Not on file  . Marital Status: Not on file  Intimate Partner Violence:   . Fear of Current or Ex-Partner: Not on file  . Emotionally Abused: Not on file  . Physically Abused: Not on file  . Sexually Abused: Not on file    FAMILY HISTORY:  Family History  Problem Relation Age of Onset  . Diabetes Sister   . Heart disease Mother        also hypertension and asthma  . Cancer Mother   . Colon cancer Neg Hx   . Colon polyps Neg Hx     CURRENT MEDICATIONS:  Outpatient Encounter Medications as of 06/20/2019  Medication Sig  . amLODipine (NORVASC) 10 MG tablet Take 1 tablet by mouth once daily  . aspirin 81 MG tablet Take 81 mg by mouth daily.    Marland Kitchen augmented betamethasone dipropionate (DIPROLENE-AF) 0.05 % ointment Apply topically as needed.   . carvedilol (COREG) 6.25 MG tablet Take 1 tablet by mouth twice daily  . ergocalciferol (VITAMIN D2) 1.25 MG (50000 UT) capsule Take 1 capsule (50,000 Units total) by mouth once a week.  . Hypromellose (ISOPTO TEARS) 0.5 % SOLN Place 1 drop into both eyes 3 (three) times daily as needed (for dry eyes).  . irbesartan-hydrochlorothiazide (AVALIDE) 300-12.5 MG tablet Take 1 tablet by mouth daily.   . lansoprazole (PREVACID) 30 MG capsule Take 1 capsule (30 mg total) by mouth daily before breakfast.  . Latanoprostene Bunod (VYZULTA) 0.024 % SOLN Place 1 drop  into both eyes daily.  Marland Kitchen linaclotide (LINZESS) 145 MCG CAPS capsule Take 1 capsule (145 mcg total) by mouth daily before breakfast. (Patient taking differently: Take 145 mcg by mouth as needed. )  . metFORMIN (GLUCOPHAGE) 500 MG tablet Take 500 mg by mouth daily with breakfast.  . Multiple Vitamin (MULTIVITAMIN) tablet Take 1 tablet by mouth as needed.   Marland Kitchen POLY-IRON 150 FORTE 150-25-1 MG-MCG-MG CAPS Take 1 capsule by mouth once daily  . rosuvastatin (CRESTOR) 20 MG tablet Take 1 tablet by mouth once daily   No facility-administered encounter medications on file as of 06/20/2019.    ALLERGIES:  Allergies  Allergen Reactions  . Protonix [Pantoprazole Sodium]  DRY LIPS  . Shellfish Allergy     Unknown reaction     Vital signs: -Deferred due to telephone visit   Physical Exam -Deferred due to telephone visit -Patient was alert and oriented over the phone and in no acute distress.  LABORATORY DATA:  I have reviewed the labs as listed.  CBC    Component Value Date/Time   WBC 8.4 06/13/2019 0949   RBC 5.19 (H) 06/13/2019 0949   HGB 12.4 06/13/2019 0949   HCT 40.7 06/13/2019 0949   PLT 266 06/13/2019 0949   MCV 78.4 (L) 06/13/2019 0949   MCH 23.9 (L) 06/13/2019 0949   MCHC 30.5 06/13/2019 0949   RDW 16.2 (H) 06/13/2019 0949   LYMPHSABS 0.5 (L) 06/13/2019 0949   MONOABS 0.9 06/13/2019 0949   EOSABS 0.2 06/13/2019 0949   BASOSABS 0.0 06/13/2019 0949   CMP Latest Ref Rng & Units 06/13/2019 02/14/2019 10/18/2018  Glucose 70 - 99 mg/dL 104(H) 95 99  BUN 8 - 23 mg/dL 14 18 18   Creatinine 0.44 - 1.00 mg/dL 1.00 1.01(H) 1.01(H)  Sodium 135 - 145 mmol/L 139 140 143  Potassium 3.5 - 5.1 mmol/L 3.5 3.9 5.0  Chloride 98 - 111 mmol/L 99 103 105  CO2 22 - 32 mmol/L 28 27 29   Calcium 8.9 - 10.3 mg/dL 9.4 9.2 9.5  Total Protein 6.5 - 8.1 g/dL 7.2 6.8 7.0  Total Bilirubin 0.3 - 1.2 mg/dL 0.6 0.7 0.6  Alkaline Phos 38 - 126 U/L 62 63 64  AST 15 - 41 U/L 19 18 23   ALT 0 - 44 U/L 13 11  17      All questions were answered to patient's stated satisfaction. Encouraged patient to call with any new concerns or questions before his next visit to the cancer center and we can certain see him sooner, if needed.     ASSESSMENT & PLAN:   Microcytic anemia 1.  Microcytic anemia: - This is felt secondary to thalassemia minor.  Patient has no M spike. - She received Feraheme on 12/08/2016. - She denies any bright red bleeding per rectum or melena. -Patient states she is feeling well she has plenty of energy.  She reports she has been lazy over the past month but is going to start exercising again. - Patient is still taking MVI with iron daily. -Labs done on 06/13/2019 showed her hemoglobin 12.4, ferritin 28, percent saturation 11, platelets 266, WBC 8.4, LDH 168, creatinine 1.00. -She reports her stools are darker but they feel it is due to her daily iron intake.  She received a good checkup from her GI doctor at her last visit. -We will see her back in 6 months with repeat labs.  2.  Vitamin D deficiency: -Labs on 02/14/2019 showed her vitamin D level at 15.9. - I have prescribed vitamin D 50,000 units weekly. -Labs on 06/12/2018 showed vitamin D level has improved to 87.10 -She was told when she finishes her prescription she may start back the over-the-counter vitamin D daily. -We will recheck her labs at her next visit.      Orders placed this encounter:  Orders Placed This Encounter  Procedures  . Lactate dehydrogenase  . CBC with Differential/Platelet  . Comprehensive metabolic panel  . Ferritin  . Iron and TIBC  . Vitamin B12  . Vitamin D 25 hydroxy  . Folate    I provided 20 minutes of non face-to-face telephone visit time during this encounter, and > 50% was spent counseling  as documented under my assessment & plan.  Nancy Finders, FNP-C [place] [phone]

## 2019-06-20 NOTE — Progress Notes (Signed)
CC'ED TO PCP 

## 2019-06-20 NOTE — Patient Instructions (Signed)
[facility] at [facility] Discharge Instructions  Follow up in 6 months with labs    Thank you for choosing [facility] at [facility] to provide your oncology and hematology care.  To afford each patient quality time with our provider, please arrive at least 15 minutes before your scheduled appointment time.   If you have a lab appointment with the Cancer Center please come in thru the Main Entrance and check in at the main information desk.  You need to re-schedule your appointment should you arrive 10 or more minutes late.  We strive to give you quality time with our providers, and arriving late affects you and other patients whose appointments are after yours.  Also, if you no show three or more times for appointments you may be dismissed from the clinic at the providers discretion.     Again, thank you for choosing [facility].  Our hope is that these requests will decrease the amount of time that you wait before being seen by our physicians.       _____________________________________________________________  Should you have questions after your visit to [facility], please contact our office at [phone] between the hours of 8:00 a.m. and 4:30 p.m.  Voicemails left after 4:00 p.m. will not be returned until the following business day.  For prescription refill requests, have your pharmacy contact our office and allow 72 hours.    Due to Covid, you will need to wear a mask upon entering the hospital. If you do not have a mask, a mask will be given to you at the Main Entrance upon arrival. For doctor visits, patients may have 1 support person with them. For treatment visits, patients can not have anyone with them due to social distancing guidelines and our immunocompromised population.      

## 2019-06-20 NOTE — Assessment & Plan Note (Addendum)
1.  Microcytic anemia: - This is felt secondary to thalassemia minor.  Patient has no M spike. - She received Feraheme on 12/08/2016. - She denies any bright red bleeding per rectum or melena. -Patient states she is feeling well she has plenty of energy.  She reports she has been lazy over the past month but is going to start exercising again. - Patient is still taking MVI with iron daily. -Labs done on 06/13/2019 showed her hemoglobin 12.4, ferritin 28, percent saturation 11, platelets 266, WBC 8.4, LDH 168, creatinine 1.00. -She reports her stools are darker but they feel it is due to her daily iron intake.  She received a good checkup from her GI doctor at her last visit. -We will see her back in 6 months with repeat labs.  2.  Vitamin D deficiency: -Labs on 02/14/2019 showed her vitamin D level at 15.9. - I have prescribed vitamin D 50,000 units weekly. -Labs on 06/12/2018 showed vitamin D level has improved to 87.10 -She was told when she finishes her prescription she may start back the over-the-counter vitamin D daily. -We will recheck her labs at her next visit.

## 2019-06-27 ENCOUNTER — Telehealth: Payer: Self-pay | Admitting: Gastroenterology

## 2019-06-27 NOTE — Telephone Encounter (Signed)
Pt said she was diagnoses with Covid around Jan 10th or 11th.  She has not tried taking the 1/2 dose of Linzess in applesauce everyday, she will try this and let us know how she does. She has not been taking a probiotic and she will also try this.

## 2019-06-27 NOTE — Telephone Encounter (Signed)
LMOM to call.

## 2019-06-27 NOTE — Telephone Encounter (Signed)
[phone] please call patient, has a question about her medication, having abdominal pain.  Thinks her stomach is irritated from the pills.

## 2019-06-27 NOTE — Telephone Encounter (Signed)
I spoke to pt and she said she is just feeling bad and no energy.  She went outside at [employer] and got chilled, but that is the only time she chilled.  She is not eating a lot. But did have an egg, bacon and toast yesterday about lunch time. For the last week and a half she has been having 2-3 Ensures a day and drinking Gatorade and water. She does have frequent BM's, but when she does they are soft and BLACK. She is on iron daily. Said she feels bloated in the mid of her stomach but no pain in her stomach. She asked if she  could be sent to the hospital to have an xray of her stomach for the bloating. Vicente Males, please advise!

## 2019-06-27 NOTE — Telephone Encounter (Signed)
She told me she was recently diagnosed with COVID. Would not go to have outpatient xray unless enough time has passed per CDC recommendations. I had asked her to decrease linzess dosing, as she was only taking this as needed due to constipation. I feel she would be better on a daily regimen. She was on the 145 mcg dosing, but I asked her to open capsule and put half in applesauce. If this worked, we would send in prescription for 72 mcg. Her stool is chronically black on iron. History of IDA evaluated extensively in the past.When was her positive test? In meantime, start probiotic if not on currently.

## 2019-06-28 ENCOUNTER — Telehealth: Payer: Self-pay | Admitting: Gastroenterology

## 2019-06-28 MED ORDER — LINACLOTIDE 72 MCG PO CAPS: 72.0000 ug | ORAL_CAPSULE | Freq: Every day | ORAL | 3 refills | Status: DC

## 2019-06-28 NOTE — Telephone Encounter (Signed)
Pt said she has been using Linzess 145 samples and she needs a prescription called into [facility].

## 2019-06-28 NOTE — Telephone Encounter (Signed)
Linzess 72 mcg sent into the pharmacy.

## 2019-06-28 NOTE — Telephone Encounter (Signed)
Pt said the Linzess 145 capsules are a little too strong and she would like the 72 mcg prescription sent to the pharmacy at [facility] in [place].   Forwarding to Roseanne Kaufman, NP to send in.

## 2019-06-29 ENCOUNTER — Ambulatory Visit (INDEPENDENT_AMBULATORY_CARE_PROVIDER_SITE_OTHER): Payer: PPO | Admitting: Gastroenterology

## 2019-06-29 ENCOUNTER — Telehealth: Payer: Self-pay

## 2019-06-29 ENCOUNTER — Other Ambulatory Visit: Payer: Self-pay

## 2019-06-29 DIAGNOSIS — K5900 Constipation, unspecified: Secondary | ICD-10-CM

## 2019-06-29 NOTE — Patient Instructions (Signed)
   Going forward, let's stop linzess and take one capful of Clearlax with 8 ounces of water on any given day you do not have a good bowel movement.  Please keep [place] updated with how you are doing!  We will see you in July as already scheduled.  I enjoyed seeing you again today! As you know, I value our relationship and want to provide genuine, compassionate, and quality care. I welcome your feedback. If you receive a survey regarding your visit,  I greatly appreciate you taking time to fill this out. See you next time!  Annitta Needs, PhD, ANP-BC [facility]

## 2019-06-29 NOTE — Telephone Encounter (Signed)
Noted  

## 2019-06-29 NOTE — Telephone Encounter (Signed)
Doris: I took that info from the [employer] site. Let's just make sure it is consistent with [facility] policy. I do agree that if she is able and meets criteria, an office visit in person is appropriate.

## 2019-06-29 NOTE — Telephone Encounter (Signed)
Nancy Liu, I spoke to [name] and she recommended a virtual appointment since she had the Covid around Jan 11th. I have made pt aware and her daughter's phone number was given, she lives with her mom. [name] [phone]) [name] is aware the nurse will call before the appointment and then the provider will call back. She is aware a link will be sent ( She has not done this before).

## 2019-06-29 NOTE — Progress Notes (Signed)
Primary Care Physician:  Lucianne Lei, MD  Primary GI: Dr. Oneida Alar   Patient Location: Home   Provider Location: [facility] office   Reason for Visit: Constipation   Persons present on the virtual encounter, with roles: Patient, daughter, NP   Total time (minutes) spent on medical discussion: 15 minutes   Due to COVID-19, visit was conducted using virtual method.  Visit was requested by patient.  Virtual Visit via Telephone Note Due to COVID-19, visit is conducted virtually and was requested by patient.   I connected with Nancy Liu on 06/29/19 at  2:30 PM EST by telephone and verified that I am speaking with the correct person using two identifiers.   I discussed the limitations, risks, security and privacy concerns of performing an evaluation and management service by telephone and the availability of in person appointments. I also discussed with the patient that there may be a patient responsible charge related to this service. The patient expressed understanding and agreed to proceed.  Chief Complaint  Patient presents with  . Constipation     History of Present Illness: 79 y.o.femalepresenting today with a history of IDAanddiarrhea.EGD and colonoscopy in October 2017 revealed many non-bleeding gastric ulcers, gastroduodenitis (no H. 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 Diverticulosis and non-bleeding hemorrhoids. Repeat EGD in January 2018 showederosivegastritis butulcers had healed. Chronic constipation. Continues on aspirin as benefits outweigh the risk. Per Dr. Oneida Alar, no indication for capsule study. Continue with supportive management.   She just had virtual visit on 1/15. Called in with constipation and bloating, requesting additional follow-up. Virtual visit today due to COVID exposure and diagnosis a few weeks ago. Historically has been on Linzess 145 mcg but was only taking sporadically as it was overshooting the mark.  Divided  Linzess in 1/2 Mon through Wednesday. Stomach would start being gassy and tight. No bowel movements. Linzess 90$. Unable to afford this. Wants to try different option.   Last BM was Monday, small amount. No N/V. No abdominal pain. Feels bloated. Passing gas.   Has Clearlax.   Past Medical History:  Diagnosis Date  . Arteriosclerotic cardiovascular disease (ASCVD) 2001   Bare-metal stent placed in the right coronary artery in 12/01; residual 50% lesion of the first diagonal and mid LAD  . Cerebrovascular disease   . Cyst, dermoid, arm, left 03/26/2018  . Diabetes mellitus    excellent control with a low-dose of a single oral agent  . GERD (gastroesophageal reflux disease)    with ulcers  . History of right coronary artery stent placement 2000  . Hyperlipidemia   . Hypertension   . Thalassemia minor   . Tobacco abuse, in remission    35 pack years; Quit in 1980     Past Surgical History:  Procedure Laterality Date  . COLONOSCOPY  2006   Dr. Collene Mares: normal  . COLONOSCOPY  2000   Dr. Everlean Cherry: normal   . COLONOSCOPY N/A 03/03/2016   Dr. Oneida Alar: diverticulosis, non-bleeding hemorrhoids, redundant left colon.  Marland Kitchen DILATION AND CURETTAGE OF UTERUS    . ESOPHAGOGASTRODUODENOSCOPY  2000   Dr. Everlean Cherry: gastritis   . ESOPHAGOGASTRODUODENOSCOPY  2006   Dr. Collene Mares: small hiatal hernia, gastritis, negative H.pylori   . ESOPHAGOGASTRODUODENOSCOPY N/A 03/03/2016   Procedure: ESOPHAGOGASTRODUODENOSCOPY (EGD);  Surgeon: Danie Binder, MD;  [facility];  Service: Endoscopy;  Laterality: N/A;  . ESOPHAGOGASTRODUODENOSCOPY N/A 06/03/2016   Dr. Oneida Alar: nonaggressive gastritis due to aspirin use. Previous ulcers  had healed.  Marland Kitchen EXCISION OF KELOID Left 03/26/2018   Procedure: EXCISION OF LEFT ARM MASS;  Surgeon: Fanny Skates, MD;  Location: [facility];  Service: General;  Laterality: Left;  . INGUINAL HERNIA REPAIR  1970s   Right  . VAGINAL HYSTERECTOMY  1980   Unilateral  oophorectomy     Current Meds  Medication Sig  . amLODipine (NORVASC) 10 MG tablet Take 1 tablet by mouth once daily  . aspirin 81 MG tablet Take 81 mg by mouth daily.    Marland Kitchen augmented betamethasone dipropionate (DIPROLENE-AF) 0.05 % ointment Apply topically as needed.   . carvedilol (COREG) 6.25 MG tablet Take 1 tablet by mouth twice daily  . ergocalciferol (VITAMIN D2) 1.25 MG (50000 UT) capsule Take 1 capsule (50,000 Units total) by mouth once a week.  . Hypromellose (ISOPTO TEARS) 0.5 % SOLN Place 1 drop into both eyes 3 (three) times daily as needed (for dry eyes).  . irbesartan-hydrochlorothiazide (AVALIDE) 300-12.5 MG tablet Take 1 tablet by mouth daily.   . lansoprazole (PREVACID) 30 MG capsule Take 1 capsule (30 mg total) by mouth daily before breakfast.  . Latanoprostene Bunod (VYZULTA) 0.024 % SOLN Place 1 drop into both eyes daily.  . metFORMIN (GLUCOPHAGE) 500 MG tablet Take 500 mg by mouth daily with breakfast.  . Multiple Vitamin (MULTIVITAMIN) tablet Take 1 tablet by mouth as needed.   Marland Kitchen POLY-IRON 150 FORTE 150-25-1 MG-MCG-MG CAPS Take 1 capsule by mouth once daily  . rosuvastatin (CRESTOR) 20 MG tablet Take 1 tablet by mouth once daily     Family History  Problem Relation Age of Onset  . Diabetes Sister   . Heart disease Mother        also hypertension and asthma  . Cancer Mother   . Colon cancer Neg Hx   . Colon polyps Neg Hx     Social History   Socioeconomic History  . Marital status: Divorced    Spouse name: Not on file  . Number of children: 3  . Years of education: Not on file  . Highest education level: Not on file  Occupational History  . Occupation: Retired    Comment: [employer] work  Tobacco Use  . Smoking status: Former Smoker    Packs/day: 1.00    Years: 18.00    Pack years: 18.00    Types: Cigarettes    Start date: 06/02/1965    Quit date: 06/09/1980    Years since quitting: 39.0  . Smokeless tobacco: Never Used  Substance and Sexual Activity    . Alcohol use: No    Alcohol/week: 0.0 standard drinks  . Drug use: No  . Sexual activity: Never  Other Topics Concern  . Not on file  Social History Narrative   Lives with daughter    Social Determinants of Health   Financial Resource Strain:   . Difficulty of Paying Living Expenses: Not on file  Food Insecurity:   . Worried About Charity fundraiser in the Last Year: Not on file  . Ran Out of Food in the Last Year: Not on file  Transportation Needs:   . Lack of Transportation (Medical): Not on file  . Lack of Transportation (Non-Medical): Not on file  Physical Activity:   . Days of Exercise per Week: Not on file  . Minutes of Exercise per Session: Not on file  Stress:   . Feeling of Stress : Not on file  Social Connections:   . Frequency  of Communication with Friends and Family: Not on file  . Frequency of Social Gatherings with Friends and Family: Not on file  . Attends Religious Services: Not on file  . Active Member of Clubs or Organizations: Not on file  . Attends Archivist Meetings: Not on file  . Marital Status: Not on file       Review of Systems: Gen: Denies fever, chills, anorexia. Denies fatigue, weakness, weight loss.  CV: Denies chest pain, palpitations, syncope, peripheral edema, and claudication. Resp: Denies dyspnea at rest, cough, wheezing, coughing up blood, and pleurisy. GI: see HPI Derm: Denies rash, itching, dry skin Psych: Denies depression, anxiety, memory loss, confusion. No homicidal or suicidal ideation.  Heme: Denies bruising, bleeding, and enlarged lymph nodes.  Observations/Objective: No distress. Maintains eye contact with video. Alert and oriented.   Assessment and Plan: 79 year old female with constipation, reporting failure to Linzess as 145 mcg is too strong and lower dosage not effective; cost is an issue as well due to insurance coverage. No alarm signs/symptoms. Will have her do a modified Miralax prep, then start  Miralax any given day no BM. She is to call tomorrow with an update. Keep follow-up for July 2021.   Follow Up Instructions:    I discussed the assessment and treatment plan with the patient. The patient was provided an opportunity to ask questions and all were answered. The patient agreed with the plan and demonstrated an understanding of the instructions.   The patient was advised to call back or seek an in-person evaluation if the symptoms worsen or if the condition fails to improve as anticipated.  I provided 15 minutes of non-face-to-face time during this encounter.  Annitta Needs, PhD, ANP-BC [facility]

## 2019-06-29 NOTE — Telephone Encounter (Signed)
    Positive COVID 1/11. Patients can be seen if the following:  It has been 10 days since symptoms first appeared (has it been more than 10 days since she has had any symptoms?) and 24 hours without fever (not using tylenole, etc).   Recommend increasing Linzess to the 290 mcg. May take Miralax today and may need Miralax purge. Can have an office visit if above is met. If still having symptoms, offer doxy. I agree with in person visit only if meets those criteria above.

## 2019-06-29 NOTE — Telephone Encounter (Signed)
Pt is aware.  

## 2019-06-29 NOTE — Telephone Encounter (Signed)
PT called this AM complaining of bloating again. She asked if she could be seen in the office. Vicente Males, see previous note, pt recently had Covid)  She has not had a BM since Fri and has been bloated.  She took Linzess 13mcg ( 1/2 in applesauce) yesterday. She did start probiotic. She just feels she needs to come in for an office visit. Vicente Males, please advise!

## 2019-06-30 ENCOUNTER — Telehealth: Payer: Self-pay | Admitting: Gastroenterology

## 2019-06-30 NOTE — Telephone Encounter (Signed)
Good.

## 2019-06-30 NOTE — Telephone Encounter (Signed)
I called pt and she had been worried because she took Clearlax, hot cup of coffee and applesauce and had not had BM.  Since she left the message she has had a good BM and is aware of the instructions to take Clearlax on any day she does not have a good BM.

## 2019-06-30 NOTE — Telephone Encounter (Signed)
Pt called again this morning saying she isn't feeling any better and asked for the nurse to call her back. [phone]

## 2019-07-04 ENCOUNTER — Telehealth: Payer: Self-pay

## 2019-07-04 DIAGNOSIS — K5900 Constipation, unspecified: Secondary | ICD-10-CM

## 2019-07-04 NOTE — Telephone Encounter (Signed)
LMOM to call.

## 2019-07-04 NOTE — Telephone Encounter (Signed)
PT called and said she used a suppository yesterday and it did help her have a BM yesterday and today. It was not the most stool, but she said it was a good amount for her.  She is not having any pain. She has not been taking the Clearlax that Roseanne Kaufman, NP told her to take. I told her again that she should take that on any day that she does not have a BM and she said that she would. She does not eat very much and asked if Vicente Males could give her something to make her stomach feel better, she gets a little sick. She is eating applesauce in the morning some.  She was asking for an Xray of her stomach to see if she is backed up. Vicente Males, please advise!

## 2019-07-04 NOTE — Addendum Note (Signed)
Addended by: Annitta Needs on: 07/04/2019 12:56 PM   Modules accepted: Orders

## 2019-07-04 NOTE — Telephone Encounter (Signed)
Nancy Liu, let pt know AB entered order for x-ray. No appt needed.

## 2019-07-04 NOTE — Telephone Encounter (Signed)
Yes, can do an abdominal xray. I believe it's a walk in appt. I am placing order. FYI [facility] Clinical pool.   She needs to take the Clearlax daily for now.

## 2019-07-05 ENCOUNTER — Other Ambulatory Visit: Payer: Self-pay

## 2019-07-05 ENCOUNTER — Ambulatory Visit (HOSPITAL_COMMUNITY)
Admission: RE | Admit: 2019-07-05 | Discharge: 2019-07-05 | Disposition: A | Discharge status: Home / Self Care | Payer: PPO | Source: Ambulatory Visit | Attending: Gastroenterology | Admitting: Gastroenterology

## 2019-07-05 DIAGNOSIS — K5900 Constipation, unspecified: Secondary | ICD-10-CM | POA: Diagnosis not present

## 2019-07-05 NOTE — Telephone Encounter (Signed)
LMOM to call.

## 2019-07-05 NOTE — Telephone Encounter (Signed)
Pt is aware and will go get the xray this morning.

## 2019-07-06 ENCOUNTER — Other Ambulatory Visit: Payer: Self-pay | Admitting: Gastroenterology

## 2019-07-06 MED ORDER — LUBIPROSTONE 24 MCG PO CAPS: 24.0000 ug | ORAL_CAPSULE | Freq: Two times a day (BID) | ORAL | 3 refills | Status: DC

## 2019-07-08 ENCOUNTER — Telehealth: Payer: Self-pay | Admitting: Gastroenterology

## 2019-07-08 NOTE — Telephone Encounter (Signed)
Pt was calling to give an update with Amitiza 24 mcg. Medication is moving pts bowels. The second dose worked within 45 mins and pt feels her bowels completely emptied. Medication is working well for pt.

## 2019-07-08 NOTE — Telephone Encounter (Signed)
PATIENT CALLED AND SAID THE AMITEZA WORKED BUT SHE HAS A QUESTION [phone].  WANTS TO KNOW IF SHE SHOULD CONTINUE TAKING THEM.  SHE ALSO STATED HER STOOL CHANGED COLOR AFTER SEVERAL TIMES OF USING THE RESTROOM

## 2019-07-08 NOTE — Telephone Encounter (Signed)
Noted. Spoke with pt and she is aware.

## 2019-07-08 NOTE — Telephone Encounter (Signed)
Yes, she can continue twice a day with food. She can decrease to once daily if needed.

## 2019-07-11 ENCOUNTER — Telehealth: Payer: Self-pay | Admitting: Gastroenterology

## 2019-07-11 NOTE — Telephone Encounter (Signed)
Pt said she took the Amitiza 24 mcg and had really good results and watery diarrhea on Friday.   She said the first two BM's were BLACK, and then it lightened up to brown.  Said she is beginning to feel miserable again.   She did not take it bid because she had good results and then she can't have one again.  I told her she should take bid with food until she gets on a good schedule and she said she will try to do so.  Sending FYI to Roseanne Kaufman, NP and to advise!

## 2019-07-11 NOTE — Telephone Encounter (Signed)
PATIENT CALLED AND SAID THAT HER BOWLS FINALLY MOVED AFTER THE MEDICATION  BUT SHE   TOOK IT TODAY AND SHE HAS NOT GONE  [phone]

## 2019-07-11 NOTE — Telephone Encounter (Signed)
LMOM to call.

## 2019-07-11 NOTE — Telephone Encounter (Addendum)
Yes, take it BID. IF she continues to have looser stool, please provide samples of 8 mcg Amitiza.   Monitor for further black stool. History of AVMs.

## 2019-07-12 NOTE — Telephone Encounter (Signed)
Pt is aware and said the Linzess was definitely too strong. Pt's daughter, [name] spoke to me with concerns about her mom and requested an OV. Per Roseanne Kaufman, NP pt is scheduled OV on 07/13/2019 at 1:30 pm.  [name] says the pt is taking the Amitiza 24 mcg bid with food.

## 2019-07-12 NOTE — Telephone Encounter (Signed)
Pt said she has been taking the Amitiza bid and still has not had a BM for the last 2 days, but did have a little gas. She will monitor for black stools.  Vicente Males, please advise!

## 2019-07-12 NOTE — Telephone Encounter (Signed)
Add Miralax BID for now. May need to take just daily. I believe Linzess was too strong for patient? Need to just verify that.

## 2019-07-13 ENCOUNTER — Encounter: Payer: Self-pay | Admitting: Gastroenterology

## 2019-07-13 ENCOUNTER — Other Ambulatory Visit: Payer: Self-pay

## 2019-07-13 ENCOUNTER — Other Ambulatory Visit: Payer: Self-pay | Admitting: Family Medicine

## 2019-07-13 ENCOUNTER — Ambulatory Visit: Payer: PPO | Admitting: Gastroenterology

## 2019-07-13 VITALS — BP 138/85 | HR 95 | Temp 96.9°F | Ht 62.5 in | Wt 152.2 lb

## 2019-07-13 DIAGNOSIS — K5900 Constipation, unspecified: Secondary | ICD-10-CM

## 2019-07-13 DIAGNOSIS — R634 Abnormal weight loss: Secondary | ICD-10-CM

## 2019-07-13 DIAGNOSIS — D500 Iron deficiency anemia secondary to blood loss (chronic): Secondary | ICD-10-CM

## 2019-07-13 MED ORDER — PREDNISONE 50 MG PO TABS: ORAL_TABLET | ORAL | 0 refills | Status: DC

## 2019-07-13 NOTE — Patient Instructions (Addendum)
We have arranged a CT scan. To prevent any possible reaction to the dye, please do the following:  1. Take Prednisone 1 tablet (50 milligrams) 13 hours before the scan, 7 hours before the scan, and final dose 1 hour before the scan.  2. Also take Benadryl 50 milligrams 1 hour before the scan.  3. Do not take metformin the day of the scan, and don't take for 2 days following the scan. On the third day after the CT scan, you can resume metformin.   Start taking Prevacid every day.   Continue Amitiza twice a day with food. If it becomes too strong, you can take it just once per day.  Please complete blood work.  Further recommendations after blood work and review of CT scan!  I enjoyed seeing you again today! As you know, I value our relationship and want to provide genuine, compassionate, and quality care. I welcome your feedback. If you receive a survey regarding your visit,  I greatly appreciate you taking time to fill this out. See you next time!  Annitta Needs, PhD, ANP-BC [facility]

## 2019-07-13 NOTE — Progress Notes (Signed)
Referring Provider: Lucianne Lei, MD Primary Care Physician:  Lucianne Lei, MD Primary GI: Dr. Oneida Alar   Chief Complaint  Patient presents with  . Bloated    cold chill came over her this morning,poor appetite,wt loss 10 lbs, black stools    HPI:   Nancy Liu is a 79 y.o. female presenting today with a history of IDAanddiarrhea.EGD and colonoscopy in October 2017 revealed many non-bleeding gastric ulcers, gastroduodenitis (no H. 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 Diverticulosis and non-bleeding hemorrhoids. Repeat EGD in January 2018 showederosivegastritis butulcers had healed.Chronic constipation. Continues on aspirin as benefits outweigh the risk. Per Dr. Oneida Alar, no indication for capsule study. Continue with supportive management. She has had issues with constipation and bloating dating back to a month ago. Requesting office visit today.  Linzess 145 mcg too strong and Linzess 72 mcg not effective. Started on Amitiza 24 mcg. Abdominal xray with moderately large stool urden. No acute findings.   Since Sept 2020, she has lost 12 lbs. Appears her baseline weight is in the low 160s for past few years. In the 170s 2018. Amitiza has helped with constipation. Helped with bloating after having BM. Black stool yesterday. This morning had a cold chill. Within past year she has decreased appetite, feels worsening. Notes early satiety. States she is getting concerned. Family is concerned as well with unintentional weight loss.   Past Medical History:  Diagnosis Date  . Arteriosclerotic cardiovascular disease (ASCVD) 2001   Bare-metal stent placed in the right coronary artery in 12/01; residual 50% lesion of the first diagonal and mid LAD  . Cerebrovascular disease   . Cyst, dermoid, arm, left 03/26/2018  . Diabetes mellitus    excellent control with a low-dose of a single oral agent  . GERD (gastroesophageal reflux disease)    with ulcers  . History of  right coronary artery stent placement 2000  . Hyperlipidemia   . Hypertension   . Thalassemia minor   . Tobacco abuse, in remission    35 pack years; Quit in 1980    Past Surgical History:  Procedure Laterality Date  . COLONOSCOPY  2006   Dr. Collene Mares: normal  . COLONOSCOPY  2000   Dr. Everlean Cherry: normal   . COLONOSCOPY N/A 03/03/2016   Dr. Oneida Alar: diverticulosis, non-bleeding hemorrhoids, redundant left colon.  Marland Kitchen DILATION AND CURETTAGE OF UTERUS    . ESOPHAGOGASTRODUODENOSCOPY  2000   Dr. Everlean Cherry: gastritis   . ESOPHAGOGASTRODUODENOSCOPY  2006   Dr. Collene Mares: small hiatal hernia, gastritis, negative H.pylori   . ESOPHAGOGASTRODUODENOSCOPY N/A 03/03/2016   Procedure: ESOPHAGOGASTRODUODENOSCOPY (EGD);  Surgeon: Danie Binder, MD;  [facility];  Service: Endoscopy;  Laterality: N/A;  . ESOPHAGOGASTRODUODENOSCOPY N/A 06/03/2016   Dr. Oneida Alar: nonaggressive gastritis due to aspirin use. Previous ulcers had healed.  Marland Kitchen EXCISION OF KELOID Left 03/26/2018   Procedure: EXCISION OF LEFT ARM MASS;  Surgeon: Fanny Skates, MD;  Location: [facility];  Service: General;  Laterality: Left;  . INGUINAL HERNIA REPAIR  1970s   Right  . VAGINAL HYSTERECTOMY  1980   Unilateral oophorectomy    Current Outpatient Medications  Medication Sig Dispense Refill  . amLODipine (NORVASC) 10 MG tablet Take 1 tablet by mouth once daily 90 tablet 3  . aspirin 81 MG tablet Take 81 mg by mouth daily.      Marland Kitchen augmented betamethasone dipropionate (DIPROLENE-AF) 0.05 % ointment Apply topically as needed.     . carvedilol (COREG) 6.25  MG tablet Take 1 tablet by mouth twice daily 180 tablet 0  . ergocalciferol (VITAMIN D2) 1.25 MG (50000 UT) capsule Take 1 capsule (50,000 Units total) by mouth once a week. 16 capsule 3  . Hypromellose (ISOPTO TEARS) 0.5 % SOLN Place 1 drop into both eyes 3 (three) times daily as needed (for dry eyes).    . irbesartan-hydrochlorothiazide (AVALIDE) 300-12.5 MG tablet Take 1  tablet by mouth daily.     . lansoprazole (PREVACID) 30 MG capsule Take 1 capsule (30 mg total) by mouth daily before breakfast. (Patient taking differently: Take 30 mg by mouth as needed. ) 90 capsule 3  . Latanoprostene Bunod (VYZULTA) 0.024 % SOLN Place 1 drop into both eyes daily.    Marland Kitchen lubiprostone (AMITIZA) 24 MCG capsule Take 1 capsule (24 mcg total) by mouth 2 (two) times daily with a meal. 60 capsule 3  . metFORMIN (GLUCOPHAGE) 500 MG tablet Take 500 mg by mouth daily with breakfast.    . Multiple Vitamin (MULTIVITAMIN) tablet Take 1 tablet by mouth as needed.     Marland Kitchen POLY-IRON 150 FORTE 150-25-1 MG-MCG-MG CAPS Take 1 capsule by mouth once daily 90 capsule 6  . rosuvastatin (CRESTOR) 20 MG tablet Take 1 tablet by mouth once daily 90 tablet 0   No current facility-administered medications for this visit.    Allergies as of 07/13/2019 - Review Complete 07/13/2019  Allergen Reaction Noted  . Protonix [pantoprazole sodium]  03/27/2016  . Shellfish allergy  02/18/2011    Family History  Problem Relation Age of Onset  . Diabetes Sister   . Heart disease Mother        also hypertension and asthma  . Cancer Mother   . Colon cancer Neg Hx   . Colon polyps Neg Hx     Social History   Socioeconomic History  . Marital status: Divorced    Spouse name: Not on file  . Number of children: 3  . Years of education: Not on file  . Highest education level: Not on file  Occupational History  . Occupation: Retired    Comment: [employer] work  Tobacco Use  . Smoking status: Former Smoker    Packs/day: 1.00    Years: 18.00    Pack years: 18.00    Types: Cigarettes    Start date: 06/02/1965    Quit date: 06/09/1980    Years since quitting: 39.1  . Smokeless tobacco: Never Used  Substance and Sexual Activity  . Alcohol use: No    Alcohol/week: 0.0 standard drinks  . Drug use: No  . Sexual activity: Never  Other Topics Concern  . Not on file  Social History Narrative   Lives with  daughter    Social Determinants of Health   Financial Resource Strain:   . Difficulty of Paying Living Expenses: Not on file  Food Insecurity:   . Worried About Charity fundraiser in the Last Year: Not on file  . Ran Out of Food in the Last Year: Not on file  Transportation Needs:   . Lack of Transportation (Medical): Not on file  . Lack of Transportation (Non-Medical): Not on file  Physical Activity:   . Days of Exercise per Week: Not on file  . Minutes of Exercise per Session: Not on file  Stress:   . Feeling of Stress : Not on file  Social Connections:   . Frequency of Communication with Friends and Family: Not on file  . Frequency of Social  Gatherings with Friends and Family: Not on file  . Attends Religious Services: Not on file  . Active Member of Clubs or Organizations: Not on file  . Attends Archivist Meetings: Not on file  . Marital Status: Not on file    Review of Systems: Gen: see HPI CV: Denies chest pain, palpitations, syncope, peripheral edema, and claudication. Resp: Denies dyspnea at rest, cough, wheezing, coughing up blood, and pleurisy. GI: see HPI  Derm: Denies rash, itching, dry skin Psych: Denies depression, anxiety, memory loss, confusion. No homicidal or suicidal ideation.  Heme: see HPI  Physical Exam: BP 138/85   Pulse 95   Temp (!) 96.9 F (36.1 C) (Temporal)   Ht 5' 2.5" (1.588 m)   Wt 152 lb 3.2 oz (69 kg)   BMI 27.39 kg/m  General:   Alert and oriented. No distress noted. Pleasant and cooperative.  Head:  Normocephalic and atraumatic. Eyes:  Conjuctiva clear without scleral icterus. Mouth:  Oral mucosa pink and moist. Good dentition. No lesions. Abdomen:  +BS, soft, non-tender and non-distended. No rebound or guarding. No HSM or masses noted. Msk:  Symmetrical without gross deformities. Normal posture. Extremities:  Without edema. Neurologic:  Alert and  oriented x4 Psych:  Alert and cooperative. Normal mood and  affect.  ASSESSMENT: Nancy Liu is a 79 y.o. female presenting today with history of IDA due to chronic GI blood loss (AVMs), worsening constipation that has now responded to Amitiza 24 mcg BID, and concerns for bloating and weight loss.   Weight loss: documented in epic with slow trend down from 2018 but more rapid since Sept 2020, losing 12 lbs since that time. Lack of appetite is concerning. No imaging on file. EGD and colonoscopy 2017/2018. Updating labs and pursuing CT abd/pelvis. May need repeat EGD if persistent early satiety.  Constipation: now doing well with Amitiza 24 mcg BID. Linzess 145 mcg too strong and Linzess 72 mcg ineffective. Continue Amitiza.   IDA: continue with supportive management. CBC ordered today due to reported black stool. Remains on aspirin as benefits outweigh the risks. Has not been taking Prevacid daily; discussed importance of remaining on PPI indefinitely.    PLAN:   CT abd/pelvis with contrast. Pre-medication (prednisone and benadryl) ordered due to history of shellfish allergy (history of rash but has eaten shrimp since then without issues)  Prevacid daily  Check CBC and TSH now  Continue Amitiza 24 mcg BID  Further recommendations to follow   Annitta Needs, PhD, ANP-BC [facility]

## 2019-07-14 ENCOUNTER — Other Ambulatory Visit (HOSPITAL_COMMUNITY)
Admission: RE | Admit: 2019-07-14 | Discharge: 2019-07-14 | Disposition: A | Discharge status: Home / Self Care | Payer: PPO | Source: Ambulatory Visit | Attending: Gastroenterology | Admitting: Gastroenterology

## 2019-07-14 ENCOUNTER — Ambulatory Visit (HOSPITAL_COMMUNITY)
Admission: RE | Admit: 2019-07-14 | Discharge: 2019-07-14 | Disposition: A | Discharge status: Home / Self Care | Payer: PPO | Source: Ambulatory Visit | Attending: Gastroenterology | Admitting: Gastroenterology

## 2019-07-14 DIAGNOSIS — K862 Cyst of pancreas: Secondary | ICD-10-CM | POA: Diagnosis not present

## 2019-07-14 DIAGNOSIS — R634 Abnormal weight loss: Secondary | ICD-10-CM | POA: Diagnosis not present

## 2019-07-14 LAB — CBC WITH DIFFERENTIAL/PLATELET
Abs Immature Granulocytes: 0.06 10*3/uL (ref 0.00–0.07)
Basophils Absolute: 0 10*3/uL (ref 0.0–0.1)
Basophils Relative: 0 %
Eosinophils Absolute: 0 10*3/uL (ref 0.0–0.5)
Eosinophils Relative: 0 %
HCT: 40.2 % (ref 36.0–46.0)
Hemoglobin: 12.8 g/dL (ref 12.0–15.0)
Immature Granulocytes: 1 %
Lymphocytes Relative: 8 %
Lymphs Abs: 0.7 10*3/uL (ref 0.7–4.0)
MCH: 24.5 pg — ABNORMAL LOW (ref 26.0–34.0)
MCHC: 31.8 g/dL (ref 30.0–36.0)
MCV: 77 fL — ABNORMAL LOW (ref 80.0–100.0)
Monocytes Absolute: 0.1 10*3/uL (ref 0.1–1.0)
Monocytes Relative: 1 %
Neutro Abs: 8.4 10*3/uL — ABNORMAL HIGH (ref 1.7–7.7)
Neutrophils Relative %: 90 %
Platelets: 293 10*3/uL (ref 150–400)
RBC: 5.22 MIL/uL — ABNORMAL HIGH (ref 3.87–5.11)
RDW: 15.8 % — ABNORMAL HIGH (ref 11.5–15.5)
WBC: 9.3 10*3/uL (ref 4.0–10.5)
nRBC: 0 % (ref 0.0–0.2)

## 2019-07-14 LAB — TSH: TSH: 0.41 u[IU]/mL (ref 0.350–4.500)

## 2019-07-14 MED ORDER — IOHEXOL 300 MG/ML  SOLN
100.0000 mL | Freq: Once | INTRAMUSCULAR | Status: AC | PRN
  Administered 2019-07-14: 09:00:00 100 mL via INTRAVENOUS

## 2019-07-14 NOTE — Progress Notes (Signed)
Cc'ed to pcp °

## 2019-07-18 ENCOUNTER — Other Ambulatory Visit: Payer: Self-pay

## 2019-07-18 DIAGNOSIS — K862 Cyst of pancreas: Secondary | ICD-10-CM

## 2019-07-18 NOTE — Progress Notes (Unsigned)
mr

## 2019-07-19 ENCOUNTER — Ambulatory Visit (HOSPITAL_COMMUNITY)
Admission: RE | Admit: 2019-07-19 | Discharge: 2019-07-19 | Disposition: A | Discharge status: Home / Self Care | Payer: PPO | Source: Ambulatory Visit | Attending: Gastroenterology | Admitting: Gastroenterology

## 2019-07-19 ENCOUNTER — Other Ambulatory Visit: Payer: Self-pay | Admitting: Gastroenterology

## 2019-07-19 ENCOUNTER — Other Ambulatory Visit: Payer: Self-pay

## 2019-07-19 DIAGNOSIS — K862 Cyst of pancreas: Secondary | ICD-10-CM

## 2019-07-19 DIAGNOSIS — R140 Abdominal distension (gaseous): Secondary | ICD-10-CM | POA: Diagnosis not present

## 2019-07-19 DIAGNOSIS — R634 Abnormal weight loss: Secondary | ICD-10-CM | POA: Diagnosis not present

## 2019-07-19 MED ORDER — GADOBUTROL 1 MMOL/ML IV SOLN
6.0000 mL | Freq: Once | INTRAVENOUS | Status: AC | PRN
  Administered 2019-07-19: 15:00:00 6 mL via INTRAVENOUS

## 2019-07-25 DIAGNOSIS — E119 Type 2 diabetes mellitus without complications: Secondary | ICD-10-CM | POA: Diagnosis not present

## 2019-07-25 DIAGNOSIS — H409 Unspecified glaucoma: Secondary | ICD-10-CM | POA: Diagnosis not present

## 2019-07-25 DIAGNOSIS — F064 Anxiety disorder due to known physiological condition: Secondary | ICD-10-CM | POA: Diagnosis not present

## 2019-07-25 DIAGNOSIS — R634 Abnormal weight loss: Secondary | ICD-10-CM | POA: Diagnosis not present

## 2019-08-02 ENCOUNTER — Ambulatory Visit: Payer: PPO | Attending: Internal Medicine

## 2019-08-02 DIAGNOSIS — Z23 Encounter for immunization: Secondary | ICD-10-CM

## 2019-08-09 DIAGNOSIS — R634 Abnormal weight loss: Secondary | ICD-10-CM | POA: Diagnosis not present

## 2019-08-09 DIAGNOSIS — K219 Gastro-esophageal reflux disease without esophagitis: Secondary | ICD-10-CM | POA: Diagnosis not present

## 2019-08-09 DIAGNOSIS — E782 Mixed hyperlipidemia: Secondary | ICD-10-CM | POA: Diagnosis not present

## 2019-08-09 DIAGNOSIS — I110 Hypertensive heart disease with heart failure: Secondary | ICD-10-CM | POA: Diagnosis not present

## 2019-08-09 DIAGNOSIS — E1169 Type 2 diabetes mellitus with other specified complication: Secondary | ICD-10-CM | POA: Diagnosis not present

## 2019-08-09 DIAGNOSIS — I119 Hypertensive heart disease without heart failure: Secondary | ICD-10-CM | POA: Diagnosis not present

## 2019-08-18 ENCOUNTER — Ambulatory Visit: Payer: PPO | Admitting: Cardiology

## 2019-08-25 ENCOUNTER — Ambulatory Visit: Payer: PPO

## 2019-08-25 ENCOUNTER — Ambulatory Visit: Payer: PPO | Attending: Internal Medicine

## 2019-08-25 DIAGNOSIS — Z23 Encounter for immunization: Secondary | ICD-10-CM

## 2019-08-25 NOTE — Progress Notes (Signed)
   Covid-19 Vaccination Clinic  Name:  Nancy Liu    MRN: [id number] DOB: 1941/04/16  08/25/2019  [name] was observed post Covid-19 immunization for 15 minutes without incident. She was provided with Vaccine Information Sheet and instruction to access the V-Safe system.   [name] was instructed to call 911 with any severe reactions post vaccine: Marland Kitchen Difficulty breathing  . Swelling of face and throat  . A fast heartbeat  . A bad rash all over body  . Dizziness and weakness   Immunizations Administered    Name Date Dose VIS Date Route   Moderna COVID-19 Vaccine 08/25/2019 12:31 PM 0.5 mL 05/03/2019 Intramuscular   Manufacturer: Moderna   Lot: [id number]   [place][id number]

## 2019-08-30 ENCOUNTER — Ambulatory Visit: Payer: PPO

## 2019-09-06 NOTE — Progress Notes (Signed)
REVIEWED-NO ADDITIONAL RECOMMENDATIONS. 

## 2019-09-16 ENCOUNTER — Other Ambulatory Visit: Payer: Self-pay | Admitting: Cardiology

## 2019-09-20 ENCOUNTER — Ambulatory Visit: Payer: PPO | Admitting: Cardiology

## 2019-09-20 ENCOUNTER — Other Ambulatory Visit: Payer: Self-pay

## 2019-09-20 ENCOUNTER — Encounter: Payer: Self-pay | Admitting: Cardiology

## 2019-09-20 VITALS — BP 130/74 | HR 83 | Ht 62.5 in | Wt 160.0 lb

## 2019-09-20 DIAGNOSIS — R942 Abnormal results of pulmonary function studies: Secondary | ICD-10-CM

## 2019-09-20 DIAGNOSIS — I10 Essential (primary) hypertension: Secondary | ICD-10-CM

## 2019-09-20 DIAGNOSIS — H2513 Age-related nuclear cataract, bilateral: Secondary | ICD-10-CM | POA: Diagnosis not present

## 2019-09-20 DIAGNOSIS — H401131 Primary open-angle glaucoma, bilateral, mild stage: Secondary | ICD-10-CM | POA: Diagnosis not present

## 2019-09-20 NOTE — Progress Notes (Signed)
Clinical Summary Ms. Bomberger is a 79 y.o.female seen today for follow up of the following medical problems.   1. CAD  - prior BMS to RCA in 2001 at [facility]  - 02/2011 MPI no ischemia  - echo 04/2013 showed LVEF 60-65% with grade I diastolic dysfunction.   - no recent chest pain. No SOB/DOE.  - compliant withmeds   2. HTN  - compliant with meds  3. Hyperlpidiemia  - labs followed by pcp   4. SOB/Abnormal PFTs - former smoker x 30-35 years. - PFTs Jan 2017 with severely redcued DLCO, referred to Dr Luan Pulling but only saw once.   - looking to estbalish with new pulmonary since Dr Luan Pulling has retired  5. Anemia -followed by hematology     SH: has 3 children. 2 grandchilren, one is a Marine scientist working at childrens hospital in [place].Doristine Devoid grandbaby is on the way in November.    Past Medical History:  Diagnosis Date  . Arteriosclerotic cardiovascular disease (ASCVD) 2001   Bare-metal stent placed in the right coronary artery in 12/01; residual 50% lesion of the first diagonal and mid LAD  . Cerebrovascular disease   . Cyst, dermoid, arm, left 03/26/2018  . Diabetes mellitus    excellent control with a low-dose of a single oral agent  . GERD (gastroesophageal reflux disease)    with ulcers  . History of right coronary artery stent placement 2000  . Hyperlipidemia   . Hypertension   . Thalassemia minor   . Tobacco abuse, in remission    35 pack years; Quit in 1980     Allergies  Allergen Reactions  . Protonix [Pantoprazole Sodium]     DRY LIPS  . Shellfish Allergy     Unknown reaction     Current Outpatient Medications  Medication Sig Dispense Refill  . amLODipine (NORVASC) 10 MG tablet Take 1 tablet by mouth once daily 90 tablet 3  . aspirin 81 MG tablet Take 81 mg by mouth daily.      Marland Kitchen augmented betamethasone dipropionate (DIPROLENE-AF) 0.05 % ointment Apply topically as needed.     . carvedilol (COREG) 6.25 MG tablet Take 1  tablet by mouth twice daily 180 tablet 0  . ergocalciferol (VITAMIN D2) 1.25 MG (50000 UT) capsule Take 1 capsule (50,000 Units total) by mouth once a week. 16 capsule 3  . Hypromellose (ISOPTO TEARS) 0.5 % SOLN Place 1 drop into both eyes 3 (three) times daily as needed (for dry eyes).    . irbesartan-hydrochlorothiazide (AVALIDE) 300-12.5 MG tablet Take 1 tablet by mouth daily.     . lansoprazole (PREVACID) 30 MG capsule Take 1 capsule (30 mg total) by mouth daily before breakfast. (Patient taking differently: Take 30 mg by mouth as needed. ) 90 capsule 3  . Latanoprostene Bunod (VYZULTA) 0.024 % SOLN Place 1 drop into both eyes daily.    Marland Kitchen lubiprostone (AMITIZA) 24 MCG capsule Take 1 capsule (24 mcg total) by mouth 2 (two) times daily with a meal. 60 capsule 3  . metFORMIN (GLUCOPHAGE) 500 MG tablet Take 500 mg by mouth daily with breakfast.    . Multiple Vitamin (MULTIVITAMIN) tablet Take 1 tablet by mouth as needed.     Marland Kitchen POLY-IRON 150 FORTE 150-25-1 MG-MCG-MG CAPS Take 1 capsule by mouth once daily 90 capsule 6  . predniSONE (DELTASONE) 50 MG tablet Take 1 tablet 13 hours before scan, 7 hours before scan, and 1 hour before scan. 3 tablet 0  .  rosuvastatin (CRESTOR) 20 MG tablet Take 1 tablet by mouth once daily 90 tablet 0   No current facility-administered medications for this visit.     Past Surgical History:  Procedure Laterality Date  . COLONOSCOPY  2006   Dr. Collene Mares: normal  . COLONOSCOPY  2000   Dr. Everlean Cherry: normal   . COLONOSCOPY N/A 03/03/2016   Dr. Oneida Alar: diverticulosis, non-bleeding hemorrhoids, redundant left colon.  Marland Kitchen DILATION AND CURETTAGE OF UTERUS    . ESOPHAGOGASTRODUODENOSCOPY  2000   Dr. Everlean Cherry: gastritis   . ESOPHAGOGASTRODUODENOSCOPY  2006   Dr. Collene Mares: small hiatal hernia, gastritis, negative H.pylori   . ESOPHAGOGASTRODUODENOSCOPY N/A 03/03/2016   Procedure: ESOPHAGOGASTRODUODENOSCOPY (EGD);  Surgeon: Danie Binder, MD;  [facility];  Service:  Endoscopy;  Laterality: N/A;  . ESOPHAGOGASTRODUODENOSCOPY N/A 06/03/2016   Dr. Oneida Alar: nonaggressive gastritis due to aspirin use. Previous ulcers had healed.  Marland Kitchen EXCISION OF KELOID Left 03/26/2018   Procedure: EXCISION OF LEFT ARM MASS;  Surgeon: Fanny Skates, MD;  Location: [facility];  Service: General;  Laterality: Left;  . INGUINAL HERNIA REPAIR  1970s   Right  . VAGINAL HYSTERECTOMY  1980   Unilateral oophorectomy     Allergies  Allergen Reactions  . Protonix [Pantoprazole Sodium]     DRY LIPS  . Shellfish Allergy     Unknown reaction      Family History  Problem Relation Age of Onset  . Diabetes Sister   . Heart disease Mother        also hypertension and asthma  . Cancer Mother   . Colon cancer Neg Hx   . Colon polyps Neg Hx      Social History Ms. Hoeppner reports that she quit smoking about 39 years ago. Her smoking use included cigarettes. She started smoking about 54 years ago. She has a 18.00 pack-year smoking history. She has never used smokeless tobacco. Ms. Zeng reports no history of alcohol use.   Review of Systems CONSTITUTIONAL: No weight loss, fever, chills, weakness or fatigue.  HEENT: Eyes: No visual loss, blurred vision, double vision or yellow sclerae.No hearing loss, sneezing, congestion, runny nose or sore throat.  SKIN: No rash or itching.  CARDIOVASCULAR: per hpi RESPIRATORY: No shortness of breath, cough or sputum.  GASTROINTESTINAL: No anorexia, nausea, vomiting or diarrhea. No abdominal pain or blood.  GENITOURINARY: No burning on urination, no polyuria NEUROLOGICAL: No headache, dizziness, syncope, paralysis, ataxia, numbness or tingling in the extremities. No change in bowel or bladder control.  MUSCULOSKELETAL: No muscle, back pain, joint pain or stiffness.  LYMPHATICS: No enlarged nodes. No history of splenectomy.  PSYCHIATRIC: No history of depression or anxiety.  ENDOCRINOLOGIC: No reports of sweating, cold or heat  intolerance. No polyuria or polydipsia.  Marland Kitchen   Physical Examination Today's Vitals   09/20/19 1352  BP: 130/74  Pulse: 83  SpO2: 99%  Weight: 160 lb (72.6 kg)  Height: 5' 2.5" (1.588 m)   Body mass index is 28.8 kg/m.  Gen: resting comfortably, no acute distress HEENT: no scleral icterus, pupils equal round and reactive, no palptable cervical adenopathy,  CV: RRR, no m/r/g, no jvd Resp: Clear to auscultation bilaterally GI: abdomen is soft, non-tender, non-distended, normal bowel sounds, no hepatosplenomegaly MSK: extremities are warm, no edema.  Skin: warm, no rash Neuro:  no focal deficits Psych: appropriate affect   Diagnostic Studies  02/2011 MPI Low risk exercise/Lexiscan Myoview as outlined. Equivocal ST- segment changes were noted in the setting  of lead motion artifact. No chest pain reported. Perfusion imaging is most consistent with breast attenuation without clear evidence of scar or ischemia. LVEF 77%.  04/06/13 Clinic EKG: sinus rhythm, nomral axis, normal intervals, non-specific ST/T changes  04/2013 Echo LVEF 123456, grade I diastolic dysfunction,   Jan 2017 PFTs: normal spirometry, minimal restrictive changes, severely decreased DLCO   Assessment and Plan   1. CAD -no recent symptoms, continue current meds - EKG today shows SR, no ischemic changes  2. HTN  - she is at goal, continue current meds  3. Hyperlipidemia  - continue statin, request pcp labs  Fu 1 year     Arnoldo Lenis, M.D.

## 2019-09-20 NOTE — Patient Instructions (Signed)
Medication Instructions:  Your physician recommends that you continue on your current medications as directed. Please refer to the Current Medication list given to you today.  *If you need a refill on your cardiac medications before your next appointment, please call your pharmacy*   Lab Work: None  If you have labs (blood work) drawn today and your tests are completely normal, you will receive your results only by: Marland Kitchen MyChart Message (if you have MyChart) OR . A paper copy in the mail If you have any lab test that is abnormal or we need to change your treatment, we will call you to review the results.   Testing/Procedures: none   Follow-Up: At [facility], you and your health needs are our priority.  As part of our continuing mission to provide you with exceptional heart care, we have created designated Provider Care Teams.  These Care Teams include your primary Cardiologist (physician) and Advanced Practice Providers (APPs -  Physician Assistants and Nurse Practitioners) who all work together to provide you with the care you need, when you need it.  We recommend signing up for the patient portal called "MyChart".  Sign up information is provided on this After Visit Summary.  MyChart is used to connect with patients for Virtual Visits (Telemedicine).  Patients are able to view lab/test results, encounter notes, upcoming appointments, etc.  Non-urgent messages can be sent to your provider as well.   To learn more about what you can do with MyChart, go to [email or web address].    Your next appointment:   12 month(s)  The format for your next appointment:   In Person  Provider:   Dr.Jonathan Branch   Other Instructions You have been referred to pulmonary. They will call uou to make apt.

## 2019-09-23 ENCOUNTER — Ambulatory Visit: Payer: PPO | Admitting: Cardiology

## 2019-10-10 DIAGNOSIS — E782 Mixed hyperlipidemia: Secondary | ICD-10-CM | POA: Diagnosis not present

## 2019-10-10 DIAGNOSIS — I119 Hypertensive heart disease without heart failure: Secondary | ICD-10-CM | POA: Diagnosis not present

## 2019-10-10 DIAGNOSIS — F5109 Other insomnia not due to a substance or known physiological condition: Secondary | ICD-10-CM | POA: Diagnosis not present

## 2019-10-13 ENCOUNTER — Encounter: Payer: Self-pay | Admitting: Pulmonary Disease

## 2019-10-13 ENCOUNTER — Other Ambulatory Visit: Payer: Self-pay

## 2019-10-13 ENCOUNTER — Ambulatory Visit: Payer: PPO | Admitting: Pulmonary Disease

## 2019-10-13 ENCOUNTER — Other Ambulatory Visit (HOSPITAL_COMMUNITY)
Admission: RE | Admit: 2019-10-13 | Discharge: 2019-10-13 | Disposition: A | Discharge status: Home / Self Care | Payer: PPO | Source: Ambulatory Visit | Attending: Pulmonary Disease | Admitting: Pulmonary Disease

## 2019-10-13 VITALS — BP 140/92 | HR 74 | Temp 97.1°F | Ht 62.5 in | Wt 158.0 lb

## 2019-10-13 DIAGNOSIS — Z87891 Personal history of nicotine dependence: Secondary | ICD-10-CM

## 2019-10-13 DIAGNOSIS — J479 Bronchiectasis, uncomplicated: Secondary | ICD-10-CM | POA: Diagnosis not present

## 2019-10-13 DIAGNOSIS — R0609 Other forms of dyspnea: Secondary | ICD-10-CM

## 2019-10-13 DIAGNOSIS — R0600 Dyspnea, unspecified: Secondary | ICD-10-CM | POA: Diagnosis not present

## 2019-10-13 DIAGNOSIS — K219 Gastro-esophageal reflux disease without esophagitis: Secondary | ICD-10-CM

## 2019-10-13 LAB — COMPREHENSIVE METABOLIC PANEL
ALT: 15 U/L (ref 0–44)
AST: 22 U/L (ref 15–41)
Albumin: 4.3 g/dL (ref 3.5–5.0)
Alkaline Phosphatase: 62 U/L (ref 38–126)
Anion gap: 10 (ref 5–15)
BUN: 11 mg/dL (ref 8–23)
CO2: 28 mmol/L (ref 22–32)
Calcium: 9.4 mg/dL (ref 8.9–10.3)
Chloride: 102 mmol/L (ref 98–111)
Creatinine, Ser: 1.02 mg/dL — ABNORMAL HIGH (ref 0.44–1.00)
GFR calc Af Amer: >60 mL/min (ref >60)
GFR calc non Af Amer: 53 mL/min — ABNORMAL LOW (ref >60)
Glucose, Bld: 98 mg/dL (ref 70–99)
Potassium: 4 mmol/L (ref 3.5–5.1)
Sodium: 140 mmol/L (ref 135–145)
Total Bilirubin: 0.7 mg/dL (ref 0.3–1.2)
Total Protein: 7.1 g/dL (ref 6.5–8.1)

## 2019-10-13 LAB — CBC WITH DIFFERENTIAL/PLATELET
Abs Immature Granulocytes: 0.03 10*3/uL (ref 0.00–0.07)
Basophils Absolute: 0 10*3/uL (ref 0.0–0.1)
Basophils Relative: 0 %
Eosinophils Absolute: 0.2 10*3/uL (ref 0.0–0.5)
Eosinophils Relative: 3 %
HCT: 42.5 % (ref 36.0–46.0)
Hemoglobin: 13.1 g/dL (ref 12.0–15.0)
Immature Granulocytes: 0 %
Lymphocytes Relative: 18 %
Lymphs Abs: 1.3 10*3/uL (ref 0.7–4.0)
MCH: 25.1 pg — ABNORMAL LOW (ref 26.0–34.0)
MCHC: 30.8 g/dL (ref 30.0–36.0)
MCV: 81.6 fL (ref 80.0–100.0)
Monocytes Absolute: 0.6 10*3/uL (ref 0.1–1.0)
Monocytes Relative: 9 %
Neutro Abs: 4.8 10*3/uL (ref 1.7–7.7)
Neutrophils Relative %: 70 %
Platelets: 265 10*3/uL (ref 150–400)
RBC: 5.21 MIL/uL — ABNORMAL HIGH (ref 3.87–5.11)
RDW: 15.5 % (ref 11.5–15.5)
WBC: 6.9 10*3/uL (ref 4.0–10.5)
nRBC: 0 % (ref 0.0–0.2)

## 2019-10-13 MED ORDER — BREO ELLIPTA 100-25 MCG/INH IN AEPB
1.0000 | INHALATION_SPRAY | Freq: Every day | RESPIRATORY_TRACT | 5 refills | Status: DC
Start: 2019-10-13 — End: 2019-11-11

## 2019-10-13 MED ORDER — BREO ELLIPTA 200-25 MCG/INH IN AEPB
1.0000 | INHALATION_SPRAY | Freq: Every day | RESPIRATORY_TRACT | 0 refills | Status: DC
Start: 2019-10-13 — End: 2019-11-11

## 2019-10-13 NOTE — Patient Instructions (Signed)
Breo one puff daily, and rinse mouth after each use  Will schedule high resolution CT chest, pulmonary function test, and lab tests  Follow up in 4 weeks

## 2019-10-13 NOTE — Progress Notes (Signed)
[facility]  Chief Complaint  Patient presents with  . Consult    Patient has shortness of breath with exertion. Patient sometimes has a dry cough when is its hot or when she is laying flat/not elevated.     Constitutional:  BP (!) 140/92 (BP Location: Right Arm, Patient Position: Sitting, Cuff Size: Normal)   Pulse 74   Temp (!) 97.1 F (36.2 C) (Temporal)   Ht 5' 2.5" (1.588 m)   Wt 158 lb (71.7 kg)   SpO2 98%   BMI 28.44 kg/m   Past Medical History:  CAD, HTN, HLD, CVA, GERD, Thalassemia minor, GI bleed from AVM  Summary:  Nancy Liu is a 79 y.o. female former smoker with dyspnea.  Subjective:   Previously seen by Dr. Melvyn Novas and Dr. Luan Pulling.  Has noticed trouble with her breathing for years.  Used to have trouble with acid reflux.  Better since she has been on prevacid.  She quit smoking years ago.  No history of pneumonia or TB.  She is from [facility].  Retired - used to work Engineer, structural.  She had CT chest and PFT in 2017.  Showed bronchiectasis in lower lobes, and diffusion defect.  Hasn't been tried on inhalers before.  Had dry cough.  Gets winded when walking up steps.  Not having fever, hemoptysis, chest pain, wheeze, sinus congestion, post nasal drip.  Notices her cough is worse if she lays flat; has to sleep with her head elevated.  No skin rash, joint swelling, or leg swelling.  She received Moderna COVID vaccine.  Physical Exam:   Appearance - well kempt  ENMT - no sinus tenderness, no nasal discharge, no oral exudate, Mallampati 4, scalloped tongue  Respiratory - no wheeze, or rales  CV - regular rate and rhythm, no murmurs  GI - soft, non tender  Lymph - no adenopathy noted in neck  Ext - no edema  Skin - no rashes  Neuro - normal strength, oriented x 3  Psych - normal mood and affect  Discussion:  She has progressive dyspnea on exertion dry cough made worse when laying flat.  She has history  of reflux and appears controlled on PPI therapy.  Previous imaging study shows lower lobe and lingular bronchiectasis, and PFT showed moderate to severe diffusion defect.  She has prior remote history of smoking.  Assessment/Plan:   Dyspnea on exertion with bronchiectasis, history of smoking and reflux. - repeat HRCT chest and pulmonary function testing - lab testing for CBC with diff, CMET, IgE/IgG/IgM/IgA, ANA, ANCA, RF - trial of breo - continue prevacid; might need further assessment of reflux  History of mild sleep apnea. - will need to discuss in more detail at next visit about whether additional assessment is needed for this  A total of 47 minutes addressing patient care on the day of the visit.  Follow up:  Patient Instructions  Breo one puff daily, and rinse mouth after each use  Will schedule high resolution CT chest, pulmonary function test, and lab tests  Follow up in 4 weeks   Signature:  Chesley Mires, MD [place] Pager: [phone] 10/13/2019, 9:34 AM  Flow Sheet     Pulmonary tests:   PFT 06/13/15 >> FEV1 1.83 (112%), FEV1% 85, TLC 3.82 (79%), DLCO 47%  Chest imaging:   HRCT chest 09/03/15 >> few scattered nodules up to 9 mm, mild GGO and minimal BTX with distortion in lingula and lower  lobes from scarring  Sleep testing:   PSG 09/09/17 >> AHI 8.6, SpO2 low 87%  Cardiac tests:   Echo 03/14/16 >> EF 55 to 60%, mod LVH, grade 1 DD  Medications:   Allergies as of 10/13/2019      Reactions   Protonix [pantoprazole Sodium]    DRY LIPS   Shellfish Allergy    Unknown reaction      Medication List       Accurate as of Oct 13, 2019  9:34 AM. If you have any questions, ask your nurse or doctor.        STOP taking these medications   predniSONE 50 MG tablet Commonly known as: DELTASONE Stopped by: Chesley Mires, MD     TAKE these medications   amLODipine 10 MG tablet Commonly known as: NORVASC Take 1 tablet by mouth once  daily   aspirin 81 MG tablet Take 81 mg by mouth daily.   augmented betamethasone dipropionate 0.05 % ointment Commonly known as: DIPROLENE-AF Apply topically as needed.   Breo Ellipta 100-25 MCG/INH Aepb Generic drug: fluticasone furoate-vilanterol Inhale 1 puff into the lungs daily. Started by: Chesley Mires, MD   carvedilol 6.25 MG tablet Commonly known as: COREG Take 1 tablet by mouth twice daily   ergocalciferol 1.25 MG (50000 UT) capsule Commonly known as: VITAMIN D2 Take 1 capsule (50,000 Units total) by mouth once a week.   irbesartan-hydrochlorothiazide 300-12.5 MG tablet Commonly known as: AVALIDE Take 1 tablet by mouth daily.   Isopto Tears 0.5 % Soln Generic drug: Hypromellose Place 1 drop into both eyes 3 (three) times daily as needed (for dry eyes).   lansoprazole 30 MG capsule Commonly known as: PREVACID Take 1 capsule (30 mg total) by mouth daily before breakfast. What changed:   when to take this  reasons to take this   lubiprostone 24 MCG capsule Commonly known as: Amitiza Take 1 capsule (24 mcg total) by mouth 2 (two) times daily with a meal.   metFORMIN 500 MG tablet Commonly known as: GLUCOPHAGE Take 500 mg by mouth daily with breakfast.   multivitamin tablet Take 1 tablet by mouth as needed.   Poly-Iron 150 Forte 150-0.025-1 MG Caps Generic drug: Iron Polysacch Cmplx-B12-FA Take 1 capsule by mouth once daily   rosuvastatin 20 MG tablet Commonly known as: CRESTOR Take 1 tablet by mouth once daily   Vyzulta 0.024 % Soln Generic drug: Latanoprostene Bunod Place 1 drop into both eyes daily.       Past Surgical History:  She  has a past surgical history that includes Vaginal hysterectomy (1980); Colonoscopy (2006); Inguinal hernia repair (1970s); Dilation and curettage of uterus; Colonoscopy (2000); Esophagogastroduodenoscopy (2000); Esophagogastroduodenoscopy (2006); Colonoscopy (N/A, 03/03/2016); Esophagogastroduodenoscopy (N/A,  03/03/2016); Esophagogastroduodenoscopy (N/A, 06/03/2016); and Excision of keloid (Left, 03/26/2018).  Family History:  Her family history includes Cancer in her mother; Diabetes in her sister; Heart disease in her mother.  Social History:  She  reports that she quit smoking about 39 years ago. Her smoking use included cigarettes. She started smoking about 54 years ago. She has a 18.00 pack-year smoking history. She has never used smokeless tobacco. She reports that she does not drink alcohol or use drugs.

## 2019-10-13 NOTE — Addendum Note (Signed)
Addended by: Lia Foyer R on: 10/13/2019 03:53 PM   Modules accepted: Orders

## 2019-10-14 LAB — IGG, IGA, IGM
IgA: 168 mg/dL (ref 64–422)
IgG (Immunoglobin G), Serum: 735 mg/dL (ref 586–1602)
IgM (Immunoglobulin M), Srm: 27 mg/dL (ref 26–217)

## 2019-10-14 LAB — RHEUMATOID FACTOR: Rheumatoid fact SerPl-aCnc: <10 IU/mL (ref 0.0–13.9)

## 2019-10-14 LAB — ANA W/REFLEX IF POSITIVE: Anti Nuclear Antibody (ANA): NEGATIVE

## 2019-10-17 LAB — ANCA TITERS
Atypical P-ANCA titer: <1:20 {titer}
C-ANCA: <1:20 {titer}
P-ANCA: <1:20 {titer}

## 2019-10-17 LAB — IGE: IgE (Immunoglobulin E), Serum: 16 IU/mL (ref 6–495)

## 2019-10-18 ENCOUNTER — Telehealth: Payer: Self-pay | Admitting: Pulmonary Disease

## 2019-10-18 NOTE — Telephone Encounter (Signed)
Called and spoke with pt letting her know the results of labwork and she verbalized understanding. Nothing further needed. 

## 2019-10-18 NOTE — Telephone Encounter (Signed)
CMP Latest Ref Rng & Units 10/13/2019 06/13/2019 02/14/2019  Glucose 70 - 99 mg/dL 98 104(H) 95  BUN 8 - 23 mg/dL 11 14 18   Creatinine 0.44 - 1.00 mg/dL 1.02(H) 1.00 1.01(H)  Sodium 135 - 145 mmol/L 140 139 140  Potassium 3.5 - 5.1 mmol/L 4.0 3.5 3.9  Chloride 98 - 111 mmol/L 102 99 103  CO2 22 - 32 mmol/L 28 28 27   Calcium 8.9 - 10.3 mg/dL 9.4 9.4 9.2  Total Protein 6.5 - 8.1 g/dL 7.1 7.2 6.8  Total Bilirubin 0.3 - 1.2 mg/dL 0.7 0.6 0.7  Alkaline Phos 38 - 126 U/L 62 62 63  AST 15 - 41 U/L 22 19 18   ALT 0 - 44 U/L 15 13 11     CBC Latest Ref Rng & Units 10/13/2019 07/14/2019 06/13/2019  WBC 4.0 - 10.5 K/uL 6.9 9.3 8.4  Hemoglobin 12.0 - 15.0 g/dL 13.1 12.8 12.4  Hematocrit 36.0 - 46.0 % 42.5 40.2 40.7  Platelets 150 - 400 K/uL 265 293 266   IgE 10/13/19 >> 16 IgG/IgA/IgM 10/13/19 >> 735/168/27 Serology 10/13/19 >> RF/ANA/ANCA negative   Please let her know her lab tests were all normal.

## 2019-10-20 ENCOUNTER — Ambulatory Visit (HOSPITAL_COMMUNITY)
Admission: RE | Admit: 2019-10-20 | Discharge: 2019-10-20 | Disposition: A | Discharge status: Home / Self Care | Payer: PPO | Source: Ambulatory Visit | Attending: Pulmonary Disease | Admitting: Pulmonary Disease

## 2019-10-20 ENCOUNTER — Telehealth: Payer: Self-pay | Admitting: Pulmonary Disease

## 2019-10-20 ENCOUNTER — Other Ambulatory Visit: Payer: Self-pay

## 2019-10-20 DIAGNOSIS — J479 Bronchiectasis, uncomplicated: Secondary | ICD-10-CM | POA: Insufficient documentation

## 2019-10-20 DIAGNOSIS — R0602 Shortness of breath: Secondary | ICD-10-CM | POA: Diagnosis not present

## 2019-10-20 NOTE — Telephone Encounter (Signed)
Spoke with the pt  She states that she was returning a phone call from today  I see no record of anyone placing a call to her  Nancy Liu spoke with her on 5/18 regarding her lab results  She had CT Chest done today  Pt asking about the results of this  Please advise, thanks

## 2019-10-21 ENCOUNTER — Other Ambulatory Visit: Payer: Self-pay | Admitting: Gastroenterology

## 2019-10-21 NOTE — Telephone Encounter (Signed)
HRCT chest 10/20/19 >> atherosclerosis, tubular BTX b/l lower lobes, scarring LLL and lingular, scattered nodules up to 9 mm stable since 2017/benign   Please let Nancy Liu know CT chest continues to show changes of bronchiectasis (enlarged airways) likely related to prior respiratory infection and episodes of acid reflux.  She should continue using prevacid for reflux therapy, and continue breo for Nancy Liu breathing.  Will review in more detail at Nancy Liu next visit.

## 2019-10-21 NOTE — Telephone Encounter (Signed)
Advised pt of results. Pt understood and nothing further is needed.   

## 2019-11-04 ENCOUNTER — Other Ambulatory Visit (HOSPITAL_COMMUNITY)
Admission: RE | Admit: 2019-11-04 | Discharge: 2019-11-04 | Disposition: A | Discharge status: Home / Self Care | Payer: PPO | Source: Ambulatory Visit | Attending: Pulmonary Disease | Admitting: Pulmonary Disease

## 2019-11-04 ENCOUNTER — Other Ambulatory Visit: Payer: Self-pay

## 2019-11-04 DIAGNOSIS — Z20822 Contact with and (suspected) exposure to covid-19: Secondary | ICD-10-CM | POA: Diagnosis not present

## 2019-11-04 DIAGNOSIS — Z01812 Encounter for preprocedural laboratory examination: Secondary | ICD-10-CM | POA: Diagnosis not present

## 2019-11-04 LAB — SARS CORONAVIRUS 2 (TAT 6-24 HRS): SARS Coronavirus 2: NEGATIVE

## 2019-11-08 ENCOUNTER — Other Ambulatory Visit: Payer: Self-pay

## 2019-11-08 ENCOUNTER — Ambulatory Visit (HOSPITAL_COMMUNITY)
Admission: RE | Admit: 2019-11-08 | Discharge: 2019-11-08 | Disposition: A | Discharge status: Home / Self Care | Payer: PPO | Source: Ambulatory Visit | Attending: Pulmonary Disease | Admitting: Pulmonary Disease

## 2019-11-08 DIAGNOSIS — J479 Bronchiectasis, uncomplicated: Secondary | ICD-10-CM | POA: Insufficient documentation

## 2019-11-08 LAB — PULMONARY FUNCTION TEST
DL/VA % pred: 106 %
DL/VA: 4.41 ml/min/mmHg/L
DLCO unc % pred: 77 %
DLCO unc: 14.02 ml/min/mmHg
FEF 25-75 Post: 2.62 L/sec
FEF 25-75 Pre: 2.48 L/sec
FEF2575-%Change-Post: 5 %
FEF2575-%Pred-Post: 198 %
FEF2575-%Pred-Pre: 187 %
FEV1-%Change-Post: 0 %
FEV1-%Pred-Post: 118 %
FEV1-%Pred-Pre: 117 %
FEV1-Post: 1.8 L
FEV1-Pre: 1.78 L
FEV1FVC-%Change-Post: 3 %
FEV1FVC-%Pred-Pre: 114 %
FEV6-%Change-Post: -2 %
FEV6-%Pred-Post: 107 %
FEV6-%Pred-Pre: 109 %
FEV6-Post: 2.01 L
FEV6-Pre: 2.06 L
FEV6FVC-%Pred-Post: 104 %
FEV6FVC-%Pred-Pre: 104 %
FVC-%Change-Post: -2 %
FVC-%Pred-Post: 102 %
FVC-%Pred-Pre: 104 %
FVC-Post: 2.01 L
FVC-Pre: 2.06 L
Post FEV1/FVC ratio: 89 %
Post FEV6/FVC ratio: 100 %
Pre FEV1/FVC ratio: 87 %
Pre FEV6/FVC Ratio: 100 %
RV % pred: 74 %
RV: 1.67 L
TLC % pred: 81 %
TLC: 3.91 L

## 2019-11-08 MED ORDER — ALBUTEROL SULFATE (2.5 MG/3ML) 0.083% IN NEBU
2.5000 mg | INHALATION_SOLUTION | Freq: Once | RESPIRATORY_TRACT | Status: AC
  Administered 2019-11-08: 2.5 mg via RESPIRATORY_TRACT

## 2019-11-11 ENCOUNTER — Other Ambulatory Visit: Payer: Self-pay

## 2019-11-11 ENCOUNTER — Encounter: Payer: Self-pay | Admitting: Pulmonary Disease

## 2019-11-11 ENCOUNTER — Ambulatory Visit: Payer: PPO | Admitting: Pulmonary Disease

## 2019-11-11 VITALS — BP 140/70 | HR 72 | Temp 97.1°F | Ht 62.5 in | Wt 159.0 lb

## 2019-11-11 DIAGNOSIS — J479 Bronchiectasis, uncomplicated: Secondary | ICD-10-CM

## 2019-11-11 DIAGNOSIS — Z8669 Personal history of other diseases of the nervous system and sense organs: Secondary | ICD-10-CM | POA: Diagnosis not present

## 2019-11-11 NOTE — Progress Notes (Signed)
[facility]  Chief Complaint  Patient presents with  . Follow-up    Patient had HRCT on 5/20 and some lab work. Patient states she did not notice a difference with the [facility] but is not sure she was doing it right. Only really coughs when she lays flat and in the morning.      Constitutional:  BP 140/70 (BP Location: Right Arm, Patient Position: Sitting, Cuff Size: Normal)   Pulse 72   Temp (!) 97.1 F (36.2 C) (Temporal)   Ht 5' 2.5" (1.588 m)   Wt 159 lb (72.1 kg)   SpO2 98%   BMI 28.62 kg/m   Past Medical History:  CAD, HTN, HLD, CVA, GERD, Thalassemia minor, GI bleed from AVM  Summary:  Nancy Liu is a 79 y.o. female former smoker with dyspnea in setting of bronchiectasis.  Subjective:   Since her last visit she has lab tests, PFT, and HRCT chest.  Lab tests were normal.  PFT showed improvement in diffusion capacity compared to 2017.  CT chest showed stable changes from bronchiectasis and lung nodules.  She is not currently having cough, wheeze, or sputum.  Not having fever, sinus congestion, or sore throat.  Doesn't feel like her breathing limits her activity level.  Physical Exam:   Appearance - well kempt   ENMT - no sinus tenderness, no oral exudate, no LAN, Mallampati 3 airway, no stridor  Respiratory - equal breath sounds bilaterally, no wheezing or rales  CV - s1s2 regular rate and rhythm, no murmurs  Ext - no clubbing, no edema  Skin - no rashes  Psych - normal mood and affect  Assessment/Plan:   Bronchiectasis, history of smoking and reflux. - symptoms improved since last visit - reviewed her CT chest, labs, and PFT with her - discussed importance of phlegm clearance; prn mucinex for now - d/c breo since she didn't have symptomatic benefit, and is concerned about cost of therapy  History of mild sleep apnea. - explained how some of her nocturnal cough symptoms could be related to persistent sleep  apnea - she will monitor her symptoms and call if she wants to repeat home sleep study  A total of  22 minutes spent addressing patient care issues on day of visit.   Follow up:  Patient Instructions  Follow up in 6 months   Signature:  Chesley Mires, MD [place] Pager: [phone] 11/11/2019, 9:46 AM  Flow Sheet     Pulmonary tests:   PFT 06/13/15 >> FEV1 1.83 (112%), FEV1% 85, TLC 3.82 (79%), DLCO 47%  IgE 10/13/19 >> 16  IgG/IgA/IgM 10/13/19 >> 735/168/27  Serology 10/13/19 >> RF/ANA/ANCA negative  PFT 11/08/19 >> FEV1 1.80 (118%), FEV1% 89, TLC 3.91 (81%), DLCO 77%  Chest imaging:   HRCT chest 09/03/15 >> few scattered nodules up to 9 mm, mild GGO and minimal BTX with distortion in lingula and lower lobes from scarring  HRCT chest 10/20/19 >> atherosclerosis, tubular BTX b/l lower lobes, scarring LLL and lingular, scattered nodules up to 9 mm stable since 2017/benign  Sleep testing:   PSG 09/09/17 >> AHI 8.6, SpO2 low 87%  Cardiac tests:   Echo 03/14/16 >> EF 55 to 60%, mod LVH, grade 1 DD  Medications:   Allergies as of 11/11/2019      Reactions   Protonix [pantoprazole Sodium]    DRY LIPS   Shellfish Allergy    Unknown reaction      Medication  List       Accurate as of November 11, 2019  9:46 AM. If you have any questions, ask your nurse or doctor.        STOP taking these medications   Breo Ellipta 100-25 MCG/INH Aepb Generic drug: fluticasone furoate-vilanterol Stopped by: Chesley Mires, MD   Breo Ellipta 200-25 MCG/INH Aepb Generic drug: fluticasone furoate-vilanterol Stopped by: Chesley Mires, MD   lubiprostone 24 MCG capsule Commonly known as: Amitiza Stopped by: Chesley Mires, MD     TAKE these medications   amLODipine 10 MG tablet Commonly known as: NORVASC Take 1 tablet by mouth once daily   aspirin 81 MG tablet Take 81 mg by mouth daily.   augmented betamethasone dipropionate 0.05 % ointment Commonly known as:  DIPROLENE-AF Apply topically as needed.   carvedilol 6.25 MG tablet Commonly known as: COREG Take 1 tablet by mouth twice daily   ergocalciferol 1.25 MG (50000 UT) capsule Commonly known as: VITAMIN D2 Take 1 capsule (50,000 Units total) by mouth once a week.   irbesartan-hydrochlorothiazide 300-12.5 MG tablet Commonly known as: AVALIDE Take 1 tablet by mouth daily.   Isopto Tears 0.5 % Soln Generic drug: Hypromellose Place 1 drop into both eyes 3 (three) times daily as needed (for dry eyes).   lansoprazole 30 MG capsule Commonly known as: PREVACID TAKE 1 CAPSULE BY MOUTH ONCE DAILY BEFORE BREAKFAST   metFORMIN 500 MG tablet Commonly known as: GLUCOPHAGE Take 500 mg by mouth daily with breakfast.   multivitamin tablet Take 1 tablet by mouth as needed.   Poly-Iron 150 Forte 150-0.025-1 MG Caps Generic drug: Iron Polysacch Cmplx-B12-FA Take 1 capsule by mouth once daily   rosuvastatin 20 MG tablet Commonly known as: CRESTOR Take 1 tablet by mouth once daily   Vyzulta 0.024 % Soln Generic drug: Latanoprostene Bunod Place 1 drop into both eyes daily.       Past Surgical History:  She  has a past surgical history that includes Vaginal hysterectomy (1980); Colonoscopy (2006); Inguinal hernia repair (1970s); Dilation and curettage of uterus; Colonoscopy (2000); Esophagogastroduodenoscopy (2000); Esophagogastroduodenoscopy (2006); Colonoscopy (N/A, 03/03/2016); Esophagogastroduodenoscopy (N/A, 03/03/2016); Esophagogastroduodenoscopy (N/A, 06/03/2016); and Excision of keloid (Left, 03/26/2018).  Family History:  Her family history includes Cancer in her mother; Diabetes in her sister; Heart disease in her mother.  Social History:  She  reports that she quit smoking about 39 years ago. Her smoking use included cigarettes. She started smoking about 54 years ago. She has a 18.00 pack-year smoking history. She has never used smokeless tobacco. She reports that she does not drink  alcohol and does not use drugs.

## 2019-11-11 NOTE — Patient Instructions (Signed)
Follow up in 6 months 

## 2019-11-25 ENCOUNTER — Telehealth: Payer: Self-pay | Admitting: Cardiology

## 2019-11-25 NOTE — Telephone Encounter (Signed)
Pt. Has a question about her medication. Request a call back

## 2019-11-25 NOTE — Telephone Encounter (Signed)
Attempt to reach, [id number] 

## 2019-11-25 NOTE — Telephone Encounter (Signed)
Pt has questions about medications  Please call [phone]   Thanks renee

## 2019-11-30 NOTE — Telephone Encounter (Signed)
Patient states she was told to take avalide in the am and amlodipine in the pm to get better coverage of her BP.

## 2019-12-13 ENCOUNTER — Inpatient Hospital Stay (HOSPITAL_COMMUNITY): Payer: PPO | Attending: Hematology

## 2019-12-13 ENCOUNTER — Other Ambulatory Visit: Payer: Self-pay

## 2019-12-13 DIAGNOSIS — Z79899 Other long term (current) drug therapy: Secondary | ICD-10-CM | POA: Diagnosis not present

## 2019-12-13 DIAGNOSIS — I10 Essential (primary) hypertension: Secondary | ICD-10-CM | POA: Insufficient documentation

## 2019-12-13 DIAGNOSIS — D509 Iron deficiency anemia, unspecified: Secondary | ICD-10-CM | POA: Insufficient documentation

## 2019-12-13 DIAGNOSIS — E559 Vitamin D deficiency, unspecified: Secondary | ICD-10-CM | POA: Diagnosis not present

## 2019-12-13 DIAGNOSIS — Z833 Family history of diabetes mellitus: Secondary | ICD-10-CM | POA: Insufficient documentation

## 2019-12-13 DIAGNOSIS — K219 Gastro-esophageal reflux disease without esophagitis: Secondary | ICD-10-CM | POA: Diagnosis not present

## 2019-12-13 DIAGNOSIS — Z8249 Family history of ischemic heart disease and other diseases of the circulatory system: Secondary | ICD-10-CM | POA: Insufficient documentation

## 2019-12-13 DIAGNOSIS — I2510 Atherosclerotic heart disease of native coronary artery without angina pectoris: Secondary | ICD-10-CM | POA: Insufficient documentation

## 2019-12-13 DIAGNOSIS — Z87891 Personal history of nicotine dependence: Secondary | ICD-10-CM | POA: Diagnosis not present

## 2019-12-13 DIAGNOSIS — E785 Hyperlipidemia, unspecified: Secondary | ICD-10-CM | POA: Insufficient documentation

## 2019-12-13 DIAGNOSIS — E119 Type 2 diabetes mellitus without complications: Secondary | ICD-10-CM | POA: Insufficient documentation

## 2019-12-13 LAB — IRON AND TIBC
Iron: 54 ug/dL (ref 28–170)
Saturation Ratios: 15 % (ref 10.4–31.8)
TIBC: 365 ug/dL (ref 250–450)
UIBC: 311 ug/dL

## 2019-12-13 LAB — CBC WITH DIFFERENTIAL/PLATELET
Abs Immature Granulocytes: 0.03 10*3/uL (ref 0.00–0.07)
Basophils Absolute: 0 10*3/uL (ref 0.0–0.1)
Basophils Relative: 1 %
Eosinophils Absolute: 0.3 10*3/uL (ref 0.0–0.5)
Eosinophils Relative: 4 %
HCT: 40.8 % (ref 36.0–46.0)
Hemoglobin: 12.6 g/dL (ref 12.0–15.0)
Immature Granulocytes: 0 %
Lymphocytes Relative: 18 %
Lymphs Abs: 1.2 10*3/uL (ref 0.7–4.0)
MCH: 24.6 pg — ABNORMAL LOW (ref 26.0–34.0)
MCHC: 30.9 g/dL (ref 30.0–36.0)
MCV: 79.5 fL — ABNORMAL LOW (ref 80.0–100.0)
Monocytes Absolute: 0.5 10*3/uL (ref 0.1–1.0)
Monocytes Relative: 8 %
Neutro Abs: 4.7 10*3/uL (ref 1.7–7.7)
Neutrophils Relative %: 69 %
Platelets: 262 10*3/uL (ref 150–400)
RBC: 5.13 MIL/uL — ABNORMAL HIGH (ref 3.87–5.11)
RDW: 14.9 % (ref 11.5–15.5)
WBC: 6.7 10*3/uL (ref 4.0–10.5)
nRBC: 0 % (ref 0.0–0.2)

## 2019-12-13 LAB — COMPREHENSIVE METABOLIC PANEL
ALT: 14 U/L (ref 0–44)
AST: 18 U/L (ref 15–41)
Albumin: 4.1 g/dL (ref 3.5–5.0)
Alkaline Phosphatase: 55 U/L (ref 38–126)
Anion gap: 10 (ref 5–15)
BUN: 15 mg/dL (ref 8–23)
CO2: 26 mmol/L (ref 22–32)
Calcium: 9.1 mg/dL (ref 8.9–10.3)
Chloride: 103 mmol/L (ref 98–111)
Creatinine, Ser: 1.15 mg/dL — ABNORMAL HIGH (ref 0.44–1.00)
GFR calc Af Amer: 53 mL/min — ABNORMAL LOW (ref >60)
GFR calc non Af Amer: 46 mL/min — ABNORMAL LOW (ref >60)
Glucose, Bld: 100 mg/dL — ABNORMAL HIGH (ref 70–99)
Potassium: 3.8 mmol/L (ref 3.5–5.1)
Sodium: 139 mmol/L (ref 135–145)
Total Bilirubin: 0.6 mg/dL (ref 0.3–1.2)
Total Protein: 6.9 g/dL (ref 6.5–8.1)

## 2019-12-13 LAB — FERRITIN: Ferritin: 21 ng/mL (ref 11–307)

## 2019-12-13 LAB — LACTATE DEHYDROGENASE: LDH: 162 U/L (ref 98–192)

## 2019-12-13 LAB — VITAMIN D 25 HYDROXY (VIT D DEFICIENCY, FRACTURES): Vit D, 25-Hydroxy: 118.52 ng/mL — ABNORMAL HIGH (ref 30–100)

## 2019-12-13 LAB — FOLATE: Folate: >100 ng/mL (ref >5.9)

## 2019-12-13 LAB — VITAMIN B12: Vitamin B-12: 867 pg/mL (ref 180–914)

## 2019-12-15 ENCOUNTER — Other Ambulatory Visit: Payer: Self-pay

## 2019-12-15 ENCOUNTER — Encounter: Payer: Self-pay | Admitting: Gastroenterology

## 2019-12-15 ENCOUNTER — Ambulatory Visit: Payer: PPO | Admitting: Gastroenterology

## 2019-12-15 VITALS — BP 133/81 | HR 69 | Temp 97.1°F | Ht 62.0 in | Wt 157.4 lb

## 2019-12-15 DIAGNOSIS — R634 Abnormal weight loss: Secondary | ICD-10-CM | POA: Diagnosis not present

## 2019-12-15 DIAGNOSIS — D500 Iron deficiency anemia secondary to blood loss (chronic): Secondary | ICD-10-CM

## 2019-12-15 NOTE — Patient Instructions (Signed)
I am so glad you are doing well!  Please call if any abdominal pain, feeling full off small amounts of food, nausea, vomiting, weakness, weight loss.  I would like to see you in 3 months to make sure all is stable.  I enjoyed seeing you again today! As you know, I value our relationship and want to provide genuine, compassionate, and quality care. I welcome your feedback. If you receive a survey regarding your visit,  I greatly appreciate you taking time to fill this out. See you next time!  Annitta Needs, PhD, ANP-BC [facility]

## 2019-12-15 NOTE — Progress Notes (Signed)
Referring Provider: Lucianne Lei, MD Primary Care Physician:  Lucianne Lei, MD  Primary GI: previously Dr. Oneida Alar   Chief Complaint  Patient presents with  . Follow-up    HPI:   Nancy Liu is a 79 y.o. female presenting today with a history of IDA, EGD and colonoscopy in October 2017 revealed many non-bleeding gastric ulcers, gastroduodenitis (no H. 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 Diverticulosis and non-bleeding hemorrhoids. Repeat EGD in January 2018 showederosivegastritis butulcers had healed.Chronic constipation. Continues on aspirin as benefits outweigh the risk. Per Dr. Oneida Alar, no indication for capsule study. Chronic constipation. Weight loss since Sept 2020.  Noted to have lost 12 lbs at last visit in Feb 2021. She has now gained about 5 lbs. Baseline weight in low 160s past few years. In 2018 she was in the 170s.   CT in Feb 2021 with questionable fullness in pancreatic head. 6 mm cyst in pancreatic body. MRCP negative for mass, ductal dilation  Eating 2-3 meals a day, appetite improving. No amitiza, BM every other day. Productive. Prevacid daily. Stool is dark on iron. No hematochezia. Doing much better. She had been grieving her best girlfriend's cancer illness, who has now passed away. She feels much improved.   Past Medical History:  Diagnosis Date  . Arteriosclerotic cardiovascular disease (ASCVD) 2001   Bare-metal stent placed in the right coronary artery in 12/01; residual 50% lesion of the first diagonal and mid LAD  . Cerebrovascular disease   . Cyst, dermoid, arm, left 03/26/2018  . Diabetes mellitus    excellent control with a low-dose of a single oral agent  . GERD (gastroesophageal reflux disease)    with ulcers  . History of right coronary artery stent placement 2000  . Hyperlipidemia   . Hypertension   . Thalassemia minor   . Tobacco abuse, in remission    35 pack years; Quit in 1980    Past Surgical History:    Procedure Laterality Date  . COLONOSCOPY  2006   Dr. Collene Mares: normal  . COLONOSCOPY  2000   Dr. Everlean Cherry: normal   . COLONOSCOPY N/A 03/03/2016   Dr. Oneida Alar: diverticulosis, non-bleeding hemorrhoids, redundant left colon.  Marland Kitchen DILATION AND CURETTAGE OF UTERUS    . ESOPHAGOGASTRODUODENOSCOPY  2000   Dr. Everlean Cherry: gastritis   . ESOPHAGOGASTRODUODENOSCOPY  2006   Dr. Collene Mares: small hiatal hernia, gastritis, negative H.pylori   . ESOPHAGOGASTRODUODENOSCOPY N/A 03/03/2016   Procedure: ESOPHAGOGASTRODUODENOSCOPY (EGD);  Surgeon: Danie Binder, MD;  Location[facility];  Service: Endoscopy;  Laterality: N/A;  . ESOPHAGOGASTRODUODENOSCOPY N/A 06/03/2016   Dr. Oneida Alar: nonaggressive gastritis due to aspirin use. Previous ulcers had healed.  Marland Kitchen EXCISION OF KELOID Left 03/26/2018   Procedure: EXCISION OF LEFT ARM MASS;  Surgeon: Fanny Skates, MD;  Location: [facility];  Service: General;  Laterality: Left;  . INGUINAL HERNIA REPAIR  1970s   Right  . VAGINAL HYSTERECTOMY  1980   Unilateral oophorectomy    Current Outpatient Medications  Medication Sig Dispense Refill  . amLODipine (NORVASC) 10 MG tablet Take 1 tablet by mouth once daily 90 tablet 3  . aspirin 81 MG tablet Take 81 mg by mouth daily.      Marland Kitchen augmented betamethasone dipropionate (DIPROLENE-AF) 0.05 % ointment Apply topically as needed.     . carvedilol (COREG) 6.25 MG tablet Take 1 tablet by mouth twice daily 180 tablet 0  . ergocalciferol (VITAMIN D2) 1.25 MG (50000 UT) capsule Take  1 capsule (50,000 Units total) by mouth once a week. 16 capsule 3  . Hypromellose (ISOPTO TEARS) 0.5 % SOLN Place 1 drop into both eyes 3 (three) times daily as needed (for dry eyes).    . irbesartan-hydrochlorothiazide (AVALIDE) 300-12.5 MG tablet Take 1 tablet by mouth daily.     . lansoprazole (PREVACID) 30 MG capsule TAKE 1 CAPSULE BY MOUTH ONCE DAILY BEFORE BREAKFAST 30 capsule 5  . Latanoprostene Bunod (VYZULTA) 0.024 % SOLN Place 1 drop  into both eyes daily.    . metFORMIN (GLUCOPHAGE) 500 MG tablet Take 500 mg by mouth daily with breakfast.    . Multiple Vitamin (MULTIVITAMIN) tablet Take 1 tablet by mouth as needed.     Marland Kitchen POLY-IRON 150 FORTE 150-25-1 MG-MCG-MG CAPS Take 1 capsule by mouth once daily 90 capsule 6  . rosuvastatin (CRESTOR) 20 MG tablet Take 1 tablet by mouth once daily 90 tablet 0   No current facility-administered medications for this visit.    Allergies as of 12/15/2019 - Review Complete 12/15/2019  Allergen Reaction Noted  . Protonix [pantoprazole sodium]  03/27/2016  . Shellfish allergy  02/18/2011    Family History  Problem Relation Age of Onset  . Diabetes Sister   . Heart disease Mother        also hypertension and asthma  . Cancer Mother   . Colon cancer Neg Hx   . Colon polyps Neg Hx     Social History   Socioeconomic History  . Marital status: Divorced    Spouse name: Not on file  . Number of children: 3  . Years of education: Not on file  . Highest education level: Not on file  Occupational History  . Occupation: Retired    Comment: [place] work  Tobacco Use  . Smoking status: Former Smoker    Packs/day: 1.00    Years: 18.00    Pack years: 18.00    Types: Cigarettes    Start date: 06/02/1965    Quit date: 06/09/1980    Years since quitting: 39.5  . Smokeless tobacco: Never Used  Vaping Use  . Vaping Use: Never used  Substance and Sexual Activity  . Alcohol use: No    Alcohol/week: 0.0 standard drinks  . Drug use: No  . Sexual activity: Never  Other Topics Concern  . Not on file  Social History Narrative   Lives with daughter    Social Determinants of Health   Financial Resource Strain:   . Difficulty of Paying Living Expenses:   Food Insecurity:   . Worried About Charity fundraiser in the Last Year:   . Arboriculturist in the Last Year:   Transportation Needs:   . Film/video editor (Medical):   Marland Kitchen Lack of Transportation (Non-Medical):   Physical  Activity:   . Days of Exercise per Week:   . Minutes of Exercise per Session:   Stress:   . Feeling of Stress :   Social Connections:   . Frequency of Communication with Friends and Family:   . Frequency of Social Gatherings with Friends and Family:   . Attends Religious Services:   . Active Member of Clubs or Organizations:   . Attends Archivist Meetings:   Marland Kitchen Marital Status:     Review of Systems: Gen: Denies fever, chills, anorexia. Denies fatigue, weakness, weight loss.  CV: Denies chest pain, palpitations, syncope, peripheral edema, and claudication. Resp: Denies dyspnea at rest, cough, wheezing, coughing up  blood, and pleurisy. GI: see HPI Derm: Denies rash, itching, dry skin Psych: Denies depression, anxiety, memory loss, confusion. No homicidal or suicidal ideation.  Heme: Denies bruising, bleeding, and enlarged lymph nodes.  Physical Exam: BP 133/81   Pulse 69   Temp (!) 97.1 F (36.2 C) (Oral)   Ht 5\' 2"  (1.575 m)   Wt 157 lb 6.4 oz (71.4 kg)   BMI 28.79 kg/m  General:   Alert and oriented. No distress noted. Pleasant and cooperative.  Head:  Normocephalic and atraumatic. Eyes:  Conjuctiva clear without scleral icterus. Mouth:  Mask in place Abdomen:  +BS, soft, non-tender and non-distended. No rebound or guarding. No HSM or masses noted. Msk:  Symmetrical without gross deformities. Normal posture. Extremities:  Without edema. Neurologic:  Alert and  oriented x4 Psych:  Alert and cooperative. Normal mood and affect.  ASSESSMENT: DEVON KINGDON is a 79 y.o. female presenting today with history of IDA due to chronic blood loss (AVMs), history of constipation now resolved, history of weight loss now improving.  She has had imaging studies including CT that initially was concerning for pancreatic lesion, but MRI was negative. She now states she had been grieving the impending loss of her friend, who has since passed. Encouragingly, she has gained 5 lbs  since last visit, has good appetite, no early satiety, no abdominal pain. Constipation has resolved and no longer needs Amitiza. Continues on Prevacid indefinitely.    PLAN:   Continue Prevacid daily  Call if further weight loss, otherwise, return in 3 months for close follow-up  Annitta Needs, PhD, ANP-BC [facility]

## 2019-12-20 ENCOUNTER — Inpatient Hospital Stay (HOSPITAL_BASED_OUTPATIENT_CLINIC_OR_DEPARTMENT_OTHER): Payer: PPO | Admitting: Nurse Practitioner

## 2019-12-20 DIAGNOSIS — D509 Iron deficiency anemia, unspecified: Secondary | ICD-10-CM | POA: Diagnosis not present

## 2019-12-20 NOTE — Progress Notes (Signed)
[facility] Follow up:    Nancy Lei, MD [address] [place] [address]   DIAGNOSIS: Microcytic anemia   CURRENT THERAPY: Oral iron supplementation  INTERVAL HISTORY: Nancy Liu 79 y.o. female was called for a telephone visit for microcytic anemia.  Patient reports she has plenty of energy.  She denies any bright red bleeding per rectum or melena.  She denies easy bruising or bleeding. Denies any nausea, vomiting, or diarrhea. Denies any new pains. Had not noticed any recent bleeding such as epistaxis, hematuria or hematochezia. Denies recent chest pain on exertion, shortness of breath on minimal exertion, pre-syncopal episodes, or palpitations. Denies any numbness or tingling in hands or feet. Denies any recent fevers, infections, or recent hospitalizations. Patient reports appetite at 75% and energy level at 75%.  She is eating well maintain her weight this time.    Patient Active Problem List   Diagnosis Date Noted  . Loss of weight 07/13/2019  . Cyst, dermoid, arm, left 03/26/2018  . Diarrhea 08/10/2017  . Constipation 02/10/2017  . AVM (arteriovenous malformation) of small bowel, acquired 02/10/2017  . Iron deficiency anemia due to chronic blood loss 07/23/2016  . Gastritis and gastroduodenitis   . Symptomatic anemia 02/29/2016  . Weight loss 04/01/2012  . Microcytic anemia 01/31/2010  . Cerebrovascular disease 01/31/2010  . Arteriosclerotic cardiovascular disease (ASCVD) 01/03/2009  . HYPERLIPIDEMIA 10/22/2007  . Essential hypertension 10/22/2007    is allergic to protonix [pantoprazole sodium] and shellfish allergy.  MEDICAL HISTORY: Past Medical History:  Diagnosis Date  . Arteriosclerotic cardiovascular disease (ASCVD) 2001   Bare-metal stent placed in the right coronary artery in 12/01; residual 50% lesion of the first diagonal and mid LAD  . Cerebrovascular disease   . Cyst, dermoid, arm, left 03/26/2018  . Diabetes  mellitus    excellent control with a low-dose of a single oral agent  . GERD (gastroesophageal reflux disease)    with ulcers  . History of right coronary artery stent placement 2000  . Hyperlipidemia   . Hypertension   . Thalassemia minor   . Tobacco abuse, in remission    35 pack years; Quit in [place]: Past Surgical History:  Procedure Laterality Date  . COLONOSCOPY  2006   Dr. Collene Mares: normal  . COLONOSCOPY  2000   Dr. Everlean Cherry: normal   . COLONOSCOPY N/A 03/03/2016   Dr. Oneida Alar: diverticulosis, non-bleeding hemorrhoids, redundant left colon.  Marland Kitchen DILATION AND CURETTAGE OF UTERUS    . ESOPHAGOGASTRODUODENOSCOPY  2000   Dr. Everlean Cherry: gastritis   . ESOPHAGOGASTRODUODENOSCOPY  2006   Dr. Collene Mares: small hiatal hernia, gastritis, negative H.pylori   . ESOPHAGOGASTRODUODENOSCOPY N/A 03/03/2016   Procedure: ESOPHAGOGASTRODUODENOSCOPY (EGD);  Surgeon: Danie Binder, MD;  [facility];  Service: Endoscopy;  Laterality: N/A;  . ESOPHAGOGASTRODUODENOSCOPY N/A 06/03/2016   Dr. Oneida Alar: nonaggressive gastritis due to aspirin use. Previous ulcers had healed.  Marland Kitchen EXCISION OF KELOID Left 03/26/2018   Procedure: EXCISION OF LEFT ARM MASS;  Surgeon: Fanny Skates, MD;  Location: [facility];  Service: General;  Laterality: Left;  . INGUINAL HERNIA REPAIR  1970s   Right  . VAGINAL HYSTERECTOMY  1980   Unilateral oophorectomy    SOCIAL HISTORY: Social History   Socioeconomic History  . Marital status: Divorced    Spouse name: Not on file  . Number of children: 3  . Years of education: Not on file  .  Highest education level: Not on file  Occupational History  . Occupation: Retired    Comment: [employer] work  Tobacco Use  . Smoking status: Former Smoker    Packs/day: 1.00    Years: 18.00    Pack years: 18.00    Types: Cigarettes    Start date: 06/02/1965    Quit date: 06/09/1980    Years since quitting: 39.5  . Smokeless tobacco: Never Used  Vaping Use  .  Vaping Use: Never used  Substance and Sexual Activity  . Alcohol use: No    Alcohol/week: 0.0 standard drinks  . Drug use: No  . Sexual activity: Never  Other Topics Concern  . Not on file  Social History Narrative   Lives with daughter    Social Determinants of Health   Financial Resource Strain:   . Difficulty of Paying Living Expenses:   Food Insecurity:   . Worried About Charity fundraiser in the Last Year:   . Arboriculturist in the Last Year:   Transportation Needs:   . Film/video editor (Medical):   Marland Kitchen Lack of Transportation (Non-Medical):   Physical Activity:   . Days of Exercise per Week:   . Minutes of Exercise per Session:   Stress:   . Feeling of Stress :   Social Connections:   . Frequency of Communication with Friends and Family:   . Frequency of Social Gatherings with Friends and Family:   . Attends Religious Services:   . Active Member of Clubs or Organizations:   . Attends Archivist Meetings:   Marland Kitchen Marital Status:   Intimate Partner Violence:   . Fear of Current or Ex-Partner:   . Emotionally Abused:   Marland Kitchen Physically Abused:   . Sexually Abused:     FAMILY HISTORY: Family History  Problem Relation Age of Onset  . Diabetes Sister   . Heart disease Mother        also hypertension and asthma  . Cancer Mother   . Colon cancer Neg Hx   . Colon polyps Neg Hx     Review of Systems  All other systems reviewed and are negative.   Vital signs: -Deferred due to telephone visit  Physical Exam -Deferred due to telephone visit -Patient was alert and oriented over the phone and in no acute distress next  LABORATORY DATA:  CBC    Component Value Date/Time   WBC 6.7 12/13/2019 1020   RBC 5.13 (H) 12/13/2019 1020   HGB 12.6 12/13/2019 1020   HCT 40.8 12/13/2019 1020   PLT 262 12/13/2019 1020   MCV 79.5 (L) 12/13/2019 1020   MCH 24.6 (L) 12/13/2019 1020   MCHC 30.9 12/13/2019 1020   RDW 14.9 12/13/2019 1020   LYMPHSABS 1.2  12/13/2019 1020   MONOABS 0.5 12/13/2019 1020   EOSABS 0.3 12/13/2019 1020   BASOSABS 0.0 12/13/2019 1020    CMP     Component Value Date/Time   NA 139 12/13/2019 1020   K 3.8 12/13/2019 1020   CL 103 12/13/2019 1020   CO2 26 12/13/2019 1020   GLUCOSE 100 (H) 12/13/2019 1020   BUN 15 12/13/2019 1020   CREATININE 1.15 (H) 12/13/2019 1020   CALCIUM 9.1 12/13/2019 1020   PROT 6.9 12/13/2019 1020   ALBUMIN 4.1 12/13/2019 1020   AST 18 12/13/2019 1020   ALT 14 12/13/2019 1020   ALKPHOS 55 12/13/2019 1020   BILITOT 0.6 12/13/2019 1020   GFRNONAA 46 (L)  12/13/2019 1020   GFRAA 53 (L) 12/13/2019 1020   All questions were answered to patient's stated satisfaction. Encouraged patient to call with any new concerns or questions before his next visit to the cancer center and we can certain see him sooner, if needed.     ASSESSMENT and THERAPY PLAN:   Microcytic anemia 1.  Microcytic anemia: - This is felt secondary to thalassemia minor.  Patient has no M spike. - She received Feraheme on 12/08/2016. - She denies any bright red bleeding per rectum or melena. -Patient states she is feeling well she has plenty of energy.   - Patient reports she does not take her multivitamin with iron daily.  She will get iron pills separate from her vitamins and take them daily. -Labs done on 12/13/2019 showed her hemoglobin 12.6, ferritin 21, percent saturation 15 -She will start taking oral iron tablets once daily. -We will see her back in 6 months with repeat labs.  2.  Vitamin D deficiency: -Labs on 02/14/2019 showed her vitamin D level at 15.9. - I have prescribed vitamin D 50,000 units weekly. -Labs on 12/13/2019 showed her vitamin D level 118 -She was told when she finishes her prescription she may start back the over-the-counter vitamin D daily. -We will recheck her labs at her next visit.   Orders Placed This Encounter  Procedures  . Lactate dehydrogenase    Standing Status:   Future     Standing Expiration Date:   12/19/2020  . CBC with Differential/Platelet    Standing Status:   Future    Standing Expiration Date:   12/19/2020  . Comprehensive metabolic panel    Standing Status:   Future    Standing Expiration Date:   12/19/2020  . Ferritin    Standing Status:   Future    Standing Expiration Date:   12/19/2020  . Iron and TIBC    Standing Status:   Future    Standing Expiration Date:   12/19/2020  . Vitamin B12    Standing Status:   Future    Standing Expiration Date:   12/19/2020  . VITAMIN D 25 Hydroxy (Vit-D Deficiency, Fractures)    Standing Status:   Future    Standing Expiration Date:   12/19/2020  . Folate    Standing Status:   Future    Standing Expiration Date:   12/19/2020    All questions were answered. The patient knows to call the clinic with any problems, questions or concerns. We can certainly see the patient much sooner if necessary. This note was electronically signed.  I provided 29 minutes of non face-to-face telephone visit time during this encounter, and > 50% was spent counseling as documented under my assessment & plan.   Glennie Isle, NP-C 12/20/2019

## 2019-12-20 NOTE — Assessment & Plan Note (Signed)
1.  Microcytic anemia: - This is felt secondary to thalassemia minor.  Patient has no M spike. - She received Feraheme on 12/08/2016. - She denies any bright red bleeding per rectum or melena. -Patient states she is feeling well she has plenty of energy.   - Patient reports she does not take her multivitamin with iron daily.  She will get iron pills separate from her vitamins and take them daily. -Labs done on 12/13/2019 showed her hemoglobin 12.6, ferritin 21, percent saturation 15 -She will start taking oral iron tablets once daily. -We will see her back in 6 months with repeat labs.  2.  Vitamin D deficiency: -Labs on 02/14/2019 showed her vitamin D level at 15.9. - I have prescribed vitamin D 50,000 units weekly. -Labs on 12/13/2019 showed her vitamin D level 118 -She was told when she finishes her prescription she may start back the over-the-counter vitamin D daily. -We will recheck her labs at her next visit.

## 2019-12-30 DIAGNOSIS — E119 Type 2 diabetes mellitus without complications: Secondary | ICD-10-CM | POA: Diagnosis not present

## 2019-12-30 DIAGNOSIS — K219 Gastro-esophageal reflux disease without esophagitis: Secondary | ICD-10-CM | POA: Diagnosis not present

## 2019-12-30 DIAGNOSIS — I10 Essential (primary) hypertension: Secondary | ICD-10-CM | POA: Diagnosis not present

## 2019-12-30 DIAGNOSIS — E7849 Other hyperlipidemia: Secondary | ICD-10-CM | POA: Diagnosis not present

## 2020-01-04 ENCOUNTER — Telehealth: Payer: Self-pay

## 2020-01-04 MED ORDER — ROSUVASTATIN CALCIUM 20 MG PO TABS: 20.0000 mg | ORAL_TABLET | Freq: Every day | ORAL | 3 refills | Status: DC

## 2020-01-04 MED ORDER — CARVEDILOL 6.25 MG PO TABS: 6.2500 mg | ORAL_TABLET | Freq: Two times a day (BID) | ORAL | 3 refills | Status: DC

## 2020-01-04 MED ORDER — AMLODIPINE BESYLATE 10 MG PO TABS: 10.0000 mg | ORAL_TABLET | Freq: Every day | ORAL | 3 refills | Status: DC

## 2020-01-04 NOTE — Telephone Encounter (Signed)
New Message   Fax [phone]   *STAT* If patient is at the pharmacy, call can be transferred to refill team.   1. Which medications need to be refilled? (please list name of each medication and dose if known) amLODipine (NORVASC) 10 MG tablet rosuvastatin (CRESTOR) 20 MG tablet carvedilol (COREG) 6.25 MG tablet   2. Which pharmacy/location (including street and city if local pharmacy) is medication to be sent to? [facility]   3. Do they need a 30 day or 90 day supply? [place]

## 2020-01-04 NOTE — Telephone Encounter (Signed)
Refills complete 

## 2020-01-10 DIAGNOSIS — I119 Hypertensive heart disease without heart failure: Secondary | ICD-10-CM | POA: Diagnosis not present

## 2020-01-10 DIAGNOSIS — E1165 Type 2 diabetes mellitus with hyperglycemia: Secondary | ICD-10-CM | POA: Diagnosis not present

## 2020-01-10 DIAGNOSIS — E782 Mixed hyperlipidemia: Secondary | ICD-10-CM | POA: Diagnosis not present

## 2020-01-10 DIAGNOSIS — E559 Vitamin D deficiency, unspecified: Secondary | ICD-10-CM | POA: Diagnosis not present

## 2020-01-10 DIAGNOSIS — K219 Gastro-esophageal reflux disease without esophagitis: Secondary | ICD-10-CM | POA: Diagnosis not present

## 2020-01-10 DIAGNOSIS — Z0000 Encounter for general adult medical examination without abnormal findings: Secondary | ICD-10-CM | POA: Diagnosis not present

## 2020-01-31 DIAGNOSIS — E7849 Other hyperlipidemia: Secondary | ICD-10-CM | POA: Diagnosis not present

## 2020-01-31 DIAGNOSIS — E119 Type 2 diabetes mellitus without complications: Secondary | ICD-10-CM | POA: Diagnosis not present

## 2020-01-31 DIAGNOSIS — I10 Essential (primary) hypertension: Secondary | ICD-10-CM | POA: Diagnosis not present

## 2020-01-31 DIAGNOSIS — K219 Gastro-esophageal reflux disease without esophagitis: Secondary | ICD-10-CM | POA: Diagnosis not present

## 2020-02-15 ENCOUNTER — Other Ambulatory Visit: Payer: Self-pay | Admitting: Gastroenterology

## 2020-02-15 ENCOUNTER — Telehealth: Payer: Self-pay | Admitting: Internal Medicine

## 2020-02-15 NOTE — Telephone Encounter (Signed)
Patient said that she was starting to lose weight and wanted to speak with Roseanne Kaufman, NP. Please call [phone]

## 2020-02-15 NOTE — Telephone Encounter (Signed)
FYI Spoke with pt. Pt was seen 715/21. Pts weight was 157. Today pts weight is 151. Pt reports that she gets tired quicker and pts bowels are dark. Pt does take iron pills 65 mg daily with vitamin D weekly. Pts bowels have been moving several times weekly. Pt wants to come in sooner than the 04/2020 apt. Pt was scheduled for tomorrow at 9:00 AM with Roseanne Kaufman, NP.

## 2020-02-15 NOTE — Telephone Encounter (Signed)
Noted. Agree.

## 2020-02-16 ENCOUNTER — Other Ambulatory Visit: Payer: Self-pay

## 2020-02-16 ENCOUNTER — Encounter: Payer: Self-pay | Admitting: Gastroenterology

## 2020-02-16 ENCOUNTER — Encounter: Payer: Self-pay | Admitting: *Deleted

## 2020-02-16 ENCOUNTER — Ambulatory Visit: Payer: PPO | Admitting: Gastroenterology

## 2020-02-16 ENCOUNTER — Other Ambulatory Visit (HOSPITAL_COMMUNITY)
Admission: RE | Admit: 2020-02-16 | Discharge: 2020-02-16 | Disposition: A | Discharge status: Home / Self Care | Payer: PPO | Source: Ambulatory Visit | Attending: Gastroenterology | Admitting: Gastroenterology

## 2020-02-16 VITALS — BP 111/71 | HR 70 | Temp 97.1°F | Ht 62.5 in | Wt 154.2 lb

## 2020-02-16 DIAGNOSIS — R634 Abnormal weight loss: Secondary | ICD-10-CM

## 2020-02-16 DIAGNOSIS — D500 Iron deficiency anemia secondary to blood loss (chronic): Secondary | ICD-10-CM | POA: Insufficient documentation

## 2020-02-16 LAB — CBC WITH DIFFERENTIAL/PLATELET
Abs Immature Granulocytes: 0.04 10*3/uL (ref 0.00–0.07)
Basophils Absolute: 0 10*3/uL (ref 0.0–0.1)
Basophils Relative: 0 %
Eosinophils Absolute: 0.2 10*3/uL (ref 0.0–0.5)
Eosinophils Relative: 2 %
HCT: 40.4 % (ref 36.0–46.0)
Hemoglobin: 12.3 g/dL (ref 12.0–15.0)
Immature Granulocytes: 1 %
Lymphocytes Relative: 14 %
Lymphs Abs: 1.2 10*3/uL (ref 0.7–4.0)
MCH: 24.5 pg — ABNORMAL LOW (ref 26.0–34.0)
MCHC: 30.4 g/dL (ref 30.0–36.0)
MCV: 80.5 fL (ref 80.0–100.0)
Monocytes Absolute: 0.5 10*3/uL (ref 0.1–1.0)
Monocytes Relative: 6 %
Neutro Abs: 6.5 10*3/uL (ref 1.7–7.7)
Neutrophils Relative %: 77 %
Platelets: 269 10*3/uL (ref 150–400)
RBC: 5.02 MIL/uL (ref 3.87–5.11)
RDW: 16.4 % — ABNORMAL HIGH (ref 11.5–15.5)
WBC: 8.5 10*3/uL (ref 4.0–10.5)
nRBC: 0 % (ref 0.0–0.2)

## 2020-02-16 LAB — FERRITIN: Ferritin: 37 ng/mL (ref 11–307)

## 2020-02-16 LAB — IRON AND TIBC
Iron: 58 ug/dL (ref 28–170)
Saturation Ratios: 16 % (ref 10.4–31.8)
TIBC: 353 ug/dL (ref 250–450)
UIBC: 295 ug/dL

## 2020-02-16 NOTE — H&P (View-Only) (Signed)
Referring Provider: Lucianne Lei, MD Primary Care Physician:  Lucianne Lei, MD Primary GI: Dr. Abbey Chatters  Chief Complaint  Patient presents with  . Weight Loss    patient reports she was 163lbs and now is 154.2lbs. reports he has loss of appetite.  . Melena    stools are black    HPI:   Nancy Liu is a 79 y.o. female presenting today with a history of  IDA, EGD and colonoscopy in October 2017 revealed many non-bleeding gastric ulcers, gastroduodenitis (no H. 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 Diverticulosis and non-bleeding hemorrhoids. Repeat EGD in January 2018 showederosivegastritis butulcers had healed.Chronic constipation. Continues on aspirin as benefits outweigh the risk. Per Dr. Oneida Alar, no indication for capsule study at that time; we have been following serially. Chronic constipation. Weight loss since Sept 2020 noted.   Baseline weight in the low 160s for past few years, and in 2018 was in the 170s. At last visit in July, she was 157 and had gained weight from prior visit. Presents today at 154. On iron for about a week or 2, but stool was black prior to this.   States she is seeing intermittent black stool, which is worrisome to her. Drinks smoothies prepared by her daughter, adding Miralax as needed. This has helped manage constipation. No prescriptive agents needed. Taking Prevacid daily. No dysphagia. No NSAIDs other than baby aspirin daily. No pain with eating. Poor appetite. Early satiety. Feels tired. No N/V. No hematochezia. Initially when she started losing weight, she had changed her diet. Now, she is trying to eat but still losing. She does note that she has lost close family members and her best friend passed a few months ago. She has been grieving this. Good family support system.    As of note: She has had imaging studies including CT (Feb 2021) that initially was concerning for pancreatic lesion, but MRI was negative (Feb 2021).    Past Medical History:  Diagnosis Date  . Arteriosclerotic cardiovascular disease (ASCVD) 2001   Bare-metal stent placed in the right coronary artery in 12/01; residual 50% lesion of the first diagonal and mid LAD  . Cerebrovascular disease   . Cyst, dermoid, arm, left 03/26/2018  . Diabetes mellitus    excellent control with a low-dose of a single oral agent  . GERD (gastroesophageal reflux disease)    with ulcers  . History of right coronary artery stent placement 2000  . Hyperlipidemia   . Hypertension   . Thalassemia minor   . Tobacco abuse, in remission    35 pack years; Quit in 1980    Past Surgical History:  Procedure Laterality Date  . COLONOSCOPY  2006   Dr. Collene Mares: normal  . COLONOSCOPY  2000   Dr. Everlean Cherry: normal   . COLONOSCOPY N/A 03/03/2016   Dr. Oneida Alar: diverticulosis, non-bleeding hemorrhoids, redundant left colon.  Marland Kitchen DILATION AND CURETTAGE OF UTERUS    . ESOPHAGOGASTRODUODENOSCOPY  2000   Dr. Everlean Cherry: gastritis   . ESOPHAGOGASTRODUODENOSCOPY  2006   Dr. Collene Mares: small hiatal hernia, gastritis, negative H.pylori   . ESOPHAGOGASTRODUODENOSCOPY N/A 03/03/2016   Procedure: ESOPHAGOGASTRODUODENOSCOPY (EGD);  Surgeon: Danie Binder, MD;  [facility];  Service: Endoscopy;  Laterality: N/A;  . ESOPHAGOGASTRODUODENOSCOPY N/A 06/03/2016   Dr. Oneida Alar: nonaggressive gastritis due to aspirin use. Previous ulcers had healed.  Marland Kitchen EXCISION OF KELOID Left 03/26/2018   Procedure: EXCISION OF LEFT ARM MASS;  Surgeon: Fanny Skates, MD;  Location:  [facility];  Service: General;  Laterality: Left;  . INGUINAL HERNIA REPAIR  1970s   Right  . VAGINAL HYSTERECTOMY  1980   Unilateral oophorectomy    Current Outpatient Medications  Medication Sig Dispense Refill  . amLODipine (NORVASC) 10 MG tablet Take 1 tablet (10 mg total) by mouth daily. 90 tablet 3  . aspirin 81 MG tablet Take 81 mg by mouth daily.      Marland Kitchen augmented betamethasone dipropionate  (DIPROLENE-AF) 0.05 % ointment Apply topically as needed.     . carvedilol (COREG) 6.25 MG tablet Take 1 tablet (6.25 mg total) by mouth 2 (two) times daily. 180 tablet 3  . ergocalciferol (VITAMIN D2) 1.25 MG (50000 UT) capsule Take 1 capsule (50,000 Units total) by mouth once a week. 16 capsule 3  . ferrous sulfate 325 (65 FE) MG tablet Take 325 mg by mouth. 3 times per week    . Hypromellose (ISOPTO TEARS) 0.5 % SOLN Place 1 drop into both eyes 3 (three) times daily as needed (for dry eyes).    . irbesartan-hydrochlorothiazide (AVALIDE) 300-12.5 MG tablet Take 1 tablet by mouth daily.     . lansoprazole (PREVACID) 30 MG capsule TAKE ONE CAPSULE BY MOUTH BEFORE BREAKFAST 30 capsule 11  . Latanoprostene Bunod (VYZULTA) 0.024 % SOLN Place 1 drop into both eyes daily.    . metFORMIN (GLUCOPHAGE) 500 MG tablet Take 500 mg by mouth daily with breakfast.    . Multiple Vitamin (MULTIVITAMIN) tablet Take 1 tablet by mouth as needed.     . rosuvastatin (CRESTOR) 20 MG tablet Take 1 tablet (20 mg total) by mouth daily. 90 tablet 3  . POLY-IRON 150 FORTE 150-25-1 MG-MCG-MG CAPS Take 1 capsule by mouth once daily (Patient not taking: Reported on 02/16/2020) 90 capsule 6   No current facility-administered medications for this visit.    Allergies as of 02/16/2020 - Review Complete 02/16/2020  Allergen Reaction Noted  . Protonix [pantoprazole sodium]  03/27/2016  . Shellfish allergy  02/18/2011    Family History  Problem Relation Age of Onset  . Diabetes Sister   . Heart disease Mother        also hypertension and asthma  . Cancer Mother   . Colon cancer Neg Hx   . Colon polyps Neg Hx     Social History   Socioeconomic History  . Marital status: Divorced    Spouse name: Not on file  . Number of children: 3  . Years of education: Not on file  . Highest education level: Not on file  Occupational History  . Occupation: Retired    Comment: [employer] work  Tobacco Use  . Smoking status: Former  Smoker    Packs/day: 1.00    Years: 18.00    Pack years: 18.00    Types: Cigarettes    Start date: 06/02/1965    Quit date: 06/09/1980    Years since quitting: 39.7  . Smokeless tobacco: Never Used  Vaping Use  . Vaping Use: Never used  Substance and Sexual Activity  . Alcohol use: No    Alcohol/week: 0.0 standard drinks  . Drug use: No  . Sexual activity: Never  Other Topics Concern  . Not on file  Social History Narrative   Lives with daughter    Social Determinants of Health   Financial Resource Strain:   . Difficulty of Paying Living Expenses: Not on file  Food Insecurity:   . Worried About Charity fundraiser  in the Last Year: Not on file  . Ran Out of Food in the Last Year: Not on file  Transportation Needs:   . Lack of Transportation (Medical): Not on file  . Lack of Transportation (Non-Medical): Not on file  Physical Activity:   . Days of Exercise per Week: Not on file  . Minutes of Exercise per Session: Not on file  Stress:   . Feeling of Stress : Not on file  Social Connections:   . Frequency of Communication with Friends and Family: Not on file  . Frequency of Social Gatherings with Friends and Family: Not on file  . Attends Religious Services: Not on file  . Active Member of Clubs or Organizations: Not on file  . Attends Archivist Meetings: Not on file  . Marital Status: Not on file    Review of Systems: Gen: see HPI CV: Denies chest pain, palpitations, syncope, peripheral edema, and claudication. Resp: Denies dyspnea at rest, cough, wheezing, coughing up blood, and pleurisy. GI:see HPI Derm: Denies rash, itching, dry skin Psych: Denies depression, anxiety, memory loss, confusion. No homicidal or suicidal ideation.  Heme: see HPI  Physical Exam: BP 111/71   Pulse 70   Temp (!) 97.1 F (36.2 C) (Oral)   Ht 5' 2.5" (1.588 m)   Wt 154 lb 3.2 oz (69.9 kg)   BMI 27.75 kg/m  General:   Alert and oriented. No distress noted. Pleasant and  cooperative.  Head:  Normocephalic and atraumatic. Eyes:  Conjuctiva clear without scleral icterus. Mouth:  Mask in place Lungs: clear bilaterally Cardiac: S1 S2 present with soft systolic murmur Abdomen:  +BS, soft, non-tender and non-distended. No rebound or guarding. No HSM or masses noted. Msk:  Symmetrical without gross deformities. Normal posture. Extremities:  Without edema. Neurologic:  Alert and  oriented x4 Psych:  Alert and cooperative. Normal mood and affect.  ASSESSMENT: Nancy Liu is a delightful 79 y.o. female presenting today with history of IDA likely due to AVMs, prior history of PUD in 2017 and surveillance EGD 2018 with healed ulcers but erosive gastritis, colonoscopy in 2017, returning with reports of black stool and persistent weight loss.  Although she is on iron, she has noted intermittent black stool prior to starting this. Continues on 81 mg aspirin and PPI, as benefits for aspirin outweigh risks. No prior capsule study, as previously it was recommended to monitor clinically.   With her persistent weight loss, early satiety, lack of appetite, recommend at a minimum EGD. I am rechecking her blood work today including iron studies. May need to update colonoscopy at time of EGD to exclude occult right-sided lesion, which could also result in melena. No hematochezia reported. Capsule study should be pursued if evaluation negative; I suspect she does have small bowel source as chronic IDA but need to rule out anything that has evolved over the past few years in light of her presentation.  Encouragingly, she does have imaging from CT in Feb 2021 (due to weight loss) that was questionable for pancreatic fullness and cyst, but MRI was negative. Chronic mesenteric ischemia as culprit for weight loss is unlikely, as she does not note classic symptoms. I do note that she has had several deaths of family members and best friend, endorsing grieving, which could affect her  appetite. However, need to exclude GI source.    PLAN:  CBC, iron studies today  Will need at minimum EGD, possible colonoscopy with Dr. Abbey Chatters in near future. I  am awaiting blood work from today.  Will need to hold iron X 7 days prior.   Continue Prevacid daily  Further recommendations to follow  Annitta Needs, PhD, ANP-BC [facility]

## 2020-02-16 NOTE — Progress Notes (Signed)
Referring Provider: Lucianne Lei, MD Primary Care Physician:  Lucianne Lei, MD Primary GI: Dr. Abbey Chatters  Chief Complaint  Patient presents with  . Weight Loss    patient reports she was 163lbs and now is 154.2lbs. reports he has loss of appetite.  . Melena    stools are black    HPI:   Nancy Liu is a 79 y.o. female presenting today with a history of  IDA, EGD and colonoscopy in October 2017 revealed many non-bleeding gastric ulcers, gastroduodenitis (no H. 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 Diverticulosis and non-bleeding hemorrhoids. Repeat EGD in January 2018 showederosivegastritis butulcers had healed.Chronic constipation. Continues on aspirin as benefits outweigh the risk. Per Dr. Oneida Alar, no indication for capsule study at that time; we have been following serially. Chronic constipation. Weight loss since Sept 2020 noted.   Baseline weight in the low 160s for past few years, and in 2018 was in the 170s. At last visit in July, she was 157 and had gained weight from prior visit. Presents today at 154. On iron for about a week or 2, but stool was black prior to this.   States she is seeing intermittent black stool, which is worrisome to her. Drinks smoothies prepared by her daughter, adding Miralax as needed. This has helped manage constipation. No prescriptive agents needed. Taking Prevacid daily. No dysphagia. No NSAIDs other than baby aspirin daily. No pain with eating. Poor appetite. Early satiety. Feels tired. No N/V. No hematochezia. Initially when she started losing weight, she had changed her diet. Now, she is trying to eat but still losing. She does note that she has lost close family members and her best friend passed a few months ago. She has been grieving this. Good family support system.    As of note: She has had imaging studies including CT (Feb 2021) that initially was concerning for pancreatic lesion, but MRI was negative (Feb 2021).    Past Medical History:  Diagnosis Date  . Arteriosclerotic cardiovascular disease (ASCVD) 2001   Bare-metal stent placed in the right coronary artery in 12/01; residual 50% lesion of the first diagonal and mid LAD  . Cerebrovascular disease   . Cyst, dermoid, arm, left 03/26/2018  . Diabetes mellitus    excellent control with a low-dose of a single oral agent  . GERD (gastroesophageal reflux disease)    with ulcers  . History of right coronary artery stent placement 2000  . Hyperlipidemia   . Hypertension   . Thalassemia minor   . Tobacco abuse, in remission    35 pack years; Quit in 1980    Past Surgical History:  Procedure Laterality Date  . COLONOSCOPY  2006   Dr. Collene Mares: normal  . COLONOSCOPY  2000   Dr. Everlean Cherry: normal   . COLONOSCOPY N/A 03/03/2016   Dr. Oneida Alar: diverticulosis, non-bleeding hemorrhoids, redundant left colon.  Marland Kitchen DILATION AND CURETTAGE OF UTERUS    . ESOPHAGOGASTRODUODENOSCOPY  2000   Dr. Everlean Cherry: gastritis   . ESOPHAGOGASTRODUODENOSCOPY  2006   Dr. Collene Mares: small hiatal hernia, gastritis, negative H.pylori   . ESOPHAGOGASTRODUODENOSCOPY N/A 03/03/2016   Procedure: ESOPHAGOGASTRODUODENOSCOPY (EGD);  Surgeon: Danie Binder, MD;  Location: [facility];  Service: Endoscopy;  Laterality: N/A;  . ESOPHAGOGASTRODUODENOSCOPY N/A 06/03/2016   Dr. Oneida Alar: nonaggressive gastritis due to aspirin use. Previous ulcers had healed.  Marland Kitchen EXCISION OF KELOID Left 03/26/2018   Procedure: EXCISION OF LEFT ARM MASS;  Surgeon: Fanny Skates, MD;  Location:  [place];  Service: General;  Laterality: Left;  . INGUINAL HERNIA REPAIR  1970s   Right  . VAGINAL HYSTERECTOMY  1980   Unilateral oophorectomy    Current Outpatient Medications  Medication Sig Dispense Refill  . amLODipine (NORVASC) 10 MG tablet Take 1 tablet (10 mg total) by mouth daily. 90 tablet 3  . aspirin 81 MG tablet Take 81 mg by mouth daily.      Marland Kitchen augmented betamethasone dipropionate  (DIPROLENE-AF) 0.05 % ointment Apply topically as needed.     . carvedilol (COREG) 6.25 MG tablet Take 1 tablet (6.25 mg total) by mouth 2 (two) times daily. 180 tablet 3  . ergocalciferol (VITAMIN D2) 1.25 MG (50000 UT) capsule Take 1 capsule (50,000 Units total) by mouth once a week. 16 capsule 3  . ferrous sulfate 325 (65 FE) MG tablet Take 325 mg by mouth. 3 times per week    . Hypromellose (ISOPTO TEARS) 0.5 % SOLN Place 1 drop into both eyes 3 (three) times daily as needed (for dry eyes).    . irbesartan-hydrochlorothiazide (AVALIDE) 300-12.5 MG tablet Take 1 tablet by mouth daily.     . lansoprazole (PREVACID) 30 MG capsule TAKE ONE CAPSULE BY MOUTH BEFORE BREAKFAST 30 capsule 11  . Latanoprostene Bunod (VYZULTA) 0.024 % SOLN Place 1 drop into both eyes daily.    . metFORMIN (GLUCOPHAGE) 500 MG tablet Take 500 mg by mouth daily with breakfast.    . Multiple Vitamin (MULTIVITAMIN) tablet Take 1 tablet by mouth as needed.     . rosuvastatin (CRESTOR) 20 MG tablet Take 1 tablet (20 mg total) by mouth daily. 90 tablet 3  . POLY-IRON 150 FORTE 150-25-1 MG-MCG-MG CAPS Take 1 capsule by mouth once daily (Patient not taking: Reported on 02/16/2020) 90 capsule 6   No current facility-administered medications for this visit.    Allergies as of 02/16/2020 - Review Complete 02/16/2020  Allergen Reaction Noted  . Protonix [pantoprazole sodium]  03/27/2016  . Shellfish allergy  02/18/2011    Family History  Problem Relation Age of Onset  . Diabetes Sister   . Heart disease Mother        also hypertension and asthma  . Cancer Mother   . Colon cancer Neg Hx   . Colon polyps Neg Hx     Social History   Socioeconomic History  . Marital status: Divorced    Spouse name: Not on file  . Number of children: 3  . Years of education: Not on file  . Highest education level: Not on file  Occupational History  . Occupation: Retired    Comment: [employer] work  Tobacco Use  . Smoking status: Former  Smoker    Packs/day: 1.00    Years: 18.00    Pack years: 18.00    Types: Cigarettes    Start date: 06/02/1965    Quit date: 06/09/1980    Years since quitting: 39.7  . Smokeless tobacco: Never Used  Vaping Use  . Vaping Use: Never used  Substance and Sexual Activity  . Alcohol use: No    Alcohol/week: 0.0 standard drinks  . Drug use: No  . Sexual activity: Never  Other Topics Concern  . Not on file  Social History Narrative   Lives with daughter    Social Determinants of Health   Financial Resource Strain:   . Difficulty of Paying Living Expenses: Not on file  Food Insecurity:   . Worried About Charity fundraiser  in the Last Year: Not on file  . Ran Out of Food in the Last Year: Not on file  Transportation Needs:   . Lack of Transportation (Medical): Not on file  . Lack of Transportation (Non-Medical): Not on file  Physical Activity:   . Days of Exercise per Week: Not on file  . Minutes of Exercise per Session: Not on file  Stress:   . Feeling of Stress : Not on file  Social Connections:   . Frequency of Communication with Friends and Family: Not on file  . Frequency of Social Gatherings with Friends and Family: Not on file  . Attends Religious Services: Not on file  . Active Member of Clubs or Organizations: Not on file  . Attends Archivist Meetings: Not on file  . Marital Status: Not on file    Review of Systems: Gen: see HPI CV: Denies chest pain, palpitations, syncope, peripheral edema, and claudication. Resp: Denies dyspnea at rest, cough, wheezing, coughing up blood, and pleurisy. GI:see HPI Derm: Denies rash, itching, dry skin Psych: Denies depression, anxiety, memory loss, confusion. No homicidal or suicidal ideation.  Heme: see HPI  Physical Exam: BP 111/71   Pulse 70   Temp (!) 97.1 F (36.2 C) (Oral)   Ht 5' 2.5" (1.588 m)   Wt 154 lb 3.2 oz (69.9 kg)   BMI 27.75 kg/m  General:   Alert and oriented. No distress noted. Pleasant and  cooperative.  Head:  Normocephalic and atraumatic. Eyes:  Conjuctiva clear without scleral icterus. Mouth:  Mask in place Lungs: clear bilaterally Cardiac: S1 S2 present with soft systolic murmur Abdomen:  +BS, soft, non-tender and non-distended. No rebound or guarding. No HSM or masses noted. Msk:  Symmetrical without gross deformities. Normal posture. Extremities:  Without edema. Neurologic:  Alert and  oriented x4 Psych:  Alert and cooperative. Normal mood and affect.  ASSESSMENT: ELDINE RENCHER is a delightful 79 y.o. female presenting today with history of IDA likely due to AVMs, prior history of PUD in 2017 and surveillance EGD 2018 with healed ulcers but erosive gastritis, colonoscopy in 2017, returning with reports of black stool and persistent weight loss.  Although she is on iron, she has noted intermittent black stool prior to starting this. Continues on 81 mg aspirin and PPI, as benefits for aspirin outweigh risks. No prior capsule study, as previously it was recommended to monitor clinically.   With her persistent weight loss, early satiety, lack of appetite, recommend at a minimum EGD. I am rechecking her blood work today including iron studies. May need to update colonoscopy at time of EGD to exclude occult right-sided lesion, which could also result in melena. No hematochezia reported. Capsule study should be pursued if evaluation negative; I suspect she does have small bowel source as chronic IDA but need to rule out anything that has evolved over the past few years in light of her presentation.  Encouragingly, she does have imaging from CT in Feb 2021 (due to weight loss) that was questionable for pancreatic fullness and cyst, but MRI was negative. Chronic mesenteric ischemia as culprit for weight loss is unlikely, as she does not note classic symptoms. I do note that she has had several deaths of family members and best friend, endorsing grieving, which could affect her  appetite. However, need to exclude GI source.    PLAN:  CBC, iron studies today  Will need at minimum EGD, possible colonoscopy with Dr. Abbey Chatters in near future. I  am awaiting blood work from today.  Will need to hold iron X 7 days prior.   Continue Prevacid daily  Further recommendations to follow  Annitta Needs, PhD, ANP-BC [facility]

## 2020-02-16 NOTE — Patient Instructions (Signed)
Please have blood work done today.  You will definitely need an upper endoscopy. I am waiting to see blood work before deciding on colonoscopy at the same time.  Further recommendations to follow!  I enjoyed seeing you again today! As you know, I value our relationship and want to provide genuine, compassionate, and quality care. I welcome your feedback. If you receive a survey regarding your visit,  I greatly appreciate you taking time to fill this out. See you next time!  Annitta Needs, PhD, ANP-BC [facility]

## 2020-02-17 ENCOUNTER — Telehealth: Payer: Self-pay | Admitting: *Deleted

## 2020-02-17 NOTE — Telephone Encounter (Signed)
Called pt and made aware of appt for pre-op appt details.

## 2020-02-17 NOTE — Telephone Encounter (Signed)
Called pt. Dr. Abbey Chatters had cancellation for 9/28 at 2:15pm. She was agreeable to up. Discussed instructions. Aware to stop iron 9/21. Called endo and LMOVM making aware of appt change. Pt also aware will call back with pre-op/covid tedt appt.

## 2020-02-21 DIAGNOSIS — H401131 Primary open-angle glaucoma, bilateral, mild stage: Secondary | ICD-10-CM | POA: Diagnosis not present

## 2020-02-21 NOTE — Patient Instructions (Signed)
Nancy Liu  02/21/2020     @PREFPERIOPPHARMACY @   Your procedure is scheduled on  02/28/2020.  Report to [facility] at  1245  P.M.  Call this number if you have problems the morning of surgery:  [phone]   Remember:  Follow the diet instructions given to you by the office.                      Take these medicines the morning of surgery with A SIP OF WATER  Carvedilol, prevacid.    Do not wear jewelry, make-up or nail polish.  Do not wear lotions, powders, or perfumes. Please wear deodorant and brush your teeth.  Do not shave 48 hours prior to surgery.  Men may shave face and neck.  Do not bring valuables to the hospital.  [facility] is not responsible for any belongings or valuables.  Contacts, dentures or bridgework may not be worn into surgery.  Leave your suitcase in the car.  After surgery it may be brought to your room.  For patients admitted to the hospital, discharge time will be determined by your treatment team.  Patients discharged the day of surgery will not be allowed to drive home.   Name and phone number of your driver:   family Special instructions:   DO NOT smoke the morning of your procedure.  Please read over the following fact sheets that you were given. Anesthesia Post-op Instructions and Care and Recovery After Surgery       Upper Endoscopy, Adult, Care After This sheet gives you information about how to care for yourself after your procedure. Your health care provider may also give you more specific instructions. If you have problems or questions, contact your health care provider. What can I expect after the procedure? After the procedure, it is common to have:  A sore throat.  Mild stomach pain or discomfort.  Bloating.  Nausea. Follow these instructions at home:   Follow instructions from your health care provider about what to eat or drink after your procedure.  Return to your normal activities as told by your  health care provider. Ask your health care provider what activities are safe for you.  Take over-the-counter and prescription medicines only as told by your health care provider.  Do not drive for 24 hours if you were given a sedative during your procedure.  Keep all follow-up visits as told by your health care provider. This is important. Contact a health care provider if you have:  A sore throat that lasts longer than one day.  Trouble swallowing. Get help right away if:  You vomit blood or your vomit looks like coffee grounds.  You have: ? A fever. ? Bloody, black, or tarry stools. ? A severe sore throat or you cannot swallow. ? Difficulty breathing. ? Severe pain in your chest or abdomen. Summary  After the procedure, it is common to have a sore throat, mild stomach discomfort, bloating, and nausea.  Do not drive for 24 hours if you were given a sedative during the procedure.  Follow instructions from your health care provider about what to eat or drink after your procedure.  Return to your normal activities as told by your health care provider. This information is not intended to replace advice given to you by your health care provider. Make sure you discuss any questions you have with your health care provider. Document Revised: 11/10/2017 Document Reviewed:  10/19/2017 Elsevier Patient Education  [place] After These instructions provide you with information about caring for yourself after your procedure. Your health care provider may also give you more specific instructions. Your treatment has been planned according to current medical practices, but problems sometimes occur. Call your health care provider if you have any problems or questions after your procedure. What can I expect after the procedure? After your procedure, you may:  Feel sleepy for several hours.  Feel clumsy and have poor balance for several hours.  Feel  forgetful about what happened after the procedure.  Have poor judgment for several hours.  Feel nauseous or vomit.  Have a sore throat if you had a breathing tube during the procedure. Follow these instructions at home: For at least 24 hours after the procedure:      Have a responsible adult stay with you. It is important to have someone help care for you until you are awake and alert.  Rest as needed.  Do not: ? Participate in activities in which you could fall or become injured. ? Drive. ? Use heavy machinery. ? Drink alcohol. ? Take sleeping pills or medicines that cause drowsiness. ? Make important decisions or sign legal documents. ? Take care of children on your own. Eating and drinking  Follow the diet that is recommended by your health care provider.  If you vomit, drink water, juice, or soup when you can drink without vomiting.  Make sure you have little or no nausea before eating solid foods. General instructions  Take over-the-counter and prescription medicines only as told by your health care provider.  If you have sleep apnea, surgery and certain medicines can increase your risk for breathing problems. Follow instructions from your health care provider about wearing your sleep device: ? Anytime you are sleeping, including during daytime naps. ? While taking prescription pain medicines, sleeping medicines, or medicines that make you drowsy.  If you smoke, do not smoke without supervision.  Keep all follow-up visits as told by your health care provider. This is important. Contact a health care provider if:  You keep feeling nauseous or you keep vomiting.  You feel light-headed.  You develop a rash.  You have a fever. Get help right away if:  You have trouble breathing. Summary  For several hours after your procedure, you may feel sleepy and have poor judgment.  Have a responsible adult stay with you for at least 24 hours or until you are awake and  alert. This information is not intended to replace advice given to you by your health care provider. Make sure you discuss any questions you have with your health care provider. Document Revised: 08/17/2017 Document Reviewed: 09/09/2015 Elsevier Patient Education  [place].

## 2020-02-27 ENCOUNTER — Encounter (HOSPITAL_COMMUNITY)
Admission: RE | Admit: 2020-02-27 | Discharge: 2020-02-27 | Disposition: A | Discharge status: Home / Self Care | Payer: PPO | Source: Ambulatory Visit | Attending: Internal Medicine | Admitting: Internal Medicine

## 2020-02-27 ENCOUNTER — Other Ambulatory Visit: Payer: Self-pay

## 2020-02-27 ENCOUNTER — Other Ambulatory Visit (HOSPITAL_COMMUNITY)
Admission: RE | Admit: 2020-02-27 | Discharge: 2020-02-27 | Disposition: A | Discharge status: Home / Self Care | Payer: PPO | Source: Ambulatory Visit | Attending: Internal Medicine | Admitting: Internal Medicine

## 2020-02-27 DIAGNOSIS — Z01812 Encounter for preprocedural laboratory examination: Secondary | ICD-10-CM | POA: Insufficient documentation

## 2020-02-27 DIAGNOSIS — Z20822 Contact with and (suspected) exposure to covid-19: Secondary | ICD-10-CM | POA: Insufficient documentation

## 2020-02-28 ENCOUNTER — Ambulatory Visit (HOSPITAL_COMMUNITY)
Admission: RE | Admit: 2020-02-28 | Discharge: 2020-02-28 | Disposition: A | Discharge status: Home / Self Care | Payer: PPO | Attending: Internal Medicine | Admitting: Internal Medicine

## 2020-02-28 ENCOUNTER — Encounter (HOSPITAL_COMMUNITY)
Admission: RE | Disposition: A | Discharge status: Home / Self Care | Payer: Self-pay | Source: Home / Self Care | Attending: Internal Medicine

## 2020-02-28 ENCOUNTER — Ambulatory Visit (HOSPITAL_COMMUNITY): Payer: PPO | Admitting: Anesthesiology

## 2020-02-28 DIAGNOSIS — Z8719 Personal history of other diseases of the digestive system: Secondary | ICD-10-CM | POA: Insufficient documentation

## 2020-02-28 DIAGNOSIS — K5909 Other constipation: Secondary | ICD-10-CM | POA: Insufficient documentation

## 2020-02-28 DIAGNOSIS — R12 Heartburn: Secondary | ICD-10-CM | POA: Diagnosis not present

## 2020-02-28 DIAGNOSIS — Z91013 Allergy to seafood: Secondary | ICD-10-CM | POA: Insufficient documentation

## 2020-02-28 DIAGNOSIS — Z833 Family history of diabetes mellitus: Secondary | ICD-10-CM | POA: Diagnosis not present

## 2020-02-28 DIAGNOSIS — Z7982 Long term (current) use of aspirin: Secondary | ICD-10-CM | POA: Diagnosis not present

## 2020-02-28 DIAGNOSIS — Z8249 Family history of ischemic heart disease and other diseases of the circulatory system: Secondary | ICD-10-CM | POA: Insufficient documentation

## 2020-02-28 DIAGNOSIS — E119 Type 2 diabetes mellitus without complications: Secondary | ICD-10-CM | POA: Diagnosis not present

## 2020-02-28 DIAGNOSIS — E785 Hyperlipidemia, unspecified: Secondary | ICD-10-CM | POA: Diagnosis not present

## 2020-02-28 DIAGNOSIS — K219 Gastro-esophageal reflux disease without esophagitis: Secondary | ICD-10-CM | POA: Diagnosis not present

## 2020-02-28 DIAGNOSIS — K2970 Gastritis, unspecified, without bleeding: Secondary | ICD-10-CM

## 2020-02-28 DIAGNOSIS — K2950 Unspecified chronic gastritis without bleeding: Secondary | ICD-10-CM | POA: Diagnosis not present

## 2020-02-28 DIAGNOSIS — Z888 Allergy status to other drugs, medicaments and biological substances status: Secondary | ICD-10-CM | POA: Insufficient documentation

## 2020-02-28 DIAGNOSIS — I2510 Atherosclerotic heart disease of native coronary artery without angina pectoris: Secondary | ICD-10-CM | POA: Diagnosis not present

## 2020-02-28 DIAGNOSIS — Z8711 Personal history of peptic ulcer disease: Secondary | ICD-10-CM | POA: Diagnosis not present

## 2020-02-28 DIAGNOSIS — I10 Essential (primary) hypertension: Secondary | ICD-10-CM | POA: Insufficient documentation

## 2020-02-28 DIAGNOSIS — D563 Thalassemia minor: Secondary | ICD-10-CM | POA: Diagnosis not present

## 2020-02-28 DIAGNOSIS — R634 Abnormal weight loss: Secondary | ICD-10-CM | POA: Diagnosis not present

## 2020-02-28 DIAGNOSIS — Z79899 Other long term (current) drug therapy: Secondary | ICD-10-CM | POA: Insufficient documentation

## 2020-02-28 DIAGNOSIS — Z955 Presence of coronary angioplasty implant and graft: Secondary | ICD-10-CM | POA: Diagnosis not present

## 2020-02-28 DIAGNOSIS — K3189 Other diseases of stomach and duodenum: Secondary | ICD-10-CM | POA: Diagnosis not present

## 2020-02-28 DIAGNOSIS — Z7984 Long term (current) use of oral hypoglycemic drugs: Secondary | ICD-10-CM | POA: Insufficient documentation

## 2020-02-28 DIAGNOSIS — Z87891 Personal history of nicotine dependence: Secondary | ICD-10-CM | POA: Diagnosis not present

## 2020-02-28 DIAGNOSIS — K319 Disease of stomach and duodenum, unspecified: Secondary | ICD-10-CM | POA: Insufficient documentation

## 2020-02-28 DIAGNOSIS — D649 Anemia, unspecified: Secondary | ICD-10-CM | POA: Diagnosis not present

## 2020-02-28 HISTORY — PX: ESOPHAGOGASTRODUODENOSCOPY (EGD) WITH PROPOFOL: SHX5813

## 2020-02-28 HISTORY — PX: BIOPSY: SHX5522

## 2020-02-28 LAB — GLUCOSE, CAPILLARY: Glucose-Capillary: 97 mg/dL (ref 70–99)

## 2020-02-28 LAB — SARS CORONAVIRUS 2 (TAT 6-24 HRS): SARS Coronavirus 2: NEGATIVE

## 2020-02-28 SURGERY — ESOPHAGOGASTRODUODENOSCOPY (EGD) WITH PROPOFOL
Anesthesia: General

## 2020-02-28 MED ORDER — GLYCOPYRROLATE 0.2 MG/ML IJ SOLN
INTRAMUSCULAR | Status: DC | PRN
  Administered 2020-02-28: 1 mg via INTRAVENOUS

## 2020-02-28 MED ORDER — LACTATED RINGERS IV SOLN: Freq: Once | INTRAVENOUS | Status: AC

## 2020-02-28 MED ORDER — KETAMINE HCL 50 MG/5ML IJ SOSY
PREFILLED_SYRINGE | INTRAMUSCULAR | Status: AC
  Filled 2020-02-28: qty 5

## 2020-02-28 MED ORDER — PROPOFOL 10 MG/ML IV BOLUS
INTRAVENOUS | Status: DC | PRN
  Administered 2020-02-28: 20 mg via INTRAVENOUS

## 2020-02-28 MED ORDER — PROPOFOL 500 MG/50ML IV EMUL
INTRAVENOUS | Status: DC | PRN
  Administered 2020-02-28: 150 ug/kg/min via INTRAVENOUS

## 2020-02-28 MED ORDER — LACTATED RINGERS IV SOLN: INTRAVENOUS | Status: DC | PRN

## 2020-02-28 MED ORDER — LIDOCAINE HCL (CARDIAC) PF 100 MG/5ML IV SOSY
PREFILLED_SYRINGE | INTRAVENOUS | Status: DC | PRN
  Administered 2020-02-28: 50 mg via INTRATRACHEAL

## 2020-02-28 MED ORDER — LANSOPRAZOLE 30 MG PO CPDR: 30.0000 mg | DELAYED_RELEASE_CAPSULE | Freq: Two times a day (BID) | ORAL | 5 refills | Status: DC

## 2020-02-28 MED ORDER — LIDOCAINE VISCOUS HCL 2 % MT SOLN
OROMUCOSAL | Status: AC
  Filled 2020-02-28: qty 15

## 2020-02-28 MED ORDER — LIDOCAINE VISCOUS HCL 2 % MT SOLN
15.0000 mL | Freq: Once | OROMUCOSAL | Status: AC
  Administered 2020-02-28: 15 mL via OROMUCOSAL

## 2020-02-28 MED ORDER — KETAMINE HCL 10 MG/ML IJ SOLN
INTRAMUSCULAR | Status: DC | PRN
  Administered 2020-02-28: 20 mg via INTRAVENOUS

## 2020-02-28 NOTE — Op Note (Signed)
[facility] Patient Name: Nancy Liu Procedure Date: 02/28/2020 9:36 AM MRN: [id number] Date of Birth: 1941-05-02 Attending MD: Elon Alas. Edgar Frisk CSN: [id number] Age: 79 Admit Type: Outpatient Procedure:                Upper GI endoscopy Indications:              Heartburn, Weight loss Providers:                Elon Alas. Abbey Chatters, DO, Janeece Riggers, RN, Lambert Mody, Crystal Page Referring MD:              Medicines:                See the Anesthesia note for documentation of the                            administered medications Complications:            No immediate complications. Estimated Blood Loss:     Estimated blood loss was minimal. Procedure:                Pre-Anesthesia Assessment:                           - The anesthesia plan was to use monitored                            anesthesia care (MAC).                           After obtaining informed consent, the endoscope was                            passed under direct vision. Throughout the                            procedure, the patient's blood pressure, pulse, and                            oxygen saturations were monitored continuously. The                            [id number]-H190 ([id number]) scope was introduced through the                            mouth, and advanced to the second part of duodenum.                            The upper GI endoscopy was accomplished without                            difficulty. The patient tolerated the procedure                            well. Scope In: 9:49:02  AM Scope Out: 9:52:27 AM Total Procedure Duration: 0 hours 3 minutes 25 seconds  Findings:      There is no endoscopic evidence of areas of erosion, esophagitis, hiatal       hernia, inflammation, ulcerations or varices in the entire esophagus.      Localized mild inflammation characterized by erythema was found in the       gastric antrum. Biopsies were taken with a cold  forceps for Helicobacter       pylori testing.      The duodenal bulb, first portion of the duodenum and second portion of       the duodenum were normal. Impression:               - Gastritis. Biopsied.                           - Normal duodenal bulb, first portion of the                            duodenum and second portion of the duodenum. Moderate Sedation:      Per Anesthesia Care Recommendation:           - Patient has a contact number available for                            emergencies. The signs and symptoms of potential                            delayed complications were discussed with the                            patient. Return to normal activities tomorrow.                            Written discharge instructions were provided to the                            patient.                           - Resume previous diet.                           - Continue present medications.                           - Await pathology results.                           - Return to GI clinic in 6 weeks with Roseanne Kaufman.                           - Use Prevacid (lansoprazole) 30 mg PO BID for 8                            weeks. Procedure Code(s):        --- Professional ---  52589, Esophagogastroduodenoscopy, flexible,                            transoral; with biopsy, single or multiple Diagnosis Code(s):        --- Professional ---                           K29.70, Gastritis, unspecified, without bleeding                           R12, Heartburn                           R63.4, Abnormal weight loss CPT copyright 2019 American Medical Association. All rights reserved. The codes documented in this report are preliminary and upon coder review may  be revised to meet current compliance requirements. Elon Alas. Abbey Chatters, DO [place] Banyan Goodchild, DO 02/28/2020 10:00:50 AM This report has been signed electronically. Number of Addenda: 0

## 2020-02-28 NOTE — Discharge Instructions (Signed)
EGD Discharge instructions Please read the instructions outlined below and refer to this sheet in the next few weeks. These discharge instructions provide you with general information on caring for yourself after you leave the hospital. Your doctor may also give you specific instructions. While your treatment has been planned according to the most current medical practices available, unavoidable complications occasionally occur. If you have any problems or questions after discharge, please call your doctor. ACTIVITY  You may resume your regular activity but move at a slower pace for the next 24 hours.   Take frequent rest periods for the next 24 hours.   Walking will help expel (get rid of) the air and reduce the bloated feeling in your abdomen.   No driving for 24 hours (because of the anesthesia (medicine) used during the test).   You may shower.   Do not sign any important legal documents or operate any machinery for 24 hours (because of the anesthesia used during the test).  NUTRITION  Drink plenty of fluids.   You may resume your normal diet.   Begin with a light meal and progress to your normal diet.   Avoid alcoholic beverages for 24 hours or as instructed by your caregiver.  MEDICATIONS  You may resume your normal medications unless your caregiver tells you otherwise.  WHAT YOU CAN EXPECT TODAY  You may experience abdominal discomfort such as a feeling of fullness or "gas" pains.  FOLLOW-UP  Your doctor will discuss the results of your test with you.  SEEK IMMEDIATE MEDICAL ATTENTION IF ANY OF THE FOLLOWING OCCUR:  Excessive nausea (feeling sick to your stomach) and/or vomiting.   Severe abdominal pain and distention (swelling).   Trouble swallowing.   Temperature over 101 F (37.8 C).   Rectal bleeding or vomiting of blood.   Your EGD showed inflammation in your stomach.  I biopsied this to rule out infection called H. pylori.  I should have these results back  within a week or so.  In the meantime, I will increase your Prevacid to 30 mg twice daily.  I have sent a new prescription to your pharmacy.  Stay on this dose for 8 weeks and then decrease back down to once daily.  Avoid NSAIDs as best you can.  Follow-up with GI with Roseanne Kaufman in 6 to 8 weeks or sooner if needed.  I hope you have a great rest of your week!  Elon Alas. Abbey Chatters, D.O. Gastroenterology and Hepatology [facility]

## 2020-02-28 NOTE — Anesthesia Preprocedure Evaluation (Signed)
Anesthesia Evaluation  Patient identified by MRN, date of birth, ID band Patient awake    Reviewed: Allergy & Precautions, NPO status , Patient's Chart, lab work & pertinent test results, reviewed documented beta blocker date and time   History of Anesthesia Complications Negative for: history of anesthetic complications  Airway Mallampati: II  TM Distance: >3 FB Neck ROM: Full    Dental  (+) Dental Advisory Given, Missing   Pulmonary former smoker,    breath sounds clear to auscultation       Cardiovascular Exercise Tolerance: Good hypertension, Pt. on medications and Pt. on home beta blockers Normal cardiovascular exam Rhythm:Regular Rate:Normal     Neuro/Psych negative neurological ROS  negative psych ROS   GI/Hepatic GERD  ,  Endo/Other  diabetes, Well Controlled, Type 2, Oral Hypoglycemic Agents  Renal/GU      Musculoskeletal   Abdominal   Peds  Hematology  (+) anemia ,   Anesthesia Other Findings   Reproductive/Obstetrics                            Anesthesia Physical Anesthesia Plan  ASA: II  Anesthesia Plan: General   Post-op Pain Management:    Induction: Intravenous  PONV Risk Score and Plan:   Airway Management Planned: Nasal Cannula and Natural Airway  Additional Equipment:   Intra-op Plan:   Post-operative Plan:   Informed Consent: I have reviewed the patients History and Physical, chart, labs and discussed the procedure including the risks, benefits and alternatives for the proposed anesthesia with the patient or authorized representative who has indicated his/her understanding and acceptance.     Dental advisory given  Plan Discussed with: CRNA and Surgeon  Anesthesia Plan Comments:         Anesthesia Quick Evaluation

## 2020-02-28 NOTE — Anesthesia Postprocedure Evaluation (Signed)
Anesthesia Post Note  Patient: MAKAYLIE DEDEAUX  Procedure(s) Performed: ESOPHAGOGASTRODUODENOSCOPY (EGD) WITH PROPOFOL (N/A ) BIOPSY  Patient location during evaluation: PACU Anesthesia Type: General Level of consciousness: awake Pain management: pain level controlled Vital Signs Assessment: post-procedure vital signs reviewed and stable Respiratory status: spontaneous breathing Cardiovascular status: stable Postop Assessment: no apparent nausea or vomiting Anesthetic complications: no   No complications documented.   Last Vitals:  Vitals:   02/28/20 0905 02/28/20 0915  BP: 126/78   Pulse: 73 72  Resp: 15 18  Temp: 37.1 C   SpO2: 97% 98%    Last Pain:  Vitals:   02/28/20 0946  PainSc: 0-No pain                 Shilee Biggs Hristova

## 2020-02-28 NOTE — Interval H&P Note (Signed)
History and Physical Interval Note:  02/28/2020 8:57 AM  Nancy Liu  has presented today for surgery, with the diagnosis of early satiety, weight loss, IDA, possible melena.  The various methods of treatment have been discussed with the patient and family. After consideration of risks, benefits and other options for treatment, the patient has consented to  Procedure(s) with comments: ESOPHAGOGASTRODUODENOSCOPY (EGD) WITH PROPOFOL (N/A) - 9:30am, per office pt moving up to 9/28 @ 2:15 as a surgical intervention.  The patient's history has been reviewed, patient examined, no change in status, stable for surgery.  I have reviewed the patient's chart and labs.  Questions were answered to the patient's satisfaction.     Eloise Harman

## 2020-02-28 NOTE — Transfer of Care (Signed)
Immediate Anesthesia Transfer of Care Note  Patient: Nancy Liu  Procedure(s) Performed: ESOPHAGOGASTRODUODENOSCOPY (EGD) WITH PROPOFOL (N/A ) BIOPSY  Patient Location: PACU  Anesthesia Type:General  Level of Consciousness: awake  Airway & Oxygen Therapy: Patient Spontanous Breathing  Post-op Assessment: Report given to RN and Post -op Vital signs reviewed and stable  Post vital signs: Reviewed and stable  Last Vitals:  Vitals Value Taken Time  BP    Temp    Pulse 72 02/28/20 1000  Resp 19 02/28/20 1000  SpO2 96 % 02/28/20 1000  Vitals shown include unvalidated device data.  Last Pain:  Vitals:   02/28/20 0946  PainSc: 0-No pain         Complications: No complications documented.

## 2020-03-01 ENCOUNTER — Other Ambulatory Visit: Payer: Self-pay

## 2020-03-01 DIAGNOSIS — E119 Type 2 diabetes mellitus without complications: Secondary | ICD-10-CM | POA: Diagnosis not present

## 2020-03-01 DIAGNOSIS — K219 Gastro-esophageal reflux disease without esophagitis: Secondary | ICD-10-CM | POA: Diagnosis not present

## 2020-03-01 DIAGNOSIS — I10 Essential (primary) hypertension: Secondary | ICD-10-CM | POA: Diagnosis not present

## 2020-03-01 DIAGNOSIS — E7849 Other hyperlipidemia: Secondary | ICD-10-CM | POA: Diagnosis not present

## 2020-03-01 LAB — SURGICAL PATHOLOGY

## 2020-03-02 ENCOUNTER — Telehealth: Payer: Self-pay | Admitting: Cardiology

## 2020-03-02 NOTE — Telephone Encounter (Signed)
I spoke with patient.She says she does not feel like she is going to pass out, she feels dizzy.She also reports she is feeling better now and that she has an apt with her PCP tomorrow. I encouraged her to go to the ED if symptoms worsen, she agrees.

## 2020-03-02 NOTE — Telephone Encounter (Signed)
Pt states she feels like she will pass out and want Nurse advice   Please call [phone]    Thanks renee

## 2020-03-03 DIAGNOSIS — E559 Vitamin D deficiency, unspecified: Secondary | ICD-10-CM | POA: Diagnosis not present

## 2020-03-03 DIAGNOSIS — K219 Gastro-esophageal reflux disease without esophagitis: Secondary | ICD-10-CM | POA: Diagnosis not present

## 2020-03-03 DIAGNOSIS — I119 Hypertensive heart disease without heart failure: Secondary | ICD-10-CM | POA: Diagnosis not present

## 2020-03-03 DIAGNOSIS — E1169 Type 2 diabetes mellitus with other specified complication: Secondary | ICD-10-CM | POA: Diagnosis not present

## 2020-03-03 DIAGNOSIS — I110 Hypertensive heart disease with heart failure: Secondary | ICD-10-CM | POA: Diagnosis not present

## 2020-03-03 DIAGNOSIS — Z23 Encounter for immunization: Secondary | ICD-10-CM | POA: Diagnosis not present

## 2020-03-03 DIAGNOSIS — R634 Abnormal weight loss: Secondary | ICD-10-CM | POA: Diagnosis not present

## 2020-03-03 DIAGNOSIS — D649 Anemia, unspecified: Secondary | ICD-10-CM | POA: Diagnosis not present

## 2020-03-03 DIAGNOSIS — E1165 Type 2 diabetes mellitus with hyperglycemia: Secondary | ICD-10-CM | POA: Diagnosis not present

## 2020-03-05 ENCOUNTER — Encounter (HOSPITAL_COMMUNITY): Payer: Self-pay | Admitting: Internal Medicine

## 2020-03-14 ENCOUNTER — Encounter: Payer: Self-pay | Admitting: Gastroenterology

## 2020-03-14 ENCOUNTER — Ambulatory Visit: Payer: PPO | Admitting: Gastroenterology

## 2020-03-14 ENCOUNTER — Other Ambulatory Visit: Payer: Self-pay

## 2020-03-14 VITALS — BP 135/84 | HR 72 | Temp 97.6°F | Ht 63.0 in | Wt 151.4 lb

## 2020-03-14 DIAGNOSIS — K2970 Gastritis, unspecified, without bleeding: Secondary | ICD-10-CM

## 2020-03-14 DIAGNOSIS — K2990 Gastroduodenitis, unspecified, without bleeding: Secondary | ICD-10-CM

## 2020-03-14 DIAGNOSIS — R634 Abnormal weight loss: Secondary | ICD-10-CM | POA: Diagnosis not present

## 2020-03-14 MED ORDER — AMITRIPTYLINE HCL 10 MG PO TABS: ORAL_TABLET | ORAL | 1 refills | Status: DC

## 2020-03-14 NOTE — Patient Instructions (Signed)
1. Continue lansoprazole 30mg  twice daily, 30 minutes before a meal. 2. Start elavil 10mg  at bedtime. If tolerated, after 7 days, increase to 20mg  at bedtime.  3. Try to avoid fatty foods which are hard to digest. Eat snacks or small meals every 2 to 3 hours rather than a large meal during the day. Take your time eating and chew your food thoroughly. 4. Return to the office in six weeks.

## 2020-03-14 NOTE — Progress Notes (Signed)
Primary Care Physician: Lucianne Lei, MD  Primary Gastroenterologist:  Elon Alas. Abbey Chatters, DO   Chief Complaint  Patient presents with  . Anemia    f/u  . Bloated    w/ or w/o eating    HPI: Nancy Liu is a 79 y.o. 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  Patient had an EGD and colonoscopy in October 2017 revealed many nonbleeding gastric ulcers, gastroduodenitis (no H. pylori), nonbleeding AVM in the second portion of the duodenum.  Diverticulosis and nonbleeding hemorrhoids.  Repeat EGD in January 2018 showed erosive gastritis with ulcers had healed.  Since her last office visit she had another EGD on February 28, 2020 that showed focal inflammation in the gastric antrum consistent with gastritis on biopsies.  No H. pylori.  Her Prevacid was increased to 30 mg twice daily.  CT in February 2021 with questionable fullness in the pancreatic head.  6 mm cyst in the pancreatic body.  MRCP negative for mass or ductal dilation.  Appetite picked up some in couple of weeks.  She has been on lansoprazole 30 mg twice daily for a couple of weeks now.  She complains of bloating in the upper abdomen a couple of times per week.  No nausea or vomiting.  Saw PCP last week for dizziness and found to have positive orthostatics.  She relates she is drinking plenty of fluid.  Also getting protein in smoothies.  States she was advised by her PCP to take her time getting up to allow her blood pressure to equilibrate before ambulating.  She denies any melena or rectal bleeding.  She continues to have some early satiety.     Wt Readings from Last 3 Encounters:  03/14/20 151 lb 6.4 oz (68.7 kg)  02/16/20 154 lb 3.2 oz (69.9 kg)  12/15/19 157 lb 6.4 oz (71.4 kg)  April 2021: 160 pounds   Current Outpatient Medications  Medication Sig Dispense Refill  . amLODipine (NORVASC) 10 MG tablet Take 1 tablet (10 mg total) by mouth daily.  (Patient taking differently: Take 10 mg by mouth at bedtime. ) 90 tablet 3  . aspirin EC 81 MG tablet Take 81 mg by mouth daily. Swallow whole.    . augmented betamethasone dipropionate (DIPROLENE-AF) 0.05 % ointment Apply 1 application topically 2 (two) times daily as needed (skin itching/dryness).     . carvedilol (COREG) 6.25 MG tablet Take 1 tablet (6.25 mg total) by mouth 2 (two) times daily. 180 tablet 3  . ergocalciferol (VITAMIN D2) 1.25 MG (50000 UT) capsule Take 1 capsule (50,000 Units total) by mouth once a week. (Patient taking differently: Take 50,000 Units by mouth every Tuesday. ) 16 capsule 3  . ferrous sulfate 325 (65 FE) MG tablet Take 325 mg by mouth daily.     . Hypromellose (ISOPTO TEARS) 0.5 % SOLN Place 1 drop into both eyes 3 (three) times daily as needed (for dry eyes).    . irbesartan-hydrochlorothiazide (AVALIDE) 300-12.5 MG tablet Take 1 tablet by mouth daily.     . lansoprazole (PREVACID) 30 MG capsule Take 1 capsule (30 mg total) by mouth 2 (two) times daily before a meal. 30 capsule 5  . Latanoprostene Bunod (VYZULTA) 0.024 % SOLN Place 1 drop into both eyes See admin instructions. Instill 1 drop into both eyes 3 times a week at bedtime.    . metFORMIN (GLUCOPHAGE) 1000 MG tablet Take 1,000 mg  by mouth daily.     . Multiple Vitamins-Minerals (MULTI ADULT GUMMIES PO) Take 1 tablet by mouth daily. Centrum Multi-Gummies    . rosuvastatin (CRESTOR) 20 MG tablet Take 1 tablet (20 mg total) by mouth daily. (Patient taking differently: Take 20 mg by mouth every evening. ) 90 tablet 3   No current facility-administered medications for this visit.    Allergies as of 03/14/2020 - Review Complete 03/14/2020  Allergen Reaction Noted  . Protonix [pantoprazole sodium]  03/27/2016  . Shellfish allergy  02/18/2011    ROS:  General: Negative for  fever, chills, fatigue, weakness. See hpi ENT: Negative for hoarseness, difficulty swallowing , nasal congestion. CV: Negative for  chest pain, angina, palpitations, dyspnea on exertion, peripheral edema.  Respiratory: Negative for dyspnea at rest, dyspnea on exertion, cough, sputum, wheezing.  GI: See history of present illness. GU:  Negative for dysuria, hematuria, urinary incontinence, urinary frequency, nocturnal urination.  Endo: see hpi   Physical Examination:   BP 135/84   Pulse 72   Temp 97.6 F (36.4 C) (Oral)   Ht 5\' 3"  (1.6 m)   Wt 151 lb 6.4 oz (68.7 kg)   BMI 26.82 kg/m   General: Well-nourished, well-developed in no acute distress. Somewhat anxious Eyes: No icterus. Mouth: masked Lungs: Clear to auscultation bilaterally.  Heart: Regular rate and rhythm, no murmurs rubs or gallops.  Abdomen: Bowel sounds are normal, nontender, nondistended, no hepatosplenomegaly or masses, no abdominal bruits or hernia , no rebound or guarding.   Extremities: No lower extremity edema. No clubbing or deformities. Neuro: Alert and oriented x 4   Skin: Warm and dry, no jaundice.   Psych: Alert and cooperative, normal mood and affect.  Labs:  Lab Results  Component Value Date   CREATININE 1.15 (H) 12/13/2019   BUN 15 12/13/2019   NA 139 12/13/2019   K 3.8 12/13/2019   CL 103 12/13/2019   CO2 26 12/13/2019   Lab Results  Component Value Date   ALT 14 12/13/2019   AST 18 12/13/2019   ALKPHOS 55 12/13/2019   BILITOT 0.6 12/13/2019   Lab Results  Component Value Date   WBC 8.5 02/16/2020   HGB 12.3 02/16/2020   HCT 40.4 02/16/2020   MCV 80.5 02/16/2020   PLT 269 02/16/2020   Lab Results  Component Value Date   IRON 58 02/16/2020   TIBC 353 02/16/2020   FERRITIN 37 02/16/2020   Lab Results  Component Value Date   TSH 0.410 07/14/2019      Imaging Studies: No results found.   Impression/plan:  Pleasant 79 year old female presenting for follow-up of upper abdominal bloating, early satiety.  She was concerned so she moved her appointment up sooner than originally scheduled.  Extensive  evaluation as outlined above.  Recent EGD with gastritis but no ulcers.  Since increasing Prevacid to twice daily she has noted some increase in her appetite.  She has lost 3 more pounds in the last month.  She notes she is not sleeping well.  States she is very stressed out with the pandemic.  She has had multiple friends and family who has died over the past year from various causes.  Of note her recent labs indicated normal hemoglobin and iron.  Unlikely that her gastritis explains significant weight loss.  Cannot rule out underlying gastroparesis given her early satiety and chronic diabetes.  Recommend several small meals daily, avoid fatty foods which are harder to digest.  Continue lansoprazole 30 mg  twice daily before meals.   I suspect significant psychosocial overlay and I have requested that she speak with her PCP about her significant anxiety since the pandemic.  We discussed trying a low dose tricyclic antidepressant to help with appetite, dyspepsia, potentially aiding in sleep.  Started her on Elavil 10 mg at bedtime, increase to 20 mg at bedtime if tolerated after 1 week.  She will see her PCP as scheduled in the next couple of weeks.  We will have her come back to see [place] in 6 weeks.

## 2020-03-26 ENCOUNTER — Encounter: Payer: Self-pay | Admitting: Cardiology

## 2020-03-26 ENCOUNTER — Ambulatory Visit (INDEPENDENT_AMBULATORY_CARE_PROVIDER_SITE_OTHER): Payer: PPO | Admitting: Cardiology

## 2020-03-26 ENCOUNTER — Other Ambulatory Visit: Payer: Self-pay

## 2020-03-26 VITALS — BP 116/68 | HR 81 | Ht 63.0 in | Wt 149.2 lb

## 2020-03-26 DIAGNOSIS — R0789 Other chest pain: Secondary | ICD-10-CM

## 2020-03-26 DIAGNOSIS — E785 Hyperlipidemia, unspecified: Secondary | ICD-10-CM

## 2020-03-26 DIAGNOSIS — Z9861 Coronary angioplasty status: Secondary | ICD-10-CM

## 2020-03-26 DIAGNOSIS — R079 Chest pain, unspecified: Secondary | ICD-10-CM | POA: Diagnosis not present

## 2020-03-26 DIAGNOSIS — I10 Essential (primary) hypertension: Secondary | ICD-10-CM

## 2020-03-26 DIAGNOSIS — I2510 Atherosclerotic heart disease of native coronary artery without angina pectoris: Secondary | ICD-10-CM

## 2020-03-26 DIAGNOSIS — E119 Type 2 diabetes mellitus without complications: Secondary | ICD-10-CM | POA: Diagnosis not present

## 2020-03-26 MED ORDER — ISOSORBIDE MONONITRATE ER 30 MG PO TB24: 30.0000 mg | ORAL_TABLET | Freq: Every day | ORAL | 5 refills | Status: DC

## 2020-03-26 MED ORDER — NITROGLYCERIN 0.4 MG SL SUBL: 0.4000 mg | SUBLINGUAL_TABLET | SUBLINGUAL | 3 refills | Status: DC | PRN

## 2020-03-26 NOTE — Assessment & Plan Note (Signed)
On Metformin 

## 2020-03-26 NOTE — Assessment & Plan Note (Signed)
Controlled.  

## 2020-03-26 NOTE — Assessment & Plan Note (Signed)
Bare-metal stent placed in the right coronary artery in 12/01; residual 50% lesion of the first diagonal and mid LAD. Last functional study was nuclear stress 2012

## 2020-03-26 NOTE — Assessment & Plan Note (Signed)
Symptoms for the last 4-6 weeks- c/w exertional angina relieved with rest.

## 2020-03-26 NOTE — Assessment & Plan Note (Signed)
On statin rx-PCP follows

## 2020-03-26 NOTE — Patient Instructions (Signed)
Medication Instructions:  Your physician has recommended you make the following change in your medication:   1) Start Imdur 30 mg, 1 tablet by mouth once a day 2) Start Nitroglycerin 0.4 mg, 1 tablet under the tongue as needed for chest pain  *If you need a refill on your cardiac medications before your next appointment, please call your pharmacy*  Lab Work: None ordered today  Testing/Procedures: None ordered today  Follow-Up: At [facility], you and your health needs are our priority.  As part of our continuing mission to provide you with exceptional heart care, we have created designated Provider Care Teams.  These Care Teams include your primary Cardiologist (physician) and Advanced Practice Providers (APPs -  Physician Assistants and Nurse Practitioners) who all work together to provide you with the care you need, when you need it.  Your next appointment:   As soon as possible  The format for your next appointment:   In Person  Provider:   Carlyle Dolly, MD

## 2020-03-26 NOTE — Progress Notes (Addendum)
Cardiology Office Note:    Date:  03/26/2020   ID:  Nancy, Liu January 03, 1941, MRN [id number]  PCP:  Lucianne Lei, MD  Cardiologist:  Carlyle Dolly, MD  Electrophysiologist:  None   Referring MD: Lucianne Lei, MD   CC: exertional chest tightness  History of Present Illness:    Nancy Liu is a 79 y.o. female with a hx of remote PCI with an RCA BMS in 2001.  Her last functional study was a Myoview in 2012 which was low risk.  Echocardiogram 2014 showed preserved LV function.  The patient has treated hypertension, treated dyslipidemia, and noninsulin-dependent diabetes.  She presents to the office today for follow-up.  She complained to me of exertional chest tightness which she has noted over the last 4 to 6 weeks.  She has attributed this to stress.  The patient tells me she has exertional "tightness" while walking or carrying things.  She gives a positive Levine sign when describing this.  She denies any associated nausea vomiting diaphoresis or shortness of breath.  She denies any radiation to her jaw or back.  She says her symptoms are relieved with rest, she has not tried nitroglycerin.  Past Medical History:  Diagnosis Date  . Arteriosclerotic cardiovascular disease (ASCVD) 2001   Bare-metal stent placed in the right coronary artery in 12/01; residual 50% lesion of the first diagonal and mid LAD  . Cerebrovascular disease   . Cyst, dermoid, arm, left 03/26/2018  . Diabetes mellitus    excellent control with a low-dose of a single oral agent  . GERD (gastroesophageal reflux disease)    with ulcers  . History of right coronary artery stent placement 2000  . Hyperlipidemia   . Hypertension   . Thalassemia minor   . Tobacco abuse, in remission    35 pack years; Quit in 1980    Past Surgical History:  Procedure Laterality Date  . BIOPSY  02/28/2020   Procedure: BIOPSY;  Surgeon: Eloise Harman, DO;  [facility];  Service: Endoscopy;;  .  COLONOSCOPY  2006   Dr. Collene Mares: normal  . COLONOSCOPY  2000   Dr. Everlean Cherry: normal   . COLONOSCOPY N/A 03/03/2016   Dr. Oneida Alar: diverticulosis, non-bleeding hemorrhoids, redundant left colon.  Marland Kitchen DILATION AND CURETTAGE OF UTERUS    . ESOPHAGOGASTRODUODENOSCOPY  2000   Dr. Everlean Cherry: gastritis   . ESOPHAGOGASTRODUODENOSCOPY  2006   Dr. Collene Mares: small hiatal hernia, gastritis, negative H.pylori   . ESOPHAGOGASTRODUODENOSCOPY N/A 03/03/2016   Procedure: ESOPHAGOGASTRODUODENOSCOPY (EGD);  Surgeon: Danie Binder, MD;  Location: [facility];  Service: Endoscopy;  Laterality: N/A;  . ESOPHAGOGASTRODUODENOSCOPY N/A 06/03/2016   Dr. Oneida Alar: nonaggressive gastritis due to aspirin use. Previous ulcers had healed.  . ESOPHAGOGASTRODUODENOSCOPY (EGD) WITH PROPOFOL N/A 02/28/2020   Procedure: ESOPHAGOGASTRODUODENOSCOPY (EGD) WITH PROPOFOL;  Surgeon: Eloise Harman, DO;  Location: [facility];  Service: Endoscopy;  Laterality: N/A;  9:30am, per office pt moving up to 9/28 @ 2:15  . EXCISION OF KELOID Left 03/26/2018   Procedure: EXCISION OF LEFT ARM MASS;  Surgeon: Fanny Skates, MD;  Location: [place];  Service: General;  Laterality: Left;  . INGUINAL HERNIA REPAIR  1970s   Right  . VAGINAL HYSTERECTOMY  1980   Unilateral oophorectomy    Current Medications: Current Meds  Medication Sig  . amitriptyline (ELAVIL) 10 MG tablet Start with 10mg  at bedtime for 7 days. If tolerated, then increase to 20mg  at bedtime.  Marland Kitchen  amLODipine (NORVASC) 10 MG tablet Take 1 tablet (10 mg total) by mouth daily.  Marland Kitchen aspirin EC 81 MG tablet Take 81 mg by mouth daily. Swallow whole.  . augmented betamethasone dipropionate (DIPROLENE-AF) 0.05 % ointment Apply 1 application topically 2 (two) times daily as needed (skin itching/dryness).   . carvedilol (COREG) 6.25 MG tablet Take 1 tablet (6.25 mg total) by mouth 2 (two) times daily.  . ergocalciferol (VITAMIN D2) 1.25 MG (50000 UT) capsule Take 1 capsule (50,000  Units total) by mouth once a week.  . ferrous sulfate 325 (65 FE) MG tablet Take 325 mg by mouth daily.   . Hypromellose (ISOPTO TEARS) 0.5 % SOLN Place 1 drop into both eyes 3 (three) times daily as needed (for dry eyes).  . irbesartan-hydrochlorothiazide (AVALIDE) 300-12.5 MG tablet Take 1 tablet by mouth daily.   . lansoprazole (PREVACID) 30 MG capsule Take 1 capsule (30 mg total) by mouth 2 (two) times daily before a meal.  . Latanoprostene Bunod (VYZULTA) 0.024 % SOLN Place 1 drop into both eyes See admin instructions. Instill 1 drop into both eyes 3 times a week at bedtime.  . metFORMIN (GLUCOPHAGE) 1000 MG tablet Take 500 mg by mouth daily.   . Multiple Vitamins-Minerals (MULTI ADULT GUMMIES PO) Take 1 tablet by mouth daily. Centrum Multi-Gummies  . rosuvastatin (CRESTOR) 20 MG tablet Take 1 tablet (20 mg total) by mouth daily.     Allergies:   Protonix [pantoprazole sodium] and Shellfish allergy   Social History   Socioeconomic History  . Marital status: Divorced    Spouse name: Not on file  . Number of children: 3  . Years of education: Not on file  . Highest education level: Not on file  Occupational History  . Occupation: Retired    Comment: [employer] work  Tobacco Use  . Smoking status: Former Smoker    Packs/day: 1.00    Years: 18.00    Pack years: 18.00    Types: Cigarettes    Start date: 06/02/1965    Quit date: 06/09/1980    Years since quitting: 39.8  . Smokeless tobacco: Never Used  Vaping Use  . Vaping Use: Never used  Substance and Sexual Activity  . Alcohol use: No    Alcohol/week: 0.0 standard drinks  . Drug use: No  . Sexual activity: Never  Other Topics Concern  . Not on file  Social History Narrative   Lives with daughter    Social Determinants of Health   Financial Resource Strain:   . Difficulty of Paying Living Expenses: Not on file  Food Insecurity:   . Worried About Charity fundraiser in the Last Year: Not on file  . Ran Out of Food in the  Last Year: Not on file  Transportation Needs:   . Lack of Transportation (Medical): Not on file  . Lack of Transportation (Non-Medical): Not on file  Physical Activity:   . Days of Exercise per Week: Not on file  . Minutes of Exercise per Session: Not on file  Stress:   . Feeling of Stress : Not on file  Social Connections:   . Frequency of Communication with Friends and Family: Not on file  . Frequency of Social Gatherings with Friends and Family: Not on file  . Attends Religious Services: Not on file  . Active Member of Clubs or Organizations: Not on file  . Attends Archivist Meetings: Not on file  . Marital Status: Not on file  Family History: The patient's family history includes Cancer in her mother; Diabetes in her sister; Heart disease in her mother. There is no history of Colon cancer or Colon polyps.  ROS:   Please see the history of present illness.     All other systems reviewed and are negative.  EKGs/Labs/Other Studies Reviewed:    The following studies were reviewed today: Myoview 2012-   EKG:  EKG is ordered today.  The ekg ordered today demonstrates NSR, HR 81  Recent Labs: 07/14/2019: TSH 0.410 12/13/2019: ALT 14; BUN 15; Creatinine, Ser 1.15; Potassium 3.8; Sodium 139 02/16/2020: Hemoglobin 12.3; Platelets 269  Recent Lipid Panel    Component Value Date/Time   CHOL 134 08/01/2010 1731   TRIG 132 08/01/2010 1731   HDL 40 08/01/2010 1731   CHOLHDL 3.4 Ratio 08/01/2010 1731   VLDL 26 08/01/2010 1731   LDLCALC 68 08/01/2010 1731   LDLCALC 81 01/21/2010 0000    Physical Exam:    VS:  BP 116/68   Pulse 81   Ht 5\' 3"  (1.6 m)   Wt 149 lb 3.2 oz (67.7 kg)   SpO2 97%   BMI 26.43 kg/m     Wt Readings from Last 3 Encounters:  03/26/20 149 lb 3.2 oz (67.7 kg)  03/14/20 151 lb 6.4 oz (68.7 kg)  02/16/20 154 lb 3.2 oz (69.9 kg)     GEN: Well nourished, well developed AA female in no acute distress HEENT: Normal NECK: No JVD; No carotid  bruits CARDIAC: RRR, no murmurs, rubs, gallops RESPIRATORY:  Clear to auscultation without rales, wheezing or rhonchi  ABDOMEN: Soft, non-tender, non-distended MUSCULOSKELETAL:  No edema; No deformity  SKIN: Warm and dry NEUROLOGIC:  Alert and oriented x 3 PSYCHIATRIC:  Normal affect   ASSESSMENT:    Chest pain on exertion Symptoms for the last 4-6 weeks- c/w exertional angina relieved with rest.   CAD S/P percutaneous coronary angioplasty Bare-metal stent placed in the right coronary artery in 12/01; residual 50% lesion of the first diagonal and mid LAD. Last functional study was nuclear stress 2012  Essential hypertension Controlled  Non-insulin dependent type 2 diabetes mellitus ([place]) On Metformin  Dyslipidemia, goal LDL below 70 On statin rx-PCP follows  PLAN:    I suggested coronary angiogram as I feel her symptoms are classic angina.  Her symptoms so far have been relieved with rest.  Another option would be medical Rx followed by stress testing.  I added Imdur 30 mg today and gave her an Rx for NTG 0.4 mg SL PRN.  She will consider her options and let us know.  She knows to go to the ED or call 911 if she has chest pain not relived with NTG.   Medication Adjustments/Labs and Tests Ordered: Current medicines are reviewed at length with the patient today.  Concerns regarding medicines are outlined above.  Orders Placed This Encounter  Procedures  . EKG 12-Lead   Meds ordered this encounter  Medications  . isosorbide mononitrate (IMDUR) 30 MG 24 hr tablet    Sig: Take 1 tablet (30 mg total) by mouth daily.    Dispense:  30 tablet    Refill:  5  . nitroGLYCERIN (NITROSTAT) 0.4 MG SL tablet    Sig: Place 1 tablet (0.4 mg total) under the tongue every 5 (five) minutes as needed for chest pain.    Dispense:  25 tablet    Refill:  3    Patient Instructions  Medication Instructions:  Your  physician has recommended you make the following change in your medication:    1) Start Imdur 30 mg, 1 tablet by mouth once a day 2) Start Nitroglycerin 0.4 mg, 1 tablet under the tongue as needed for chest pain  *If you need a refill on your cardiac medications before your next appointment, please call your pharmacy*  Lab Work: None ordered today  Testing/Procedures: None ordered today  Follow-Up: At [facility], you and your health needs are our priority.  As part of our continuing mission to provide you with exceptional heart care, we have created designated Provider Care Teams.  These Care Teams include your primary Cardiologist (physician) and Advanced Practice Providers (APPs -  Physician Assistants and Nurse Practitioners) who all work together to provide you with the care you need, when you need it.  Your next appointment:   As soon as possible  The format for your next appointment:   In Person  Provider:   Carlyle Dolly, MD      Signed, Kerin Ransom, PA-C  03/26/2020 4:25 PM    [place]

## 2020-03-28 ENCOUNTER — Telehealth: Payer: Self-pay | Admitting: Cardiology

## 2020-03-28 NOTE — Telephone Encounter (Signed)
Patient calling back.   °

## 2020-03-28 NOTE — Telephone Encounter (Signed)
I called her to discuss the risk and benfits of cath after she called the [place] office indicating she wishes to proceed- left message to call back at the [facility] office.  Kerin Ransom PA-C 03/28/2020 1:38 PM

## 2020-03-28 NOTE — Progress Notes (Signed)
I agree, please arrange her cath  Zandra Abts MD

## 2020-03-29 ENCOUNTER — Ambulatory Visit: Payer: PPO | Admitting: Student

## 2020-03-29 ENCOUNTER — Telehealth: Payer: Self-pay | Admitting: Cardiology

## 2020-03-29 ENCOUNTER — Telehealth: Payer: Self-pay

## 2020-03-29 DIAGNOSIS — R079 Chest pain, unspecified: Secondary | ICD-10-CM

## 2020-03-29 DIAGNOSIS — I259 Chronic ischemic heart disease, unspecified: Secondary | ICD-10-CM

## 2020-03-29 NOTE — Telephone Encounter (Signed)
I spoke with Ms. Waltner this morning.  I reviewed her symptoms with her.  She is not had any recurrent exertional chest discomfort.  She has not started her Imdur yet.  After speaking with her discussing our options, coronary angiogram versus an exercise Myoview we decided to start with an exercise Myoview.  Kerin Ransom PA-C 03/29/2020 8:56 AM

## 2020-03-29 NOTE — Telephone Encounter (Signed)
Per Kerin Ransom, PA-C, STAT lexiscan tomorrow, arrive at 11 am to [facility] main entrance patient given instructions and verbalized understanding.

## 2020-03-29 NOTE — Telephone Encounter (Signed)
Will send message to [facility] PA, who called patient.

## 2020-03-30 ENCOUNTER — Ambulatory Visit (HOSPITAL_COMMUNITY)
Admission: RE | Admit: 2020-03-30 | Discharge: 2020-03-30 | Disposition: A | Discharge status: Home / Self Care | Payer: PPO | Source: Ambulatory Visit | Attending: Cardiology | Admitting: Cardiology

## 2020-03-30 ENCOUNTER — Encounter (HOSPITAL_COMMUNITY): Payer: Self-pay

## 2020-03-30 ENCOUNTER — Ambulatory Visit (HOSPITAL_BASED_OUTPATIENT_CLINIC_OR_DEPARTMENT_OTHER)
Admission: RE | Admit: 2020-03-30 | Discharge: 2020-03-30 | Disposition: A | Discharge status: Home / Self Care | Payer: PPO | Source: Ambulatory Visit | Attending: Cardiology | Admitting: Cardiology

## 2020-03-30 ENCOUNTER — Ambulatory Visit (HOSPITAL_COMMUNITY): Payer: PPO

## 2020-03-30 ENCOUNTER — Encounter (HOSPITAL_COMMUNITY): Payer: PPO

## 2020-03-30 ENCOUNTER — Other Ambulatory Visit: Payer: Self-pay

## 2020-03-30 DIAGNOSIS — I259 Chronic ischemic heart disease, unspecified: Secondary | ICD-10-CM | POA: Insufficient documentation

## 2020-03-30 LAB — NM MYOCAR MULTI W/SPECT W/WALL MOTION / EF
LV dias vol: 29 mL (ref 46–106)
LV sys vol: 6 mL
Peak HR: 105 {beats}/min
RATE: 0.38
Rest HR: 74 {beats}/min
SDS: 5
SRS: 0
SSS: 5
TID: 0.64

## 2020-03-30 MED ORDER — SODIUM CHLORIDE FLUSH 0.9 % IV SOLN
INTRAVENOUS | Status: AC
  Administered 2020-03-30: 10 mL via INTRAVENOUS
  Filled 2020-03-30: qty 10

## 2020-03-30 MED ORDER — REGADENOSON 0.4 MG/5ML IV SOLN
INTRAVENOUS | Status: AC
  Administered 2020-03-30: 0.4 mg via INTRAVENOUS
  Filled 2020-03-30: qty 5

## 2020-03-30 MED ORDER — TECHNETIUM TC 99M TETROFOSMIN IV KIT
10.0000 | PACK | Freq: Once | INTRAVENOUS | Status: AC | PRN
  Administered 2020-03-30: 10.3 via INTRAVENOUS

## 2020-03-30 MED ORDER — TECHNETIUM TC 99M TETROFOSMIN IV KIT
30.0000 | PACK | Freq: Once | INTRAVENOUS | Status: AC | PRN
  Administered 2020-03-30: 32 via INTRAVENOUS

## 2020-03-31 DIAGNOSIS — E7849 Other hyperlipidemia: Secondary | ICD-10-CM | POA: Diagnosis not present

## 2020-03-31 DIAGNOSIS — I10 Essential (primary) hypertension: Secondary | ICD-10-CM | POA: Diagnosis not present

## 2020-03-31 DIAGNOSIS — K219 Gastro-esophageal reflux disease without esophagitis: Secondary | ICD-10-CM | POA: Diagnosis not present

## 2020-03-31 DIAGNOSIS — E119 Type 2 diabetes mellitus without complications: Secondary | ICD-10-CM | POA: Diagnosis not present

## 2020-04-02 ENCOUNTER — Telehealth: Payer: Self-pay | Admitting: Cardiology

## 2020-04-02 NOTE — Telephone Encounter (Signed)
I spoke with the patient about her Myoview results and informed her that they were reassuring.  She had some problems with Imdur- fatigue.  She has a f/u later this week and will keep that.  Kerin Ransom PA-C 04/02/2020 9:54 AM

## 2020-04-03 ENCOUNTER — Ambulatory Visit: Payer: PPO | Admitting: Gastroenterology

## 2020-04-05 DIAGNOSIS — I10 Essential (primary) hypertension: Secondary | ICD-10-CM | POA: Diagnosis not present

## 2020-04-05 DIAGNOSIS — E782 Mixed hyperlipidemia: Secondary | ICD-10-CM | POA: Diagnosis not present

## 2020-04-05 DIAGNOSIS — E559 Vitamin D deficiency, unspecified: Secondary | ICD-10-CM | POA: Diagnosis not present

## 2020-04-05 DIAGNOSIS — E0800 Diabetes mellitus due to underlying condition with hyperosmolarity without nonketotic hyperglycemic-hyperosmolar coma (NKHHC): Secondary | ICD-10-CM | POA: Diagnosis not present

## 2020-04-14 DIAGNOSIS — L723 Sebaceous cyst: Secondary | ICD-10-CM | POA: Diagnosis not present

## 2020-04-14 DIAGNOSIS — E11628 Type 2 diabetes mellitus with other skin complications: Secondary | ICD-10-CM | POA: Diagnosis not present

## 2020-04-17 DIAGNOSIS — L723 Sebaceous cyst: Secondary | ICD-10-CM | POA: Diagnosis not present

## 2020-04-17 DIAGNOSIS — E11628 Type 2 diabetes mellitus with other skin complications: Secondary | ICD-10-CM | POA: Diagnosis not present

## 2020-04-22 NOTE — Progress Notes (Signed)
Cardiology Office Note    Date:  04/23/2020   ID:  Adayah, Arocho Mar 15, 1941, MRN [id number]  PCP:  Lucianne Lei, MD  Cardiologist: Carlyle Dolly, MD    Chief Complaint  Patient presents with  . Follow-up    1 month visit    History of Present Illness:    Nancy Liu is a 79 y.o. female with past medical history of CAD (s/p BMS to RCA in 2001, low-risk NST in 2012), HTN, HLD and anemia who presents to the office today for 54-month follow-up.   She was last examined by Kerin Ransom, PA-C in 03/2020 and reported episodes of exertional chest tightness for the past 4-6 weeks and symptoms were relieved with rest. A cardiac catheterization was initially recommended for further evaluation and she was started on Imdur 30mg  daily. She called the office several days later and denied any recurrent chest pain despite having not started Imdur yet. Was recommended to proceed with a Lexiscan Myoview instead. This was reassuring and showed no perfusion defects consistent with scar or ischemia and was overall a low-risk study.   In talking with the patient today, she reports overall doing well since her last office visit. She denies any repeat episodes of chest pain. She has not been taking Imdur. She reports being under increased stress at the time of her last office visit due to the passing of a family member and a close friend. Since then, she reports her stress has improved and feels like this was playing a large role in her symptoms at that time. She reports her breathing has been stable and denies any recent orthopnea, PND or lower extremity edema. No recent palpitations or dizziness.  She is looking forward to her grand-daughter and great granddaughter coming to visit from [place] for Thanksgiving.   Past Medical History:  Diagnosis Date  . Arteriosclerotic cardiovascular disease (ASCVD) 2001   Bare-metal stent placed in the right coronary artery in 12/01; residual 50% lesion of  the first diagonal and mid LAD  . Cerebrovascular disease   . Cyst, dermoid, arm, left 03/26/2018  . Diabetes mellitus    excellent control with a low-dose of a single oral agent  . GERD (gastroesophageal reflux disease)    with ulcers  . History of right coronary artery stent placement 2000  . Hyperlipidemia   . Hypertension   . Thalassemia minor   . Tobacco abuse, in remission    35 pack years; Quit in 1980    Past Surgical History:  Procedure Laterality Date  . BIOPSY  02/28/2020   Procedure: BIOPSY;  Surgeon: Eloise Harman, DO;  [facility];  Service: Endoscopy;;  . COLONOSCOPY  2006   Dr. Collene Mares: normal  . COLONOSCOPY  2000   Dr. Everlean Cherry: normal   . COLONOSCOPY N/A 03/03/2016   Dr. Oneida Alar: diverticulosis, non-bleeding hemorrhoids, redundant left colon.  Marland Kitchen DILATION AND CURETTAGE OF UTERUS    . ESOPHAGOGASTRODUODENOSCOPY  2000   Dr. Everlean Cherry: gastritis   . ESOPHAGOGASTRODUODENOSCOPY  2006   Dr. Collene Mares: small hiatal hernia, gastritis, negative H.pylori   . ESOPHAGOGASTRODUODENOSCOPY N/A 03/03/2016   Procedure: ESOPHAGOGASTRODUODENOSCOPY (EGD);  Surgeon: Danie Binder, MD;  [facility];  Service: Endoscopy;  Laterality: N/A;  . ESOPHAGOGASTRODUODENOSCOPY N/A 06/03/2016   Dr. Oneida Alar: nonaggressive gastritis due to aspirin use. Previous ulcers had healed.  . ESOPHAGOGASTRODUODENOSCOPY (EGD) WITH PROPOFOL N/A 02/28/2020   Procedure: ESOPHAGOGASTRODUODENOSCOPY (EGD) WITH PROPOFOL;  Surgeon: Eloise Harman, DO;  [facility];  Service: Endoscopy;  Laterality: N/A;  9:30am, per office pt moving up to 9/28 @ 2:15  . EXCISION OF KELOID Left 03/26/2018   Procedure: EXCISION OF LEFT ARM MASS;  Surgeon: Fanny Skates, MD;  Location: [facility];  Service: General;  Laterality: Left;  . INGUINAL HERNIA REPAIR  1970s   Right  . VAGINAL HYSTERECTOMY  1980   Unilateral oophorectomy    Current Medications: Outpatient Medications Prior to Visit   Medication Sig Dispense Refill  . amLODipine (NORVASC) 10 MG tablet Take 1 tablet (10 mg total) by mouth daily. 90 tablet 3  . aspirin EC 81 MG tablet Take 81 mg by mouth daily. Swallow whole.    . augmented betamethasone dipropionate (DIPROLENE-AF) 0.05 % ointment Apply 1 application topically 2 (two) times daily as needed (skin itching/dryness).     . carvedilol (COREG) 6.25 MG tablet Take 1 tablet (6.25 mg total) by mouth 2 (two) times daily. 180 tablet 3  . ergocalciferol (VITAMIN D2) 1.25 MG (50000 UT) capsule Take 1 capsule (50,000 Units total) by mouth once a week. 16 capsule 3  . ferrous sulfate 325 (65 FE) MG tablet Take 325 mg by mouth daily.     . irbesartan-hydrochlorothiazide (AVALIDE) 300-12.5 MG tablet Take 1 tablet by mouth daily.     . lansoprazole (PREVACID) 30 MG capsule Take 1 capsule (30 mg total) by mouth 2 (two) times daily before a meal. 30 capsule 5  . Latanoprostene Bunod (VYZULTA) 0.024 % SOLN Place 1 drop into both eyes See admin instructions. Instill 1 drop into both eyes 3 times a week at bedtime.    . metFORMIN (GLUCOPHAGE) 1000 MG tablet Take 500 mg by mouth daily.     . Multiple Vitamins-Minerals (MULTI ADULT GUMMIES PO) Take 1 tablet by mouth daily. Centrum Multi-Gummies    . nitroGLYCERIN (NITROSTAT) 0.4 MG SL tablet Place 1 tablet (0.4 mg total) under the tongue every 5 (five) minutes as needed for chest pain. 25 tablet 3  . rosuvastatin (CRESTOR) 20 MG tablet Take 1 tablet (20 mg total) by mouth daily. 90 tablet 3  . amitriptyline (ELAVIL) 10 MG tablet Start with 10mg  at bedtime for 7 days. If tolerated, then increase to 20mg  at bedtime. (Patient not taking: Reported on 04/23/2020) 60 tablet 1  . Hypromellose (ISOPTO TEARS) 0.5 % SOLN Place 1 drop into both eyes 3 (three) times daily as needed (for dry eyes). (Patient not taking: Reported on 04/23/2020)    . isosorbide mononitrate (IMDUR) 30 MG 24 hr tablet Take 1 tablet (30 mg total) by mouth daily. (Patient  not taking: Reported on 04/23/2020) 30 tablet 5   No facility-administered medications prior to visit.     Allergies:   Protonix [pantoprazole sodium] and Shellfish allergy   Social History   Socioeconomic History  . Marital status: Divorced    Spouse name: Not on file  . Number of children: 3  . Years of education: Not on file  . Highest education level: Not on file  Occupational History  . Occupation: Retired    Comment: [employer] work  Tobacco Use  . Smoking status: Former Smoker    Packs/day: 1.00    Years: 18.00    Pack years: 18.00    Types: Cigarettes    Start date: 06/02/1965    Quit date: 06/09/1980    Years since quitting: 39.8  . Smokeless tobacco: Never Used  Vaping Use  . Vaping Use: Never used  Substance and Sexual Activity  . Alcohol use: No    Alcohol/week: 0.0 standard drinks  . Drug use: No  . Sexual activity: Never  Other Topics Concern  . Not on file  Social History Narrative   Lives with daughter    Social Determinants of Health   Financial Resource Strain:   . Difficulty of Paying Living Expenses: Not on file  Food Insecurity:   . Worried About Charity fundraiser in the Last Year: Not on file  . Ran Out of Food in the Last Year: Not on file  Transportation Needs:   . Lack of Transportation (Medical): Not on file  . Lack of Transportation (Non-Medical): Not on file  Physical Activity:   . Days of Exercise per Week: Not on file  . Minutes of Exercise per Session: Not on file  Stress:   . Feeling of Stress : Not on file  Social Connections:   . Frequency of Communication with Friends and Family: Not on file  . Frequency of Social Gatherings with Friends and Family: Not on file  . Attends Religious Services: Not on file  . Active Member of Clubs or Organizations: Not on file  . Attends Archivist Meetings: Not on file  . Marital Status: Not on file     Family History:  The patient's family history includes Cancer in her mother;  Diabetes in her sister; Heart disease in her mother.   Review of Systems:   Please see the history of present illness.     General:  No chills, fever, night sweats or weight changes.  Cardiovascular:  No dyspnea on exertion, edema, orthopnea, palpitations, paroxysmal nocturnal dyspnea. Positive for chest pain (now resolved).  Dermatological: No rash, lesions/masses Respiratory: No cough, dyspnea Urologic: No hematuria, dysuria Abdominal:   No nausea, vomiting, diarrhea, bright red blood per rectum, melena, or hematemesis Neurologic:  No visual changes, wkns, changes in mental status. All other systems reviewed and are otherwise negative except as noted above.   Physical Exam:    VS:  BP 118/60   Pulse 83   Ht 5' 2.5" (1.588 m)   Wt 157 lb (71.2 kg)   SpO2 97%   BMI 28.26 kg/m    General: Well developed, well nourished,female appearing in no acute distress. Head: Normocephalic, atraumatic. Neck: No carotid bruits. JVD not elevated.  Lungs: Respirations regular and unlabored, without wheezes or rales.  Heart: Regular rate and rhythm. No S3 or S4.  No murmur, no rubs, or gallops appreciated. Abdomen: Appears non-distended. No obvious abdominal masses. Msk:  Strength and tone appear normal for age. No obvious joint deformities or effusions. Extremities: No clubbing or cyanosis. No lower extremity edema.  Distal pedal pulses are 2+ bilaterally. Neuro: Alert and oriented X 3. Moves all extremities spontaneously. No focal deficits noted. Psych:  Responds to questions appropriately with a normal affect. Skin: No rashes or lesions noted  Wt Readings from Last 3 Encounters:  04/23/20 157 lb (71.2 kg)  03/26/20 149 lb 3.2 oz (67.7 kg)  03/14/20 151 lb 6.4 oz (68.7 kg)     Studies/Labs Reviewed:   EKG:  EKG is not ordered today.    Recent Labs: 07/14/2019: TSH 0.410 12/13/2019: ALT 14; BUN 15; Creatinine, Ser 1.15; Potassium 3.8; Sodium 139 02/16/2020: Hemoglobin 12.3; Platelets 269    Lipid Panel    Component Value Date/Time   CHOL 134 08/01/2010 1731   TRIG 132 08/01/2010 1731   HDL 40 08/01/2010 1731  CHOLHDL 3.4 Ratio 08/01/2010 1731   VLDL 26 08/01/2010 1731   LDLCALC 68 08/01/2010 1731   LDLCALC 81 01/21/2010 0000    Additional studies/ records that were reviewed today include:   Echocardiogram: 03/2016 Study Conclusions   - Left ventricle: The cavity size was normal. Wall thickness was  increased in a pattern of moderate LVH. Systolic function was  normal. The estimated ejection fraction was in the range of 55%  to 60%. Wall motion was normal; there were no regional wall  motion abnormalities. Doppler parameters are consistent with  abnormal left ventricular relaxation (grade 1 diastolic  dysfunction).   NST: 03/2020  There was no ST segment deviation noted during stress.  The study is normal. There are no perfusion defects consistent with prior infarct or current ischemia.  This is a low risk study.  The left ventricular ejection fraction is hyperdynamic (>65%).  Assessment:    1. Coronary artery disease involving native coronary artery of native heart without angina pectoris   2. Essential hypertension   3. Hyperlipidemia LDL goal <70   4. Symptomatic anemia      Plan:   In order of problems listed above:  1. CAD - She is s/p BMS to RCA in 2001 and her recent stress test several weeks ago showed no evidence of ischemia or scar, overall being a low risk study. She does report being under increased stress at that time and it is likely this was playing a role in her symptoms. - Will continue with her current medication regimen for now with ASA 81 mg daily, Coreg 6.25 mg twice daily and Crestor 20 mg daily. She does have Imdur at home and is aware to start this if she does develop recurrent symptoms and to make [place] aware so closer follow-up can be arranged.  2. HTN - BP is well controlled at 118/60 during today's visit.  Continue current medication regimen with Amlodipine 10 mg daily, Coreg 6.25 mg twice daily and Irbesartan-HCTZ 300-12.5 mg daily.  3. HLD - Followed by PCP. She remains on Crestor 20 mg daily with goal LDL less than 70 in the setting of known CAD.  4. Anemia - Hemoglobin was stable at 12.3 by recent labs in 02/2020. She remains on iron supplementation.    Medication Adjustments/Labs and Tests Ordered: Current medicines are reviewed at length with the patient today.  Concerns regarding medicines are outlined above.  Medication changes, Labs and Tests ordered today are listed in the Patient Instructions below. Patient Instructions  Medication Instructions:  Your physician recommends that you continue on your current medications as directed. Please refer to the Current Medication list given to you today.  *If you need a refill on your cardiac medications before your next appointment, please call your pharmacy*   Lab Work: None today If you have labs (blood work) drawn today and your tests are completely normal, you will receive your results only by: Marland Kitchen MyChart Message (if you have MyChart) OR . A paper copy in the mail If you have any lab test that is abnormal or we need to change your treatment, we will call you to review the results.   Testing/Procedures: None today   Follow-Up: At [facility], you and your health needs are our priority.  As part of our continuing mission to provide you with exceptional heart care, we have created designated Provider Care Teams.  These Care Teams include your primary Cardiologist (physician) and Advanced Practice Providers (APPs -  Physician  Assistants and Nurse Practitioners) who all work together to provide you with the care you need, when you need it.  We recommend signing up for the patient portal called "MyChart".  Sign up information is provided on this After Visit Summary.  MyChart is used to connect with patients for Virtual Visits  (Telemedicine).  Patients are able to view lab/test results, encounter notes, upcoming appointments, etc.  Non-urgent messages can be sent to your provider as well.   To learn more about what you can do with MyChart, go to [email or web address].    Your next appointment:   6 month(s)  The format for your next appointment:   In Person  Provider:   Dr.Jonathan Branch   Other Instructions None   Thank you for choosing [facility] !          Signed, Erma Heritage, PA-C  04/23/2020 4:38 PM    [facility] [address] [place], [place] [address] Phone: [phone] Fax: [phone]

## 2020-04-23 ENCOUNTER — Ambulatory Visit: Payer: PPO | Admitting: Student

## 2020-04-23 ENCOUNTER — Telehealth: Payer: Self-pay | Admitting: Cardiology

## 2020-04-23 ENCOUNTER — Encounter: Payer: Self-pay | Admitting: Student

## 2020-04-23 ENCOUNTER — Other Ambulatory Visit: Payer: Self-pay

## 2020-04-23 VITALS — BP 118/60 | HR 83 | Ht 62.5 in | Wt 157.0 lb

## 2020-04-23 DIAGNOSIS — E785 Hyperlipidemia, unspecified: Secondary | ICD-10-CM | POA: Diagnosis not present

## 2020-04-23 DIAGNOSIS — I2510 Atherosclerotic heart disease of native coronary artery without angina pectoris: Secondary | ICD-10-CM

## 2020-04-23 DIAGNOSIS — D649 Anemia, unspecified: Secondary | ICD-10-CM | POA: Diagnosis not present

## 2020-04-23 DIAGNOSIS — I10 Essential (primary) hypertension: Secondary | ICD-10-CM | POA: Diagnosis not present

## 2020-04-23 NOTE — Telephone Encounter (Signed)
  Patient Consent for Virtual Visit         Nancy Liu has provided verbal consent on 04/23/2020 for a virtual visit (video or telephone).   CONSENT FOR VIRTUAL VISIT FOR:  Nancy Liu  By participating in this virtual visit I agree to the following:  I hereby voluntarily request, consent and authorize [facility] and its employed or contracted physicians, physician assistants, nurse practitioners or other licensed health care professionals (the Practitioner), to provide me with telemedicine health care services (the "Services") as deemed necessary by the treating Practitioner. I acknowledge and consent to receive the Services by the Practitioner via telemedicine. I understand that the telemedicine visit will involve communicating with the Practitioner through live audiovisual communication technology and the disclosure of certain medical information by electronic transmission. I acknowledge that I have been given the opportunity to request an in-person assessment or other available alternative prior to the telemedicine visit and am voluntarily participating in the telemedicine visit.  I understand that I have the right to withhold or withdraw my consent to the use of telemedicine in the course of my care at any time, without affecting my right to future care or treatment, and that the Practitioner or I may terminate the telemedicine visit at any time. I understand that I have the right to inspect all information obtained and/or recorded in the course of the telemedicine visit and may receive copies of available information for a reasonable fee.  I understand that some of the potential risks of receiving the Services via telemedicine include:  Marland Kitchen Delay or interruption in medical evaluation due to technological equipment failure or disruption; . Information transmitted may not be sufficient (e.g. poor resolution of images) to allow for appropriate medical decision making by the  Practitioner; and/or  . In rare instances, security protocols could fail, causing a breach of personal health information.  Furthermore, I acknowledge that it is my responsibility to provide information about my medical history, conditions and care that is complete and accurate to the best of my ability. I acknowledge that Practitioner's advice, recommendations, and/or decision may be based on factors not within their control, such as incomplete or inaccurate data provided by me or distortions of diagnostic images or specimens that may result from electronic transmissions. I understand that the practice of medicine is not an exact science and that Practitioner makes no warranties or guarantees regarding treatment outcomes. I acknowledge that a copy of this consent can be made available to me via my patient portal ([facility]), or I can request a printed copy by calling the office of [facility].    I understand that my insurance will be billed for this visit.   I have read or had this consent read to me. . I understand the contents of this consent, which adequately explains the benefits and risks of the Services being provided via telemedicine.  . I have been provided ample opportunity to ask questions regarding this consent and the Services and have had my questions answered to my satisfaction. . I give my informed consent for the services to be provided through the use of telemedicine in my medical care

## 2020-04-23 NOTE — Patient Instructions (Signed)
Medication Instructions:  Your physician recommends that you continue on your current medications as directed. Please refer to the Current Medication list given to you today.  *If you need a refill on your cardiac medications before your next appointment, please call your pharmacy*   Lab Work: None today If you have labs (blood work) drawn today and your tests are completely normal, you will receive your results only by: Marland Kitchen MyChart Message (if you have MyChart) OR . A paper copy in the mail If you have any lab test that is abnormal or we need to change your treatment, we will call you to review the results.   Testing/Procedures: None today   Follow-Up: At [facility], you and your health needs are our priority.  As part of our continuing mission to provide you with exceptional heart care, we have created designated Provider Care Teams.  These Care Teams include your primary Cardiologist (physician) and Advanced Practice Providers (APPs -  Physician Assistants and Nurse Practitioners) who all work together to provide you with the care you need, when you need it.  We recommend signing up for the patient portal called "MyChart".  Sign up information is provided on this After Visit Summary.  MyChart is used to connect with patients for Virtual Visits (Telemedicine).  Patients are able to view lab/test results, encounter notes, upcoming appointments, etc.  Non-urgent messages can be sent to your provider as well.   To learn more about what you can do with MyChart, go to [email or web address].    Your next appointment:   6 month(s)  The format for your next appointment:   In Person  Provider:   Dr.Jonathan Branch      Other Instructions None      Thank you for choosing [facility] !

## 2020-04-24 DIAGNOSIS — H6123 Impacted cerumen, bilateral: Secondary | ICD-10-CM | POA: Diagnosis not present

## 2020-04-24 DIAGNOSIS — L723 Sebaceous cyst: Secondary | ICD-10-CM | POA: Diagnosis not present

## 2020-05-01 ENCOUNTER — Other Ambulatory Visit (HOSPITAL_COMMUNITY): Payer: Self-pay | Admitting: Family Medicine

## 2020-05-01 DIAGNOSIS — E119 Type 2 diabetes mellitus without complications: Secondary | ICD-10-CM | POA: Diagnosis not present

## 2020-05-01 DIAGNOSIS — E7849 Other hyperlipidemia: Secondary | ICD-10-CM | POA: Diagnosis not present

## 2020-05-01 DIAGNOSIS — Z1231 Encounter for screening mammogram for malignant neoplasm of breast: Secondary | ICD-10-CM

## 2020-05-01 DIAGNOSIS — K219 Gastro-esophageal reflux disease without esophagitis: Secondary | ICD-10-CM | POA: Diagnosis not present

## 2020-05-01 DIAGNOSIS — I10 Essential (primary) hypertension: Secondary | ICD-10-CM | POA: Diagnosis not present

## 2020-05-03 ENCOUNTER — Telehealth: Payer: Self-pay

## 2020-05-03 NOTE — Telephone Encounter (Signed)
PATIENT ON CANCELLATION LIST

## 2020-05-03 NOTE — Telephone Encounter (Signed)
Noted  

## 2020-05-03 NOTE — Telephone Encounter (Signed)
Pt called to r/s the apt with Leeanne Deed, NP on 05/08/20 due to another apt. Pt was r/s for 06/2020. Please place pt on a cancellation list.

## 2020-05-08 ENCOUNTER — Ambulatory Visit: Payer: PPO | Admitting: Gastroenterology

## 2020-05-08 DIAGNOSIS — E1169 Type 2 diabetes mellitus with other specified complication: Secondary | ICD-10-CM | POA: Diagnosis not present

## 2020-05-08 DIAGNOSIS — L723 Sebaceous cyst: Secondary | ICD-10-CM | POA: Diagnosis not present

## 2020-05-22 DIAGNOSIS — E119 Type 2 diabetes mellitus without complications: Secondary | ICD-10-CM | POA: Diagnosis not present

## 2020-05-22 DIAGNOSIS — H401131 Primary open-angle glaucoma, bilateral, mild stage: Secondary | ICD-10-CM | POA: Diagnosis not present

## 2020-05-22 DIAGNOSIS — H2513 Age-related nuclear cataract, bilateral: Secondary | ICD-10-CM | POA: Diagnosis not present

## 2020-05-22 DIAGNOSIS — H04123 Dry eye syndrome of bilateral lacrimal glands: Secondary | ICD-10-CM | POA: Diagnosis not present

## 2020-06-01 DIAGNOSIS — E119 Type 2 diabetes mellitus without complications: Secondary | ICD-10-CM | POA: Diagnosis not present

## 2020-06-01 DIAGNOSIS — K219 Gastro-esophageal reflux disease without esophagitis: Secondary | ICD-10-CM | POA: Diagnosis not present

## 2020-06-01 DIAGNOSIS — I10 Essential (primary) hypertension: Secondary | ICD-10-CM | POA: Diagnosis not present

## 2020-06-01 DIAGNOSIS — E7849 Other hyperlipidemia: Secondary | ICD-10-CM | POA: Diagnosis not present

## 2020-06-04 ENCOUNTER — Other Ambulatory Visit (HOSPITAL_COMMUNITY): Payer: Self-pay

## 2020-06-04 DIAGNOSIS — D509 Iron deficiency anemia, unspecified: Secondary | ICD-10-CM

## 2020-06-04 MED ORDER — ERGOCALCIFEROL 1.25 MG (50000 UT) PO CAPS: 50000.0000 [IU] | ORAL_CAPSULE | ORAL | 3 refills | Status: DC

## 2020-06-07 ENCOUNTER — Ambulatory Visit (HOSPITAL_COMMUNITY)
Admission: RE | Admit: 2020-06-07 | Discharge: 2020-06-07 | Disposition: A | Discharge status: Home / Self Care | Payer: PPO | Source: Ambulatory Visit | Attending: Family Medicine | Admitting: Family Medicine

## 2020-06-07 ENCOUNTER — Other Ambulatory Visit: Payer: Self-pay

## 2020-06-07 DIAGNOSIS — Z1231 Encounter for screening mammogram for malignant neoplasm of breast: Secondary | ICD-10-CM | POA: Diagnosis not present

## 2020-06-14 ENCOUNTER — Other Ambulatory Visit (HOSPITAL_COMMUNITY): Payer: Self-pay | Admitting: Family Medicine

## 2020-06-14 DIAGNOSIS — R928 Other abnormal and inconclusive findings on diagnostic imaging of breast: Secondary | ICD-10-CM

## 2020-06-19 ENCOUNTER — Other Ambulatory Visit: Payer: Self-pay

## 2020-06-19 ENCOUNTER — Ambulatory Visit (HOSPITAL_COMMUNITY)
Admission: RE | Admit: 2020-06-19 | Discharge: 2020-06-19 | Disposition: A | Discharge status: Home / Self Care | Payer: PPO | Source: Ambulatory Visit | Attending: Family Medicine | Admitting: Family Medicine

## 2020-06-19 DIAGNOSIS — R928 Other abnormal and inconclusive findings on diagnostic imaging of breast: Secondary | ICD-10-CM

## 2020-06-19 DIAGNOSIS — R922 Inconclusive mammogram: Secondary | ICD-10-CM | POA: Diagnosis not present

## 2020-06-19 DIAGNOSIS — R921 Mammographic calcification found on diagnostic imaging of breast: Secondary | ICD-10-CM | POA: Diagnosis not present

## 2020-06-21 ENCOUNTER — Other Ambulatory Visit (HOSPITAL_COMMUNITY): Payer: Self-pay | Admitting: *Deleted

## 2020-06-21 DIAGNOSIS — D500 Iron deficiency anemia secondary to blood loss (chronic): Secondary | ICD-10-CM

## 2020-06-22 ENCOUNTER — Other Ambulatory Visit: Payer: Self-pay

## 2020-06-22 ENCOUNTER — Inpatient Hospital Stay (HOSPITAL_COMMUNITY): Payer: PPO | Attending: Hematology

## 2020-06-22 DIAGNOSIS — K5909 Other constipation: Secondary | ICD-10-CM | POA: Insufficient documentation

## 2020-06-22 DIAGNOSIS — I10 Essential (primary) hypertension: Secondary | ICD-10-CM | POA: Insufficient documentation

## 2020-06-22 DIAGNOSIS — Z87891 Personal history of nicotine dependence: Secondary | ICD-10-CM | POA: Insufficient documentation

## 2020-06-22 DIAGNOSIS — I2510 Atherosclerotic heart disease of native coronary artery without angina pectoris: Secondary | ICD-10-CM | POA: Insufficient documentation

## 2020-06-22 DIAGNOSIS — E119 Type 2 diabetes mellitus without complications: Secondary | ICD-10-CM | POA: Diagnosis not present

## 2020-06-22 DIAGNOSIS — Z8249 Family history of ischemic heart disease and other diseases of the circulatory system: Secondary | ICD-10-CM | POA: Diagnosis not present

## 2020-06-22 DIAGNOSIS — D500 Iron deficiency anemia secondary to blood loss (chronic): Secondary | ICD-10-CM | POA: Diagnosis not present

## 2020-06-22 DIAGNOSIS — K259 Gastric ulcer, unspecified as acute or chronic, without hemorrhage or perforation: Secondary | ICD-10-CM | POA: Insufficient documentation

## 2020-06-22 DIAGNOSIS — K219 Gastro-esophageal reflux disease without esophagitis: Secondary | ICD-10-CM | POA: Insufficient documentation

## 2020-06-22 DIAGNOSIS — E785 Hyperlipidemia, unspecified: Secondary | ICD-10-CM | POA: Insufficient documentation

## 2020-06-22 DIAGNOSIS — Z79899 Other long term (current) drug therapy: Secondary | ICD-10-CM | POA: Diagnosis not present

## 2020-06-22 DIAGNOSIS — Z833 Family history of diabetes mellitus: Secondary | ICD-10-CM | POA: Insufficient documentation

## 2020-06-22 DIAGNOSIS — Z90710 Acquired absence of both cervix and uterus: Secondary | ICD-10-CM | POA: Diagnosis not present

## 2020-06-22 LAB — IRON AND TIBC
Iron: 58 ug/dL (ref 28–170)
Saturation Ratios: 16 % (ref 10.4–31.8)
TIBC: 356 ug/dL (ref 250–450)
UIBC: 298 ug/dL

## 2020-06-22 LAB — CBC WITH DIFFERENTIAL/PLATELET
Abs Immature Granulocytes: 0.04 10*3/uL (ref 0.00–0.07)
Basophils Absolute: 0 10*3/uL (ref 0.0–0.1)
Basophils Relative: 0 %
Eosinophils Absolute: 0.2 10*3/uL (ref 0.0–0.5)
Eosinophils Relative: 3 %
HCT: 41.2 % (ref 36.0–46.0)
Hemoglobin: 12.6 g/dL (ref 12.0–15.0)
Immature Granulocytes: 1 %
Lymphocytes Relative: 16 %
Lymphs Abs: 1.2 10*3/uL (ref 0.7–4.0)
MCH: 24.9 pg — ABNORMAL LOW (ref 26.0–34.0)
MCHC: 30.6 g/dL (ref 30.0–36.0)
MCV: 81.4 fL (ref 80.0–100.0)
Monocytes Absolute: 0.5 10*3/uL (ref 0.1–1.0)
Monocytes Relative: 7 %
Neutro Abs: 5.2 10*3/uL (ref 1.7–7.7)
Neutrophils Relative %: 73 %
Platelets: 257 10*3/uL (ref 150–400)
RBC: 5.06 MIL/uL (ref 3.87–5.11)
RDW: 14.8 % (ref 11.5–15.5)
WBC: 7.1 10*3/uL (ref 4.0–10.5)
nRBC: 0 % (ref 0.0–0.2)

## 2020-06-22 LAB — COMPREHENSIVE METABOLIC PANEL
ALT: 12 U/L (ref 0–44)
AST: 16 U/L (ref 15–41)
Albumin: 4 g/dL (ref 3.5–5.0)
Alkaline Phosphatase: 53 U/L (ref 38–126)
Anion gap: 9 (ref 5–15)
BUN: 15 mg/dL (ref 8–23)
CO2: 30 mmol/L (ref 22–32)
Calcium: 9.3 mg/dL (ref 8.9–10.3)
Chloride: 101 mmol/L (ref 98–111)
Creatinine, Ser: 1.06 mg/dL — ABNORMAL HIGH (ref 0.44–1.00)
GFR, Estimated: 54 mL/min — ABNORMAL LOW (ref >60)
Glucose, Bld: 104 mg/dL — ABNORMAL HIGH (ref 70–99)
Potassium: 3.9 mmol/L (ref 3.5–5.1)
Sodium: 140 mmol/L (ref 135–145)
Total Bilirubin: 0.4 mg/dL (ref 0.3–1.2)
Total Protein: 6.7 g/dL (ref 6.5–8.1)

## 2020-06-22 LAB — LACTATE DEHYDROGENASE: LDH: 146 U/L (ref 98–192)

## 2020-06-22 LAB — FERRITIN: Ferritin: 25 ng/mL (ref 11–307)

## 2020-06-22 LAB — VITAMIN D 25 HYDROXY (VIT D DEFICIENCY, FRACTURES): Vit D, 25-Hydroxy: 122.19 ng/mL — ABNORMAL HIGH (ref 30–100)

## 2020-06-22 LAB — VITAMIN B12: Vitamin B-12: 496 pg/mL (ref 180–914)

## 2020-06-22 LAB — FOLATE: Folate: 13 ng/mL (ref >5.9)

## 2020-06-26 ENCOUNTER — Encounter: Payer: Self-pay | Admitting: Gastroenterology

## 2020-06-26 ENCOUNTER — Ambulatory Visit (INDEPENDENT_AMBULATORY_CARE_PROVIDER_SITE_OTHER): Payer: PPO | Admitting: Gastroenterology

## 2020-06-26 ENCOUNTER — Encounter: Payer: Self-pay | Admitting: Internal Medicine

## 2020-06-26 ENCOUNTER — Other Ambulatory Visit: Payer: Self-pay

## 2020-06-26 DIAGNOSIS — R634 Abnormal weight loss: Secondary | ICD-10-CM

## 2020-06-26 DIAGNOSIS — K5900 Constipation, unspecified: Secondary | ICD-10-CM

## 2020-06-26 NOTE — Progress Notes (Signed)
Primary Care Physician:  Lucianne Lei, MD  Primary GI: Dr. Abbey Chatters  Patient Location: Home   Provider Location: [facility] office   Reason for Visit: Follow-up   Persons present on the virtual encounter, with roles: Patient and NP   Total time (minutes) spent on medical discussion: 15 minutes   Due to COVID-19, visit was conducted using virtual method.  Visit was requested by patient.  Virtual Visit via Telephone Note Due to COVID-19, visit is conducted virtually and was requested by patient.   I connected with Nancy Liu on 06/26/20 at  9:30 AM EST by telephone and verified that I am speaking with the correct person using two identifiers.   I discussed the limitations, risks, security and privacy concerns of performing an evaluation and management service by telephone and the availability of in person appointments. I also discussed with the patient that there may be a patient responsible charge related to this service. The patient expressed understanding and agreed to proceed.  Chief Complaint  Patient presents with  . Anemia    F/u  . Constipation    Occas will feel like she has some gas but then it comes out. Sometimes will wipe and will have a little stool once the gas passes     History of Present Illness: Nancy Liu is a very pleasant 80 year old female presenting today with history of IDA, with EGD and colonoscopy in October 2017 revealed many non-bleeding gastric ulcers, gastroduodenitis (no H. 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 Diverticulosis and non-bleeding hemorrhoids. Repeat EGD in January 2018 showederosivegastritis butulcers had healed. Due to reports of melena, she underwent EGD Sept 2021 focal inflammation in the gastric antrum consistent with gastritis on biopsies. No H.pylori.   Followed by Hematology for IDA. Not taking oral iron currently. Recent Hgb 12.6. Ferritin low normal at 25, down from 37 several months ago. Iron 58.  Upcoming appointment on Thursday.    CT in February 2021 with questionable fullness in the pancreatic head.  6 mm cyst in the pancreatic body.  MRCP negative for mass or ductal dilation.  Started on Elavil 10 mg at visit in Oct 2021. She states appetite is getting better. Trying to make herself eat. She has gained some weight since October, weighing 153 at home today, which is up 2 lbs. April 2021 weighed 160.   Gas at times and will wipe and small amount of stool will come out. BM about 2-3 times per week. Sometimes feels like is packing up at the bottom. Will take hand and push up then push down and will come out. Drinks smoothies two to 3 times per week. Has Miralax in it, protein.    Nightmares went away. Feels stress is better. Doesn't feel as bloated now. Prevacid 30 mg BID.      Past Medical History:  Diagnosis Date  . Arteriosclerotic cardiovascular disease (ASCVD) 2001   Bare-metal stent placed in the right coronary artery in 12/01; residual 50% lesion of the first diagonal and mid LAD  . Cerebrovascular disease   . Cyst, dermoid, arm, left 03/26/2018  . Diabetes mellitus    excellent control with a low-dose of a single oral agent  . GERD (gastroesophageal reflux disease)    with ulcers  . History of right coronary artery stent placement 2000  . Hyperlipidemia   . Hypertension   . Thalassemia minor   . Tobacco abuse, in remission    35 pack years; Quit in 1980  Past Surgical History:  Procedure Laterality Date  . BIOPSY  02/28/2020   Procedure: BIOPSY;  Surgeon: Eloise Harman, DO;  [facility];  Service: Endoscopy;;  . COLONOSCOPY  2006   Dr. Collene Mares: normal  . COLONOSCOPY  2000   Dr. Everlean Cherry: normal   . COLONOSCOPY N/A 03/03/2016   Dr. Oneida Alar: diverticulosis, non-bleeding hemorrhoids, redundant left colon.  Marland Kitchen DILATION AND CURETTAGE OF UTERUS    . ESOPHAGOGASTRODUODENOSCOPY  2000   Dr. Everlean Cherry: gastritis   . ESOPHAGOGASTRODUODENOSCOPY  2006   Dr.  Collene Mares: small hiatal hernia, gastritis, negative H.pylori   . ESOPHAGOGASTRODUODENOSCOPY N/A 03/03/2016   Procedure: ESOPHAGOGASTRODUODENOSCOPY (EGD);  Surgeon: Danie Binder, MD;  [facility];  Service: Endoscopy;  Laterality: N/A;  . ESOPHAGOGASTRODUODENOSCOPY N/A 06/03/2016   Dr. Oneida Alar: nonaggressive gastritis due to aspirin use. Previous ulcers had healed.  . ESOPHAGOGASTRODUODENOSCOPY (EGD) WITH PROPOFOL N/A 02/28/2020   focal inflammation in the gastric antrum consistent with gastritis on biopsies. No H.pylori.   Marland Kitchen EXCISION OF KELOID Left 03/26/2018   Procedure: EXCISION OF LEFT ARM MASS;  Surgeon: Fanny Skates, MD;  Location: [place];  Service: General;  Laterality: Left;  . INGUINAL HERNIA REPAIR  1970s   Right  . VAGINAL HYSTERECTOMY  1980   Unilateral oophorectomy     Current Meds  Medication Sig  . amitriptyline (ELAVIL) 10 MG tablet Take 10 mg by mouth at bedtime.  Marland Kitchen amLODipine (NORVASC) 10 MG tablet Take 1 tablet (10 mg total) by mouth daily.  Marland Kitchen aspirin EC 81 MG tablet Take 81 mg by mouth daily. Swallow whole.  . augmented betamethasone dipropionate (DIPROLENE-AF) 0.05 % ointment Apply 1 application topically 2 (two) times daily as needed (skin itching/dryness).   . carvedilol (COREG) 6.25 MG tablet Take 1 tablet (6.25 mg total) by mouth 2 (two) times daily.  . ergocalciferol (VITAMIN D2) 1.25 MG (50000 UT) capsule Take 1 capsule (50,000 Units total) by mouth once a week.  . ferrous sulfate 325 (65 FE) MG tablet Take 325 mg by mouth daily.   . irbesartan-hydrochlorothiazide (AVALIDE) 300-12.5 MG tablet Take 1 tablet by mouth daily.   . lansoprazole (PREVACID) 30 MG capsule Take 1 capsule (30 mg total) by mouth 2 (two) times daily before a meal.  . Latanoprostene Bunod 0.024 % SOLN Place 1 drop into both eyes See admin instructions. Instill 1 drop into both eyes 3 times a week at bedtime.  . metFORMIN (GLUCOPHAGE) 1000 MG tablet Take 500 mg by  mouth daily.   . Multiple Vitamins-Minerals (MULTI ADULT GUMMIES PO) Take 1 tablet by mouth daily. Centrum Multi-Gummies  . nitroGLYCERIN (NITROSTAT) 0.4 MG SL tablet Place 1 tablet (0.4 mg total) under the tongue every 5 (five) minutes as needed for chest pain.  . rosuvastatin (CRESTOR) 20 MG tablet Take 1 tablet (20 mg total) by mouth daily.     Family History  Problem Relation Age of Onset  . Diabetes Sister   . Heart disease Mother        also hypertension and asthma  . Cancer Mother   . Colon cancer Neg Hx   . Colon polyps Neg Hx     Social History   Socioeconomic History  . Marital status: Divorced    Spouse name: Not on file  . Number of children: 3  . Years of education: Not on file  . Highest education level: Not on file  Occupational History  . Occupation: Retired  Comment: Factory work  Tobacco Use  . Smoking status: Former Smoker    Packs/day: 1.00    Years: 18.00    Pack years: 18.00    Types: Cigarettes    Start date: 06/02/1965    Quit date: 06/09/1980    Years since quitting: 40.0  . Smokeless tobacco: Never Used  Vaping Use  . Vaping Use: Never used  Substance and Sexual Activity  . Alcohol use: No    Alcohol/week: 0.0 standard drinks  . Drug use: No  . Sexual activity: Never  Other Topics Concern  . Not on file  Social History Narrative   Lives with daughter    Social Determinants of Health   Financial Resource Strain: Not on file  Food Insecurity: Not on file  Transportation Needs: Not on file  Physical Activity: Not on file  Stress: Not on file  Social Connections: Not on file       Review of Systems: Gen: Denies fever, chills, anorexia. Denies fatigue, weakness, weight loss.  CV: Denies chest pain, palpitations, syncope, peripheral edema, and claudication. Resp: Denies dyspnea at rest, cough, wheezing, coughing up blood, and pleurisy. GI: see HPI Derm: Denies rash, itching, dry skin Psych: Denies depression, anxiety, memory loss,  confusion. No homicidal or suicidal ideation.  Heme: Denies bruising, bleeding, and enlarged lymph nodes.  Observations/Objective: No distress. Unable to perform physical exam due to telephone encounter. No video available.   Assessment and Plan: Very pleasant 80 year old female with history of IDA likely due to AVMs, prior history of PUD in remote past, recent EGD updated due to melena with gastritis and no H.pylori, weight loss now improving, constipation, early satiety improving, and anxiety.   IDA: continue to follow with Hematology. She is no longer on oral iron. Recent CBC and iron studies reviewed. I have asked her to talk with Hematology about need for oral iron going forward.   Weight loss: improved. Early satiety improved as well. No concerns for mesenteric ischemia. Extensive evaluation as noted above. Elavil has seemed helpful and will continue this.   GERD: continue Prevacid BID.   Constipation: I have asked that she add Benefiber daily. Fecal soiling/seepage likely due to internal hemorrhoids, lax sphincter tone. Will assess her in clinic in 6-8 weeks. May be a good banding candidate.   Return in 6-8 weeks.   Follow Up Instructions:    I discussed the assessment and treatment plan with the patient. The patient was provided an opportunity to ask questions and all were answered. The patient agreed with the plan and demonstrated an understanding of the instructions.   The patient was advised to call back or seek an in-person evaluation if the symptoms worsen or if the condition fails to improve as anticipated.  I provided 15 minutes of non face-to-face time during this telephone encounter.  Annitta Needs, PhD, ANP-BC [facility]

## 2020-06-26 NOTE — Patient Instructions (Signed)
Let's add Benefiber 2 teaspoons daily to your beverage of choice.   I would like to see you back in 6-8 weeks.   Please call if no improvement with bowel movements.    I enjoyed seeing you again today! As you know, I value our relationship and want to provide genuine, compassionate, and quality care. I welcome your feedback. If you receive a survey regarding your visit,  I greatly appreciate you taking time to fill this out. See you next time!  Annitta Needs, PhD, ANP-BC [facility]

## 2020-06-27 ENCOUNTER — Telehealth: Payer: Self-pay | Admitting: *Deleted

## 2020-06-27 NOTE — Telephone Encounter (Signed)
[name], you are scheduled for a virtual visit with your provider today.  Just as we do with appointments in the office, we must obtain your consent to participate.  Your consent will be active for this visit and any virtual visit you may have with one of our providers in the next 365 days.  If you have a MyChart account, I can also send a copy of this consent to you electronically.  All virtual visits are billed to your insurance company just like a traditional visit in the office.  As this is a virtual visit, video technology does not allow for your provider to perform a traditional examination.  This may limit your provider's ability to fully assess your condition.  If your provider identifies any concerns that need to be evaluated in person or the need to arrange testing such as labs, EKG, etc, we will make arrangements to do so.  Although advances in technology are sophisticated, we cannot ensure that it will always work on either your end or our end.  If the connection with a video visit is poor, we may have to switch to a telephone visit.  With either a video or telephone visit, we are not always able to ensure that we have a secure connection.   I need to obtain your verbal consent now.   Are you willing to proceed with your visit today?

## 2020-06-27 NOTE — Progress Notes (Signed)
Cc'ed to pcp °

## 2020-06-27 NOTE — Telephone Encounter (Signed)
Pt consented to a telephone visit on 06/26/2020. 

## 2020-06-28 ENCOUNTER — Other Ambulatory Visit: Payer: Self-pay

## 2020-06-28 ENCOUNTER — Inpatient Hospital Stay (HOSPITAL_BASED_OUTPATIENT_CLINIC_OR_DEPARTMENT_OTHER): Payer: PPO | Admitting: Oncology

## 2020-06-28 DIAGNOSIS — D500 Iron deficiency anemia secondary to blood loss (chronic): Secondary | ICD-10-CM

## 2020-06-28 NOTE — Progress Notes (Signed)
[facility] Follow up:    Lucianne Lei, [place] [address]   DIAGNOSIS: Microcytic anemia  I connected with [name] on 06/28/20 at  2:10 PM EST by telephone visit and verified that I am speaking with the correct person using two identifiers.   I discussed the limitations, risks, security and privacy concerns of performing an evaluation and management service by telemedicine and the availability of in-person appointments. I also discussed with the patient that there may be a patient responsible charge related to this service. The patient expressed understanding and agreed to proceed.   Other persons participating in the visit and their role in the encounter: None   Patient's location: Home  Provider's location: Clinic   CURRENT THERAPY: Oral iron supplementation  INTERVAL HISTORY: Nancy Liu 80 y.o. female was called for a telephone visit for microcytic anemia.    In the interim, she was seen by GI for iron deficiency anemia secondary to GI bleeding.  Colonoscopy from 2017 revealed many nonbleeding gastric ulcers and gastroduodenitis, AVMs and diverticulosis.  She has chronic constipation.  She uses MiraLAX which seems to help.  She was seen by her PCP in September 2021 for dizziness and found to be orthostatic.  She was encouraged to change positions slowly.  She had EGD on 02/28/20 secondary to weight loss, early satiety and lack of appetite.  EGD showed gastritis which was biopsied and negative. She increased her Prevacid to twice daily which helped with her appetite.  She was also started on Elavil 10 mg at bedtime which is a low-dose tricyclic antidepressant to help with appetite, dyspepsia and sleep aid.  She was seen by cardiology on 03/26/2020 for exertional chest tightness.  Cardiac catheterization was initially recommended and she was started on Imdur 30 mg daily.  Symptoms appeared to improve on their own.   Recommended Lexiscan Myoview. This was reassuring and showed no perfusion deficits.   In the interim, she is feeling better.  She has gained some weight back.  Her appetite has picked up some.  She lacks energy.  She states she is very sedentary and does not do much.  She had some recent close friends pass away.   She denies any bright red bleeding per rectum or melena.  She denies easy bruising or bleeding. Denies any nausea, vomiting, or diarrhea. Denies any new pains. Had not noticed any recent bleeding such as epistaxis, hematuria or hematochezia. Denies recent chest pain on exertion, shortness of breath on minimal exertion, pre-syncopal episodes, or palpitations. Denies any numbness or tingling in hands or feet. Denies any recent fevers, infections, or recent hospitalizations. Patient reports appetite at 75% and energy level at 75%.  She is eating well maintain her weight this time.    Patient Active Problem List   Diagnosis Date Noted  . Non-insulin dependent type 2 diabetes mellitus ([place]) 03/26/2020  . Loss of weight 07/13/2019  . Cyst, dermoid, arm, left 03/26/2018  . Diarrhea 08/10/2017  . Constipation 02/10/2017  . AVM (arteriovenous malformation) of small bowel, acquired 02/10/2017  . Iron deficiency anemia due to chronic blood loss 07/23/2016  . Gastritis and gastroduodenitis   . Symptomatic anemia 02/29/2016  . Weight loss 04/01/2012  . Chest pain on exertion 02/07/2011  . Microcytic anemia 01/31/2010  . Cerebrovascular disease 01/31/2010  . CAD S/P percutaneous coronary angioplasty 01/03/2009  . Dyslipidemia, goal LDL below 70 10/22/2007  . Essential hypertension 10/22/2007  is allergic to protonix [pantoprazole sodium] and shellfish allergy.  MEDICAL HISTORY: Past Medical History:  Diagnosis Date  . Arteriosclerotic cardiovascular disease (ASCVD) 2001   Bare-metal stent placed in the right coronary artery in 12/01; residual 50% lesion of the first diagonal and mid LAD   . Cerebrovascular disease   . Cyst, dermoid, arm, left 03/26/2018  . Diabetes mellitus    excellent control with a low-dose of a single oral agent  . GERD (gastroesophageal reflux disease)    with ulcers  . History of right coronary artery stent placement 2000  . Hyperlipidemia   . Hypertension   . Thalassemia minor   . Tobacco abuse, in remission    35 pack years; Quit in [place]: Past Surgical History:  Procedure Laterality Date  . BIOPSY  02/28/2020   Procedure: BIOPSY;  Surgeon: Eloise Harman, DO;  [facility];  Service: Endoscopy;;  . COLONOSCOPY  2006   Dr. Collene Mares: normal  . COLONOSCOPY  2000   Dr. Everlean Cherry: normal   . COLONOSCOPY N/A 03/03/2016   Dr. Oneida Alar: diverticulosis, non-bleeding hemorrhoids, redundant left colon.  Marland Kitchen DILATION AND CURETTAGE OF UTERUS    . ESOPHAGOGASTRODUODENOSCOPY  2000   Dr. Everlean Cherry: gastritis   . ESOPHAGOGASTRODUODENOSCOPY  2006   Dr. Collene Mares: small hiatal hernia, gastritis, negative H.pylori   . ESOPHAGOGASTRODUODENOSCOPY N/A 03/03/2016   Procedure: ESOPHAGOGASTRODUODENOSCOPY (EGD);  Surgeon: Danie Binder, MD;  [facility];  Service: Endoscopy;  Laterality: N/A;  . ESOPHAGOGASTRODUODENOSCOPY N/A 06/03/2016   Dr. Oneida Alar: nonaggressive gastritis due to aspirin use. Previous ulcers had healed.  . ESOPHAGOGASTRODUODENOSCOPY (EGD) WITH PROPOFOL N/A 02/28/2020   focal inflammation in the gastric antrum consistent with gastritis on biopsies. No H.pylori.   Marland Kitchen EXCISION OF KELOID Left 03/26/2018   Procedure: EXCISION OF LEFT ARM MASS;  Surgeon: Fanny Skates, MD;  Location: [place];  Service: General;  Laterality: Left;  . INGUINAL HERNIA REPAIR  1970s   Right  . VAGINAL HYSTERECTOMY  1980   Unilateral oophorectomy    SOCIAL HISTORY: Social History   Socioeconomic History  . Marital status: Divorced    Spouse name: Not on file  . Number of children: 3  . Years of education: Not on file  .  Highest education level: Not on file  Occupational History  . Occupation: Retired    Comment: [employer] work  Tobacco Use  . Smoking status: Former Smoker    Packs/day: 1.00    Years: 18.00    Pack years: 18.00    Types: Cigarettes    Start date: 06/02/1965    Quit date: 06/09/1980    Years since quitting: 40.0  . Smokeless tobacco: Never Used  Vaping Use  . Vaping Use: Never used  Substance and Sexual Activity  . Alcohol use: No    Alcohol/week: 0.0 standard drinks  . Drug use: No  . Sexual activity: Never  Other Topics Concern  . Not on file  Social History Narrative   Lives with daughter    Social Determinants of Health   Financial Resource Strain: Not on file  Food Insecurity: Not on file  Transportation Needs: Not on file  Physical Activity: Not on file  Stress: Not on file  Social Connections: Not on file  Intimate Partner Violence: Not on file    FAMILY HISTORY: Family History  Problem Relation Age of Onset  . Diabetes Sister   . Heart disease Mother  also hypertension and asthma  . Cancer Mother   . Colon cancer Neg Hx   . Colon polyps Neg Hx     Review of Systems  All other systems reviewed and are negative.   Vital signs: -Deferred due to telephone visit  Physical Exam -Deferred due to telephone visit -Patient was alert and oriented over the phone and in no acute distress next  LABORATORY DATA:  CBC    Component Value Date/Time   WBC 7.1 06/22/2020 0844   RBC 5.06 06/22/2020 0844   HGB 12.6 06/22/2020 0844   HCT 41.2 06/22/2020 0844   PLT 257 06/22/2020 0844   MCV 81.4 06/22/2020 0844   MCH 24.9 (L) 06/22/2020 0844   MCHC 30.6 06/22/2020 0844   RDW 14.8 06/22/2020 0844   LYMPHSABS 1.2 06/22/2020 0844   MONOABS 0.5 06/22/2020 0844   EOSABS 0.2 06/22/2020 0844   BASOSABS 0.0 06/22/2020 0844    CMP     Component Value Date/Time   NA 140 06/22/2020 0844   K 3.9 06/22/2020 0844   CL 101 06/22/2020 0844   CO2 30 06/22/2020 0844    GLUCOSE 104 (H) 06/22/2020 0844   BUN 15 06/22/2020 0844   CREATININE 1.06 (H) 06/22/2020 0844   CALCIUM 9.3 06/22/2020 0844   PROT 6.7 06/22/2020 0844   ALBUMIN 4.0 06/22/2020 0844   AST 16 06/22/2020 0844   ALT 12 06/22/2020 0844   ALKPHOS 53 06/22/2020 0844   BILITOT 0.4 06/22/2020 0844   GFRNONAA 54 (L) 06/22/2020 0844   GFRAA 53 (L) 12/13/2019 1020   All questions were answered to patient's stated satisfaction. Encouraged patient to call with any new concerns or questions before his next visit to the cancer center and we can certain see him sooner, if needed.     ASSESSMENT and THERAPY PLAN:   1.  Microcytic anemia: - This is felt secondary to thalassemia minor.  Patient has no M spike. - She received Feraheme on 12/08/2016. - She denies any bright red bleeding per rectum or melena. -Patient states she is feeling better but still tired.  -She is taking iron tablets daily but just restarted. -Labs from 06/22/2020 show a ferritin of 25, saturation 16%, hemoglobin 12.6, MCV 81.4. -Patient to continue iron tablets for 3 months and we can reassess energy and lab work at that time.    2.  Vitamin D deficiency: -She is taking vitamin D 50,000 units weekly. -Labs from 06/22/2020 show vitamin D level 122.19 -Recommend she stop taking her weekly vitamin D 50,000 and start taking a multivitamin with calcium and vitamin D.  Disposition: -RTC in 3 months with labs only.  -RTC in 6 months with labs (CBC, CMP, Ferritin, Iron, B12, Vit D).  No problem-specific Assessment & Plan notes found for this encounter.   No orders of the defined types were placed in this encounter.   All questions were answered. The patient knows to call the clinic with any problems, questions or concerns. We can certainly see the patient much sooner if necessary. This note was electronically signed.  I provided 20 minutes of non face-to-face telephone visit time during this encounter, and > 50% was spent  counseling as documented under my assessment & plan.   Jacquelin Hawking, NP 06/28/2020

## 2020-06-29 ENCOUNTER — Telehealth (HOSPITAL_COMMUNITY): Payer: PPO | Admitting: Nurse Practitioner

## 2020-06-30 DIAGNOSIS — I10 Essential (primary) hypertension: Secondary | ICD-10-CM | POA: Diagnosis not present

## 2020-06-30 DIAGNOSIS — E119 Type 2 diabetes mellitus without complications: Secondary | ICD-10-CM | POA: Diagnosis not present

## 2020-06-30 DIAGNOSIS — E7849 Other hyperlipidemia: Secondary | ICD-10-CM | POA: Diagnosis not present

## 2020-06-30 DIAGNOSIS — K219 Gastro-esophageal reflux disease without esophagitis: Secondary | ICD-10-CM | POA: Diagnosis not present

## 2020-07-19 DIAGNOSIS — D649 Anemia, unspecified: Secondary | ICD-10-CM | POA: Diagnosis not present

## 2020-07-19 DIAGNOSIS — E559 Vitamin D deficiency, unspecified: Secondary | ICD-10-CM | POA: Diagnosis not present

## 2020-07-19 DIAGNOSIS — Z0000 Encounter for general adult medical examination without abnormal findings: Secondary | ICD-10-CM | POA: Diagnosis not present

## 2020-07-19 DIAGNOSIS — D563 Thalassemia minor: Secondary | ICD-10-CM | POA: Diagnosis not present

## 2020-07-19 DIAGNOSIS — H409 Unspecified glaucoma: Secondary | ICD-10-CM | POA: Diagnosis not present

## 2020-07-19 DIAGNOSIS — E785 Hyperlipidemia, unspecified: Secondary | ICD-10-CM | POA: Diagnosis not present

## 2020-07-19 DIAGNOSIS — I10 Essential (primary) hypertension: Secondary | ICD-10-CM | POA: Diagnosis not present

## 2020-07-19 DIAGNOSIS — E1169 Type 2 diabetes mellitus with other specified complication: Secondary | ICD-10-CM | POA: Diagnosis not present

## 2020-07-19 DIAGNOSIS — K2100 Gastro-esophageal reflux disease with esophagitis, without bleeding: Secondary | ICD-10-CM | POA: Diagnosis not present

## 2020-07-24 DIAGNOSIS — H2513 Age-related nuclear cataract, bilateral: Secondary | ICD-10-CM | POA: Diagnosis not present

## 2020-07-24 DIAGNOSIS — H401131 Primary open-angle glaucoma, bilateral, mild stage: Secondary | ICD-10-CM | POA: Diagnosis not present

## 2020-07-30 DIAGNOSIS — K219 Gastro-esophageal reflux disease without esophagitis: Secondary | ICD-10-CM | POA: Diagnosis not present

## 2020-07-30 DIAGNOSIS — E7849 Other hyperlipidemia: Secondary | ICD-10-CM | POA: Diagnosis not present

## 2020-07-30 DIAGNOSIS — E119 Type 2 diabetes mellitus without complications: Secondary | ICD-10-CM | POA: Diagnosis not present

## 2020-07-30 DIAGNOSIS — I10 Essential (primary) hypertension: Secondary | ICD-10-CM | POA: Diagnosis not present

## 2020-08-08 ENCOUNTER — Other Ambulatory Visit (HOSPITAL_COMMUNITY): Payer: Self-pay

## 2020-08-10 ENCOUNTER — Telehealth: Payer: Self-pay

## 2020-08-10 ENCOUNTER — Telehealth: Payer: Self-pay | Admitting: Cardiology

## 2020-08-10 NOTE — Telephone Encounter (Signed)
Elmo Putt,  If she hasn't been taking it and it's been several weeks, it's fine to not fill any longer.

## 2020-08-10 NOTE — Telephone Encounter (Signed)
Pt c/o medication issue:  1. Name of Medication: isosorbide mononitrate (IMDUR) 30 MG 24 hr tablet [[id number]]  2. How are you currently taking this medication (dosage and times per day)? Not sure  3. Are you having a reaction (difficulty breathing--STAT)? No   4. What is your medication issue? Verdene Lennert is calling wanting to know if this pt is still supposed to be currently taking this medication. She states they have it on her list, but it has not been filled recently. If it is needed she is requesting a prescription be sent to [facility]. Please advise.

## 2020-08-10 NOTE — Telephone Encounter (Signed)
Per patient's last office note with Bernerd Pho, PA-C, patient was instructed to take isosorbide mononitrate (IMDUR) 30 mg 24 hr tablet for any recurring CAD symptoms she may have. Veronica with [facility] was made aware and verbalized understanding.

## 2020-08-10 NOTE — Telephone Encounter (Signed)
Nancy Liu [phone] called from [facility], they haven't been filling pts Elavil. Per pt, she isn't taking it. The pharmacy wants to make sure that pt isn't suppose to be on this medication. If pt is suppose to be on this medication, they would like RX sent to [facility] with directions.

## 2020-08-10 NOTE — Telephone Encounter (Signed)
Left a detailed message with the pharmacy

## 2020-08-24 ENCOUNTER — Other Ambulatory Visit: Payer: Self-pay

## 2020-08-24 ENCOUNTER — Ambulatory Visit: Payer: PPO | Admitting: Gastroenterology

## 2020-08-24 ENCOUNTER — Encounter: Payer: Self-pay | Admitting: Gastroenterology

## 2020-08-24 VITALS — BP 145/89 | HR 83 | Temp 96.8°F | Ht 62.5 in | Wt 152.8 lb

## 2020-08-24 DIAGNOSIS — D500 Iron deficiency anemia secondary to blood loss (chronic): Secondary | ICD-10-CM

## 2020-08-24 DIAGNOSIS — K921 Melena: Secondary | ICD-10-CM | POA: Diagnosis not present

## 2020-08-24 NOTE — Progress Notes (Signed)
Referring Provider: Lucianne Lei, MD Primary Care Physician:  Lucianne Lei, MD Primary GI: Dr. Abbey Chatters  Chief Complaint  Patient presents with  . Constipation    HPI:   Nancy Liu is a 80 y.o. female presenting today with a history of IDA, with EGD and colonoscopy in October 2017 revealed many non-bleeding gastric ulcers, gastroduodenitis (no H. 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 Diverticulosis and non-bleeding hemorrhoids. Repeat EGD in January 2018 showederosivegastritis butulcers had healed. Due to reports of melena, she underwent EGD Sept 2021 focal inflammation in the gastric antrum consistent with gastritis on biopsies. No H.pylori. CT in February 2021 with questionable fullness in the pancreatic head. 6 mm cyst in the pancreatic body. MRCP negative for mass or ductal dilation.  GERD: Prevacid BID. GERD well-controlled.   Constipation: Added Benefiber at last visit. BM about 2-3 times per week. She is not sure if taking Miralax or Benefiber daily. She states she did better with Senna 2 tablets daily and will get this again.   IDA: followed by Hematology. Currently on oral iron tablets. Black stool but on iron. Feels tired at times. Didn't sleep well last night. Will be anxious.   Weight loss: improved. Appetite is pretty good.   States she feels forgetful at times.    Past Medical History:  Diagnosis Date  . Arteriosclerotic cardiovascular disease (ASCVD) 2001   Bare-metal stent placed in the right coronary artery in 12/01; residual 50% lesion of the first diagonal and mid LAD  . Cerebrovascular disease   . Cyst, dermoid, arm, left 03/26/2018  . Diabetes mellitus    excellent control with a low-dose of a single oral agent  . GERD (gastroesophageal reflux disease)    with ulcers  . History of right coronary artery stent placement 2000  . Hyperlipidemia   . Hypertension   . Thalassemia minor   . Tobacco abuse, in remission    35 pack  years; Quit in 1980    Past Surgical History:  Procedure Laterality Date  . BIOPSY  02/28/2020   Procedure: BIOPSY;  Surgeon: Eloise Harman, DO;  [facility];  Service: Endoscopy;;  . COLONOSCOPY  2006   Dr. Collene Mares: normal  . COLONOSCOPY  2000   Dr. Everlean Cherry: normal   . COLONOSCOPY N/A 03/03/2016   Dr. Oneida Alar: diverticulosis, non-bleeding hemorrhoids, redundant left colon.  Marland Kitchen DILATION AND CURETTAGE OF UTERUS    . ESOPHAGOGASTRODUODENOSCOPY  2000   Dr. Everlean Cherry: gastritis   . ESOPHAGOGASTRODUODENOSCOPY  2006   Dr. Collene Mares: small hiatal hernia, gastritis, negative H.pylori   . ESOPHAGOGASTRODUODENOSCOPY N/A 03/03/2016   Procedure: ESOPHAGOGASTRODUODENOSCOPY (EGD);  Surgeon: Danie Binder, MD;  [facility];  Service: Endoscopy;  Laterality: N/A;  . ESOPHAGOGASTRODUODENOSCOPY N/A 06/03/2016   Dr. Oneida Alar: nonaggressive gastritis due to aspirin use. Previous ulcers had healed.  . ESOPHAGOGASTRODUODENOSCOPY (EGD) WITH PROPOFOL N/A 02/28/2020   focal inflammation in the gastric antrum consistent with gastritis on biopsies. No H.pylori.   Marland Kitchen EXCISION OF KELOID Left 03/26/2018   Procedure: EXCISION OF LEFT ARM MASS;  Surgeon: Fanny Skates, MD;  Location: [place];  Service: General;  Laterality: Left;  . INGUINAL HERNIA REPAIR  1970s   Right  . VAGINAL HYSTERECTOMY  1980   Unilateral oophorectomy    Current Outpatient Medications  Medication Sig Dispense Refill  . amLODipine (NORVASC) 10 MG tablet Take 1 tablet (10 mg total) by mouth daily. 90 tablet 3  . aspirin  EC 81 MG tablet Take 81 mg by mouth daily. Swallow whole.    . augmented betamethasone dipropionate (DIPROLENE-AF) 0.05 % ointment Apply 1 application topically 2 (two) times daily as needed (skin itching/dryness).     . carvedilol (COREG) 6.25 MG tablet Take 1 tablet (6.25 mg total) by mouth 2 (two) times daily. 180 tablet 3  . ergocalciferol (VITAMIN D2) 1.25 MG (50000 UT) capsule Take 1 capsule  (50,000 Units total) by mouth once a week. 16 capsule 3  . ferrous sulfate 325 (65 FE) MG tablet Take 325 mg by mouth daily.     . irbesartan-hydrochlorothiazide (AVALIDE) 300-12.5 MG tablet Take 1 tablet by mouth daily.     . lansoprazole (PREVACID) 30 MG capsule Take 1 capsule (30 mg total) by mouth 2 (two) times daily before a meal. 30 capsule 5  . Latanoprostene Bunod 0.024 % SOLN Place 1 drop into both eyes See admin instructions. Instill 1 drop into both eyes 3 times a week at bedtime.    . metFORMIN (GLUCOPHAGE) 1000 MG tablet Take 500 mg by mouth daily.     . nitroGLYCERIN (NITROSTAT) 0.4 MG SL tablet Place 1 tablet (0.4 mg total) under the tongue every 5 (five) minutes as needed for chest pain. 25 tablet 3  . rosuvastatin (CRESTOR) 20 MG tablet Take 1 tablet (20 mg total) by mouth daily. 90 tablet 3  . Sennosides-Docusate Sodium (SENNA S PO) Take 2 tablets by mouth as needed.    Marland Kitchen amitriptyline (ELAVIL) 10 MG tablet Take 10 mg by mouth at bedtime. (Patient not taking: Reported on 08/24/2020)    . Multiple Vitamins-Minerals (MULTI ADULT GUMMIES PO) Take 1 tablet by mouth daily. Centrum Multi-Gummies (Patient not taking: Reported on 08/24/2020)     No current facility-administered medications for this visit.    Allergies as of 08/24/2020 - Review Complete 08/24/2020  Allergen Reaction Noted  . Protonix [pantoprazole sodium]  03/27/2016  . Shellfish allergy  02/18/2011    Family History  Problem Relation Age of Onset  . Diabetes Sister   . Heart disease Mother        also hypertension and asthma  . Cancer Mother   . Colon cancer Neg Hx   . Colon polyps Neg Hx     Social History   Socioeconomic History  . Marital status: Divorced    Spouse name: Not on file  . Number of children: 3  . Years of education: Not on file  . Highest education level: Not on file  Occupational History  . Occupation: Retired    Comment: [employer] [place] work  Tobacco Use  . Smoking status: Former Smoker     Packs/day: 1.00    Years: 18.00    Pack years: 18.00    Types: Cigarettes    Start date: 06/02/1965    Quit date: 06/09/1980    Years since quitting: 40.2  . Smokeless tobacco: Never Used  Vaping Use  . Vaping Use: Never used  Substance and Sexual Activity  . Alcohol use: No    Alcohol/week: 0.0 standard drinks  . Drug use: No  . Sexual activity: Never  Other Topics Concern  . Not on file  Social History Narrative   Lives with daughter    Social Determinants of Health   Financial Resource Strain: Not on file  Food Insecurity: Not on file  Transportation Liu: Not on file  Physical Activity: Not on file  Stress: Not on file  Social Connections: Not on file  Review of Systems: Gen: Denies fever, chills, anorexia. Denies fatigue, weakness, weight loss.  CV: Denies chest pain, palpitations, syncope, peripheral edema, and claudication. Resp: Denies dyspnea at rest, cough, wheezing, coughing up blood, and pleurisy. GI: see HPI Derm: Denies rash, itching, dry skin Psych: Denies depression, anxiety, memory loss, confusion. No homicidal or suicidal ideation.  Heme: Denies bruising, bleeding, and enlarged lymph nodes.  Physical Exam: BP (!) 145/89   Pulse 83   Temp (!) 96.8 F (36 C) (Temporal)   Ht 5' 2.5" (1.588 m)   Wt 152 lb 12.8 oz (69.3 kg)   BMI 27.50 kg/m  General:   Alert and oriented. No distress noted. Pleasant and cooperative.  Head:  Normocephalic and atraumatic. Eyes:  Conjuctiva clear without scleral icterus. Mouth:  Mask in place Cardiac: S1 S2 present without murmurs Lungs: clear bilaterally Abdomen:  +BS, soft, non-tender and non-distended. No rebound or guarding. No HSM or masses noted. Msk:  Symmetrical without gross deformities. Normal posture. Extremities:  Without edema. Neurologic:  Alert and  oriented x4 Psych:  Alert and cooperative. Normal mood and affect.  ASSESSMENT: CHERRON BLITZER is a 80 y.o. female presenting today with a  history of IDA, with EGD and colonoscopy in October 2017 revealed many non-bleeding gastric ulcers, gastroduodenitis (no H. 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 Diverticulosis and non-bleeding hemorrhoids. Repeat EGD in January 2018 showederosivegastritis butulcers had healed. Due to reports of melena, she underwent EGD Sept 2021 focal inflammation in the gastric antrum consistent with gastritis on biopsies. No H.pylori. CT in February 2021 with questionable fullness in the pancreatic head. 6 mm cyst in the pancreatic body. MRCP negative for mass or ductal dilation.  IDA: noting black stool but is on iron. I suspect this is iron-related. However, we will check ifobt. If positive, recommend colonoscopy +/- capsule in near future. If negative, would recommend capsule first as she has never had this and may need to circle back to a colonoscopy if capsule unrevealing. Last colonoscopy in 2017. No hematochezia. Less likely dealing with right-sided occult colonic lesion. Hgb 12.6 in Jan 2022, which was stable, and ferritin was 25, which is only slightly down from Sept 2021 and improved from last July 2021. Overall, need to complete IDA evaluation and will determine which diagnostic route after ifbot completed.   GERD: Prevacid BID. GERD well-controlled.   Constipation:  BM about 2-3 times per week. She is not sure if taking Miralax or Benefiber daily. She states she did better with Senna 2 tablets daily and will get this again.    PLAN:  Continue Prevacid BID Check ifobt Add Senna tablets daily as this has worked in past Further recommendations after ifobt has been received   Nancy Needs, PhD, ANP-BC [facility]

## 2020-08-24 NOTE — Patient Instructions (Signed)
Please complete the stool test as soon as you can. We will decide on next steps after that.  Start taking the Senna tablets each evening. Please let me know if any further constipation.  Further recommendations to follow!  We will see in 2-3 months regardless!  I enjoyed seeing you again today! As you know, I value our relationship and want to provide genuine, compassionate, and quality care. I welcome your feedback. If you receive a survey regarding your visit,  I greatly appreciate you taking time to fill this out. See you next time!  Annitta Needs, PhD, ANP-BC [facility]

## 2020-08-27 ENCOUNTER — Ambulatory Visit (INDEPENDENT_AMBULATORY_CARE_PROVIDER_SITE_OTHER): Payer: Self-pay | Admitting: Gastroenterology

## 2020-08-27 ENCOUNTER — Other Ambulatory Visit: Payer: Self-pay

## 2020-08-27 DIAGNOSIS — K921 Melena: Secondary | ICD-10-CM

## 2020-08-27 DIAGNOSIS — D500 Iron deficiency anemia secondary to blood loss (chronic): Secondary | ICD-10-CM

## 2020-08-28 ENCOUNTER — Telehealth: Payer: Self-pay | Admitting: Internal Medicine

## 2020-08-28 LAB — IFOBT (OCCULT BLOOD): IFOBT: NEGATIVE

## 2020-08-28 NOTE — Telephone Encounter (Signed)
Pt dropped off her stool sample yesterday and today she said it was on Mychart but she doesn't understand what it says. Please call [phone]

## 2020-08-29 NOTE — Telephone Encounter (Signed)
Nancy Liu took care of this for me. Pt has been advised

## 2020-08-30 DIAGNOSIS — I10 Essential (primary) hypertension: Secondary | ICD-10-CM | POA: Diagnosis not present

## 2020-08-30 DIAGNOSIS — E7849 Other hyperlipidemia: Secondary | ICD-10-CM | POA: Diagnosis not present

## 2020-08-30 DIAGNOSIS — E119 Type 2 diabetes mellitus without complications: Secondary | ICD-10-CM | POA: Diagnosis not present

## 2020-09-17 ENCOUNTER — Telehealth: Payer: Self-pay | Admitting: Internal Medicine

## 2020-09-17 NOTE — Telephone Encounter (Signed)
PLEASE CALL PATIENT, SHE HAS UPCOMING APPOINTMENT AND WAS TOLD TO STOP HER CARAFATE ON HER SHEETS FOR HER PREP.  SHE DOES NOT KNOW IF SHE TAKES THAT OR NOT AND WANTED SOMEONE TO GO OVER HER MEDS WITH HER [phone]

## 2020-09-18 NOTE — Telephone Encounter (Signed)
Tried to call home#, phone would ring once then busy signal after several attempts.   Tried to call mobile#, LMOVM for return call.

## 2020-09-18 NOTE — Telephone Encounter (Signed)
Routing to [place]. Pt is scheduled for a givens. Please call her and go over instructions with her.

## 2020-09-18 NOTE — Telephone Encounter (Signed)
Spoke to pt, she wants to bring meds to office so she knows which ones to hold prior to [place].

## 2020-09-19 NOTE — Telephone Encounter (Signed)
Pt brought pill packs to office, showed her which pill is her Iron and she knew which one was Aspirin. Answered questions re: Givens instructions.

## 2020-09-20 DIAGNOSIS — E1169 Type 2 diabetes mellitus with other specified complication: Secondary | ICD-10-CM | POA: Diagnosis not present

## 2020-09-20 DIAGNOSIS — I119 Hypertensive heart disease without heart failure: Secondary | ICD-10-CM | POA: Diagnosis not present

## 2020-09-20 DIAGNOSIS — I10 Essential (primary) hypertension: Secondary | ICD-10-CM | POA: Diagnosis not present

## 2020-09-20 DIAGNOSIS — G1119 Other early-onset cerebellar ataxia: Secondary | ICD-10-CM | POA: Diagnosis not present

## 2020-09-20 DIAGNOSIS — G4739 Other sleep apnea: Secondary | ICD-10-CM | POA: Diagnosis not present

## 2020-09-25 ENCOUNTER — Encounter: Payer: Self-pay | Admitting: Neurology

## 2020-09-29 DIAGNOSIS — E119 Type 2 diabetes mellitus without complications: Secondary | ICD-10-CM | POA: Diagnosis not present

## 2020-09-29 DIAGNOSIS — E7849 Other hyperlipidemia: Secondary | ICD-10-CM | POA: Diagnosis not present

## 2020-09-29 DIAGNOSIS — K219 Gastro-esophageal reflux disease without esophagitis: Secondary | ICD-10-CM | POA: Diagnosis not present

## 2020-09-29 DIAGNOSIS — I10 Essential (primary) hypertension: Secondary | ICD-10-CM | POA: Diagnosis not present

## 2020-10-03 ENCOUNTER — Encounter (HOSPITAL_COMMUNITY): Payer: Self-pay

## 2020-10-03 ENCOUNTER — Other Ambulatory Visit: Payer: Self-pay

## 2020-10-03 ENCOUNTER — Emergency Department (HOSPITAL_COMMUNITY): Payer: PPO

## 2020-10-03 ENCOUNTER — Emergency Department (HOSPITAL_COMMUNITY)
Admission: EM | Admit: 2020-10-03 | Discharge: 2020-10-04 | Disposition: A | Discharge status: Home / Self Care | Payer: PPO | Attending: Emergency Medicine | Admitting: Emergency Medicine

## 2020-10-03 DIAGNOSIS — I10 Essential (primary) hypertension: Secondary | ICD-10-CM | POA: Diagnosis not present

## 2020-10-03 DIAGNOSIS — Z7982 Long term (current) use of aspirin: Secondary | ICD-10-CM | POA: Diagnosis not present

## 2020-10-03 DIAGNOSIS — Z79899 Other long term (current) drug therapy: Secondary | ICD-10-CM | POA: Diagnosis not present

## 2020-10-03 DIAGNOSIS — I2510 Atherosclerotic heart disease of native coronary artery without angina pectoris: Secondary | ICD-10-CM | POA: Insufficient documentation

## 2020-10-03 DIAGNOSIS — Z87891 Personal history of nicotine dependence: Secondary | ICD-10-CM | POA: Diagnosis not present

## 2020-10-03 DIAGNOSIS — Z7984 Long term (current) use of oral hypoglycemic drugs: Secondary | ICD-10-CM | POA: Diagnosis not present

## 2020-10-03 DIAGNOSIS — E119 Type 2 diabetes mellitus without complications: Secondary | ICD-10-CM | POA: Diagnosis not present

## 2020-10-03 DIAGNOSIS — R0789 Other chest pain: Secondary | ICD-10-CM | POA: Insufficient documentation

## 2020-10-03 DIAGNOSIS — R42 Dizziness and giddiness: Secondary | ICD-10-CM | POA: Insufficient documentation

## 2020-10-03 DIAGNOSIS — R079 Chest pain, unspecified: Secondary | ICD-10-CM | POA: Diagnosis not present

## 2020-10-03 LAB — CBC
HCT: 39.6 % (ref 36.0–46.0)
Hemoglobin: 12.4 g/dL (ref 12.0–15.0)
MCH: 24.8 pg — ABNORMAL LOW (ref 26.0–34.0)
MCHC: 31.3 g/dL (ref 30.0–36.0)
MCV: 79.2 fL — ABNORMAL LOW (ref 80.0–100.0)
Platelets: 287 10*3/uL (ref 150–400)
RBC: 5 MIL/uL (ref 3.87–5.11)
RDW: 15.8 % — ABNORMAL HIGH (ref 11.5–15.5)
WBC: 10.2 10*3/uL (ref 4.0–10.5)
nRBC: 0 % (ref 0.0–0.2)

## 2020-10-03 LAB — TROPONIN I (HIGH SENSITIVITY)
Troponin I (High Sensitivity): 3 ng/L (ref <18)
Troponin I (High Sensitivity): 2 ng/L (ref <18)

## 2020-10-03 LAB — BASIC METABOLIC PANEL
Anion gap: 11 (ref 5–15)
BUN: 23 mg/dL (ref 8–23)
CO2: 25 mmol/L (ref 22–32)
Calcium: 9.2 mg/dL (ref 8.9–10.3)
Chloride: 100 mmol/L (ref 98–111)
Creatinine, Ser: 1.07 mg/dL — ABNORMAL HIGH (ref 0.44–1.00)
GFR, Estimated: 53 mL/min — ABNORMAL LOW (ref >60)
Glucose, Bld: 94 mg/dL (ref 70–99)
Potassium: 3.5 mmol/L (ref 3.5–5.1)
Sodium: 136 mmol/L (ref 135–145)

## 2020-10-03 NOTE — ED Provider Notes (Signed)
Emergency Medicine Provider Triage Evaluation Note  Nancy Liu , a 80 y.o. female  was evaluated in triage.  Pt complains of chest pain that lasted a few minutes this evening.  Occurred while she was driving.  She felt very lightheaded.  Symptoms have resolved at this time  Review of Systems  Positive: Chest pain Negative: No shortness of breath  Physical Exam  BP (!) 160/91 (BP Location: Right Arm)   Pulse 87   Temp 98.2 F (36.8 C) (Oral)   Resp 18   Ht 1.588 m (5' 2.5")   Wt 63.5 kg   SpO2 100%   BMI 25.20 kg/m  Gen:   Awake, no distress   Resp:  Normal effort  MSK:   Moves extremities without difficulty  Other:  Cardiac: Regular rate and rhythm no murmurs  Medical Decision Making  Medically screening exam initiated at 8:04 PM.  Appropriate orders placed.  KELTY SZAFRAN was informed that the remainder of the evaluation will be completed by another provider, this initial triage assessment does not replace that evaluation, and the importance of remaining in the ED until their evaluation is complete.  Currently appears stable.  No signs of STEMI on her initial EKG.  We will proceed with cardiac work-up and x-rays   Dorie Rank, MD 10/03/20 2005

## 2020-10-03 NOTE — ED Triage Notes (Signed)
Pt to er, pt states that she was coming from the store and driving home when she developed some chest pain and felt like she was going to pass out.  States that she called her son to bring her to the er.  States that she has not had anything similar in the past.  States that she feels a little scared at this time.

## 2020-10-03 NOTE — ED Provider Notes (Signed)
[facility] EMERGENCY DEPARTMENT Provider Note   CSN: [id number] Arrival date & time: 10/03/20  1901     History Chief Complaint  Patient presents with  . Chest Pain    Nancy Liu is a 80 y.o. female.  HPI  This is a 80 year old female with a past with history of coronary artery disease who presents to the emergency department today secondary to chest pain.  Patient states she was driving and had a brief episode of chest tightness.  She states she has some associated lightheadedness.  She had no dyspnea, nausea, vomiting, diaphoresis.  Is self resolved pretty quickly.  She had 1 more episode while she was in the waiting room that lasted a couple seconds without any other associated symptoms.  She is currently pending a capsule endoscopy for 5-year follow-up of GI bleed.  She states that she has a follow-up with her cardiologist next month.  States she does not usually have symptoms like this.  She is compliant with all her medications.  No other associated symptoms at this time.     Past Medical History:  Diagnosis Date  . Arteriosclerotic cardiovascular disease (ASCVD) 2001   Bare-metal stent placed in the right coronary artery in 12/01; residual 50% lesion of the first diagonal and mid LAD  . Cerebrovascular disease   . Cyst, dermoid, arm, left 03/26/2018  . Diabetes mellitus    excellent control with a low-dose of a single oral agent  . GERD (gastroesophageal reflux disease)    with ulcers  . History of right coronary artery stent placement 2000  . Hyperlipidemia   . Hypertension   . Thalassemia minor   . Tobacco abuse, in remission    35 pack years; Quit in 1980    Patient Active Problem List   Diagnosis Date Noted  . Black stool 08/24/2020  . Non-insulin dependent type 2 diabetes mellitus ([place]) 03/26/2020  . Loss of weight 07/13/2019  . Cyst, dermoid, arm, left 03/26/2018  . Diarrhea 08/10/2017  . Constipation 02/10/2017  . AVM (arteriovenous malformation)  of small bowel, acquired 02/10/2017  . Iron deficiency anemia due to chronic blood loss 07/23/2016  . Gastritis and gastroduodenitis   . Symptomatic anemia 02/29/2016  . Weight loss 04/01/2012  . Chest pain on exertion 02/07/2011  . Microcytic anemia 01/31/2010  . Cerebrovascular disease 01/31/2010  . CAD S/P percutaneous coronary angioplasty 01/03/2009  . Dyslipidemia, goal LDL below 70 10/22/2007  . Essential hypertension 10/22/2007    Past Surgical History:  Procedure Laterality Date  . BIOPSY  02/28/2020   Procedure: BIOPSY;  Surgeon: Eloise Harman, DO;  [facility];  Service: Endoscopy;;  . COLONOSCOPY  2006   Dr. Collene Mares: normal  . COLONOSCOPY  2000   Dr. Everlean Cherry: normal   . COLONOSCOPY N/A 03/03/2016   Dr. Oneida Alar: diverticulosis, non-bleeding hemorrhoids, redundant left colon.  Marland Kitchen DILATION AND CURETTAGE OF UTERUS    . ESOPHAGOGASTRODUODENOSCOPY  2000   Dr. Everlean Cherry: gastritis   . ESOPHAGOGASTRODUODENOSCOPY  2006   Dr. Collene Mares: small hiatal hernia, gastritis, negative H.pylori   . ESOPHAGOGASTRODUODENOSCOPY N/A 03/03/2016   Procedure: ESOPHAGOGASTRODUODENOSCOPY (EGD);  Surgeon: Danie Binder, MD;  [facility];  Service: Endoscopy;  Laterality: N/A;  . ESOPHAGOGASTRODUODENOSCOPY N/A 06/03/2016   Dr. Oneida Alar: nonaggressive gastritis due to aspirin use. Previous ulcers had healed.  . ESOPHAGOGASTRODUODENOSCOPY (EGD) WITH PROPOFOL N/A 02/28/2020   focal inflammation in the gastric antrum consistent with gastritis on biopsies. No H.pylori.   Marland Kitchen  EXCISION OF KELOID Left 03/26/2018   Procedure: EXCISION OF LEFT ARM MASS;  Surgeon: Fanny Skates, MD;  Location: [place];  Service: General;  Laterality: Left;  . INGUINAL HERNIA REPAIR  1970s   Right  . VAGINAL HYSTERECTOMY  1980   Unilateral oophorectomy     OB History    Gravida  4   Para  3   Term  3   Preterm      AB  1   Living        SAB  1   IAB      Ectopic      Multiple       Live Births              Family History  Problem Relation Age of Onset  . Diabetes Sister   . Heart disease Mother        also hypertension and asthma  . Cancer Mother   . Colon cancer Neg Hx   . Colon polyps Neg Hx     Social History   Tobacco Use  . Smoking status: Former Smoker    Packs/day: 1.00    Years: 18.00    Pack years: 18.00    Types: Cigarettes    Start date: 06/02/1965    Quit date: 06/09/1980    Years since quitting: 40.3  . Smokeless tobacco: Never Used  Vaping Use  . Vaping Use: Never used  Substance Use Topics  . Alcohol use: No    Alcohol/week: 0.0 standard drinks  . Drug use: No    Home Medications Prior to Admission medications   Medication Sig Start Date End Date Taking? Authorizing Provider  amLODipine (NORVASC) 10 MG tablet Take 1 tablet (10 mg total) by mouth daily. 01/04/20   Arnoldo Lenis, MD  aspirin EC 81 MG tablet Take 81 mg by mouth daily. Swallow whole.    [provider]  augmented betamethasone dipropionate (DIPROLENE-AF) 0.05 % ointment Apply 1 application topically 2 (two) times daily as needed (skin itching/dryness).  12/14/18   [provider]  carvedilol (COREG) 6.25 MG tablet Take 1 tablet (6.25 mg total) by mouth 2 (two) times daily. 01/04/20   Arnoldo Lenis, MD  ergocalciferol (VITAMIN D2) 1.25 MG (50000 UT) capsule Take 1 capsule (50,000 Units total) by mouth once a week. 06/04/20   Derek Jack, MD  ferrous sulfate 325 (65 FE) MG tablet Take 325 mg by mouth daily.     [provider]  irbesartan-hydrochlorothiazide (AVALIDE) 300-12.5 MG tablet Take 1 tablet by mouth daily.  12/18/18   [provider]  lansoprazole (PREVACID) 30 MG capsule Take 1 capsule (30 mg total) by mouth 2 (two) times daily before a meal. 02/28/20 08/26/20  Carver, Elon Alas, DO  Latanoprostene Bunod 0.024 % SOLN Place 1 drop into both eyes See admin instructions. Instill 1 drop into both eyes 3 times a week at  bedtime.    [provider]  metFORMIN (GLUCOPHAGE) 1000 MG tablet Take 500 mg by mouth daily.  02/10/20   [provider]  nitroGLYCERIN (NITROSTAT) 0.4 MG SL tablet Place 1 tablet (0.4 mg total) under the tongue every 5 (five) minutes as needed for chest pain. 03/26/20   Erlene Quan, PA-C  rosuvastatin (CRESTOR) 20 MG tablet Take 1 tablet (20 mg total) by mouth daily. 01/04/20   Arnoldo Lenis, MD  Sennosides-Docusate Sodium (SENNA S PO) Take 2 tablets by mouth as needed.  [provider]    Allergies    Protonix [pantoprazole sodium] and Shellfish allergy  Review of Systems   Review of Systems  All other systems reviewed and are negative.   Physical Exam Updated Vital Signs BP (!) 160/91 (BP Location: Right Arm)   Pulse 87   Temp 98.2 F (36.8 C) (Oral)   Resp 18   Ht 5' 2.5" (1.588 m)   Wt 63.5 kg   SpO2 100%   BMI 25.20 kg/m   Physical Exam Vitals and nursing note reviewed.  Constitutional:      Appearance: She is well-developed.  HENT:     Head: Normocephalic and atraumatic.  Cardiovascular:     Rate and Rhythm: Normal rate and regular rhythm.  Pulmonary:     Effort: No respiratory distress.     Breath sounds: No stridor.  Abdominal:     General: There is no distension.     Palpations: Abdomen is soft.  Musculoskeletal:     Cervical back: Normal range of motion.     Right lower leg: No tenderness. No edema.     Left lower leg: No tenderness. No edema.  Skin:    General: Skin is warm and dry.  Neurological:     General: No focal deficit present.     Mental Status: She is alert.     ED Results / Procedures / Treatments   Labs (all labs ordered are listed, but only abnormal results are displayed) Labs Reviewed  BASIC METABOLIC PANEL - Abnormal; Notable for the following components:      Result Value   Creatinine, Ser 1.07 (*)    GFR, Estimated 53 (*)    All other components within normal limits  CBC - Abnormal; Notable  for the following components:   MCV 79.2 (*)    MCH 24.8 (*)    RDW 15.8 (*)    All other components within normal limits  TROPONIN I (HIGH SENSITIVITY)  TROPONIN I (HIGH SENSITIVITY)    EKG EKG Interpretation  Date/Time:  Wednesday Oct 03 2020 19:09:18 EDT Ventricular Rate:  84 PR Interval:  144 QRS Duration: 82 QT Interval:  378 QTC Calculation: 446 R Axis:   69 Text Interpretation: Normal sinus rhythm Nonspecific ST abnormality Abnormal ECG No significant change since last tracing Confirmed by Dorie Rank [phone]) on 10/03/2020 7:11:26 PM   Radiology DG Chest 2 View  Result Date: 10/03/2020 CLINICAL DATA:  Chest pain dizziness EXAM: CHEST - 2 VIEW COMPARISON:  02/29/2016, chest CT 10/20/2019 FINDINGS: The heart size and mediastinal contours are within normal limits. Both lungs are clear. The visualized skeletal structures are unremarkable. IMPRESSION: No active cardiopulmonary disease. Electronically Signed   By: Donavan Foil M.D.   On: 10/03/2020 19:40    Procedures Procedures   Medications Ordered in ED Medications - No data to display  ED Course  I have reviewed the triage vital signs and the nursing notes.  Pertinent labs & imaging results that were available during my care of the patient were reviewed by me and considered in my medical decision making (see chart for details).    MDM Rules/Calculators/A&P                          EKG appears normal and at baseline.  Troponins are less than 2x2.  Her symptoms could possibly be related to heart disease but not likely ACS and her heart score is low, so with the  2 troponins I feel she is safe for discharge to follow-up with Dr. Harl Bowie.  We will send a message to cardiology for expedited follow-up as needed.  Also discussed with the patient if the patient symptoms return, worsen or any other new symptoms with him she needs to return the emergency room immediately.  She also needs to track her blood pressures at home.  Final  Clinical Impression(s) / ED Diagnoses Final diagnoses:  None    Rx / DC Orders ED Discharge Orders    None       Ryman Rathgeber, Corene Cornea, MD 10/03/20 2346

## 2020-10-10 ENCOUNTER — Encounter (HOSPITAL_COMMUNITY)
Admission: RE | Disposition: A | Discharge status: Home / Self Care | Payer: Self-pay | Source: Home / Self Care | Attending: Internal Medicine

## 2020-10-10 ENCOUNTER — Encounter (HOSPITAL_COMMUNITY): Payer: Self-pay

## 2020-10-10 ENCOUNTER — Ambulatory Visit (HOSPITAL_COMMUNITY)
Admission: RE | Admit: 2020-10-10 | Discharge: 2020-10-10 | Disposition: A | Discharge status: Home / Self Care | Payer: PPO | Attending: Internal Medicine | Admitting: Internal Medicine

## 2020-10-10 DIAGNOSIS — K633 Ulcer of intestine: Secondary | ICD-10-CM | POA: Diagnosis not present

## 2020-10-10 DIAGNOSIS — K259 Gastric ulcer, unspecified as acute or chronic, without hemorrhage or perforation: Secondary | ICD-10-CM | POA: Diagnosis not present

## 2020-10-10 DIAGNOSIS — D500 Iron deficiency anemia secondary to blood loss (chronic): Secondary | ICD-10-CM

## 2020-10-10 DIAGNOSIS — D509 Iron deficiency anemia, unspecified: Secondary | ICD-10-CM | POA: Diagnosis not present

## 2020-10-10 HISTORY — PX: GIVENS CAPSULE STUDY: SHX5432

## 2020-10-10 SURGERY — IMAGING PROCEDURE, GI TRACT, INTRALUMINAL, VIA CAPSULE
Anesthesia: Monitor Anesthesia Care

## 2020-10-12 ENCOUNTER — Encounter (HOSPITAL_COMMUNITY): Payer: Self-pay | Admitting: Internal Medicine

## 2020-10-18 DIAGNOSIS — F338 Other recurrent depressive disorders: Secondary | ICD-10-CM | POA: Diagnosis not present

## 2020-10-18 DIAGNOSIS — I10 Essential (primary) hypertension: Secondary | ICD-10-CM | POA: Diagnosis not present

## 2020-10-18 DIAGNOSIS — R5383 Other fatigue: Secondary | ICD-10-CM | POA: Diagnosis not present

## 2020-10-18 DIAGNOSIS — F5102 Adjustment insomnia: Secondary | ICD-10-CM | POA: Diagnosis not present

## 2020-10-24 ENCOUNTER — Telehealth: Payer: Self-pay | Admitting: Gastroenterology

## 2020-10-24 NOTE — Op Note (Signed)
   Small Bowel Givens Capsule Study Procedure date:  10/10/20  Referring Provider:  Roseanne Kaufman, NP PCP:  Dr. Lucianne Lei, MD  Indication for procedure:  80 y/o female with history of IDA, with EGD and colonoscopy in October 2017 revealed many non-bleeding gastric ulcers, gastroduodenitis (no H. 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 Diverticulosis and non-bleeding hemorrhoids. Repeat EGD in January 2018 showed erosive gastritis but ulcers had healed. Due to reports of melena, she underwent EGD Sept 2021 focal inflammation in the gastric antrum consistent with gastritis on biopsies. No H.pylori.    Recent black stool, heme negative. Hemoglobin is normal. Ferritin low normal at 25.   Patient data:  Wt: 152 pouund Ht: 5'2.5"  Findings: Patient swallowed capsule without difficulty.  Capsule reached the cecum at 4 hours 41 minutes and 18 seconds.  Prep was good.  No active bleeding lesions noted during study.  Although there were possible "specks" of blood noted at 1 hour 47 minutes 22 seconds, 3 hours 3 minutes 16 seconds, 3 hours 7 minutes 42 seconds, 4 hours 9 minutes 15 seconds, 4 hours 9 minutes 29 seconds. No masses noted.  Couple of gastric erosions noted, for example at 1 hour 43 minutes 55 seconds.  Possible small bowel AVM versus erosion at 1 hour 47 minutes 12 seconds.  Small bowel erosions noted at 1 hours 48 minutes 26 seconds, 2 hours 14 minutes 3 seconds.  Possible scar at 2 hours 3 minutes 51 seconds and 2 hours 3 minutes and 9 seconds.  Increased vascularity noted proximal to the ileocecal valve, venous appearing structure at 4 hours 11 minutes 17 seconds, noted on multiple images no active bleeding.  First Gastric image: 41 seconds First Duodenal image: 1 hour 44 minutes and 4 seconds First Ileo-Cecal Valve image: 4 hours 40 minutes 57 seconds First Cecal image: 4 hours 41 minutes 18 seconds Gastric Passage time: 1 hour 43 minutes  Small Bowel Passage time: 2  hours 57 minutes   Summary & Recommendations: Few gastric and small bowel erosions and possible small bowel AVM as outlined above. No masses. No active bleeding lesions noted during this study.  Pertinent images reviewed with Dr. Abbey Chatters.   1. Continue to monitor H/H 2. Continue lansoprazole. 3. Avoid NSAIDs. 4. Oral iron if appropriate.  5. Follow up with Roseanne Kaufman, NP  Laureen Ochs. Bernarda Caffey [facility] [phone] 5/25/20222:23 PM

## 2020-10-24 NOTE — Telephone Encounter (Signed)
  Please let pt know her small bowel capsule study looked good overall. Her most recent H/H was normal. Keep upcoming ov with Roseanne Kaufman for further recommendations.    Few gastric and small bowel erosions and possible small bowel AVM as outlined above. No masses. No active bleeding lesions noted during this study.  Pertinent images reviewed with Dr. Abbey Chatters.   1. Continue to monitor H/H 2. Continue lansoprazole. 3. Avoid NSAIDs. 4. Oral iron if appropriate.  5. Follow up with Roseanne Kaufman, NP

## 2020-10-25 NOTE — Telephone Encounter (Signed)
Returned the pt's call, gave her result note to her and the recommendations. Pt asked me regarding where she takes 2 81 mg asa a day which her Cardiologist put her , my advise to her was to check with that Dr because I couldn't recommend she stopped that. Pt knows to use Tylenol for any aches or pains.

## 2020-10-25 NOTE — Telephone Encounter (Signed)
Phone and LMOVM for the pt to return call

## 2020-10-25 NOTE — Telephone Encounter (Signed)
Please let pt know she should continue her aspirin that her cardiologist put her on. Just not additional things like BC or Goody's powders or other NSAIDs.

## 2020-10-25 NOTE — Telephone Encounter (Signed)
NOTED     Phoned and LMOVM for pt to return the call after lunch

## 2020-10-26 NOTE — Telephone Encounter (Signed)
Phoned and spoke with the pt advised to continue asa from cardiologist and explained the different NSAID's to her. She agreed

## 2020-10-30 ENCOUNTER — Ambulatory Visit: Payer: PPO | Admitting: Gastroenterology

## 2020-10-30 DIAGNOSIS — E119 Type 2 diabetes mellitus without complications: Secondary | ICD-10-CM | POA: Diagnosis not present

## 2020-10-30 DIAGNOSIS — K219 Gastro-esophageal reflux disease without esophagitis: Secondary | ICD-10-CM | POA: Diagnosis not present

## 2020-10-30 DIAGNOSIS — I10 Essential (primary) hypertension: Secondary | ICD-10-CM | POA: Diagnosis not present

## 2020-10-30 DIAGNOSIS — E7849 Other hyperlipidemia: Secondary | ICD-10-CM | POA: Diagnosis not present

## 2020-11-04 ENCOUNTER — Other Ambulatory Visit: Payer: Self-pay | Admitting: Cardiology

## 2020-11-14 ENCOUNTER — Other Ambulatory Visit: Payer: Self-pay

## 2020-11-14 ENCOUNTER — Ambulatory Visit: Payer: PPO | Admitting: Cardiology

## 2020-11-14 VITALS — BP 132/74 | HR 80 | Ht 62.5 in | Wt 151.0 lb

## 2020-11-14 DIAGNOSIS — E782 Mixed hyperlipidemia: Secondary | ICD-10-CM

## 2020-11-14 DIAGNOSIS — I2510 Atherosclerotic heart disease of native coronary artery without angina pectoris: Secondary | ICD-10-CM | POA: Diagnosis not present

## 2020-11-14 DIAGNOSIS — I10 Essential (primary) hypertension: Secondary | ICD-10-CM | POA: Diagnosis not present

## 2020-11-14 NOTE — Progress Notes (Signed)
Clinical Summary Ms. Cobin is a 80 y.o.female seen today for follow up of the following medical problems.   1. CAD   - prior BMS to RCA in 2001 at [facility]   - 02/2011 MPI no ischemia   - echo 04/2013 showed LVEF 60-65% with grade I diastolic dysfunction.     - seen in ER 10/03/2020 for chest pain - Trops neg x 2, EKG SR no specific ischemic changes - 03/2020 nuclear stress no ischemia - no recurrent symptoms. Has imdur but has not taken.     2. Fatigue - fatigue with walking. Generalized fatigue, no specific sob/doe.     3. HTN   - compliant with meds   4. Hyperlpidiemia    - labs followed by pcp - she is on statin   5. SOB/Abnormal PFTs/Bronchiectasis - former smoker x 30-35 years. - PFTs Jan 2017 with severely redcued DLCO, referred to Dr Luan Pulling but only saw once.   - looking to estbalish with new pulmonary since Dr Luan Pulling has retired  - now [employer] by Dr Halford Chessman, appears she is overdue for f/u   6. Anemia -followed by hematology         SH: has 3 children. 2 grandchilren, one is a Marine scientist working at childrens hospital in [place]. Doristine Devoid grandbaby is on the way in November.    Past Medical History:  Diagnosis Date   Arteriosclerotic cardiovascular disease (ASCVD) 2001   Bare-metal stent placed in the right coronary artery in 12/01; residual 50% lesion of the first diagonal and mid LAD   Cerebrovascular disease    Cyst, dermoid, arm, left 03/26/2018   Diabetes mellitus    excellent control with a low-dose of a single oral agent   GERD (gastroesophageal reflux disease)    with ulcers   History of right coronary artery stent placement 2000   Hyperlipidemia    Hypertension    Thalassemia minor    Tobacco abuse, in remission    35 pack years; Quit in 1980     Allergies  Allergen Reactions   Protonix [Pantoprazole Sodium]     DRY LIPS   Shellfish Allergy     Unknown reaction     Current Outpatient Medications  Medication Sig Dispense  Refill   amLODipine (NORVASC) 10 MG tablet Take 1 tablet (10 mg total) by mouth daily. 90 tablet 3   aspirin EC 81 MG tablet Take 81 mg by mouth daily. Swallow whole.     augmented betamethasone dipropionate (DIPROLENE-AF) 0.05 % ointment Apply 1 application topically 2 (two) times daily as needed (skin itching/dryness).      carvedilol (COREG) 6.25 MG tablet TAKE ONE TABLET BY MOUTH BEFORE BREAKFAST and TAKE ONE TABLET BY MOUTH EVERYDAY AT BEDTIME 180 tablet 3   ergocalciferol (VITAMIN D2) 1.25 MG (50000 UT) capsule Take 1 capsule (50,000 Units total) by mouth once a week. 16 capsule 3   ferrous sulfate 325 (65 FE) MG tablet Take 325 mg by mouth daily.      irbesartan-hydrochlorothiazide (AVALIDE) 300-12.5 MG tablet Take 1 tablet by mouth daily.      lansoprazole (PREVACID) 30 MG capsule Take 1 capsule (30 mg total) by mouth 2 (two) times daily before a meal. 30 capsule 5   Latanoprostene Bunod 0.024 % SOLN Place 1 drop into both eyes See admin instructions. Instill 1 drop into both eyes 3 times a week at bedtime.     metFORMIN (GLUCOPHAGE) 1000 MG tablet Take 500  mg by mouth daily.      nitroGLYCERIN (NITROSTAT) 0.4 MG SL tablet Place 1 tablet (0.4 mg total) under the tongue every 5 (five) minutes as needed for chest pain. 25 tablet 3   rosuvastatin (CRESTOR) 20 MG tablet TAKE ONE TABLET BY MOUTH EVERYDAY AT BEDTIME 90 tablet 3   Sennosides-Docusate Sodium (SENNA S PO) Take 2 tablets by mouth as needed.     No current facility-administered medications for this visit.     Past Surgical History:  Procedure Laterality Date   BIOPSY  02/28/2020   Procedure: BIOPSY;  Surgeon: Eloise Harman, DO;  [facility];  Service: Endoscopy;;   COLONOSCOPY  2006   Dr. Collene Mares: normal   COLONOSCOPY  2000   Dr. Everlean Cherry: normal    COLONOSCOPY N/A 03/03/2016   Dr. Oneida Alar: diverticulosis, non-bleeding hemorrhoids, redundant left colon.   DILATION AND CURETTAGE OF UTERUS      ESOPHAGOGASTRODUODENOSCOPY  2000   Dr. Everlean Cherry: gastritis    ESOPHAGOGASTRODUODENOSCOPY  2006   Dr. Collene Mares: small hiatal hernia, gastritis, negative H.pylori    ESOPHAGOGASTRODUODENOSCOPY N/A 03/03/2016   Procedure: ESOPHAGOGASTRODUODENOSCOPY (EGD);  Surgeon: Danie Binder, MD;  [facility];  Service: Endoscopy;  Laterality: N/A;   ESOPHAGOGASTRODUODENOSCOPY N/A 06/03/2016   Dr. Oneida Alar: nonaggressive gastritis due to aspirin use. Previous ulcers had healed.   ESOPHAGOGASTRODUODENOSCOPY (EGD) WITH PROPOFOL N/A 02/28/2020   focal inflammation in the gastric antrum consistent with gastritis on biopsies. No H.pylori.    EXCISION OF KELOID Left 03/26/2018   Procedure: EXCISION OF LEFT ARM MASS;  Surgeon: Fanny Skates, MD;  Location: [place];  Service: General;  Laterality: Left;   GIVENS CAPSULE STUDY N/A 10/10/2020   Procedure: GIVENS CAPSULE STUDY;  Surgeon: Eloise Harman, DO;  [facility];  Service: Endoscopy;  Laterality: N/A;  7:30am   INGUINAL HERNIA REPAIR  1970s   Right   VAGINAL HYSTERECTOMY  1980   Unilateral oophorectomy     Allergies  Allergen Reactions   Protonix [Pantoprazole Sodium]     DRY LIPS   Shellfish Allergy     Unknown reaction      Family History  Problem Relation Age of Onset   Diabetes Sister    Heart disease Mother        also hypertension and asthma   Cancer Mother    Colon cancer Neg Hx    Colon polyps Neg Hx      Social History Ms. Greb reports that she quit smoking about 40 years ago. Her smoking use included cigarettes. She started smoking about 55 years ago. She has a 18.00 pack-year smoking history. She has never used smokeless tobacco. Ms. Leisey reports no history of alcohol use.   Review of Systems CONSTITUTIONAL: per hpi HEENT: Eyes: No visual loss, blurred vision, double vision or yellow sclerae.No hearing loss, sneezing, congestion, runny nose or sore throat.  SKIN: No rash or itching.   CARDIOVASCULAR: pe rhpi RESPIRATORY: No shortness of breath, cough or sputum.  GASTROINTESTINAL: No anorexia, nausea, vomiting or diarrhea. No abdominal pain or blood.  GENITOURINARY: No burning on urination, no polyuria NEUROLOGICAL: No headache, dizziness, syncope, paralysis, ataxia, numbness or tingling in the extremities. No change in bowel or bladder control.  MUSCULOSKELETAL: No muscle, back pain, joint pain or stiffness.  LYMPHATICS: No enlarged nodes. No history of splenectomy.  PSYCHIATRIC: No history of depression or anxiety.  ENDOCRINOLOGIC: No reports of sweating, cold or heat intolerance. No polyuria  or polydipsia.  Marland Kitchen   Physical Examination Today's Vitals   11/14/20 0853  BP: 132/74  Pulse: 80  SpO2: 97%  Weight: 151 lb (68.5 kg)  Height: 5' 2.5" (1.588 m)   Body mass index is 27.18 kg/m.  Gen: resting comfortably, no acute distress HEENT: no scleral icterus, pupils equal round and reactive, no palptable cervical adenopathy,  CV: RRR, no m/r/g, no jvd Resp: Clear to auscultation bilaterally GI: abdomen is soft, non-tender, non-distended, normal bowel sounds, no hepatosplenomegaly MSK: extremities are warm, no edema.  Skin: warm, no rash Neuro:  no focal deficits Psych: appropriate affect   Diagnostic Studies  02/2011 MPI   Low risk exercise/Lexiscan Myoview as outlined. Equivocal ST- segment changes were noted in the setting of lead motion artifact. No chest pain reported. Perfusion imaging is most consistent with breast attenuation without clear evidence of scar or ischemia. LVEF 77%.   04/06/13 Clinic EKG: sinus rhythm, nomral axis, normal intervals, non-specific ST/T changes   04/2013 Echo LVEF 33-74%, grade I diastolic dysfunction,   Jan 2017 PFTs: normal spirometry, minimal restrictive changes, severely decreased DLCO   Assessment and Plan   1. CAD -isoalted episode of nonexertional chest pain, negative evaluation in ER. 03/2020 nuclear stress  without ischemia - monitor at this time, if recurrence consider adjustements of antianginal therapy. If refractory could consider cath.    2. HTN   - at goal, continue current meds   3. Hyperlipidemia   - request pcp labs, continue statin  F/u 6 months     Arnoldo Lenis, M.D.

## 2020-11-14 NOTE — Patient Instructions (Signed)
Medication Instructions:   Your physician recommends that you continue on your current medications as directed. Please refer to the Current Medication list given to you today.  *If you need a refill on your cardiac medications before your next appointment, please call your pharmacy*   Lab [place]   If you have labs (blood work) drawn today and your tests are completely normal, you will receive your results only by: [place] (if you have MyChart) OR A paper copy in the mail If you have any lab test that is abnormal or we need to change your treatment, we will call you to review the results.   Testing/Procedures: NONE ORDERED  TODAY   Follow-Up: At [facility], you and your health needs are our priority.  As part of our continuing mission to provide you with exceptional heart care, we have created designated Provider Care Teams.  These Care Teams include your primary Cardiologist (physician) and Advanced Practice Providers (APPs -  Physician Assistants and Nurse Practitioners) who all work together to provide you with the care you need, when you need it.  We recommend signing up for the patient portal called "MyChart".  Sign up information is provided on this After Visit Summary.  MyChart is used to connect with patients for Virtual Visits (Telemedicine).  Patients are able to view lab/test results, encounter notes, upcoming appointments, etc.  Non-urgent messages can be sent to your provider as well.   To learn more about what you can do with MyChart, go to [email or web address].    Your next appointment:   6 month(s)  The format for your next appointment:   In Person  Provider:   Carlyle Dolly, MD   Other Instructions:

## 2020-11-15 DIAGNOSIS — I119 Hypertensive heart disease without heart failure: Secondary | ICD-10-CM | POA: Diagnosis not present

## 2020-11-15 DIAGNOSIS — I10 Essential (primary) hypertension: Secondary | ICD-10-CM | POA: Diagnosis not present

## 2020-11-29 DIAGNOSIS — I10 Essential (primary) hypertension: Secondary | ICD-10-CM | POA: Diagnosis not present

## 2020-11-29 DIAGNOSIS — E7849 Other hyperlipidemia: Secondary | ICD-10-CM | POA: Diagnosis not present

## 2020-11-29 DIAGNOSIS — E119 Type 2 diabetes mellitus without complications: Secondary | ICD-10-CM | POA: Diagnosis not present

## 2020-11-29 DIAGNOSIS — K219 Gastro-esophageal reflux disease without esophagitis: Secondary | ICD-10-CM | POA: Diagnosis not present

## 2020-11-29 NOTE — Progress Notes (Signed)
NEUROLOGY CONSULTATION NOTE  Nancy Liu MRN: [id number] DOB: [birth year]  Referring provider: Lucianne Lei, MD Primary care provider: Lucianne Lei, MD  Reason for consult:  tremor, lightheadedness, balance problems.  Assessment/Plan:    Lightheadedness Tremor  Unsteadiness   I do not appreciate any significant abnormalities on neurologic exam, such as Parkinson's disease.  No further workup at this time.  If symptoms should worsen, she is encouraged to make a follow up for re-examination.   Subjective:  Nancy Liu is a 80 year old right-handed female with CAD, HTN, HLD, DM II, cerebrovascular disease, Thalassemia minor who presents for tremor, lightheadedness and balance problems.  History supplemented by referring provider's note.  Symptoms started when Covid pandemic happened.  Increased depression.  Nightmares.  Decreased appetite and lost weight.  She reports problems with balance, meaning she stumbles and feels lightheaded.  Being out in the heat and sun aggravates it .  When she is eating sometimes, her hand is shaking if she is holding utensils.  She may notice it in the left hand too. Needs to hold it with her other hand to stop it.  She also reports cramps in feet.    No family history of tremor.     Labs from April:  CBC and CMP overall unremarkable; sed rate 6; Hgb A1c 5.6  PAST MEDICAL HISTORY: Past Medical History:  Diagnosis Date   Arteriosclerotic cardiovascular disease (ASCVD) 2001   Bare-metal stent placed in the right coronary artery in 12/01; residual 50% lesion of the first diagonal and mid LAD   Cerebrovascular disease    Cyst, dermoid, arm, left 03/26/2018   Diabetes mellitus    excellent control with a low-dose of a single oral agent   GERD (gastroesophageal reflux disease)    with ulcers   History of right coronary artery stent placement 2000   Hyperlipidemia    Hypertension    Thalassemia minor    Tobacco abuse, in remission    35  pack years; Quit in 1980    PAST SURGICAL HISTORY: Past Surgical History:  Procedure Laterality Date   BIOPSY  02/28/2020   Procedure: BIOPSY;  Surgeon: Eloise Harman, DO;  [facility];  Service: Endoscopy;;   COLONOSCOPY  2006   Dr. Collene Mares: normal   COLONOSCOPY  2000   Dr. Everlean Cherry: normal    COLONOSCOPY N/A 03/03/2016   Dr. Oneida Alar: diverticulosis, non-bleeding hemorrhoids, redundant left colon.   DILATION AND CURETTAGE OF UTERUS     ESOPHAGOGASTRODUODENOSCOPY  2000   Dr. Everlean Cherry: gastritis    ESOPHAGOGASTRODUODENOSCOPY  2006   Dr. Collene Mares: small hiatal hernia, gastritis, negative H.pylori    ESOPHAGOGASTRODUODENOSCOPY N/A 03/03/2016   Procedure: ESOPHAGOGASTRODUODENOSCOPY (EGD);  Surgeon: Danie Binder, MD;  [facility];  Service: Endoscopy;  Laterality: N/A;   ESOPHAGOGASTRODUODENOSCOPY N/A 06/03/2016   Dr. Oneida Alar: nonaggressive gastritis due to aspirin use. Previous ulcers had healed.   ESOPHAGOGASTRODUODENOSCOPY (EGD) WITH PROPOFOL N/A 02/28/2020   focal inflammation in the gastric antrum consistent with gastritis on biopsies. No H.pylori.    EXCISION OF KELOID Left 03/26/2018   Procedure: EXCISION OF LEFT ARM MASS;  Surgeon: Fanny Skates, MD;  Location: [place];  Service: General;  Laterality: Left;   GIVENS CAPSULE STUDY N/A 10/10/2020   Procedure: GIVENS CAPSULE STUDY;  Surgeon: Eloise Harman, DO;  [facility];  Service: Endoscopy;  Laterality: N/A;  7:30am   INGUINAL HERNIA REPAIR  1970s   Right  VAGINAL HYSTERECTOMY  1980   Unilateral oophorectomy    MEDICATIONS: Current Outpatient Medications on File Prior to Visit  Medication Sig Dispense Refill   amLODipine (NORVASC) 10 MG tablet Take 1 tablet (10 mg total) by mouth daily. 90 tablet 3   aspirin EC 81 MG tablet Take 81 mg by mouth daily. Swallow whole.     augmented betamethasone dipropionate (DIPROLENE-AF) 0.05 % ointment Apply 1 application topically 2 (two) times  daily as needed (skin itching/dryness).      carvedilol (COREG) 6.25 MG tablet TAKE ONE TABLET BY MOUTH BEFORE BREAKFAST and TAKE ONE TABLET BY MOUTH EVERYDAY AT BEDTIME 180 tablet 3   COQ10 MAXIMUM STRENGTH 400 MG CAPS Take 400 mg by mouth every morning.     doxepin (SINEQUAN) 10 MG capsule Take 10 mg by mouth at bedtime.     ergocalciferol (VITAMIN D2) 1.25 MG (50000 UT) capsule Take 1 capsule (50,000 Units total) by mouth once a week. 16 capsule 3   ferrous sulfate 325 (65 FE) MG tablet Take 325 mg by mouth daily.      irbesartan-hydrochlorothiazide (AVALIDE) 300-12.5 MG tablet Take 1 tablet by mouth daily.      lansoprazole (PREVACID) 30 MG capsule Take 1 capsule (30 mg total) by mouth 2 (two) times daily before a meal. 30 capsule 5   Latanoprostene Bunod 0.024 % SOLN Place 1 drop into both eyes See admin instructions. Instill 1 drop into both eyes 3 times a week at bedtime.     metFORMIN (GLUCOPHAGE) 1000 MG tablet Take 500 mg by mouth daily.      nitroGLYCERIN (NITROSTAT) 0.4 MG SL tablet Place 1 tablet (0.4 mg total) under the tongue every 5 (five) minutes as needed for chest pain. 25 tablet 3   rosuvastatin (CRESTOR) 20 MG tablet TAKE ONE TABLET BY MOUTH EVERYDAY AT BEDTIME 90 tablet 3   Sennosides-Docusate Sodium (SENNA S PO) Take 2 tablets by mouth as needed.     Turmeric (CURCUMIN 95) 500 MG CAPS Take by mouth daily.     No current facility-administered medications on file prior to visit.    ALLERGIES: Allergies  Allergen Reactions   Protonix [Pantoprazole Sodium]     DRY LIPS   Shellfish Allergy     Unknown reaction    FAMILY HISTORY: Family History  Problem Relation Age of Onset   Diabetes Sister    Heart disease Mother        also hypertension and asthma   Cancer Mother    Colon cancer Neg Hx    Colon polyps Neg Hx     Objective:  Height 5\' 4"  (1.626 m), weight 151 lb (68.5 kg).  Orthostatic vitals normal. General: No acute distress.  Patient appears  well-groomed.   Head:  Normocephalic/atraumatic Eyes:  fundi examined but not visualized Neck: supple, no paraspinal tenderness, full range of motion Back: No paraspinal tenderness Heart: regular rate and rhythm Lungs: Clear to auscultation bilaterally. Vascular: No carotid bruits. Neurological Exam: Mental status: alert and oriented to person, place, and time, recent and remote memory intact, fund of knowledge intact, attention and concentration intact, speech fluent and not dysarthric, language intact. Cranial nerves: CN I: not tested CN II: pupils equal, round and reactive to light, visual fields intact CN III, IV, VI:  full range of motion, no nystagmus, no ptosis CN V: facial sensation intact. CN VII: upper and lower face symmetric CN VIII: hearing intact CN IX, X: gag intact, uvula midline CN XI: sternocleidomastoid  and trapezius muscles intact CN XII: tongue midline Bulk & Tone: normal, no fasciculations or rigidity Motor:  muscle strength 5/5 throughout.  No tremor Sensation:  Pinprick, temperature and vibratory sensation intact. Deep Tendon Reflexes:  2+ throughout,  toes downgoing.   Finger to nose testing:  Without dysmetria.   Heel to shin:  Without dysmetria.   Gait:  Normal station and stride, upright posture, slight reduce left arm swing.  able to tandem walk.  Romberg negative.    Thank you for allowing me to take part in the care of this patient.  Metta Clines, DO  CC: Beatrix Shipper, MD

## 2020-11-30 ENCOUNTER — Other Ambulatory Visit: Payer: Self-pay

## 2020-11-30 ENCOUNTER — Ambulatory Visit: Payer: PPO | Admitting: Neurology

## 2020-11-30 ENCOUNTER — Encounter: Payer: Self-pay | Admitting: Neurology

## 2020-11-30 VITALS — Ht 64.0 in | Wt 151.0 lb

## 2020-11-30 DIAGNOSIS — R251 Tremor, unspecified: Secondary | ICD-10-CM | POA: Diagnosis not present

## 2020-11-30 DIAGNOSIS — R42 Dizziness and giddiness: Secondary | ICD-10-CM | POA: Diagnosis not present

## 2020-11-30 DIAGNOSIS — R2681 Unsteadiness on feet: Secondary | ICD-10-CM

## 2020-11-30 NOTE — Patient Instructions (Signed)
At this time, I do not see any significant neurologic abnormalities on your exam.  Of course, if you note symptoms getting worse, then please follow up

## 2020-12-18 ENCOUNTER — Encounter: Payer: Self-pay | Admitting: Emergency Medicine

## 2020-12-18 ENCOUNTER — Other Ambulatory Visit: Payer: Self-pay

## 2020-12-18 ENCOUNTER — Ambulatory Visit
Admission: EM | Admit: 2020-12-18 | Discharge: 2020-12-18 | Disposition: A | Discharge status: Home / Self Care | Payer: PPO | Attending: Urgent Care | Admitting: Urgent Care

## 2020-12-18 DIAGNOSIS — Z7689 Persons encountering health services in other specified circumstances: Secondary | ICD-10-CM | POA: Diagnosis not present

## 2020-12-18 DIAGNOSIS — J069 Acute upper respiratory infection, unspecified: Secondary | ICD-10-CM

## 2020-12-18 DIAGNOSIS — R070 Pain in throat: Secondary | ICD-10-CM

## 2020-12-18 DIAGNOSIS — E119 Type 2 diabetes mellitus without complications: Secondary | ICD-10-CM | POA: Diagnosis not present

## 2020-12-18 LAB — POCT RAPID STREP A (OFFICE): Rapid Strep A Screen: NEGATIVE

## 2020-12-18 MED ORDER — BENZONATATE 100 MG PO CAPS: 100.0000 mg | ORAL_CAPSULE | Freq: Three times a day (TID) | ORAL | 0 refills | Status: DC | PRN

## 2020-12-18 MED ORDER — FLUTICASONE PROPIONATE 50 MCG/ACT NA SUSP: 2.0000 | Freq: Every day | NASAL | 12 refills | Status: DC

## 2020-12-18 MED ORDER — CETIRIZINE HCL 5 MG PO TABS: 5.0000 mg | ORAL_TABLET | Freq: Every day | ORAL | 0 refills | Status: DC

## 2020-12-18 NOTE — ED Triage Notes (Signed)
Patient c/o sore throat and productive cough x 2 days.   Patient denies fever at home. Patient denies SOB.   Patient denies painful swallowing.   Patient is unaware of characteristics of sputum.  Patient hasn't taken any mediation for symptoms.

## 2020-12-18 NOTE — Discharge Instructions (Addendum)

## 2020-12-18 NOTE — ED Provider Notes (Signed)
[place]   MRN: [id number] DOB: Dec 09, 1940  Subjective:   Nancy Liu is a 80 y.o. female presenting for 2-day history of acute onset persistent cough, throat pain, slight malaise and fatigue.  Denies fever, chest pain, shortness of breath, wheezing, body aches.  Patient has not tried any medications for relief as some general her symptoms are mild.  She does have a history of smoking but quit years ago.  She has previously been told she has COPD but has never had to use any kind of inhaler.  She has her COVID vaccinations with boosters.  No current facility-administered medications for this encounter.  Current Outpatient Medications:    amLODipine (NORVASC) 10 MG tablet, Take 1 tablet (10 mg total) by mouth daily., Disp: 90 tablet, Rfl: 3   aspirin EC 81 MG tablet, Take 81 mg by mouth daily. Swallow whole., Disp: , Rfl:    augmented betamethasone dipropionate (DIPROLENE-AF) 0.05 % ointment, Apply 1 application topically 2 (two) times daily as needed (skin itching/dryness). , Disp: , Rfl:    carvedilol (COREG) 6.25 MG tablet, TAKE ONE TABLET BY MOUTH BEFORE BREAKFAST and TAKE ONE TABLET BY MOUTH EVERYDAY AT BEDTIME, Disp: 180 tablet, Rfl: 3   COQ10 MAXIMUM STRENGTH 400 MG CAPS, Take 400 mg by mouth every morning., Disp: , Rfl:    doxepin (SINEQUAN) 10 MG capsule, Take 10 mg by mouth at bedtime., Disp: , Rfl:    ergocalciferol (VITAMIN D2) 1.25 MG (50000 UT) capsule, Take 1 capsule (50,000 Units total) by mouth once a week., Disp: 16 capsule, Rfl: 3   ferrous sulfate 325 (65 FE) MG tablet, Take 325 mg by mouth daily. , Disp: , Rfl:    irbesartan-hydrochlorothiazide (AVALIDE) 300-12.5 MG tablet, Take 1 tablet by mouth daily. , Disp: , Rfl:    lansoprazole (PREVACID) 30 MG capsule, Take 1 capsule (30 mg total) by mouth 2 (two) times daily before a meal., Disp: 30 capsule, Rfl: 5   Latanoprostene Bunod 0.024 % SOLN, Place 1 drop into both eyes See admin instructions.  Instill 1 drop into both eyes 3 times a week at bedtime., Disp: , Rfl:    metFORMIN (GLUCOPHAGE) 1000 MG tablet, Take 500 mg by mouth daily. , Disp: , Rfl:    nitroGLYCERIN (NITROSTAT) 0.4 MG SL tablet, Place 1 tablet (0.4 mg total) under the tongue every 5 (five) minutes as needed for chest pain., Disp: 25 tablet, Rfl: 3   rosuvastatin (CRESTOR) 20 MG tablet, TAKE ONE TABLET BY MOUTH EVERYDAY AT BEDTIME, Disp: 90 tablet, Rfl: 3   Sennosides-Docusate Sodium (SENNA S PO), Take 2 tablets by mouth as needed., Disp: , Rfl:    Turmeric 500 MG CAPS, Take by mouth daily., Disp: , Rfl:    Allergies  Allergen Reactions   Protonix [Pantoprazole Sodium]     DRY LIPS   Shellfish Allergy     Unknown reaction    Past Medical History:  Diagnosis Date   Arteriosclerotic cardiovascular disease (ASCVD) 2001   Bare-metal stent placed in the right coronary artery in 12/01; residual 50% lesion of the first diagonal and mid LAD   Cerebrovascular disease    Cyst, dermoid, arm, left 03/26/2018   Diabetes mellitus    excellent control with a low-dose of a single oral agent   GERD (gastroesophageal reflux disease)    with ulcers   History of right coronary artery stent placement 2000   Hyperlipidemia    Hypertension    Thalassemia minor  Tobacco abuse, in remission    35 pack years; Quit in 1980     Past Surgical History:  Procedure Laterality Date   BIOPSY  02/28/2020   Procedure: BIOPSY;  Surgeon: Eloise Harman, DO;  [facility];  Service: Endoscopy;;   COLONOSCOPY  2006   Dr. Collene Mares: normal   COLONOSCOPY  2000   Dr. Everlean Cherry: normal    COLONOSCOPY N/A 03/03/2016   Dr. Oneida Alar: diverticulosis, non-bleeding hemorrhoids, redundant left colon.   DILATION AND CURETTAGE OF UTERUS     ESOPHAGOGASTRODUODENOSCOPY  2000   Dr. Everlean Cherry: gastritis    ESOPHAGOGASTRODUODENOSCOPY  2006   Dr. Collene Mares: small hiatal hernia, gastritis, negative H.pylori    ESOPHAGOGASTRODUODENOSCOPY N/A 03/03/2016    Procedure: ESOPHAGOGASTRODUODENOSCOPY (EGD);  Surgeon: Danie Binder, MD;  [facility];  Service: Endoscopy;  Laterality: N/A;   ESOPHAGOGASTRODUODENOSCOPY N/A 06/03/2016   Dr. Oneida Alar: nonaggressive gastritis due to aspirin use. Previous ulcers had healed.   ESOPHAGOGASTRODUODENOSCOPY (EGD) WITH PROPOFOL N/A 02/28/2020   focal inflammation in the gastric antrum consistent with gastritis on biopsies. No H.pylori.    EXCISION OF KELOID Left 03/26/2018   Procedure: EXCISION OF LEFT ARM MASS;  Surgeon: Fanny Skates, MD;  Location: [facility];  Service: General;  Laterality: Left;   GIVENS CAPSULE STUDY N/A 10/10/2020   Procedure: GIVENS CAPSULE STUDY;  Surgeon: Eloise Harman, DO;  Location[facility];  Service: Endoscopy;  Laterality: N/A;  7:30am   INGUINAL HERNIA REPAIR  1970s   Right   VAGINAL HYSTERECTOMY  1980   Unilateral oophorectomy    Family History  Problem Relation Age of Onset   Diabetes Sister    Heart disease Mother        also hypertension and asthma   Cancer Mother    Colon cancer Neg Hx    Colon polyps Neg Hx     Social History   Tobacco Use   Smoking status: Former    Packs/day: 1.00    Years: 18.00    Pack years: 18.00    Types: Cigarettes    Start date: 06/02/1965    Quit date: 06/09/1980    Years since quitting: 40.5   Smokeless tobacco: Never  Vaping Use   Vaping Use: Never used  Substance Use Topics   Alcohol use: No    Alcohol/week: 0.0 standard drinks   Drug use: No    ROS   Objective:   Vitals: BP (!) 141/89 (BP Location: Right Arm)   Pulse 82   Temp 98.2 F (36.8 C) (Tympanic)   Resp 16   SpO2 96%   Physical Exam Constitutional:      General: She is not in acute distress.    Appearance: Normal appearance. She is well-developed. She is not ill-appearing, toxic-appearing or diaphoretic.  HENT:     Head: Normocephalic and atraumatic.     Right Ear: Tympanic membrane and ear canal normal. No drainage or  tenderness. No middle ear effusion. Tympanic membrane is not erythematous.     Left Ear: Tympanic membrane and ear canal normal. No drainage or tenderness.  No middle ear effusion. Tympanic membrane is not erythematous.     Nose: Nose normal. No congestion or rhinorrhea.     Mouth/Throat:     Mouth: Mucous membranes are moist. No oral lesions.     Pharynx: Oropharynx is clear. No pharyngeal swelling, oropharyngeal exudate, posterior oropharyngeal erythema or uvula swelling.     Tonsils: No tonsillar  exudate or tonsillar abscesses.  Eyes:     Extraocular Movements: Extraocular movements intact.     Right eye: Normal extraocular motion.     Left eye: Normal extraocular motion.     Conjunctiva/sclera: Conjunctivae normal.     Pupils: Pupils are equal, round, and reactive to light.  Cardiovascular:     Rate and Rhythm: Normal rate and regular rhythm.     Pulses: Normal pulses.     Heart sounds: Normal heart sounds. No murmur heard.   No friction rub. No gallop.  Pulmonary:     Effort: Pulmonary effort is normal. No respiratory distress.     Breath sounds: Normal breath sounds. No stridor. No wheezing, rhonchi or rales.  Musculoskeletal:     Cervical back: Normal range of motion and neck supple.  Lymphadenopathy:     Cervical: No cervical adenopathy.  Skin:    General: Skin is warm and dry.     Findings: No rash.  Neurological:     General: No focal deficit present.     Mental Status: She is alert and oriented to person, place, and time.  Psychiatric:        Mood and Affect: Mood normal.        Behavior: Behavior normal.        Thought Content: Thought content normal.    Results for orders placed or performed during the hospital encounter of 12/18/20 (from the past 24 hour(s))  POCT rapid strep A     Status: None   Collection Time: 12/18/20  8:29 AM  Result Value Ref Range   Rapid Strep A Screen Negative Negative     Assessment and Plan :   PDMP not reviewed this  encounter.  1. Viral URI with cough   2. Throat pain   3. Type 2 diabetes mellitus treated without insulin ([place])     Patient would be an excellent candidate for COVID antiviral medication surgery tends to be positive.  Otherwise, will manage for viral illness such as viral URI, viral syndrome, viral rhinitis, COVID-19. Counseled patient on nature of COVID-19 including modes of transmission, diagnostic testing, management and supportive care.  Offered scripts for symptomatic relief. COVID 19 testing is pending. Counseled patient on potential for adverse effects with medications prescribed/recommended today, ER and return-to-clinic precautions discussed, patient verbalized understanding.     Jaynee Eagles, [place] 12/18/20 [phone]

## 2020-12-19 LAB — NOVEL CORONAVIRUS, NAA: SARS-CoV-2, NAA: NOT DETECTED

## 2020-12-19 LAB — SARS-COV-2, NAA 2 DAY TAT

## 2020-12-21 LAB — CULTURE, GROUP A STREP (THRC)

## 2020-12-24 ENCOUNTER — Other Ambulatory Visit (HOSPITAL_COMMUNITY): Payer: Self-pay | Admitting: Surgery

## 2020-12-24 DIAGNOSIS — D500 Iron deficiency anemia secondary to blood loss (chronic): Secondary | ICD-10-CM

## 2020-12-24 DIAGNOSIS — D509 Iron deficiency anemia, unspecified: Secondary | ICD-10-CM

## 2020-12-26 ENCOUNTER — Other Ambulatory Visit: Payer: Self-pay

## 2020-12-26 ENCOUNTER — Inpatient Hospital Stay (HOSPITAL_COMMUNITY): Payer: PPO | Attending: Hematology

## 2020-12-26 DIAGNOSIS — E559 Vitamin D deficiency, unspecified: Secondary | ICD-10-CM | POA: Insufficient documentation

## 2020-12-26 DIAGNOSIS — D500 Iron deficiency anemia secondary to blood loss (chronic): Secondary | ICD-10-CM

## 2020-12-26 DIAGNOSIS — R921 Mammographic calcification found on diagnostic imaging of breast: Secondary | ICD-10-CM | POA: Insufficient documentation

## 2020-12-26 DIAGNOSIS — D509 Iron deficiency anemia, unspecified: Secondary | ICD-10-CM | POA: Diagnosis not present

## 2020-12-26 LAB — COMPREHENSIVE METABOLIC PANEL
ALT: 15 U/L (ref 0–44)
AST: 21 U/L (ref 15–41)
Albumin: 4 g/dL (ref 3.5–5.0)
Alkaline Phosphatase: 58 U/L (ref 38–126)
Anion gap: 8 (ref 5–15)
BUN: 23 mg/dL (ref 8–23)
CO2: 29 mmol/L (ref 22–32)
Calcium: 9.2 mg/dL (ref 8.9–10.3)
Chloride: 101 mmol/L (ref 98–111)
Creatinine, Ser: 1.16 mg/dL — ABNORMAL HIGH (ref 0.44–1.00)
GFR, Estimated: 48 mL/min — ABNORMAL LOW (ref >60)
Glucose, Bld: 109 mg/dL — ABNORMAL HIGH (ref 70–99)
Potassium: 4.2 mmol/L (ref 3.5–5.1)
Sodium: 138 mmol/L (ref 135–145)
Total Bilirubin: 0.7 mg/dL (ref 0.3–1.2)
Total Protein: 7 g/dL (ref 6.5–8.1)

## 2020-12-26 LAB — CBC WITH DIFFERENTIAL/PLATELET
Abs Immature Granulocytes: 0.07 10*3/uL (ref 0.00–0.07)
Basophils Absolute: 0 10*3/uL (ref 0.0–0.1)
Basophils Relative: 0 %
Eosinophils Absolute: 0.3 10*3/uL (ref 0.0–0.5)
Eosinophils Relative: 3 %
HCT: 40.5 % (ref 36.0–46.0)
Hemoglobin: 12.5 g/dL (ref 12.0–15.0)
Immature Granulocytes: 1 %
Lymphocytes Relative: 17 %
Lymphs Abs: 1.4 10*3/uL (ref 0.7–4.0)
MCH: 24.5 pg — ABNORMAL LOW (ref 26.0–34.0)
MCHC: 30.9 g/dL (ref 30.0–36.0)
MCV: 79.3 fL — ABNORMAL LOW (ref 80.0–100.0)
Monocytes Absolute: 0.7 10*3/uL (ref 0.1–1.0)
Monocytes Relative: 9 %
Neutro Abs: 5.9 10*3/uL (ref 1.7–7.7)
Neutrophils Relative %: 70 %
Platelets: 280 10*3/uL (ref 150–400)
RBC: 5.11 MIL/uL (ref 3.87–5.11)
RDW: 15.6 % — ABNORMAL HIGH (ref 11.5–15.5)
WBC: 8.4 10*3/uL (ref 4.0–10.5)
nRBC: 0 % (ref 0.0–0.2)

## 2020-12-26 LAB — IRON AND TIBC
Iron: 50 ug/dL (ref 28–170)
Saturation Ratios: 13 % (ref 10.4–31.8)
TIBC: 397 ug/dL (ref 250–450)
UIBC: 347 ug/dL

## 2020-12-26 LAB — VITAMIN B12: Vitamin B-12: 424 pg/mL (ref 180–914)

## 2020-12-26 LAB — VITAMIN D 25 HYDROXY (VIT D DEFICIENCY, FRACTURES): Vit D, 25-Hydroxy: 97.87 ng/mL (ref 30–100)

## 2020-12-26 LAB — FERRITIN: Ferritin: 28 ng/mL (ref 11–307)

## 2020-12-30 DIAGNOSIS — E119 Type 2 diabetes mellitus without complications: Secondary | ICD-10-CM | POA: Diagnosis not present

## 2020-12-30 DIAGNOSIS — E7849 Other hyperlipidemia: Secondary | ICD-10-CM | POA: Diagnosis not present

## 2020-12-30 DIAGNOSIS — I10 Essential (primary) hypertension: Secondary | ICD-10-CM | POA: Diagnosis not present

## 2020-12-30 DIAGNOSIS — K219 Gastro-esophageal reflux disease without esophagitis: Secondary | ICD-10-CM | POA: Diagnosis not present

## 2020-12-31 ENCOUNTER — Other Ambulatory Visit (HOSPITAL_COMMUNITY): Payer: Self-pay | Admitting: Family Medicine

## 2020-12-31 DIAGNOSIS — R921 Mammographic calcification found on diagnostic imaging of breast: Secondary | ICD-10-CM

## 2021-01-01 ENCOUNTER — Ambulatory Visit (HOSPITAL_COMMUNITY)
Admission: RE | Admit: 2021-01-01 | Discharge: 2021-01-01 | Disposition: A | Discharge status: Home / Self Care | Payer: PPO | Source: Ambulatory Visit | Attending: Family Medicine | Admitting: Family Medicine

## 2021-01-01 ENCOUNTER — Other Ambulatory Visit: Payer: Self-pay

## 2021-01-01 ENCOUNTER — Other Ambulatory Visit (HOSPITAL_COMMUNITY): Payer: Self-pay | Admitting: Family Medicine

## 2021-01-01 DIAGNOSIS — R921 Mammographic calcification found on diagnostic imaging of breast: Secondary | ICD-10-CM

## 2021-01-01 DIAGNOSIS — R922 Inconclusive mammogram: Secondary | ICD-10-CM | POA: Diagnosis not present

## 2021-01-01 DIAGNOSIS — R928 Other abnormal and inconclusive findings on diagnostic imaging of breast: Secondary | ICD-10-CM

## 2021-01-01 NOTE — Progress Notes (Signed)
Virtual Visit via Telephone Note [facility]  I connected with Nancy Liu on 01/02/21 at 12:32 PM  by telephone and verified that I am speaking with the correct person using two identifiers.  Location: Patient: Home Provider: [facility]   I discussed the limitations, risks, security and privacy concerns of performing an evaluation and management service by telephone and the availability of in person appointments. I also discussed with the patient that there may be a patient responsible charge related to this service. The patient expressed understanding and agreed to proceed.   History of Present Illness: [name] follows with [facility] for microcytic anemia secondary to iron deficiency and suspected thalassemia minor.  She was last seen by NP Faythe Casa on 06/28/2020 via telemedicine visit.  At today's visit, she reports that she feels fair.  She denies any recent hospitalizations, surgeries, or changes in her baseline health status.  Patient reports that she had a mammogram yesterday with some suspicious abnormalities (indeterminate calcifications and small circumscribed masses in segmental distribution of the medial portion of the right breast).  She reports that she is going back for biopsy next week.  Patient reports that this is causing her some concern and anxiety, as her mother passed away from breast cancer.  Regarding her anemia, the patient has been taking ferrous sulfate 325 mg daily at home, which she has been tolerating well.  She reports mild constipation, and is taking senna for relief.  She reports that she is still taking her vitamin D once per week.  She reports that she has chronically dark stool secondary to her vitamin and mineral supplements, but she denies any hematemesis, epistaxis, melena, or hematochezia.  She has ongoing fatigue, rates her energy is about 60%.  She denies any chest pain, dyspnea on  exertion, syncope, palpitations.  No B symptoms such as fever, chills, night sweats, unintentional weight loss.  She reports energy at 60%, with appetite at 60%.  She is maintaining stable weight at this time.    Observations/Objective: Review of Systems  Constitutional:  Positive for malaise/fatigue. Negative for chills, diaphoresis, fever and weight loss.  Respiratory:  Negative for cough and shortness of breath.   Cardiovascular:  Negative for chest pain and palpitations.  Gastrointestinal:  Negative for abdominal pain, blood in stool, melena, nausea and vomiting.  Neurological:  Positive for tingling. Negative for dizziness and headaches.    PHYSICAL EXAM (per limitations of virtual telephone visit): The patient is alert and oriented x 3, exhibiting adequate mentation, good mood, and ability to speak in full sentences and execute sound judgement.   ASSESSMENT & PLAN: 1.  Microcytic anemia: - This is felt secondary to thalassemia minor and iron deficiency.  Patient has no M spike on SPEP. - EGD (02/28/2020): Gastritis, otherwise normal - Colonoscopy (03/03/2016): Diverticulosis, nonbleeding internal hemorrhoids - She received Feraheme on 12/08/2016. - At last visit, she was noted to have normal hemoglobin but mildly decreased iron storage, therefore started on daily iron supplementation with ferrous sulfate  - She denies any bright red bleeding per rectum or melena.   - Patient states she is feeling better but still tired.   - Most recent labs (12/26/2020): Normal Hgb 12.5, MCV 79.3, ferritin 28, iron saturation 13% - PLAN: Due to persistent fatigue and persistent iron deficiency despite oral iron supplementation, recommend IV iron (Feraheme x2).  We will repeat labs and check hemoglobin electrophoresis in 4 months with visit  the following week.   2.  Vitamin D deficiency: - She was previously taking vitamin D 50,000 units weekly, but was noted at her last visit to have elevated vitamin  D - She was told to stop taking her ergocalciferol and start taking multivitamin with calcium and vitamin D - however, patient reports that she has still been taking it once a week. - Most recent labs (12/26/2020): With normal vitamin D 97.87 - PLAN: Discussed with patient, and she will cut her vitamin D back to every other week.  We will recheck at follow-up visit in 4 months.   3.  Abnormal mammogram. - Mammogram on 01/01/2021 showed suspicious abnormalities: Indeterminate calcifications and small circumscribed masses in segmental distribution of the medial portion of the right breast - She reports that she is going back for biopsy next week. - Patient reports that this is causing her some concern and anxiety, as her mother passed away from breast cancer.   Follow Up Instructions: - Feraheme x2. - Labs in 4 months. - Follow-up visit after labs (can be phone or in person, depending on patient preference)    I discussed the assessment and treatment plan with the patient. The patient was provided an opportunity to ask questions and all were answered. The patient agreed with the plan and demonstrated an understanding of the instructions.   The patient was advised to call back or seek an in-person evaluation if the symptoms worsen or if the condition fails to improve as anticipated.  I provided 12 minutes of non-face-to-face time during this encounter.  Harriett Rush, PA-C 01/02/2021 1:11 PM

## 2021-01-02 ENCOUNTER — Inpatient Hospital Stay (HOSPITAL_COMMUNITY): Payer: PPO | Attending: Hematology | Admitting: Physician Assistant

## 2021-01-02 DIAGNOSIS — R928 Other abnormal and inconclusive findings on diagnostic imaging of breast: Secondary | ICD-10-CM | POA: Insufficient documentation

## 2021-01-02 DIAGNOSIS — D500 Iron deficiency anemia secondary to blood loss (chronic): Secondary | ICD-10-CM

## 2021-01-02 DIAGNOSIS — D509 Iron deficiency anemia, unspecified: Secondary | ICD-10-CM | POA: Diagnosis not present

## 2021-01-02 DIAGNOSIS — E559 Vitamin D deficiency, unspecified: Secondary | ICD-10-CM | POA: Insufficient documentation

## 2021-01-04 ENCOUNTER — Other Ambulatory Visit: Payer: Self-pay

## 2021-01-04 ENCOUNTER — Inpatient Hospital Stay (HOSPITAL_COMMUNITY): Payer: PPO

## 2021-01-04 VITALS — BP 116/62 | HR 73 | Temp 96.8°F | Resp 18

## 2021-01-04 DIAGNOSIS — D500 Iron deficiency anemia secondary to blood loss (chronic): Secondary | ICD-10-CM

## 2021-01-04 DIAGNOSIS — D509 Iron deficiency anemia, unspecified: Secondary | ICD-10-CM

## 2021-01-04 DIAGNOSIS — R928 Other abnormal and inconclusive findings on diagnostic imaging of breast: Secondary | ICD-10-CM | POA: Diagnosis not present

## 2021-01-04 DIAGNOSIS — E559 Vitamin D deficiency, unspecified: Secondary | ICD-10-CM | POA: Diagnosis not present

## 2021-01-04 MED ORDER — SODIUM CHLORIDE 0.9 % IV SOLN: Freq: Once | INTRAVENOUS | Status: AC

## 2021-01-04 MED ORDER — SODIUM CHLORIDE 0.9 % IV SOLN
510.0000 mg | Freq: Once | INTRAVENOUS | Status: AC
  Administered 2021-01-04: 510 mg via INTRAVENOUS
  Filled 2021-01-04: qty 510

## 2021-01-04 MED ORDER — ACETAMINOPHEN 325 MG PO TABS
650.0000 mg | ORAL_TABLET | Freq: Once | ORAL | Status: AC
  Administered 2021-01-04: 650 mg via ORAL
  Filled 2021-01-04: qty 2

## 2021-01-04 MED ORDER — LORATADINE 10 MG PO TABS
10.0000 mg | ORAL_TABLET | Freq: Once | ORAL | Status: AC
  Administered 2021-01-04: 10 mg via ORAL
  Filled 2021-01-04: qty 1

## 2021-01-04 NOTE — Patient Instructions (Signed)
[facility]  Discharge Instructions: Thank you for choosing [facility] to provide your oncology and hematology care.  If you have a lab appointment with the Cancer Center, please come in thru the Main Entrance and check in at the main information desk.  We strive to give you quality time with your provider. You may need to reschedule your appointment if you arrive late (15 or more minutes).  Arriving late affects you and other patients whose appointments are after yours.  Also, if you miss three or more appointments without notifying the office, you may be dismissed from the clinic at the provider's discretion.      For prescription refill requests, have your pharmacy contact our office and allow 72 hours for refills to be completed.     To help prevent nausea and vomiting after your treatment, we encourage you to take your nausea medication as directed.  BELOW ARE SYMPTOMS THAT SHOULD BE REPORTED IMMEDIATELY: *FEVER GREATER THAN 100.4 F (38 C) OR HIGHER *CHILLS OR SWEATING *NAUSEA AND VOMITING THAT IS NOT CONTROLLED WITH YOUR NAUSEA MEDICATION *UNUSUAL SHORTNESS OF BREATH *UNUSUAL BRUISING OR BLEEDING *URINARY PROBLEMS (pain or burning when urinating, or frequent urination) *BOWEL PROBLEMS (unusual diarrhea, constipation, pain near the anus) TENDERNESS IN MOUTH AND THROAT WITH OR WITHOUT PRESENCE OF ULCERS (sore throat, sores in mouth, or a toothache) UNUSUAL RASH, SWELLING OR PAIN  UNUSUAL VAGINAL DISCHARGE OR ITCHING   Items with * indicate a potential emergency and should be followed up as soon as possible or go to the Emergency Department if any problems should occur.  Should you have questions after your visit or need to cancel or reschedule your appointment, please contact [facility] [phone]  and follow the prompts.  Office hours are 8:00 a.m. to 4:30 p.m. Monday - Friday. Please note that voicemails left after 4:00 p.m. may not be  returned until the following business day.  We are closed weekends and major holidays. You have access to a nurse at all times for urgent questions. Please call the main number to the clinic [phone] and follow the prompts.  For any non-urgent questions, you may also contact your provider using MyChart. We now offer e-Visits for anyone 18 and older to request care online for non-urgent symptoms. For details visit [email or web address].   Also download the MyChart app! Go to the app store, search "MyChart", open the app, select [place], and log in with your MyChart username and password.  Due to Covid, a mask is required upon entering the hospital/clinic. If you do not have a mask, one will be given to you upon arrival. For doctor visits, patients may have 1 support person aged 18 or older with them. For treatment visits, patients cannot have anyone with them due to current Covid guidelines and our immunocompromised population.  

## 2021-01-04 NOTE — Progress Notes (Signed)
Patient presents today for iron infusion per orders.  Patient tolerated infusion well with no complaints voiced.  Patient left ambulatory in stable condition.  Vital signs stable at discharge.  Follow up as scheduled.

## 2021-01-11 ENCOUNTER — Other Ambulatory Visit: Payer: Self-pay

## 2021-01-11 ENCOUNTER — Inpatient Hospital Stay (HOSPITAL_COMMUNITY): Payer: PPO

## 2021-01-11 VITALS — BP 133/75 | HR 78 | Temp 98.3°F | Resp 18

## 2021-01-11 DIAGNOSIS — D509 Iron deficiency anemia, unspecified: Secondary | ICD-10-CM

## 2021-01-11 DIAGNOSIS — D500 Iron deficiency anemia secondary to blood loss (chronic): Secondary | ICD-10-CM

## 2021-01-11 MED ORDER — LORATADINE 10 MG PO TABS
10.0000 mg | ORAL_TABLET | Freq: Once | ORAL | Status: AC
  Administered 2021-01-11: 10 mg via ORAL
  Filled 2021-01-11: qty 1

## 2021-01-11 MED ORDER — ACETAMINOPHEN 325 MG PO TABS
650.0000 mg | ORAL_TABLET | Freq: Once | ORAL | Status: AC
  Administered 2021-01-11: 650 mg via ORAL
  Filled 2021-01-11: qty 2

## 2021-01-11 MED ORDER — SODIUM CHLORIDE 0.9 % IV SOLN
510.0000 mg | Freq: Once | INTRAVENOUS | Status: AC
  Administered 2021-01-11: 510 mg via INTRAVENOUS
  Filled 2021-01-11: qty 510

## 2021-01-11 MED ORDER — SODIUM CHLORIDE 0.9 % IV SOLN: Freq: Once | INTRAVENOUS | Status: AC

## 2021-01-11 NOTE — Progress Notes (Signed)
Patient presents today for Feraheme infusion per providers order.  Vital signs WNL.  Patient has no new complaints since last visit.  Peripheral IV started and blood return noted pre and post infusion.  Feraheme infusion given today per MD orders.  Stable during infusion without adverse affects.  Vital signs stable.  No complaints at this time.  Discharge from clinic ambulatory in stable condition.  Alert and oriented X 3.  Follow up with [facility] as scheduled.

## 2021-01-11 NOTE — Patient Instructions (Signed)
[facility]  Discharge Instructions: °Thank you for choosing [facility] to provide your oncology and hematology care.  °If you have a lab appointment with the Cancer Center, please come in thru the Main Entrance and check in at the main information desk. ° °Wear comfortable clothing and clothing appropriate for easy access to any Portacath or PICC line.  ° °We strive to give you quality time with your provider. You may need to reschedule your appointment if you arrive late (15 or more minutes).  Arriving late affects you and other patients whose appointments are after yours.  Also, if you miss three or more appointments without notifying the office, you may be dismissed from the clinic at the provider’s discretion.    °  °For prescription refill requests, have your pharmacy contact our office and allow 72 hours for refills to be completed.   ° °Today you received the following chemotherapy and/or immunotherapy agents Feraheme    °  °To help prevent nausea and vomiting after your treatment, we encourage you to take your nausea medication as directed. ° °BELOW ARE SYMPTOMS THAT SHOULD BE REPORTED IMMEDIATELY: °*FEVER GREATER THAN 100.4 F (38 °C) OR HIGHER °*CHILLS OR SWEATING °*NAUSEA AND VOMITING THAT IS NOT CONTROLLED WITH YOUR NAUSEA MEDICATION °*UNUSUAL SHORTNESS OF BREATH °*UNUSUAL BRUISING OR BLEEDING °*URINARY PROBLEMS (pain or burning when urinating, or frequent urination) °*BOWEL PROBLEMS (unusual diarrhea, constipation, pain near the anus) °TENDERNESS IN MOUTH AND THROAT WITH OR WITHOUT PRESENCE OF ULCERS (sore throat, sores in mouth, or a toothache) °UNUSUAL RASH, SWELLING OR PAIN  °UNUSUAL VAGINAL DISCHARGE OR ITCHING  ° °Items with * indicate a potential emergency and should be followed up as soon as possible or go to the Emergency Department if any problems should occur. ° °Please show the CHEMOTHERAPY ALERT CARD or IMMUNOTHERAPY ALERT CARD at check-in to the Emergency  Department and triage nurse. ° °Should you have questions after your visit or need to cancel or reschedule your appointment, please contact [facility] [phone]  and follow the prompts.  Office hours are 8:00 a.m. to 4:30 p.m. Monday - Friday. Please note that voicemails left after 4:00 p.m. may not be returned until the following business day.  We are closed weekends and major holidays. You have access to a nurse at all times for urgent questions. Please call the main number to the clinic [phone] and follow the prompts. ° °For any non-urgent questions, you may also contact your provider using MyChart. We now offer e-Visits for anyone 18 and older to request care online for non-urgent symptoms. For details visit [email or web address]. °  °Also download the MyChart app! Go to the app store, search "MyChart", open the app, select [place], and log in with your MyChart username and password. ° °Due to Covid, a mask is required upon entering the hospital/clinic. If you do not have a mask, one will be given to you upon arrival. For doctor visits, patients may have 1 support person aged 18 or older with them. For treatment visits, patients cannot have anyone with them due to current Covid guidelines and our immunocompromised population.  °

## 2021-01-14 ENCOUNTER — Ambulatory Visit
Admission: RE | Admit: 2021-01-14 | Discharge: 2021-01-14 | Disposition: A | Discharge status: Home / Self Care | Payer: PPO | Source: Ambulatory Visit | Attending: Family Medicine | Admitting: Family Medicine

## 2021-01-14 ENCOUNTER — Other Ambulatory Visit: Payer: Self-pay

## 2021-01-14 ENCOUNTER — Other Ambulatory Visit (HOSPITAL_COMMUNITY): Payer: Self-pay | Admitting: Family Medicine

## 2021-01-14 DIAGNOSIS — D0511 Intraductal carcinoma in situ of right breast: Secondary | ICD-10-CM | POA: Diagnosis not present

## 2021-01-14 DIAGNOSIS — R928 Other abnormal and inconclusive findings on diagnostic imaging of breast: Secondary | ICD-10-CM

## 2021-01-14 DIAGNOSIS — R921 Mammographic calcification found on diagnostic imaging of breast: Secondary | ICD-10-CM

## 2021-01-14 DIAGNOSIS — N6312 Unspecified lump in the right breast, upper inner quadrant: Secondary | ICD-10-CM | POA: Diagnosis not present

## 2021-01-17 ENCOUNTER — Encounter: Payer: Self-pay | Admitting: *Deleted

## 2021-01-17 DIAGNOSIS — D0511 Intraductal carcinoma in situ of right breast: Secondary | ICD-10-CM | POA: Insufficient documentation

## 2021-01-23 ENCOUNTER — Ambulatory Visit
Admission: RE | Admit: 2021-01-23 | Discharge: 2021-01-23 | Disposition: A | Discharge status: Home / Self Care | Payer: PPO | Source: Ambulatory Visit | Attending: Radiation Oncology | Admitting: Radiation Oncology

## 2021-01-23 ENCOUNTER — Inpatient Hospital Stay: Payer: PPO

## 2021-01-23 ENCOUNTER — Inpatient Hospital Stay: Payer: PPO | Admitting: Licensed Clinical Social Worker

## 2021-01-23 ENCOUNTER — Ambulatory Visit (HOSPITAL_BASED_OUTPATIENT_CLINIC_OR_DEPARTMENT_OTHER): Payer: PPO | Admitting: Genetic Counselor

## 2021-01-23 ENCOUNTER — Encounter: Payer: Self-pay | Admitting: Genetic Counselor

## 2021-01-23 ENCOUNTER — Other Ambulatory Visit: Payer: Self-pay

## 2021-01-23 ENCOUNTER — Ambulatory Visit: Payer: PPO | Admitting: Physical Therapy

## 2021-01-23 ENCOUNTER — Encounter: Payer: Self-pay | Admitting: Hematology

## 2021-01-23 ENCOUNTER — Inpatient Hospital Stay: Payer: PPO | Attending: Hematology | Admitting: Hematology

## 2021-01-23 VITALS — BP 142/80 | HR 79 | Temp 97.9°F | Resp 18 | Ht 64.0 in | Wt 150.2 lb

## 2021-01-23 DIAGNOSIS — Z803 Family history of malignant neoplasm of breast: Secondary | ICD-10-CM

## 2021-01-23 DIAGNOSIS — E2839 Other primary ovarian failure: Secondary | ICD-10-CM | POA: Insufficient documentation

## 2021-01-23 DIAGNOSIS — D0511 Intraductal carcinoma in situ of right breast: Secondary | ICD-10-CM

## 2021-01-23 DIAGNOSIS — Z7982 Long term (current) use of aspirin: Secondary | ICD-10-CM | POA: Diagnosis not present

## 2021-01-23 DIAGNOSIS — Z801 Family history of malignant neoplasm of trachea, bronchus and lung: Secondary | ICD-10-CM | POA: Diagnosis not present

## 2021-01-23 DIAGNOSIS — Z8249 Family history of ischemic heart disease and other diseases of the circulatory system: Secondary | ICD-10-CM | POA: Insufficient documentation

## 2021-01-23 DIAGNOSIS — Z7984 Long term (current) use of oral hypoglycemic drugs: Secondary | ICD-10-CM | POA: Insufficient documentation

## 2021-01-23 DIAGNOSIS — Z8041 Family history of malignant neoplasm of ovary: Secondary | ICD-10-CM

## 2021-01-23 DIAGNOSIS — E785 Hyperlipidemia, unspecified: Secondary | ICD-10-CM | POA: Diagnosis not present

## 2021-01-23 DIAGNOSIS — E119 Type 2 diabetes mellitus without complications: Secondary | ICD-10-CM | POA: Diagnosis not present

## 2021-01-23 DIAGNOSIS — Z79899 Other long term (current) drug therapy: Secondary | ICD-10-CM | POA: Insufficient documentation

## 2021-01-23 DIAGNOSIS — I10 Essential (primary) hypertension: Secondary | ICD-10-CM | POA: Insufficient documentation

## 2021-01-23 DIAGNOSIS — Z87891 Personal history of nicotine dependence: Secondary | ICD-10-CM | POA: Insufficient documentation

## 2021-01-23 HISTORY — DX: Family history of malignant neoplasm of breast: Z80.3

## 2021-01-23 HISTORY — DX: Family history of malignant neoplasm of ovary: Z80.41

## 2021-01-23 LAB — CBC WITH DIFFERENTIAL (CANCER CENTER ONLY)
Abs Immature Granulocytes: 0.05 10*3/uL (ref 0.00–0.07)
Basophils Absolute: 0 10*3/uL (ref 0.0–0.1)
Basophils Relative: 0 %
Eosinophils Absolute: 0.2 10*3/uL (ref 0.0–0.5)
Eosinophils Relative: 2 %
HCT: 41.1 % (ref 36.0–46.0)
Hemoglobin: 12.9 g/dL (ref 12.0–15.0)
Immature Granulocytes: 1 %
Lymphocytes Relative: 12 %
Lymphs Abs: 1.3 10*3/uL (ref 0.7–4.0)
MCH: 24.5 pg — ABNORMAL LOW (ref 26.0–34.0)
MCHC: 31.4 g/dL (ref 30.0–36.0)
MCV: 78 fL — ABNORMAL LOW (ref 80.0–100.0)
Monocytes Absolute: 0.8 10*3/uL (ref 0.1–1.0)
Monocytes Relative: 7 %
Neutro Abs: 8.5 10*3/uL — ABNORMAL HIGH (ref 1.7–7.7)
Neutrophils Relative %: 78 %
Platelet Count: 264 10*3/uL (ref 150–400)
RBC: 5.27 MIL/uL — ABNORMAL HIGH (ref 3.87–5.11)
RDW: 16.1 % — ABNORMAL HIGH (ref 11.5–15.5)
WBC Count: 10.9 10*3/uL — ABNORMAL HIGH (ref 4.0–10.5)
nRBC: 0 % (ref 0.0–0.2)

## 2021-01-23 LAB — CMP (CANCER CENTER ONLY)
ALT: 17 U/L (ref 0–44)
AST: 24 U/L (ref 15–41)
Albumin: 4.4 g/dL (ref 3.5–5.0)
Alkaline Phosphatase: 65 U/L (ref 38–126)
Anion gap: 12 (ref 5–15)
BUN: 21 mg/dL (ref 8–23)
CO2: 29 mmol/L (ref 22–32)
Calcium: 10 mg/dL (ref 8.9–10.3)
Chloride: 101 mmol/L (ref 98–111)
Creatinine: 1.22 mg/dL — ABNORMAL HIGH (ref 0.44–1.00)
GFR, Estimated: 45 mL/min — ABNORMAL LOW (ref >60)
Glucose, Bld: 112 mg/dL — ABNORMAL HIGH (ref 70–99)
Potassium: 4.2 mmol/L (ref 3.5–5.1)
Sodium: 142 mmol/L (ref 135–145)
Total Bilirubin: 0.6 mg/dL (ref 0.3–1.2)
Total Protein: 7.4 g/dL (ref 6.5–8.1)

## 2021-01-23 LAB — GENETIC SCREENING ORDER

## 2021-01-23 NOTE — Progress Notes (Signed)
Radiation Oncology         [phone] ________________________________  Name: Nancy Liu        MRN: [id number]  Date of Service: 01/23/2021 DOB: Apr 26, 1941  [id number], Myra Rude, MD  Rolm Bookbinder, MD     REFERRING PHYSICIAN: Rolm Bookbinder, MD   DIAGNOSIS: The encounter diagnosis was Ductal carcinoma in situ (DCIS) of right breast.   HISTORY OF PRESENT ILLNESS: Nancy Liu is a 80 y.o. female seen in the multidisciplinary breast clinic for a new diagnosis of right breast cancer. The patient was noted to have a history of calcifications and repeat diagnostic imaging showed an 8.9 cm span of calcifications in the right breast as well as a mass measuring 4.2 cm. She underwent a stereotactic biopsy on 01/14/21 that showed in both anterior and posterior specimens an intermediate grade DCIS that was ER/PR positive. She is seen today to discuss treatment recommendations of her cancer.    PREVIOUS RADIATION THERAPY: No   PAST MEDICAL HISTORY:  Past Medical History:  Diagnosis Date   Arteriosclerotic cardiovascular disease (ASCVD) 2001   Bare-metal stent placed in the right coronary artery in 12/01; residual 50% lesion of the first diagonal and mid LAD   Cerebrovascular disease    Cyst, dermoid, arm, left 03/26/2018   Diabetes mellitus    excellent control with a low-dose of a single oral agent   GERD (gastroesophageal reflux disease)    with ulcers   History of right coronary artery stent placement 2000   Hyperlipidemia    Hypertension    Thalassemia minor    Tobacco abuse, in remission    35 pack years; Quit in 1980       PAST SURGICAL HISTORY: Past Surgical History:  Procedure Laterality Date   BIOPSY  02/28/2020   Procedure: BIOPSY;  Surgeon: Eloise Harman, DO;  [facility];  Service: Endoscopy;;   COLONOSCOPY  2006   Dr. Collene Mares: normal   COLONOSCOPY  2000   Dr. Everlean Cherry: normal    COLONOSCOPY N/A 03/03/2016   Dr. Oneida Alar: diverticulosis,  non-bleeding hemorrhoids, redundant left colon.   DILATION AND CURETTAGE OF UTERUS     ESOPHAGOGASTRODUODENOSCOPY  2000   Dr. Everlean Cherry: gastritis    ESOPHAGOGASTRODUODENOSCOPY  2006   Dr. Collene Mares: small hiatal hernia, gastritis, negative H.pylori    ESOPHAGOGASTRODUODENOSCOPY N/A 03/03/2016   Procedure: ESOPHAGOGASTRODUODENOSCOPY (EGD);  Surgeon: Danie Binder, MD;  Location: [facility];  Service: Endoscopy;  Laterality: N/A;   ESOPHAGOGASTRODUODENOSCOPY N/A 06/03/2016   Dr. Oneida Alar: nonaggressive gastritis due to aspirin use. Previous ulcers had healed.   ESOPHAGOGASTRODUODENOSCOPY (EGD) WITH PROPOFOL N/A 02/28/2020   focal inflammation in the gastric antrum consistent with gastritis on biopsies. No H.pylori.    EXCISION OF KELOID Left 03/26/2018   Procedure: EXCISION OF LEFT ARM MASS;  Surgeon: Fanny Skates, MD;  Location: [facility];  Service: General;  Laterality: Left;   GIVENS CAPSULE STUDY N/A 10/10/2020   Procedure: GIVENS CAPSULE STUDY;  Surgeon: Eloise Harman, DO;  Location: [facility];  Service: Endoscopy;  Laterality: N/A;  7:30am   INGUINAL HERNIA REPAIR  1970s   Right   VAGINAL HYSTERECTOMY  1980   Unilateral oophorectomy     FAMILY HISTORY:  Family History  Problem Relation Age of Onset   Heart disease Mother        also hypertension and asthma   Cancer Mother    Diabetes Sister    Breast cancer Sister  Colon cancer Neg Hx    Colon polyps Neg Hx      SOCIAL HISTORY:  reports that she quit smoking about 40 years ago. Her smoking use included cigarettes. She started smoking about 55 years ago. She has a 18.00 pack-year smoking history. She has never used smokeless tobacco. She reports that she does not drink alcohol and does not use drugs.The patient is divorced and lives in [place]. She's accompanied by her daughter Karie Kirks. Her daughter [name] joins [place] on speakerphone.   ALLERGIES: Protonix [pantoprazole sodium] and Shellfish  allergy   MEDICATIONS:  Current Outpatient Medications  Medication Sig Dispense Refill   amLODipine (NORVASC) 10 MG tablet Take 1 tablet (10 mg total) by mouth daily. 90 tablet 3   aspirin EC 81 MG tablet Take 81 mg by mouth daily. Swallow whole.     augmented betamethasone dipropionate (DIPROLENE-AF) 0.05 % ointment Apply 1 application topically 2 (two) times daily as needed (skin itching/dryness).     benzonatate (TESSALON) 100 MG capsule Take 1-2 capsules (100-200 mg total) by mouth 3 (three) times daily as needed. 60 capsule 0   carvedilol (COREG) 6.25 MG tablet TAKE ONE TABLET BY MOUTH BEFORE BREAKFAST and TAKE ONE TABLET BY MOUTH EVERYDAY AT BEDTIME 180 tablet 3   cetirizine (ZYRTEC) 5 MG tablet Take 1 tablet (5 mg total) by mouth daily. 30 tablet 0   COQ10 MAXIMUM STRENGTH 400 MG CAPS Take 400 mg by mouth every morning.     doxepin (SINEQUAN) 10 MG capsule Take 10 mg by mouth at bedtime.     ergocalciferol (VITAMIN D2) 1.25 MG (50000 UT) capsule Take 1 capsule (50,000 Units total) by mouth once a week. 16 capsule 3   ferrous sulfate 325 (65 FE) MG tablet Take 325 mg by mouth daily.      irbesartan-hydrochlorothiazide (AVALIDE) 300-12.5 MG tablet Take 1 tablet by mouth daily.      lansoprazole (PREVACID) 30 MG capsule Take 1 capsule (30 mg total) by mouth 2 (two) times daily before a meal. 30 capsule 5   Latanoprostene Bunod 0.024 % SOLN Place 1 drop into both eyes See admin instructions. Instill 1 drop into both eyes 3 times a week at bedtime.     metFORMIN (GLUCOPHAGE) 1000 MG tablet Take 500 mg by mouth daily.      nitroGLYCERIN (NITROSTAT) 0.4 MG SL tablet Place 1 tablet (0.4 mg total) under the tongue every 5 (five) minutes as needed for chest pain. 25 tablet 3   rosuvastatin (CRESTOR) 20 MG tablet TAKE ONE TABLET BY MOUTH EVERYDAY AT BEDTIME 90 tablet 3   Sennosides-Docusate Sodium (SENNA S PO) Take 2 tablets by mouth as needed.     No current facility-administered medications for  this encounter.     REVIEW OF SYSTEMS: On review of systems, the patient reports that she is doing well. She does have heartburn, wears glasses and can have blurriness in her vision. She does have a history of heart disease and a stent but is not having shortness of breath or chest pain currently. She denies any breast pain, bleeding, or concerns with bruising from her biopsy. No other complaints are noted.     PHYSICAL EXAM:  Wt Readings from Last 3 Encounters:  11/30/20 151 lb (68.5 kg)  11/14/20 151 lb (68.5 kg)  10/03/20 140 lb (63.5 kg)   Temp Readings from Last 3 Encounters:  01/11/21 98.3 F (36.8 C) (Temporal)  01/04/21 (!) 96.8 F (36 C) (Temporal)  12/18/20 98.2  F (36.8 C) (Tympanic)   BP Readings from Last 3 Encounters:  01/11/21 133/75  01/04/21 116/62  12/18/20 (!) 141/89   Pulse Readings from Last 3 Encounters:  01/11/21 78  01/04/21 73  12/18/20 82    In general this is a well appearing African American female in no acute distress. She's alert and oriented x4 and appropriate throughout the examination. Cardiopulmonary assessment is negative for acute distress and she exhibits normal effort. Bilateral breast exam is deferred.    ECOG = 0  0 - Asymptomatic (Fully active, able to carry on all predisease activities without restriction)  1 - Symptomatic but completely ambulatory (Restricted in physically strenuous activity but ambulatory and able to carry out work of a light or sedentary nature. For example, light housework, office work)  2 - Symptomatic, <50% in bed during the day (Ambulatory and capable of all self care but unable to carry out any work activities. Up and about more than 50% of waking hours)  3 - Symptomatic, >50% in bed, but not bedbound (Capable of only limited self-care, confined to bed or chair 50% or more of waking hours)  4 - Bedbound (Completely disabled. Cannot carry on any self-care. Totally confined to bed or chair)  5 -  Death   Eustace Pen MM, Creech RH, Tormey DC, et al. [phone]). "Toxicity and response criteria of the [facility]". Springer Oncol. 5 (6): 649-55    LABORATORY DATA:  Lab Results  Component Value Date   WBC 8.4 12/26/2020   HGB 12.5 12/26/2020   HCT 40.5 12/26/2020   MCV 79.3 (L) 12/26/2020   PLT 280 12/26/2020   Lab Results  Component Value Date   NA 138 12/26/2020   K 4.2 12/26/2020   CL 101 12/26/2020   CO2 29 12/26/2020   Lab Results  Component Value Date   ALT 15 12/26/2020   AST 21 12/26/2020   ALKPHOS 58 12/26/2020   BILITOT 0.7 12/26/2020      RADIOGRAPHY: MM DIAG BREAST TOMO UNI RIGHT  Result Date: 01/01/2021 CLINICAL DATA:  First six-month follow-up for probably benign RIGHT breast calcifications. EXAM: DIGITAL DIAGNOSTIC UNILATERAL RIGHT MAMMOGRAM WITH TOMOSYNTHESIS AND CAD TECHNIQUE: Right digital diagnostic mammography and breast tomosynthesis was performed. The images were evaluated with computer-aided detection. COMPARISON:  Previous exam(s). ACR Breast Density Category b: There are scattered areas of fibroglandular density. FINDINGS: Multiple circumscribed oval masses are noted in the MEDIAL portion of the RIGHT breast. Masses vary from 1-4 millimeters in diameter and appear contiguous, spanning 8.9 x 3.6 x 5.1 centimeters. There are faint calcifications spanning 4.2 x 1.9 x 3.5 centimeters in this same, segmental distribution. IMPRESSION: Indeterminate calcifications and small circumscribed masses in a segmental distribution of the MEDIAL portion of the RIGHT breast, warranting tissue diagnosis. RECOMMENDATION: Recommend 2 site stereotactic biopsy of calcifications and masses in the MEDIAL portion of the RIGHT breast. Consider biopsy of calcifications in the more anterior aspect and biopsy of small masses in the more posterior aspect. I have discussed the findings and recommendations with the patient. If applicable, a reminder letter will be sent to  the patient regarding the next appointment. BI-RADS CATEGORY  4: Suspicious. Electronically Signed   By: Nolon Nations M.D.   On: 01/01/2021 10:24  MM CLIP PLACEMENT RIGHT  Result Date: 01/14/2021 CLINICAL DATA:  Evaluate post biopsy marker clip placement following stereotactic core needle biopsy of the anterior-posterior extents of medial right breast calcifications and associated small masses. EXAM:  3D DIAGNOSTIC RIGHT MAMMOGRAM POST STEREOTACTIC BIOPSY COMPARISON:  Previous exam(s). FINDINGS: 3D Mammographic images were obtained following stereotactic guided biopsy of the anterior-posterior extents of small masses and associated calcifications in the medial right breast. The X shaped biopsy clip lies along the anterior margin residual calcifications and the coil shaped biopsy clip lies along the posterior margin. IMPRESSION: Appropriate positioning of the X and coil shaped biopsy marking clip at the site of biopsy in the medial right breast. Final Assessment: Post Procedure Mammograms for Marker Placement Electronically Signed   By: Lajean Manes M.D.   On: 01/14/2021 11:33  MM RT BREAST BX W LOC DEV 1ST LESION IMAGE BX SPEC STEREO GUIDE  Addendum Date: 01/16/2021   ADDENDUM REPORT: 01/16/2021 12:41 ADDENDUM: Pathology revealed DUCTAL CARCINOMA IN SITU, INTERMEDIATE GRADE WITH CALCIFICATIONS of the Right breast, anterior extent, (x clip). This was found to be concordant by Dr. Lajean Manes. Pathology revealed DUCTAL CARCINOMA IN SITU, INTERMEDIATE GRADE WITH CALCIFICATIONS of the Right breast, posterior extent, (coil clip). This was found to be concordant by Dr. Lajean Manes. Pathology results were discussed with the patient, and her daughter Carson Myrtle, by telephone. The patient reported doing well after the biopsies with tenderness at the sites. Post biopsy instructions and care were reviewed and questions were answered. The patient was encouraged to call [facility]  for any additional concerns. My direct phone number was provided. The patient was referred to [facility] on January 23, 2021. Pathology results reported by Terie Purser, RN on 01/16/2021. Electronically Signed   By: Lajean Manes M.D.   On: 01/16/2021 12:41   Result Date: 01/16/2021 CLINICAL DATA:  Patient presents for stereotactic core needle biopsy the anterior-posterior extents of an area of small masses and associated calcifications in the medial right breast. EXAM: RIGHT BREAST STEREOTACTIC CORE NEEDLE BIOPSY: 2 BIOPSIES PERFORMED, THE ANTERIOR AND POSTERIOR EXTENTS OF THE STRING OF SMALL MASSES AND CALCIFICATIONS IN THE MEDIAL RIGHT BREAST. COMPARISON:  Previous exams. FINDINGS: The patient and I discussed the procedure of stereotactic-guided biopsy including benefits and alternatives. We discussed the high likelihood of a successful procedure. We discussed the risks of the procedure including infection, bleeding, tissue injury, clip migration, and inadequate sampling. Informed written consent was given. The usual time out protocol was performed immediately prior to the procedure. Lesion #1: Anterior extent, medial right breast masses and calcifications. Using sterile technique and 1% Lidocaine as local anesthetic, under stereotactic guidance, a 9 gauge vacuum assisted device was used to perform core needle biopsy of the small masses and calcifications in the anterior, medial right breast using a superior approach. Specimen radiograph was performed showing calcifications for which biopsy was performed. Specimens with calcifications are identified for pathology. Lesion quadrant: Upper inner quadrant At the conclusion of the procedure, an X shaped tissue marker clip was deployed into the biopsy cavity. Lesion #2: Posterior extent, medial right breast masses and calcifications. Using sterile technique and 1% Lidocaine as local  anesthetic, under stereotactic guidance, a 9 gauge vacuum assisted device was used to perform core needle biopsy of small masses and calcifications the posterior, medial right breast using a superior approach. Specimen radiograph was performed showing calcifications for which biopsy was performed. Specimens with calcifications are identified for pathology. Lesion quadrant: Upper inner quadrant At the conclusion of the procedure, a coil shaped tissue marker clip was deployed into the biopsy cavity. Follow-up 2-view mammogram was performed  and dictated separately. IMPRESSION: Stereotactic-guided biopsy of the anterior and posterior extent of and 8 cm span of small masses and 4 cm span of associated calcifications in the medial right breast. No apparent complications. Electronically Signed: By: Lajean Manes M.D. On: 01/14/2021 11:24  MM RT BREAST BX W LOC DEV EA AD LESION IMG BX SPEC STEREO GUIDE  Addendum Date: 01/16/2021   ADDENDUM REPORT: 01/16/2021 12:41 ADDENDUM: Pathology revealed DUCTAL CARCINOMA IN SITU, INTERMEDIATE GRADE WITH CALCIFICATIONS of the Right breast, anterior extent, (x clip). This was found to be concordant by Dr. Lajean Manes. Pathology revealed DUCTAL CARCINOMA IN SITU, INTERMEDIATE GRADE WITH CALCIFICATIONS of the Right breast, posterior extent, (coil clip). This was found to be concordant by Dr. Lajean Manes. Pathology results were discussed with the patient, and her daughter Carson Myrtle, by telephone. The patient reported doing well after the biopsies with tenderness at the sites. Post biopsy instructions and care were reviewed and questions were answered. The patient was encouraged to call [facility] for any additional concerns. My direct phone number was provided. The patient was referred to [facility] on January 23, 2021. Pathology results reported by Terie Purser, RN on  01/16/2021. Electronically Signed   By: Lajean Manes M.D.   On: 01/16/2021 12:41   Result Date: 01/16/2021 CLINICAL DATA:  Patient presents for stereotactic core needle biopsy the anterior-posterior extents of an area of small masses and associated calcifications in the medial right breast. EXAM: RIGHT BREAST STEREOTACTIC CORE NEEDLE BIOPSY: 2 BIOPSIES PERFORMED, THE ANTERIOR AND POSTERIOR EXTENTS OF THE STRING OF SMALL MASSES AND CALCIFICATIONS IN THE MEDIAL RIGHT BREAST. COMPARISON:  Previous exams. FINDINGS: The patient and I discussed the procedure of stereotactic-guided biopsy including benefits and alternatives. We discussed the high likelihood of a successful procedure. We discussed the risks of the procedure including infection, bleeding, tissue injury, clip migration, and inadequate sampling. Informed written consent was given. The usual time out protocol was performed immediately prior to the procedure. Lesion #1: Anterior extent, medial right breast masses and calcifications. Using sterile technique and 1% Lidocaine as local anesthetic, under stereotactic guidance, a 9 gauge vacuum assisted device was used to perform core needle biopsy of the small masses and calcifications in the anterior, medial right breast using a superior approach. Specimen radiograph was performed showing calcifications for which biopsy was performed. Specimens with calcifications are identified for pathology. Lesion quadrant: Upper inner quadrant At the conclusion of the procedure, an X shaped tissue marker clip was deployed into the biopsy cavity. Lesion #2: Posterior extent, medial right breast masses and calcifications. Using sterile technique and 1% Lidocaine as local anesthetic, under stereotactic guidance, a 9 gauge vacuum assisted device was used to perform core needle biopsy of small masses and calcifications the posterior, medial right breast using a superior approach. Specimen radiograph was performed showing  calcifications for which biopsy was performed. Specimens with calcifications are identified for pathology. Lesion quadrant: Upper inner quadrant At the conclusion of the procedure, a coil shaped tissue marker clip was deployed into the biopsy cavity. Follow-up 2-view mammogram was performed and dictated separately. IMPRESSION: Stereotactic-guided biopsy of the anterior and posterior extent of and 8 cm span of small masses and 4 cm span of associated calcifications in the medial right breast. No apparent complications. Electronically Signed: By: Lajean Manes M.D. On: 01/14/2021 11:24      IMPRESSION/PLAN: 1. Intermediate grade ER/PR positive DCIS  of the right breast. Dr. Lisbeth Renshaw discusses the pathology findings and reviews the nature of noninvasive breast disease. The consensus from the breast conference includes mastectomy due to the extent of her disease. Dr. Lisbeth Renshaw does not anticipate a role for radiation. We briefly discussed that radiation is most frequently given after breast conservation. The patient is going to meet with Dr. Donne Hazel and will likely pursue mastectomy. She may benefit from antiestrogen therapy as well. If she had breast conserving surgery we would be happy to meet back with her to review the delivery and logistics, but we briefly discussed risks, benefits, and side effects of radiotherapy if she had breast conserving surgery.     In a visit lasting 60 minutes, greater than 50% of the time was spent face to face reviewing her case, as well as in preparation of, discussing, and coordinating the patient's care.  The above documentation reflects my direct findings during this shared patient visit. Please see the separate note by Dr. Lisbeth Renshaw on this date for the remainder of the patient's plan of care.    Carola Rhine, [facility]    **Disclaimer: This note was dictated with voice recognition software. Similar sounding words can inadvertently be transcribed and this note may contain  transcription errors which may not have been corrected upon publication of note.**

## 2021-01-23 NOTE — Progress Notes (Signed)
REFERRING PROVIDER: Truitt Merle, MD [address],  [place] [address]  PRIMARY PROVIDER:  Lucianne Lei, MD  PRIMARY REASON FOR VISIT:  1. Ductal carcinoma in situ (DCIS) of right breast   2. Family history of breast cancer   3. Family history of ovarian cancer     HISTORY OF PRESENT ILLNESS:   Ms. Alen, a 80 y.o. female, was seen for a [place] cancer genetics consultation at the request of Dr. Burr Medico due to a personal and family history of cancer.  Ms. Cunning presents to clinic today to discuss the possibility of a hereditary predisposition to cancer, to discuss genetic testing, and to further clarify her future cancer risks, as well as potential cancer risks for family members.   In August 2022, at the age of 69, Ms. Kushner was diagnosed with ductal carcinoma in situ of the right breast. The preliminary treatment plan includes mastectomy.    CANCER HISTORY:  Oncology History  Ductal carcinoma in situ (DCIS) of right breast  01/01/2021 Mammogram   FINDINGS: Multiple circumscribed oval masses are noted in the MEDIAL portion of the RIGHT breast. Masses vary from 1-4 millimeters in diameter and appear contiguous, spanning 8.9 x 3.6 x 5.1 centimeters. There are faint calcifications spanning 4.2 x 1.9 x 3.5 centimeters in this same, segmental distribution.   01/14/2021 Cancer Staging   Staging form: Breast, AJCC 8th Edition - Clinical stage from 01/14/2021: Stage 0 (cTis (DCIS), cN0, cM0, G2, ER+, PR+, HER2: Not Assessed) - Signed by Truitt Merle, MD on 01/21/2021 Stage prefix: Initial diagnosis Histologic grading system: 3 grade system   01/14/2021 Pathology Results   Diagnosis 1. Breast, right, needle core biopsy, anterior extent - DUCTAL CARCINOMA IN SITU, INTERMEDIATE GRADE WITH CALCIFICATIONS. SEE NOTE 2. Breast, right, needle core biopsy, posterior extent - DUCTAL CARCINOMA IN SITU, INTERMEDIATE GRADE WITH CALCIFICATIONS. SEE NOTE Diagnosis Note 1. and 2. DCIS measures  0.6 cm in part 1 and 0.4 cm in part 2.  1. PROGNOSTIC INDICATORS Results: IMMUNOHISTOCHEMICAL AND MORPHOMETRIC ANALYSIS PERFORMED MANUALLY Estrogen Receptor: >95%, POSITIVE, STRONG STAINING INTENSITY Progesterone Receptor: >95%, POSITIVE, STRONG STAINING INTENSITY     01/17/2021 Initial Diagnosis   Ductal carcinoma in situ (DCIS) of right breast      RISK FACTORS:  Menarche was at age 39.  First live birth at age 89.  OCP use for approximately 0 years.  Ovaries intact: unilateral oophorectomy.  Hysterectomy: yes.  Menopausal status: postmenopausal.  HRT use: 0 years. Colonoscopy: yes;  most recent 2017 . Mammogram within the last year: yes.   Past Medical History:  Diagnosis Date   Arteriosclerotic cardiovascular disease (ASCVD) 2001   Bare-metal stent placed in the right coronary artery in 12/01; residual 50% lesion of the first diagonal and mid LAD   Breast cancer ([place])    Cerebrovascular disease    COPD (chronic obstructive pulmonary disease) (HCC)    Cyst, dermoid, arm, left 03/26/2018   Diabetes mellitus    excellent control with a low-dose of a single oral agent   Family history of breast cancer 01/23/2021   Family history of ovarian cancer 01/23/2021   GERD (gastroesophageal reflux disease)    with ulcers   History of right coronary artery stent placement 2000   Hyperlipidemia    Hypertension    Thalassemia minor    Tobacco abuse, in remission    35 pack years; Quit in 1980    Past Surgical History:  Procedure Laterality Date   BIOPSY  02/28/2020   Procedure: BIOPSY;  Surgeon: Eloise Harman, DO;  [facility];  Service: Endoscopy;;   COLONOSCOPY  2006   Dr. Collene Mares: normal   COLONOSCOPY  2000   Dr. Everlean Cherry: normal    COLONOSCOPY N/A 03/03/2016   Dr. Oneida Alar: diverticulosis, non-bleeding hemorrhoids, redundant left colon.   DILATION AND CURETTAGE OF UTERUS     ESOPHAGOGASTRODUODENOSCOPY  2000   Dr. Everlean Cherry: gastritis    ESOPHAGOGASTRODUODENOSCOPY   2006   Dr. Collene Mares: small hiatal hernia, gastritis, negative H.pylori    ESOPHAGOGASTRODUODENOSCOPY N/A 03/03/2016   Procedure: ESOPHAGOGASTRODUODENOSCOPY (EGD);  Surgeon: Danie Binder, MD;  [facility];  Service: Endoscopy;  Laterality: N/A;   ESOPHAGOGASTRODUODENOSCOPY N/A 06/03/2016   Dr. Oneida Alar: nonaggressive gastritis due to aspirin use. Previous ulcers had healed.   ESOPHAGOGASTRODUODENOSCOPY (EGD) WITH PROPOFOL N/A 02/28/2020   focal inflammation in the gastric antrum consistent with gastritis on biopsies. No H.pylori.    EXCISION OF KELOID Left 03/26/2018   Procedure: EXCISION OF LEFT ARM MASS;  Surgeon: Fanny Skates, MD;  Location: [facility];  Service: General;  Laterality: Left;   GIVENS CAPSULE STUDY N/A 10/10/2020   Procedure: GIVENS CAPSULE STUDY;  Surgeon: Eloise Harman, DO;  [facility];  Service: Endoscopy;  Laterality: N/A;  7:30am   INGUINAL HERNIA REPAIR  1970s   Right   VAGINAL HYSTERECTOMY  1980   Unilateral oophorectomy    Social History   Socioeconomic History   Marital status: Divorced    Spouse name: Not on file   Number of children: 3   Years of education: Not on file   Highest education level: Not on file  Occupational History   Occupation: Retired    Comment: [employer] work  Tobacco Use   Smoking status: Former    Packs/day: 1.00    Years: 18.00    Pack years: 18.00    Types: Cigarettes    Start date: 06/02/1965    Quit date: 06/09/1980    Years since quitting: 40.6   Smokeless tobacco: Never  Vaping Use   Vaping Use: Never used  Substance and Sexual Activity   Alcohol use: No    Alcohol/week: 0.0 standard drinks   Drug use: No   Sexual activity: Never  Other Topics Concern   Not on file  Social History Narrative   Lives with daughter    Social Determinants of Health   Financial Resource Strain: Not on file  Food Insecurity: No Food Insecurity   Worried About Charity fundraiser in the Last Year:  Never true   [place] in the Last Year: Never true  Transportation Needs: No Transportation Needs   Lack of Transportation (Medical): No   Lack of Transportation (Non-Medical): No  Physical Activity: Not on file  Stress: Not on file  Social Connections: Not on file     FAMILY HISTORY:  We obtained a detailed, 4-generation family history.  Significant diagnoses are listed below: Family History  Problem Relation Age of Onset   Lung cancer Mother        dx 60s   Ovarian cancer Mother        dx before 64   Diabetes Half-Sister    Breast cancer Half-Sister 49   Multiple myeloma Nephew 14       war-related exposures    Ms. Metzinger is unaware of previous family history of genetic testing for hereditary cancer risks. There is no reported Ashkenazi  Jewish ancestry. There is no known consanguinity.  GENETIC COUNSELING ASSESSMENT: Ms. Blumenstock is a 80 y.o. female with a personal and family history of cancer which is somewhat suggestive of a hereditary cancer syndrome and predisposition to cancer given the presence of related cancers in the family. We, therefore, discussed and recommended the following at today's visit.   DISCUSSION: We discussed that 5 - 10% of cancer is hereditary, with most cases of hereditary breast cancer associated with mutations in BRCA1/2.  There are other genes that can be associated with hereditary breast or ovarian cancer syndromes.  Type of cancer risk of level of risk are gene-specific.  We discussed that testing is beneficial for several reasons including knowing how to follow individuals after completing their treatment, identifying whether potential treatment options would be beneficial, and understanding if other family members could be at risk for cancer and allowing them to undergo genetic testing.   We reviewed the characteristics, features and inheritance patterns of hereditary cancer syndromes. We also discussed genetic testing, including the appropriate  family members to test, the process of testing, insurance coverage and turn-around-time for results. We discussed the implications of a negative, positive, carrier and/or variant of uncertain significant result. We recommended Ms. Beecher pursue genetic testing for a panel that includes genes associated with breast and ovarian cancer.   The CustomNext-Cancer+RNAinsight panel offered by [facility] includes sequencing and rearrangement analysis for the following 47 genes:  APC, ATM, AXIN2, BARD1, BMPR1A, BRCA1, BRCA2, BRIP1, CDH1, CDK4, CDKN2A, CHEK2, DICER1, EPCAM, GREM1, HOXB13, MEN1, MLH1, MSH2, MSH3, MSH6, MUTYH, NBN, NF1, NF2, NTHL1, PALB2, PMS2, POLD1, POLE, PTEN, RAD51C, RAD51D, RECQL, RET, SDHA, SDHAF2, SDHB, SDHC, SDHD, SMAD4, SMARCA4, STK11, TP53, TSC1, TSC2, and VHL.  RNA data is routinely analyzed for use in variant interpretation for all genes.  Based on Ms. Brizzi's personal and family history of cancer, she meets medical criteria for genetic testing. Despite that she meets criteria, she may still have an out of pocket cost. We discussed that if her out of pocket cost for testing is over $100, the laboratory should contact her to discuss self-pay options and patient pay assistance programs.    PLAN: After considering the risks, benefits, and limitations, Ms. Aziz provided informed consent to pursue genetic testing and the blood sample was sent to [facility] for analysis of the CustomNext-Cancer +RNAinsight Panel. Results should be available within approximately 3 weeks' time, at which point they will be disclosed by telephone to Ms. Krogh, as will any additional recommendations warranted by these results. Ms. Helinski will receive a summary of her genetic counseling visit and a copy of her results once available. This information will also be available in Epic.   Lastly, we encouraged Ms. Tadros to remain in contact with cancer genetics annually so that we can continuously update the  family history and inform her of any changes in cancer genetics and testing that may be of benefit for this family.   Ms. Stanczak questions were answered to her satisfaction today. Our contact information was provided should additional questions or concerns arise. Thank you for the referral and allowing [place] to share in the care of your patient.   Gresham Caetano M. Joette Catching, [place], [facility] Genetic Counselor [email or web address] (P) [phone]  The patient was seen for a total of 20 minutes in face-to-face genetic counseling.  The patient was accompanied by her daughter, Rosyln.  She also had her daughter, [name], and niece, Venus, present by phone.  Drs. Magrinat, Lindi Adie and/or [employer]  were available to discuss this case as needed.    _______________________________________________________________________ For Office Staff:  Number of people involved in session: 4 Was an Intern/ student involved with case: no

## 2021-01-23 NOTE — Progress Notes (Signed)
[place] Work  Initial Assessment   Nancy Liu is a 80 y.o. year old female accompanied by daughter Nancy Liu) with niece present via phone. Clinical Social Work was referred by [facility] for assessment of psychosocial needs.   SDOH (Social Determinants of Health) assessments performed: Yes SDOH Interventions    Flowsheet Row Most Recent Value  SDOH Interventions   Food Insecurity Interventions Intervention Not Indicated  Housing Interventions Intervention Not Indicated  Transportation Interventions Intervention Not Indicated       Distress Screen completed: Yes ONCBCN DISTRESS SCREENING 01/23/2021  Screening Type Initial Screening  Distress experienced in past week (1-10) 6      Family/Social Information:  Housing Arrangement: patient lives with daughter, Nancy Liu. Adult son Nancy Liu) and daughter Nancy Liu) live nearby as does other extended family Family members/support persons in your life? Family and [employer] concerns: no  Employment: Retired. Income source: Paediatric nurse concerns: No Type of concern: None Food access concerns: no Religious or spiritual practice: yes, very involved in church community Medication Concerns: no  Services Currently in place:  n/a  Coping/ Adjustment to diagnosis: Patient understands treatment plan and what happens next? yes, feeling a little overwhelmed, but understands the general plan Concerns about diagnosis and/or treatment: Overwhelmed by information Patient reported stressors: Adjusting to my illness Hopes and priorities: hopes to continue to be active in her community Patient enjoys  being involved in church and community Current coping skills/ strengths: Motivation for treatment/growth, Heritage manager, and Supportive family/friends    SUMMARY: Current SDOH Barriers:  No significant SDOH barriers noted at this time  Clinical Social Work Clinical Goal(s):  No clinical SW goals  at this time  Interventions: Discussed common feeling and emotions when being diagnosed with cancer, and the importance of support during treatment Informed patient of the support team roles and support services at [facility] Provided CSW contact information and encouraged patient to call with any questions or concerns   Follow Up Plan: Patient will contact CSW with any support or resource needs Patient verbalizes understanding of plan: Yes    Christeen Douglas LCSW

## 2021-01-23 NOTE — Progress Notes (Signed)
[place]   [phone] Fax:([phone]) [place] Note   Patient Care Team: Lucianne Lei, MD as PCP - General (Family Medicine) Harl Bowie, Alphonse Guild, MD as PCP - Cardiology (Cardiology) Lucianne Lei, MD (Family Medicine) Rockwell Germany, RN as Oncology Nurse Navigator Mauro Kaufmann, RN as Oncology Nurse Navigator Truitt Merle, MD as Consulting Physician (Hematology) Rolm Bookbinder, MD as Consulting Physician (General Surgery) Kyung Rudd, MD as Consulting Physician (Radiation Oncology) Kyung Rudd, MD as Consulting Physician (Radiation Oncology)  Date of Service:  01/23/2021   CHIEF COMPLAINTS/PURPOSE OF CONSULTATION:  Right Breast DCIS, ER+  REFERRING PHYSICIAN:  Breast center   Oncology History  Ductal carcinoma in situ (DCIS) of right breast  01/01/2021 Mammogram   FINDINGS: Multiple circumscribed oval masses are noted in the MEDIAL portion of the RIGHT breast. Masses vary from 1-4 millimeters in diameter and appear contiguous, spanning 8.9 x 3.6 x 5.1 centimeters. There are faint calcifications spanning 4.2 x 1.9 x 3.5 centimeters in this same, segmental distribution.   01/14/2021 Cancer Staging   Staging form: Breast, AJCC 8th Edition - Clinical stage from 01/14/2021: Stage 0 (cTis (DCIS), cN0, cM0, G2, ER+, PR+, HER2: Not Assessed) - Signed by Truitt Merle, MD on 01/21/2021 Stage prefix: Initial diagnosis Histologic grading system: 3 grade system   01/14/2021 Pathology Results   Diagnosis 1. Breast, right, needle core biopsy, anterior extent - DUCTAL CARCINOMA IN SITU, INTERMEDIATE GRADE WITH CALCIFICATIONS. SEE NOTE 2. Breast, right, needle core biopsy, posterior extent - DUCTAL CARCINOMA IN SITU, INTERMEDIATE GRADE WITH CALCIFICATIONS. SEE NOTE Diagnosis Note 1. and 2. DCIS measures 0.6 cm in part 1 and 0.4 cm in part 2.  1. PROGNOSTIC INDICATORS Results: IMMUNOHISTOCHEMICAL AND MORPHOMETRIC ANALYSIS PERFORMED  MANUALLY Estrogen Receptor: >95%, POSITIVE, STRONG STAINING INTENSITY Progesterone Receptor: >95%, POSITIVE, STRONG STAINING INTENSITY     01/17/2021 Initial Diagnosis   Ductal carcinoma in situ (DCIS) of right breast      HISTORY OF PRESENTING ILLNESS:  Nancy Liu 80 y.o. female is a here because of breast cancer. The patient was referred by Breast Center. The patient presents to the clinic today accompanied by her daughter, as well as another daughter and a cousin on speaker phone.   She was under short-term follow up of right breast calcifications. She underwent right diagnostic mammography on 01/01/21 showing: indeterminate calcifications and small circumscribed masses in a segmental distribution of medial right breast.  Biopsy on 01/14/21 showed: DCIS, intermediate grade. Prognostic indicators significant for: estrogen receptor, >95% strongly positive and progesterone receptor, >95% strongly positive.     Today the patient notes they felt/feeling prior/after... -She denies any breast pain -She notes she continues to do her daily activities, although she stopped exercising with the pandemic.   She has a PMHx of.... -arthritis and sciatic nerve pain, uses tylenol very infrequently -h/o depression related to start of pandemic, improving -HTN, DM -s/p partial hysterectomy   Socially... -She lives at home with her daughter -she is a retired Web designer -She reports lung cancer in her mother and breast cancer in her sister. -former smoker, quit in 1982 -does not drink alcohol   GYN HISTORY  Menarchal: 80 years old LMP: with hysterectomy Contraceptive: HRT: never used GP: 3   REVIEW OF SYSTEMS:    Constitutional: Denies fevers, chills or abnormal night sweats, (+) fatigue Eyes: Denies blurriness of vision, double vision or watery eyes Ears, nose, mouth, throat, and face: Denies mucositis or sore throat, (+)  hearing loss, (+) ear infections, (+) difficulty  swallowing Respiratory: Denies dyspnea or wheezes, (+) shortness of breath, (+) cough Cardiovascular: Denies palpitation, chest discomfort or lower extremity swelling Gastrointestinal:  Denies nausea, or change in bowel habits, (+) heartburn, (+) ulcer Skin: (+) rashes Lymphatics: Denies new lymphadenopathy or easy bruising Neurological:Denies numbness, tingling or new weaknesses Behavioral/Psych: (+) anxiety, (+) depression, (+) phobias All other systems were reviewed with the patient and are negative.   MEDICAL HISTORY:  Past Medical History:  Diagnosis Date   Arteriosclerotic cardiovascular disease (ASCVD) 2001   Bare-metal stent placed in the right coronary artery in 12/01; residual 50% lesion of the first diagonal and mid LAD   Breast cancer (HCC)    Cerebrovascular disease    COPD (chronic obstructive pulmonary disease) (HCC)    Cyst, dermoid, arm, left 03/26/2018   Diabetes mellitus    excellent control with a low-dose of a single oral agent   GERD (gastroesophageal reflux disease)    with ulcers   History of right coronary artery stent placement 2000   Hyperlipidemia    Hypertension    Thalassemia minor    Tobacco abuse, in remission    35 pack years; Quit in 1980    SURGICAL HISTORY: Past Surgical History:  Procedure Laterality Date   BIOPSY  02/28/2020   Procedure: BIOPSY;  Surgeon: Eloise Harman, DO;  [facility];  Service: Endoscopy;;   COLONOSCOPY  2006   Dr. Collene Mares: normal   COLONOSCOPY  2000   Dr. Everlean Cherry: normal    COLONOSCOPY N/A 03/03/2016   Dr. Oneida Alar: diverticulosis, non-bleeding hemorrhoids, redundant left colon.   DILATION AND CURETTAGE OF UTERUS     ESOPHAGOGASTRODUODENOSCOPY  2000   Dr. Everlean Cherry: gastritis    ESOPHAGOGASTRODUODENOSCOPY  2006   Dr. Collene Mares: small hiatal hernia, gastritis, negative H.pylori    ESOPHAGOGASTRODUODENOSCOPY N/A 03/03/2016   Procedure: ESOPHAGOGASTRODUODENOSCOPY (EGD);  Surgeon: Danie Binder, MD;  Location: [facility];  Service: Endoscopy;  Laterality: N/A;   ESOPHAGOGASTRODUODENOSCOPY N/A 06/03/2016   Dr. Oneida Alar: nonaggressive gastritis due to aspirin use. Previous ulcers had healed.   ESOPHAGOGASTRODUODENOSCOPY (EGD) WITH PROPOFOL N/A 02/28/2020   focal inflammation in the gastric antrum consistent with gastritis on biopsies. No H.pylori.    EXCISION OF KELOID Left 03/26/2018   Procedure: EXCISION OF LEFT ARM MASS;  Surgeon: Fanny Skates, MD;  Location: [facility];  Service: General;  Laterality: Left;   GIVENS CAPSULE STUDY N/A 10/10/2020   Procedure: GIVENS CAPSULE STUDY;  Surgeon: Eloise Harman, DO;  Location[facility];  Service: Endoscopy;  Laterality: N/A;  7:30am   INGUINAL HERNIA REPAIR  1970s   Right   VAGINAL HYSTERECTOMY  1980   Unilateral oophorectomy    SOCIAL HISTORY: Social History   Socioeconomic History   Marital status: Divorced    Spouse name: Not on file   Number of children: 3   Years of education: Not on file   Highest education level: Not on file  Occupational History   Occupation: Retired    Comment: [place] work  Tobacco Use   Smoking status: Former    Packs/day: 1.00    Years: 18.00    Pack years: 18.00    Types: Cigarettes    Start date: 06/02/1965    Quit date: 06/09/1980    Years since quitting: 40.6   Smokeless tobacco: Never  Vaping Use   Vaping Use: Never used  Substance and Sexual Activity   Alcohol  use: No    Alcohol/week: 0.0 standard drinks   Drug use: No   Sexual activity: Never  Other Topics Concern   Not on file  Social History Narrative   Lives with daughter    Social Determinants of Health   Financial Resource Strain: Not on file  Food Insecurity: No Food Insecurity   Worried About Charity fundraiser in the Last Year: Never true   [place] in the Last Year: Never true  Transportation Needs: No Transportation Needs   Lack of Transportation (Medical): No   Lack of Transportation (Non-Medical): No   Physical Activity: Not on file  Stress: Not on file  Social Connections: Not on file  Intimate Partner Violence: Not on file    FAMILY HISTORY: Family History  Problem Relation Age of Onset   Heart disease Mother        also hypertension and asthma   Cancer Mother        lung cancer   Diabetes Sister    Breast cancer Sister    Colon cancer Neg Hx    Colon polyps Neg Hx     ALLERGIES:  is allergic to protonix [pantoprazole sodium] and shellfish allergy.  MEDICATIONS:  Current Outpatient Medications  Medication Sig Dispense Refill   amLODipine (NORVASC) 10 MG tablet Take 1 tablet (10 mg total) by mouth daily. 90 tablet 3   aspirin EC 81 MG tablet Take 81 mg by mouth daily. Swallow whole.     augmented betamethasone dipropionate (DIPROLENE-AF) 0.05 % ointment Apply 1 application topically 2 (two) times daily as needed (skin itching/dryness).     benzonatate (TESSALON) 100 MG capsule Take 1-2 capsules (100-200 mg total) by mouth 3 (three) times daily as needed. 60 capsule 0   carvedilol (COREG) 6.25 MG tablet TAKE ONE TABLET BY MOUTH BEFORE BREAKFAST and TAKE ONE TABLET BY MOUTH EVERYDAY AT BEDTIME 180 tablet 3   cetirizine (ZYRTEC) 5 MG tablet Take 1 tablet (5 mg total) by mouth daily. 30 tablet 0   COQ10 MAXIMUM STRENGTH 400 MG CAPS Take 400 mg by mouth every morning.     doxepin (SINEQUAN) 10 MG capsule Take 10 mg by mouth at bedtime.     ergocalciferol (VITAMIN D2) 1.25 MG (50000 UT) capsule Take 1 capsule (50,000 Units total) by mouth once a week. 16 capsule 3   ferrous sulfate 325 (65 FE) MG tablet Take 325 mg by mouth daily.      irbesartan-hydrochlorothiazide (AVALIDE) 300-12.5 MG tablet Take 1 tablet by mouth daily.      lansoprazole (PREVACID) 30 MG capsule Take 1 capsule (30 mg total) by mouth 2 (two) times daily before a meal. 30 capsule 5   Latanoprostene Bunod 0.024 % SOLN Place 1 drop into both eyes See admin instructions. Instill 1 drop into both eyes 3 times a week  at bedtime.     metFORMIN (GLUCOPHAGE) 1000 MG tablet Take 500 mg by mouth daily.      nitroGLYCERIN (NITROSTAT) 0.4 MG SL tablet Place 1 tablet (0.4 mg total) under the tongue every 5 (five) minutes as needed for chest pain. 25 tablet 3   rosuvastatin (CRESTOR) 20 MG tablet TAKE ONE TABLET BY MOUTH EVERYDAY AT BEDTIME 90 tablet 3   Sennosides-Docusate Sodium (SENNA S PO) Take 2 tablets by mouth as needed.     No current facility-administered medications for this visit.    PHYSICAL EXAMINATION: ECOG PERFORMANCE STATUS: 0 - Asymptomatic  Vitals:   01/23/21  0912  BP: (!) 142/80  Pulse: 79  Resp: 18  Temp: 97.9 F (36.6 C)  SpO2: 100%   Filed Weights   01/23/21 0912  Weight: 150 lb 3.2 oz (68.1 kg)    GENERAL:alert, no distress and comfortable SKIN: skin color, texture, turgor are normal, no rashes or significant lesions EYES: normal, Conjunctiva are pink and non-injected, sclera clear  NECK: supple, thyroid normal size, non-tender, without nodularity LYMPH:  no palpable lymphadenopathy in the cervical, axillary  LUNGS: clear to auscultation and percussion with normal breathing effort HEART: regular rate & rhythm and no murmurs and no lower extremity edema ABDOMEN:abdomen soft, non-tender and normal bowel sounds Musculoskeletal:no cyanosis of digits and no clubbing  NEURO: alert & oriented x 3 with fluent speech, no focal motor/sensory deficits BREAST: 2 cm palpable lump at 2 o'clock in right breast, associated bruising at biopsy site; No palpable mass, nodules on left or adenopathy bilaterally.   LABORATORY DATA:  I have reviewed the data as listed CBC Latest Ref Rng & Units 01/23/2021 12/26/2020 10/03/2020  WBC 4.0 - 10.5 K/uL 10.9(H) 8.4 10.2  Hemoglobin 12.0 - 15.0 g/dL 12.9 12.5 12.4  Hematocrit 36.0 - 46.0 % 41.1 40.5 39.6  Platelets 150 - 400 K/uL 264 280 287    CMP Latest Ref Rng & Units 01/23/2021 12/26/2020 10/03/2020  Glucose 70 - 99 mg/dL 112(H) 109(H) 94  BUN 8 - 23  mg/dL 21 23 23   Creatinine 0.44 - 1.00 mg/dL 1.22(H) 1.16(H) 1.07(H)  Sodium 135 - 145 mmol/L 142 138 136  Potassium 3.5 - 5.1 mmol/L 4.2 4.2 3.5  Chloride 98 - 111 mmol/L 101 101 100  CO2 22 - 32 mmol/L 29 29 25   Calcium 8.9 - 10.3 mg/dL 10.0 9.2 9.2  Total Protein 6.5 - 8.1 g/dL 7.4 7.0 -  Total Bilirubin 0.3 - 1.2 mg/dL 0.6 0.7 -  Alkaline Phos 38 - 126 U/L 65 58 -  AST 15 - 41 U/L 24 21 -  ALT 0 - 44 U/L 17 15 -     RADIOGRAPHIC STUDIES: I have personally reviewed the radiological images as listed and agreed with the findings in the report. MM DIAG BREAST TOMO UNI RIGHT  Result Date: 01/01/2021 CLINICAL DATA:  First six-month follow-up for probably benign RIGHT breast calcifications. EXAM: DIGITAL DIAGNOSTIC UNILATERAL RIGHT MAMMOGRAM WITH TOMOSYNTHESIS AND CAD TECHNIQUE: Right digital diagnostic mammography and breast tomosynthesis was performed. The images were evaluated with computer-aided detection. COMPARISON:  Previous exam(s). ACR Breast Density Category b: There are scattered areas of fibroglandular density. FINDINGS: Multiple circumscribed oval masses are noted in the MEDIAL portion of the RIGHT breast. Masses vary from 1-4 millimeters in diameter and appear contiguous, spanning 8.9 x 3.6 x 5.1 centimeters. There are faint calcifications spanning 4.2 x 1.9 x 3.5 centimeters in this same, segmental distribution. IMPRESSION: Indeterminate calcifications and small circumscribed masses in a segmental distribution of the MEDIAL portion of the RIGHT breast, warranting tissue diagnosis. RECOMMENDATION: Recommend 2 site stereotactic biopsy of calcifications and masses in the MEDIAL portion of the RIGHT breast. Consider biopsy of calcifications in the more anterior aspect and biopsy of small masses in the more posterior aspect. I have discussed the findings and recommendations with the patient. If applicable, a reminder letter will be sent to the patient regarding the next appointment. BI-RADS  CATEGORY  4: Suspicious. Electronically Signed   By: Nolon Nations M.D.   On: 01/01/2021 10:24  MM CLIP PLACEMENT RIGHT  Result Date: 01/14/2021 CLINICAL  DATA:  Evaluate post biopsy marker clip placement following stereotactic core needle biopsy of the anterior-posterior extents of medial right breast calcifications and associated small masses. EXAM: 3D DIAGNOSTIC RIGHT MAMMOGRAM POST STEREOTACTIC BIOPSY COMPARISON:  Previous exam(s). FINDINGS: 3D Mammographic images were obtained following stereotactic guided biopsy of the anterior-posterior extents of small masses and associated calcifications in the medial right breast. The X shaped biopsy clip lies along the anterior margin residual calcifications and the coil shaped biopsy clip lies along the posterior margin. IMPRESSION: Appropriate positioning of the X and coil shaped biopsy marking clip at the site of biopsy in the medial right breast. Final Assessment: Post Procedure Mammograms for Marker Placement Electronically Signed   By: Lajean Manes M.D.   On: 01/14/2021 11:33  MM RT BREAST BX W LOC DEV 1ST LESION IMAGE BX SPEC STEREO GUIDE  Addendum Date: 01/16/2021   ADDENDUM REPORT: 01/16/2021 12:41 ADDENDUM: Pathology revealed DUCTAL CARCINOMA IN SITU, INTERMEDIATE GRADE WITH CALCIFICATIONS of the Right breast, anterior extent, (x clip). This was found to be concordant by Dr. Lajean Manes. Pathology revealed DUCTAL CARCINOMA IN SITU, INTERMEDIATE GRADE WITH CALCIFICATIONS of the Right breast, posterior extent, (coil clip). This was found to be concordant by Dr. Lajean Manes. Pathology results were discussed with the patient, and her daughter Carson Myrtle, by telephone. The patient reported doing well after the biopsies with tenderness at the sites. Post biopsy instructions and care were reviewed and questions were answered. The patient was encouraged to call [facility] for any additional concerns. My direct phone number  was provided. The patient was referred to [facility] on January 23, 2021. Pathology results reported by Terie Purser, RN on 01/16/2021. Electronically Signed   By: Lajean Manes M.D.   On: 01/16/2021 12:41   Result Date: 01/16/2021 CLINICAL DATA:  Patient presents for stereotactic core needle biopsy the anterior-posterior extents of an area of small masses and associated calcifications in the medial right breast. EXAM: RIGHT BREAST STEREOTACTIC CORE NEEDLE BIOPSY: 2 BIOPSIES PERFORMED, THE ANTERIOR AND POSTERIOR EXTENTS OF THE STRING OF SMALL MASSES AND CALCIFICATIONS IN THE MEDIAL RIGHT BREAST. COMPARISON:  Previous exams. FINDINGS: The patient and I discussed the procedure of stereotactic-guided biopsy including benefits and alternatives. We discussed the high likelihood of a successful procedure. We discussed the risks of the procedure including infection, bleeding, tissue injury, clip migration, and inadequate sampling. Informed written consent was given. The usual time out protocol was performed immediately prior to the procedure. Lesion #1: Anterior extent, medial right breast masses and calcifications. Using sterile technique and 1% Lidocaine as local anesthetic, under stereotactic guidance, a 9 gauge vacuum assisted device was used to perform core needle biopsy of the small masses and calcifications in the anterior, medial right breast using a superior approach. Specimen radiograph was performed showing calcifications for which biopsy was performed. Specimens with calcifications are identified for pathology. Lesion quadrant: Upper inner quadrant At the conclusion of the procedure, an X shaped tissue marker clip was deployed into the biopsy cavity. Lesion #2: Posterior extent, medial right breast masses and calcifications. Using sterile technique and 1% Lidocaine as local anesthetic, under stereotactic guidance, a 9 gauge vacuum  assisted device was used to perform core needle biopsy of small masses and calcifications the posterior, medial right breast using a superior approach. Specimen radiograph was performed showing calcifications for which biopsy was performed. Specimens with calcifications are identified for pathology. Lesion  quadrant: Upper inner quadrant At the conclusion of the procedure, a coil shaped tissue marker clip was deployed into the biopsy cavity. Follow-up 2-view mammogram was performed and dictated separately. IMPRESSION: Stereotactic-guided biopsy of the anterior and posterior extent of and 8 cm span of small masses and 4 cm span of associated calcifications in the medial right breast. No apparent complications. Electronically Signed: By: Lajean Manes M.D. On: 01/14/2021 11:24  MM RT BREAST BX W LOC DEV EA AD LESION IMG BX SPEC STEREO GUIDE  Addendum Date: 01/16/2021   ADDENDUM REPORT: 01/16/2021 12:41 ADDENDUM: Pathology revealed DUCTAL CARCINOMA IN SITU, INTERMEDIATE GRADE WITH CALCIFICATIONS of the Right breast, anterior extent, (x clip). This was found to be concordant by Dr. Lajean Manes. Pathology revealed DUCTAL CARCINOMA IN SITU, INTERMEDIATE GRADE WITH CALCIFICATIONS of the Right breast, posterior extent, (coil clip). This was found to be concordant by Dr. Lajean Manes. Pathology results were discussed with the patient, and her daughter Carson Myrtle, by telephone. The patient reported doing well after the biopsies with tenderness at the sites. Post biopsy instructions and care were reviewed and questions were answered. The patient was encouraged to call [facility] for any additional concerns. My direct phone number was provided. The patient was referred to [facility] on January 23, 2021. Pathology results reported by Terie Purser, RN on 01/16/2021. Electronically Signed   By: Lajean Manes M.D.   On:  01/16/2021 12:41   Result Date: 01/16/2021 CLINICAL DATA:  Patient presents for stereotactic core needle biopsy the anterior-posterior extents of an area of small masses and associated calcifications in the medial right breast. EXAM: RIGHT BREAST STEREOTACTIC CORE NEEDLE BIOPSY: 2 BIOPSIES PERFORMED, THE ANTERIOR AND POSTERIOR EXTENTS OF THE STRING OF SMALL MASSES AND CALCIFICATIONS IN THE MEDIAL RIGHT BREAST. COMPARISON:  Previous exams. FINDINGS: The patient and I discussed the procedure of stereotactic-guided biopsy including benefits and alternatives. We discussed the high likelihood of a successful procedure. We discussed the risks of the procedure including infection, bleeding, tissue injury, clip migration, and inadequate sampling. Informed written consent was given. The usual time out protocol was performed immediately prior to the procedure. Lesion #1: Anterior extent, medial right breast masses and calcifications. Using sterile technique and 1% Lidocaine as local anesthetic, under stereotactic guidance, a 9 gauge vacuum assisted device was used to perform core needle biopsy of the small masses and calcifications in the anterior, medial right breast using a superior approach. Specimen radiograph was performed showing calcifications for which biopsy was performed. Specimens with calcifications are identified for pathology. Lesion quadrant: Upper inner quadrant At the conclusion of the procedure, an X shaped tissue marker clip was deployed into the biopsy cavity. Lesion #2: Posterior extent, medial right breast masses and calcifications. Using sterile technique and 1% Lidocaine as local anesthetic, under stereotactic guidance, a 9 gauge vacuum assisted device was used to perform core needle biopsy of small masses and calcifications the posterior, medial right breast using a superior approach. Specimen radiograph was performed showing calcifications for which biopsy was performed. Specimens with  calcifications are identified for pathology. Lesion quadrant: Upper inner quadrant At the conclusion of the procedure, a coil shaped tissue marker clip was deployed into the biopsy cavity. Follow-up 2-view mammogram was performed and dictated separately. IMPRESSION: Stereotactic-guided biopsy of the anterior and posterior extent of and 8 cm span of small masses and 4 cm span of associated calcifications in the medial  right breast. No apparent complications. Electronically Signed: By: Lajean Manes M.D. On: 01/14/2021 11:24   ASSESSMENT & PLAN:  Nancy BRERETON is a 80 y.o.  female with a history of   1. Right breast DCIS, grade 2, ER+/PR+ -This was discovered by screening mammogram on 01/01/21 which showed a spread of calcifications and small masses in medial right breast. Biopsies on 01/14/21 confirmed DCIS. -I discussed her breast imaging and needle biopsy results with patient and her family members in great detail. -She has been seen by breast surgeon Dr. Donne Hazel, who recommends mastectomy given the large area of disease. -Given her family history of breast cancer, she is interested in genetic testing to ruled out inheritable breast cancer. We discussed that it may not be covered by insurance -Her DCIS will be cured by complete surgical resection. Any form of adjuvant therapy is preventive. -Given her strongly positive ER and PR, we discussed antiestrogen therapy with tamoxifen, which decrease her risk of future breast cancer by ~40%. Given her old age, and mastectomy, I do not feel strongly that she will need this but reasonable to consider if she wants to try and avoid any more breast cancer ---The potential side effects from tamoxifen, which includes but not limited to, hot flash, skin and vaginal dryness, slightly increased risk of cardiovascular disease and cataract, small risk of thrombosis and endometrial cancer, were discussed with her in great details. Preventive strategies for thrombosis,  such as being physically active, using compression stocks, avoid cigarette smoking, etc., were reviewed with her. I also recommend her to follow-up with her gynecologist once a year, and watch for vaginal spotting or bleeding, as a clinically sign of endometrial cancer, etc.  --The potential benefit and side effects from aromatase inhibitor, which includes but not limited to, hot flash, skin and vaginal dryness, metabolic changes ( increased blood glucose, cholesterol, weight, etc.), slightly in increased risk of cardiovascular disease, cataracts, muscular and joint discomfort, osteopenia and osteoporosis, etc, were discussed with her in great details.  -She is open to adjuvant antiestrogen therapy, but is also concerned about potential side effect. -She will not need radiation if she has mastectomy. -We discussed breast cancer surveillance after she completes treatment, Including annual mammogram, breast exam every 6-12 months.  2. Bone Health  -Her most recent DEXA in 2008. We will obtain a new one for baseline.   PLAN:  -She will have mastectomy soon -Genetic refer per her request  -DEXA in a month -I'll see her a month after her surgery to review her pathology result and finalize her antiestrogen therapy.     Orders Placed This Encounter  Procedures   DG Bone Density    Standing Status:   Future    Standing Expiration Date:   01/23/2022    Order Specific Question:   Reason for Exam (SYMPTOM  OR DIAGNOSIS REQUIRED)    Answer:   screening    Order Specific Question:   Preferred imaging location?    Answer:   [facility]     All questions were answered. The patient knows to call the clinic with any problems, questions or concerns. The total time spent in the appointment was 50 minutes.     Truitt Merle, MD 01/23/2021 1:05 PM  I, Wilburn Mylar, am acting as scribe for Truitt Merle, MD.   I have reviewed the above documentation for accuracy and completeness, and I agree with  the above.

## 2021-01-24 ENCOUNTER — Other Ambulatory Visit: Payer: Self-pay | Admitting: General Surgery

## 2021-01-24 ENCOUNTER — Telehealth: Payer: Self-pay | Admitting: Cardiology

## 2021-01-24 ENCOUNTER — Encounter: Payer: Self-pay | Admitting: *Deleted

## 2021-01-24 NOTE — Telephone Encounter (Signed)
    Patient Name: Nancy Liu  DOB: Sep 30, 1940 MRN: [id number]  Primary Cardiologist: Carlyle Dolly, MD  Chart reviewed as part of pre-operative protocol coverage. Given past medical history and time since last visit, based on ACC/AHA guidelines, Nancy Liu would be at acceptable risk for the planned procedure without further cardiovascular testing.   She has a hx of BMS to the RCA 2001 with most recent stress test 03/2020 with no ischemia. She is on ASA and may hold this for 3-5 days if needed for her surgery and resume once stable from bleeding standpoint thereafter. She was last seen 11/14/20 and was doing well from a CV standpoint with no recent anginal symptoms. I personally spoke with the patient today and she reports no change from last OV. She follows with pulmonary for a hx of heavy tobacco use with abnormal PFTs. She will see them tomorrow 01/25/21 for pulmonary clearance.   The patient was advised that if she develops new symptoms prior to surgery to contact our office to arrange for a follow-up visit, and she verbalized understanding.  I will route this recommendation to the requesting party via Epic fax function and remove from pre-op pool.  Please call with questions.  Kathyrn Drown, NP 01/24/2021, 11:10 AM

## 2021-01-24 NOTE — Telephone Encounter (Signed)
   [facility] Pre-operative Risk Assessment    Patient Name: Nancy Liu  DOB: Nov 11, 1940 MRN: [id number]  HEARTCARE STAFF:  - IMPORTANT!!!!!! Under Visit Info/Reason for Call, type in Other and utilize the format Clearance MM/DD/YY or Clearance TBD. Do not use dashes or single digits. - Please review there is not already an duplicate clearance open for this procedure. - If request is for dental extraction, please clarify the # of teeth to be extracted. - If the patient is currently at the dentist's office, call Pre-Op Callback Staff (MA/nurse) to input urgent request.  - If the patient is not currently in the dentist office, please route to the Pre-Op pool.  Request for surgical clearance:  What type of surgery is being performed? Breast cancer surgery   When is this surgery scheduled? ASAP  What type of clearance is required (medical clearance vs. Pharmacy clearance to hold med vs. Both)? BOTH  Are there any medications that need to be held prior to surgery and how long? Please advise if any medications need held   Practice name and name of physician performing surgery? [facility] - no Dr listed   What is the office phone number? [phone]   7.   What is the office fax number? [phone]  8.   Anesthesia type (None, local, MAC, general) ? general   Jannet Askew 01/24/2021, 10:18 AM  _________________________________________________________________   (provider comments below)

## 2021-01-25 ENCOUNTER — Ambulatory Visit: Payer: PPO | Admitting: Pulmonary Disease

## 2021-01-25 ENCOUNTER — Encounter: Payer: Self-pay | Admitting: Pulmonary Disease

## 2021-01-25 ENCOUNTER — Other Ambulatory Visit: Payer: Self-pay

## 2021-01-25 VITALS — BP 142/86 | HR 84 | Temp 97.4°F | Ht 62.5 in | Wt 150.0 lb

## 2021-01-25 DIAGNOSIS — Z01811 Encounter for preprocedural respiratory examination: Secondary | ICD-10-CM | POA: Diagnosis not present

## 2021-01-25 DIAGNOSIS — Z8669 Personal history of other diseases of the nervous system and sense organs: Secondary | ICD-10-CM

## 2021-01-25 DIAGNOSIS — J479 Bronchiectasis, uncomplicated: Secondary | ICD-10-CM | POA: Diagnosis not present

## 2021-01-25 NOTE — Progress Notes (Addendum)
[facility]  Chief Complaint  Patient presents with   Follow-up    Breathing is overall doing well. She states has some night time cough esp if lies flat so sleeping propped up on 5 pillows. Cough is occ prod with minimal clear sputum.     Constitutional:  BP (!) 142/86 (BP Location: Left Arm, Cuff Size: Normal)   Pulse 84   Temp (!) 97.4 F (36.3 C) (Oral)   Ht 5' 2.5" (1.588 m)   Wt 150 lb (68 kg)   SpO2 100% Comment: on RA  BMI 27.00 kg/m   Past Medical History:  Rt breast cancer, CAD, HTN, HLD, CVA, GERD, Thalassemia minor, GI bleed from AVM  Past Surgical History:  She  has a past surgical history that includes Vaginal hysterectomy (1980); Colonoscopy (2006); Inguinal hernia repair (1970s); Dilation and curettage of uterus; Colonoscopy (2000); Esophagogastroduodenoscopy (2000); Esophagogastroduodenoscopy (2006); Colonoscopy (N/A, 03/03/2016); Esophagogastroduodenoscopy (N/A, 03/03/2016); Esophagogastroduodenoscopy (N/A, 06/03/2016); Excision of keloid (Left, 03/26/2018); Esophagogastroduodenoscopy (egd) with propofol (N/A, 02/28/2020); biopsy (02/28/2020); and Givens capsule study (N/A, 10/10/2020).  Brief Summary:  Nancy Liu is a 80 y.o. female former smoker with dyspnea in setting of bronchiectasis      Subjective:   She was recently diagnosed with Rt breast cancer.  She has been seen by Dr. Truitt Merle with [facility].  She is being assess for mastectomy with Dr. Rolm Bookbinder.  She had chest xray on Oct 03, 2020 that was clear.  She has allergies with sinus congestion.  She will have sinus drainage and then cough when she lays down.  She doesn't feel this comes from her chest.  Not having fever, chest pain, wheeze, or hemoptysis.  She does okay with walking a slow pace, but gets tired if she does too much.  She snores some and gets cramps in her legs at night.  Physical Exam:   Appearance - well kempt   ENMT -  no sinus tenderness, no oral exudate, no LAN, Mallampati 3 airway, no stridor, poor dentition  Respiratory - equal breath sounds bilaterally, no wheezing or rales  CV - s1s2 regular rate and rhythm, no murmurs  Ext - no clubbing, no edema  Skin - no rashes  Psych - normal mood and affect   Pulmonary testing:  PFT 06/13/15 >> FEV1 1.83 (112%), FEV1% 85, TLC 3.82 (79%), DLCO 47% IgE 10/13/19 >> 16 IgG/IgA/IgM 10/13/19 >> 735/168/27 Serology 10/13/19 >> RF/ANA/ANCA negative PFT 11/08/19 >> FEV1 1.80 (118%), FEV1% 89, TLC 3.91 (81%), DLCO 77%  Chest Imaging:  HRCT chest 09/03/15 >> few scattered nodules up to 9 mm, mild GGO and minimal BTX with distortion in lingula and lower lobes from scarring HRCT chest 10/20/19 >> atherosclerosis, tubular BTX b/l lower lobes, scarring LLL and lingular, scattered nodules up to 9 mm stable since 2017/benign  Sleep Tests:  PSG 09/09/17 >> AHI 8.6, SpO2 low 87%  Cardiac Tests:  Echo 03/14/16 >> EF 55 to 60%, mod LVH, grade 1 DD  Social History:  She  reports that she quit smoking about 40 years ago. Her smoking use included cigarettes. She started smoking about 55 years ago. She has a 18.00 pack-year smoking history. She has never used smokeless tobacco. She reports that she does not drink alcohol and does not use drugs.  Family History:  Her family history includes Breast cancer (age of onset: 36) in her half-sister; Diabetes in her half-sister; Heart disease in her mother; Lung  cancer in her mother; Multiple myeloma (age of onset: 30) in her nephew; Ovarian cancer in her mother.     Assessment/Plan:   Right breast cancer. - there are no pulmonary contraindications for her to proceed with surgery - she can proceed with surgery  Bronchiectasis, history of smoking and reflux. - minimal symptoms - recent chest xray was unremarkable - prn mucinex  Allergic rhinitis with post nasal drip. - prn flonase  History of mild sleep apnea. - explained how  she might still have sleep apnea, but this wouldn't preclude her from proceeding with surgery at this time - she will need close monitoring of her oxygenation after surgery - will reassess her status after she recovers from surgery  Time Spent Involved in Patient Care on Day of Examination:  31 minutes  Follow up:   Patient Instructions  Okay to proceed with surgery  Follow up in 6 months  Medication List:   Allergies as of 01/25/2021       Reactions   Protonix [pantoprazole Sodium]    DRY LIPS   Shellfish Allergy    Unknown reaction        Medication List        Accurate as of January 25, 2021  9:11 AM. If you have any questions, ask your nurse or doctor.          amLODipine 10 MG tablet Commonly known as: NORVASC Take 1 tablet (10 mg total) by mouth daily.   aspirin EC 81 MG tablet Take 81 mg by mouth daily. Swallow whole.   augmented betamethasone dipropionate 0.05 % ointment Commonly known as: DIPROLENE-AF Apply 1 application topically 2 (two) times daily as needed (skin itching/dryness).   benzonatate 100 MG capsule Commonly known as: TESSALON Take 1-2 capsules (100-200 mg total) by mouth 3 (three) times daily as needed.   carvedilol 6.25 MG tablet Commonly known as: COREG TAKE ONE TABLET BY MOUTH BEFORE BREAKFAST and TAKE ONE TABLET BY MOUTH EVERYDAY AT BEDTIME   cetirizine 5 MG tablet Commonly known as: ZYRTEC Take 1 tablet (5 mg total) by mouth daily. What changed:  when to take this reasons to take this   CoQ10 Maximum Strength 400 MG Caps Generic drug: Coenzyme Q10 Take 400 mg by mouth every morning.   doxepin 10 MG capsule Commonly known as: SINEQUAN Take 10 mg by mouth at bedtime.   ergocalciferol 1.25 MG (50000 UT) capsule Commonly known as: VITAMIN D2 Take 1 capsule (50,000 Units total) by mouth once a week.   ferrous sulfate 325 (65 FE) MG tablet Take 325 mg by mouth daily.   irbesartan-hydrochlorothiazide 300-12.5 MG  tablet Commonly known as: AVALIDE Take 1 tablet by mouth daily.   lansoprazole 30 MG capsule Commonly known as: PREVACID Take 1 capsule (30 mg total) by mouth 2 (two) times daily before a meal.   Latanoprostene Bunod 0.024 % Soln Place 1 drop into both eyes See admin instructions. Instill 1 drop into both eyes 3 times a week at bedtime.   metFORMIN 1000 MG tablet Commonly known as: GLUCOPHAGE Take 500 mg by mouth daily.   nitroGLYCERIN 0.4 MG SL tablet Commonly known as: NITROSTAT Place 1 tablet (0.4 mg total) under the tongue every 5 (five) minutes as needed for chest pain.   rosuvastatin 20 MG tablet Commonly known as: CRESTOR TAKE ONE TABLET BY MOUTH EVERYDAY AT BEDTIME   SENNA S PO Take 2 tablets by mouth as needed.        Signature:  Chesley Mires, MD [place] Pager - [phone] 01/25/2021, 9:11 AM

## 2021-01-25 NOTE — Patient Instructions (Signed)
Okay to proceed with surgery Follow up in 6 months 

## 2021-01-27 ENCOUNTER — Other Ambulatory Visit: Payer: Self-pay | Admitting: Cardiology

## 2021-01-30 DIAGNOSIS — I10 Essential (primary) hypertension: Secondary | ICD-10-CM | POA: Diagnosis not present

## 2021-01-30 DIAGNOSIS — E119 Type 2 diabetes mellitus without complications: Secondary | ICD-10-CM | POA: Diagnosis not present

## 2021-01-30 DIAGNOSIS — E7849 Other hyperlipidemia: Secondary | ICD-10-CM | POA: Diagnosis not present

## 2021-01-30 DIAGNOSIS — K219 Gastro-esophageal reflux disease without esophagitis: Secondary | ICD-10-CM | POA: Diagnosis not present

## 2021-01-31 ENCOUNTER — Encounter: Payer: Self-pay | Admitting: *Deleted

## 2021-01-31 ENCOUNTER — Telehealth: Payer: Self-pay | Admitting: *Deleted

## 2021-01-31 ENCOUNTER — Telehealth: Payer: Self-pay | Admitting: Hematology

## 2021-01-31 NOTE — Telephone Encounter (Signed)
Scheduled per sch msg. Called and spoke with patient. Confirmed appt  

## 2021-01-31 NOTE — Telephone Encounter (Signed)
Left vm regarding [facility]C from 8.24.22. Contact information provided for questions or needs.

## 2021-02-05 ENCOUNTER — Telehealth: Payer: Self-pay | Admitting: Pulmonary Disease

## 2021-02-05 NOTE — Telephone Encounter (Signed)
Called and spoke with patient who states that she has had a cough for the last week and a half that comes and goes. States that it is a dry cough. Patient states that she went to Urgent care in July and was told it was allergies. She states that sometimes it comes in spells and she will cough a lot and then it stops. Pharmacy is [facility]  Dr. Halford Chessman please advise

## 2021-02-06 MED ORDER — BENZONATATE 100 MG PO CAPS: 200.0000 mg | ORAL_CAPSULE | Freq: Three times a day (TID) | ORAL | 0 refills | Status: DC | PRN

## 2021-02-06 NOTE — Telephone Encounter (Deleted)
Called and spoke with Patient. Dr. Juanetta Gosling recommendation's given.  Understanding stated.  Patient stated she spoke with someone from [place] office earlier with recommendations. Nothing further needed at this time.

## 2021-02-06 NOTE — Telephone Encounter (Signed)
She should use zyrtec daily.  She can use benzonatate every 8 hours as needed.  If cough persists, then she needs an ROV.

## 2021-02-06 NOTE — Telephone Encounter (Signed)
Called and spoke with patient and to let her know the recommendations from Dr. Halford Chessman. She expressed understanding and stated that she would need refill on her Benzonatate. She verified preferred pharmacy, RX sent. Advised her if cough persist then she needs to call us back and get scheduled for an OV. She expressed understanding. Nothing further needed at this time.

## 2021-02-12 ENCOUNTER — Other Ambulatory Visit: Payer: Self-pay

## 2021-02-12 ENCOUNTER — Ambulatory Visit: Payer: PPO | Admitting: Primary Care

## 2021-02-12 ENCOUNTER — Ambulatory Visit (HOSPITAL_COMMUNITY)
Admission: RE | Admit: 2021-02-12 | Discharge: 2021-02-12 | Disposition: A | Discharge status: Home / Self Care | Payer: PPO | Source: Ambulatory Visit | Attending: Pulmonary Disease | Admitting: Pulmonary Disease

## 2021-02-12 ENCOUNTER — Encounter: Payer: Self-pay | Admitting: Genetic Counselor

## 2021-02-12 ENCOUNTER — Telehealth: Payer: Self-pay | Admitting: Genetic Counselor

## 2021-02-12 ENCOUNTER — Encounter: Payer: Self-pay | Admitting: Pulmonary Disease

## 2021-02-12 ENCOUNTER — Ambulatory Visit: Payer: Self-pay | Admitting: Genetic Counselor

## 2021-02-12 ENCOUNTER — Ambulatory Visit: Payer: PPO | Admitting: Pulmonary Disease

## 2021-02-12 VITALS — BP 124/76 | HR 85 | Temp 98.3°F | Ht 62.5 in | Wt 149.1 lb

## 2021-02-12 DIAGNOSIS — R059 Cough, unspecified: Secondary | ICD-10-CM | POA: Diagnosis not present

## 2021-02-12 DIAGNOSIS — Z803 Family history of malignant neoplasm of breast: Secondary | ICD-10-CM

## 2021-02-12 DIAGNOSIS — Z1379 Encounter for other screening for genetic and chromosomal anomalies: Secondary | ICD-10-CM | POA: Insufficient documentation

## 2021-02-12 DIAGNOSIS — Z8041 Family history of malignant neoplasm of ovary: Secondary | ICD-10-CM

## 2021-02-12 DIAGNOSIS — D0511 Intraductal carcinoma in situ of right breast: Secondary | ICD-10-CM

## 2021-02-12 MED ORDER — ALBUTEROL SULFATE HFA 108 (90 BASE) MCG/ACT IN AERS: 2.0000 | INHALATION_SPRAY | Freq: Four times a day (QID) | RESPIRATORY_TRACT | 2 refills | Status: DC | PRN

## 2021-02-12 MED ORDER — AZITHROMYCIN 250 MG PO TABS: ORAL_TABLET | ORAL | 0 refills | Status: AC

## 2021-02-12 NOTE — Progress Notes (Signed)
HPI:  Ms. Gianino was previously seen in the [facility] clinic due to a personal and family history of cancer and concerns regarding a hereditary predisposition to cancer. Please refer to our prior cancer genetics clinic note for more information regarding our discussion, assessment and recommendations, at the time. Ms. Inclan's recent genetic test results were disclosed to her, as were recommendations warranted by these results. These results and recommendations are discussed in more detail below.  CANCER HISTORY:  Oncology History  Ductal carcinoma in situ (DCIS) of right breast  01/01/2021 Mammogram   FINDINGS: Multiple circumscribed oval masses are noted in the MEDIAL portion of the RIGHT breast. Masses vary from 1-4 millimeters in diameter and appear contiguous, spanning 8.9 x 3.6 x 5.1 centimeters. There are faint calcifications spanning 4.2 x 1.9 x 3.5 centimeters in this same, segmental distribution.   01/14/2021 Cancer Staging   Staging form: Breast, AJCC 8th Edition - Clinical stage from 01/14/2021: Stage 0 (cTis (DCIS), cN0, cM0, G2, ER+, PR+, HER2: Not Assessed) - Signed by Truitt Merle, MD on 01/21/2021 Stage prefix: Initial diagnosis Histologic grading system: 3 grade system   01/14/2021 Pathology Results   Diagnosis 1. Breast, right, needle core biopsy, anterior extent - DUCTAL CARCINOMA IN SITU, INTERMEDIATE GRADE WITH CALCIFICATIONS. SEE NOTE 2. Breast, right, needle core biopsy, posterior extent - DUCTAL CARCINOMA IN SITU, INTERMEDIATE GRADE WITH CALCIFICATIONS. SEE NOTE Diagnosis Note 1. and 2. DCIS measures 0.6 cm in part 1 and 0.4 cm in part 2.  1. PROGNOSTIC INDICATORS Results: IMMUNOHISTOCHEMICAL AND MORPHOMETRIC ANALYSIS PERFORMED MANUALLY Estrogen Receptor: >95%, POSITIVE, STRONG STAINING INTENSITY Progesterone Receptor: >95%, POSITIVE, STRONG STAINING INTENSITY     01/17/2021 Initial Diagnosis   Ductal carcinoma in situ (DCIS) of right breast    02/05/2021 Genetic Testing   Negative hereditary cancer genetic testing: no pathogenic variants detected in [facility] CustomNext-Cancer +RNAinsight Panel.  The report date is February 05, 2021.   The CustomNext-Cancer+RNAinsight panel offered by [facility] includes sequencing and rearrangement analysis for the following 47 genes:  APC, ATM, AXIN2, BARD1, BMPR1A, BRCA1, BRCA2, BRIP1, CDH1, CDK4, CDKN2A, CHEK2, DICER1, EPCAM, GREM1, HOXB13, MEN1, MLH1, MSH2, MSH3, MSH6, MUTYH, NBN, NF1, NF2, NTHL1, PALB2, PMS2, POLD1, POLE, PTEN, RAD51C, RAD51D, RECQL, RET, SDHA, SDHAF2, SDHB, SDHC, SDHD, SMAD4, SMARCA4, STK11, TP53, TSC1, TSC2, and VHL.  RNA data is routinely analyzed for use in variant interpretation for all genes.     FAMILY HISTORY:  We obtained a detailed, 4-generation family history.  Significant diagnoses are listed below: Family History  Problem Relation Age of Onset   Lung cancer Mother        dx 40s   Ovarian cancer Mother        dx before 76   Diabetes Half-Sister    Breast cancer Half-Sister 33   Multiple myeloma Nephew 63       war-related exposures    Ms. Luepke is unaware of previous family history of genetic testing for hereditary cancer risks. There is no reported Ashkenazi Jewish ancestry. There is no known consanguinity.   GENETIC TEST RESULTS: Genetic testing reported out on February 05, 2021. The CustomNext-Cancer +RNAinsight Panel found no pathogenic mutations. The CustomNext-Cancer+RNAinsight panel offered by [facility] includes sequencing and rearrangement analysis for the following 47 genes:  APC, ATM, AXIN2, BARD1, BMPR1A, BRCA1, BRCA2, BRIP1, CDH1, CDK4, CDKN2A, CHEK2, DICER1, EPCAM, GREM1, HOXB13, MEN1, MLH1, MSH2, MSH3, MSH6, MUTYH, NBN, NF1, NF2, NTHL1, PALB2, PMS2, POLD1, POLE, PTEN, RAD51C, RAD51D, RECQL, RET, SDHA,  SDHAF2, SDHB, SDHC, SDHD, SMAD4, SMARCA4, STK11, TP53, TSC1, TSC2, and VHL.  RNA data is routinely analyzed for use in variant interpretation for  all genes.  The test report has been scanned into EPIC and is located under the Molecular Pathology section of the Results Review tab.  A portion of the result report is included below for reference.    We discussed with Ms. Wiater that because current genetic testing is not perfect, it is possible there may be a gene mutation in one of these genes that current testing cannot detect, but that chance is small.  We also discussed, that there could be another gene that has not yet been discovered, or that we have not yet tested, that is responsible for the cancer diagnoses in the family. It is also possible there is a hereditary cause for the cancer in the family that Ms. Ramaker did not inherit and therefore was not identified in her testing.  Therefore, it is important to remain in touch with cancer genetics in the future so that we can continue to offer Ms. Tercero the most up to date genetic testing.   ADDITIONAL GENETIC TESTING: We discussed with Ms. Khaimov that there are other genes that are associated with increased cancer risk that can be analyzed. Should Ms. Thayer wish to pursue additional genetic testing, we are happy to discuss and coordinate this testing, at any time.     CANCER SCREENING RECOMMENDATIONS: Ms. Folta test result is considered negative (normal).  This means that we have not identified a hereditary cause for her personal history of cancer at this time. Most cancers are sporadic/familial, and this negative test suggests that her cancer may fall into this category.    While reassuring, this does not definitively rule out a hereditary predisposition to cancer. It is still possible that there could be genetic mutations that are undetectable by current technology. There could be genetic mutations in genes that have not been tested or identified to increase cancer risk.  Therefore, it is recommended she continue to follow the cancer management and screening guidelines provided by her  oncology and primary healthcare provider.   An individual's cancer risk and medical management are not determined by genetic test results alone. Overall cancer risk assessment incorporates additional factors, including personal medical history, family history, and any available genetic information that may result in a personalized plan for cancer prevention and surveillance   RECOMMENDATIONS FOR FAMILY MEMBERS:  Individuals in this family might be at some increased risk of developing cancer, over the general population risk, simply due to the family history of cancer.  We recommended women in this family have a yearly mammogram beginning at age 42, or 38 years younger than the earliest onset of cancer, an annual clinical breast exam, and perform monthly breast self-exams. Women in this family should also have a gynecological exam as recommended by their primary provider. Family members should be referred for colonoscopy starting at age 4, or earlier, as recommended by their providers.    It is also possible there is a hereditary cause for the cancer in Ms. Pinson's family that she did not inherit and therefore was not identified in her.  Based on Ms. Gaultney's family history, we recommended her sister, who was diagnosed with breast cancer, have genetic counseling and testing. Ms. Malicoat will let us know if we can be of any assistance in coordinating genetic counseling and/or testing for this family member.   FOLLOW-UP: Lastly, we discussed with Ms.  Heater that cancer genetics is a rapidly advancing field and it is possible that new genetic tests will be appropriate for her and/or her family members in the future. We encouraged her to remain in contact with cancer genetics on an annual basis so we can update her personal and family histories and let her know of advances in cancer genetics that may benefit this family.   Our contact number was provided. Ms. Stalnaker's questions were answered to her  satisfaction, and she knows she is welcome to call us at anytime with additional questions or concerns.     Zahli Vetsch M. Joette Catching, [place], [facility] Genetic Counselor [email or web address] (P) [phone]

## 2021-02-12 NOTE — Telephone Encounter (Signed)
Revealed negative genetic testing.  Discussed that we do not know why she has breast cancer or why there is cancer in the family. It could be familial, due to a different gene that we are not testing, or maybe our current technology may not be able to pick something up.  It will be important for her to keep in contact with genetics to keep up with whether additional testing may be needed.

## 2021-02-12 NOTE — Patient Instructions (Signed)
Chest xray today  Albuterol two puffs every 6 hours as needed for cough, wheeze, or chest congestion  Zithromax 250 mg pill >> 2 pills on day 1, then 1 pill daily for next 4 days  Follow up in 4 months

## 2021-02-12 NOTE — Progress Notes (Signed)
[facility]  Chief Complaint  Patient presents with   Follow-up    Increase SOB and dry cough since last seen. Coughing causes chest pain. Feels like there is mucus she needs to get up.     Constitutional:  BP 124/76 (BP Location: Left Arm, Patient Position: Sitting)   Pulse 85   Temp 98.3 F (36.8 C) (Oral)   Ht 5' 2.5" (1.588 m)   Wt 149 lb 1.9 oz (67.6 kg)   SpO2 100%   BMI 26.84 kg/m   Past Medical History:  Rt breast cancer, CAD, HTN, HLD, CVA, GERD, Thalassemia minor, GI bleed from AVM  Past Surgical History:  She  has a past surgical history that includes Vaginal hysterectomy (1980); Colonoscopy (2006); Inguinal hernia repair (1970s); Dilation and curettage of uterus; Colonoscopy (2000); Esophagogastroduodenoscopy (2000); Esophagogastroduodenoscopy (2006); Colonoscopy (N/A, 03/03/2016); Esophagogastroduodenoscopy (N/A, 03/03/2016); Esophagogastroduodenoscopy (N/A, 06/03/2016); Excision of keloid (Left, 03/26/2018); Esophagogastroduodenoscopy (egd) with propofol (N/A, 02/28/2020); biopsy (02/28/2020); and Givens capsule study (N/A, 10/10/2020).  Brief Summary:  Nancy Liu is a 80 y.o. female former smoker with dyspnea in setting of bronchiectasis      Subjective:   She developed cough and left side chest pain.  Going on for past week.  Has tried benzonatate and allergy medicines, but not better.  Not having sinus congestion or sore throat.  No fever.  Feels congested in her chest, but can't cough up sputum.  Physical Exam:   Appearance - well kempt   ENMT - no sinus tenderness, no oral exudate, no LAN, Mallampati 3 airway, no stridor, poor dentition  Respiratory - equal breath sounds bilaterally, no wheezing or rales  CV - s1s2 regular rate and rhythm, no murmurs  Ext - no clubbing, no edema  Skin - no rashes  Psych - normal mood and affect    Pulmonary testing:  PFT 06/13/15 >> FEV1 1.83 (112%), FEV1% 85, TLC 3.82  (79%), DLCO 47% IgE 10/13/19 >> 16 IgG/IgA/IgM 10/13/19 >> 735/168/27 Serology 10/13/19 >> RF/ANA/ANCA negative PFT 11/08/19 >> FEV1 1.80 (118%), FEV1% 89, TLC 3.91 (81%), DLCO 77%  Chest Imaging:  HRCT chest 09/03/15 >> few scattered nodules up to 9 mm, mild GGO and minimal BTX with distortion in lingula and lower lobes from scarring HRCT chest 10/20/19 >> atherosclerosis, tubular BTX b/l lower lobes, scarring LLL and lingular, scattered nodules up to 9 mm stable since 2017/benign  Sleep Tests:  PSG 09/09/17 >> AHI 8.6, SpO2 low 87%  Cardiac Tests:  Echo 03/14/16 >> EF 55 to 60%, mod LVH, grade 1 DD  Social History:  She  reports that she quit smoking about 40 years ago. Her smoking use included cigarettes. She started smoking about 55 years ago. She has a 18.00 pack-year smoking history. She has never used smokeless tobacco. She reports that she does not drink alcohol and does not use drugs.  Family History:  Her family history includes Breast cancer (age of onset: 38) in her half-sister; Diabetes in her half-sister; Heart disease in her mother; Lung cancer in her mother; Multiple myeloma (age of onset: 45) in her nephew; Ovarian cancer in her mother.     Assessment/Plan:   Right breast cancer. - she is scheduled for surgery with Dr. Rolm Bookbinder next week  Bronchiectasis, history of smoking and reflux. - has mild exacerbation - chest xray today - course of zithromax - prn albuterol - if chest xray is unremarkable and she is symptomatically improved with antibiotic,  then she should be able to proceed with surgery - prn tessalon - advised her to defer influenza vaccine and COVID booster until she has recovered from surgery  Allergic rhinitis with post nasal drip. - prn flonase, zyrtec  History of mild sleep apnea. - explained how she might still have sleep apnea, but this wouldn't preclude her from proceeding with surgery at this time - she will need close monitoring of her  oxygenation after surgery - will reassess her status after she recovers from surgery  Time Spent Involved in Patient Care on Day of Examination:  32 minutes  Follow up:   Patient Instructions  Chest xray today  Albuterol two puffs every 6 hours as needed for cough, wheeze, or chest congestion  Zithromax 250 mg pill >> 2 pills on day 1, then 1 pill daily for next 4 days  Follow up in 4 months  Medication List:   Allergies as of 02/12/2021       Reactions   Protonix [pantoprazole Sodium]    DRY LIPS   Shellfish Allergy    Unknown reaction        Medication List        Accurate as of February 12, 2021 11:34 AM. If you have any questions, ask your nurse or doctor.          albuterol 108 (90 Base) MCG/ACT inhaler Commonly known as: VENTOLIN HFA Inhale 2 puffs into the lungs every 6 (six) hours as needed for wheezing or shortness of breath. Started by: Chesley Mires, MD   amLODipine 10 MG tablet Commonly known as: NORVASC TAKE ONE TABLET BY MOUTH EVERYDAY AT BEDTIME   aspirin EC 81 MG tablet Take 81 mg by mouth daily. Swallow whole.   augmented betamethasone dipropionate 0.05 % ointment Commonly known as: DIPROLENE-AF Apply 1 application topically 2 (two) times daily as needed (skin itching/dryness).   azithromycin 250 MG tablet Commonly known as: ZITHROMAX Take 2 tablets (500 mg total) by mouth daily for 1 day, THEN 1 tablet (250 mg total) daily for 4 days. Start taking on: February 12, 2021 Started by: Chesley Mires, MD   benzonatate 100 MG capsule Commonly known as: TESSALON Take 2 capsules (200 mg total) by mouth every 8 (eight) hours as needed.   carvedilol 6.25 MG tablet Commonly known as: COREG TAKE ONE TABLET BY MOUTH BEFORE BREAKFAST and TAKE ONE TABLET BY MOUTH EVERYDAY AT BEDTIME   cetirizine 5 MG tablet Commonly known as: ZYRTEC Take 1 tablet (5 mg total) by mouth daily. What changed:  when to take this reasons to take this   CoQ10  Maximum Strength 400 MG Caps Generic drug: Coenzyme Q10 Take 400 mg by mouth every morning.   doxepin 10 MG capsule Commonly known as: SINEQUAN Take 10 mg by mouth at bedtime.   ergocalciferol 1.25 MG (50000 UT) capsule Commonly known as: VITAMIN D2 Take 1 capsule (50,000 Units total) by mouth once a week.   ferrous sulfate 325 (65 FE) MG tablet Take 325 mg by mouth daily.   irbesartan-hydrochlorothiazide 300-12.5 MG tablet Commonly known as: AVALIDE Take 1 tablet by mouth daily.   lansoprazole 30 MG capsule Commonly known as: PREVACID Take 1 capsule (30 mg total) by mouth 2 (two) times daily before a meal.   Latanoprostene Bunod 0.024 % Soln Place 1 drop into both eyes See admin instructions. Instill 1 drop into both eyes 3 times a week at bedtime.   metFORMIN 1000 MG tablet Commonly known as: GLUCOPHAGE  Take 500 mg by mouth daily.   nitroGLYCERIN 0.4 MG SL tablet Commonly known as: NITROSTAT Place 1 tablet (0.4 mg total) under the tongue every 5 (five) minutes as needed for chest pain.   rosuvastatin 20 MG tablet Commonly known as: CRESTOR TAKE ONE TABLET BY MOUTH EVERYDAY AT BEDTIME   SENNA S PO Take 2 tablets by mouth as needed.        Signature:  Chesley Mires, MD [facility] Pager - [phone] 02/12/2021, 11:34 AM

## 2021-02-15 NOTE — Pre-Procedure Instructions (Signed)
Surgical Instructions    Your procedure is scheduled on Wednesday 02/20/21.   Report to [facility] Main Entrance "A" at 09:00 A.M., then check in with the Admitting office.  Call this number if you have problems the morning of surgery:  [phone]   If you have any questions prior to your surgery date call [phone]: Open Monday-Friday 8am-4pm    Remember:  Do not eat after midnight the night before your surgery  You may drink clear liquids until 08:00 A.M. the morning of your surgery.   Clear liquids allowed are: Water, Non-Citrus Juices (without pulp), Carbonated Beverages, Clear Tea, Black Coffee ONLY (NO MILK, CREAM OR POWDERED CREAMER of any kind), and Gatorade  Patient Instructions  The night before surgery:  No food after midnight. ONLY clear liquids after midnight   The day of surgery (if you have diabetes): Drink ONE (1) 12 oz G2 given to you in your pre admission testing appointment by 08:00 A.M. the morning of surgery. Drink in one sitting. Do not sip.  This drink was given to you during your hospital  pre-op appointment visit.  Nothing else to drink after completing the  12 oz bottle of G2.         If you have questions, please contact your surgeon's office.     Take these medicines the morning of surgery with A SIP OF WATER   carvedilol (COREG)      Take these medicines if needed:   acetaminophen (TYLENOL)  albuterol (VENTOLIN HFA)   benzonatate (TESSALON)  cetirizine (ZYRTEC)  nitroGLYCERIN (NITROSTAT)   sennosides-docusate sodium (SENOKOT-S)   As of today, STOP taking any Aspirin (unless otherwise instructed by your surgeon) Aleve, Naproxen, Ibuprofen, Motrin, Advil, Goody's, BC's, all herbal medications, fish oil, and all vitamins.  WHAT DO I DO ABOUT MY DIABETES MEDICATION?   Do not take oral diabetes medicines (pills) the morning of surgery. Do not take metFORMIN (GLUCOPHAGE) the morning of surgery.   HOW TO MANAGE YOUR DIABETES BEFORE AND  AFTER SURGERY  Why is it important to control my blood sugar before and after surgery? Improving blood sugar levels before and after surgery helps healing and can limit problems. A way of improving blood sugar control is eating a healthy diet by:  Eating less sugar and carbohydrates  Increasing activity/exercise  Talking with your doctor about reaching your blood sugar goals High blood sugars (greater than 180 mg/dL) can raise your risk of infections and slow your recovery, so you will need to focus on controlling your diabetes during the weeks before surgery. Make sure that the doctor who takes care of your diabetes knows about your planned surgery including the date and location.  How do I manage my blood sugar before surgery? Check your blood sugar at least 4 times a day, starting 2 days before surgery, to make sure that the level is not too high or low.  Check your blood sugar the morning of your surgery when you wake up and every 2 hours until you get to the Short Stay unit.  If your blood sugar is less than 70 mg/dL, you will need to treat for low blood sugar: Do not take insulin. Treat a low blood sugar (less than 70 mg/dL) with  cup of clear juice (cranberry or apple), 4 glucose tablets, OR glucose gel. Recheck blood sugar in 15 minutes after treatment (to make sure it is greater than 70 mg/dL). If your blood sugar is not greater than 70 mg/dL on  recheck, call [phone] for further instructions. Report your blood sugar to the short stay nurse when you get to Short Stay.  If you are admitted to the hospital after surgery: Your blood sugar will be checked by the staff and you will probably be given insulin after surgery (instead of oral diabetes medicines) to make sure you have good blood sugar levels. The goal for blood sugar control after surgery is 80-180 mg/dL.           Do not wear jewelry or makeup Do not wear lotions, powders, perfumes/colognes, or deodorant. Do not shave  48 hours prior to surgery.  Men may shave face and neck. Do not bring valuables to the hospital. DO Not wear nail polish, gel polish, artificial nails, or any other type of covering on natural nails including finger and toenails. If patients have artificial nails, gel coating, etc. that need to be removed by a nail salon please have this removed prior to surgery or surgery may need to be canceled/delayed if the surgeon/ anesthesia feels like the patient is unable to be adequately monitored.             [facility] is not responsible for any belongings or valuables.  Do NOT Smoke (Tobacco/Vaping)  24 hours prior to your procedure If you use a CPAP at night, you may bring your mask for your overnight stay.   Contacts, glasses, dentures or bridgework may not be worn into surgery, please bring cases for these belongings   For patients admitted to the hospital, discharge time will be determined by your treatment team.   Patients discharged the day of surgery will not be allowed to drive home, and someone needs to stay with them for 24 hours.  NO VISITORS WILL BE ALLOWED IN PRE-OP WHERE PATIENTS GET READY FOR SURGERY.  ONLY 1 SUPPORT PERSON MAY BE PRESENT WHILE YOU ARE IN SURGERY.  IF YOU ARE TO BE ADMITTED, ONCE YOU ARE IN YOUR ROOM YOU WILL BE ALLOWED TWO (2) VISITORS.  Minor children may have two parents present. Special consideration for safety and communication needs will be reviewed on a case by case basis.  Special instructions:    Oral Hygiene is also important to reduce your risk of infection.  Remember - BRUSH YOUR TEETH THE MORNING OF SURGERY WITH YOUR REGULAR TOOTHPASTE   [facility]- Preparing For Surgery  Before surgery, you can play an important role. Because skin is not sterile, your skin needs to be as free of germs as possible. You can reduce the number of germs on your skin by washing with CHG (chlorahexidine gluconate) Soap before surgery.  CHG is an antiseptic cleaner which  kills germs and bonds with the skin to continue killing germs even after washing.     Please do not use if you have an allergy to CHG or antibacterial soaps. If your skin becomes reddened/irritated stop using the CHG.  Do not shave (including legs and underarms) for at least 48 hours prior to first CHG shower. It is OK to shave your face.  Please follow these instructions carefully.     Shower the NIGHT BEFORE SURGERY and the MORNING OF SURGERY with CHG Soap.   If you chose to wash your hair, wash your hair first as usual with your normal shampoo. After you shampoo, rinse your hair and body thoroughly to remove the shampoo.  Then [employer] and genitals (private parts) with your normal soap and rinse thoroughly to remove soap.  After that Use CHG Soap as you would any other liquid soap. You can apply CHG directly to the skin and wash gently with a scrungie or a clean washcloth.   Apply the CHG Soap to your body ONLY FROM THE NECK DOWN.  Do not use on open wounds or open sores. Avoid contact with your eyes, ears, mouth and genitals (private parts). Wash Face and genitals (private parts)  with your normal soap.   Wash thoroughly, paying special attention to the area where your surgery will be performed.  Thoroughly rinse your body with warm water from the neck down.  DO NOT shower/wash with your normal soap after using and rinsing off the CHG Soap.  Pat yourself dry with a CLEAN TOWEL.  Wear CLEAN PAJAMAS to bed the night before surgery  Place CLEAN SHEETS on your bed the night before your surgery  DO NOT SLEEP WITH PETS.   Day of Surgery:  Take a shower with CHG soap. Wear Clean/Comfortable clothing the morning of surgery Do not apply any deodorants/lotions.   Remember to brush your teeth WITH YOUR REGULAR TOOTHPASTE.   Please read over the following fact sheets that you were given.

## 2021-02-18 ENCOUNTER — Encounter (HOSPITAL_COMMUNITY): Payer: Self-pay

## 2021-02-18 ENCOUNTER — Encounter (HOSPITAL_COMMUNITY)
Admission: RE | Admit: 2021-02-18 | Discharge: 2021-02-18 | Disposition: A | Discharge status: Home / Self Care | Payer: PPO | Source: Ambulatory Visit | Attending: General Surgery | Admitting: General Surgery

## 2021-02-18 ENCOUNTER — Other Ambulatory Visit: Payer: Self-pay

## 2021-02-18 DIAGNOSIS — Z7982 Long term (current) use of aspirin: Secondary | ICD-10-CM | POA: Insufficient documentation

## 2021-02-18 DIAGNOSIS — J449 Chronic obstructive pulmonary disease, unspecified: Secondary | ICD-10-CM | POA: Insufficient documentation

## 2021-02-18 DIAGNOSIS — C50911 Malignant neoplasm of unspecified site of right female breast: Secondary | ICD-10-CM | POA: Diagnosis not present

## 2021-02-18 DIAGNOSIS — Z01812 Encounter for preprocedural laboratory examination: Secondary | ICD-10-CM | POA: Diagnosis not present

## 2021-02-18 DIAGNOSIS — I10 Essential (primary) hypertension: Secondary | ICD-10-CM | POA: Diagnosis not present

## 2021-02-18 DIAGNOSIS — Z79899 Other long term (current) drug therapy: Secondary | ICD-10-CM | POA: Insufficient documentation

## 2021-02-18 DIAGNOSIS — I2510 Atherosclerotic heart disease of native coronary artery without angina pectoris: Secondary | ICD-10-CM | POA: Diagnosis not present

## 2021-02-18 DIAGNOSIS — G4733 Obstructive sleep apnea (adult) (pediatric): Secondary | ICD-10-CM | POA: Diagnosis not present

## 2021-02-18 DIAGNOSIS — Z20822 Contact with and (suspected) exposure to covid-19: Secondary | ICD-10-CM | POA: Diagnosis not present

## 2021-02-18 HISTORY — DX: Sleep apnea, unspecified: G47.30

## 2021-02-18 LAB — GLUCOSE, CAPILLARY: Glucose-Capillary: 110 mg/dL — ABNORMAL HIGH (ref 70–99)

## 2021-02-18 LAB — HEMOGLOBIN A1C
Hgb A1c MFr Bld: 5.7 % — ABNORMAL HIGH (ref 4.8–5.6)
Mean Plasma Glucose: 116.89 mg/dL

## 2021-02-18 LAB — SARS CORONAVIRUS 2 (TAT 6-24 HRS): SARS Coronavirus 2: NEGATIVE

## 2021-02-18 NOTE — Progress Notes (Signed)
PCP - Dr. Lucianne Lei Cardiologist - Dr. Harl Bowie  PPM/ICD - n/a  Chest x-ray - 02/12/21 EKG - 10/03/20 Stress Test - 03/30/20 ECHO - 03/14/16 Cardiac Cath - pt denies  Sleep Study - yes, mild sleep apnea CPAP - does not wear cpap  Fasting Blood Sugar - 88-102 per pt; 110 in PAT Checks Blood Sugar once a day  Blood Thinner Instructions: n/a Aspirin Instructions: As of today, STOP taking any Aspirin (unless otherwise instructed by your surgeon) Aleve, Naproxen, Ibuprofen, Motrin, Advil, Goody's, BC's, all herbal medications, fish oil, and all vitamins.  ERAS Protcol - yes; clear liquids until 0800 PRE-SURGERY Ensure or G2- small bottle of water  COVID TEST- 02/18/21 in PAT  Anesthesia review: yes, cardiac hx  Patient denies shortness of breath, fever, cough and chest pain at PAT appointment   All instructions explained to the patient, with a verbal understanding of the material. Patient agrees to go over the instructions while at home for a better understanding. Patient also instructed to self quarantine after being tested for COVID-19. The opportunity to ask questions was provided.

## 2021-02-18 NOTE — Pre-Procedure Instructions (Signed)
Surgical Instructions    Your procedure is scheduled on Wednesday 02/20/21.   Report to [facility] Main Entrance "A" at 09:00 A.M., then check in with the Admitting office.  Call this number if you have problems the morning of surgery:  [phone]   If you have any questions prior to your surgery date call [phone]: Open Monday-Friday 8am-4pm    Remember:  Do not eat after midnight the night before your surgery  You may drink clear liquids until 08:00 A.M. the morning of your surgery.   Clear liquids allowed are: Water, Non-Citrus Juices (without pulp), Carbonated Beverages, Clear Tea, Black Coffee ONLY (NO MILK, CREAM OR POWDERED CREAMER of any kind), and Gatorade  Patient Instructions  The night before surgery:  No food after midnight. ONLY clear liquids after midnight   The day of surgery (if you have diabetes): Drink ONE (1) 8 oz bottle of water given to you in your pre admission testing appointment by 08:00 A.M. the morning of surgery. Drink in one sitting. Do not sip.  This drink was given to you during your hospital  pre-op appointment visit.  Nothing else to drink after completing the  8 oz bottle of water.         If you have questions, please contact your surgeon's office.     Take these medicines the morning of surgery with A SIP OF WATER   carvedilol (COREG)      Take these medicines if needed:   acetaminophen (TYLENOL)  albuterol (VENTOLIN HFA)   benzonatate (TESSALON)  cetirizine (ZYRTEC)  nitroGLYCERIN (NITROSTAT)   sennosides-docusate sodium (SENOKOT-S)   As of today, STOP taking any Aspirin (unless otherwise instructed by your surgeon) Aleve, Naproxen, Ibuprofen, Motrin, Advil, Goody's, BC's, all herbal medications, fish oil, and all vitamins.  WHAT DO I DO ABOUT MY DIABETES MEDICATION?   Do not take oral diabetes medicines (pills) the morning of surgery. Do not take metFORMIN (GLUCOPHAGE) the morning of surgery.   HOW TO MANAGE YOUR  DIABETES BEFORE AND AFTER SURGERY  Why is it important to control my blood sugar before and after surgery? Improving blood sugar levels before and after surgery helps healing and can limit problems. A way of improving blood sugar control is eating a healthy diet by:  Eating less sugar and carbohydrates  Increasing activity/exercise  Talking with your doctor about reaching your blood sugar goals High blood sugars (greater than 180 mg/dL) can raise your risk of infections and slow your recovery, so you will need to focus on controlling your diabetes during the weeks before surgery. Make sure that the doctor who takes care of your diabetes knows about your planned surgery including the date and location.  How do I manage my blood sugar before surgery? Check your blood sugar at least 4 times a day, starting 2 days before surgery, to make sure that the level is not too high or low.  Check your blood sugar the morning of your surgery when you wake up and every 2 hours until you get to the Short Stay unit.  If your blood sugar is less than 70 mg/dL, you will need to treat for low blood sugar: Do not take insulin. Treat a low blood sugar (less than 70 mg/dL) with  cup of clear juice (cranberry or apple), 4 glucose tablets, OR glucose gel. Recheck blood sugar in 15 minutes after treatment (to make sure it is greater than 70 mg/dL). If your blood sugar is not greater than 70  mg/dL on recheck, call [phone] for further instructions. Report your blood sugar to the short stay nurse when you get to Short Stay.  If you are admitted to the hospital after surgery: Your blood sugar will be checked by the staff and you will probably be given insulin after surgery (instead of oral diabetes medicines) to make sure you have good blood sugar levels. The goal for blood sugar control after surgery is 80-180 mg/dL.           Do not wear jewelry or makeup Do not wear lotions, powders, perfumes/colognes, or  deodorant. Do not shave 48 hours prior to surgery.  Men may shave face and neck. Do not bring valuables to the hospital. DO Not wear nail polish, gel polish, artificial nails, or any other type of covering on natural nails including finger and toenails. If patients have artificial nails, gel coating, etc. that need to be removed by a nail salon please have this removed prior to surgery or surgery may need to be canceled/delayed if the surgeon/ anesthesia feels like the patient is unable to be adequately monitored.             [facility] is not responsible for any belongings or valuables.  Do NOT Smoke (Tobacco/Vaping)  24 hours prior to your procedure If you use a CPAP at night, you may bring your mask for your overnight stay.   Contacts, glasses, dentures or bridgework may not be worn into surgery, please bring cases for these belongings   For patients admitted to the hospital, discharge time will be determined by your treatment team.   Patients discharged the day of surgery will not be allowed to drive home, and someone needs to stay with them for 24 hours.  NO VISITORS WILL BE ALLOWED IN PRE-OP WHERE PATIENTS GET READY FOR SURGERY.  ONLY 1 SUPPORT PERSON MAY BE PRESENT WHILE YOU ARE IN SURGERY.  IF YOU ARE TO BE ADMITTED, ONCE YOU ARE IN YOUR ROOM YOU WILL BE ALLOWED TWO (2) VISITORS.  Minor children may have two parents present. Special consideration for safety and communication needs will be reviewed on a case by case basis.  Special instructions:    Oral Hygiene is also important to reduce your risk of infection.  Remember - BRUSH YOUR TEETH THE MORNING OF SURGERY WITH YOUR REGULAR TOOTHPASTE   [facility]- Preparing For Surgery  Before surgery, you can play an important role. Because skin is not sterile, your skin needs to be as free of germs as possible. You can reduce the number of germs on your skin by washing with CHG (chlorahexidine gluconate) Soap before surgery.  CHG is an  antiseptic cleaner which kills germs and bonds with the skin to continue killing germs even after washing.     Please do not use if you have an allergy to CHG or antibacterial soaps. If your skin becomes reddened/irritated stop using the CHG.  Do not shave (including legs and underarms) for at least 48 hours prior to first CHG shower. It is OK to shave your face.  Please follow these instructions carefully.     Shower the NIGHT BEFORE SURGERY and the MORNING OF SURGERY with CHG Soap.   If you chose to wash your hair, wash your hair first as usual with your normal shampoo. After you shampoo, rinse your hair and body thoroughly to remove the shampoo.  Then [employer] and genitals (private parts) with your normal soap and rinse thoroughly to remove  soap.  After that Use CHG Soap as you would any other liquid soap. You can apply CHG directly to the skin and wash gently with a scrungie or a clean washcloth.   Apply the CHG Soap to your body ONLY FROM THE NECK DOWN.  Do not use on open wounds or open sores. Avoid contact with your eyes, ears, mouth and genitals (private parts). Wash Face and genitals (private parts)  with your normal soap.   Wash thoroughly, paying special attention to the area where your surgery will be performed.  Thoroughly rinse your body with warm water from the neck down.  DO NOT shower/wash with your normal soap after using and rinsing off the CHG Soap.  Pat yourself dry with a CLEAN TOWEL.  Wear CLEAN PAJAMAS to bed the night before surgery  Place CLEAN SHEETS on your bed the night before your surgery  DO NOT SLEEP WITH PETS.   Day of Surgery:  Take a shower with CHG soap. Wear Clean/Comfortable clothing the morning of surgery Do not apply any deodorants/lotions.   Remember to brush your teeth WITH YOUR REGULAR TOOTHPASTE.   Please read over the following fact sheets that you were given.

## 2021-02-19 NOTE — Progress Notes (Addendum)
Anesthesia Chart Review:   Case: [id number] Date/Time: 02/20/21 1045   Procedure: RIGHT TOTAL MASTECTOMY (Right: Breast)   Anesthesia type: General   Pre-op diagnosis: RIGHT BREAST CANCER   Location: [facility] ROOM 02 / [facility]   Surgeons: Rolm Bookbinder, MD       DISCUSSION: Pt is 86 yeard old with hx CAD (s/p BMS to RCA 2001), HTN, COPD, OSA  VS: BP (!) 144/86   Pulse 81   Temp 36.6 C (Oral)   Resp 18   Ht 5' 2.5" (1.588 m)   Wt 67.6 kg   SpO2 100%   BMI 26.82 kg/m   PROVIDERS: - PCP is Lucianne Lei, MD  - Cardiologist is Carlyle Dolly, MD. Last office visit 11/14/20. Pt has cardiac clearance for surgery at acceptable risk from Kathyrn Drown, NP on 01/24/21  - Pulmonologist is Chesley Mires, MD. Last office visit 02/12/21 note documents "if chest xray is unremarkable and she is symptomatically improved with antibiotic, then she should be able to proceed with surgery"   LABS: Labs reviewed: Acceptable for surgery. (all labs ordered are listed, but only abnormal results are displayed)  Labs Reviewed  GLUCOSE, CAPILLARY - Abnormal; Notable for the following components:      Result Value   Glucose-Capillary 110 (*)    All other components within normal limits  HEMOGLOBIN A1C - Abnormal; Notable for the following components:   Hgb A1c MFr Bld 5.7 (*)    All other components within normal limits  SARS CORONAVIRUS 2 (TAT 6-24 HRS)     IMAGES: CXR 02/12/21: No active cardiopulmonary disease.   EKG 10/03/20: NSR. Nonspecific ST abnormality   CV: Nuclear stress test 03/30/20:  There was no ST segment deviation noted during stress. The study is normal. There are no perfusion defects consistent with prior infarct or current ischemia. This is a low risk study. The left ventricular ejection fraction is hyperdynamic (>65%).  Echo 03/14/16:  - Left ventricle: The cavity size was normal. Wall thickness was increased in a pattern of moderate LVH. Systolic function was normal. The  estimated ejection fraction was in the range of 55% to 60%. Wall motion was normal; there were no regional wall motion abnormalities. Doppler parameters are consistent with abnormal left ventricular relaxation (grade 1 diastolic dysfunction).     Past Medical History:  Diagnosis Date   Arteriosclerotic cardiovascular disease (ASCVD) 2001   Bare-metal stent placed in the right coronary artery in 12/01; residual 50% lesion of the first diagonal and mid LAD   Breast cancer ([place])    Cerebrovascular disease    COPD (chronic obstructive pulmonary disease) (HCC)    Cyst, dermoid, arm, left 03/26/2018   Diabetes mellitus    excellent control with a low-dose of a single oral agent   Family history of breast cancer 01/23/2021   Family history of ovarian cancer 01/23/2021   GERD (gastroesophageal reflux disease)    with ulcers   History of right coronary artery stent placement 2000   Hyperlipidemia    Hypertension    Sleep apnea    Thalassemia minor    Tobacco abuse, in remission    35 pack years; Quit in 1980    Past Surgical History:  Procedure Laterality Date   BIOPSY  02/28/2020   Procedure: BIOPSY;  Surgeon: Eloise Harman, DO;  [facility];  Service: Endoscopy;;   COLONOSCOPY  2006   Dr. Collene Mares: normal   COLONOSCOPY  2000   Dr. Everlean Cherry: normal  COLONOSCOPY N/A 03/03/2016   Dr. Oneida Alar: diverticulosis, non-bleeding hemorrhoids, redundant left colon.   DILATION AND CURETTAGE OF UTERUS     ESOPHAGOGASTRODUODENOSCOPY  2000   Dr. Everlean Cherry: gastritis    ESOPHAGOGASTRODUODENOSCOPY  2006   Dr. Collene Mares: small hiatal hernia, gastritis, negative H.pylori    ESOPHAGOGASTRODUODENOSCOPY N/A 03/03/2016   Procedure: ESOPHAGOGASTRODUODENOSCOPY (EGD);  Surgeon: Danie Binder, MD;  [facility];  Service: Endoscopy;  Laterality: N/A;   ESOPHAGOGASTRODUODENOSCOPY N/A 06/03/2016   Dr. Oneida Alar: nonaggressive gastritis due to aspirin use. Previous ulcers had healed.    ESOPHAGOGASTRODUODENOSCOPY (EGD) WITH PROPOFOL N/A 02/28/2020   focal inflammation in the gastric antrum consistent with gastritis on biopsies. No H.pylori.    EXCISION OF KELOID Left 03/26/2018   Procedure: EXCISION OF LEFT ARM MASS;  Surgeon: Fanny Skates, MD;  Location: [facility];  Service: General;  Laterality: Left;   GIVENS CAPSULE STUDY N/A 10/10/2020   Procedure: GIVENS CAPSULE STUDY;  Surgeon: Eloise Harman, DO;  [facility];  Service: Endoscopy;  Laterality: N/A;  7:30am   INGUINAL HERNIA REPAIR  1970s   Right   VAGINAL HYSTERECTOMY  1980   Unilateral oophorectomy    MEDICATIONS:  acetaminophen (TYLENOL) 500 MG tablet   albuterol (VENTOLIN HFA) 108 (90 Base) MCG/ACT inhaler   amLODipine (NORVASC) 10 MG tablet   aspirin EC 81 MG tablet   benzonatate (TESSALON) 100 MG capsule   carvedilol (COREG) 6.25 MG tablet   cetirizine (ZYRTEC) 5 MG tablet   ergocalciferol (VITAMIN D2) 1.25 MG (50000 UT) capsule   irbesartan-hydrochlorothiazide (AVALIDE) 300-12.5 MG tablet   iron polysaccharides (NIFEREX) 150 MG capsule   lansoprazole (PREVACID) 30 MG capsule   Latanoprostene Bunod 0.024 % SOLN   metFORMIN (GLUCOPHAGE) 1000 MG tablet   nitroGLYCERIN (NITROSTAT) 0.4 MG SL tablet   rosuvastatin (CRESTOR) 20 MG tablet   sennosides-docusate sodium (SENOKOT-S) 8.6-50 MG tablet   triamcinolone ointment (KENALOG) 0.5 %   Turmeric 500 MG CAPS   No current facility-administered medications for this encounter.    If no changes, I anticipate pt can proceed with surgery as scheduled.   Willeen Cass, PhD, FNP-BC [facility]/Anesthesiology Phone: [phone] 02/19/2021 9:28 AM

## 2021-02-19 NOTE — Anesthesia Preprocedure Evaluation (Addendum)
Anesthesia Evaluation  Patient identified by MRN, date of birth, ID band Patient awake    Reviewed: Allergy & Precautions, H&P , NPO status , Patient's Chart, lab work & pertinent test results  History of Anesthesia Complications Negative for: history of anesthetic complications  Airway Mallampati: II   Neck ROM: full    Dental   Pulmonary sleep apnea , COPD,  COPD inhaler, former smoker,   CXR 02/12/21 - no active cardiopulmonary disease    breath sounds clear to auscultation       Cardiovascular hypertension, Pt. on medications and Pt. on home beta blockers + CAD and + Cardiac Stents   Rhythm:regular Rate:Normal   '21 Myoperfusion - There was no ST segment deviation noted during stress. The study is normal. There are no perfusion defects consistent with prior infarct or current ischemia. This is a low risk study. The left ventricular ejection fraction is hyperdynamic (>65%).      Neuro/Psych negative neurological ROS  negative psych ROS   GI/Hepatic Neg liver ROS, GERD  Medicated,  Endo/Other  diabetes, Type 2, Oral Hypoglycemic Agents  Renal/GU negative Renal ROS     Musculoskeletal negative musculoskeletal ROS (+)   Abdominal   Peds  Hematology negative hematology ROS (+)   Anesthesia Other Findings Covid test negative   Reproductive/Obstetrics  Breast cancer                            Anesthesia Physical Anesthesia Plan  ASA: 3  Anesthesia Plan: General   Post-op Pain Management:  Regional for Post-op pain   Induction: Intravenous  PONV Risk Score and Plan: 4 or greater and Treatment may vary due to age or medical condition, Ondansetron, Dexamethasone and Propofol infusion  Airway Management Planned: LMA  Additional Equipment: None  Intra-op Plan:   Post-operative Plan: Extubation in OR  Informed Consent: I have reviewed the patients History and Physical, chart,  labs and discussed the procedure including the risks, benefits and alternatives for the proposed anesthesia with the patient or authorized representative who has indicated his/her understanding and acceptance.     Dental advisory given  Plan Discussed with: CRNA, Anesthesiologist and Surgeon  Anesthesia Plan Comments:       Anesthesia Quick Evaluation

## 2021-02-20 ENCOUNTER — Encounter (HOSPITAL_COMMUNITY): Payer: Self-pay | Admitting: General Surgery

## 2021-02-20 ENCOUNTER — Encounter (HOSPITAL_COMMUNITY)
Admission: RE | Disposition: A | Discharge status: Home / Self Care | Payer: Self-pay | Source: Home / Self Care | Attending: General Surgery

## 2021-02-20 ENCOUNTER — Other Ambulatory Visit: Payer: Self-pay

## 2021-02-20 ENCOUNTER — Ambulatory Visit (HOSPITAL_COMMUNITY): Payer: PPO | Admitting: Emergency Medicine

## 2021-02-20 ENCOUNTER — Ambulatory Visit (HOSPITAL_COMMUNITY): Payer: PPO | Admitting: Certified Registered Nurse Anesthetist

## 2021-02-20 ENCOUNTER — Observation Stay (HOSPITAL_COMMUNITY)
Admission: RE | Admit: 2021-02-20 | Discharge: 2021-02-21 | Disposition: A | Discharge status: Home / Self Care | Payer: PPO | Attending: General Surgery | Admitting: General Surgery

## 2021-02-20 DIAGNOSIS — E119 Type 2 diabetes mellitus without complications: Secondary | ICD-10-CM | POA: Insufficient documentation

## 2021-02-20 DIAGNOSIS — D0511 Intraductal carcinoma in situ of right breast: Secondary | ICD-10-CM | POA: Diagnosis not present

## 2021-02-20 DIAGNOSIS — I2510 Atherosclerotic heart disease of native coronary artery without angina pectoris: Secondary | ICD-10-CM | POA: Diagnosis not present

## 2021-02-20 DIAGNOSIS — N6091 Unspecified benign mammary dysplasia of right breast: Secondary | ICD-10-CM | POA: Diagnosis not present

## 2021-02-20 DIAGNOSIS — Z7984 Long term (current) use of oral hypoglycemic drugs: Secondary | ICD-10-CM | POA: Insufficient documentation

## 2021-02-20 DIAGNOSIS — Z79899 Other long term (current) drug therapy: Secondary | ICD-10-CM | POA: Insufficient documentation

## 2021-02-20 DIAGNOSIS — I10 Essential (primary) hypertension: Secondary | ICD-10-CM | POA: Insufficient documentation

## 2021-02-20 DIAGNOSIS — Z9011 Acquired absence of right breast and nipple: Secondary | ICD-10-CM

## 2021-02-20 DIAGNOSIS — Z23 Encounter for immunization: Secondary | ICD-10-CM | POA: Insufficient documentation

## 2021-02-20 DIAGNOSIS — Z87891 Personal history of nicotine dependence: Secondary | ICD-10-CM | POA: Insufficient documentation

## 2021-02-20 DIAGNOSIS — Z803 Family history of malignant neoplasm of breast: Secondary | ICD-10-CM | POA: Insufficient documentation

## 2021-02-20 DIAGNOSIS — R921 Mammographic calcification found on diagnostic imaging of breast: Secondary | ICD-10-CM | POA: Diagnosis not present

## 2021-02-20 DIAGNOSIS — J449 Chronic obstructive pulmonary disease, unspecified: Secondary | ICD-10-CM | POA: Diagnosis not present

## 2021-02-20 DIAGNOSIS — G8918 Other acute postprocedural pain: Secondary | ICD-10-CM | POA: Diagnosis not present

## 2021-02-20 HISTORY — PX: TOTAL MASTECTOMY: SHX6129

## 2021-02-20 LAB — GLUCOSE, CAPILLARY
Glucose-Capillary: 109 mg/dL — ABNORMAL HIGH (ref 70–99)
Glucose-Capillary: 100 mg/dL — ABNORMAL HIGH (ref 70–99)

## 2021-02-20 SURGERY — MASTECTOMY, SIMPLE
Anesthesia: General | Site: Breast | Laterality: Right

## 2021-02-20 MED ORDER — ONDANSETRON HCL 4 MG/2ML IJ SOLN: 4.0000 mg | Freq: Four times a day (QID) | INTRAMUSCULAR | Status: DC | PRN

## 2021-02-20 MED ORDER — BUPIVACAINE-EPINEPHRINE (PF) 0.25% -1:200000 IJ SOLN
INTRAMUSCULAR | Status: AC
  Filled 2021-02-20: qty 30

## 2021-02-20 MED ORDER — ONDANSETRON HCL 4 MG/2ML IJ SOLN: 4.0000 mg | Freq: Once | INTRAMUSCULAR | Status: DC | PRN

## 2021-02-20 MED ORDER — NITROGLYCERIN 0.4 MG SL SUBL: 0.4000 mg | SUBLINGUAL_TABLET | SUBLINGUAL | Status: DC | PRN

## 2021-02-20 MED ORDER — DEXAMETHASONE SODIUM PHOSPHATE 10 MG/ML IJ SOLN
INTRAMUSCULAR | Status: DC | PRN
  Administered 2021-02-20: 4 mg via INTRAVENOUS

## 2021-02-20 MED ORDER — SIMETHICONE 80 MG PO CHEW: 40.0000 mg | CHEWABLE_TABLET | Freq: Four times a day (QID) | ORAL | Status: DC | PRN

## 2021-02-20 MED ORDER — MORPHINE SULFATE (PF) 2 MG/ML IV SOLN: 1.0000 mg | INTRAVENOUS | Status: DC | PRN

## 2021-02-20 MED ORDER — LIDOCAINE HCL (CARDIAC) PF 100 MG/5ML IV SOSY
PREFILLED_SYRINGE | INTRAVENOUS | Status: DC | PRN
  Administered 2021-02-20: 60 mg via INTRAVENOUS

## 2021-02-20 MED ORDER — HEMOSTATIC AGENTS (NO CHARGE) OPTIME
TOPICAL | Status: DC | PRN
  Administered 2021-02-20: 2 via TOPICAL

## 2021-02-20 MED ORDER — MIDAZOLAM HCL 2 MG/2ML IJ SOLN: 1.0000 mg | Freq: Once | INTRAMUSCULAR | Status: AC

## 2021-02-20 MED ORDER — ONDANSETRON 4 MG PO TBDP: 4.0000 mg | ORAL_TABLET | Freq: Four times a day (QID) | ORAL | Status: DC | PRN

## 2021-02-20 MED ORDER — INFLUENZA VAC A&B SA ADJ QUAD 0.5 ML IM PRSY
0.5000 mL | PREFILLED_SYRINGE | INTRAMUSCULAR | Status: AC
  Administered 2021-02-21: 0.5 mL via INTRAMUSCULAR
  Filled 2021-02-20: qty 0.5

## 2021-02-20 MED ORDER — CHLORHEXIDINE GLUCONATE CLOTH 2 % EX PADS: 6.0000 | MEDICATED_PAD | Freq: Once | CUTANEOUS | Status: DC

## 2021-02-20 MED ORDER — ACETAMINOPHEN 500 MG PO TABS
1000.0000 mg | ORAL_TABLET | ORAL | Status: AC
  Administered 2021-02-20: 1000 mg via ORAL
  Filled 2021-02-20: qty 2

## 2021-02-20 MED ORDER — SENNOSIDES-DOCUSATE SODIUM 8.6-50 MG PO TABS: 1.0000 | ORAL_TABLET | Freq: Every day | ORAL | Status: DC | PRN

## 2021-02-20 MED ORDER — OXYCODONE HCL 5 MG/5ML PO SOLN: 5.0000 mg | Freq: Once | ORAL | Status: DC | PRN

## 2021-02-20 MED ORDER — CARVEDILOL 6.25 MG PO TABS
6.2500 mg | ORAL_TABLET | Freq: Two times a day (BID) | ORAL | Status: DC
  Administered 2021-02-20 – 2021-02-21 (×2): 6.25 mg via ORAL
  Filled 2021-02-20 (×2): qty 1

## 2021-02-20 MED ORDER — ORAL CARE MOUTH RINSE: 15.0000 mL | Freq: Once | OROMUCOSAL | Status: AC

## 2021-02-20 MED ORDER — ONDANSETRON HCL 4 MG/2ML IJ SOLN
INTRAMUSCULAR | Status: DC | PRN
  Administered 2021-02-20: 4 mg via INTRAVENOUS

## 2021-02-20 MED ORDER — CHLORHEXIDINE GLUCONATE 0.12 % MT SOLN
15.0000 mL | Freq: Once | OROMUCOSAL | Status: AC
  Administered 2021-02-20: 15 mL via OROMUCOSAL
  Filled 2021-02-20: qty 15

## 2021-02-20 MED ORDER — ENSURE PRE-SURGERY PO LIQD: 296.0000 mL | Freq: Once | ORAL | Status: DC

## 2021-02-20 MED ORDER — SENNA-DOCUSATE SODIUM 8.6-50 MG PO TABS: 2.0000 | ORAL_TABLET | Freq: Every day | ORAL | Status: DC | PRN

## 2021-02-20 MED ORDER — FENTANYL CITRATE (PF) 100 MCG/2ML IJ SOLN: 50.0000 ug | Freq: Once | INTRAMUSCULAR | Status: AC

## 2021-02-20 MED ORDER — PHENYLEPHRINE HCL-NACL 20-0.9 MG/250ML-% IV SOLN
INTRAVENOUS | Status: DC | PRN
  Administered 2021-02-20: 25 ug/min via INTRAVENOUS

## 2021-02-20 MED ORDER — ALBUTEROL SULFATE HFA 108 (90 BASE) MCG/ACT IN AERS: 2.0000 | INHALATION_SPRAY | Freq: Four times a day (QID) | RESPIRATORY_TRACT | Status: DC | PRN

## 2021-02-20 MED ORDER — METFORMIN HCL 500 MG PO TABS
500.0000 mg | ORAL_TABLET | Freq: Every day | ORAL | Status: DC
  Administered 2021-02-20: 500 mg via ORAL
  Filled 2021-02-20: qty 1

## 2021-02-20 MED ORDER — 0.9 % SODIUM CHLORIDE (POUR BTL) OPTIME
TOPICAL | Status: DC | PRN
  Administered 2021-02-20: 1000 mL

## 2021-02-20 MED ORDER — IRBESARTAN 300 MG PO TABS
300.0000 mg | ORAL_TABLET | Freq: Every day | ORAL | Status: DC
  Administered 2021-02-20 – 2021-02-21 (×2): 300 mg via ORAL
  Filled 2021-02-20 (×2): qty 1

## 2021-02-20 MED ORDER — OXYCODONE HCL 5 MG PO TABS: 5.0000 mg | ORAL_TABLET | Freq: Once | ORAL | Status: DC | PRN

## 2021-02-20 MED ORDER — PANTOPRAZOLE SODIUM 40 MG PO TBEC
40.0000 mg | DELAYED_RELEASE_TABLET | Freq: Every day | ORAL | Status: DC
  Administered 2021-02-20 – 2021-02-21 (×2): 40 mg via ORAL
  Filled 2021-02-20 (×2): qty 1

## 2021-02-20 MED ORDER — AMLODIPINE BESYLATE 10 MG PO TABS
10.0000 mg | ORAL_TABLET | Freq: Every day | ORAL | Status: DC
  Administered 2021-02-20: 10 mg via ORAL
  Filled 2021-02-20 (×2): qty 1

## 2021-02-20 MED ORDER — ALBUTEROL SULFATE (2.5 MG/3ML) 0.083% IN NEBU: 2.5000 mg | INHALATION_SOLUTION | Freq: Four times a day (QID) | RESPIRATORY_TRACT | Status: DC | PRN

## 2021-02-20 MED ORDER — FENTANYL CITRATE (PF) 250 MCG/5ML IJ SOLN
INTRAMUSCULAR | Status: AC
  Filled 2021-02-20: qty 5

## 2021-02-20 MED ORDER — BUPIVACAINE-EPINEPHRINE (PF) 0.5% -1:200000 IJ SOLN
INTRAMUSCULAR | Status: DC | PRN
  Administered 2021-02-20: 25 mL via PERINEURAL

## 2021-02-20 MED ORDER — FENTANYL CITRATE (PF) 100 MCG/2ML IJ SOLN
INTRAMUSCULAR | Status: AC
  Administered 2021-02-20: 50 ug via INTRAVENOUS
  Filled 2021-02-20: qty 2

## 2021-02-20 MED ORDER — LACTATED RINGERS IV SOLN: INTRAVENOUS | Status: DC

## 2021-02-20 MED ORDER — TRAMADOL HCL 50 MG PO TABS: 100.0000 mg | ORAL_TABLET | Freq: Four times a day (QID) | ORAL | 0 refills | Status: DC | PRN

## 2021-02-20 MED ORDER — FENTANYL CITRATE (PF) 100 MCG/2ML IJ SOLN
INTRAMUSCULAR | Status: AC
  Filled 2021-02-20: qty 2

## 2021-02-20 MED ORDER — MIDAZOLAM HCL 2 MG/2ML IJ SOLN
INTRAMUSCULAR | Status: AC
  Administered 2021-02-20: 1 mg via INTRAVENOUS
  Filled 2021-02-20: qty 2

## 2021-02-20 MED ORDER — OXYCODONE HCL 5 MG PO TABS
5.0000 mg | ORAL_TABLET | ORAL | Status: DC | PRN
  Administered 2021-02-20: 10 mg via ORAL
  Filled 2021-02-20: qty 2

## 2021-02-20 MED ORDER — PROPOFOL 10 MG/ML IV BOLUS
INTRAVENOUS | Status: DC | PRN
  Administered 2021-02-20: 50 mg via INTRAVENOUS
  Administered 2021-02-20: 150 mg via INTRAVENOUS

## 2021-02-20 MED ORDER — MAGTRACE LYMPHATIC TRACER
INTRAMUSCULAR | Status: DC | PRN
  Administered 2021-02-20: 3 mL via INTRAMUSCULAR

## 2021-02-20 MED ORDER — IRBESARTAN-HYDROCHLOROTHIAZIDE 300-12.5 MG PO TABS: 1.0000 | ORAL_TABLET | Freq: Every day | ORAL | Status: DC

## 2021-02-20 MED ORDER — CEFAZOLIN SODIUM-DEXTROSE 2-4 GM/100ML-% IV SOLN
2.0000 g | INTRAVENOUS | Status: AC
  Administered 2021-02-20: 2 g via INTRAVENOUS
  Filled 2021-02-20: qty 100

## 2021-02-20 MED ORDER — FENTANYL CITRATE (PF) 100 MCG/2ML IJ SOLN: 25.0000 ug | INTRAMUSCULAR | Status: DC | PRN

## 2021-02-20 MED ORDER — FENTANYL CITRATE (PF) 250 MCG/5ML IJ SOLN
INTRAMUSCULAR | Status: DC | PRN
  Administered 2021-02-20: 25 ug via INTRAVENOUS

## 2021-02-20 MED ORDER — ACETAMINOPHEN 500 MG PO TABS
1000.0000 mg | ORAL_TABLET | Freq: Four times a day (QID) | ORAL | Status: DC
  Administered 2021-02-20 – 2021-02-21 (×3): 1000 mg via ORAL
  Filled 2021-02-20 (×3): qty 2

## 2021-02-20 MED ORDER — BUPIVACAINE HCL (PF) 0.25 % IJ SOLN
INTRAMUSCULAR | Status: AC
  Filled 2021-02-20: qty 10

## 2021-02-20 MED ORDER — METHOCARBAMOL 500 MG PO TABS
500.0000 mg | ORAL_TABLET | Freq: Four times a day (QID) | ORAL | Status: DC | PRN
  Administered 2021-02-20: 500 mg via ORAL
  Filled 2021-02-20: qty 1

## 2021-02-20 MED ORDER — FENTANYL CITRATE (PF) 100 MCG/2ML IJ SOLN
25.0000 ug | INTRAMUSCULAR | Status: DC | PRN
  Administered 2021-02-20 (×3): 25 ug via INTRAVENOUS

## 2021-02-20 MED ORDER — HYDROCHLOROTHIAZIDE 12.5 MG PO CAPS
12.5000 mg | ORAL_CAPSULE | Freq: Every day | ORAL | Status: DC
  Administered 2021-02-20 – 2021-02-21 (×2): 12.5 mg via ORAL
  Filled 2021-02-20 (×2): qty 1

## 2021-02-20 MED ORDER — SODIUM CHLORIDE 0.9 % IV SOLN: INTRAVENOUS | Status: DC

## 2021-02-20 MED ORDER — PHENYLEPHRINE 40 MCG/ML (10ML) SYRINGE FOR IV PUSH (FOR BLOOD PRESSURE SUPPORT)
PREFILLED_SYRINGE | INTRAVENOUS | Status: DC | PRN
  Administered 2021-02-20 (×2): 80 ug via INTRAVENOUS

## 2021-02-20 SURGICAL SUPPLY — 43 items
ADH SKN CLS APL DERMABOND .7 (GAUZE/BANDAGES/DRESSINGS) ×1
APL PRP STRL LF DISP 70% ISPRP (MISCELLANEOUS) ×1
APPLIER CLIP 9.375 MED OPEN (MISCELLANEOUS) ×2
APR CLP MED 9.3 20 MLT OPN (MISCELLANEOUS) ×1
BAG COUNTER SPONGE SURGICOUNT (BAG) ×2 IMPLANT
BAG SPNG CNTER NS LX DISP (BAG) ×1
BINDER BREAST LRG (GAUZE/BANDAGES/DRESSINGS) IMPLANT
BINDER BREAST XLRG (GAUZE/BANDAGES/DRESSINGS) ×1 IMPLANT
BIOPATCH RED 1 DISK 7.0 (GAUZE/BANDAGES/DRESSINGS) ×2 IMPLANT
CHLORAPREP W/TINT 26 (MISCELLANEOUS) ×2 IMPLANT
CLIP APPLIE 9.375 MED OPEN (MISCELLANEOUS) IMPLANT
COVER SURGICAL LIGHT HANDLE (MISCELLANEOUS) ×2 IMPLANT
DERMABOND ADVANCED (GAUZE/BANDAGES/DRESSINGS) ×1
DERMABOND ADVANCED .7 DNX12 (GAUZE/BANDAGES/DRESSINGS) ×1 IMPLANT
DRAIN CHANNEL 19F RND (DRAIN) ×2 IMPLANT
DRAPE CHEST BREAST 15X10 FENES (DRAPES) ×2 IMPLANT
DRSG PAD ABDOMINAL 8X10 ST (GAUZE/BANDAGES/DRESSINGS) ×4 IMPLANT
DRSG TEGADERM 4X4.75 (GAUZE/BANDAGES/DRESSINGS) ×2 IMPLANT
ELECT BLADE 4.0 EZ CLEAN MEGAD (MISCELLANEOUS) ×2
ELECT CAUTERY BLADE 6.4 (BLADE) ×2 IMPLANT
ELECT REM PT RETURN 9FT ADLT (ELECTROSURGICAL) ×2
ELECTRODE BLDE 4.0 EZ CLN MEGD (MISCELLANEOUS) ×1 IMPLANT
ELECTRODE REM PT RTRN 9FT ADLT (ELECTROSURGICAL) ×1 IMPLANT
EVACUATOR SILICONE 100CC (DRAIN) ×2 IMPLANT
GLOVE SURG ENC MOIS LTX SZ7 (GLOVE) ×2 IMPLANT
GLOVE SURG UNDER POLY LF SZ7.5 (GLOVE) ×2 IMPLANT
GOWN STRL REUS W/ TWL LRG LVL3 (GOWN DISPOSABLE) ×3 IMPLANT
GOWN STRL REUS W/TWL LRG LVL3 (GOWN DISPOSABLE) ×6
KIT BASIN OR (CUSTOM PROCEDURE TRAY) ×2 IMPLANT
KIT TURNOVER KIT B (KITS) ×2 IMPLANT
NS IRRIG 1000ML POUR BTL (IV SOLUTION) ×2 IMPLANT
PACK GENERAL/GYN (CUSTOM PROCEDURE TRAY) ×2 IMPLANT
PAD ARMBOARD 7.5X6 YLW CONV (MISCELLANEOUS) ×2 IMPLANT
PENCIL SMOKE EVACUATOR (MISCELLANEOUS) ×1 IMPLANT
STRIP CLOSURE SKIN 1/2X4 (GAUZE/BANDAGES/DRESSINGS) ×2 IMPLANT
SUT ETHILON 2 0 FS 18 (SUTURE) ×2 IMPLANT
SUT ETHILON 3 0 PS 1 (SUTURE) ×1 IMPLANT
SUT MNCRL AB 4-0 PS2 18 (SUTURE) ×2 IMPLANT
SUT VIC AB 2-0 SH 18 (SUTURE) ×2 IMPLANT
SUT VIC AB 3-0 SH 18 (SUTURE) ×3 IMPLANT
TOWEL GREEN STERILE (TOWEL DISPOSABLE) ×2 IMPLANT
TOWEL GREEN STERILE FF (TOWEL DISPOSABLE) ×2 IMPLANT
TRACER MAGTRACE VIAL (MISCELLANEOUS) ×1 IMPLANT

## 2021-02-20 NOTE — Transfer of Care (Signed)
Immediate Anesthesia Transfer of Care Note  Patient: Nancy Liu  Procedure(s) Performed: RIGHT TOTAL MASTECTOMY (Right: Breast)  Patient Location: PACU  Anesthesia Type:GA combined with regional for post-op pain  Level of Consciousness: drowsy  Airway & Oxygen Therapy: Patient Spontanous Breathing and Patient connected to face mask oxygen  Post-op Assessment: Report given to RN and Post -op Vital signs reviewed and stable  Post vital signs: Reviewed and stable  Last Vitals:  Vitals Value Taken Time  BP 182/92 02/20/21 1159  Temp    Pulse 71 02/20/21 1159  Resp 13 02/20/21 1159  SpO2 100 % 02/20/21 1159  Vitals shown include unvalidated device data.  Last Pain:  Vitals:   02/20/21 0909  TempSrc: Oral         Complications: No notable events documented.

## 2021-02-20 NOTE — Anesthesia Procedure Notes (Signed)
Procedure Name: LMA Insertion Date/Time: 02/20/2021 10:27 AM Performed by: Carolan Clines, CRNA Pre-anesthesia Checklist: Patient identified, Emergency Drugs available, Suction available and Patient being monitored Patient Re-evaluated:Patient Re-evaluated prior to induction Oxygen Delivery Method: Circle System Utilized Preoxygenation: Pre-oxygenation with 100% oxygen Induction Type: IV induction Ventilation: Mask ventilation without difficulty LMA: LMA inserted LMA Size: 4.0 Number of attempts: 1 Placement Confirmation: positive ETCO2 Tube secured with: Tape Dental Injury: Teeth and Oropharynx as per pre-operative assessment

## 2021-02-20 NOTE — H&P (Signed)
17 yof with history cad/stent who underwent screening mammogram. She has no prior breast history. Has no mass or dc. She was noted to have right sided area of masses and calcifications measuring 8.9cm with multiple masses measuring 1-4 mm in that area. Biopsy of the anterior and posterior extent shows dcis Int grade that is er/pr positive. She is here today to discuss options. She is with her daughter.   Review of Systems: A complete review of systems was obtained from the patient. I have reviewed this information and discussed as appropriate with the patient. See HPI as well for other ROS.  Review of Systems  All other systems reviewed and are negative.   Medical History: Past Medical History:  Diagnosis Date   Anxiety   Arthritis   COPD (chronic obstructive pulmonary disease) (CMS-HCC)   Diabetes mellitus without complication (CMS-HCC)   GERD (gastroesophageal reflux disease)   Glaucoma (increased eye pressure)   Heart valve disease   Hyperlipidemia   Hypertension   Patient Active Problem List  Diagnosis   Ductal carcinoma in situ (DCIS) of right breast   Essential hypertension   Non-insulin dependent type 2 diabetes mellitus (CMS-HCC)   CAD S/P percutaneous coronary angioplasty   Cerebrovascular disease   Past Surgical History:  Procedure Laterality Date   COLONOSCOPY N/A  2006, 2000   dilation and curettage of uterus N/A  Date Unknown   esophagogastroduodenoscopy N/A  06/03/16, 03/03/2016, 2006, 2000   esophagogastroduodenoscopy with propofol N/A  02/28/2020   excision of keloid N/A  03/26/2018   Given capsule study N/A  10/10/2020   HYSTERECTOMY VAGINAL N/A  1980   INGUINAL HERNIA REPAIR N/A  1970s    Allergies  Allergen Reactions   Pantoprazole Sodium Other (See Comments)  DRY LIPS   Shellfish Containing Products Unknown  Unknown reaction   Current Outpatient Medications on File Prior to Visit  Medication Sig Dispense Refill   amLODIPine (NORVASC) 10 MG  tablet Take 1 tablet by mouth once daily   carvediloL (COREG) 6.25 MG tablet TAKE ONE TABLET BY MOUTH BEFORE BREAKFAST and TAKE ONE TABLET BY MOUTH EVERYDAY AT BEDTIME   ergocalciferol, vitamin D2, 1,250 mcg (50,000 unit) capsule Take 50,000 Units by mouth every 7 (seven) days   irbesartan-hydrochlorothiazide (AVALIDE) 300-12.5 mg tablet Take 1 tablet by mouth once daily   lansoprazole (PREVACID) 30 MG DR capsule Take by mouth   metFORMIN (GLUCOPHAGE) 1000 MG tablet Take by mouth   rosuvastatin (CRESTOR) 20 MG tablet TAKE ONE TABLET BY MOUTH EVERYDAY AT BEDTIME   aspirin 81 MG EC tablet Take by mouth   ferrous sulfate 325 (65 FE) MG tablet Take by mouth   No current facility-administered medications on file prior to visit.   Family History  Problem Relation Age of Onset   Diabetes Mother   Heart disease Mother   Cancer Mother   Breast cancer Sister    Social History   Tobacco Use  Smoking Status Former Smoker   Quit date: 1982   Years since quitting: 40.6  Smokeless Tobacco Never Used    Social History   Socioeconomic History   Marital status: Divorced  Tobacco Use   Smoking status: Former Smoker  Quit date: 1982  Years since quitting: 40.6   Smokeless tobacco: Never Used  Scientific laboratory technician Use: Never used  Substance and Sexual Activity   Alcohol use: Never   Drug use: Never   Objective:  Physical Exam Constitutional:  Appearance: Normal appearance.  Cardiovascular:  Rate and Rhythm: Normal rate.  Pulmonary:  Effort: Pulmonary effort is normal.  Chest:  Breasts:  Right: No mass or nipple discharge.  Left: No mass or nipple discharge.   Lymphadenopathy:  Upper Body:  Right upper body: No supraclavicular or axillary adenopathy.  Left upper body: No supraclavicular or axillary adenopathy.  Neurological:  Mental Status: She is alert.    Assessment and Plan:   Ductal carcinoma in situ (DCIS) of right breast Right mastectomy  We discussed the staging  and pathophysiology of breast cancer. We discussed all of the different options for treatment for breast cancer including surgery, chemotherapy, radiation therapy, Herceptin, and antiestrogen therapy. I will not plan for sentinel node biopsy with mastectomy but will inject magtrace just in case something shows up on pathology that a sentinel node would be desired- at 30 with clinically negative axilla I doubt this would be necessary We discussed the options for treatment of the breast cancer which included lumpectomy versus a mastectomy. We discussed the performance of the lumpectomy with radioactive seed placement. Due to size I told her I did not think she is candidate for lumpectomy. We discussed mastectomy and the postoperative care for that as well. Mastectomy can be followed by reconstruction.she does not desire reconstruction.  I will obtain clearance from cardiology prior to surgery then schedule.  Risks discussed including bleeding, infection, possible reoperation. She understands her further therapy will be based on what her stages at the time of her operation.

## 2021-02-20 NOTE — Anesthesia Procedure Notes (Signed)
Anesthesia Regional Block: Pectoralis block   Pre-Anesthetic Checklist: , timeout performed,  Correct Patient, Correct Site, Correct Laterality,  Correct Procedure, Correct Position, site marked,  Risks and benefits discussed,  Surgical consent,  Pre-op evaluation,  At surgeon's request and post-op pain management  Laterality: Right  Prep: chloraprep       Needles:  Injection technique: Single-shot  Needle Type: Echogenic Needle     Needle Length: 9cm  Needle Gauge: 21     Additional Needles:   Narrative:  Start time: 02/20/2021 9:45 AM End time: 02/20/2021 9:54 AM Injection made incrementally with aspirations every 5 mL.  Performed by: Personally  Anesthesiologist: Albertha Ghee, MD  Additional Notes: Pt tolerated the procedure well.

## 2021-02-20 NOTE — Op Note (Signed)
Preoperative diagnosis: Clinical stage 0 right breast cancer Postoperative diagnosis: Same as above Procedure: 1.  Right mastectomy 2.  Injection of magtrace for delayed sentinel lymph node identification Surgeon: Dr. Serita Grammes Assistant:Puja Lemar Lofty, PA-C Specimens: Right breast tissue marked short superior, long lateral Estimated blood loss: 30 cc Drains: 19 [place] Blake drain Complications: None Special count was correct completion Disposition to recovery stable condition  Indications: This is a 80 year old female with multiple medical problems who underwent a screening mammogram.  She was noted to have a area on the right side of some small mass as well as calcifications measuring 8.9 cm.  Biopsy of the anterior and posterior extent of this shows intermediate grade DCIS that is estrogen and progesterone receptor positive.  We discussed all of her options and elected to proceed with a mastectomy as well as injection of mag trace in case a sentinel node was needed at a later date.  Procedure: After informed consent was obtained the patient first underwent a pectoral block.  She was given antibiotics.  SCDs were in place.  I did a timeout and then I cleansed the area around the areola.  I injected 3 cc of mag trace in the subareolar position.  She was then taken to the operating room.  She was placed under general anesthesia without complication.  She was prepped and draped in the standard sterile surgical fashion.  Surgical timeout was then performed.  I elected to make a reduction pattern incision.  I then made incision and created flaps to the clavicle, parasternal area, latissimus, and inframammary fold.  I then remove the breast tissue as well as the pectoralis fashion and passed this off the table as a specimen after marking it.  I then obtained hemostasis.  I then closed the dermis with multiple interrupted 3-0 Vicryl sutures.  I closed the T-junction with a 3-0 Vicryl suture to pull  this altogether as well.  I then placed a 36 [place] Blake drain I secured this with a 3-0 nylon.  I placed a Tegaderm and a Biopatch over this.  I then closed the skin with 4-0 Monocryl.  Glue Steri-Strips were applied.  She tolerated this well was extubated and transferred to recovery stable. I did listen with the's probe and was able to identify a sentinel node with the mag trace at completion in case I need to go ahead with this at a later date.

## 2021-02-20 NOTE — Discharge Instructions (Signed)
[facility], [place] [phone]  MASTECTOMY: POST OP INSTRUCTIONS Take 400 mg of ibuprofen every 8 hours or 650 mg tylenol every 6 hours for next 72 hours then as needed. Use ice several times daily also. Always review your discharge instruction sheet given to you by the facility where your surgery was performed. IF YOU HAVE DISABILITY OR FAMILY LEAVE FORMS, YOU MUST BRING THEM TO THE OFFICE FOR PROCESSING.   DO NOT GIVE THEM TO YOUR DOCTOR. A prescription for pain medication may be given to you upon discharge.  Take your pain medication as prescribed, if needed.  If narcotic pain medicine is not needed, then you may take acetaminophen (Tylenol), naprosyn (Alleve) or ibuprofen (Advil) as needed. Take your usually prescribed medications unless otherwise directed. If you need a refill on your pain medication, please contact your pharmacy.  They will contact our office to request authorization.  Prescriptions will not be filled after 5pm or on week-ends. You should follow a light diet the first few days after arrival home, such as soup and crackers, etc.  Resume your normal diet the day after surgery. Most patients will experience some swelling and bruising on the chest and underarm.  Ice packs will help.  Swelling and bruising can take several days to resolve. Wear the binder day and night until you return to the office.  It is common to experience some constipation if taking pain medication after surgery.  Increasing fluid intake and taking a stool softener (such as Colace) will usually help or prevent this problem from occurring.  A mild laxative (Milk of Magnesia or Miralax) should be taken according to package instructions if there are no bowel movements after 48 hours. Unless discharge instructions indicate otherwise, leave your bandage dry and in place until your next appointment in 3-5 days.  You may take a limited sponge bath.  No tube baths or showers until the drains are removed.   You may have steri-strips (small skin tapes) in place directly over the incision.  These strips should be left on the skin for 7-10 days. If you have glue it will come off in next couple week.  Any sutures will be removed at an office visit DRAINS:  If you have drains in place, it is important to keep a list of the amount of drainage produced each day in your drains.  Before leaving the hospital, you should be instructed on drain care.  Call our office if you have any questions about your drains. I will remove your drains when they put out less than 30 cc or ml for 2 consecutive days. ACTIVITIES:  You may resume regular (light) daily activities beginning the next day--such as daily self-care, walking, climbing stairs--gradually increasing activities as tolerated.  You may have sexual intercourse when it is comfortable.  Refrain from any heavy lifting or straining until approved by your doctor. You may drive when you are no longer taking prescription pain medication, you can comfortably wear a seatbelt, and you can safely maneuver your car and apply brakes. RETURN TO WORK:  __________________________________________________________ Dennis Bast should see your doctor in the office for a follow-up appointment approximately 3-5 days after your surgery.  Your doctor's nurse will typically make your follow-up appointment when she calls you with your pathology report.  Expect your pathology report 3-4business days after surgery. OTHER INSTRUCTIONS: ______________________________________________________________________________________________ ____________________________________________________________________________________________ WHEN TO CALL YOUR DR Ky Rumple: Fever over 101.0 Nausea and/or vomiting Extreme swelling or bruising Continued bleeding from incision. Increased pain, redness,  or drainage from the incision. The clinic staff is available to answer your questions during regular business hours.  Please don't  hesitate to call and ask to speak to one of the nurses for clinical concerns.  If you have a medical emergency, go to the nearest emergency room or call 911.  A surgeon from [facility] is always on call at the hospital. [address] [place], [place], [place]  [address] ? [address], [place], [place]   [phone] ? [phone] ? FAX [phone] Web site: [email or web address]

## 2021-02-21 ENCOUNTER — Encounter (HOSPITAL_COMMUNITY): Payer: Self-pay | Admitting: General Surgery

## 2021-02-21 DIAGNOSIS — D0511 Intraductal carcinoma in situ of right breast: Secondary | ICD-10-CM | POA: Diagnosis not present

## 2021-02-21 NOTE — Discharge Summary (Signed)
Nancy Liu Discharge Summary   Patient ID: [name] MRN: [id number] DOB/AGE: 1940-08-23 80 y.o.  Admit date: 02/20/2021 Discharge date: 02/21/2021  Admitting Diagnosis: Clinical stage 0 right breast cancer  Discharge Diagnosis Patient Active Problem List   Diagnosis Date Noted   S/P mastectomy, right 02/20/2021   Genetic testing 02/12/2021   Family history of breast cancer 01/23/2021   Family history of ovarian cancer 01/23/2021   Ductal carcinoma in situ (DCIS) of right breast 01/17/2021   Black stool 08/24/2020   Non-insulin dependent type 2 diabetes mellitus ([place]) 03/26/2020   Loss of weight 07/13/2019   Cyst, dermoid, arm, left 03/26/2018   Diarrhea 08/10/2017   Constipation 02/10/2017   AVM (arteriovenous malformation) of small bowel, acquired 02/10/2017   Iron deficiency anemia due to chronic blood loss 07/23/2016   Gastritis and gastroduodenitis    Symptomatic anemia 02/29/2016   Weight loss 04/01/2012   Chest pain on exertion 02/07/2011   Microcytic anemia 01/31/2010   Cerebrovascular disease 01/31/2010   CAD S/P percutaneous coronary angioplasty 01/03/2009   Dyslipidemia, goal LDL below 70 10/22/2007   Essential hypertension 10/22/2007    Consultants None  Imaging: No results found.  Procedures Dr. Donne Hazel (02/20/2021) - Right mastectomy, Injection of magtrace for delayed sentinel lymph node identification  Hospital Course:  Nancy Liu is a 80yo female PMH Clinical stage 0 right breast cancer who was admitted to [facility] 02/20/21 for procedure listed above. Tolerated procedure well and was transferred to the floor.  On POD1, the patient was voiding well, tolerating diet, ambulating well, pain well controlled, vital signs stable, incisions c/d/i and felt stable for discharge home.  Patient will follow up as below and knows to call with questions or concerns.    I have personally reviewed the patients medication history on the [place]  controlled substance database.    Physical Exam: General:  Alert, NAD, pleasant, comfortable Pulm: rate and effort normal Abd:  Soft, ND, NT Breast: incision cdi with steri strips in place and no erythema or drainage, JP with serosanguinous fluid in bulb  Allergies as of 02/21/2021       Reactions   Protonix [pantoprazole Sodium]    DRY LIPS        Medication List     TAKE these medications    acetaminophen 500 MG tablet Commonly known as: TYLENOL Take 1,000 mg by mouth every 6 (six) hours as needed for moderate pain or headache.   albuterol 108 (90 Base) MCG/ACT inhaler Commonly known as: VENTOLIN HFA Inhale 2 puffs into the lungs every 6 (six) hours as needed for wheezing or shortness of breath.   amLODipine 10 MG tablet Commonly known as: NORVASC TAKE ONE TABLET BY MOUTH EVERYDAY AT BEDTIME   aspirin EC 81 MG tablet Take 81 mg by mouth daily. Swallow whole.   benzonatate 100 MG capsule Commonly known as: TESSALON Take 2 capsules (200 mg total) by mouth every 8 (eight) hours as needed.   carvedilol 6.25 MG tablet Commonly known as: COREG TAKE ONE TABLET BY MOUTH BEFORE BREAKFAST and TAKE ONE TABLET BY MOUTH EVERYDAY AT BEDTIME   cetirizine 5 MG tablet Commonly known as: ZYRTEC Take 1 tablet (5 mg total) by mouth daily. What changed:  when to take this reasons to take this   ergocalciferol 1.25 MG (50000 UT) capsule Commonly known as: VITAMIN D2 Take 1 capsule (50,000 Units total) by mouth once a week.   irbesartan-hydrochlorothiazide 300-12.5 MG tablet Commonly known as:  AVALIDE Take 1 tablet by mouth daily before breakfast.   iron polysaccharides 150 MG capsule Commonly known as: NIFEREX Take 150 mg by mouth daily before breakfast.   lansoprazole 30 MG capsule Commonly known as: PREVACID Take 1 capsule (30 mg total) by mouth 2 (two) times daily before a meal.   Latanoprostene Bunod 0.024 % Soln Place 1 drop into both eyes once a week.    metFORMIN 1000 MG tablet Commonly known as: GLUCOPHAGE Take 500 mg by mouth at bedtime.   nitroGLYCERIN 0.4 MG SL tablet Commonly known as: NITROSTAT Place 1 tablet (0.4 mg total) under the tongue every 5 (five) minutes as needed for chest pain.   rosuvastatin 20 MG tablet Commonly known as: CRESTOR TAKE ONE TABLET BY MOUTH EVERYDAY AT BEDTIME   sennosides-docusate sodium 8.6-50 MG tablet Commonly known as: SENOKOT-S Take 2 tablets by mouth daily as needed for constipation.   traMADol 50 MG tablet Commonly known as: ULTRAM Take 2 tablets (100 mg total) by mouth every 6 (six) hours as needed.   triamcinolone ointment 0.5 % Commonly known as: KENALOG Apply 1 application topically 2 (two) times daily as needed (rash).   Turmeric 500 MG Caps Take 500 mg by mouth daily.          Follow-up Information     Rolm Bookbinder, MD Follow up in 2 week(s).   Specialty: General Liu Contact information: [place] [id number] [phone]                 Signed: Wellington Hampshire, [facility] Liu 02/21/2021, 8:22 AM Please see Amion for pager number during day hours 7:00am-4:30pm

## 2021-02-21 NOTE — Progress Notes (Signed)
Nancy Liu to be D/C'd Home per MD order.  Discussed with the patient and all questions fully answered.  IV catheter discontinued intact. Site without signs and symptoms of complications. Dressing and pressure applied.  An After Visit Summary was printed and given to the patient. Patient prescription sent to pharmacy.  D/c education completed with patient/family including follow up instructions, medication list, d/c activities limitations if indicated, with other d/c instructions as indicated by MD - patient able to verbalize understanding, all questions fully answered. Patient and patient family educated and comfortable with JP drain care.  Patient instructed to return to ED, call 911, or call MD for any changes in condition.   Patient escorted via [place], and D/C home via private auto.  Manuella Ghazi 02/21/2021 8:50 AM

## 2021-02-21 NOTE — Anesthesia Postprocedure Evaluation (Signed)
Anesthesia Post Note  Patient: DEBROH SIELOFF  Procedure(s) Performed: RIGHT TOTAL MASTECTOMY (Right: Breast)     Patient location during evaluation: PACU Anesthesia Type: General Level of consciousness: awake and alert Pain management: pain level controlled Vital Signs Assessment: post-procedure vital signs reviewed and stable Respiratory status: spontaneous breathing, nonlabored ventilation, respiratory function stable and patient connected to nasal cannula oxygen Cardiovascular status: blood pressure returned to baseline and stable Postop Assessment: no apparent nausea or vomiting Anesthetic complications: no   No notable events documented.  Last Vitals:  Vitals:   02/20/21 2349 02/21/21 0410  BP: 120/68 125/73  Pulse: 74 76  Resp:    Temp: 36.6 C 37.1 C  SpO2: 100% 99%    Last Pain:  Vitals:   02/21/21 0410  TempSrc: Oral  PainSc:                  C[place] S

## 2021-02-25 ENCOUNTER — Encounter: Payer: Self-pay | Admitting: *Deleted

## 2021-02-25 LAB — SURGICAL PATHOLOGY

## 2021-03-01 DIAGNOSIS — I10 Essential (primary) hypertension: Secondary | ICD-10-CM | POA: Diagnosis not present

## 2021-03-01 DIAGNOSIS — E7849 Other hyperlipidemia: Secondary | ICD-10-CM | POA: Diagnosis not present

## 2021-03-01 DIAGNOSIS — K219 Gastro-esophageal reflux disease without esophagitis: Secondary | ICD-10-CM | POA: Diagnosis not present

## 2021-03-01 DIAGNOSIS — E119 Type 2 diabetes mellitus without complications: Secondary | ICD-10-CM | POA: Diagnosis not present

## 2021-03-05 ENCOUNTER — Ambulatory Visit (INDEPENDENT_AMBULATORY_CARE_PROVIDER_SITE_OTHER): Payer: PPO | Admitting: Gastroenterology

## 2021-03-05 ENCOUNTER — Encounter: Payer: Self-pay | Admitting: Gastroenterology

## 2021-03-05 ENCOUNTER — Other Ambulatory Visit: Payer: Self-pay

## 2021-03-05 DIAGNOSIS — D500 Iron deficiency anemia secondary to blood loss (chronic): Secondary | ICD-10-CM

## 2021-03-05 DIAGNOSIS — K219 Gastro-esophageal reflux disease without esophagitis: Secondary | ICD-10-CM

## 2021-03-05 NOTE — Progress Notes (Signed)
Primary Care Physician:  Lucianne Lei, MD  Primary GI: Dr. Abbey Chatters  Patient Location: Home   Provider Location: [facility] office   Reason for Visit: Follow-up    Persons present on the virtual encounter, with roles:    Total time (minutes) spent on medical discussion: 10 minutes   Due to COVID-19, visit was conducted using virtual method.  Visit was requested by patient.  Virtual Visit via Telephone Note Due to COVID-19, visit is conducted virtually and was requested by patient.   I connected with Nancy Liu on 03/06/21 at  3:00 PM EDT by telephone and verified that I am speaking with the correct person using two identifiers.   I discussed the limitations, risks, security and privacy concerns of performing an evaluation and management service by telephone and the availability of in person appointments. I also discussed with the patient that there may be a patient responsible charge related to this service. The patient expressed understanding and agreed to proceed.  Chief Complaint  Patient presents with   Anemia    Stools are dark due to Iron. Had breast removed 02/20/21     History of Present Illness: 80 y.o. female presenting today with a history of IDA, with EGD and colonoscopy in October 2017 revealing many non-bleeding gastric ulcers, gastroduodenitis (no H. 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 Diverticulosis and non-bleeding hemorrhoids. Repeat EGD in January 2018 showed erosive gastritis but ulcers had healed. Due to reports of melena, she underwent EGD Sept 2021 focal inflammation in the gastric antrum consistent with gastritis on biopsies. No H.pylori. CT in February 2021 with questionable fullness in the pancreatic head.  6 mm cyst in the pancreatic body.  MRCP negative for mass or ductal dilation. To wrap up IDA evaluation, she completed capsule study in May 2022: Few gastric and small bowel erosions and possible small bowel AVM as outlined above. No  masses. No active bleeding lesions noted during this study.  Pertinent images reviewed with Dr. Abbey Chatters.   Recent diagnosis of right breast DCIS. Underwent right total mastectomy on 02/20/21.   IDA: followed by Hematology. Currently on oral iron tablets. Black stool but on iron. Hgb 12.9 recently.    Constipation: resolved. Bowels have been moving well. Drinking Ensure.   GERD: Prevacid BID. No dysphagia. No abdominal pain.   Appetite not good. Weight 147. Similar to prior. No significant weight change.        Past Medical History:  Diagnosis Date   Arteriosclerotic cardiovascular disease (ASCVD) 2001   Bare-metal stent placed in the right coronary artery in 12/01; residual 50% lesion of the first diagonal and mid LAD   Breast cancer ([place])    Cerebrovascular disease    COPD (chronic obstructive pulmonary disease) (HCC)    Cyst, dermoid, arm, left 03/26/2018   Diabetes mellitus    excellent control with a low-dose of a single oral agent   Family history of breast cancer 01/23/2021   Family history of ovarian cancer 01/23/2021   GERD (gastroesophageal reflux disease)    with ulcers   History of right coronary artery stent placement 2000   Hyperlipidemia    Hypertension    Sleep apnea    Thalassemia minor    Tobacco abuse, in remission    35 pack years; Quit in 1980     Past Surgical History:  Procedure Laterality Date   BIOPSY  02/28/2020   Procedure: BIOPSY;  Surgeon: Eloise Harman, DO;  [facility];  Service: Endoscopy;;   COLONOSCOPY  2006   Dr. Collene Mares: normal   COLONOSCOPY  2000   Dr. Everlean Cherry: normal    COLONOSCOPY N/A 03/03/2016   Dr. Oneida Alar: diverticulosis, non-bleeding hemorrhoids, redundant left colon.   DILATION AND CURETTAGE OF UTERUS     ESOPHAGOGASTRODUODENOSCOPY  2000   Dr. Everlean Cherry: gastritis    ESOPHAGOGASTRODUODENOSCOPY  2006   Dr. Collene Mares: small hiatal hernia, gastritis, negative H.pylori    ESOPHAGOGASTRODUODENOSCOPY N/A 03/03/2016    Procedure: ESOPHAGOGASTRODUODENOSCOPY (EGD);  Surgeon: Danie Binder, MD;  Location: [facility];  Service: Endoscopy;  Laterality: N/A;   ESOPHAGOGASTRODUODENOSCOPY N/A 06/03/2016   Dr. Oneida Alar: nonaggressive gastritis due to aspirin use. Previous ulcers had healed.   ESOPHAGOGASTRODUODENOSCOPY (EGD) WITH PROPOFOL N/A 02/28/2020   focal inflammation in the gastric antrum consistent with gastritis on biopsies. No H.pylori.    EXCISION OF KELOID Left 03/26/2018   Procedure: EXCISION OF LEFT ARM MASS;  Surgeon: Fanny Skates, MD;  Location: [place];  Service: General;  Laterality: Left;   GIVENS CAPSULE STUDY N/A 10/10/2020   Procedure: GIVENS CAPSULE STUDY;  Surgeon: Eloise Harman, DO;  [facility];  Service: Endoscopy;  Laterality: N/A;  7:30am   INGUINAL HERNIA REPAIR  1970s   Right   TOTAL MASTECTOMY Right 02/20/2021   Procedure: RIGHT TOTAL MASTECTOMY;  Surgeon: Rolm Bookbinder, MD;  Location: [place];  Service: General;  Laterality: Right;   VAGINAL HYSTERECTOMY  1980   Unilateral oophorectomy     Current Meds  Medication Sig   acetaminophen (TYLENOL) 500 MG tablet Take 1,000 mg by mouth every 6 (six) hours as needed for moderate pain or headache.   albuterol (VENTOLIN HFA) 108 (90 Base) MCG/ACT inhaler Inhale 2 puffs into the lungs every 6 (six) hours as needed for wheezing or shortness of breath.   amLODipine (NORVASC) 10 MG tablet TAKE ONE TABLET BY MOUTH EVERYDAY AT BEDTIME   aspirin EC 81 MG tablet Take 81 mg by mouth daily. Swallow whole.   benzonatate (TESSALON) 100 MG capsule Take 2 capsules (200 mg total) by mouth every 8 (eight) hours as needed.   carvedilol (COREG) 6.25 MG tablet TAKE ONE TABLET BY MOUTH BEFORE BREAKFAST and TAKE ONE TABLET BY MOUTH EVERYDAY AT BEDTIME   cetirizine (ZYRTEC) 5 MG tablet Take 1 tablet (5 mg total) by mouth daily. (Patient taking differently: Take 5 mg by mouth daily as needed for allergies.)   ergocalciferol  (VITAMIN D2) 1.25 MG (50000 UT) capsule Take 1 capsule (50,000 Units total) by mouth once a week.   irbesartan-hydrochlorothiazide (AVALIDE) 300-12.5 MG tablet Take 1 tablet by mouth daily before breakfast.   iron polysaccharides (NIFEREX) 150 MG capsule Take 150 mg by mouth daily before breakfast.   lansoprazole (PREVACID) 30 MG capsule Take 1 capsule (30 mg total) by mouth 2 (two) times daily before a meal.   Latanoprostene Bunod 0.024 % SOLN Place 1 drop into both eyes once a week.   metFORMIN (GLUCOPHAGE) 1000 MG tablet Take 500 mg by mouth at bedtime.   nitroGLYCERIN (NITROSTAT) 0.4 MG SL tablet Place 1 tablet (0.4 mg total) under the tongue every 5 (five) minutes as needed for chest pain.   rosuvastatin (CRESTOR) 20 MG tablet TAKE ONE TABLET BY MOUTH EVERYDAY AT BEDTIME   sennosides-docusate sodium (SENOKOT-S) 8.6-50 MG tablet Take 2 tablets by mouth daily as needed for constipation.   traMADol (ULTRAM) 50 MG tablet Take 2 tablets (100 mg total) by mouth  every 6 (six) hours as needed.   triamcinolone ointment (KENALOG) 0.5 % Apply 1 application topically 2 (two) times daily as needed (rash).   Turmeric 500 MG CAPS Take 500 mg by mouth daily.     Family History  Problem Relation Age of Onset   Heart disease Mother        also hypertension and asthma   Lung cancer Mother        dx 11s   Ovarian cancer Mother        dx before 110   Diabetes Half-Sister    Breast cancer Half-Sister 32   Multiple myeloma Nephew 14       war-related exposures   Colon cancer Neg Hx    Colon polyps Neg Hx     Social History   Socioeconomic History   Marital status: Divorced    Spouse name: Not on file   Number of children: 3   Years of education: Not on file   Highest education level: Not on file  Occupational History   Occupation: Retired    Comment: Factory work  Tobacco Use   Smoking status: Former    Packs/day: 1.00    Years: 18.00    Pack years: 18.00    Types: Cigarettes    Start  date: 06/02/1965    Quit date: 06/09/1980    Years since quitting: 40.7   Smokeless tobacco: Never  Vaping Use   Vaping Use: Never used  Substance and Sexual Activity   Alcohol use: No    Alcohol/week: 0.0 standard drinks   Drug use: No   Sexual activity: Never  Other Topics Concern   Not on file  Social History Narrative   Lives with daughter    Social Determinants of Health   Financial Resource Strain: Not on file  Food Insecurity: No Food Insecurity   Worried About Charity fundraiser in the Last Year: Never true   [place] in the Last Year: Never true  Transportation Needs: No Transportation Needs   Lack of Transportation (Medical): No   Lack of Transportation (Non-Medical): No  Physical Activity: Not on file  Stress: Not on file  Social Connections: Not on file       Review of Systems: Gen: Denies fever, chills, anorexia. Denies fatigue, weakness, weight loss.  CV: Denies chest pain, palpitations, syncope, peripheral edema, and claudication. Resp: Denies dyspnea at rest, cough, wheezing, coughing up blood, and pleurisy. GI: see HPI Derm: Denies rash, itching, dry skin Psych: Denies depression, anxiety, memory loss, confusion. No homicidal or suicidal ideation.  Heme: Denies bruising, bleeding, and enlarged lymph nodes.   Observations/Objective: No distress. Unable to perform physical exam due to telephone encounter. No video available.   Assessment and Plan:  Very pleasant 80 year old female with history of IDA s/p full evaluation and likely due to small bowel AVMs, constipation, and GERD, completing visit today via telephone call.  From a GI standpoint, she is stable. Constipation has resolved without need for prescriptive agents. IDA followed by Hematology and Hgb normal recently.   Continues on Prevacid BID with good control.  Appetite has not been ideal. Weight 147 at home. Weight has been fluctuating and is similar to prior visits. Will have her return  in 4 months for in-person visit and weight check.     Follow Up Instructions:    I discussed the assessment and treatment plan with the patient. The patient was provided an opportunity to ask questions and  all were answered. The patient agreed with the plan and demonstrated an understanding of the instructions.   The patient was advised to call back or seek an in-person evaluation if the symptoms worsen or if the condition fails to improve as anticipated.  I provided 10 minutes of face-to-face time during this MyChart Video encounter.  Annitta Needs, PhD, ANP-BC [facility]

## 2021-03-05 NOTE — Patient Instructions (Signed)
It was good to talk to you!  We will see you in 4 months!  Please call with any concerns in the meantime!   Annitta Needs, PhD, ANP-BC [facility]

## 2021-03-12 NOTE — Progress Notes (Signed)
[place]   [phone] Fax:([phone]   Clinic Follow up Note   Patient Care Team: Lucianne Lei, MD as PCP - General (Family Medicine) Harl Bowie, Alphonse Guild, MD as PCP - Cardiology (Cardiology) Lucianne Lei, MD (Family Medicine) Rockwell Germany, RN as Oncology Nurse Navigator Mauro Kaufmann, RN as Oncology Nurse Navigator Truitt Merle, MD as Consulting Physician (Hematology) Rolm Bookbinder, MD as Consulting Physician (General Surgery) Kyung Rudd, MD as Consulting Physician (Radiation Oncology) Kyung Rudd, MD as Consulting Physician (Radiation Oncology) 03/13/2021  CHIEF COMPLAINT:  f/u right breast DCIS   ASSESSMENT & PLAN: 80 yo female   Multifocal right breast DCIS, grade 2, ER and PR positive, and ALH -Status post right mastectomy.  I reviewed her which showed multifocal intermediate grade DCIS, surgical margins were negative.  It also showed atypical lobular hyperplasia, which is a high risk for future breast cancer. -We discussed her future risk of left breast cancer, and the role of adjuvant or anastrozole to reduce her risk.  Due to her advanced age, potential side effects, I do not strongly recommend her to take it, and she agrees.  -We discussed breast cancer surveillance, she will continue annual screening mammogram, I also recommended her to consider breast MRI, 6 months apart from mammogram, she is agreeable. -She prefers to follow-up with [place] next year.  2. Bone health  --Her most recent DEXA in 2008. -DEXA scan ordered on last visit, I encouraged her to get it scheduled   Plan -no adjuvant antiestrogen therapy -Left breast screening mammogram January, breat MRI in July 2023 -f/u with NP Lacie in August 2023  No problem-specific Assessment & Plan notes found for this encounter.  SUMMARY OF ONCOLOGIC HISTORY: Oncology History  Ductal carcinoma in situ (DCIS) of right breast  01/01/2021 Mammogram   FINDINGS: Multiple  circumscribed oval masses are noted in the MEDIAL portion of the RIGHT breast. Masses vary from 1-4 millimeters in diameter and appear contiguous, spanning 8.9 x 3.6 x 5.1 centimeters. There are faint calcifications spanning 4.2 x 1.9 x 3.5 centimeters in this same, segmental distribution.   01/14/2021 Cancer Staging   Staging form: Breast, AJCC 8th Edition - Clinical stage from 01/14/2021: Stage 0 (cTis (DCIS), cN0, cM0, G2, ER+, PR+, HER2: Not Assessed) - Signed by Truitt Merle, MD on 01/21/2021 Stage prefix: Initial diagnosis Histologic grading system: 3 grade system   01/14/2021 Pathology Results   Diagnosis 1. Breast, right, needle core biopsy, anterior extent - DUCTAL CARCINOMA IN SITU, INTERMEDIATE GRADE WITH CALCIFICATIONS. SEE NOTE 2. Breast, right, needle core biopsy, posterior extent - DUCTAL CARCINOMA IN SITU, INTERMEDIATE GRADE WITH CALCIFICATIONS. SEE NOTE Diagnosis Note 1. and 2. DCIS measures 0.6 cm in part 1 and 0.4 cm in part 2.  1. PROGNOSTIC INDICATORS Results: IMMUNOHISTOCHEMICAL AND MORPHOMETRIC ANALYSIS PERFORMED MANUALLY Estrogen Receptor: >95%, POSITIVE, STRONG STAINING INTENSITY Progesterone Receptor: >95%, POSITIVE, STRONG STAINING INTENSITY     01/17/2021 Initial Diagnosis   Ductal carcinoma in situ (DCIS) of right breast   02/05/2021 Genetic Testing   Negative hereditary cancer genetic testing: no pathogenic variants detected in [facility] CustomNext-Cancer +RNAinsight Panel.  The report date is February 05, 2021.   The CustomNext-Cancer+RNAinsight panel offered by [facility] includes sequencing and rearrangement analysis for the following 47 genes:  APC, ATM, AXIN2, BARD1, BMPR1A, BRCA1, BRCA2, BRIP1, CDH1, CDK4, CDKN2A, CHEK2, DICER1, EPCAM, GREM1, HOXB13, MEN1, MLH1, MSH2, MSH3, MSH6, MUTYH, NBN, NF1, NF2, NTHL1, PALB2, PMS2, POLD1, POLE, PTEN, RAD51C, RAD51D, RECQL, RET,  SDHA, SDHAF2, SDHB, SDHC, SDHD, SMAD4, SMARCA4, STK11, TP53, TSC1, TSC2, and VHL.  RNA data is  routinely analyzed for use in variant interpretation for all genes.   02/20/2021 Cancer Staging   Staging form: Breast, AJCC 8th Edition - Pathologic stage from 02/20/2021: No Stage Recommended (pTis (DCIS), pNX, cM1, G2, ER+, PR+, HER2: Not Assessed) - Signed by Truitt Merle, MD on 03/12/2021 Stage prefix: Initial diagnosis Histologic grading system: 3 grade system Residual tumor (R): R0 - None     CURRENT THERAPY: Cancer surveillance  INTERVAL HISTORY: Ms Eveland returns for follow-up.  She presents to clinic with her daughter.  She underwent a right mastectomy on February 20, 2021.  She is recovering well from surgery, drain was removed last week.  She denies any significant pain or other new symptoms.  REVIEW OF SYSTEMS:   Constitutional: Denies fevers, chills or abnormal weight loss Eyes: Denies blurriness of vision Ears, nose, mouth, throat, and face: Denies mucositis or sore throat Respiratory: Denies cough, dyspnea or wheezes Cardiovascular: Denies palpitation, chest discomfort or lower extremity swelling Gastrointestinal:  Denies nausea, heartburn or change in bowel habits Skin: Denies abnormal skin rashes Lymphatics: Denies new lymphadenopathy or easy bruising Neurological:Denies numbness, tingling or new weaknesses Behavioral/Psych: Mood is stable, no new changes  All other systems were reviewed with the patient and are negative.  MEDICAL HISTORY:  Past Medical History:  Diagnosis Date   Arteriosclerotic cardiovascular disease (ASCVD) 2001   Bare-metal stent placed in the right coronary artery in 12/01; residual 50% lesion of the first diagonal and mid LAD   Breast cancer (HCC)    Cerebrovascular disease    COPD (chronic obstructive pulmonary disease) (HCC)    Cyst, dermoid, arm, left 03/26/2018   Diabetes mellitus    excellent control with a low-dose of a single oral agent   Family history of breast cancer 01/23/2021   Family history of ovarian cancer 01/23/2021    GERD (gastroesophageal reflux disease)    with ulcers   History of right coronary artery stent placement 2000   Hyperlipidemia    Hypertension    Sleep apnea    Thalassemia minor    Tobacco abuse, in remission    35 pack years; Quit in 1980    SURGICAL HISTORY: Past Surgical History:  Procedure Laterality Date   BIOPSY  02/28/2020   Procedure: BIOPSY;  Surgeon: Eloise Harman, DO;  [facility];  Service: Endoscopy;;   COLONOSCOPY  2006   Dr. Collene Mares: normal   COLONOSCOPY  2000   Dr. Everlean Cherry: normal    COLONOSCOPY N/A 03/03/2016   Dr. Oneida Alar: diverticulosis, non-bleeding hemorrhoids, redundant left colon.   DILATION AND CURETTAGE OF UTERUS     ESOPHAGOGASTRODUODENOSCOPY  2000   Dr. Everlean Cherry: gastritis    ESOPHAGOGASTRODUODENOSCOPY  2006   Dr. Collene Mares: small hiatal hernia, gastritis, negative H.pylori    ESOPHAGOGASTRODUODENOSCOPY N/A 03/03/2016   Procedure: ESOPHAGOGASTRODUODENOSCOPY (EGD);  Surgeon: Danie Binder, MD;  [facility];  Service: Endoscopy;  Laterality: N/A;   ESOPHAGOGASTRODUODENOSCOPY N/A 06/03/2016   Dr. Oneida Alar: nonaggressive gastritis due to aspirin use. Previous ulcers had healed.   ESOPHAGOGASTRODUODENOSCOPY (EGD) WITH PROPOFOL N/A 02/28/2020   focal inflammation in the gastric antrum consistent with gastritis on biopsies. No H.pylori.    EXCISION OF KELOID Left 03/26/2018   Procedure: EXCISION OF LEFT ARM MASS;  Surgeon: Fanny Skates, MD;  Location: [place];  Service: General;  Laterality: Left;   GIVENS CAPSULE STUDY N/A  10/10/2020   Procedure: GIVENS CAPSULE STUDY;  Surgeon: Eloise Harman, DO;  [facility];  Service: Endoscopy;  Laterality: N/A;  7:30am   INGUINAL HERNIA REPAIR  1970s   Right   TOTAL MASTECTOMY Right 02/20/2021   Procedure: RIGHT TOTAL MASTECTOMY;  Surgeon: Rolm Bookbinder, MD;  Location: [place];  Service: General;  Laterality: Right;   VAGINAL HYSTERECTOMY  1980   Unilateral oophorectomy     I have reviewed the social history and family history with the patient and they are unchanged from previous note.  ALLERGIES:  is allergic to protonix [pantoprazole sodium].  MEDICATIONS:  Current Outpatient Medications  Medication Sig Dispense Refill   acetaminophen (TYLENOL) 500 MG tablet Take 1,000 mg by mouth every 6 (six) hours as needed for moderate pain or headache.     albuterol (VENTOLIN HFA) 108 (90 Base) MCG/ACT inhaler Inhale 2 puffs into the lungs every 6 (six) hours as needed for wheezing or shortness of breath. 8 g 2   amLODipine (NORVASC) 10 MG tablet TAKE ONE TABLET BY MOUTH EVERYDAY AT BEDTIME 90 tablet 3   aspirin EC 81 MG tablet Take 81 mg by mouth daily. Swallow whole.     benzonatate (TESSALON) 100 MG capsule Take 2 capsules (200 mg total) by mouth every 8 (eight) hours as needed. 60 capsule 0   carvedilol (COREG) 6.25 MG tablet TAKE ONE TABLET BY MOUTH BEFORE BREAKFAST and TAKE ONE TABLET BY MOUTH EVERYDAY AT BEDTIME 180 tablet 3   cetirizine (ZYRTEC) 5 MG tablet Take 1 tablet (5 mg total) by mouth daily. (Patient taking differently: Take 5 mg by mouth daily as needed for allergies.) 30 tablet 0   ergocalciferol (VITAMIN D2) 1.25 MG (50000 UT) capsule Take 1 capsule (50,000 Units total) by mouth once a week. 16 capsule 3   irbesartan-hydrochlorothiazide (AVALIDE) 300-12.5 MG tablet Take 1 tablet by mouth daily before breakfast.     iron polysaccharides (NIFEREX) 150 MG capsule Take 150 mg by mouth daily before breakfast.     lansoprazole (PREVACID) 30 MG capsule Take 1 capsule (30 mg total) by mouth 2 (two) times daily before a meal. 30 capsule 5   Latanoprostene Bunod 0.024 % SOLN Place 1 drop into both eyes once a week.     metFORMIN (GLUCOPHAGE) 1000 MG tablet Take 500 mg by mouth at bedtime.     nitroGLYCERIN (NITROSTAT) 0.4 MG SL tablet Place 1 tablet (0.4 mg total) under the tongue every 5 (five) minutes as needed for chest pain. 25 tablet 3   rosuvastatin  (CRESTOR) 20 MG tablet TAKE ONE TABLET BY MOUTH EVERYDAY AT BEDTIME 90 tablet 3   sennosides-docusate sodium (SENOKOT-S) 8.6-50 MG tablet Take 2 tablets by mouth daily as needed for constipation.     traMADol (ULTRAM) 50 MG tablet Take 2 tablets (100 mg total) by mouth every 6 (six) hours as needed. 10 tablet 0   triamcinolone ointment (KENALOG) 0.5 % Apply 1 application topically 2 (two) times daily as needed (rash).     Turmeric 500 MG CAPS Take 500 mg by mouth daily.     No current facility-administered medications for this visit.    PHYSICAL EXAMINATION: ECOG PERFORMANCE STATUS: 1 - Symptomatic but completely ambulatory  Vitals:   03/13/21 0859  BP: (!) 144/81  Pulse: 78  Resp: 18  Temp: 97.7 F (36.5 C)  SpO2: 100%   Filed Weights   03/13/21 0859  Weight: 147 lb 14.4 oz (67.1 kg)  GENERAL:alert, no distress and comfortable SKIN: skin color, texture, turgor are normal, no rashes or significant lesions EYES: normal, Conjunctiva are pink and non-injected, sclera clear OROPHARYNX:no exudate, no erythema and lips, buccal mucosa, and tongue normal  NECK: supple, thyroid normal size, non-tender, without nodularity LYMPH:  no palpable lymphadenopathy in the cervical, axillary or inguinal LUNGS: clear to auscultation and percussion with normal breathing effort HEART: regular rate & rhythm and no murmurs and no lower extremity edema ABDOMEN:abdomen soft, non-tender and normal bowel sounds Musculoskeletal:no cyanosis of digits and no clubbing  NEURO: alert & oriented x 3 with fluent speech, no focal motor/sensory deficits BREAST: Status post to right side mastectomy, surgical incision is healing well, no discharge or skin erythema.  LABORATORY DATA:  I have reviewed the data as listed CBC Latest Ref Rng & Units 01/23/2021 12/26/2020 10/03/2020  WBC 4.0 - 10.5 K/uL 10.9(H) 8.4 10.2  Hemoglobin 12.0 - 15.0 g/dL 12.9 12.5 12.4  Hematocrit 36.0 - 46.0 % 41.1 40.5 39.6  Platelets 150  - 400 K/uL 264 280 287     CMP Latest Ref Rng & Units 01/23/2021 12/26/2020 10/03/2020  Glucose 70 - 99 mg/dL 112(H) 109(H) 94  BUN 8 - 23 mg/dL _0 Creatinine 0.44 - 1.00 mg/dL 1.22(H) 1.16(H) 1.07(H)  Sodium 135 - 145 mmol/L 142 138 136  Potassium 3.5 - 5.1 mmol/L 4.2 4.2 3.5  Chloride 98 - 111 mmol/L 101 101 100  CO2 22 - 32 mmol/L _1 Calcium 8.9 - 10.3 mg/dL 10.0 9.2 9.2  Total Protein 6.5 - 8.1 g/dL 7.4 7.0 -  Total Bilirubin 0.3 - 1.2 mg/dL 0.6 0.7 -  Alkaline Phos 38 - 126 U/L 65 58 -  AST 15 - 41 U/L 24 21 -  ALT 0 - 44 U/L 17 15 -      RADIOGRAPHIC STUDIES: I have personally reviewed the radiological images as listed and agreed with the findings in the report. No results found.      Orders Placed This Encounter  Procedures   MR BREAST BILATERAL W WO CONTRAST INC CAD    Standing Status:   Future    Standing Expiration Date:   03/13/2022    Order Specific Question:   If indicated for the ordered procedure, I authorize the administration of contrast media per Radiology protocol    Answer:   Yes    Order Specific Question:   What is the patient's sedation requirement?    Answer:   No Sedation    Order Specific Question:   Does the patient have a pacemaker or implanted devices?    Answer:   No    Order Specific Question:   Radiology Contrast Protocol - do NOT remove file path    Answer:   [email or web address]    Order Specific Question:   Preferred imaging location?    Answer:   [facility] (table limit - 550lbs)   MM Digital Screening Unilat L    Standing Status:   Future    Standing Expiration Date:   03/13/2022    Order Specific Question:   Reason for Exam (SYMPTOM  OR DIAGNOSIS REQUIRED)    Answer:   screening    Order Specific Question:   Preferred imaging location?    Answer:   [facility]   Ambulatory referral to Physical Therapy    Referral Priority:   Routine    Referral Type:   Physical  Medicine  Referral Reason:   Specialty Services Required    Requested Specialty:   Physical Therapy    Number of Visits Requested:   1   All questions were answered. The patient knows to call the clinic with any problems, questions or concerns. No barriers to learning was detected. I spent 25 minutes counseling the patient face to face. The total time spent in the appointment was 30 minutes and more than 50% was on counseling and review of test results     Truitt Merle, MD 03/13/21

## 2021-03-13 ENCOUNTER — Encounter: Payer: Self-pay | Admitting: Hematology

## 2021-03-13 ENCOUNTER — Inpatient Hospital Stay: Payer: PPO | Attending: Hematology | Admitting: Hematology

## 2021-03-13 ENCOUNTER — Encounter: Payer: Self-pay | Admitting: *Deleted

## 2021-03-13 ENCOUNTER — Other Ambulatory Visit: Payer: Self-pay

## 2021-03-13 VITALS — BP 144/81 | HR 78 | Temp 97.7°F | Resp 18 | Ht 62.5 in | Wt 147.9 lb

## 2021-03-13 DIAGNOSIS — Z7984 Long term (current) use of oral hypoglycemic drugs: Secondary | ICD-10-CM | POA: Diagnosis not present

## 2021-03-13 DIAGNOSIS — D0511 Intraductal carcinoma in situ of right breast: Secondary | ICD-10-CM

## 2021-03-13 DIAGNOSIS — J449 Chronic obstructive pulmonary disease, unspecified: Secondary | ICD-10-CM | POA: Insufficient documentation

## 2021-03-13 DIAGNOSIS — I10 Essential (primary) hypertension: Secondary | ICD-10-CM | POA: Insufficient documentation

## 2021-03-13 DIAGNOSIS — Z7982 Long term (current) use of aspirin: Secondary | ICD-10-CM | POA: Diagnosis not present

## 2021-03-13 DIAGNOSIS — Z79899 Other long term (current) drug therapy: Secondary | ICD-10-CM | POA: Insufficient documentation

## 2021-03-13 DIAGNOSIS — Z9011 Acquired absence of right breast and nipple: Secondary | ICD-10-CM | POA: Insufficient documentation

## 2021-03-13 DIAGNOSIS — G35 Multiple sclerosis: Secondary | ICD-10-CM | POA: Diagnosis not present

## 2021-03-13 DIAGNOSIS — E119 Type 2 diabetes mellitus without complications: Secondary | ICD-10-CM | POA: Insufficient documentation

## 2021-03-21 DIAGNOSIS — E1169 Type 2 diabetes mellitus with other specified complication: Secondary | ICD-10-CM | POA: Diagnosis not present

## 2021-03-21 DIAGNOSIS — D0511 Intraductal carcinoma in situ of right breast: Secondary | ICD-10-CM | POA: Diagnosis not present

## 2021-03-21 DIAGNOSIS — I10 Essential (primary) hypertension: Secondary | ICD-10-CM | POA: Diagnosis not present

## 2021-03-21 DIAGNOSIS — Z9011 Acquired absence of right breast and nipple: Secondary | ICD-10-CM | POA: Diagnosis not present

## 2021-03-21 DIAGNOSIS — E782 Mixed hyperlipidemia: Secondary | ICD-10-CM | POA: Diagnosis not present

## 2021-03-21 DIAGNOSIS — F0631 Mood disorder due to known physiological condition with depressive features: Secondary | ICD-10-CM | POA: Diagnosis not present

## 2021-04-01 DIAGNOSIS — E119 Type 2 diabetes mellitus without complications: Secondary | ICD-10-CM | POA: Diagnosis not present

## 2021-04-01 DIAGNOSIS — I10 Essential (primary) hypertension: Secondary | ICD-10-CM | POA: Diagnosis not present

## 2021-04-01 DIAGNOSIS — K219 Gastro-esophageal reflux disease without esophagitis: Secondary | ICD-10-CM | POA: Diagnosis not present

## 2021-04-01 DIAGNOSIS — E7849 Other hyperlipidemia: Secondary | ICD-10-CM | POA: Diagnosis not present

## 2021-04-03 ENCOUNTER — Other Ambulatory Visit: Payer: Self-pay

## 2021-04-03 ENCOUNTER — Ambulatory Visit: Payer: PPO | Attending: Hematology

## 2021-04-03 DIAGNOSIS — R293 Abnormal posture: Secondary | ICD-10-CM | POA: Diagnosis not present

## 2021-04-03 DIAGNOSIS — R600 Localized edema: Secondary | ICD-10-CM | POA: Insufficient documentation

## 2021-04-03 DIAGNOSIS — Z9011 Acquired absence of right breast and nipple: Secondary | ICD-10-CM | POA: Insufficient documentation

## 2021-04-03 DIAGNOSIS — D0511 Intraductal carcinoma in situ of right breast: Secondary | ICD-10-CM | POA: Insufficient documentation

## 2021-04-03 DIAGNOSIS — M25611 Stiffness of right shoulder, not elsewhere classified: Secondary | ICD-10-CM | POA: Diagnosis not present

## 2021-04-03 DIAGNOSIS — Z483 Aftercare following surgery for neoplasm: Secondary | ICD-10-CM | POA: Diagnosis not present

## 2021-04-03 NOTE — Therapy (Signed)
[facility] @ [place], [place], [address] Phone: [phone]   Fax:  [phone]  Physical Therapy Treatment  Patient Details  Name: Nancy Liu MRN: [id number] Date of Birth: 09-12-1940 Referring Provider (PT): Dr. Burr Medico   Encounter Date: 04/03/2021   PT End of Session - 04/03/21 1441     Visit Number 1    Number of Visits 7    Date for PT Re-Evaluation 05/15/21    PT Start Time 2876    PT Stop Time 8115    PT Time Calculation (min) 41 min    Activity Tolerance Patient tolerated treatment well    Behavior During Therapy [facility] for tasks assessed/performed             Past Medical History:  Diagnosis Date   Arteriosclerotic cardiovascular disease (ASCVD) 2001   Bare-metal stent placed in the right coronary artery in 12/01; residual 50% lesion of the first diagonal and mid LAD   Breast cancer ([place])    Cerebrovascular disease    COPD (chronic obstructive pulmonary disease) ([place])    Cyst, dermoid, arm, left 03/26/2018   Diabetes mellitus    excellent control with a low-dose of a single oral agent   Family history of breast cancer 01/23/2021   Family history of ovarian cancer 01/23/2021   GERD (gastroesophageal reflux disease)    with ulcers   History of right coronary artery stent placement 2000   Hyperlipidemia    Hypertension    Sleep apnea    Thalassemia minor    Tobacco abuse, in remission    35 pack years; Quit in 1980    Past Surgical History:  Procedure Laterality Date   BIOPSY  02/28/2020   Procedure: BIOPSY;  Surgeon: Eloise Harman, DO;  [facility];  Service: Endoscopy;;   COLONOSCOPY  2006   Dr. Collene Mares: normal   COLONOSCOPY  2000   Dr. Everlean Cherry: normal    COLONOSCOPY N/A 03/03/2016   Dr. Oneida Alar: diverticulosis, non-bleeding hemorrhoids, redundant left colon.   DILATION AND CURETTAGE OF UTERUS     ESOPHAGOGASTRODUODENOSCOPY  2000   Dr. Everlean Cherry: gastritis     ESOPHAGOGASTRODUODENOSCOPY  2006   Dr. Collene Mares: small hiatal hernia, gastritis, negative H.pylori    ESOPHAGOGASTRODUODENOSCOPY N/A 03/03/2016   Procedure: ESOPHAGOGASTRODUODENOSCOPY (EGD);  Surgeon: Danie Binder, MD;  [facility];  Service: Endoscopy;  Laterality: N/A;   ESOPHAGOGASTRODUODENOSCOPY N/A 06/03/2016   Dr. Oneida Alar: nonaggressive gastritis due to aspirin use. Previous ulcers had healed.   ESOPHAGOGASTRODUODENOSCOPY (EGD) WITH PROPOFOL N/A 02/28/2020   focal inflammation in the gastric antrum consistent with gastritis on biopsies. No H.pylori.    EXCISION OF KELOID Left 03/26/2018   Procedure: EXCISION OF LEFT ARM MASS;  Surgeon: Fanny Skates, MD;  Location: [place];  Service: General;  Laterality: Left;   GIVENS CAPSULE STUDY N/A 10/10/2020   Procedure: GIVENS CAPSULE STUDY;  Surgeon: Eloise Harman, DO;  [facility];  Service: Endoscopy;  Laterality: N/A;  7:30am   INGUINAL HERNIA REPAIR  1970s   Right   TOTAL MASTECTOMY Right 02/20/2021   Procedure: RIGHT TOTAL MASTECTOMY;  Surgeon: Rolm Bookbinder, MD;  Location: [place];  Service: General;  Laterality: Right;   VAGINAL HYSTERECTOMY  1980   Unilateral oophorectomy    There were no vitals filed for this visit.   Subjective Assessment - 04/03/21 1359     Subjective Pt had a right masectomy  on 02/20/21.  Once in a while she will get some pain in the right chest area, and it stays swollen. The drain was removed about a week and a half to 2 weeks after surgery. I still have a small unhealed place in my incision. The MD put some neosporin on it while in the office and I do it at home too.   Pertinent History pt had right mastectomy 02/20/21 for DCIS with atypical lobular hyperplasia ER, PR+.  She does not require radiation or anti-estrogens.NIDDM, CAD, COPD, Heart valve problems                [facility] PT Assessment - 04/03/21 0001       Assessment   Medical Diagnosis Right breast  Cancer    Referring Provider (PT) Dr. Burr Medico    Onset Date/Surgical Date 02/20/21    Hand Dominance Right    Prior Therapy maybe      Precautions   Precaution Comments COPD, NIDDM, CAD      Restrictions   Weight Bearing Restrictions No      Balance Screen   Has the patient fallen in the past 6 months No    Has the patient had a decrease in activity level because of a fear of falling?  No    Is the patient reluctant to leave their home because of a fear of falling?  No      Home Environment   Living Environment Private residence    Living Arrangements Children    Available Help at Discharge Family      Prior Function   Level of [place] Retired    Leisure nothing right now, will resume walking, choir      Cognition   Overall Cognitive Status Within Functional Limits for tasks assessed      Observation/Other Assessments   Observations small open area at T incision she is using neosporin on. Swelling noted at [facility] region and medial to shoulder, and mild under incision site      Posture/Postural Control   Posture/Postural Control Postural limitations    Postural Limitations Rounded Shoulders;Forward head      AROM   Right Shoulder Extension 63 Degrees    Right Shoulder Flexion 145 Degrees    Right Shoulder ABduction 175 Degrees    Right Shoulder External Rotation 101 Degrees    Left Shoulder Extension 63 Degrees    Left Shoulder Flexion 145 Degrees    Left Shoulder ABduction 175 Degrees    Left Shoulder External Rotation 100 Degrees               LYMPHEDEMA/ONCOLOGY QUESTIONNAIRE - 04/03/21 0001       Type   Cancer Type DCIS      Surgeries   Mastectomy Date 02/20/21    Number Lymph Nodes Removed 0      Treatment   Active Chemotherapy Treatment No    Past Chemotherapy Treatment No    Active Radiation Treatment No    Past Radiation Treatment No    Current Hormone Treatment No    Past Hormone Therapy No      What other  symptoms do you have   Are you Having Heaviness or Tightness No    Are you having Pain No    Are you having pitting edema No    Is it Hard or Difficult finding clothes that fit No    Do you have infections No    Is  there Decreased scar mobility Yes                                      PT Long Term Goals - 04/03/21 1442       PT LONG TERM GOAL #1   Title pt will be independent in self MLD to right axillary/chest region    Time 6    Period Weeks    Status New    Target Date 05/15/21      PT LONG TERM GOAL #2   Title Pt will report swelling improved by atleast 50%    Time 6    Period Weeks    Status New    Target Date 05/15/21      PT LONG TERM GOAL #3   Title pt will be independent in home strengthening program    Time 6    Period Weeks    Status New    Target Date 05/15/21                   Plan - 04/03/21 1444     Clinical Impression Statement Pt is s/p Right mastectomy for stage 0 DCIS ER, PR+ with atypical lobular hyperplasia.  She will not have antiestrogens or radiation.  She has been doing a HEP given by MD and she has done an excellent job regaining her shoulder ROM.  Her main complaint is swelling at the right axillary region and medial to the right shoulder, with mild swelling inferior to incision.  Incision still has an unhealed area that is small at the intersection of the T incision. She has been given a script for getting bras and while she is there I suggested she have them check her for a compression bra.    Personal Factors and Comorbidities Comorbidity 3+    Comorbidities Right breast CA, COPD, CAD, heart valve problems    Stability/Clinical Decision Making Stable/Uncomplicated    Clinical Decision Making Low    Rehab Potential Good    PT Frequency 1x / week    PT Duration 6 weeks    PT Treatment/Interventions ADLs/Self Care Home Management;Therapeutic exercise;Manual techniques;Patient/family education;Manual lymph  drainage;Scar mobilization;Passive range of motion    PT Next Visit Plan Pt with concerns about cost;release when independent. Perform MLD to right trunk/chest area of swelling and instruct pt, check bras when she gets, teach UE strength    PT Home Exercise Plan pt is performing 4 post op exercises    Recommended Other Services attending ABC class 11/21    Consulted and Agree with Plan of Care Patient             Patient will benefit from skilled therapeutic intervention in order to improve the following deficits and impairments:  Decreased knowledge of precautions, Decreased skin integrity, Decreased scar mobility, Decreased strength, Increased edema, Postural dysfunction  Visit Diagnosis: Intraductal carcinoma in situ of right breast  Abnormal posture  Aftercare following surgery for neoplasm  Localized edema     Problem List Patient Active Problem List   Diagnosis Date Noted   GERD (gastroesophageal reflux disease) 03/05/2021   S/P mastectomy, right 02/20/2021   Genetic testing 02/12/2021   Family history of breast cancer 01/23/2021   Family history of ovarian cancer 01/23/2021   Ductal carcinoma in situ (DCIS) of right breast 01/17/2021   Black stool 08/24/2020   Non-insulin dependent type 2 diabetes mellitus (  [place]) 03/26/2020   Loss of weight 07/13/2019   Cyst, dermoid, arm, left 03/26/2018   Diarrhea 08/10/2017   Constipation 02/10/2017   AVM (arteriovenous malformation) of small bowel, acquired 02/10/2017   Iron deficiency anemia due to chronic blood loss 07/23/2016   Gastritis and gastroduodenitis    Symptomatic anemia 02/29/2016   Weight loss 04/01/2012   Chest pain on exertion 02/07/2011   Microcytic anemia 01/31/2010   Cerebrovascular disease 01/31/2010   CAD S/P percutaneous coronary angioplasty 01/03/2009   Dyslipidemia, goal LDL below 70 10/22/2007   Essential hypertension 10/22/2007    Claris Pong, PT 04/03/2021, 2:51 PM  [facility] @ [place], [place], [address] Phone: [phone]   Fax:  [phone]  Name: Nancy Liu MRN: [id number] Date of Birth: [birth year]

## 2021-04-08 DIAGNOSIS — Z9011 Acquired absence of right breast and nipple: Secondary | ICD-10-CM | POA: Diagnosis not present

## 2021-04-09 DIAGNOSIS — Z9011 Acquired absence of right breast and nipple: Secondary | ICD-10-CM | POA: Diagnosis not present

## 2021-04-10 ENCOUNTER — Ambulatory Visit: Payer: PPO

## 2021-04-10 ENCOUNTER — Other Ambulatory Visit: Payer: Self-pay

## 2021-04-10 DIAGNOSIS — R293 Abnormal posture: Secondary | ICD-10-CM

## 2021-04-10 DIAGNOSIS — D0511 Intraductal carcinoma in situ of right breast: Secondary | ICD-10-CM | POA: Diagnosis not present

## 2021-04-10 DIAGNOSIS — Z483 Aftercare following surgery for neoplasm: Secondary | ICD-10-CM

## 2021-04-10 DIAGNOSIS — R600 Localized edema: Secondary | ICD-10-CM

## 2021-04-10 NOTE — Patient Instructions (Signed)
Self manual lymph drainage:       Cancer Rehab [phone] Perform this sequence once a day.  Only give enough pressure to your skin to make the skin move.  Hug yourself.  Do circles at your neck just above your collarbones.  Repeat this 10 times.  Diaphragmatic - Supine   Inhale through nose making navel move out toward hands. Exhale through puckered lips, hands follow navel in. Repeat _5__ times. Rest _10__ seconds between repeats.  Axilla - One at a Time   Using full weight of flat hand and fingers at center of uninvolved armpit, make _10__ in-place circles.   Copyright  VHI. All rights reserved.  LEG: Inguinal Nodes Stimulation   With small finger side of hand against hip crease on involved side, gently perform circles at the crease. Repeat __10_ times.   Copyright  VHI. All rights reserved.  Axilla to Inguinal Nodes - Sweep   On involved side, sweep _4__ times from armpit along side of trunk to hip crease.  Now gently stretch skin from the involved side to the uninvolved side across the chest at the shoulder line.  Repeat that 4 times. Now focus on area of swelling for as long as you want (most is at side of your trunk and it's easier to reach if you lay on your left side)   Finish by doing the pathways as described above going from your involved armpit to the same side groin and going across your upper chest from the involved shoulder to the uninvolved shoulder.  Repeat the steps above where you do circles in your right groin and left armpit.   CHEST: Doorway, Bilateral - Standing    Standing in doorway, place hands on wall with elbows bent at shoulder height. Lean forward. Hold _20__ seconds. _2-3__ reps  __3-4_ sets per day  Can also try sliding left arm up doorframe and gently leaning forward, keeping shoulder down, until gentle stretch felt.  Hold 5-10 sec and repeat 3-5 times.

## 2021-04-10 NOTE — Therapy (Addendum)
[facility] @ [place], [place], [address] Phone: [phone]   Fax:  [phone]  Physical Therapy Treatment  Patient Details  Name: Nancy Liu MRN: [id number] Date of Birth: [birth year] Referring Provider (PT): Dr. Burr Medico   Encounter Date: 04/10/2021   PT End of Session - 04/10/21 1007     Visit Number 2    Number of Visits 7    PT Start Time 0906    PT Stop Time 1006    PT Time Calculation (min) 60 min    Activity Tolerance Patient tolerated treatment well    Behavior During Therapy [facility] for tasks assessed/performed             Past Medical History:  Diagnosis Date   Arteriosclerotic cardiovascular disease (ASCVD) 2001   Bare-metal stent placed in the right coronary artery in 12/01; residual 50% lesion of the first diagonal and mid LAD   Breast cancer ([place])    Cerebrovascular disease    COPD (chronic obstructive pulmonary disease) ([place])    Cyst, dermoid, arm, left 03/26/2018   Diabetes mellitus    excellent control with a low-dose of a single oral agent   Family history of breast cancer 01/23/2021   Family history of ovarian cancer 01/23/2021   GERD (gastroesophageal reflux disease)    with ulcers   History of right coronary artery stent placement 2000   Hyperlipidemia    Hypertension    Sleep apnea    Thalassemia minor    Tobacco abuse, in remission    35 pack years; Quit in 1980    Past Surgical History:  Procedure Laterality Date   BIOPSY  02/28/2020   Procedure: BIOPSY;  Surgeon: Eloise Harman, DO;  [facility];  Service: Endoscopy;;   COLONOSCOPY  2006   Dr. Collene Mares: normal   COLONOSCOPY  2000   Dr. Everlean Cherry: normal    COLONOSCOPY N/A 03/03/2016   Dr. Oneida Alar: diverticulosis, non-bleeding hemorrhoids, redundant left colon.   DILATION AND CURETTAGE OF UTERUS     ESOPHAGOGASTRODUODENOSCOPY  2000   Dr. Everlean Cherry: gastritis    ESOPHAGOGASTRODUODENOSCOPY  2006   Dr. Collene Mares:  small hiatal hernia, gastritis, negative H.pylori    ESOPHAGOGASTRODUODENOSCOPY N/A 03/03/2016   Procedure: ESOPHAGOGASTRODUODENOSCOPY (EGD);  Surgeon: Danie Binder, MD;  [facility];  Service: Endoscopy;  Laterality: N/A;   ESOPHAGOGASTRODUODENOSCOPY N/A 06/03/2016   Dr. Oneida Alar: nonaggressive gastritis due to aspirin use. Previous ulcers had healed.   ESOPHAGOGASTRODUODENOSCOPY (EGD) WITH PROPOFOL N/A 02/28/2020   focal inflammation in the gastric antrum consistent with gastritis on biopsies. No H.pylori.    EXCISION OF KELOID Left 03/26/2018   Procedure: EXCISION OF LEFT ARM MASS;  Surgeon: Fanny Skates, MD;  Location: [place];  Service: General;  Laterality: Left;   GIVENS CAPSULE STUDY N/A 10/10/2020   Procedure: GIVENS CAPSULE STUDY;  Surgeon: Eloise Harman, DO;  [facility];  Service: Endoscopy;  Laterality: N/A;  7:30am   INGUINAL HERNIA REPAIR  1970s   Right   TOTAL MASTECTOMY Right 02/20/2021   Procedure: RIGHT TOTAL MASTECTOMY;  Surgeon: Rolm Bookbinder, MD;  Location: [place];  Service: General;  Laterality: Right;   VAGINAL HYSTERECTOMY  1980   Unilateral oophorectomy    There were no vitals filed for this visit.   Subjective Assessment - 04/10/21 0911     Subjective I don't have much pain just tightness across my chest and want to  get that fullness at my side down. I got some new bras Monday. Some were cute and 2 were compression bras. I have trouble getting the zipper up.    Pertinent History pt had right mastectomy 02/20/21 for DCIS with atypical lobular hyperplasia ER, PR+.  She does not require radiation or anti-estrogens.NIDDM, CAD, COPD, Heart valve problems    Currently in Pain? No/denies                               [facility] Adult PT Treatment/Exercise - 04/10/21 0001       Exercises   Exercises Other Exercises    Other Exercises  Briefly showed pt Rt shoulder stretch in doorway and issued this to  handout, also pectoralis stretch but advised her to hold off on this until incision is closed.      Manual Therapy   Manual Therapy Manual Lymphatic Drainage (MLD);Myofascial release;Passive ROM    Myofascial Release To Rt chest during P/ROM    Manual Lymphatic Drainage (MLD) In Supine: Short neck, 5 diaphragmatic breaths, Lt axillary and Rt inguinal nodes and Rt anterior chest and lateral trunk, then into Lt S/L for focus on lateral trunk redirecting towards Rt axillo-inguinal anastomosis, then finished retracing all steps in supine and ended with Lt axillary and Rt inguinal lymph nodes and initiated instructing pt in same and having her return demo    Passive ROM In Supine to Rt shoulder into flexion and abduction                     PT Education - 04/10/21 1006     Education Details Self manual lymph drainage and Lt UE AA/ROM    Person(s) Educated Patient    Methods Explanation;Demonstration;Handout    Comprehension Verbalized understanding;Returned demonstration;Need further instruction                 PT Long Term Goals - 04/03/21 1442       PT LONG TERM GOAL #1   Title pt will be independent in self MLD to right axillary/chest region    Time 6    Period Weeks    Status New    Target Date 05/15/21      PT LONG TERM GOAL #2   Title Pt will report swelling improved by atleast 50%    Time 6    Period Weeks    Status New    Target Date 05/15/21      PT LONG TERM GOAL #3   Title pt will be independent in home strengthening program    Time 6    Period Weeks    Status New    Target Date 05/15/21                   Plan - 04/10/21 1008     Clinical Impression Statement First session of treatment of manual lymph drainage and gentle stretching of Rt shoulder with MFR to chest wall. Instructed pt in basics of anatomy of lymphatic system and principles of MLD. She was able to return reasonable demonstration after VCs and hand over hand pressure but  she will benefit from further review of this. She does have some increased tightness at her Rt chest wall which limits her end P/ROM of Rt shoulder so issued HEP to address this in addition to the self MLD handout. Some softening was noted by therapist at Rt lateral trunk by  end of session. Pt did get 2 compression bras Monday but did not bring today as she was having trouble getting zipper to close so advised her to try this again at home and if zipper seems broke to return to store and she verbalized understanding and reports will bring these to next session for [place] to assess fit. Also, she was wearing a bandaid over inferior aspect of incision that hasn't fully closed and she reports keeping this on her always with neosporin. So advised her to have some time when home with just a tshirt on and no bandaid so area has to time to dry out allowing for a less macerated wound environment. Pt verbalized good understanding of all today.    Personal Factors and Comorbidities Comorbidity 3+    Comorbidities Right breast CA, COPD, CAD, heart valve problems    Stability/Clinical Decision Making Stable/Uncomplicated    Rehab Potential Good    PT Frequency 1x / week    PT Duration 6 weeks    PT Treatment/Interventions ADLs/Self Care Home Management;Therapeutic exercise;Manual techniques;Patient/family education;Manual lymph drainage;Scar mobilization;Passive range of motion    PT Next Visit Plan Pt with concerns about cost;release when independent. Cont and review MLD to right trunk area of swelling and instruct pt, check bras when she gets, teach UE strength    PT Home Exercise Plan pt is perofrming 4 post op exercises    Consulted and Agree with Plan of Care Patient             Patient will benefit from skilled therapeutic intervention in order to improve the following deficits and impairments:  Decreased knowledge of precautions, Decreased skin integrity, Decreased scar mobility, Decreased strength,  Increased edema, Postural dysfunction  Visit Diagnosis: Intraductal carcinoma in situ of right breast  Abnormal posture  Aftercare following surgery for neoplasm  Localized edema     Problem List Patient Active Problem List   Diagnosis Date Noted   GERD (gastroesophageal reflux disease) 03/05/2021   S/P mastectomy, right 02/20/2021   Genetic testing 02/12/2021   Family history of breast cancer 01/23/2021   Family history of ovarian cancer 01/23/2021   Ductal carcinoma in situ (DCIS) of right breast 01/17/2021   Black stool 08/24/2020   Non-insulin dependent type 2 diabetes mellitus ([place]) 03/26/2020   Loss of weight 07/13/2019   Cyst, dermoid, arm, left 03/26/2018   Diarrhea 08/10/2017   Constipation 02/10/2017   AVM (arteriovenous malformation) of small bowel, acquired 02/10/2017   Iron deficiency anemia due to chronic blood loss 07/23/2016   Gastritis and gastroduodenitis    Symptomatic anemia 02/29/2016   Weight loss 04/01/2012   Chest pain on exertion 02/07/2011   Microcytic anemia 01/31/2010   Cerebrovascular disease 01/31/2010   CAD S/P percutaneous coronary angioplasty 01/03/2009   Dyslipidemia, goal LDL below 70 10/22/2007   Essential hypertension 10/22/2007    Otelia Limes, PTA 04/10/2021, 12:16 PM  [facility] @ [place] [address], [place], [address] Phone: [phone]   Fax:  [phone]  Name: Nancy Liu MRN: [id number] Date of Birth: [birth year]

## 2021-04-16 DIAGNOSIS — E1169 Type 2 diabetes mellitus with other specified complication: Secondary | ICD-10-CM | POA: Diagnosis not present

## 2021-04-16 DIAGNOSIS — I10 Essential (primary) hypertension: Secondary | ICD-10-CM | POA: Diagnosis not present

## 2021-04-16 DIAGNOSIS — Z9011 Acquired absence of right breast and nipple: Secondary | ICD-10-CM | POA: Diagnosis not present

## 2021-04-17 ENCOUNTER — Other Ambulatory Visit: Payer: Self-pay

## 2021-04-17 ENCOUNTER — Ambulatory Visit: Payer: PPO | Admitting: Rehabilitation

## 2021-04-17 ENCOUNTER — Encounter: Payer: Self-pay | Admitting: Rehabilitation

## 2021-04-17 DIAGNOSIS — D0511 Intraductal carcinoma in situ of right breast: Secondary | ICD-10-CM | POA: Diagnosis not present

## 2021-04-17 DIAGNOSIS — Z483 Aftercare following surgery for neoplasm: Secondary | ICD-10-CM

## 2021-04-17 DIAGNOSIS — R293 Abnormal posture: Secondary | ICD-10-CM

## 2021-04-17 DIAGNOSIS — R600 Localized edema: Secondary | ICD-10-CM

## 2021-04-17 NOTE — Therapy (Signed)
[facility] @ [place], [place], [address] Phone: [phone]   Fax:  [phone]  Physical Therapy Treatment  Patient Details  Name: Nancy Liu MRN: [id number] Date of Birth: 1940-12-01 Referring Provider (PT): Dr. Burr Medico   Encounter Date: 04/17/2021   PT End of Session - 04/17/21 0944     Visit Number 3    Number of Visits 7    Date for PT Re-Evaluation 05/15/21    PT Start Time 0856    PT Stop Time 0945    PT Time Calculation (min) 49 min    Activity Tolerance Patient tolerated treatment well    Behavior During Therapy [facility] for tasks assessed/performed             Past Medical History:  Diagnosis Date   Arteriosclerotic cardiovascular disease (ASCVD) 2001   Bare-metal stent placed in the right coronary artery in 12/01; residual 50% lesion of the first diagonal and mid LAD   Breast cancer [facility])    Cerebrovascular disease    COPD (chronic obstructive pulmonary disease) ([place])    Cyst, dermoid, arm, left 03/26/2018   Diabetes mellitus    excellent control with a low-dose of a single oral agent   Family history of breast cancer 01/23/2021   Family history of ovarian cancer 01/23/2021   GERD (gastroesophageal reflux disease)    with ulcers   History of right coronary artery stent placement 2000   Hyperlipidemia    Hypertension    Sleep apnea    Thalassemia minor    Tobacco abuse, in remission    35 pack years; Quit in 1980    Past Surgical History:  Procedure Laterality Date   BIOPSY  02/28/2020   Procedure: BIOPSY;  Surgeon: Eloise Harman, DO;  [facility];  Service: Endoscopy;;   COLONOSCOPY  2006   Dr. Collene Mares: normal   COLONOSCOPY  2000   Dr. Everlean Cherry: normal    COLONOSCOPY N/A 03/03/2016   Dr. Oneida Alar: diverticulosis, non-bleeding hemorrhoids, redundant left colon.   DILATION AND CURETTAGE OF UTERUS     ESOPHAGOGASTRODUODENOSCOPY  2000   Dr. Everlean Cherry: gastritis     ESOPHAGOGASTRODUODENOSCOPY  2006   Dr. Collene Mares: small hiatal hernia, gastritis, negative H.pylori    ESOPHAGOGASTRODUODENOSCOPY N/A 03/03/2016   Procedure: ESOPHAGOGASTRODUODENOSCOPY (EGD);  Surgeon: Danie Binder, MD;  [facility];  Service: Endoscopy;  Laterality: N/A;   ESOPHAGOGASTRODUODENOSCOPY N/A 06/03/2016   Dr. Oneida Alar: nonaggressive gastritis due to aspirin use. Previous ulcers had healed.   ESOPHAGOGASTRODUODENOSCOPY (EGD) WITH PROPOFOL N/A 02/28/2020   focal inflammation in the gastric antrum consistent with gastritis on biopsies. No H.pylori.    EXCISION OF KELOID Left 03/26/2018   Procedure: EXCISION OF LEFT ARM MASS;  Surgeon: Fanny Skates, MD;  Location: [place];  Service: General;  Laterality: Left;   GIVENS CAPSULE STUDY N/A 10/10/2020   Procedure: GIVENS CAPSULE STUDY;  Surgeon: Eloise Harman, DO;  [facility];  Service: Endoscopy;  Laterality: N/A;  7:30am   INGUINAL HERNIA REPAIR  1970s   Right   TOTAL MASTECTOMY Right 02/20/2021   Procedure: RIGHT TOTAL MASTECTOMY;  Surgeon: Rolm Bookbinder, MD;  Location: [place];  Service: General;  Laterality: Right;   VAGINAL HYSTERECTOMY  1980   Unilateral oophorectomy    There were no vitals filed for this visit.   Subjective Assessment - 04/17/21 0852     Subjective Nothing new.  It felt  a little better but not alot better after.  I have my compression bra on.  The MD said the open spot looked okay.  My daughter is doing the massage.    Pertinent History pt had right mastectomy 02/20/21 for DCIS with atypical lobular hyperplasia ER, PR+.  She does not require radiation or anti-estrogens.NIDDM, CAD, COPD, Heart valve problems    Currently in Pain? No/denies                               [facility] Adult PT Treatment/Exercise - 04/17/21 0001       Manual Therapy   Manual Therapy Soft tissue mobilization    Soft tissue mobilization to the pectoralis in supine     Manual Lymphatic Drainage (MLD) In Supine: Short neck, 5 diaphragmatic breaths, bil axillary and Rt inguinal nodes and Rt anterior chest and lateral trunk, then into Lt S/L for focus on lateral trunk redirecting towards Rt axillo-inguinal anastomosis, then finished retracing all steps in supine and ended with Lt axillary and Rt inguinal lymph nodes reviewing with patient throughout    Passive ROM In Supine to Rt shoulder into flexion and abduction limited to 90deg today due to pulling on open T junction spot.                          PT Long Term Goals - 04/03/21 1442       PT LONG TERM GOAL #1   Title pt will be independent in self MLD to right axillary/chest region    Time 6    Period Weeks    Status New    Target Date 05/15/21      PT LONG TERM GOAL #2   Title Pt will report swelling improved by atleast 50%    Time 6    Period Weeks    Status New    Target Date 05/15/21      PT LONG TERM GOAL #3   Title pt will be independent in home strengthening program    Time 6    Period Weeks    Status New    Target Date 05/15/21                   Plan - 04/17/21 0945     Clinical Impression Statement Continued MLD work to the Rt chest and lateral trunk continuing review with the patient.  T junction open spot is not covered today and PT noted pulling with overhead movements past 90deg so pt will avoid stretches at least until Monday to attempt to heal.  Pt now has compression bra which fits well with reminder to keep extra skin in axilla tucked in as much as possible.  Pt will be OOT due to holiday so we will see her in 2 weeks.    PT Frequency 1x / week    PT Duration 6 weeks    PT Treatment/Interventions ADLs/Self Care Home Management;Therapeutic exercise;Manual techniques;Patient/family education;Manual lymph drainage;Scar mobilization;Passive range of motion    PT Next Visit Plan Pt with concerns about cost;release when independent. Cont and review MLD to  right trunk area of swelling and instruct pt, restart PROM/AAROM as healed    Consulted and Agree with Plan of Care Patient             Patient will benefit from skilled therapeutic intervention in order to improve the following deficits and  impairments:     Visit Diagnosis: Intraductal carcinoma in situ of right breast  Abnormal posture  Aftercare following surgery for neoplasm  Localized edema     Problem List Patient Active Problem List   Diagnosis Date Noted   GERD (gastroesophageal reflux disease) 03/05/2021   S/P mastectomy, right 02/20/2021   Genetic testing 02/12/2021   Family history of breast cancer 01/23/2021   Family history of ovarian cancer 01/23/2021   Ductal carcinoma in situ (DCIS) of right breast 01/17/2021   Black stool 08/24/2020   Non-insulin dependent type 2 diabetes mellitus ([place]) 03/26/2020   Loss of weight 07/13/2019   Cyst, dermoid, arm, left 03/26/2018   Diarrhea 08/10/2017   Constipation 02/10/2017   AVM (arteriovenous malformation) of small bowel, acquired 02/10/2017   Iron deficiency anemia due to chronic blood loss 07/23/2016   Gastritis and gastroduodenitis    Symptomatic anemia 02/29/2016   Weight loss 04/01/2012   Chest pain on exertion 02/07/2011   Microcytic anemia 01/31/2010   Cerebrovascular disease 01/31/2010   CAD S/P percutaneous coronary angioplasty 01/03/2009   Dyslipidemia, goal LDL below 70 10/22/2007   Essential hypertension 10/22/2007    Stark Bray, PT 04/17/2021, 9:50 AM  [facility] @ [place], [place], [address] Phone: [phone]   Fax:  [phone]  Name: [name] BOLTE MRN: [id number] Date of Birth: August 27, 1940

## 2021-04-24 ENCOUNTER — Encounter: Payer: PPO | Admitting: Rehabilitation

## 2021-04-30 DIAGNOSIS — Z9011 Acquired absence of right breast and nipple: Secondary | ICD-10-CM | POA: Diagnosis not present

## 2021-05-01 ENCOUNTER — Other Ambulatory Visit: Payer: Self-pay

## 2021-05-01 ENCOUNTER — Encounter: Payer: Self-pay | Admitting: Rehabilitation

## 2021-05-01 ENCOUNTER — Ambulatory Visit: Payer: PPO | Admitting: Rehabilitation

## 2021-05-01 DIAGNOSIS — D0511 Intraductal carcinoma in situ of right breast: Secondary | ICD-10-CM | POA: Diagnosis not present

## 2021-05-01 DIAGNOSIS — Z9011 Acquired absence of right breast and nipple: Secondary | ICD-10-CM

## 2021-05-01 DIAGNOSIS — M25611 Stiffness of right shoulder, not elsewhere classified: Secondary | ICD-10-CM

## 2021-05-01 NOTE — Therapy (Signed)
[facility] @ [address] [place], [place], [address] Phone: [phone]   Fax:  [phone]  Physical Therapy Treatment  Patient Details  Name: Nancy Liu MRN: [id number] Date of Birth: 1940-12-01 Referring Provider (PT): Dr. Burr Medico   Encounter Date: 05/01/2021   PT End of Session - 05/01/21 0947     Visit Number 4    Number of Visits 7    Date for PT Re-Evaluation 05/15/21    PT Start Time 0903    PT Stop Time 0947    PT Time Calculation (min) 44 min    Activity Tolerance Patient tolerated treatment well    Behavior During Therapy [facility] for tasks assessed/performed             Past Medical History:  Diagnosis Date   Arteriosclerotic cardiovascular disease (ASCVD) 2001   Bare-metal stent placed in the right coronary artery in 12/01; residual 50% lesion of the first diagonal and mid LAD   Breast cancer [facility])    Cerebrovascular disease    COPD (chronic obstructive pulmonary disease) ([place])    Cyst, dermoid, arm, left 03/26/2018   Diabetes mellitus    excellent control with a low-dose of a single oral agent   Family history of breast cancer 01/23/2021   Family history of ovarian cancer 01/23/2021   GERD (gastroesophageal reflux disease)    with ulcers   History of right coronary artery stent placement 2000   Hyperlipidemia    Hypertension    Sleep apnea    Thalassemia minor    Tobacco abuse, in remission    35 pack years; Quit in 1980    Past Surgical History:  Procedure Laterality Date   BIOPSY  02/28/2020   Procedure: BIOPSY;  Surgeon: Eloise Harman, DO;  [facility];  Service: Endoscopy;;   COLONOSCOPY  2006   Dr. Collene Mares: normal   COLONOSCOPY  2000   Dr. Everlean Cherry: normal    COLONOSCOPY N/A 03/03/2016   Dr. Oneida Alar: diverticulosis, non-bleeding hemorrhoids, redundant left colon.   DILATION AND CURETTAGE OF UTERUS     ESOPHAGOGASTRODUODENOSCOPY  2000   Dr. Everlean Cherry: gastritis     ESOPHAGOGASTRODUODENOSCOPY  2006   Dr. Collene Mares: small hiatal hernia, gastritis, negative H.pylori    ESOPHAGOGASTRODUODENOSCOPY N/A 03/03/2016   Procedure: ESOPHAGOGASTRODUODENOSCOPY (EGD);  Surgeon: Danie Binder, MD;  [facility];  Service: Endoscopy;  Laterality: N/A;   ESOPHAGOGASTRODUODENOSCOPY N/A 06/03/2016   Dr. Oneida Alar: nonaggressive gastritis due to aspirin use. Previous ulcers had healed.   ESOPHAGOGASTRODUODENOSCOPY (EGD) WITH PROPOFOL N/A 02/28/2020   focal inflammation in the gastric antrum consistent with gastritis on biopsies. No H.pylori.    EXCISION OF KELOID Left 03/26/2018   Procedure: EXCISION OF LEFT ARM MASS;  Surgeon: Fanny Skates, MD;  Location: [facility];  Service: General;  Laterality: Left;   GIVENS CAPSULE STUDY N/A 10/10/2020   Procedure: GIVENS CAPSULE STUDY;  Surgeon: Eloise Harman, DO;  [facility];  Service: Endoscopy;  Laterality: N/A;  7:30am   INGUINAL HERNIA REPAIR  1970s   Right   TOTAL MASTECTOMY Right 02/20/2021   Procedure: RIGHT TOTAL MASTECTOMY;  Surgeon: Rolm Bookbinder, MD;  Location: [facility];  Service: General;  Laterality: Right;   VAGINAL HYSTERECTOMY  1980   Unilateral oophorectomy    There were no vitals filed for this visit.   Subjective Assessment - 05/01/21 0904     Subjective I fell this weekend about  6 days ago.  I am just sore on the left ribs and a bit bruised.    Pertinent History pt had right mastectomy 02/20/21 for DCIS with atypical lobular hyperplasia ER, PR+.  She does not require radiation or anti-estrogens.NIDDM, CAD, COPD, Heart valve problems    Currently in Pain? No/denies   only with coughing and breathing                              [facility] Adult PT Treatment/Exercise - 05/01/21 0001       Exercises   Exercises Shoulder      Shoulder Exercises: Pulleys   Flexion 2 minutes    Flexion Limitations no left pain noted - vcs for initial instruction       Manual Therapy   Soft tissue mobilization to the pectoralis in supine and around scar tissue re-educating patient on self massage now that the incision is healed    Manual Lymphatic Drainage (MLD) In Supine: Short neck, 5 diaphragmatic breaths, bil axillary and Rt inguinal nodes and Rt anterior chest and lateral trunk, then into Lt S/L for focus on lateral trunk redirecting towards Rt axillo-inguinal anastomosis, then finished retracing all steps in supine and ended with Lt axillary and Rt inguinal lymph nodes reviewing with patient throughout    Passive ROM In Supine to Rt shoulder into flexion and abduction                          PT Long Term Goals - 04/03/21 1442       PT LONG TERM GOAL #1   Title pt will be independent in self MLD to right axillary/chest region    Time 6    Period Weeks    Status New    Target Date 05/15/21      PT LONG TERM GOAL #2   Title Pt will report swelling improved by atleast 50%    Time 6    Period Weeks    Status New    Target Date 05/15/21      PT LONG TERM GOAL #3   Title pt will be independent in home strengthening program    Time 6    Period Weeks    Status New    Target Date 05/15/21                   Plan - 05/01/21 0947     Clinical Impression Statement Pt had a fall hurting the ribs on the left side which are bruised and due to pain with deep breathing and coughing pt was instructed to at least call her PCP to ask for instruction although they may not have her do anything.  Incision is now fully healed and pt is doing very well overall.  Will check ROM next visit and focus on self MLD instruction and AAROm now that incision is healed.    PT Frequency 1x / week    PT Duration 6 weeks    PT Treatment/Interventions ADLs/Self Care Home Management;Therapeutic exercise;Manual techniques;Patient/family education;Manual lymph drainage;Scar mobilization;Passive range of motion    PT Next Visit Plan check AROM, AAROM  update HEP as needed, review self MLD with patient as her daughter used to be doing this but is now unable due to work schedule change.    Consulted and Agree with Plan of Care Patient  Patient will benefit from skilled therapeutic intervention in order to improve the following deficits and impairments:     Visit Diagnosis: S/P mastectomy, right  Stiffness of right shoulder, not elsewhere classified     Problem List Patient Active Problem List   Diagnosis Date Noted   GERD (gastroesophageal reflux disease) 03/05/2021   S/P mastectomy, right 02/20/2021   Genetic testing 02/12/2021   Family history of breast cancer 01/23/2021   Family history of ovarian cancer 01/23/2021   Ductal carcinoma in situ (DCIS) of right breast 01/17/2021   Black stool 08/24/2020   Non-insulin dependent type 2 diabetes mellitus ([place]) 03/26/2020   Loss of weight 07/13/2019   Cyst, dermoid, arm, left 03/26/2018   Diarrhea 08/10/2017   Constipation 02/10/2017   AVM (arteriovenous malformation) of small bowel, acquired 02/10/2017   Iron deficiency anemia due to chronic blood loss 07/23/2016   Gastritis and gastroduodenitis    Symptomatic anemia 02/29/2016   Weight loss 04/01/2012   Chest pain on exertion 02/07/2011   Microcytic anemia 01/31/2010   Cerebrovascular disease 01/31/2010   CAD S/P percutaneous coronary angioplasty 01/03/2009   Dyslipidemia, goal LDL below 70 10/22/2007   Essential hypertension 10/22/2007    Stark Bray, PT 05/01/2021, 9:52 AM  [facility] @ [place] [place], [place], [address] Phone: [phone]   Fax:  [phone]  Name: Nancy Liu MRN: [id number] Date of Birth: Nov 09, 1940

## 2021-05-03 DIAGNOSIS — H9190 Unspecified hearing loss, unspecified ear: Secondary | ICD-10-CM | POA: Diagnosis not present

## 2021-05-03 DIAGNOSIS — W010XXA Fall on same level from slipping, tripping and stumbling without subsequent striking against object, initial encounter: Secondary | ICD-10-CM | POA: Diagnosis not present

## 2021-05-06 ENCOUNTER — Ambulatory Visit (HOSPITAL_COMMUNITY)
Admission: RE | Admit: 2021-05-06 | Discharge: 2021-05-06 | Disposition: A | Discharge status: Home / Self Care | Payer: PPO | Source: Ambulatory Visit | Attending: Family Medicine | Admitting: Family Medicine

## 2021-05-06 ENCOUNTER — Other Ambulatory Visit: Payer: Self-pay

## 2021-05-06 ENCOUNTER — Other Ambulatory Visit (HOSPITAL_COMMUNITY): Payer: Self-pay | Admitting: Family Medicine

## 2021-05-06 DIAGNOSIS — S298XXA Other specified injuries of thorax, initial encounter: Secondary | ICD-10-CM | POA: Diagnosis not present

## 2021-05-06 DIAGNOSIS — R2681 Unsteadiness on feet: Secondary | ICD-10-CM | POA: Insufficient documentation

## 2021-05-06 DIAGNOSIS — Z043 Encounter for examination and observation following other accident: Secondary | ICD-10-CM | POA: Diagnosis not present

## 2021-05-06 DIAGNOSIS — W010XXA Fall on same level from slipping, tripping and stumbling without subsequent striking against object, initial encounter: Secondary | ICD-10-CM

## 2021-05-06 DIAGNOSIS — S4992XA Unspecified injury of left shoulder and upper arm, initial encounter: Secondary | ICD-10-CM | POA: Diagnosis not present

## 2021-05-06 DIAGNOSIS — W19XXXA Unspecified fall, initial encounter: Secondary | ICD-10-CM | POA: Diagnosis not present

## 2021-05-06 DIAGNOSIS — C50919 Malignant neoplasm of unspecified site of unspecified female breast: Secondary | ICD-10-CM | POA: Insufficient documentation

## 2021-05-07 ENCOUNTER — Other Ambulatory Visit: Payer: Self-pay | Admitting: Internal Medicine

## 2021-05-08 ENCOUNTER — Other Ambulatory Visit: Payer: Self-pay

## 2021-05-08 ENCOUNTER — Ambulatory Visit: Payer: PPO | Attending: Hematology

## 2021-05-08 DIAGNOSIS — R293 Abnormal posture: Secondary | ICD-10-CM | POA: Diagnosis not present

## 2021-05-08 DIAGNOSIS — M25611 Stiffness of right shoulder, not elsewhere classified: Secondary | ICD-10-CM | POA: Insufficient documentation

## 2021-05-08 DIAGNOSIS — Z483 Aftercare following surgery for neoplasm: Secondary | ICD-10-CM | POA: Insufficient documentation

## 2021-05-08 DIAGNOSIS — D0511 Intraductal carcinoma in situ of right breast: Secondary | ICD-10-CM | POA: Diagnosis not present

## 2021-05-08 DIAGNOSIS — R600 Localized edema: Secondary | ICD-10-CM | POA: Insufficient documentation

## 2021-05-08 DIAGNOSIS — Z9011 Acquired absence of right breast and nipple: Secondary | ICD-10-CM | POA: Insufficient documentation

## 2021-05-08 NOTE — Therapy (Signed)
[facility] @ [address] [place], [place], [address] Phone: [phone]   Fax:  [phone]  Physical Therapy Treatment  Patient Details  Name: Nancy Liu MRN: [id number] Date of Birth: July 21, 1940 Referring Provider (PT): Dr. Burr Medico   Encounter Date: 05/08/2021   PT End of Session - 05/08/21 1002     Visit Number 5    Number of Visits 7    Date for PT Re-Evaluation 05/15/21    PT Start Time 0905    PT Stop Time 8341    PT Time Calculation (min) 57 min    Activity Tolerance Patient tolerated treatment well    Behavior During Therapy [facility] for tasks assessed/performed             Past Medical History:  Diagnosis Date   Arteriosclerotic cardiovascular disease (ASCVD) 2001   Bare-metal stent placed in the right coronary artery in 12/01; residual 50% lesion of the first diagonal and mid LAD   Breast cancer [facility])    Cerebrovascular disease    COPD (chronic obstructive pulmonary disease) ([place])    Cyst, dermoid, arm, left 03/26/2018   Diabetes mellitus    excellent control with a low-dose of a single oral agent   Family history of breast cancer 01/23/2021   Family history of ovarian cancer 01/23/2021   GERD (gastroesophageal reflux disease)    with ulcers   History of right coronary artery stent placement 2000   Hyperlipidemia    Hypertension    Sleep apnea    Thalassemia minor    Tobacco abuse, in remission    35 pack years; Quit in 1980    Past Surgical History:  Procedure Laterality Date   BIOPSY  02/28/2020   Procedure: BIOPSY;  Surgeon: Eloise Harman, DO;  [facility];  Service: Endoscopy;;   COLONOSCOPY  2006   Dr. Collene Mares: normal   COLONOSCOPY  2000   Dr. Everlean Cherry: normal    COLONOSCOPY N/A 03/03/2016   Dr. Oneida Alar: diverticulosis, non-bleeding hemorrhoids, redundant left colon.   DILATION AND CURETTAGE OF UTERUS     ESOPHAGOGASTRODUODENOSCOPY  2000   Dr. Everlean Cherry: gastritis     ESOPHAGOGASTRODUODENOSCOPY  2006   Dr. Collene Mares: small hiatal hernia, gastritis, negative H.pylori    ESOPHAGOGASTRODUODENOSCOPY N/A 03/03/2016   Procedure: ESOPHAGOGASTRODUODENOSCOPY (EGD);  Surgeon: Danie Binder, MD;  [facility];  Service: Endoscopy;  Laterality: N/A;   ESOPHAGOGASTRODUODENOSCOPY N/A 06/03/2016   Dr. Oneida Alar: nonaggressive gastritis due to aspirin use. Previous ulcers had healed.   ESOPHAGOGASTRODUODENOSCOPY (EGD) WITH PROPOFOL N/A 02/28/2020   focal inflammation in the gastric antrum consistent with gastritis on biopsies. No H.pylori.    EXCISION OF KELOID Left 03/26/2018   Procedure: EXCISION OF LEFT ARM MASS;  Surgeon: Fanny Skates, MD;  Location: [facility];  Service: General;  Laterality: Left;   GIVENS CAPSULE STUDY N/A 10/10/2020   Procedure: GIVENS CAPSULE STUDY;  Surgeon: Eloise Harman, DO;  [facility];  Service: Endoscopy;  Laterality: N/A;  7:30am   INGUINAL HERNIA REPAIR  1970s   Right   TOTAL MASTECTOMY Right 02/20/2021   Procedure: RIGHT TOTAL MASTECTOMY;  Surgeon: Rolm Bookbinder, MD;  Location: [facility];  Service: General;  Laterality: Right;   VAGINAL HYSTERECTOMY  1980   Unilateral oophorectomy    There were no vitals filed for this visit.   Subjective Assessment - 05/08/21 0906     Subjective I had an xray since  I was here last and everything looks good. My rib pain is improving as well. My Rt shoulder is getting better, mostly just stiff now. I do need to review the self MLD as it is hard for me to do that on myself.    Pertinent History pt had right mastectomy 02/20/21 for DCIS with atypical lobular hyperplasia ER, PR+.  She does not require radiation or anti-estrogens.NIDDM, CAD, COPD, Heart valve problems    Currently in Pain? No/denies                               [facility] Adult PT Treatment/Exercise - 05/08/21 0001       Manual Therapy   Manual Therapy Soft tissue  mobilization;Myofascial release;Scapular mobilization;Manual Lymphatic Drainage (MLD);Passive ROM    Soft tissue mobilization to the Rt pectoralis in supine and around scar; then into Lt S/L with cocos butter to her medial scapular border where pt palpably very tight and trigger points palpable but these did soften some after STM    Myofascial Release To Rt axilla and along pectoralis tendon    Scapular Mobilization In Lt S/L for Rt scapula protraction and retraction; then scapular depression during P/ROM which helped to alleviate discomfort    Manual Lymphatic Drainage (MLD) In Supine: Short neck, 5 diaphragmatic breaths, Lt axillary and Rt inguinal nodes and Rt axillo-inguinal and anterior inter-axillary anastomsois, then focused on superior chest wall and lateral trunk redirecting towards anastomosis and reviewing verbally with pt throughout    Passive ROM In Supine to Rt shoulder into flexion abduction, and D2 to pts tolerance with VCs during to relax which pt was able to do better throughout session                          PT Long Term Goals - 04/03/21 1442       PT LONG TERM GOAL #1   Title pt will be independent in self MLD to right axillary/chest region    Time 6    Period Weeks    Status New    Target Date 05/15/21      PT LONG TERM GOAL #2   Title Pt will report swelling improved by atleast 50%    Time 6    Period Weeks    Status New    Target Date 05/15/21      PT LONG TERM GOAL #3   Title pt will be independent in home strengthening program    Time 6    Period Weeks    Status New    Target Date 05/15/21                   Plan - 05/08/21 1003     Clinical Impression Statement Pts pain from fall is much improved but her Rt shoulder still very stiff. So focused on manual therapy working to decrease to Rt upper quadrant tightness and pt report sthis feeling much improved by end of session today.    Personal Factors and Comorbidities Comorbidity  3+    Comorbidities Right breast CA, COPD, CAD, heart valve problems    Stability/Clinical Decision Making Stable/Uncomplicated    Rehab Potential Good    PT Frequency 1x / week    PT Duration 6 weeks    PT Treatment/Interventions ADLs/Self Care Home Management;Therapeutic exercise;Manual techniques;Patient/family education;Manual lymph drainage;Scar mobilization;Passive range of motion    PT Next  Visit Plan check AROM, AAROM update HEP as needed, review self MLD with patient as her daughter used to be doing this but is now unable due to work schedule change.    PT Home Exercise Plan pt is perofrming 4 post op exercises    Consulted and Agree with Plan of Care Patient             Patient will benefit from skilled therapeutic intervention in order to improve the following deficits and impairments:  Decreased knowledge of precautions, Decreased skin integrity, Decreased scar mobility, Decreased strength, Increased edema, Postural dysfunction  Visit Diagnosis: Intraductal carcinoma in situ of right breast  Abnormal posture  Aftercare following surgery for neoplasm  Localized edema     Problem List Patient Active Problem List   Diagnosis Date Noted   GERD (gastroesophageal reflux disease) 03/05/2021   S/P mastectomy, right 02/20/2021   Genetic testing 02/12/2021   Family history of breast cancer 01/23/2021   Family history of ovarian cancer 01/23/2021   Ductal carcinoma in situ (DCIS) of right breast 01/17/2021   Black stool 08/24/2020   Non-insulin dependent type 2 diabetes mellitus ([place]) 03/26/2020   Loss of weight 07/13/2019   Cyst, dermoid, arm, left 03/26/2018   Diarrhea 08/10/2017   Constipation 02/10/2017   AVM (arteriovenous malformation) of small bowel, acquired 02/10/2017   Iron deficiency anemia due to chronic blood loss 07/23/2016   Gastritis and gastroduodenitis    Symptomatic anemia 02/29/2016   Weight loss 04/01/2012   Chest pain on exertion 02/07/2011    Microcytic anemia 01/31/2010   Cerebrovascular disease 01/31/2010   CAD S/P percutaneous coronary angioplasty 01/03/2009   Dyslipidemia, goal LDL below 70 10/22/2007   Essential hypertension 10/22/2007    Otelia Limes, PTA 05/08/2021, 10:13 AM  [facility] @ [address] [place], [place], [address] Phone: [phone]   Fax:  [phone]  Name: Nancy Liu MRN: [id number] Date of Birth: 12-28-1940

## 2021-05-09 ENCOUNTER — Ambulatory Visit: Payer: PPO | Admitting: Cardiology

## 2021-05-13 ENCOUNTER — Inpatient Hospital Stay (HOSPITAL_COMMUNITY): Payer: PPO | Attending: Hematology

## 2021-05-13 DIAGNOSIS — Z9011 Acquired absence of right breast and nipple: Secondary | ICD-10-CM | POA: Diagnosis not present

## 2021-05-13 DIAGNOSIS — D509 Iron deficiency anemia, unspecified: Secondary | ICD-10-CM | POA: Insufficient documentation

## 2021-05-13 DIAGNOSIS — Z79899 Other long term (current) drug therapy: Secondary | ICD-10-CM | POA: Insufficient documentation

## 2021-05-13 DIAGNOSIS — Z87891 Personal history of nicotine dependence: Secondary | ICD-10-CM | POA: Diagnosis not present

## 2021-05-13 DIAGNOSIS — Z7982 Long term (current) use of aspirin: Secondary | ICD-10-CM | POA: Insufficient documentation

## 2021-05-13 DIAGNOSIS — Z7984 Long term (current) use of oral hypoglycemic drugs: Secondary | ICD-10-CM | POA: Diagnosis not present

## 2021-05-13 DIAGNOSIS — D500 Iron deficiency anemia secondary to blood loss (chronic): Secondary | ICD-10-CM

## 2021-05-13 DIAGNOSIS — D0511 Intraductal carcinoma in situ of right breast: Secondary | ICD-10-CM | POA: Diagnosis not present

## 2021-05-13 DIAGNOSIS — E559 Vitamin D deficiency, unspecified: Secondary | ICD-10-CM

## 2021-05-13 LAB — CBC WITH DIFFERENTIAL/PLATELET
Abs Immature Granulocytes: 0.03 10*3/uL (ref 0.00–0.07)
Basophils Absolute: 0.1 10*3/uL (ref 0.0–0.1)
Basophils Relative: 1 %
Eosinophils Absolute: 0.3 10*3/uL (ref 0.0–0.5)
Eosinophils Relative: 5 %
HCT: 39.9 % (ref 36.0–46.0)
Hemoglobin: 12.5 g/dL (ref 12.0–15.0)
Immature Granulocytes: 0 %
Lymphocytes Relative: 16 %
Lymphs Abs: 1.2 10*3/uL (ref 0.7–4.0)
MCH: 26 pg (ref 26.0–34.0)
MCHC: 31.3 g/dL (ref 30.0–36.0)
MCV: 83 fL (ref 80.0–100.0)
Monocytes Absolute: 0.6 10*3/uL (ref 0.1–1.0)
Monocytes Relative: 8 %
Neutro Abs: 5.1 10*3/uL (ref 1.7–7.7)
Neutrophils Relative %: 70 %
Platelets: 262 10*3/uL (ref 150–400)
RBC: 4.81 MIL/uL (ref 3.87–5.11)
RDW: 14.6 % (ref 11.5–15.5)
WBC: 7.2 10*3/uL (ref 4.0–10.5)
nRBC: 0 % (ref 0.0–0.2)

## 2021-05-13 LAB — IRON AND TIBC
Iron: 65 ug/dL (ref 28–170)
Saturation Ratios: 21 % (ref 10.4–31.8)
TIBC: 310 ug/dL (ref 250–450)
UIBC: 245 ug/dL

## 2021-05-13 LAB — FERRITIN: Ferritin: 134 ng/mL (ref 11–307)

## 2021-05-13 LAB — VITAMIN D 25 HYDROXY (VIT D DEFICIENCY, FRACTURES): Vit D, 25-Hydroxy: 133.41 ng/mL — ABNORMAL HIGH (ref 30–100)

## 2021-05-14 ENCOUNTER — Ambulatory Visit: Payer: PPO | Admitting: Rehabilitation

## 2021-05-14 ENCOUNTER — Encounter: Payer: Self-pay | Admitting: Rehabilitation

## 2021-05-14 ENCOUNTER — Other Ambulatory Visit: Payer: Self-pay

## 2021-05-14 DIAGNOSIS — Z483 Aftercare following surgery for neoplasm: Secondary | ICD-10-CM

## 2021-05-14 DIAGNOSIS — M25611 Stiffness of right shoulder, not elsewhere classified: Secondary | ICD-10-CM

## 2021-05-14 DIAGNOSIS — D0511 Intraductal carcinoma in situ of right breast: Secondary | ICD-10-CM | POA: Diagnosis not present

## 2021-05-14 DIAGNOSIS — Z9011 Acquired absence of right breast and nipple: Secondary | ICD-10-CM

## 2021-05-14 DIAGNOSIS — R600 Localized edema: Secondary | ICD-10-CM

## 2021-05-14 DIAGNOSIS — R293 Abnormal posture: Secondary | ICD-10-CM

## 2021-05-14 NOTE — Progress Notes (Signed)
Nancy Liu, [place] [address]   CLINIC:  Medical Oncology/Hematology  PCP:  Lucianne Lei, MD [address] [place] [address] [phone]   REASON FOR VISIT:  Follow-up for microcytic anemia  CURRENT THERAPY: Oral ferrous sulfate with IV iron as needed  INTERVAL HISTORY:  Nancy Liu 80 y.o. female returns for routine follow-up of her microcytic anemia.  She was last evaluated via telemedicine visit by Tarri Abernethy PA-C on 01/02/2021.  At today's visit, she reports feeling fair. Since her last appointment, she was diagnosed with right breast multifocal DCIS s/p right mastectomy, which is being managed at [facility] by Dr. Burr Medico.  Regarding her microcytic anemia, she denies any major bleeding events.  No epistaxis, hematemesis, hematochezia, or melena.  She does report that she has dark bowel movements from iron supplementation.  She continues to have some fatigue, but reports that her energy is improved after IV iron supplementation.  She denies any symptoms of pica, restless legs, headaches, chest pain, dyspnea on exertion, or syncope.  She does have some lightheadedness from time to time.  She continues to take her iron supplement daily.  This causes some constipation, which she says is relieved with stool softener.  She has been taking her vitamin D 50,000 units once per week.  She has 60% energy and 60% appetite. She endorses that she is maintaining a stable weight.    REVIEW OF SYSTEMS:  Review of Systems  Constitutional:  Positive for fatigue. Negative for appetite change, chills, diaphoresis, fever and unexpected weight change.  HENT:   Negative for lump/mass and nosebleeds.   Eyes:  Negative for eye problems.  Respiratory:  Negative for cough, hemoptysis and shortness of breath.   Cardiovascular:  Negative for chest pain, leg swelling and palpitations.  Gastrointestinal:  Positive for constipation. Negative  for abdominal pain, blood in stool, diarrhea, nausea and vomiting.  Genitourinary:  Negative for hematuria.   Musculoskeletal:  Positive for gait problem (Loses balance).  Skin: Negative.   Neurological:  Positive for gait problem (Loses balance) and numbness. Negative for dizziness, headaches and light-headedness.  Hematological:  Does not bruise/bleed easily.  Psychiatric/Behavioral:  Positive for depression.      PAST MEDICAL/SURGICAL HISTORY:  Past Medical History:  Diagnosis Date   Arteriosclerotic cardiovascular disease (ASCVD) 2001   Bare-metal stent placed in the right coronary artery in 12/01; residual 50% lesion of the first diagonal and mid LAD   Breast cancer (HCC)    Cerebrovascular disease    COPD (chronic obstructive pulmonary disease) (HCC)    Cyst, dermoid, arm, left 03/26/2018   Diabetes mellitus    excellent control with a low-dose of a single oral agent   Family history of breast cancer 01/23/2021   Family history of ovarian cancer 01/23/2021   GERD (gastroesophageal reflux disease)    with ulcers   History of right coronary artery stent placement 2000   Hyperlipidemia    Hypertension    Sleep apnea    Thalassemia minor    Tobacco abuse, in remission    35 pack years; Quit in 1980   Past Surgical History:  Procedure Laterality Date   BIOPSY  02/28/2020   Procedure: BIOPSY;  Surgeon: Eloise Harman, DO;  [facility];  Service: Endoscopy;;   COLONOSCOPY  2006   Dr. Collene Mares: normal   COLONOSCOPY  2000   Dr. Everlean Cherry: normal    COLONOSCOPY N/A 03/03/2016  Dr. Oneida Alar: diverticulosis, non-bleeding hemorrhoids, redundant left colon.   DILATION AND CURETTAGE OF UTERUS     ESOPHAGOGASTRODUODENOSCOPY  2000   Dr. Everlean Cherry: gastritis    ESOPHAGOGASTRODUODENOSCOPY  2006   Dr. Collene Mares: small hiatal hernia, gastritis, negative H.pylori    ESOPHAGOGASTRODUODENOSCOPY N/A 03/03/2016   Procedure: ESOPHAGOGASTRODUODENOSCOPY (EGD);  Surgeon: Danie Binder, MD;   [facility];  Service: Endoscopy;  Laterality: N/A;   ESOPHAGOGASTRODUODENOSCOPY N/A 06/03/2016   Dr. Oneida Alar: nonaggressive gastritis due to aspirin use. Previous ulcers had healed.   ESOPHAGOGASTRODUODENOSCOPY (EGD) WITH PROPOFOL N/A 02/28/2020   focal inflammation in the gastric antrum consistent with gastritis on biopsies. No H.pylori.    EXCISION OF KELOID Left 03/26/2018   Procedure: EXCISION OF LEFT ARM MASS;  Surgeon: Fanny Skates, MD;  Location: [place];  Service: General;  Laterality: Left;   GIVENS CAPSULE STUDY N/A 10/10/2020   Procedure: GIVENS CAPSULE STUDY;  Surgeon: Eloise Harman, DO;  [facility];  Service: Endoscopy;  Laterality: N/A;  7:30am   INGUINAL HERNIA REPAIR  1970s   Right   TOTAL MASTECTOMY Right 02/20/2021   Procedure: RIGHT TOTAL MASTECTOMY;  Surgeon: Rolm Bookbinder, MD;  Location: [place] [facility];  Service: General;  Laterality: Right;   VAGINAL HYSTERECTOMY  1980   Unilateral oophorectomy     SOCIAL HISTORY:  Social History   Socioeconomic History   Marital status: Divorced    Spouse name: Not on file   Number of children: 3   Years of education: Not on file   Highest education level: Not on file  Occupational History   Occupation: Retired    Comment: [employer] work  Tobacco Use   Smoking status: Former    Packs/day: 1.00    Years: 18.00    Pack years: 18.00    Types: Cigarettes    Start date: 06/02/1965    Quit date: 06/09/1980    Years since quitting: 40.9   Smokeless tobacco: Never  Vaping Use   Vaping Use: Never used  Substance and Sexual Activity   Alcohol use: No    Alcohol/week: 0.0 standard drinks   Drug use: No   Sexual activity: Never  Other Topics Concern   Not on file  Social History Narrative   Lives with daughter    Social Determinants of Health   Financial Resource Strain: Not on file  Food Insecurity: No Food Insecurity   Worried About Charity fundraiser in the Last Year: Never true    [place] in the Last Year: Never true  Transportation Needs: No Transportation Needs   Lack of Transportation (Medical): No   Lack of Transportation (Non-Medical): No  Physical Activity: Not on file  Stress: Not on file  Social Connections: Not on file  Intimate Partner Violence: Not on file    FAMILY HISTORY:  Family History  Problem Relation Age of Onset   Heart disease Mother        also hypertension and asthma   Lung cancer Mother        dx 5s   Ovarian cancer Mother        dx before 36   Diabetes Half-Sister    Breast cancer Half-Sister 22   Multiple myeloma Nephew 53       war-related exposures   Colon cancer Neg Hx    Colon polyps Neg Hx     CURRENT MEDICATIONS:  Outpatient Encounter Medications as of 05/15/2021  Medication Sig Note  acetaminophen (TYLENOL) 500 MG tablet Take 1,000 mg by mouth every 6 (six) hours as needed for moderate pain or headache.    albuterol (VENTOLIN HFA) 108 (90 Base) MCG/ACT inhaler Inhale 2 puffs into the lungs every 6 (six) hours as needed for wheezing or shortness of breath.    amLODipine (NORVASC) 10 MG tablet TAKE ONE TABLET BY MOUTH EVERYDAY AT BEDTIME    aspirin EC 81 MG tablet Take 81 mg by mouth daily. Swallow whole.    benzonatate (TESSALON) 100 MG capsule Take 2 capsules (200 mg total) by mouth every 8 (eight) hours as needed.    carvedilol (COREG) 6.25 MG tablet TAKE ONE TABLET BY MOUTH BEFORE BREAKFAST and TAKE ONE TABLET BY MOUTH EVERYDAY AT BEDTIME    cetirizine (ZYRTEC) 5 MG tablet Take 1 tablet (5 mg total) by mouth daily. (Patient taking differently: Take 5 mg by mouth daily as needed for allergies.)    ergocalciferol (VITAMIN D2) 1.25 MG (50000 UT) capsule Take 1 capsule (50,000 Units total) by mouth once a week. 02/14/2021: Tuesday   irbesartan-hydrochlorothiazide (AVALIDE) 300-12.5 MG tablet Take 1 tablet by mouth daily before breakfast.    iron polysaccharides (NIFEREX) 150 MG capsule Take 150 mg by mouth daily  before breakfast.    lansoprazole (PREVACID) 30 MG capsule TAKE ONE CAPSULE BY MOUTH BEFORE BREAKFAST and TAKE ONE CAPSULE BY MOUTH EVERYDAY AT BEDTIME    Latanoprostene Bunod 0.024 % SOLN Place 1 drop into both eyes once a week. 02/14/2021: Pt averages taking about once weekly   metFORMIN (GLUCOPHAGE) 1000 MG tablet Take 500 mg by mouth at bedtime.    nitroGLYCERIN (NITROSTAT) 0.4 MG SL tablet Place 1 tablet (0.4 mg total) under the tongue every 5 (five) minutes as needed for chest pain.    rosuvastatin (CRESTOR) 20 MG tablet TAKE ONE TABLET BY MOUTH EVERYDAY AT BEDTIME    sennosides-docusate sodium (SENOKOT-S) 8.6-50 MG tablet Take 2 tablets by mouth daily as needed for constipation.    traMADol (ULTRAM) 50 MG tablet Take 2 tablets (100 mg total) by mouth every 6 (six) hours as needed.    triamcinolone ointment (KENALOG) 0.5 % Apply 1 application topically 2 (two) times daily as needed (rash).    Turmeric 500 MG CAPS Take 500 mg by mouth daily.    No facility-administered encounter medications on file as of 05/15/2021.    ALLERGIES:  Allergies  Allergen Reactions   Protonix [Pantoprazole Sodium]     DRY LIPS     PHYSICAL EXAM:  ECOG PERFORMANCE STATUS: 1 - Symptomatic but completely ambulatory  There were no vitals filed for this visit. There were no vitals filed for this visit. Physical Exam Constitutional:      Appearance: Normal appearance.  HENT:     Head: Normocephalic and atraumatic.     Mouth/Throat:     Mouth: Mucous membranes are moist.  Eyes:     Extraocular Movements: Extraocular movements intact.     Pupils: Pupils are equal, round, and reactive to light.  Cardiovascular:     Rate and Rhythm: Normal rate and regular rhythm.     Pulses: Normal pulses.     Heart sounds: Normal heart sounds.  Pulmonary:     Effort: Pulmonary effort is normal.     Breath sounds: Normal breath sounds.  Chest:     Chest wall: Deformity (S/p right-sided mastectomy) present.   Abdominal:     General: Bowel sounds are normal.     Palpations: Abdomen is soft.  Tenderness: There is no abdominal tenderness.  Musculoskeletal:        General: No swelling.     Right lower leg: No edema.     Left lower leg: No edema.  Lymphadenopathy:     Cervical: No cervical adenopathy.  Skin:    General: Skin is warm and dry.  Neurological:     General: No focal deficit present.     Mental Status: She is alert and oriented to person, place, and time.  Psychiatric:        Mood and Affect: Mood normal.        Behavior: Behavior normal.     LABORATORY DATA:  I have reviewed the labs as listed.  CBC    Component Value Date/Time   WBC 7.2 05/13/2021 1017   RBC 4.81 05/13/2021 1017   HGB 12.5 05/13/2021 1017   HGB 12.9 01/23/2021 0745   HCT 39.9 05/13/2021 1017   PLT 262 05/13/2021 1017   PLT 264 01/23/2021 0745   MCV 83.0 05/13/2021 1017   MCH 26.0 05/13/2021 1017   MCHC 31.3 05/13/2021 1017   RDW 14.6 05/13/2021 1017   LYMPHSABS 1.2 05/13/2021 1017   MONOABS 0.6 05/13/2021 1017   EOSABS 0.3 05/13/2021 1017   BASOSABS 0.1 05/13/2021 1017   CMP Latest Ref Rng & Units 01/23/2021 12/26/2020 10/03/2020  Glucose 70 - 99 mg/dL 112(H) 109(H) 94  BUN 8 - 23 mg/dL _0 Creatinine 0.44 - 1.00 mg/dL 1.22(H) 1.16(H) 1.07(H)  Sodium 135 - 145 mmol/L 142 138 136  Potassium 3.5 - 5.1 mmol/L 4.2 4.2 3.5  Chloride 98 - 111 mmol/L 101 101 100  CO2 22 - 32 mmol/L _1 Calcium 8.9 - 10.3 mg/dL 10.0 9.2 9.2  Total Protein 6.5 - 8.1 g/dL 7.4 7.0 -  Total Bilirubin 0.3 - 1.2 mg/dL 0.6 0.7 -  Alkaline Phos 38 - 126 U/L 65 58 -  AST 15 - 41 U/L 24 21 -  ALT 0 - 44 U/L 17 15 -    DIAGNOSTIC IMAGING:  I have independently reviewed the relevant imaging and discussed with the patient.  ASSESSMENT & PLAN: 1.  Microcytic anemia - Secondary to iron deficiency. - She may have some element of thalassemia minor, as she has persistent microcytosis and elevated RBC.  Hemoglobin  fractionation cascade is pending. - Patient has no M spike on SPEP. - EGD (02/28/2020): Gastritis, otherwise normal - Colonoscopy (03/03/2016): Diverticulosis, nonbleeding internal hemorrhoids - She last received Feraheme on 01/04/2021 and 01/11/2021. - She is taking iron supplement with daily Niferex along with stool softener. - She reports dark stools from iron supplementation, but denies any bright red blood per rectum or melena. - She continues to have some chronic fatigue. - Most recent labs (05/13/2021): Hgb 12.5/MCV 83.0, ferritin 134, iron saturation 21% - PLAN: No indication for IV iron at this time.  Continue daily iron supplementation.  Repeat labs and RTC in 4 months.  2.  Vitamin D deficiency - Patient was previously taking vitamin D 50,000 units weekly, but this was cut back to every other week due to elevated vitamin D.  However, she has continued to take it on a weekly basis. - Most recent vitamin D (05/13/2021): Elevated at 133.41   - PLAN: Patient instructed to stop taking vitamin D supplementation.  We will recheck at follow-up visit in 4 months.  3.  Multifocal right breast DCIS, grade 2, ER/PR +  - She is being  managed by Dr. Burr Medico at [facility] - S/p right mastectomy, which showed multifocal intermediate grade DCIS, surgical margins negative.  Also showed atypical lobular hyperplasia, which is high risk for future breast cancer. - PLAN: Per note by Dr. Burr Medico from 03/13/2021, she will not be receiving any adjuvant antiestrogen therapy.  She will have left breast screening mammogram in January 2023, breast MRI in July 2023, and follow-up at [facility] in August 2023.   PLAN SUMMARY & DISPOSITION: Labs and RTC in 4 months  All questions were answered. The patient knows to call the clinic with any problems, questions or concerns.  Medical decision making: Low  Time spent on visit: I spent 20 minutes counseling the patient face to face. The total  time spent in the appointment was 30 minutes and more than 50% was on counseling.   Harriett Rush, PA-C  05/15/2021 11:10 AM

## 2021-05-14 NOTE — Therapy (Signed)
[facility] @ [facility] [place], [place], [address] Phone: [phone]   Fax:  [phone]  Physical Therapy Treatment  Patient Details  Name: Nancy Liu MRN: [id number] Date of Birth: Oct 15, 1940 Referring Provider (PT): Dr. Burr Medico   Encounter Date: 05/14/2021   PT End of Session - 05/14/21 0953     Visit Number 7    Number of Visits 14    Date for PT Re-Evaluation 06/11/21    PT Start Time 0904    PT Stop Time 0950    PT Time Calculation (min) 46 min    Activity Tolerance Patient tolerated treatment well    Behavior During Therapy [facility] for tasks assessed/performed             Past Medical History:  Diagnosis Date   Arteriosclerotic cardiovascular disease (ASCVD) 2001   Bare-metal stent placed in the right coronary artery in 12/01; residual 50% lesion of the first diagonal and mid LAD   Breast cancer [facility])    Cerebrovascular disease    COPD (chronic obstructive pulmonary disease) ([place])    Cyst, dermoid, arm, left 03/26/2018   Diabetes mellitus    excellent control with a low-dose of a single oral agent   Family history of breast cancer 01/23/2021   Family history of ovarian cancer 01/23/2021   GERD (gastroesophageal reflux disease)    with ulcers   History of right coronary artery stent placement 2000   Hyperlipidemia    Hypertension    Sleep apnea    Thalassemia minor    Tobacco abuse, in remission    35 pack years; Quit in 1980    Past Surgical History:  Procedure Laterality Date   BIOPSY  02/28/2020   Procedure: BIOPSY;  Surgeon: Eloise Harman, DO;  [facility];  Service: Endoscopy;;   COLONOSCOPY  2006   Dr. Collene Mares: normal   COLONOSCOPY  2000   Dr. Everlean Cherry: normal    COLONOSCOPY N/A 03/03/2016   Dr. Oneida Alar: diverticulosis, non-bleeding hemorrhoids, redundant left colon.   DILATION AND CURETTAGE OF UTERUS     ESOPHAGOGASTRODUODENOSCOPY  2000   Dr. Everlean Cherry: gastritis     ESOPHAGOGASTRODUODENOSCOPY  2006   Dr. Collene Mares: small hiatal hernia, gastritis, negative H.pylori    ESOPHAGOGASTRODUODENOSCOPY N/A 03/03/2016   Procedure: ESOPHAGOGASTRODUODENOSCOPY (EGD);  Surgeon: Danie Binder, MD;  [facility];  Service: Endoscopy;  Laterality: N/A;   ESOPHAGOGASTRODUODENOSCOPY N/A 06/03/2016   Dr. Oneida Alar: nonaggressive gastritis due to aspirin use. Previous ulcers had healed.   ESOPHAGOGASTRODUODENOSCOPY (EGD) WITH PROPOFOL N/A 02/28/2020   focal inflammation in the gastric antrum consistent with gastritis on biopsies. No H.pylori.    EXCISION OF KELOID Left 03/26/2018   Procedure: EXCISION OF LEFT ARM MASS;  Surgeon: Fanny Skates, MD;  Location: [facility];  Service: General;  Laterality: Left;   GIVENS CAPSULE STUDY N/A 10/10/2020   Procedure: GIVENS CAPSULE STUDY;  Surgeon: Eloise Harman, DO;  [facility];  Service: Endoscopy;  Laterality: N/A;  7:30am   INGUINAL HERNIA REPAIR  1970s   Right   TOTAL MASTECTOMY Right 02/20/2021   Procedure: RIGHT TOTAL MASTECTOMY;  Surgeon: Rolm Bookbinder, MD;  Location: [facility];  Service: General;  Laterality: Right;   VAGINAL HYSTERECTOMY  1980   Unilateral oophorectomy    There were no vitals filed for this visit.   Subjective Assessment - 05/14/21 0903     Subjective i think the tightness and  swelling are better.  Sometimes i think I 'm good and sometimes I think I need more.  I mainly feel like I need more for the reaching.    Pertinent History pt had right mastectomy 02/20/21 for DCIS with atypical lobular hyperplasia ER, PR+.  She does not require radiation or anti-estrogens.NIDDM, CAD, COPD, Heart valve problems    Patient Stated Goals improve my arm and breast    Currently in Pain? No/denies                [facility] PT Assessment - 05/14/21 0001       Assessment   Medical Diagnosis Right breast CAncer    Referring Provider (PT) Dr. Burr Medico      AROM   Right Shoulder  Extension 65 Degrees    Right Shoulder Flexion 150 Degrees    Right Shoulder ABduction 145 Degrees   feel of pull in the Rt upper arm                          [facility] Adult PT Treatment/Exercise - 05/14/21 0001       Shoulder Exercises: Stretch   Other Shoulder Stretches 120deg single arm wall stretch x 45" given as picture      Manual Therapy   Soft tissue mobilization to the Rt pectoralis in supine and around scar with arm propped overhead    Myofascial Release To Rt axilla and along pectoralis tendon    Manual Lymphatic Drainage (MLD) In Supine: Short neck, 5 diaphragmatic breaths, Lt axillary and Rt inguinal nodes and Rt axillo-inguinal and anterior inter-axillary anastomsois, then focused on superior chest wall and lateral trunk redirecting towards anastomosis and reviewing verbally with pt throughout    Passive ROM In Supine to Rt shoulder into flexion abduction, and D2 to pts tolerance with VCs during to relax which pt was able to do better throughout session                          PT Long Term Goals - 05/14/21 0911       PT LONG TERM GOAL #1   Title pt will be independent in self MLD to right axillary/chest region    Baseline Doesn't feel quite ready. Daughter tries to help    Time 4    Status On-going      PT LONG TERM GOAL #2   Title Pt will report swelling improved by at least 50%    Status Achieved      PT LONG TERM GOAL #3   Title pt will be independent in home strengthening program    Status On-going      PT LONG TERM GOAL #4   Title Pt will report ease of reaching into the 2nd shelf of the cabinet by at least 50%    Baseline not quite able to reach the 2nd shelf - has to go on toes    Status On-going                   Plan - 05/14/21 0954     Clinical Impression Statement Reassessed for re-cert today.  Pt is doing much better in regards to edema and AROM except for end range pectoralis tightness in overhead  flexion/abduction combination - Y position.  Extended POC x 4 more weeks to maximize ROM and keep practicing MLD.    PT Frequency 1x / week    PT  Duration 4 weeks    PT Treatment/Interventions ADLs/Self Care Home Management;Therapeutic exercise;Manual techniques;Patient/family education;Manual lymph drainage;Scar mobilization;Passive range of motion    PT Next Visit Plan review self MLD with patient as her daughter used to be doing this but is now unable due to work schedule change. work on pectoralis and end range AROM improvements    PT Home Exercise Plan pt is perofrming 4 post op exercises and single arm doorway    Consulted and Agree with Plan of Care Patient             Patient will benefit from skilled therapeutic intervention in order to improve the following deficits and impairments:     Visit Diagnosis: Intraductal carcinoma in situ of right breast  Aftercare following surgery for neoplasm  Abnormal posture  Localized edema  S/P mastectomy, right  Stiffness of right shoulder, not elsewhere classified     Problem List Patient Active Problem List   Diagnosis Date Noted   GERD (gastroesophageal reflux disease) 03/05/2021   S/P mastectomy, right 02/20/2021   Genetic testing 02/12/2021   Family history of breast cancer 01/23/2021   Family history of ovarian cancer 01/23/2021   Ductal carcinoma in situ (DCIS) of right breast 01/17/2021   Black stool 08/24/2020   Non-insulin dependent type 2 diabetes mellitus ([place]) 03/26/2020   Loss of weight 07/13/2019   Cyst, dermoid, arm, left 03/26/2018   Diarrhea 08/10/2017   Constipation 02/10/2017   AVM (arteriovenous malformation) of small bowel, acquired 02/10/2017   Iron deficiency anemia due to chronic blood loss 07/23/2016   Gastritis and gastroduodenitis    Symptomatic anemia 02/29/2016   Weight loss 04/01/2012   Chest pain on exertion 02/07/2011   Microcytic anemia 01/31/2010   Cerebrovascular disease 01/31/2010    CAD S/P percutaneous coronary angioplasty 01/03/2009   Dyslipidemia, goal LDL below 70 10/22/2007   Essential hypertension 10/22/2007    Stark Bray, PT 05/14/2021, 9:56 AM  [facility] @ [place], [place], [address] Phone: [phone]   Fax:  [phone]  Name: Nancy Liu MRN: [id number] Date of Birth: 07/02/1940

## 2021-05-14 NOTE — Patient Instructions (Signed)
Access Code: [facility]  URL: [email or web address]: 12/13/2022Prepared by: Marcene Brawn TevisExercises  Single Arm Doorway Pec Stretch at 120 Degrees Abduction - 1-2 x daily - 7 x weekly - 1 sets - 3 reps - 20-30 seconds hold

## 2021-05-15 ENCOUNTER — Inpatient Hospital Stay (HOSPITAL_COMMUNITY): Payer: PPO | Admitting: Physician Assistant

## 2021-05-15 VITALS — BP 126/81 | HR 73 | Temp 98.2°F | Resp 18 | Ht 62.0 in | Wt 151.8 lb

## 2021-05-15 DIAGNOSIS — D509 Iron deficiency anemia, unspecified: Secondary | ICD-10-CM | POA: Diagnosis not present

## 2021-05-15 DIAGNOSIS — E559 Vitamin D deficiency, unspecified: Secondary | ICD-10-CM

## 2021-05-15 DIAGNOSIS — D500 Iron deficiency anemia secondary to blood loss (chronic): Secondary | ICD-10-CM

## 2021-05-15 LAB — HGB FRACTIONATION BY HPLC
Hgb A: 97.8 % (ref 96.4–98.8)
Hgb C: 0 %
Hgb E: 0 %
Hgb F: 0 % (ref 0.0–2.0)
Hgb S: 0 %
Hgb Variant: 1.1 % — ABNORMAL HIGH

## 2021-05-15 LAB — HGB FRACTIONATION CASCADE: Hgb A2: 1.1 % — ABNORMAL LOW (ref 1.8–3.2)

## 2021-05-15 NOTE — Patient Instructions (Signed)
[place] at [facility] Discharge Instructions  You were seen today by Tarri Abernethy PA-C for your iron deficiency anemia.  Your blood and iron levels look great!  You do not need any IV iron right now.  Continue to take your iron supplement pill once daily, along with stool softener as needed for constipation caused by the iron pill.  Your vitamin D level is higher than normal, so for the time being you should STOP taking your vitamin D pill.  We will check your labs again at your next follow-up visit to see if you need to restart vitamin D.  LABS: Return in 4 months for repeat labs  OTHER TESTS: No other tests at this time  MEDICATIONS: Stop taking vitamin D.  Continue iron.  No other changes to home medications.  FOLLOW-UP APPOINTMENT: Office visit in 4 months, after labs   Thank you for choosing [place] at [facility] to provide your oncology and hematology care.  To afford each patient quality time with our provider, please arrive at least 15 minutes before your scheduled appointment time.   If you have a lab appointment with the [facility] please come in thru the Main Entrance and check in at the main information desk.  You need to re-schedule your appointment should you arrive 10 or more minutes late.  We strive to give you quality time with our providers, and arriving late affects you and other patients whose appointments are after yours.  Also, if you no show three or more times for appointments you may be dismissed from the clinic at the providers discretion.     Again, thank you for choosing [facility].  Our hope is that these requests will decrease the amount of time that you wait before being seen by our physicians.       _____________________________________________________________  Should you have questions after your visit to [facility], please contact our office at [phone] and  follow the prompts.  Our office hours are 8:00 a.m. and 4:30 p.m. Monday - Friday.  Please note that voicemails left after 4:00 p.m. may not be returned until the following business day.  We are closed weekends and major holidays.  You do have access to a nurse 24-7, just call the main number to the clinic [phone] and do not press any options, hold on the line and a nurse will answer the phone.    For prescription refill requests, have your pharmacy contact our office and allow 72 hours.    Due to Covid, you will need to wear a mask upon entering the hospital. If you do not have a mask, a mask will be given to you at the Main Entrance upon arrival. For doctor visits, patients may have 1 support person age 79 or older with them. For treatment visits, patients can not have anyone with them due to social distancing guidelines and our immunocompromised population.

## 2021-05-16 DIAGNOSIS — H2513 Age-related nuclear cataract, bilateral: Secondary | ICD-10-CM | POA: Diagnosis not present

## 2021-05-16 DIAGNOSIS — H04123 Dry eye syndrome of bilateral lacrimal glands: Secondary | ICD-10-CM | POA: Diagnosis not present

## 2021-05-16 DIAGNOSIS — H401131 Primary open-angle glaucoma, bilateral, mild stage: Secondary | ICD-10-CM | POA: Diagnosis not present

## 2021-05-22 ENCOUNTER — Encounter: Payer: Self-pay | Admitting: Rehabilitation

## 2021-05-22 ENCOUNTER — Other Ambulatory Visit: Payer: Self-pay

## 2021-05-22 ENCOUNTER — Ambulatory Visit: Payer: PPO | Admitting: Rehabilitation

## 2021-05-22 DIAGNOSIS — M25611 Stiffness of right shoulder, not elsewhere classified: Secondary | ICD-10-CM

## 2021-05-22 DIAGNOSIS — D0511 Intraductal carcinoma in situ of right breast: Secondary | ICD-10-CM

## 2021-05-22 DIAGNOSIS — R293 Abnormal posture: Secondary | ICD-10-CM

## 2021-05-22 DIAGNOSIS — Z9011 Acquired absence of right breast and nipple: Secondary | ICD-10-CM

## 2021-05-22 DIAGNOSIS — Z483 Aftercare following surgery for neoplasm: Secondary | ICD-10-CM

## 2021-05-22 DIAGNOSIS — R600 Localized edema: Secondary | ICD-10-CM

## 2021-05-22 NOTE — Therapy (Signed)
[place] @ [place], [place], [address] Phone: [phone]   Fax:  [phone]  Physical Therapy Treatment  Patient Details  Name: Nancy Liu MRN: [id number] Date of Birth: 07-21-1940 Referring Provider (PT): Dr. Burr Medico   Encounter Date: 05/22/2021   PT End of Session - 05/22/21 0947     Visit Number 8    Number of Visits 14    Date for PT Re-Evaluation 06/11/21    PT Start Time 0900    PT Stop Time 3419    PT Time Calculation (min) 48 min    Activity Tolerance Patient tolerated treatment well    Behavior During Therapy [facility] for tasks assessed/performed             Past Medical History:  Diagnosis Date   Arteriosclerotic cardiovascular disease (ASCVD) 2001   Bare-metal stent placed in the right coronary artery in 12/01; residual 50% lesion of the first diagonal and mid LAD   Breast cancer [facility])    Cerebrovascular disease    COPD (chronic obstructive pulmonary disease) ([place])    Cyst, dermoid, arm, left 03/26/2018   Diabetes mellitus    excellent control with a low-dose of a single oral agent   Family history of breast cancer 01/23/2021   Family history of ovarian cancer 01/23/2021   GERD (gastroesophageal reflux disease)    with ulcers   History of right coronary artery stent placement 2000   Hyperlipidemia    Hypertension    Sleep apnea    Thalassemia minor    Tobacco abuse, in remission    35 pack years; Quit in 1980    Past Surgical History:  Procedure Laterality Date   BIOPSY  02/28/2020   Procedure: BIOPSY;  Surgeon: Eloise Harman, DO;  [facility];  Service: Endoscopy;;   COLONOSCOPY  2006   Dr. Collene Mares: normal   COLONOSCOPY  2000   Dr. Everlean Cherry: normal    COLONOSCOPY N/A 03/03/2016   Dr. Oneida Alar: diverticulosis, non-bleeding hemorrhoids, redundant left colon.   DILATION AND CURETTAGE OF UTERUS     ESOPHAGOGASTRODUODENOSCOPY  2000   Dr. Everlean Cherry: gastritis     ESOPHAGOGASTRODUODENOSCOPY  2006   Dr. Collene Mares: small hiatal hernia, gastritis, negative H.pylori    ESOPHAGOGASTRODUODENOSCOPY N/A 03/03/2016   Procedure: ESOPHAGOGASTRODUODENOSCOPY (EGD);  Surgeon: Danie Binder, MD;  [facility];  Service: Endoscopy;  Laterality: N/A;   ESOPHAGOGASTRODUODENOSCOPY N/A 06/03/2016   Dr. Oneida Alar: nonaggressive gastritis due to aspirin use. Previous ulcers had healed.   ESOPHAGOGASTRODUODENOSCOPY (EGD) WITH PROPOFOL N/A 02/28/2020   focal inflammation in the gastric antrum consistent with gastritis on biopsies. No H.pylori.    EXCISION OF KELOID Left 03/26/2018   Procedure: EXCISION OF LEFT ARM MASS;  Surgeon: Fanny Skates, MD;  Location: [facility];  Service: General;  Laterality: Left;   GIVENS CAPSULE STUDY N/A 10/10/2020   Procedure: GIVENS CAPSULE STUDY;  Surgeon: Eloise Harman, DO;  [facility];  Service: Endoscopy;  Laterality: N/A;  7:30am   INGUINAL HERNIA REPAIR  1970s   Right   TOTAL MASTECTOMY Right 02/20/2021   Procedure: RIGHT TOTAL MASTECTOMY;  Surgeon: Rolm Bookbinder, MD;  Location: [facility];  Service: General;  Laterality: Right;   VAGINAL HYSTERECTOMY  1980   Unilateral oophorectomy    There were no vitals filed for this visit.   Subjective Assessment - 05/22/21 0854     Subjective A little sore from trying  to do more but not at rest    Pertinent History pt had right mastectomy 02/20/21 for DCIS with atypical lobular hyperplasia ER, PR+.  She does not require radiation or anti-estrogens.NIDDM, CAD, COPD, Heart valve problems    Currently in Pain? No/denies                               [facility] Adult PT Treatment/Exercise - 05/22/21 0001       Shoulder Exercises: Standing   Row Both;10 reps    Theraband Level (Shoulder Row) Level 1 (Yellow)      Shoulder Exercises: Pulleys   Flexion 2 minutes    ABduction 2 minutes    Other Pulley Exercises gave info for pulley ordering       Shoulder Exercises: Therapy Ball   Flexion 5 reps;Both    Flexion Limitations with initial cueing      Manual Therapy   Soft tissue mobilization to the Rt pectoralis in supine and around scar with arm propped overhead    Myofascial Release To Rt axilla and along pectoralis tendon    Manual Lymphatic Drainage (MLD) In Supine: Short neck, 5 diaphragmatic breaths, Lt axillary and Rt inguinal nodes and Rt axillo-inguinal and anterior inter-axillary anastomsois, then focused on superior chest wall and lateral trunk redirecting towards anastomosis and reviewing verbally with pt throughout    Passive ROM In Supine to Rt shoulder into flexion abduction, and D2 to pts tolerance with VCs during to relax which pt was able to do better throughout session                          PT Long Term Goals - 05/22/21 0943       PT LONG TERM GOAL #1   Title pt will be independent in self MLD to right axillary/chest region    Status Achieved      PT LONG TERM GOAL #2   Title Pt will report swelling improved by at least 50%    Status Achieved      PT LONG TERM GOAL #3   Title pt will be independent in home strengthening program    Status Achieved      PT LONG TERM GOAL #4   Title Pt will report ease of reaching into the 2nd shelf of the cabinet by at least 50%    Status On-going                   Plan - 05/22/21 0948     Clinical Impression Statement Pt decided that she is feeling good at this point and feels okay with self stretching and wants to stop now at the end of the year.  PT okay with this as pt is ind with self care.  All goals met.    PT Next Visit Plan DC    Consulted and Agree with Plan of Care Patient             Patient will benefit from skilled therapeutic intervention in order to improve the following deficits and impairments:     Visit Diagnosis: Intraductal carcinoma in situ of right breast  Aftercare following surgery for neoplasm  Abnormal  posture  Localized edema  S/P mastectomy, right  Stiffness of right shoulder, not elsewhere classified     Problem List Patient Active Problem List   Diagnosis Date Noted   GERD (gastroesophageal reflux  disease) 03/05/2021   S/P mastectomy, right 02/20/2021   Genetic testing 02/12/2021   Family history of breast cancer 01/23/2021   Family history of ovarian cancer 01/23/2021   Ductal carcinoma in situ (DCIS) of right breast 01/17/2021   Black stool 08/24/2020   Non-insulin dependent type 2 diabetes mellitus ([place]) 03/26/2020   Loss of weight 07/13/2019   Cyst, dermoid, arm, left 03/26/2018   Diarrhea 08/10/2017   Constipation 02/10/2017   AVM (arteriovenous malformation) of small bowel, acquired 02/10/2017   Iron deficiency anemia due to chronic blood loss 07/23/2016   Gastritis and gastroduodenitis    Symptomatic anemia 02/29/2016   Weight loss 04/01/2012   Chest pain on exertion 02/07/2011   Microcytic anemia 01/31/2010   Cerebrovascular disease 01/31/2010   CAD S/P percutaneous coronary angioplasty 01/03/2009   Dyslipidemia, goal LDL below 70 10/22/2007   Essential hypertension 10/22/2007    Stark Bray, PT 05/22/2021, 9:49 AM  [facility] @ [place], [place], [address] Phone: [phone]   Fax:  [phone]  Name: Nancy Liu MRN: [id number] Date of Birth: 08-03-1940   PHYSICAL THERAPY DISCHARGE SUMMARY  Visits from Start of Care: 8       Current functional level related to goals / functional outcomes: See above   Remaining deficits: See above   Education / Equipment: See above Plan: Patient agrees to discharge.  Patient goals were not met. Patient is being discharged due to meeting the stated rehab goals.

## 2021-05-22 NOTE — Patient Instructions (Signed)
Access Code: [email or web address]: [email or web address]: 12/21/2022Prepared by: Marcene Brawn TevisExercises  Seated Shoulder Abduction AAROM with Pulley Behind - 1 x daily - 7 x weekly - 1 sets - 2-38min hold  Seated Shoulder Flexion AAROM with Pulley Behind - 1 x daily - 7 x weekly - 1 sets - 2-3 min hold  Standing shoulder flexion wall slides - 1 x daily - 7 x weekly - 1 sets - 5 reps - 10 sec hold  Supine Chest Stretch with Elbows Bent - 1 x daily - 7 x weekly - 1 sets - 3 reps - 20 sec hold  Standing Shoulder Row - 3 x daily - 7 x weekly - 1 sets - 10 reps - no hold

## 2021-05-24 ENCOUNTER — Telehealth: Payer: Self-pay | Admitting: Adult Health

## 2021-05-24 NOTE — Telephone Encounter (Signed)
Reviewed SCP visit with Maxine and sent schedule request for this to be set up on 06/04/2021 at 0945.    Wilber Bihari, NP 05/24/21 4:14 PM Medical Oncology and Hematology [facility] [place], [place] [address] Tel. [phone]    Fax. [phone]

## 2021-05-31 ENCOUNTER — Telehealth: Payer: Self-pay | Admitting: *Deleted

## 2021-05-31 DIAGNOSIS — E7849 Other hyperlipidemia: Secondary | ICD-10-CM | POA: Diagnosis not present

## 2021-05-31 DIAGNOSIS — K219 Gastro-esophageal reflux disease without esophagitis: Secondary | ICD-10-CM | POA: Diagnosis not present

## 2021-05-31 DIAGNOSIS — I10 Essential (primary) hypertension: Secondary | ICD-10-CM | POA: Diagnosis not present

## 2021-05-31 DIAGNOSIS — E119 Type 2 diabetes mellitus without complications: Secondary | ICD-10-CM | POA: Diagnosis not present

## 2021-06-04 ENCOUNTER — Inpatient Hospital Stay: Payer: PPO | Attending: Hematology | Admitting: Adult Health

## 2021-06-04 ENCOUNTER — Encounter: Payer: Self-pay | Admitting: Adult Health

## 2021-06-04 ENCOUNTER — Encounter: Payer: Self-pay | Admitting: General Practice

## 2021-06-04 DIAGNOSIS — D0511 Intraductal carcinoma in situ of right breast: Secondary | ICD-10-CM | POA: Diagnosis not present

## 2021-06-04 DIAGNOSIS — E2839 Other primary ovarian failure: Secondary | ICD-10-CM | POA: Diagnosis not present

## 2021-06-04 NOTE — Progress Notes (Signed)
[place] CSW Progress Notes  Called patient per referral from Clovis Riley, NP.  Concerned about "mild depression."  Depression screening from this morning rates her PHQ score at a "10."  Patient reports "it is not that serious"; however, does report increased fatigue/sleeping a lot, changes in appetite resulting in weight loss, little interest in things.  She has struggled with fatigue and social withdrawal, beginning during [place] pandemic when she herself was diagnosed with COVID. "I do not ever want to get that again", so she has limited her social gatherings.  She does have the support of her daughter, but does not socialize widely in the community. She is interested in increasing her fitness - she and her daughter are [employer] members and live near the [place].  She is interested in the [facility] program - referral made.  She also reports some gait instability -this is limiting her ability to accomplish things she likes to do.  Advised her to discuss this w her PCP.  She also admits that she is not taking the antidepressant prescribed by her PCP as it made her "too sleepy."  She will contact PCP and discuss other options.  No other concerns at this time.  Edwyna Shell, LCSW Clinical Social Worker Phone:  [phone]

## 2021-06-04 NOTE — Progress Notes (Signed)
SURVIVORSHIP VIRTUAL VISIT:  I connected with Nancy Liu on 06/04/21 at  9:45 AM EST by telephone and verified that I am speaking with the correct person using two identifiers.  I discussed the limitations, risks, security and privacy concerns of performing an evaluation and management service by telephone and the availability of in person appointments. I also discussed with the patient that there may be a patient responsible charge related to this service. The patient expressed understanding and agreed to proceed.  Patient location: at home Provider location: [facility] office Others participating in call: Daughter: Nancy Liu  BRIEF ONCOLOGIC HISTORY:  Oncology History  Ductal carcinoma in situ (DCIS) of right breast  01/01/2021 Mammogram   FINDINGS: Multiple circumscribed oval masses are noted in the MEDIAL portion of the RIGHT breast. Masses vary from 1-4 millimeters in diameter and appear contiguous, spanning 8.9 x 3.6 x 5.1 centimeters. There are faint calcifications spanning 4.2 x 1.9 x 3.5 centimeters in this same, segmental distribution.   01/14/2021 Cancer Staging   Staging form: Breast, AJCC 8th Edition - Clinical stage from 01/14/2021: Stage 0 (cTis (DCIS), cN0, cM0, G2, ER+, PR+, HER2: Not Assessed) - Signed by Truitt Merle, MD on 01/21/2021 Stage prefix: Initial diagnosis Histologic grading system: 3 grade system    01/14/2021 Pathology Results   Diagnosis 1. Breast, right, needle core biopsy, anterior extent - DUCTAL CARCINOMA IN SITU, INTERMEDIATE GRADE WITH CALCIFICATIONS. SEE NOTE 2. Breast, right, needle core biopsy, posterior extent - DUCTAL CARCINOMA IN SITU, INTERMEDIATE GRADE WITH CALCIFICATIONS. SEE NOTE Diagnosis Note 1. and 2. DCIS measures 0.6 cm in part 1 and 0.4 cm in part 2.  1. PROGNOSTIC INDICATORS Results: IMMUNOHISTOCHEMICAL AND MORPHOMETRIC ANALYSIS PERFORMED MANUALLY Estrogen Receptor: >95%, POSITIVE, STRONG STAINING INTENSITY Progesterone Receptor:  >95%, POSITIVE, STRONG STAINING INTENSITY     01/17/2021 Initial Diagnosis   Ductal carcinoma in situ (DCIS) of right breast   02/05/2021 Genetic Testing   Negative hereditary cancer genetic testing: no pathogenic variants detected in [facility] CustomNext-Cancer +RNAinsight Panel.  The report date is February 05, 2021.   The CustomNext-Cancer+RNAinsight panel offered by [facility] includes sequencing and rearrangement analysis for the following 47 genes:  APC, ATM, AXIN2, BARD1, BMPR1A, BRCA1, BRCA2, BRIP1, CDH1, CDK4, CDKN2A, CHEK2, DICER1, EPCAM, GREM1, HOXB13, MEN1, MLH1, MSH2, MSH3, MSH6, MUTYH, NBN, NF1, NF2, NTHL1, PALB2, PMS2, POLD1, POLE, PTEN, RAD51C, RAD51D, RECQL, RET, SDHA, SDHAF2, SDHB, SDHC, SDHD, SMAD4, SMARCA4, STK11, TP53, TSC1, TSC2, and VHL.  RNA data is routinely analyzed for use in variant interpretation for all genes.   02/20/2021 Cancer Staging   Staging form: Breast, AJCC 8th Edition - Pathologic stage from 02/20/2021: No Stage Recommended (pTis (DCIS), pNX, cM1, G2, ER+, PR+, HER2: Not Assessed) - Signed by Truitt Merle, MD on 03/12/2021 Stage prefix: Initial diagnosis Histologic grading system: 3 grade system Residual tumor (R): R0 - None    02/20/2021 Definitive Surgery   FINAL MICROSCOPIC DIAGNOSIS:   A. BREAST, RIGHT, MASTECTOMY:  - Multifocal intermediate grade ductal carcinoma in situ with calcifications.  - Atypical lobular hyperplasia.  - Margins are negative for carcinoma.  - Biopsy site (x2).  - See oncology table.      INTERVAL HISTORY:  Ms. Detjen to review her survivorship care plan detailing her treatment course for breast cancer, as well as monitoring long-term side effects of that treatment, education regarding health maintenance, screening, and overall wellness and health promotion.     Overall, Ms. Zetino reports feeling quite well.  She notes that she  sometimes feels off balanced with walking since her surgery.    REVIEW OF SYSTEMS:  Review of  Systems  Constitutional:  Negative for appetite change, chills, fatigue, fever and unexpected weight change.  HENT:   Negative for hearing loss, lump/mass and trouble swallowing.   Eyes:  Negative for eye problems and icterus.  Respiratory:  Negative for chest tightness, cough and shortness of breath.   Cardiovascular:  Negative for chest pain, leg swelling and palpitations.  Gastrointestinal:  Negative for abdominal distention, abdominal pain, constipation, diarrhea, nausea and vomiting.  Endocrine: Negative for hot flashes.  Genitourinary:  Negative for difficulty urinating.   Musculoskeletal:  Positive for gait problem (occ off balance per interval history). Negative for arthralgias.  Skin:  Negative for itching and rash.  Neurological:  Positive for gait problem (occ off balance per interval history). Negative for dizziness, extremity weakness, headaches and numbness.  Hematological:  Negative for adenopathy. Does not bruise/bleed easily.  Psychiatric/Behavioral:  Negative for depression. The patient is not nervous/anxious.   Breast: Denies any new nodularity, masses, tenderness, nipple changes, or nipple discharge.      ONCOLOGY TREATMENT TEAM:  1. Surgeon:  Dr. Donne Hazel at [facility] 2. Medical Oncologist: Dr. Burr Medico  3. Radiation Oncologist: Dr. Donne Hazel    PAST MEDICAL/SURGICAL HISTORY:  Past Medical History:  Diagnosis Date   Arteriosclerotic cardiovascular disease (ASCVD) 2001   Bare-metal stent placed in the right coronary artery in 12/01; residual 50% lesion of the first diagonal and mid LAD   Breast cancer ([place])    Cerebrovascular disease    COPD (chronic obstructive pulmonary disease) (HCC)    Cyst, dermoid, arm, left 03/26/2018   Diabetes mellitus    excellent control with a low-dose of a single oral agent   Family history of breast cancer 01/23/2021   Family history of ovarian cancer 01/23/2021   GERD (gastroesophageal reflux disease)    with  ulcers   History of right coronary artery stent placement 2000   Hyperlipidemia    Hypertension    Sleep apnea    Thalassemia minor    Tobacco abuse, in remission    35 pack years; Quit in 1980   Past Surgical History:  Procedure Laterality Date   BIOPSY  02/28/2020   Procedure: BIOPSY;  Surgeon: Eloise Harman, DO;  [facility];  Service: Endoscopy;;   COLONOSCOPY  2006   Dr. Collene Mares: normal   COLONOSCOPY  2000   Dr. Everlean Cherry: normal    COLONOSCOPY N/A 03/03/2016   Dr. Oneida Alar: diverticulosis, non-bleeding hemorrhoids, redundant left colon.   DILATION AND CURETTAGE OF UTERUS     ESOPHAGOGASTRODUODENOSCOPY  2000   Dr. Everlean Cherry: gastritis    ESOPHAGOGASTRODUODENOSCOPY  2006   Dr. Collene Mares: small hiatal hernia, gastritis, negative H.pylori    ESOPHAGOGASTRODUODENOSCOPY N/A 03/03/2016   Procedure: ESOPHAGOGASTRODUODENOSCOPY (EGD);  Surgeon: Danie Binder, MD;  [facility];  Service: Endoscopy;  Laterality: N/A;   ESOPHAGOGASTRODUODENOSCOPY N/A 06/03/2016   Dr. Oneida Alar: nonaggressive gastritis due to aspirin use. Previous ulcers had healed.   ESOPHAGOGASTRODUODENOSCOPY (EGD) WITH PROPOFOL N/A 02/28/2020   focal inflammation in the gastric antrum consistent with gastritis on biopsies. No H.pylori.    EXCISION OF KELOID Left 03/26/2018   Procedure: EXCISION OF LEFT ARM MASS;  Surgeon: Fanny Skates, MD;  Location: [place];  Service: General;  Laterality: Left;   GIVENS CAPSULE STUDY N/A 10/10/2020   Procedure: GIVENS CAPSULE STUDY;  Surgeon: Eloise Harman, DO;  [facility];  Service: Endoscopy;  Laterality: N/A;  7:30am   INGUINAL HERNIA REPAIR  1970s   Right   TOTAL MASTECTOMY Right 02/20/2021   Procedure: RIGHT TOTAL MASTECTOMY;  Surgeon: Rolm Bookbinder, MD;  Location: [place];  Service: General;  Laterality: Right;   VAGINAL HYSTERECTOMY  1980   Unilateral oophorectomy     ALLERGIES:  Allergies  Allergen Reactions   Protonix  [Pantoprazole Sodium]     DRY LIPS     CURRENT MEDICATIONS:  Outpatient Encounter Medications as of 06/04/2021  Medication Sig Note   acetaminophen (TYLENOL) 500 MG tablet Take 1,000 mg by mouth every 6 (six) hours as needed for moderate pain or headache.    albuterol (VENTOLIN HFA) 108 (90 Base) MCG/ACT inhaler Inhale 2 puffs into the lungs every 6 (six) hours as needed for wheezing or shortness of breath.    amLODipine (NORVASC) 10 MG tablet TAKE ONE TABLET BY MOUTH EVERYDAY AT BEDTIME    aspirin EC 81 MG tablet Take 81 mg by mouth daily. Swallow whole.    benzonatate (TESSALON) 100 MG capsule Take 2 capsules (200 mg total) by mouth every 8 (eight) hours as needed.    carvedilol (COREG) 6.25 MG tablet TAKE ONE TABLET BY MOUTH BEFORE BREAKFAST and TAKE ONE TABLET BY MOUTH EVERYDAY AT BEDTIME    cetirizine (ZYRTEC) 5 MG tablet Take 1 tablet (5 mg total) by mouth daily. (Patient taking differently: Take 5 mg by mouth daily as needed for allergies.)    citalopram (CELEXA) 10 MG tablet Take by mouth.    doxepin (SINEQUAN) 10 MG capsule Take 10 mg by mouth at bedtime.    fluticasone (FLONASE) 50 MCG/ACT nasal spray Place 2 sprays into both nostrils daily.    irbesartan-hydrochlorothiazide (AVALIDE) 300-12.5 MG tablet Take 1 tablet by mouth daily before breakfast.    iron polysaccharides (NIFEREX) 150 MG capsule Take 150 mg by mouth daily before breakfast.    lansoprazole (PREVACID) 30 MG capsule TAKE ONE CAPSULE BY MOUTH BEFORE BREAKFAST and TAKE ONE CAPSULE BY MOUTH EVERYDAY AT BEDTIME    Latanoprostene Bunod 0.024 % SOLN Place 1 drop into both eyes once a week. 02/14/2021: Pt averages taking about once weekly   metFORMIN (GLUCOPHAGE) 1000 MG tablet Take 500 mg by mouth at bedtime.    nitroGLYCERIN (NITROSTAT) 0.4 MG SL tablet Place 1 tablet (0.4 mg total) under the tongue every 5 (five) minutes as needed for chest pain.    rosuvastatin (CRESTOR) 20 MG tablet TAKE ONE TABLET BY MOUTH EVERYDAY AT  BEDTIME    sennosides-docusate sodium (SENOKOT-S) 8.6-50 MG tablet Take 2 tablets by mouth daily as needed for constipation.    traMADol (ULTRAM) 50 MG tablet Take 2 tablets (100 mg total) by mouth every 6 (six) hours as needed.    triamcinolone ointment (KENALOG) 0.5 % Apply 1 application topically 2 (two) times daily as needed (rash).    Turmeric 500 MG CAPS Take 500 mg by mouth daily.    No facility-administered encounter medications on file as of 06/04/2021.     ONCOLOGIC FAMILY HISTORY:  Family History  Problem Relation Age of Onset   Heart disease Mother        also hypertension and asthma   Lung cancer Mother        dx 22s   Ovarian cancer Mother        dx before 23   Diabetes Half-Sister    Breast cancer Half-Sister 40   Multiple myeloma  Nephew 53       war-related exposures   Colon cancer Neg Hx    Colon polyps Neg Hx      GENETIC COUNSELING/TESTING: See above  SOCIAL HISTORY:  Social History   Socioeconomic History   Marital status: Divorced    Spouse name: Not on file   Number of children: 3   Years of education: Not on file   Highest education level: Not on file  Occupational History   Occupation: Retired    Comment: [place] work  Tobacco Use   Smoking status: Former    Packs/day: 1.00    Years: 18.00    Pack years: 18.00    Types: Cigarettes    Start date: 06/02/1965    Quit date: 06/09/1980    Years since quitting: 41.0   Smokeless tobacco: Never  Vaping Use   Vaping Use: Never used  Substance and Sexual Activity   Alcohol use: No    Alcohol/week: 0.0 standard drinks   Drug use: No   Sexual activity: Never  Other Topics Concern   Not on file  Social History Narrative   Lives with daughter    Social Determinants of Health   Financial Resource Strain: Not on file  Food Insecurity: No Food Insecurity   Worried About Charity fundraiser in the Last Year: Never true   [place] in the Last Year: Never true  Transportation Needs: No  Transportation Needs   Lack of Transportation (Medical): No   Lack of Transportation (Non-Medical): No  Physical Activity: Not on file  Stress: Not on file  Social Connections: Not on file  Intimate Partner Violence: Not on file     OBSERVATIONS/OBJECTIVE:  Patient sounds well.  Her breathing is nonlabored, mood and behavior normal, speech is normal.  LABORATORY DATA:  None for this visit.  DIAGNOSTIC IMAGING:  None for this visit.      ASSESSMENT AND PLAN:  Ms.. Viviani is a pleasant 81 y.o. female with Stage 0 right breast DCIS, ER+/PR+/HER2-, diagnosed in 12/2020, treated with right mastectomy.  She presents to the Survivorship Clinic for our initial meeting and routine follow-up post-completion of treatment for breast cancer.    1. Stage 0 right breast cancer:  Ms. Lakey is continuing to recover from definitive treatment for breast cancer. She will follow-up with Cira Rue, in August, 2023 with history and physical exam per surveillance protocol.   Her mammogram is due 06/2021; orders placed today. Today, a comprehensive survivorship care plan and treatment summary was reviewed with the patient today detailing her breast cancer diagnosis, treatment course, potential late/long-term effects of treatment, appropriate follow-up care with recommendations for the future, and patient education resources.  A copy of this summary, along with a letter will be sent to the patients primary care provider via mail/fax/In Basket message after todays visit.    2. Mild depression: I placed social work referral and suggested she reach out to her PCP for f/u.  She is taking Doxepin. Celexa was on her list, but as we reviewed her mediations in detail she did not have it and did not know what it was.   3. Bone health: We discussed bone health briefly.  I placed orders for a bone density test today as she has not had 1 of these.  4. Cancer screening:  Due to Ms. Nazareno's history and her age, she  should receive screening for skin cancers.  The information and recommendations are listed on the  patient's comprehensive care plan/treatment summary and were reviewed in detail with the patient.    5. Health maintenance and wellness promotion: Ms. Sieg was encouraged to consume 5-7 servings of fruits and vegetables per day. We reviewed the "Nutrition Rainbow" handout.  She was also encouraged to engage in moderate to vigorous exercise for 30 minutes per day most days of the week. We discussed the [employer] fitness program, which is designed for cancer survivors to help them become more physically fit after cancer treatments.  She was instructed to limit her alcohol consumption and continue to abstain from tobacco use.      6. Support services/counseling: It is not uncommon for this period of the patient's cancer care trajectory to be one of many emotions and stressors.     She was given information regarding our available services and encouraged to contact me with any questions or for help enrolling in any of our support group/programs.    Follow up instructions:    -Return to cancer center in August, 2023 -Mammogram due in January 2023 -Bone density in January 2023 -Follow up with surgery per Dr. Ardeen Fillers filled -She is welcome to return back to the Survivorship Clinic at any time; no additional follow-up needed at this time.  -Consider referral back to survivorship as a long-term survivor for continued surveillance  The patient was provided an opportunity to ask questions and all were answered. The patient agreed with the plan and demonstrated an understanding of the instructions.   The patient was advised to call back or seek an in-person evaluation if the symptoms worsen or if the condition fails to improve as anticipated.   I provided 35 minutes of non face-to-face telephone visit time during this encounter, and > 50% was spent counseling as documented under my assessment &  plan.  Wilber Bihari, NP 06/04/21 10:44 AM Medical Oncology and Hematology [facility] [place], [place] [address] Tel. [phone]    Fax. [phone]

## 2021-06-21 ENCOUNTER — Telehealth: Payer: Self-pay | Admitting: Pulmonary Disease

## 2021-06-21 MED ORDER — BENZONATATE 200 MG PO CAPS: 200.0000 mg | ORAL_CAPSULE | Freq: Three times a day (TID) | ORAL | 1 refills | Status: DC | PRN

## 2021-06-21 NOTE — Telephone Encounter (Signed)
Called and spoke with pt who states that she is coughing a lot again. States that she does not have any pain or discomfort, just states that she has been coughing again.  Pt is wanting to know if she can have a refill of her benzonatate as that helped in the past when she had the cough.  With Dr. Halford Chessman being off, sending this to provider of the day to see if they are okay with [place] refilling pt's benzonatate. Tammy, please advise.

## 2021-06-21 NOTE — Telephone Encounter (Signed)
Chest x-ray February 12, 2021 showed no acute process. Office notes reviewed from February 12, 2021 with recommendations to use Tessalon as needed Will refill in Dr. Juanetta Gosling absence   That is fine for her to have a refill of Tessalon she can have #45, 1 refill   Advise if cough is not improving or worsens will need sooner follow-up visit  Please contact office for sooner follow up if symptoms do not improve or worsen or seek emergency care

## 2021-06-21 NOTE — Telephone Encounter (Signed)
States tesslaon was prescribed for 200 but that is on backorder. Can they do 100 and double instead? Please advise.    Dr. Halford Chessman please advise

## 2021-06-21 NOTE — Telephone Encounter (Signed)
Rx for pt's benzonatate has been sent to pharmacy for pt. Called and spoke with pt letting her know this had been done and she verbalized understanding. Nothing further needed.

## 2021-06-24 NOTE — Telephone Encounter (Signed)
Called and spoke to Mesa from [facility] in [place]. They are going to give the patient Tessalon 100 two pills tid prn cough. Nothing further needed.

## 2021-06-24 NOTE — Telephone Encounter (Signed)
Okay to change order to tessalon 100 two pills tid prn cough.  Send 180 pills with 1 refill.

## 2021-07-09 ENCOUNTER — Ambulatory Visit: Payer: PPO

## 2021-07-22 NOTE — Progress Notes (Signed)
Primary Care Physician: Lucianne Lei, MD  Primary Gastroenterologist:  Elon Alas. Abbey Chatters, DO   Chief Complaint  Patient presents with   Constipation    Occ has to disimpact herself. Has bm every 3-4 days   Gastroesophageal Reflux    HPI: Nancy Liu is a 81 y.o. female here for follow up of IDA s/p full evaluation and likely due to small bowel AVMs, constipation, GERD. Last seen 03/2021 (telephone visit). Diagnosed with right breast DCIS and underwent right total mastectomy 01/2021.   Patient states she has a BM every 3-4 days.  Sometimes she feels a lot of pressure in her rectum and at that point she knows she needs help, disimpaction.  She has had to disimpact herself a couple times per month.  Stool be hard when this happens. No melena, brbpr. Stools are very dark. Off iron for several weeks. No Pepto. Takes miralax or Senokot-S few times per month.   EGD and colonoscopy in October 2017 revealing many non-bleeding gastric ulcers, gastroduodenitis (no H. 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 Diverticulosis and non-bleeding hemorrhoids. Repeat EGD in January 2018 showed erosive gastritis but ulcers had healed. Due to reports of melena, she underwent EGD Sept 2021 focal inflammation in the gastric antrum consistent with gastritis on biopsies. No H.pylori. CT in February 2021 with questionable fullness in the pancreatic head.  6 mm cyst in the pancreatic body.  MRCP negative for mass or ductal dilation. To wrap up IDA evaluation, she completed capsule study in May 2022: Few gastric and small bowel erosions and possible small bowel AVM as outlined above. No masses. No active bleeding lesions noted during this study.    Current Outpatient Medications  Medication Sig Dispense Refill   acetaminophen (TYLENOL) 500 MG tablet Take 1,000 mg by mouth every 6 (six) hours as needed for moderate pain or headache.     albuterol (VENTOLIN HFA) 108 (90 Base) MCG/ACT inhaler  Inhale 2 puffs into the lungs every 6 (six) hours as needed for wheezing or shortness of breath. 8 g 2   amLODipine (NORVASC) 10 MG tablet TAKE ONE TABLET BY MOUTH EVERYDAY AT BEDTIME 90 tablet 3   aspirin EC 81 MG tablet Take 81 mg by mouth daily. Swallow whole.     benzonatate (TESSALON) 200 MG capsule Take 1 capsule (200 mg total) by mouth 3 (three) times daily as needed for cough. 45 capsule 1   carvedilol (COREG) 6.25 MG tablet TAKE ONE TABLET BY MOUTH BEFORE BREAKFAST and TAKE ONE TABLET BY MOUTH EVERYDAY AT BEDTIME 180 tablet 3   cetirizine (ZYRTEC) 5 MG tablet Take 1 tablet (5 mg total) by mouth daily. (Patient taking differently: Take 5 mg by mouth daily as needed for allergies.) 30 tablet 0   doxepin (SINEQUAN) 10 MG capsule Take 10 mg by mouth at bedtime as needed.     irbesartan-hydrochlorothiazide (AVALIDE) 300-12.5 MG tablet Take 1 tablet by mouth daily before breakfast.     lansoprazole (PREVACID) 30 MG capsule TAKE ONE CAPSULE BY MOUTH BEFORE BREAKFAST and TAKE ONE CAPSULE BY MOUTH EVERYDAY AT BEDTIME 180 capsule 1   Latanoprostene Bunod 0.024 % SOLN Place 1 drop into both eyes once a week.     metFORMIN (GLUCOPHAGE) 1000 MG tablet Take 500 mg by mouth at bedtime.     nitroGLYCERIN (NITROSTAT) 0.4 MG SL tablet Place 1 tablet (0.4 mg total) under the tongue every 5 (five) minutes as needed for chest pain. 25  tablet 3   rosuvastatin (CRESTOR) 20 MG tablet TAKE ONE TABLET BY MOUTH EVERYDAY AT BEDTIME 90 tablet 3   sennosides-docusate sodium (SENOKOT-S) 8.6-50 MG tablet Take 2 tablets by mouth daily as needed for constipation.     triamcinolone ointment (KENALOG) 0.5 % Apply 1 application topically 2 (two) times daily as needed (rash).     Turmeric 500 MG CAPS Take 500 mg by mouth daily.     fluticasone (FLONASE) 50 MCG/ACT nasal spray Place 2 sprays into both nostrils daily. (Patient not taking: Reported on 07/23/2021)     No current facility-administered medications for this visit.     Allergies as of 07/23/2021 - Review Complete 07/23/2021  Allergen Reaction Noted   Protonix [pantoprazole sodium]  03/27/2016    ROS:  General: Negative for anorexia, weight loss, fever, chills, fatigue, weakness. ENT: Negative for hoarseness, difficulty swallowing , nasal congestion. CV: Negative for chest pain, angina, palpitations, dyspnea on exertion, peripheral edema.  Respiratory: Negative for dyspnea at rest, dyspnea on exertion, cough, sputum, wheezing.  GI: See history of present illness. GU:  Negative for dysuria, hematuria, urinary incontinence, urinary frequency, nocturnal urination.  Endo: Negative for unusual weight change.    Physical Examination:   BP 130/83    Pulse 80    Temp (!) 97.1 F (36.2 C) (Temporal)    Ht 5' 2.5" (1.588 m)    Wt 157 lb 3.2 oz (71.3 kg)    BMI 28.29 kg/m   General: Well-nourished, well-developed in no acute distress.  Eyes: No icterus. Mouth: masked Lungs: Clear to auscultation bilaterally.  Heart: Regular rate and rhythm, no murmurs rubs or gallops.  Abdomen: Bowel sounds are normal, nontender, nondistended, no hepatosplenomegaly or masses, no abdominal bruits or hernia , no rebound or guarding.   Extremities: No lower extremity edema. No clubbing or deformities. Neuro: Alert and oriented x 4   Skin: Warm and dry, no jaundice.   Psych: Alert and cooperative, normal mood and affect.  Labs:  Lab Results  Component Value Date   CREATININE 1.22 (H) 01/23/2021   BUN 21 01/23/2021   NA 142 01/23/2021   K 4.2 01/23/2021   CL 101 01/23/2021   CO2 29 01/23/2021   Lab Results  Component Value Date   ALT 17 01/23/2021   AST 24 01/23/2021   ALKPHOS 65 01/23/2021   BILITOT 0.6 01/23/2021   Lab Results  Component Value Date   WBC 7.2 05/13/2021   HGB 12.5 05/13/2021   HCT 39.9 05/13/2021   MCV 83.0 05/13/2021   PLT 262 05/13/2021   Lab Results  Component Value Date   IRON 65 05/13/2021   TIBC 310 05/13/2021   FERRITIN 134  05/13/2021   Lab Results  Component Value Date   QRFXJOIT25 498 12/26/2020   Lab Results  Component Value Date   FOLATE 13.0 06/22/2020     Imaging Studies: No results found.   Assessment:  IDA: Complete evaluation as outlined above.  She has had gastric ulcers as well as AVMs.  Follows with hematology.  Recent labs with normal CBC, iron, ferritin, B12 and folate.  Continue to follow with hematology.  Constipation: Inadequately managed.  Occasionally disimpact herself.  Colonoscopy in 2017.  She needs more regular bowel regimen.  GERD: Well-controlled, continues Prevacid twice daily.  We will try to get her back to once daily if tolerated.  Weight loss: Fortunately her appetite has improved.  She has gained about 7 pounds.   Plan:  Miralax  17 grams daily until good regular BMs.  Then continue 17 g daily as needed.  She will reassess daily and if no bowel movement in 24 hours she will take a dose.  She can continue to use Senokot as on occasional basis if needed. She is to call if constipation does not improve. Decrease lansoprazole to once daily.  If any recurrent heartburn, abdominal pain, nausea she will go back to twice daily. If stools remain dark after management of constipation, she will let me know. We will follow-up labs are scheduled for April. Return to the office in 6 months or sooner if needed.

## 2021-07-23 ENCOUNTER — Encounter: Payer: Self-pay | Admitting: Gastroenterology

## 2021-07-23 ENCOUNTER — Other Ambulatory Visit: Payer: Self-pay

## 2021-07-23 ENCOUNTER — Ambulatory Visit: Payer: PPO | Admitting: Gastroenterology

## 2021-07-23 VITALS — BP 130/83 | HR 80 | Temp 97.1°F | Ht 62.5 in | Wt 157.2 lb

## 2021-07-23 DIAGNOSIS — K219 Gastro-esophageal reflux disease without esophagitis: Secondary | ICD-10-CM

## 2021-07-23 DIAGNOSIS — D500 Iron deficiency anemia secondary to blood loss (chronic): Secondary | ICD-10-CM | POA: Diagnosis not present

## 2021-07-23 DIAGNOSIS — K5900 Constipation, unspecified: Secondary | ICD-10-CM

## 2021-07-23 NOTE — Patient Instructions (Addendum)
Since her reflux is doing so well, lets drop your lansoprazole to once daily before breakfast.  If you have any recurrent heartburn on a regular basis, abdominal pain, nausea you can go back to twice daily. For constipation, please start taking MiraLAX 1 capful every day.  Continue taking a dose every day until you are having regular soft bowel movements.  At that point you can take as needed, you should evaluate on a daily basis whether or not you have had a good bowel movement, if not take a dose the next morning. You can continue to use Senokot-S on an occasional basis if needed. If your bowels continue to be difficult to move, please let me know and we will discuss other options. If your stool remains dark even after you are having regular BMs, please let me know. We will follow-up labs that are scheduled for April. Return to the office in 6 months or call sooner if needed.

## 2021-07-29 ENCOUNTER — Other Ambulatory Visit: Payer: Self-pay

## 2021-07-29 ENCOUNTER — Ambulatory Visit: Payer: PPO | Admitting: Pulmonary Disease

## 2021-07-29 ENCOUNTER — Encounter: Payer: Self-pay | Admitting: Pulmonary Disease

## 2021-07-29 VITALS — BP 122/78 | HR 94 | Temp 98.0°F | Ht 62.5 in | Wt 153.2 lb

## 2021-07-29 DIAGNOSIS — J479 Bronchiectasis, uncomplicated: Secondary | ICD-10-CM

## 2021-07-29 DIAGNOSIS — J301 Allergic rhinitis due to pollen: Secondary | ICD-10-CM | POA: Diagnosis not present

## 2021-07-29 DIAGNOSIS — Z8669 Personal history of other diseases of the nervous system and sense organs: Secondary | ICD-10-CM

## 2021-07-29 NOTE — Progress Notes (Signed)
Pulmonary, Critical Care, and Sleep Medicine  Chief Complaint  Patient presents with   Follow-up    Cough and SOB have improved since last OV.     Constitutional:  BP 122/78 (BP Location: Left Arm, Patient Position: Sitting)    Pulse 94    Temp 98 F (36.7 C) (Temporal)    Ht 5' 2.5" (1.588 m)    Wt 153 lb 3.2 oz (69.5 kg)    SpO2 100% Comment: ra   BMI 27.57 kg/m   Past Medical History:  Rt breast cancer, CAD, HTN, HLD, CVA, GERD, Thalassemia minor, GI bleed from AVM  Past Surgical History:  She  has a past surgical history that includes Vaginal hysterectomy (1980); Colonoscopy (2006); Inguinal hernia repair (1970s); Dilation and curettage of uterus; Colonoscopy (2000); Esophagogastroduodenoscopy (2000); Esophagogastroduodenoscopy (2006); Colonoscopy (N/A, 03/03/2016); Esophagogastroduodenoscopy (N/A, 03/03/2016); Esophagogastroduodenoscopy (N/A, 06/03/2016); Excision of keloid (Left, 03/26/2018); Esophagogastroduodenoscopy (egd) with propofol (N/A, 02/28/2020); biopsy (02/28/2020); Givens capsule study (N/A, 10/10/2020); and Total mastectomy (Right, 02/20/2021).  Brief Summary:  Nancy Liu is a 81 y.o. female former smoker with dyspnea in setting of bronchiectasis      Subjective:   She had Rt breast surgery.  This went well.  Breathing good.  Not having much cough.  Sleeping okay.  Keeps up with activity.  Has occasional throat irritation.  Sinuses okay.  Not having sputum, chest pain, or hemoptysis.  Hasn't need albuterol recently.  Uses cough drops sporadically.  Physical Exam:   Appearance - well kempt   ENMT - no sinus tenderness, no oral exudate, no LAN, Mallampati 3 airway, no stridor  Respiratory - equal breath sounds bilaterally, no wheezing or rales  CV - s1s2 regular rate and rhythm, no murmurs  Ext - no clubbing, no edema  Skin - no rashes  Psych - normal mood and affect     Pulmonary testing:  PFT 06/13/15 >> FEV1 1.83 (112%), FEV1% 85, TLC  3.82 (79%), DLCO 47% IgE 10/13/19 >> 16 IgG/IgA/IgM 10/13/19 >> 735/168/27 Serology 10/13/19 >> RF/ANA/ANCA negative PFT 11/08/19 >> FEV1 1.80 (118%), FEV1% 89, TLC 3.91 (81%), DLCO 77%  Chest Imaging:  HRCT chest 09/03/15 >> few scattered nodules up to 9 mm, mild GGO and minimal BTX with distortion in lingula and lower lobes from scarring HRCT chest 10/20/19 >> atherosclerosis, tubular BTX b/l lower lobes, scarring LLL and lingular, scattered nodules up to 9 mm stable since 2017/benign  Sleep Tests:  PSG 09/09/17 >> AHI 8.6, SpO2 low 87%  Cardiac Tests:  Echo 03/14/16 >> EF 55 to 60%, mod LVH, grade 1 DD  Social History:  She  reports that she quit smoking about 41 years ago. Her smoking use included cigarettes. She started smoking about 56 years ago. She has a 18.00 pack-year smoking history. She has never used smokeless tobacco. She reports that she does not drink alcohol and does not use drugs.  Family History:  Her family history includes Breast cancer (age of onset: 56) in her half-sister; Diabetes in her half-sister; Heart disease in her mother; Lung cancer in her mother; Multiple myeloma (age of onset: 40) in her nephew; Ovarian cancer in her mother.     Assessment/Plan:   Bronchiectasis, history of smoking and reflux. - prn albuterol, tessalon, mucinex  Allergic rhinitis with post nasal drip. - prn flonase, zyrtec  History of mild sleep apnea. - she doesn't feel like she has any issues with her sleep at this time  Time Spent Involved in Patient  Care on Day of Examination:  27 minutes  Follow up:   Patient Instructions  Follow up in 1 year  Medication List:   Allergies as of 07/29/2021       Reactions   Protonix [pantoprazole Sodium]    DRY LIPS        Medication List        Accurate as of July 29, 2021  9:57 AM. If you have any questions, ask your nurse or doctor.          acetaminophen 500 MG tablet Commonly known as: TYLENOL Take 1,000 mg by  mouth every 6 (six) hours as needed for moderate pain or headache.   albuterol 108 (90 Base) MCG/ACT inhaler Commonly known as: VENTOLIN HFA Inhale 2 puffs into the lungs every 6 (six) hours as needed for wheezing or shortness of breath.   amLODipine 10 MG tablet Commonly known as: NORVASC TAKE ONE TABLET BY MOUTH EVERYDAY AT BEDTIME   aspirin EC 81 MG tablet Take 81 mg by mouth daily. Swallow whole.   benzonatate 200 MG capsule Commonly known as: TESSALON Take 1 capsule (200 mg total) by mouth 3 (three) times daily as needed for cough.   carvedilol 6.25 MG tablet Commonly known as: COREG TAKE ONE TABLET BY MOUTH BEFORE BREAKFAST and TAKE ONE TABLET BY MOUTH EVERYDAY AT BEDTIME   cetirizine 5 MG tablet Commonly known as: ZYRTEC Take 1 tablet (5 mg total) by mouth daily. What changed:  when to take this reasons to take this   doxepin 10 MG capsule Commonly known as: SINEQUAN Take 10 mg by mouth at bedtime as needed.   irbesartan-hydrochlorothiazide 300-12.5 MG tablet Commonly known as: AVALIDE Take 1 tablet by mouth daily before breakfast.   lansoprazole 30 MG capsule Commonly known as: PREVACID TAKE ONE CAPSULE BY MOUTH BEFORE BREAKFAST and TAKE ONE CAPSULE BY MOUTH EVERYDAY AT BEDTIME   Latanoprostene Bunod 0.024 % Soln Place 1 drop into both eyes once a week.   metFORMIN 1000 MG tablet Commonly known as: GLUCOPHAGE Take 500 mg by mouth at bedtime.   nitroGLYCERIN 0.4 MG SL tablet Commonly known as: NITROSTAT Place 1 tablet (0.4 mg total) under the tongue every 5 (five) minutes as needed for chest pain.   rosuvastatin 20 MG tablet Commonly known as: CRESTOR TAKE ONE TABLET BY MOUTH EVERYDAY AT BEDTIME   sennosides-docusate sodium 8.6-50 MG tablet Commonly known as: SENOKOT-S Take 2 tablets by mouth daily as needed for constipation.   triamcinolone ointment 0.5 % Commonly known as: KENALOG Apply 1 application topically 2 (two) times daily as needed  (rash).   Turmeric 500 MG Caps Take 500 mg by mouth daily.        Signature:  Chesley Mires, MD [place] Pager - [phone] 07/29/2021, 9:57 AM

## 2021-07-29 NOTE — Patient Instructions (Signed)
Follow up in 1 year.

## 2021-07-31 ENCOUNTER — Other Ambulatory Visit (HOSPITAL_COMMUNITY): Payer: Self-pay | Admitting: Hematology

## 2021-07-31 DIAGNOSIS — D509 Iron deficiency anemia, unspecified: Secondary | ICD-10-CM

## 2021-08-01 ENCOUNTER — Telehealth: Payer: Self-pay | Admitting: Internal Medicine

## 2021-08-01 NOTE — Telephone Encounter (Signed)
Vm full unable to leave message

## 2021-08-01 NOTE — Telephone Encounter (Signed)
[phone] Nancy Liu at the [facility] needs to speak to a nurse about the patient medications  ?

## 2021-08-05 NOTE — Telephone Encounter (Signed)
Lmom for nurse to call me back if still needing to speak with me.  ?

## 2021-08-12 ENCOUNTER — Telehealth: Payer: Self-pay | Admitting: Internal Medicine

## 2021-08-12 ENCOUNTER — Telehealth: Payer: Self-pay

## 2021-08-12 MED ORDER — LANSOPRAZOLE 30 MG PO CPDR: DELAYED_RELEASE_CAPSULE | ORAL | 3 refills | Status: DC

## 2021-08-12 NOTE — Telephone Encounter (Signed)
Informed pt that last rx of prevacid was [facility] in Dec 2022.  ?

## 2021-08-12 NOTE — Addendum Note (Signed)
Addended by: Mahala Menghini on: 08/12/2021 01:44 PM ? ? Modules accepted: Orders ? ?

## 2021-08-12 NOTE — Telephone Encounter (Signed)
[facility] is requesting refills and updated rx for lansoprazole 30 mg once daily. Pt's last office visit was 07/23/21. ?

## 2021-08-12 NOTE — Telephone Encounter (Signed)
Patient needs her prescriptions sent to [facility].  She thinks they were sent to the wrong place  ?

## 2021-08-15 DIAGNOSIS — E1169 Type 2 diabetes mellitus with other specified complication: Secondary | ICD-10-CM | POA: Diagnosis not present

## 2021-08-15 DIAGNOSIS — I10 Essential (primary) hypertension: Secondary | ICD-10-CM | POA: Diagnosis not present

## 2021-08-15 DIAGNOSIS — E559 Vitamin D deficiency, unspecified: Secondary | ICD-10-CM | POA: Diagnosis not present

## 2021-08-15 DIAGNOSIS — R634 Abnormal weight loss: Secondary | ICD-10-CM | POA: Diagnosis not present

## 2021-08-15 DIAGNOSIS — I2510 Atherosclerotic heart disease of native coronary artery without angina pectoris: Secondary | ICD-10-CM | POA: Diagnosis not present

## 2021-08-15 DIAGNOSIS — D0511 Intraductal carcinoma in situ of right breast: Secondary | ICD-10-CM | POA: Diagnosis not present

## 2021-08-15 DIAGNOSIS — Z6824 Body mass index (BMI) 24.0-24.9, adult: Secondary | ICD-10-CM | POA: Diagnosis not present

## 2021-08-21 ENCOUNTER — Other Ambulatory Visit: Payer: Self-pay | Admitting: Adult Health

## 2021-08-26 ENCOUNTER — Telehealth: Payer: Self-pay

## 2021-08-26 MED ORDER — CEFUROXIME AXETIL 500 MG PO TABS: 500.0000 mg | ORAL_TABLET | Freq: Two times a day (BID) | ORAL | 0 refills | Status: AC

## 2021-08-26 NOTE — Telephone Encounter (Signed)
Called and gave patient recs and verified pharmacy. Nothing further needed ?

## 2021-08-26 NOTE — Telephone Encounter (Signed)
Primary Pulmonologist: Dr. Halford Chessman   ?Last office visit and with whom: Dr. Halford Chessman 07/29/2021  ?What do we see them for (pulmonary problems): Bronchiectasis/ Hx of OSA/ Allergic rhinitis  ?Last OV assessment/plan: see below  ? ?Was appointment offered to patient (explain)?  No none available  ? ? ?Reason for call: Patient came into office asking for Dr. Halford Chessman to call in Cetrizine to [facility] in [place]. She states she believes she has a sinus infection. Low grade fevers of 99.9, cough, runny nose, eye drainage since last Wednesday.  ?Has not taken a flu or covid test  ? ?Dr. Halford Chessman please advise  ? ?Allergies  ?Allergen Reactions  ? Protonix [Pantoprazole Sodium]   ?  DRY LIPS  ? ? ?Immunization History  ?Administered Date(s) Administered  ? Fluad Quad(high Dose 65+) 05/02/2019, 02/21/2021  ? Influenza, High Dose Seasonal PF 05/06/2018  ? Influenza-Unspecified 03/16/2016  ? Moderna Sars-Covid-2 Vaccination 08/02/2019, 08/25/2019  ?  ?Assessment/Plan:  ?  ?Bronchiectasis, history of smoking and reflux. ?- prn albuterol, tessalon, mucinex ?  ?Allergic rhinitis with post nasal drip. ?- prn flonase, zyrtec ?  ?History of mild sleep apnea. ?- she doesn't feel like she has any issues with her sleep at this time ?  ?Time Spent Involved in Patient Care on Day of Examination:  ?27 minutes ?  ?Follow up:  ?  ?Patient Instructions  ?Follow up in 1 year ?  ?Medication List:  ?  ?Allergies as of 07/29/2021   ?  ?    Reactions  ?  Protonix [pantoprazole Sodium]    ?  DRY LIPS  ?  ?   ?  ?   ?Medication List  ?   ?  ?   ? Accurate as of July 29, 2021  9:57 AM. If you have any questions, ask your nurse or doctor.  ?  ?   ?  ?   ?  ?acetaminophen 500 MG tablet ?Commonly known as: TYLENOL ?Take 1,000 mg by mouth every 6 (six) hours as needed for moderate pain or headache. ?   ?albuterol 108 (90 Base) MCG/ACT inhaler ?Commonly known as: VENTOLIN HFA ?Inhale 2 puffs into the lungs every 6 (six) hours as needed for wheezing or  shortness of breath. ?   ?amLODipine 10 MG tablet ?Commonly known as: NORVASC ?TAKE ONE TABLET BY MOUTH EVERYDAY AT BEDTIME ?   ?aspirin EC 81 MG tablet ?Take 81 mg by mouth daily. Swallow whole. ?   ?benzonatate 200 MG capsule ?Commonly known as: TESSALON ?Take 1 capsule (200 mg total) by mouth 3 (three) times daily as needed for cough. ?   ?carvedilol 6.25 MG tablet ?Commonly known as: COREG ?TAKE ONE TABLET BY MOUTH BEFORE BREAKFAST and TAKE ONE TABLET BY MOUTH EVERYDAY AT BEDTIME ?   ?cetirizine 5 MG tablet ?Commonly known as: ZYRTEC ?Take 1 tablet (5 mg total) by mouth daily. ?What changed:  ?when to take this ?reasons to take this ?   ?doxepin 10 MG capsule ?Commonly known as: SINEQUAN ?Take 10 mg by mouth at bedtime as needed. ?   ?irbesartan-hydrochlorothiazide 300-12.5 MG tablet ?Commonly known as: AVALIDE ?Take 1 tablet by mouth daily before breakfast. ?   ?lansoprazole 30 MG capsule ?Commonly known as: PREVACID ?TAKE ONE CAPSULE BY MOUTH BEFORE BREAKFAST and TAKE ONE CAPSULE BY MOUTH EVERYDAY AT BEDTIME ?   ?Latanoprostene Bunod 0.024 % Soln ?Place 1 drop into both eyes once a week. ?   ?metFORMIN 1000 MG tablet ?Commonly known  as: GLUCOPHAGE ?Take 500 mg by mouth at bedtime. ?   ?nitroGLYCERIN 0.4 MG SL tablet ?Commonly known as: NITROSTAT ?Place 1 tablet (0.4 mg total) under the tongue every 5 (five) minutes as needed for chest pain. ?   ?rosuvastatin 20 MG tablet ?Commonly known as: CRESTOR ?TAKE ONE TABLET BY MOUTH EVERYDAY AT BEDTIME ?   ?sennosides-docusate sodium 8.6-50 MG tablet ?Commonly known as: SENOKOT-S ?Take 2 tablets by mouth daily as needed for constipation. ?   ?triamcinolone ointment 0.5 % ?Commonly known as: KENALOG ?Apply 1 application topically 2 (two) times daily as needed (rash). ?   ?Turmeric 500 MG Caps ?Take 500 mg by mouth daily. ?   ?  ?   ? ?

## 2021-08-26 NOTE — Telephone Encounter (Signed)
Please send script for cefuroxime 500 mg bid for 7 days.  She should call to schedule ROV if not improved. ?

## 2021-08-30 DIAGNOSIS — I10 Essential (primary) hypertension: Secondary | ICD-10-CM | POA: Diagnosis not present

## 2021-08-30 DIAGNOSIS — E785 Hyperlipidemia, unspecified: Secondary | ICD-10-CM | POA: Diagnosis not present

## 2021-09-09 DIAGNOSIS — I10 Essential (primary) hypertension: Secondary | ICD-10-CM | POA: Diagnosis not present

## 2021-09-09 DIAGNOSIS — R600 Localized edema: Secondary | ICD-10-CM | POA: Diagnosis not present

## 2021-09-09 DIAGNOSIS — E1169 Type 2 diabetes mellitus with other specified complication: Secondary | ICD-10-CM | POA: Diagnosis not present

## 2021-09-12 ENCOUNTER — Ambulatory Visit
Admission: RE | Admit: 2021-09-12 | Discharge: 2021-09-12 | Disposition: A | Discharge status: Home / Self Care | Payer: PPO | Source: Ambulatory Visit | Attending: Adult Health | Admitting: Adult Health

## 2021-09-12 DIAGNOSIS — M85851 Other specified disorders of bone density and structure, right thigh: Secondary | ICD-10-CM | POA: Diagnosis not present

## 2021-09-12 DIAGNOSIS — D0511 Intraductal carcinoma in situ of right breast: Secondary | ICD-10-CM

## 2021-09-12 DIAGNOSIS — Z1231 Encounter for screening mammogram for malignant neoplasm of breast: Secondary | ICD-10-CM | POA: Diagnosis not present

## 2021-09-12 DIAGNOSIS — Z780 Asymptomatic menopausal state: Secondary | ICD-10-CM | POA: Diagnosis not present

## 2021-09-12 DIAGNOSIS — E2839 Other primary ovarian failure: Secondary | ICD-10-CM

## 2021-09-13 ENCOUNTER — Inpatient Hospital Stay (HOSPITAL_COMMUNITY): Payer: PPO | Attending: Hematology

## 2021-09-13 DIAGNOSIS — Z7984 Long term (current) use of oral hypoglycemic drugs: Secondary | ICD-10-CM | POA: Insufficient documentation

## 2021-09-13 DIAGNOSIS — Z9011 Acquired absence of right breast and nipple: Secondary | ICD-10-CM | POA: Insufficient documentation

## 2021-09-13 DIAGNOSIS — D500 Iron deficiency anemia secondary to blood loss (chronic): Secondary | ICD-10-CM

## 2021-09-13 DIAGNOSIS — Z86000 Personal history of in-situ neoplasm of breast: Secondary | ICD-10-CM | POA: Diagnosis not present

## 2021-09-13 DIAGNOSIS — D509 Iron deficiency anemia, unspecified: Secondary | ICD-10-CM | POA: Insufficient documentation

## 2021-09-13 DIAGNOSIS — E559 Vitamin D deficiency, unspecified: Secondary | ICD-10-CM | POA: Insufficient documentation

## 2021-09-13 DIAGNOSIS — Z7982 Long term (current) use of aspirin: Secondary | ICD-10-CM | POA: Diagnosis not present

## 2021-09-13 DIAGNOSIS — Z79899 Other long term (current) drug therapy: Secondary | ICD-10-CM | POA: Insufficient documentation

## 2021-09-13 DIAGNOSIS — Z87891 Personal history of nicotine dependence: Secondary | ICD-10-CM | POA: Insufficient documentation

## 2021-09-13 LAB — CBC WITH DIFFERENTIAL/PLATELET
Abs Immature Granulocytes: 0.05 10*3/uL (ref 0.00–0.07)
Basophils Absolute: 0 10*3/uL (ref 0.0–0.1)
Basophils Relative: 0 %
Eosinophils Absolute: 0.2 10*3/uL (ref 0.0–0.5)
Eosinophils Relative: 2 %
HCT: 41.9 % (ref 36.0–46.0)
Hemoglobin: 12.7 g/dL (ref 12.0–15.0)
Immature Granulocytes: 1 %
Lymphocytes Relative: 14 %
Lymphs Abs: 1.5 10*3/uL (ref 0.7–4.0)
MCH: 24.4 pg — ABNORMAL LOW (ref 26.0–34.0)
MCHC: 30.3 g/dL (ref 30.0–36.0)
MCV: 80.6 fL (ref 80.0–100.0)
Monocytes Absolute: 0.8 10*3/uL (ref 0.1–1.0)
Monocytes Relative: 8 %
Neutro Abs: 7.9 10*3/uL — ABNORMAL HIGH (ref 1.7–7.7)
Neutrophils Relative %: 75 %
Platelets: 354 10*3/uL (ref 150–400)
RBC: 5.2 MIL/uL — ABNORMAL HIGH (ref 3.87–5.11)
RDW: 15.8 % — ABNORMAL HIGH (ref 11.5–15.5)
WBC: 10.5 10*3/uL (ref 4.0–10.5)
nRBC: 0 % (ref 0.0–0.2)

## 2021-09-13 LAB — IRON AND TIBC
Iron: 61 ug/dL (ref 28–170)
Saturation Ratios: 20 % (ref 10.4–31.8)
TIBC: 307 ug/dL (ref 250–450)
UIBC: 246 ug/dL

## 2021-09-13 LAB — FERRITIN: Ferritin: 115 ng/mL (ref 11–307)

## 2021-09-13 LAB — VITAMIN D 25 HYDROXY (VIT D DEFICIENCY, FRACTURES): Vit D, 25-Hydroxy: 37.47 ng/mL (ref 30–100)

## 2021-09-16 ENCOUNTER — Ambulatory Visit: Payer: PPO | Admitting: Cardiology

## 2021-09-16 ENCOUNTER — Encounter: Payer: Self-pay | Admitting: *Deleted

## 2021-09-16 ENCOUNTER — Encounter: Payer: Self-pay | Admitting: Cardiology

## 2021-09-16 ENCOUNTER — Telehealth: Payer: Self-pay

## 2021-09-16 VITALS — BP 114/70 | HR 76 | Ht 62.5 in | Wt 153.8 lb

## 2021-09-16 DIAGNOSIS — R011 Cardiac murmur, unspecified: Secondary | ICD-10-CM

## 2021-09-16 DIAGNOSIS — E782 Mixed hyperlipidemia: Secondary | ICD-10-CM

## 2021-09-16 DIAGNOSIS — I10 Essential (primary) hypertension: Secondary | ICD-10-CM

## 2021-09-16 DIAGNOSIS — I2510 Atherosclerotic heart disease of native coronary artery without angina pectoris: Secondary | ICD-10-CM | POA: Diagnosis not present

## 2021-09-16 NOTE — Telephone Encounter (Signed)
Called and spoke with pt and her daughter - informed of bone density showing osteopenia, pt aware of weight bearing exercises, vit d and calcium.  Pt will call if there are questions or concerns in the future.  ?

## 2021-09-16 NOTE — Patient Instructions (Signed)
Medication Instructions:  ?Continue all current medications. ? ?Labwork: ?none ? ?Testing/Procedures: ?Your physician has requested that you have an echocardiogram. Echocardiography is a painless test that uses sound waves to create images of your heart. It provides your doctor with information about the size and shape of your heart and how well your heart?s chambers and valves are working. This procedure takes approximately one hour. There are no restrictions for this procedure. ?Office will contact with results via phone or letter.    ? ?Follow-Up: ?Your physician wants you to follow up in:  1 year.  You should receive a call from the office when due.  If you don't receive this call, please call our office to schedule the follow up appointment   ? ?Any Other Special Instructions Will Be Listed Below (If Applicable). ? ? ?If you need a refill on your cardiac medications before your next appointment, please call your pharmacy. ? ?

## 2021-09-16 NOTE — Telephone Encounter (Signed)
-----   Message from Gardenia Phlegm, NP sent at 09/15/2021  9:45 PM EDT ----- ?Mild osteopenia.  Please let patient know. ?----- Message ----- ?From: Interface, Rad Results In ?Sent: 09/12/2021   2:49 PM EDT ?To: Gardenia Phlegm, NP ? ? ?

## 2021-09-16 NOTE — Progress Notes (Signed)
? ? ? ?Clinical Summary ?Ms. Drohan is a 81 y.o.female seen today for follow up of the following medical problems. ?  ?1. CAD   ?- prior BMS to RCA in 2001 at [facility]   ?- 02/2011 MPI no ischemia   ?- echo 04/2013 showed LVEF 60-65% with grade I diastolic dysfunction. ?- 03/2020 nuclear stress no ischemia ?  ? ?- no specific ches tpains.  ?- compliant with meds ?- compliant with meds ?  ?  ?2. Fatigue ?- fatigue with walking. Generalized fatigue, no specific sob/doe.  ?- chronic stable symptoms ?  ?  ?3. HTN   ?- she is compliant with mecds ?  ?4. Hyperlpidiemia    ?- she is on statin ? ?08/2020 TC 123 HDL 51 TG 88 LDL 55 ?- labs followed by pcp ?  ?5. SOB/Abnormal PFTs/Bronchiectasis ?- former smoker x 30-35 years. ?- PFTs Jan 2017 with severely redcued DLCO ? ?- now follwoed by Dr Halford Chessman, ?  ?6. Anemia ?-followed by hematology ?  ?  ?  ?  ?SH: has 3 children. 2 grandchilren, one is a Marine scientist working at childrens hospital in [place]. Doristine Devoid grandbaby is on the way in November.  ?  ? ? ?Past Medical History:  ?Diagnosis Date  ? Arteriosclerotic cardiovascular disease (ASCVD) 2001  ? Bare-metal stent placed in the right coronary artery in 12/01; residual 50% lesion of the first diagonal and mid LAD  ? Breast cancer ([place])   ? Cerebrovascular disease   ? COPD (chronic obstructive pulmonary disease) ([place])   ? Cyst, dermoid, arm, left 03/26/2018  ? Diabetes mellitus   ? excellent control with a low-dose of a single oral agent  ? Family history of breast cancer 01/23/2021  ? Family history of ovarian cancer 01/23/2021  ? GERD (gastroesophageal reflux disease)   ? with ulcers  ? History of right coronary artery stent placement 2000  ? Hyperlipidemia   ? Hypertension   ? Sleep apnea   ? Thalassemia minor   ? Tobacco abuse, in remission   ? 35 pack years; Quit in 1980  ? ? ? ?Allergies  ?Allergen Reactions  ? Protonix [Pantoprazole Sodium]   ?  DRY LIPS  ? ? ? ?Current Outpatient Medications  ?Medication Sig Dispense  Refill  ? acetaminophen (TYLENOL) 500 MG tablet Take 1,000 mg by mouth every 6 (six) hours as needed for moderate pain or headache.    ? albuterol (VENTOLIN HFA) 108 (90 Base) MCG/ACT inhaler Inhale 2 puffs into the lungs every 6 (six) hours as needed for wheezing or shortness of breath. 8 g 2  ? amLODipine (NORVASC) 10 MG tablet TAKE ONE TABLET BY MOUTH EVERYDAY AT BEDTIME 90 tablet 3  ? aspirin EC 81 MG tablet Take 81 mg by mouth daily. Swallow whole.    ? benzonatate (TESSALON) 200 MG capsule Take 1 capsule (200 mg total) by mouth 3 (three) times daily as needed for cough. 45 capsule 1  ? carvedilol (COREG) 6.25 MG tablet TAKE ONE TABLET BY MOUTH BEFORE BREAKFAST and TAKE ONE TABLET BY MOUTH EVERYDAY AT BEDTIME 180 tablet 3  ? cetirizine (ZYRTEC) 5 MG tablet Take 1 tablet (5 mg total) by mouth daily. (Patient taking differently: Take 5 mg by mouth daily as needed for allergies.) 30 tablet 0  ? doxepin (SINEQUAN) 10 MG capsule Take 10 mg by mouth at bedtime as needed.    ? irbesartan-hydrochlorothiazide (AVALIDE) 300-12.5 MG tablet Take 1 tablet by mouth daily before  breakfast.    ? lansoprazole (PREVACID) 30 MG capsule Take 30 mg daily before breakfast and you can take additional dose 76m before evening meal if needed. 180 capsule 3  ? Latanoprostene Bunod 0.024 % SOLN Place 1 drop into both eyes once a week.    ? metFORMIN (GLUCOPHAGE) 1000 MG tablet Take 500 mg by mouth at bedtime.    ? nitroGLYCERIN (NITROSTAT) 0.4 MG SL tablet Place 1 tablet (0.4 mg total) under the tongue every 5 (five) minutes as needed for chest pain. 25 tablet 3  ? rosuvastatin (CRESTOR) 20 MG tablet TAKE ONE TABLET BY MOUTH EVERYDAY AT BEDTIME 90 tablet 3  ? sennosides-docusate sodium (SENOKOT-S) 8.6-50 MG tablet Take 2 tablets by mouth daily as needed for constipation.    ? triamcinolone ointment (KENALOG) 0.5 % Apply 1 application topically 2 (two) times daily as needed (rash).    ? Turmeric 500 MG CAPS Take 500 mg by mouth daily.     ? Vitamin D, Ergocalciferol, (DRISDOL) 1.25 MG (50000 UNIT) CAPS capsule TAKE ONE CAPSULE BY MOUTH ONCE WEEKLY ON TUESDAY 16 capsule 3  ? ?No current facility-administered medications for this visit.  ? ? ? ?Past Surgical History:  ?Procedure Laterality Date  ? BIOPSY  02/28/2020  ? Procedure: BIOPSY;  Surgeon: CEloise Harman DO;  [facility];  Service: Endoscopy;;  ? COLONOSCOPY  2006  ? Dr. MCollene Mares normal  ? COLONOSCOPY  2000  ? Dr. MEverlean Cherry normal   ? COLONOSCOPY N/A 03/03/2016  ? Dr. FOneida Alar diverticulosis, non-bleeding hemorrhoids, redundant left colon.  ? DILATION AND CURETTAGE OF UTERUS    ? ESOPHAGOGASTRODUODENOSCOPY  2000  ? Dr. MEverlean Cherry gastritis   ? ESOPHAGOGASTRODUODENOSCOPY  2006  ? Dr. MCollene Mares small hiatal hernia, gastritis, negative H.pylori   ? ESOPHAGOGASTRODUODENOSCOPY N/A 03/03/2016  ? Procedure: ESOPHAGOGASTRODUODENOSCOPY (EGD);  Surgeon: SDanie Binder MD;  [facility];  Service: Endoscopy;  Laterality: N/A;  ? ESOPHAGOGASTRODUODENOSCOPY N/A 06/03/2016  ? Dr. FOneida Alar nonaggressive gastritis due to aspirin use. Previous ulcers had healed.  ? ESOPHAGOGASTRODUODENOSCOPY (EGD) WITH PROPOFOL N/A 02/28/2020  ? focal inflammation in the gastric antrum consistent with gastritis on biopsies. No H.pylori.   ? EXCISION OF KELOID Left 03/26/2018  ? Procedure: EXCISION OF LEFT ARM MASS;  Surgeon: IFanny Skates MD;  [place]  Service: General;  Laterality: Left;  ? GIVENS CAPSULE STUDY N/A 10/10/2020  ? Procedure: GIVENS CAPSULE STUDY;  Surgeon: CEloise Harman DO;  [facility];  Service: Endoscopy;  Laterality: N/A;  7:30am  ? INGUINAL HERNIA REPAIR  1970s  ? Right  ? TOTAL MASTECTOMY Right 02/20/2021  ? Procedure: RIGHT TOTAL MASTECTOMY;  Surgeon: WRolm Bookbinder MD;  [place] [facility]  Service: General;  Laterality: Right;  ? [place] ? Unilateral oophorectomy  ? ? ? ?Allergies  ?Allergen Reactions  ? Protonix [Pantoprazole  Sodium]   ?  DRY LIPS  ? ? ? ? ?Family History  ?Problem Relation Age of Onset  ? Heart disease Mother   ?     also hypertension and asthma  ? Lung cancer Mother   ?     dx 711s ? Ovarian cancer Mother   ?     dx before 538 ? Diabetes Half-Sister   ? Breast cancer Half-Sister 681 ? Multiple myeloma Nephew 53  ?     war-related exposures  ? Colon cancer Neg Hx   ? Colon  polyps Neg Hx   ? ? ? ?Social History ?Ms. Fuller reports that she quit smoking about 41 years ago. Her smoking use included cigarettes. She started smoking about 56 years ago. She has a 18.00 pack-year smoking history. She has never used smokeless tobacco. ?Ms. Domangue reports no history of alcohol use. ? ? ?Review of Systems ?CONSTITUTIONAL: No weight loss, fever, chills, weakness or fatigue.  ?HEENT: Eyes: No visual loss, blurred vision, double vision or yellow sclerae.No hearing loss, sneezing, congestion, runny nose or sore throat.  ?SKIN: No rash or itching.  ?CARDIOVASCULAR: per hpi ?RESPIRATORY: No shortness of breath, cough or sputum.  ?GASTROINTESTINAL: No anorexia, nausea, vomiting or diarrhea. No abdominal pain or blood.  ?GENITOURINARY: No burning on urination, no polyuria ?NEUROLOGICAL: No headache, dizziness, syncope, paralysis, ataxia, numbness or tingling in the extremities. No change in bowel or bladder control.  ?MUSCULOSKELETAL: No muscle, back pain, joint pain or stiffness.  ?LYMPHATICS: No enlarged nodes. No history of splenectomy.  ?PSYCHIATRIC: No history of depression or anxiety.  ?ENDOCRINOLOGIC: No reports of sweating, cold or heat intolerance. No polyuria or polydipsia.  ?. ? ? ?Physical Examination ?Today's Vitals  ? 09/16/21 1037  ?BP: 114/70  ?Pulse: 76  ?SpO2: 96%  ?Weight: 153 lb 12.8 oz (69.8 kg)  ?Height: 5' 2.5" (1.588 m)  ? ?Body mass index is 27.68 kg/m?. ? ?Gen: resting comfortably, no acute distress ?HEENT: no scleral icterus, pupils equal round and reactive, no palptable cervical adenopathy,  ?CV: RRR, 2/6  systolic murmur rusb, no jvd ?Resp: Clear to auscultation bilaterally ?GI: abdomen is soft, non-tender, non-distended, normal bowel sounds, no hepatosplenomegaly ?MSK: extremities are warm, no edema.  ?Skin: war

## 2021-09-19 ENCOUNTER — Ambulatory Visit (HOSPITAL_COMMUNITY): Payer: PPO | Admitting: Physician Assistant

## 2021-09-20 ENCOUNTER — Ambulatory Visit: Payer: PPO | Admitting: Cardiology

## 2021-09-20 ENCOUNTER — Ambulatory Visit (HOSPITAL_COMMUNITY): Payer: PPO | Admitting: Physician Assistant

## 2021-09-20 NOTE — Progress Notes (Signed)
? ?[facility] ?[address] ?[place], [place] [address] ? ? ?CLINIC:  ?Medical Oncology/Hematology ? ?PCP:  ?Lucianne Lei, MD ?[address] ?[place] [address] ?[phone] ? ? ?REASON FOR VISIT:  ?Follow-up for microcytic anemia ?  ?CURRENT THERAPY: Oral ferrous sulfate with IV iron as needed ?  ?INTERVAL HISTORY:  ?Nancy Liu 81 y.o. female returns for routine follow-up of her microcytic anemia.  She was last seen by Tarri Abernethy PA-C on 05/15/2021. ? ?At today's visit, she reports feeling fair.  She continues to follow with Dr. Burr Medico at [facility] for management of her right breast multifocal DCIS s/p right mastectomy. ? ?Patient reports that she has stopped taking her daily iron tablet, but is not sure when she was told to do this or by home.  She continues to have chronically dark bowel movements but denies any gross melena or hematochezia.  She reports fatigue with energy about 40%.  She has some dyspnea on exertion and lightheadedness.  She denies any pica, restless legs, headache, chest pain, or syncope. ? ?She has 40% energy and 100% appetite. She endorses that she is maintaining a stable weight. ? ? ?REVIEW OF SYSTEMS:  ?Review of Systems  ?Constitutional:  Positive for fatigue. Negative for appetite change, chills, diaphoresis, fever and unexpected weight change.  ?HENT:   Negative for lump/mass and nosebleeds.   ?Eyes:  Negative for eye problems.  ?Respiratory:  Positive for cough (allergies) and shortness of breath (iwth exertion). Negative for hemoptysis.   ?Cardiovascular:  Negative for chest pain, leg swelling and palpitations.  ?Gastrointestinal:  Positive for constipation. Negative for abdominal pain, blood in stool, diarrhea, nausea and vomiting.  ?Genitourinary:  Negative for hematuria.   ?Skin: Negative.   ?Neurological:  Positive for numbness. Negative for dizziness, headaches and light-headedness.  ?Hematological:  Does not bruise/bleed easily.   ?Psychiatric/Behavioral:  Positive for depression.    ? ? ?PAST MEDICAL/SURGICAL HISTORY:  ?Past Medical History:  ?Diagnosis Date  ? Arteriosclerotic cardiovascular disease (ASCVD) 2001  ? Bare-metal stent placed in the right coronary artery in 12/01; residual 50% lesion of the first diagonal and mid LAD  ? Breast cancer ([place])   ? Cerebrovascular disease   ? COPD (chronic obstructive pulmonary disease) ([place])   ? Cyst, dermoid, arm, left 03/26/2018  ? Diabetes mellitus   ? excellent control with a low-dose of a single oral agent  ? Family history of breast cancer 01/23/2021  ? Family history of ovarian cancer 01/23/2021  ? GERD (gastroesophageal reflux disease)   ? with ulcers  ? History of right coronary artery stent placement 2000  ? Hyperlipidemia   ? Hypertension   ? Sleep apnea   ? Thalassemia minor   ? Tobacco abuse, in remission   ? 35 pack years; Quit in 1980  ? ?Past Surgical History:  ?Procedure Laterality Date  ? BIOPSY  02/28/2020  ? Procedure: BIOPSY;  Surgeon: Eloise Harman, DO;  [facility];  Service: Endoscopy;;  ? COLONOSCOPY  2006  ? Dr. Collene Mares: normal  ? COLONOSCOPY  2000  ? Dr. Everlean Cherry: normal   ? COLONOSCOPY N/A 03/03/2016  ? Dr. Oneida Alar: diverticulosis, non-bleeding hemorrhoids, redundant left colon.  ? DILATION AND CURETTAGE OF UTERUS    ? ESOPHAGOGASTRODUODENOSCOPY  2000  ? Dr. Everlean Cherry: gastritis   ? ESOPHAGOGASTRODUODENOSCOPY  2006  ? Dr. Collene Mares: small hiatal hernia, gastritis, negative H.pylori   ? ESOPHAGOGASTRODUODENOSCOPY N/A 03/03/2016  ? Procedure:  ESOPHAGOGASTRODUODENOSCOPY (EGD);  Surgeon: Danie Binder, MD;  [facility];  Service: Endoscopy;  Laterality: N/A;  ? ESOPHAGOGASTRODUODENOSCOPY N/A 06/03/2016  ? Dr. Oneida Alar: nonaggressive gastritis due to aspirin use. Previous ulcers had healed.  ? ESOPHAGOGASTRODUODENOSCOPY (EGD) WITH PROPOFOL N/A 02/28/2020  ? focal inflammation in the gastric antrum consistent with gastritis on biopsies. No H.pylori.   ? EXCISION OF  KELOID Left 03/26/2018  ? Procedure: EXCISION OF LEFT ARM MASS;  Surgeon: Fanny Skates, MD;  Location: [facility];  Service: General;  Laterality: Left;  ? GIVENS CAPSULE STUDY N/A 10/10/2020  ? Procedure: GIVENS CAPSULE STUDY;  Surgeon: Eloise Harman, DO;  [facility];  Service: Endoscopy;  Laterality: N/A;  7:30am  ? INGUINAL HERNIA REPAIR  1970s  ? Right  ? TOTAL MASTECTOMY Right 02/20/2021  ? Procedure: RIGHT TOTAL MASTECTOMY;  Surgeon: Rolm Bookbinder, MD;  Location: [facility];  Service: General;  Laterality: Right;  ? [place]  ? Unilateral oophorectomy  ? ? ? ?SOCIAL HISTORY:  ?Social History  ? ?Socioeconomic History  ? Marital status: Divorced  ?  Spouse name: Not on file  ? Number of children: 3  ? Years of education: Not on file  ? Highest education level: Not on file  ?Occupational History  ? 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 Smoking status: Former  ?  Packs/day: 1.00  ?  Years: 18.00  ?  Pack years: 18.00  ?  Types: Cigarettes  ?  Start date: 06/02/1965  ?  Quit date: 06/09/1980  ?  Years since quitting: 41.3  ? Smokeless tobacco: Never  ?Vaping Use  ? Vaping Use: Never used  ?Substance and Sexual Activity  ? Alcohol use: No  ?  Alcohol/week: 0.0 standard drinks  ? Drug use: No  ? Sexual activity: Never  ?Other Topics Concern  ? Not on file  ?Social History Narrative  ? Lives with daughter   ? ?Social Determinants of Health  ? ?Financial Resource Strain: Low Risk   ? Difficulty of Paying Living Expenses: Not hard at all  ?Food Insecurity: No Food Insecurity  ? Worried About Charity fundraiser in the Last Year: Never true  ? Ran Out of Food in the Last Year: Never true  ?Transportation Needs: No Transportation Needs  ? Lack of Transportation (Medical): No  ? Lack of Transportation (Non-Medical): No  ?Physical Activity: Inactive  ? Days of Exercise per Week: 0 days  ? Minutes of Exercise per Session: 0 min  ?Stress: Stress Concern  Present  ? Feeling of Stress : To some extent  ?Social Connections: Moderately Isolated  ? Frequency of Communication with Friends and Family: More than three times a week  ? Frequency of Social Gatherings with Friends and Family: More than three times a week  ? Attends Religious Services: More than 4 times per year  ? Active Member of Clubs or Organizations: No  ? Attends Archivist Meetings: Never  ? Marital Status: Divorced  ?Intimate Partner Violence: Not At Risk  ? Fear of Current or Ex-Partner: No  ? Emotionally Abused: No  ? Physically Abused: No  ? Sexually Abused: No  ? ? ?FAMILY HISTORY:  ?Family History  ?Problem Relation Age of Onset  ? Heart disease Mother   ?     also hypertension and asthma  ? Lung cancer Mother   ?     dx 68s  ?  Ovarian cancer Mother   ?     dx before 59  ? Diabetes Half-Sister   ? Breast cancer Half-Sister 27  ? Multiple myeloma Nephew 53  ?     war-related exposures  ? Colon cancer Neg Hx   ? Colon polyps Neg Hx   ? ? ?CURRENT MEDICATIONS:  ?Outpatient Encounter Medications as of 09/23/2021  ?Medication Sig Note  ? acetaminophen (TYLENOL) 500 MG tablet Take 1,000 mg by mouth every 6 (six) hours as needed for moderate pain or headache.   ? albuterol (VENTOLIN HFA) 108 (90 Base) MCG/ACT inhaler Inhale 2 puffs into the lungs every 6 (six) hours as needed for wheezing or shortness of breath.   ? amLODipine (NORVASC) 10 MG tablet TAKE ONE TABLET BY MOUTH EVERYDAY AT BEDTIME   ? aspirin EC 81 MG tablet Take 81 mg by mouth daily. Swallow whole.   ? benzonatate (TESSALON) 200 MG capsule Take 1 capsule (200 mg total) by mouth 3 (three) times daily as needed for cough.   ? carvedilol (COREG) 6.25 MG tablet TAKE ONE TABLET BY MOUTH BEFORE BREAKFAST and TAKE ONE TABLET BY MOUTH EVERYDAY AT BEDTIME   ? cetirizine (ZYRTEC) 5 MG tablet Take 1 tablet (5 mg total) by mouth daily. (Patient taking differently: Take 5 mg by mouth daily as needed for allergies.)   ? doxepin (SINEQUAN) 10 MG  capsule Take 10 mg by mouth at bedtime as needed.   ? irbesartan-hydrochlorothiazide (AVALIDE) 300-12.5 MG tablet Take 1 tablet by mouth daily before breakfast.   ? lansoprazole (PREVACID) 30 MG capsule Take 30 mg d

## 2021-09-23 ENCOUNTER — Inpatient Hospital Stay (HOSPITAL_COMMUNITY): Payer: PPO | Admitting: Physician Assistant

## 2021-09-23 VITALS — BP 100/64 | HR 85 | Temp 97.0°F | Resp 18 | Ht 62.5 in | Wt 153.7 lb

## 2021-09-23 DIAGNOSIS — D500 Iron deficiency anemia secondary to blood loss (chronic): Secondary | ICD-10-CM | POA: Diagnosis not present

## 2021-09-23 DIAGNOSIS — E559 Vitamin D deficiency, unspecified: Secondary | ICD-10-CM | POA: Diagnosis not present

## 2021-09-23 DIAGNOSIS — D509 Iron deficiency anemia, unspecified: Secondary | ICD-10-CM | POA: Diagnosis not present

## 2021-09-23 NOTE — Patient Instructions (Signed)
[place] at [facility] ?Discharge Instructions ? ?You were seen today by Tarri Abernethy PA-C for your iron deficiency anemia. ? ?Your blood and iron levels look great!  You do not need any IV iron right now.   ? ?Your vitamin D level is normal, so you do not need to restart your vitamin D pill right now.  We will check your labs again at your next follow-up visit to see if you need to restart vitamin D. ? ?LABS: Return in 4 months for repeat labs ? ?FOLLOW-UP APPOINTMENT: Phone visit in 4 months, after labs ? ? ?Thank you for choosing [place] at [facility] to provide your oncology and hematology care.  To afford each patient quality time with our provider, please arrive at least 15 minutes before your scheduled appointment time.  ? ?If you have a lab appointment with the [facility] please come in thru the Main Entrance and check in at the main information desk. ? ?You need to re-schedule your appointment should you arrive 10 or more minutes late.  We strive to give you quality time with our providers, and arriving late affects you and other patients whose appointments are after yours.  Also, if you no show three or more times for appointments you may be dismissed from the clinic at the providers discretion.     ?Again, thank you for choosing [facility].  Our hope is that these requests will decrease the amount of time that you wait before being seen by our physicians.       ?_____________________________________________________________ ? ?Should you have questions after your visit to [facility], please contact our office at [phone] and follow the prompts.  Our office hours are 8:00 a.m. and 4:30 p.m. Monday - Friday.  Please note that voicemails left after 4:00 p.m. may not be returned until the following business day.  We are closed weekends and major holidays.  You do have access to a nurse 24-7, just call the main  number to the clinic [phone] and do not press any options, hold on the line and a nurse will answer the phone.   ? ?For prescription refill requests, have your pharmacy contact our office and allow 72 hours.   ? ?Due to Covid, you will need to wear a mask upon entering the hospital. If you do not have a mask, a mask will be given to you at the Main Entrance upon arrival. For doctor visits, patients may have 1 support person age 53 or older with them. For treatment visits, patients can not have anyone with them due to social distancing guidelines and our immunocompromised population.  ? ? ? ?

## 2021-09-26 ENCOUNTER — Telehealth (INDEPENDENT_AMBULATORY_CARE_PROVIDER_SITE_OTHER): Payer: Self-pay | Admitting: *Deleted

## 2021-09-26 NOTE — Telephone Encounter (Signed)
Patient called and would like the nurse to call her --she wanted Magda Paganini  ? ?Having some issues and needed to know what she should do. ? ?[phone] ?

## 2021-09-26 NOTE — Telephone Encounter (Signed)
Lmom for pt to return my call.  

## 2021-09-27 NOTE — Telephone Encounter (Signed)
Recommend holding miralax and senokot until resolves. Drink plenty of fluid to stay hydrated.  ?Needs ov as this is a new problem and have not seen pt in over two months.  ?

## 2021-09-27 NOTE — Telephone Encounter (Signed)
Pt states that she has had watery diarrhea for the past couple days. Pt has not tried taking anything otc, stated that she wanted to consult with [place] first. She has not been on any antibiotics or any new meds recently, and has not had a change in her diet. She stated that she goes 3 to 4 times and day. She has only been once today so far. Please advise.  ?

## 2021-09-27 NOTE — Telephone Encounter (Signed)
Pt was made aware and verbalized understanding.  

## 2021-09-30 NOTE — Progress Notes (Signed)
? ?GI Office Note   ? ?Referring Provider: Lucianne Lei, MD ?Primary Care Physician:  Lucianne Lei, MD ?Primary GI: Dr. Abbey Chatters ? ?Date:  10/01/2021  ?ID:  Nancy Liu, DOB 1940/08/06, MRN [id number] ? ? ?Chief Complaint  ? ?Chief Complaint  ?Patient presents with  ? Constipation  ?  First was constipation and took Miralax then diarrhea and would not stop   ? ? ? ?History of Present Illness  ?Nancy Liu is a 81 y.o. female with a history of ASCVD s/p bare-metal stent in RCA, breast cancer, cerebrovascular disease, COPD, diabetes, GERD, hyperlipidemia, hypertension, thalassemia minor, constipation presenting today with complaint of recent acute diarrhea.  ? ?Last EGD and colonoscopy October 2017.  EGD revealed nonbleeding gastric ulcers, gastroduodenitis, nonbleeding AVM in second portion of duodenum, biopsy negative for H. pylori.  Colonoscopy with diverticulosis and nonbleeding hemorrhoids.  EGD repeated in January 2018 with erosive esophagitis with healed ulcers.  She subsequently reported melena and underwent another EGD in September 2021 with focal inflammation in the gastric antrum consistent with gastritis on biopsy, again negative for H. pylori.  ? ?CT February 2021 with questionable fullness in the pancreatic head, 6 mm cyst in the pancreatic body.  MRCP negative for mass or ductal dilation. ? ?Capsule study completed May 2022 with few gastric and small bowel erosions and possible small bowel AVM without any active bleeding. ? ?Last seen in the office 07/23/2021 -patient reported a BM every 3 to 4 days with sometimes feeling a lot of pressure in her rectum.  She had reported having to disimpact herself a couple times per month due to hard stools.  She does report that stools were very dark.  She was previously on iron and was not taking any Pepto-Bismol.  Only taking MiraLAX or Senokot a few times a month.  She follows with hematology.  GERD was well controlled on Prevacid 30 mg twice daily but  advised to cut back to once daily to see if symptoms return.  She is advised to begin MiraLAX 1 capful daily until having regular BMs then could continue MiraLAX 1 capful daily as needed.  Subsequently instructed that if she has not had a bowel movement within 24 hours she should take a dose and continue Senokot as needed. ? ?Most recent labs with hemoglobin 12.7, MCV 80.6, platelets 354, iron 61, saturation 20%, ferritin 115. ? ?Patient called the office on 09/27/2021 reporting watery diarrhea for few days (no new medications, change in diet, or recent antibiotics).  Per Neil Crouch she was advised to hold MiraLAX and Senokot and drink plenty of fluids and follow-up in the office.  ? ?Today: ?Patient reports she began having diarrhea on the third day of taking MiraLAX for her constipation.  She reports that she was having multiple stools a day (does not remember how many).  This lasted for about a week or so.  She has not had any diarrhea since this past Saturday.  She reports only feeling fatigued.  She has not had any melena, hematochezia, abdominal pain.  She does report that her stools continue to be dark but this is chronic for her.  She reports she recently had some blood work done that was normal.  She denies any recent sick contacts.  She did report that she had a Z-Pak at some point before having diarrhea but cannot remember exactly when.  She reports she has been trying to eat and stay hydrated however she has lost a few pounds  due to lack of eating.  More so this has been due to being afraid to eat and it causing her to go to the bathroom, not from nausea.  Since this past Saturday she has not had a bowel movement.  She does report that she typically has chronic constipation.  When she began having diarrhea she stated that she stopped taking the MiraLAX and Senokot and has not started that. ? ? ?GERD - controlled on Prevacid. ? ?Past Medical History:  ?Diagnosis Date  ? Arteriosclerotic cardiovascular  disease (ASCVD) 2001  ? Bare-metal stent placed in the right coronary artery in 12/01; residual 50% lesion of the first diagonal and mid LAD  ? Breast cancer ([place])   ? Cerebrovascular disease   ? COPD (chronic obstructive pulmonary disease) ([place])   ? Cyst, dermoid, arm, left 03/26/2018  ? Diabetes mellitus   ? excellent control with a low-dose of a single oral agent  ? Family history of breast cancer 01/23/2021  ? Family history of ovarian cancer 01/23/2021  ? GERD (gastroesophageal reflux disease)   ? with ulcers  ? History of right coronary artery stent placement 2000  ? Hyperlipidemia   ? Hypertension   ? Sleep apnea   ? Thalassemia minor   ? Tobacco abuse, in remission   ? 35 pack years; Quit in 1980  ? ? ?Past Surgical History:  ?Procedure Laterality Date  ? BIOPSY  02/28/2020  ? Procedure: BIOPSY;  Surgeon: Eloise Harman, DO;  [facility];  Service: Endoscopy;;  ? COLONOSCOPY  2006  ? Dr. Collene Mares: normal  ? COLONOSCOPY  2000  ? Dr. Everlean Cherry: normal   ? COLONOSCOPY N/A 03/03/2016  ? Dr. Oneida Alar: diverticulosis, non-bleeding hemorrhoids, redundant left colon.  ? DILATION AND CURETTAGE OF UTERUS    ? ESOPHAGOGASTRODUODENOSCOPY  2000  ? Dr. Everlean Cherry: gastritis   ? ESOPHAGOGASTRODUODENOSCOPY  2006  ? Dr. Collene Mares: small hiatal hernia, gastritis, negative H.pylori   ? ESOPHAGOGASTRODUODENOSCOPY N/A 03/03/2016  ? Procedure: ESOPHAGOGASTRODUODENOSCOPY (EGD);  Surgeon: Danie Binder, MD;  [facility];  Service: Endoscopy;  Laterality: N/A;  ? ESOPHAGOGASTRODUODENOSCOPY N/A 06/03/2016  ? Dr. Oneida Alar: nonaggressive gastritis due to aspirin use. Previous ulcers had healed.  ? ESOPHAGOGASTRODUODENOSCOPY (EGD) WITH PROPOFOL N/A 02/28/2020  ? focal inflammation in the gastric antrum consistent with gastritis on biopsies. No H.pylori.   ? EXCISION OF KELOID Left 03/26/2018  ? Procedure: EXCISION OF LEFT ARM MASS;  Surgeon: Fanny Skates, MD;  Location: [facility];  Service: General;  Laterality: Left;   ? GIVENS CAPSULE STUDY N/A 10/10/2020  ? Procedure: GIVENS CAPSULE STUDY;  Surgeon: Eloise Harman, DO;  [facility];  Service: Endoscopy;  Laterality: N/A;  7:30am  ? INGUINAL HERNIA REPAIR  1970s  ? Right  ? TOTAL MASTECTOMY Right 02/20/2021  ? Procedure: RIGHT TOTAL MASTECTOMY;  Surgeon: Rolm Bookbinder, MD;  Location: [place];  Service: General;  Laterality: Right;  ? [place]  ? Unilateral oophorectomy  ? ? ?Current Outpatient Medications  ?Medication Sig Dispense Refill  ? acetaminophen (TYLENOL) 500 MG tablet Take 1,000 mg by mouth every 6 (six) hours as needed for moderate pain or headache.    ? amLODipine (NORVASC) 10 MG tablet TAKE ONE TABLET BY MOUTH EVERYDAY AT BEDTIME 90 tablet 3  ? aspirin EC 81 MG tablet Take 81 mg by mouth daily. Swallow whole.    ? benzonatate (TESSALON) 200 MG capsule Take 1 capsule (200 mg  total) by mouth 3 (three) times daily as needed for cough. 45 capsule 1  ? carvedilol (COREG) 6.25 MG tablet TAKE ONE TABLET BY MOUTH BEFORE BREAKFAST and TAKE ONE TABLET BY MOUTH EVERYDAY AT BEDTIME 180 tablet 3  ? doxepin (SINEQUAN) 10 MG capsule Take 10 mg by mouth at bedtime as needed.    ? irbesartan-hydrochlorothiazide (AVALIDE) 300-12.5 MG tablet Take 1 tablet by mouth daily before breakfast.    ? lansoprazole (PREVACID) 30 MG capsule Take 30 mg daily before breakfast and you can take additional dose '30mg'$  before evening meal if needed. 180 capsule 3  ? metFORMIN (GLUCOPHAGE) 1000 MG tablet Take 500 mg by mouth at bedtime.    ? rosuvastatin (CRESTOR) 20 MG tablet TAKE ONE TABLET BY MOUTH EVERYDAY AT BEDTIME 90 tablet 3  ? sennosides-docusate sodium (SENOKOT-S) 8.6-50 MG tablet Take 2 tablets by mouth daily as needed for constipation.    ? triamcinolone ointment (KENALOG) 0.5 % Apply 1 application topically 2 (two) times daily as needed (rash).    ? Turmeric 500 MG CAPS Take 500 mg by mouth daily.    ? albuterol (VENTOLIN HFA) 108 (90 Base) MCG/ACT inhaler  Inhale 2 puffs into the lungs every 6 (six) hours as needed for wheezing or shortness of breath. (Patient not taking: Reported on 10/01/2021) 8 g 2  ? cetirizine (ZYRTEC) 5 MG tablet Take 1 tablet (5 mg total

## 2021-10-01 ENCOUNTER — Ambulatory Visit: Payer: PPO | Admitting: Gastroenterology

## 2021-10-01 ENCOUNTER — Encounter: Payer: Self-pay | Admitting: Gastroenterology

## 2021-10-01 VITALS — BP 120/68 | HR 84 | Temp 97.5°F | Ht 62.0 in | Wt 148.6 lb

## 2021-10-01 DIAGNOSIS — K5900 Constipation, unspecified: Secondary | ICD-10-CM | POA: Diagnosis not present

## 2021-10-01 DIAGNOSIS — D500 Iron deficiency anemia secondary to blood loss (chronic): Secondary | ICD-10-CM | POA: Diagnosis not present

## 2021-10-01 DIAGNOSIS — K219 Gastro-esophageal reflux disease without esophagitis: Secondary | ICD-10-CM

## 2021-10-01 DIAGNOSIS — R197 Diarrhea, unspecified: Secondary | ICD-10-CM

## 2021-10-01 NOTE — Patient Instructions (Addendum)
It was a pleasure to meet you today! ? ?I would like for you to begin taking MiraLAX half capful every day.  If you have more than 2-3 watery/loose stools 2 days in a row in which she did take a half a cap every other day.  Lets stop the Senokot.  Goal is for good soft bowel movement every day or at least every other day at minimum.  ? ?I want you been taking Benefiber 2 teaspoons daily.  This may take a few weeks before you see a difference. ? ?For now for your constipation you may eat: ?Protein is important but it can also worsen constipation. Red meat is worse than lean meats such as turkey/chicken/fish. ?Eat foods that have a lot of fiber, such as: ?Fresh fruits and vegetables. ?Whole grains. ?Beans. ?Eat less of foods that are low in fiber and high in fat and sugar, such as: ?[place] fries. ?Hamburgers. ?Cookies. ?Candy. ?Soda. ?Drink enough fluid to keep your pee (urine) pale yellow. ? ?Diarrhea Diet: ?Eat bland, easy-to-digest foods in small amounts as you are able. These foods include: ?Bananas. ?Applesauce. ?Rice. ?Low-fat (lean) meats. ?Toast. ?Crackers. ?Avoid alcohol. ?Avoid spicy or fatty foods. ? ?It was a pleasure to see you today. I want to create trusting relationships with patients. If you receive a survey regarding your visit,  I greatly appreciate you taking time to fill this out on paper or through your MyChart. I value your feedback. ? ?Venetia Night, MSN, FNP-BC, AGACNP-BC ?[facility] ?

## 2021-10-03 DIAGNOSIS — R051 Acute cough: Secondary | ICD-10-CM | POA: Diagnosis not present

## 2021-10-03 DIAGNOSIS — I2510 Atherosclerotic heart disease of native coronary artery without angina pectoris: Secondary | ICD-10-CM | POA: Diagnosis not present

## 2021-10-03 DIAGNOSIS — E7800 Pure hypercholesterolemia, unspecified: Secondary | ICD-10-CM | POA: Diagnosis not present

## 2021-10-03 DIAGNOSIS — Z6827 Body mass index (BMI) 27.0-27.9, adult: Secondary | ICD-10-CM | POA: Diagnosis not present

## 2021-10-03 DIAGNOSIS — E559 Vitamin D deficiency, unspecified: Secondary | ICD-10-CM | POA: Diagnosis not present

## 2021-10-03 DIAGNOSIS — E1169 Type 2 diabetes mellitus with other specified complication: Secondary | ICD-10-CM | POA: Diagnosis not present

## 2021-10-03 DIAGNOSIS — J301 Allergic rhinitis due to pollen: Secondary | ICD-10-CM | POA: Diagnosis not present

## 2021-10-08 ENCOUNTER — Ambulatory Visit (INDEPENDENT_AMBULATORY_CARE_PROVIDER_SITE_OTHER): Payer: PPO

## 2021-10-08 DIAGNOSIS — R011 Cardiac murmur, unspecified: Secondary | ICD-10-CM

## 2021-10-08 LAB — ECHOCARDIOGRAM COMPLETE
AR max vel: 2.41 cm2
AV Area VTI: 2.37 cm2
AV Area mean vel: 2.26 cm2
AV Mean grad: 4 mmHg
AV Peak grad: 7.8 mmHg
Ao pk vel: 1.4 m/s
Area-P 1/2: 2.48 cm2
Calc EF: 65.2 %
S' Lateral: 1.82 cm
Single Plane A2C EF: 70.1 %
Single Plane A4C EF: 63.8 %

## 2021-10-14 DIAGNOSIS — R051 Acute cough: Secondary | ICD-10-CM | POA: Diagnosis not present

## 2021-10-14 DIAGNOSIS — I2510 Atherosclerotic heart disease of native coronary artery without angina pectoris: Secondary | ICD-10-CM | POA: Diagnosis not present

## 2021-10-14 DIAGNOSIS — Z6827 Body mass index (BMI) 27.0-27.9, adult: Secondary | ICD-10-CM | POA: Diagnosis not present

## 2021-10-14 DIAGNOSIS — J301 Allergic rhinitis due to pollen: Secondary | ICD-10-CM | POA: Diagnosis not present

## 2021-10-14 DIAGNOSIS — E1169 Type 2 diabetes mellitus with other specified complication: Secondary | ICD-10-CM | POA: Diagnosis not present

## 2021-10-14 DIAGNOSIS — E559 Vitamin D deficiency, unspecified: Secondary | ICD-10-CM | POA: Diagnosis not present

## 2021-10-16 DIAGNOSIS — E1169 Type 2 diabetes mellitus with other specified complication: Secondary | ICD-10-CM | POA: Diagnosis not present

## 2021-10-16 DIAGNOSIS — R5383 Other fatigue: Secondary | ICD-10-CM | POA: Diagnosis not present

## 2021-10-16 DIAGNOSIS — D563 Thalassemia minor: Secondary | ICD-10-CM | POA: Diagnosis not present

## 2021-10-16 DIAGNOSIS — K5900 Constipation, unspecified: Secondary | ICD-10-CM | POA: Diagnosis not present

## 2021-10-16 DIAGNOSIS — I2510 Atherosclerotic heart disease of native coronary artery without angina pectoris: Secondary | ICD-10-CM | POA: Diagnosis not present

## 2021-10-16 DIAGNOSIS — E785 Hyperlipidemia, unspecified: Secondary | ICD-10-CM | POA: Diagnosis not present

## 2021-10-16 DIAGNOSIS — F064 Anxiety disorder due to known physiological condition: Secondary | ICD-10-CM | POA: Diagnosis not present

## 2021-10-17 ENCOUNTER — Other Ambulatory Visit: Payer: PPO

## 2021-10-23 ENCOUNTER — Telehealth: Payer: Self-pay

## 2021-10-23 NOTE — Telephone Encounter (Signed)
-----   Message from Arnoldo Lenis, MD sent at 10/23/2021  3:28 PM EDT ----- Echo looks good, heart function is normal. Mild thickening of heart valve but its working fine, no further testing indicated.  Zandra Abts MD

## 2021-10-23 NOTE — Telephone Encounter (Signed)
Patient notified and verbalized understanding. Pt had no questions or concerns at this time 

## 2021-10-27 ENCOUNTER — Other Ambulatory Visit: Payer: Self-pay | Admitting: Cardiology

## 2021-10-30 DIAGNOSIS — E119 Type 2 diabetes mellitus without complications: Secondary | ICD-10-CM | POA: Diagnosis not present

## 2021-10-30 DIAGNOSIS — I10 Essential (primary) hypertension: Secondary | ICD-10-CM | POA: Diagnosis not present

## 2021-10-31 ENCOUNTER — Telehealth: Payer: Self-pay

## 2021-10-31 NOTE — Telephone Encounter (Signed)
Pt phoned and LMOVM that she has had diarrhea for the past 4 days and the pt wants a call.

## 2021-10-31 NOTE — Telephone Encounter (Signed)
noted 

## 2021-11-01 ENCOUNTER — Ambulatory Visit: Payer: PPO | Admitting: Gastroenterology

## 2021-11-01 ENCOUNTER — Encounter: Payer: Self-pay | Admitting: Gastroenterology

## 2021-11-01 VITALS — BP 124/62 | HR 88 | Temp 97.5°F | Ht 62.5 in | Wt 150.4 lb

## 2021-11-01 DIAGNOSIS — K5900 Constipation, unspecified: Secondary | ICD-10-CM

## 2021-11-01 MED ORDER — POLYETHYLENE GLYCOL 3350 17 GM/SCOOP PO POWD: ORAL | 3 refills | Status: DC

## 2021-11-01 NOTE — Patient Instructions (Addendum)
Start adding back bland foods like bananas, rice, toast. Hold off on dairy, raw vegetables for another week or so.  Do not taking anything to move your bowels right now. If you have not had a stool by Monday, start back on Miralax or generic. Take 1/2 capful daily as needed. Always take a dose of Miralax if you have not had a stool after 48 hours.  Call next week and let me know how you are doing.  Routine follow up in 12/2021 as planned.

## 2021-11-01 NOTE — Progress Notes (Signed)
GI Office Note    Referring Provider: Lucianne Lei, MD Primary Care Physician:  Lucianne Lei, MD  Primary Gastroenterologist: Elon Alas. Abbey Chatters, DO   Chief Complaint   Chief Complaint  Patient presents with   Diarrhea    Was taking fiber and Linzess, states that both together may be too much. Stopped taking the fiber and hasn't had a bm since Mon/Tues.     History of Present Illness   Nancy Liu is a 81 y.o. female presenting today for next day appointment for bowel concerns.  She was last seen on Oct 01, 2021 for constipation.  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  Having a hard time this year managing constipation. Seen in 07/2021 and was going several days without a BM and using finger to disimpact at times.  We started her on MiraLAX 17 g daily until regular bowel movement then continue daily as needed.  He was advised to use Senokot only occasionally if needed.  She was advised to take MiraLAX half capful every day, Benefiber 2 teaspoons daily, hold Senokot.  In [place] she complained of couple days of watery diarrhea after being on antibiotics.  Initially we advised her to hold MiraLAX and Senokot until diarrhea resolved and appointment was made.  She was seen May 2.  She reported after 3 days of MiraLAX she developed diarrhea.  This lasted for about a week.  Of note however she reported taking this along with Senokot.  She was advised to stop Senokot.  Add Benefiber 2 teaspoons daily along with half a capful of MiraLAX daily.  She was also given a to do list for constipation and diarrhea with regards to diet etc.  Patient presents with her daughter today.  At some point she was provided Linzess samples, she thought she received from [place].  We discussed our recommendations 1 month ago.  It is not clear that these were followed accordingly.  She reported to her PCP recently that regimen was not working and she  was provided Linzess 72 mcg daily.  At first it seemed to be working fine.  Then she developed to frequent or loose stools and was advised to cut back to every other day to every third day.  Patient apparently had significant diarrhea for several days, last episode on Tuesday.  During this timeframe she stopped all of her medications for constipation including Benefiber and Linzess.  She had already stopped MiraLAX.  Now she is worried that she has not had a bowel movement in 3 days.  She also has not consumed solid food in a week.  Started back on clear liquids a day or 2 ago.  Denies any melena or rectal bleeding.  No abdominal pain.  No vomiting.  No ill contacts.  Recently had an upper respiratory symptoms but denies being given further antibiotics.   Patient states she feels good when she has a bowel movement at least 3 to 4 days/week.   Last EGD and colonoscopy October 2017.  EGD revealed nonbleeding gastric ulcers, gastroduodenitis, nonbleeding AVM in second portion of duodenum, biopsy negative for H. pylori.  Colonoscopy with diverticulosis and nonbleeding hemorrhoids.  EGD repeated in January 2018 with erosive esophagitis with healed ulcers.  She subsequently reported melena and underwent another EGD in September 2021 with focal inflammation in the gastric antrum consistent with gastritis on biopsy, again negative for H. pylori. To wrap  up IDA eval, she complete capsule study completed May 2022 with few gastric and small bowel erosions and possible small bowel AVM without any active bleeding.   CT February 2021 with questionable fullness in the pancreatic head, 6 mm cyst in the pancreatic body. MRCP negative for mass or ductal dilation.    Medications   Current Outpatient Medications  Medication Sig Dispense Refill   acetaminophen (TYLENOL) 500 MG tablet Take 1,000 mg by mouth every 6 (six) hours as needed for moderate pain or headache.     amLODipine (NORVASC) 10 MG tablet TAKE ONE TABLET BY  MOUTH EVERYDAY AT BEDTIME 90 tablet 3   aspirin EC 81 MG tablet Take 81 mg by mouth daily. Swallow whole.     benzonatate (TESSALON) 200 MG capsule Take 1 capsule (200 mg total) by mouth 3 (three) times daily as needed for cough. 45 capsule 1   carvedilol (COREG) 6.25 MG tablet TAKE ONE TABLET BY MOUTH BEFORE BREAKFAST and TAKE ONE TABLET BY MOUTH EVERYDAY AT BEDTIME 180 tablet 3   doxepin (SINEQUAN) 10 MG capsule Take 10 mg by mouth at bedtime as needed.     irbesartan-hydrochlorothiazide (AVALIDE) 300-12.5 MG tablet Take 1 tablet by mouth daily before breakfast.     lansoprazole (PREVACID) 30 MG capsule Take 30 mg daily before breakfast and you can take additional dose '30mg'$  before evening meal if needed. 180 capsule 3   linaclotide (LINZESS) 72 MCG capsule Take 72 mcg by mouth daily before breakfast.     metFORMIN (GLUCOPHAGE) 1000 MG tablet Take 500 mg by mouth at bedtime.     NOREL AD 4-10-325 MG TABS Take 1 tablet by mouth 4 (four) times daily.     rosuvastatin (CRESTOR) 20 MG tablet TAKE ONE TABLET BY MOUTH EVERYDAY AT BEDTIME 90 tablet 3   triamcinolone ointment (KENALOG) 0.5 % Apply 1 application topically 2 (two) times daily as needed (rash).     Turmeric 500 MG CAPS Take 500 mg by mouth daily.     albuterol (VENTOLIN HFA) 108 (90 Base) MCG/ACT inhaler Inhale 2 puffs into the lungs every 6 (six) hours as needed for wheezing or shortness of breath. (Patient not taking: Reported on 10/01/2021) 8 g 2   No current facility-administered medications for this visit.    Allergies   Allergies as of 11/01/2021 - Review Complete 11/01/2021  Allergen Reaction Noted   Protonix [pantoprazole sodium]  03/27/2016       Review of Systems   General: Negative for anorexia, weight loss, fever, chills, fatigue, weakness. ENT: Negative for hoarseness, difficulty swallowing , nasal congestion. CV: Negative for chest pain, angina, palpitations, dyspnea on exertion, peripheral edema.  Respiratory:  Negative for dyspnea at rest, dyspnea on exertion, cough, sputum, wheezing.  GI: See history of present illness. GU:  Negative for dysuria, hematuria, urinary incontinence, urinary frequency, nocturnal urination.  Endo: Negative for unusual weight change.     Physical Exam   BP 124/62 (BP Location: Left Arm, Patient Position: Sitting, Cuff Size: Normal)   Pulse 88   Temp (!) 97.5 F (36.4 C) (Temporal)   Ht 5' 2.5" (1.588 m)   Wt 150 lb 6.4 oz (68.2 kg)   SpO2 100%   BMI 27.07 kg/m    General: Well-nourished, well-developed in no acute distress.  Eyes: No icterus. Mouth: Oropharyngeal mucosa moist and pink , no lesions erythema or exudate. Lungs: Clear to auscultation bilaterally.  Heart: Regular rate and rhythm, no murmurs rubs or gallops.  Abdomen:  Bowel sounds are normal, nontender, nondistended, no hepatosplenomegaly or masses,  no abdominal bruits or hernia , no rebound or guarding.  Rectal: not performed  Extremities: No lower extremity edema. No clubbing or deformities. Neuro: Alert and oriented x 4   Skin: Warm and dry, no jaundice.   Psych: Alert and cooperative, normal mood and affect.  Labs   Lab Results  Component Value Date   CREATININE 1.22 (H) 01/23/2021   BUN 21 01/23/2021   NA 142 01/23/2021   K 4.2 01/23/2021   CL 101 01/23/2021   CO2 29 01/23/2021   Lab Results  Component Value Date   WBC 10.5 09/13/2021   HGB 12.7 09/13/2021   HCT 41.9 09/13/2021   MCV 80.6 09/13/2021   PLT 354 09/13/2021   Lab Results  Component Value Date   ALT 17 01/23/2021   AST 24 01/23/2021   ALKPHOS 65 01/23/2021   BILITOT 0.6 01/23/2021   Lab Results  Component Value Date   IRON 61 09/13/2021   TIBC 307 09/13/2021   FERRITIN 115 09/13/2021   Lab Results  Component Value Date   YQMVHQIO96 295 12/26/2020   Lab Results  Component Value Date   FOLATE 13.0 06/22/2020    Imaging Studies   ECHOCARDIOGRAM COMPLETE  Result Date: 10/08/2021    ECHOCARDIOGRAM  REPORT   Patient Name:   SHAKELIA SCRIVNER Date of Exam: 10/08/2021 Medical Rec #:  [id number]         Height:       62.0 in Accession #:    [id number]        Weight:       148.6 lb Date of Birth:  1941/01/15        BSA:          1.685 m Patient Age:    31 years          BP:           120/68 mmHg Patient Gender: F                 HR:           78 bpm. Exam Location:  [facility] Procedure: 2D Echo, 3D Echo, Cardiac Doppler, Color Doppler and Strain Analysis Indications:    R01.1 Murmur  History:        Patient has prior history of Echocardiogram examinations, most                 recent 03/14/2016. CAD, Signs/Symptoms:Murmur; Risk                 Factors:Hypertension, Dyslipidemia and Former Smoker.  Sonographer:    Caesar Chestnut Referring Phys: [id number] [place]  1. Left ventricular ejection fraction, by estimation, is 60 to 65%. The left ventricle has normal function. The left ventricle has no regional wall motion abnormalities. Left ventricular diastolic parameters are consistent with Grade I diastolic dysfunction (impaired relaxation). The average left ventricular global longitudinal strain is -18.1 %. The global longitudinal strain is normal.  2. Right ventricular systolic function is normal. The right ventricular size is normal. There is normal pulmonary artery systolic pressure. The estimated right ventricular systolic pressure is 59.5 mmHg.  3. The mitral valve is grossly normal. Trivial mitral valve regurgitation.  4. The aortic valve was not well visualized. Aortic valve regurgitation is not visualized. Aortic valve sclerosis/calcification is present, without any evidence of aortic stenosis. Aortic valve mean gradient measures 4.0 mmHg.  5. The  inferior vena cava is normal in size with greater than 50% respiratory variability, suggesting right atrial pressure of 3 mmHg. Comparison(s): Prior images unable to be directly viewed. FINDINGS  Left Ventricle: Left ventricular ejection fraction, by  estimation, is 60 to 65%. The left ventricle has normal function. The left ventricle has no regional wall motion abnormalities. The average left ventricular global longitudinal strain is -18.1 %. The global longitudinal strain is normal. The left ventricular internal cavity size was normal in size. There is borderline left ventricular hypertrophy. Left ventricular diastolic parameters are consistent with Grade I diastolic dysfunction (impaired relaxation). Right Ventricle: The right ventricular size is normal. No increase in right ventricular wall thickness. Right ventricular systolic function is normal. There is normal pulmonary artery systolic pressure. The tricuspid regurgitant velocity is 1.91 m/s, and  with an assumed right atrial pressure of 3 mmHg, the estimated right ventricular systolic pressure is 16.0 mmHg. Left Atrium: Left atrial size was normal in size. Right Atrium: Right atrial size was normal in size. Pericardium: There is no evidence of pericardial effusion. Mitral Valve: The mitral valve is grossly normal. Mild mitral annular calcification. Trivial mitral valve regurgitation. Tricuspid Valve: The tricuspid valve is grossly normal. Tricuspid valve regurgitation is trivial. Aortic Valve: The aortic valve was not well visualized. There is mild aortic valve annular calcification. Aortic valve regurgitation is not visualized. Aortic valve sclerosis/calcification is present, without any evidence of aortic stenosis. Aortic valve  mean gradient measures 4.0 mmHg. Aortic valve peak gradient measures 7.8 mmHg. Aortic valve area, by VTI measures 2.37 cm. Pulmonic Valve: The pulmonic valve was grossly normal. Pulmonic valve regurgitation is trivial. Aorta: The aortic root is normal in size and structure. Venous: The inferior vena cava is normal in size with greater than 50% respiratory variability, suggesting right atrial pressure of 3 mmHg. IAS/Shunts: No atrial level shunt detected by color flow Doppler.   LEFT VENTRICLE PLAX 2D LVIDd:         2.75 cm     Diastology LVIDs:         1.82 cm     LV e' medial:    4.99 cm/s LV PW:         1.05 cm     LV E/e' medial:  16.1 LV IVS:        0.89 cm     LV e' lateral:   6.05 cm/s LVOT diam:     1.90 cm     LV E/e' lateral: 13.3 LV SV:         72 LV SV Index:   43          2D Longitudinal Strain LVOT Area:     2.84 cm    2D Strain GLS Avg:     -18.1 %  LV Volumes (MOD) LV vol d, MOD A2C: 58.9 ml LV vol d, MOD A4C: 51.7 ml LV vol s, MOD A2C: 17.6 ml LV vol s, MOD A4C: 18.7 ml LV SV MOD A2C:     41.3 ml LV SV MOD A4C:     51.7 ml LV SV MOD BP:      36.1 ml RIGHT VENTRICLE RV Basal diam:  3.36 cm RV S prime:     10.90 cm/s TAPSE (M-mode): 1.8 cm LEFT ATRIUM           Index        RIGHT ATRIUM          Index LA diam:  2.30 cm 1.37 cm/m   RA Area:     8.03 cm LA Vol (A2C): 16.5 ml 9.79 ml/m   RA Volume:   15.50 ml 9.20 ml/m LA Vol (A4C): 18.8 ml 11.16 ml/m  AORTIC VALVE                    PULMONIC VALVE AV Area (Vmax):    2.41 cm     PV Vmax:          0.82 m/s AV Area (Vmean):   2.26 cm     PV Peak grad:     2.7 mmHg AV Area (VTI):     2.37 cm     PR End Diast Vel: 4.67 msec AV Vmax:           140.00 cm/s AV Vmean:          90.400 cm/s AV VTI:            0.305 m AV Peak Grad:      7.8 mmHg AV Mean Grad:      4.0 mmHg LVOT Vmax:         119.00 cm/s LVOT Vmean:        71.900 cm/s LVOT VTI:          0.255 m LVOT/AV VTI ratio: 0.84  AORTA Ao Root diam: 2.70 cm Ao Asc diam:  3.00 cm MITRAL VALVE                TRICUSPID VALVE MV Area (PHT): 2.48 cm     TR Peak grad:   14.6 mmHg MV Decel Time: 306 msec     TR Vmax:        191.00 cm/s MV E velocity: 80.40 cm/s MV A velocity: 122.00 cm/s  SHUNTS MV E/A ratio:  0.66         Systemic VTI:  0.26 m                             Systemic Diam: 1.90 cm Rozann Lesches MD Electronically signed by Rozann Lesches MD Signature Date/Time: 10/08/2021/11:44:29 AM    Final     Assessment   Constipation: Continues to struggle with her bowel  habits.  Chronically is constipated.  We have made adjustments with bowel regimens over the past couple of months, it appears that she is sensitive and overshoots pretty quickly to diarrhea.  Discussed at length with patient and daughter, we need to keep trying to find a perfect regimen for her but this will often need to be adjusted periodically based on a number of factors.  Encourage patient to keep handout provided today so that she can stick with regimen that we are trying today.  In the past she has strayed from a recommendations inadvertently.  IDA: Continues to follow with hematology, receives periodic IV iron..  EGD, colonoscopy, capsule study as outlined above.  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  Needs to continue to follow with hematology as before.  GERD: Well-controlled.    PLAN   Patient reassured that it is completely normal not to have a bowel movement several days after having persisting diarrhea.  Would encourage her to hold off on medications for constipation for couple more days.  If no bowel movement by Monday, she will start back on MiraLAX only.  Taking half capful daily as needed.  If she goes more than 48 hours without a bowel  movement she should take MiraLAX. Patient to call next week to let us know how she is doing.  We can make further adjustments as needed in the weeks ahead with her bowel regimen.  She will keep [place] posted. She will keep appointment in August as planned. She will start back on solid foods such as bananas, toast, rice.  Hold off on raw vegetables and dairy products for another week or so.  Then she can slowly add back fiber and dairy if tolerated.   Laureen Ochs. Bobby Rumpf, [place], [facility]

## 2021-11-06 NOTE — Telephone Encounter (Signed)
Spoke to pt, she informed me that she has had 1 loose stool on Sunday and has had no bowel movement since then. She stated she has stop taking  MiraLax, Benefiber and Senokot. I informed her to start the MiraLax back and drink plenty of fluids. She voiced understanding. Also, informed her to call and let us know how she was doing in a week.

## 2021-11-12 ENCOUNTER — Telehealth: Payer: Self-pay

## 2021-11-12 NOTE — Telephone Encounter (Signed)
Pt called stating that she finally had a bm yesterday after not having one since 11/03/21. Pt states that it was real dark and was normal consistency. Pt states that it was a whole lot. Pt states that she has also had a bm this morning. Pt is currently taking one capful of miralax daily.

## 2021-11-19 DIAGNOSIS — H401131 Primary open-angle glaucoma, bilateral, mild stage: Secondary | ICD-10-CM | POA: Diagnosis not present

## 2021-11-19 DIAGNOSIS — H04123 Dry eye syndrome of bilateral lacrimal glands: Secondary | ICD-10-CM | POA: Diagnosis not present

## 2021-11-19 DIAGNOSIS — H2513 Age-related nuclear cataract, bilateral: Secondary | ICD-10-CM | POA: Diagnosis not present

## 2021-12-09 ENCOUNTER — Ambulatory Visit: Payer: PPO | Admitting: Physician Assistant

## 2021-12-12 DIAGNOSIS — I10 Essential (primary) hypertension: Secondary | ICD-10-CM | POA: Diagnosis not present

## 2021-12-12 DIAGNOSIS — Z6828 Body mass index (BMI) 28.0-28.9, adult: Secondary | ICD-10-CM | POA: Diagnosis not present

## 2021-12-12 DIAGNOSIS — I119 Hypertensive heart disease without heart failure: Secondary | ICD-10-CM | POA: Diagnosis not present

## 2021-12-12 DIAGNOSIS — W010XXA Fall on same level from slipping, tripping and stumbling without subsequent striking against object, initial encounter: Secondary | ICD-10-CM | POA: Diagnosis not present

## 2021-12-12 DIAGNOSIS — Z0000 Encounter for general adult medical examination without abnormal findings: Secondary | ICD-10-CM | POA: Diagnosis not present

## 2021-12-12 DIAGNOSIS — E1169 Type 2 diabetes mellitus with other specified complication: Secondary | ICD-10-CM | POA: Diagnosis not present

## 2021-12-19 ENCOUNTER — Telehealth: Payer: Self-pay | Admitting: Pulmonary Disease

## 2021-12-19 MED ORDER — BENZONATATE 200 MG PO CAPS: 200.0000 mg | ORAL_CAPSULE | Freq: Three times a day (TID) | ORAL | 1 refills | Status: DC | PRN

## 2021-12-19 NOTE — Telephone Encounter (Signed)
Called and spoke to patient. Confirmed that she was referring to [place]. I let her know we could refill since Dr. Collie Siad last ov note states prn tessalon perles. Refilled and sent to [facility] [place]. Nothing further is needed.

## 2022-01-07 ENCOUNTER — Ambulatory Visit: Payer: PPO | Admitting: Gastroenterology

## 2022-01-11 DIAGNOSIS — D649 Anemia, unspecified: Secondary | ICD-10-CM | POA: Diagnosis not present

## 2022-01-11 DIAGNOSIS — E663 Overweight: Secondary | ICD-10-CM | POA: Diagnosis not present

## 2022-01-11 DIAGNOSIS — I25119 Atherosclerotic heart disease of native coronary artery with unspecified angina pectoris: Secondary | ICD-10-CM | POA: Diagnosis not present

## 2022-01-11 DIAGNOSIS — F320 Major depressive disorder, single episode, mild: Secondary | ICD-10-CM | POA: Diagnosis not present

## 2022-01-11 DIAGNOSIS — E785 Hyperlipidemia, unspecified: Secondary | ICD-10-CM | POA: Diagnosis not present

## 2022-01-11 DIAGNOSIS — E1169 Type 2 diabetes mellitus with other specified complication: Secondary | ICD-10-CM | POA: Diagnosis not present

## 2022-01-11 DIAGNOSIS — E1165 Type 2 diabetes mellitus with hyperglycemia: Secondary | ICD-10-CM | POA: Diagnosis not present

## 2022-01-11 DIAGNOSIS — E1139 Type 2 diabetes mellitus with other diabetic ophthalmic complication: Secondary | ICD-10-CM | POA: Diagnosis not present

## 2022-01-11 DIAGNOSIS — E1136 Type 2 diabetes mellitus with diabetic cataract: Secondary | ICD-10-CM | POA: Diagnosis not present

## 2022-01-11 DIAGNOSIS — E559 Vitamin D deficiency, unspecified: Secondary | ICD-10-CM | POA: Diagnosis not present

## 2022-01-11 DIAGNOSIS — J449 Chronic obstructive pulmonary disease, unspecified: Secondary | ICD-10-CM | POA: Diagnosis not present

## 2022-01-11 DIAGNOSIS — I25118 Atherosclerotic heart disease of native coronary artery with other forms of angina pectoris: Secondary | ICD-10-CM | POA: Diagnosis not present

## 2022-01-13 ENCOUNTER — Other Ambulatory Visit: Payer: Self-pay

## 2022-01-13 DIAGNOSIS — D0511 Intraductal carcinoma in situ of right breast: Secondary | ICD-10-CM

## 2022-01-13 DIAGNOSIS — E559 Vitamin D deficiency, unspecified: Secondary | ICD-10-CM

## 2022-01-13 DIAGNOSIS — D500 Iron deficiency anemia secondary to blood loss (chronic): Secondary | ICD-10-CM

## 2022-01-14 NOTE — Progress Notes (Unsigned)
[place]   [phone] [phone]   Clinic Follow up Note   Patient Care Team: Lucianne Lei, MD as PCP - General (Family Medicine) Harl Bowie, Alphonse Guild, MD as PCP - Cardiology (Cardiology) Truitt Merle, MD as Consulting Physician (Hematology) Rolm Bookbinder, MD as Consulting Physician (General Surgery) Kyung Rudd, MD as Consulting Physician (Radiation Oncology) Chesley Mires, MD as Consulting Physician (Pulmonary Disease) Eloise Harman, DO as Consulting Physician (Gastroenterology) 01/15/2022  CHIEF COMPLAINT: Follow up right breast DCIS and ALH  SUMMARY OF ONCOLOGIC HISTORY: Oncology History  Ductal carcinoma in situ (DCIS) of right breast  01/01/2021 Mammogram   FINDINGS: Multiple circumscribed oval masses are noted in the MEDIAL portion of the RIGHT breast. Masses vary from 1-4 millimeters in diameter and appear contiguous, spanning 8.9 x 3.6 x 5.1 centimeters. There are faint calcifications spanning 4.2 x 1.9 x 3.5 centimeters in this same, segmental distribution.   01/14/2021 Cancer Staging   Staging form: Breast, AJCC 8th Edition - Clinical stage from 01/14/2021: Stage 0 (cTis (DCIS), cN0, cM0, G2, ER+, PR+, HER2: Not Assessed) - Signed by Truitt Merle, MD on 01/21/2021 Stage prefix: Initial diagnosis Histologic grading system: 3 grade system   01/14/2021 Pathology Results   Diagnosis 1. Breast, right, needle core biopsy, anterior extent - DUCTAL CARCINOMA IN SITU, INTERMEDIATE GRADE WITH CALCIFICATIONS. SEE NOTE 2. Breast, right, needle core biopsy, posterior extent - DUCTAL CARCINOMA IN SITU, INTERMEDIATE GRADE WITH CALCIFICATIONS. SEE NOTE Diagnosis Note 1. and 2. DCIS measures 0.6 cm in part 1 and 0.4 cm in part 2.  1. PROGNOSTIC INDICATORS Results: IMMUNOHISTOCHEMICAL AND MORPHOMETRIC ANALYSIS PERFORMED MANUALLY Estrogen Receptor: >95%, POSITIVE, STRONG STAINING INTENSITY Progesterone Receptor: >95%, POSITIVE, STRONG STAINING  INTENSITY     01/17/2021 Initial Diagnosis   Ductal carcinoma in situ (DCIS) of right breast   02/05/2021 Genetic Testing   Negative hereditary cancer genetic testing: no pathogenic variants detected in [facility] CustomNext-Cancer +RNAinsight Panel.  The report date is February 05, 2021.   The CustomNext-Cancer+RNAinsight panel offered by [facility] includes sequencing and rearrangement analysis for the following 47 genes:  APC, ATM, AXIN2, BARD1, BMPR1A, BRCA1, BRCA2, BRIP1, CDH1, CDK4, CDKN2A, CHEK2, DICER1, EPCAM, GREM1, HOXB13, MEN1, MLH1, MSH2, MSH3, MSH6, MUTYH, NBN, NF1, NF2, NTHL1, PALB2, PMS2, POLD1, POLE, PTEN, RAD51C, RAD51D, RECQL, RET, SDHA, SDHAF2, SDHB, SDHC, SDHD, SMAD4, SMARCA4, STK11, TP53, TSC1, TSC2, and VHL.  RNA data is routinely analyzed for use in variant interpretation for all genes.   02/20/2021 Cancer Staging   Staging form: Breast, AJCC 8th Edition - Pathologic stage from 02/20/2021: No Stage Recommended (pTis (DCIS), pNX, cM1, G2, ER+, PR+, HER2: Not Assessed) - Signed by Truitt Merle, MD on 03/12/2021 Stage prefix: Initial diagnosis Histologic grading system: 3 grade system Residual tumor (R): R0 - None   02/20/2021 Definitive Surgery   FINAL MICROSCOPIC DIAGNOSIS:   A. BREAST, RIGHT, MASTECTOMY:  - Multifocal intermediate grade ductal carcinoma in situ with calcifications.  - Atypical lobular hyperplasia.  - Margins are negative for carcinoma.  - Biopsy site (x2).  - See oncology table.      CURRENT THERAPY: Surveillance/high risk screening   INTERVAL HISTORY: Ms. Brummell returns for follow up as scheduled. Last seen in person by Dr. Burr Medico 03/13/21 and virtual survivorship 06/04/21. Mammogram 09/12/21 was negative.  She is followed at [facility] for anemia, it is closer to home.  She reports low energy, little motivation to do things, and depression she attributes to the pandemic and  breast cancer.  She has strong family support in a social network  including friends and church.  She has discussed depression with her PCP.  Denies SI.  Uses Kenalog for skin rash on her lower legs.  Denies any other new specific complaints.  All other systems were reviewed with the patient and are negative.  MEDICAL HISTORY:  Past Medical History:  Diagnosis Date   Arteriosclerotic cardiovascular disease (ASCVD) 2001   Bare-metal stent placed in the right coronary artery in 12/01; residual 50% lesion of the first diagonal and mid LAD   Breast cancer (HCC)    Cerebrovascular disease    COPD (chronic obstructive pulmonary disease) (HCC)    Cyst, dermoid, arm, left 03/26/2018   Diabetes mellitus    excellent control with a low-dose of a single oral agent   Family history of breast cancer 01/23/2021   Family history of ovarian cancer 01/23/2021   GERD (gastroesophageal reflux disease)    with ulcers   History of right coronary artery stent placement 2000   Hyperlipidemia    Hypertension    Sleep apnea    Thalassemia minor    Tobacco abuse, in remission    35 pack years; Quit in 1980    SURGICAL HISTORY: Past Surgical History:  Procedure Laterality Date   BIOPSY  02/28/2020   Procedure: BIOPSY;  Surgeon: Eloise Harman, DO;  [facility];  Service: Endoscopy;;   COLONOSCOPY  2006   Dr. Collene Mares: normal   COLONOSCOPY  2000   Dr. Everlean Cherry: normal    COLONOSCOPY N/A 03/03/2016   Dr. Oneida Alar: diverticulosis, non-bleeding hemorrhoids, redundant left colon.   DILATION AND CURETTAGE OF UTERUS     ESOPHAGOGASTRODUODENOSCOPY  2000   Dr. Everlean Cherry: gastritis    ESOPHAGOGASTRODUODENOSCOPY  2006   Dr. Collene Mares: small hiatal hernia, gastritis, negative H.pylori    ESOPHAGOGASTRODUODENOSCOPY N/A 03/03/2016   Procedure: ESOPHAGOGASTRODUODENOSCOPY (EGD);  Surgeon: Danie Binder, MD;  [facility];  Service: Endoscopy;  Laterality: N/A;   ESOPHAGOGASTRODUODENOSCOPY N/A 06/03/2016   Dr. Oneida Alar: nonaggressive gastritis due to aspirin use. Previous  ulcers had healed.   ESOPHAGOGASTRODUODENOSCOPY (EGD) WITH PROPOFOL N/A 02/28/2020   focal inflammation in the gastric antrum consistent with gastritis on biopsies. No H.pylori.    EXCISION OF KELOID Left 03/26/2018   Procedure: EXCISION OF LEFT ARM MASS;  Surgeon: Fanny Skates, MD;  Location: [place];  Service: General;  Laterality: Left;   GIVENS CAPSULE STUDY N/A 10/10/2020   Procedure: GIVENS CAPSULE STUDY;  Surgeon: Eloise Harman, DO;  [facility];  Service: Endoscopy;  Laterality: N/A;  7:30am   INGUINAL HERNIA REPAIR  1970s   Right   TOTAL MASTECTOMY Right 02/20/2021   Procedure: RIGHT TOTAL MASTECTOMY;  Surgeon: Rolm Bookbinder, MD;  Location: [place];  Service: General;  Laterality: Right;   VAGINAL HYSTERECTOMY  1980   Unilateral oophorectomy    I have reviewed the social history and family history with the patient and they are unchanged from previous note.  ALLERGIES:  is allergic to protonix [pantoprazole sodium].  MEDICATIONS:  Current Outpatient Medications  Medication Sig Dispense Refill   Cholecalciferol (VITAMIN D3) 75 MCG (3000 UT) TABS Take 1 tablet by mouth daily. 30 tablet 1   acetaminophen (TYLENOL) 500 MG tablet Take 1,000 mg by mouth every 6 (six) hours as needed for moderate pain or headache.     albuterol (VENTOLIN HFA) 108 (90 Base) MCG/ACT inhaler Inhale 2 puffs into the  lungs every 6 (six) hours as needed for wheezing or shortness of breath. (Patient not taking: Reported on 10/01/2021) 8 g 2   amLODipine (NORVASC) 10 MG tablet TAKE ONE TABLET BY MOUTH EVERYDAY AT BEDTIME 90 tablet 3   aspirin EC 81 MG tablet Take 81 mg by mouth daily. Swallow whole.     benzonatate (TESSALON) 200 MG capsule Take 1 capsule (200 mg total) by mouth 3 (three) times daily as needed for cough. 45 capsule 1   carvedilol (COREG) 6.25 MG tablet TAKE ONE TABLET BY MOUTH BEFORE BREAKFAST and TAKE ONE TABLET BY MOUTH EVERYDAY AT BEDTIME 180 tablet 3    doxepin (SINEQUAN) 10 MG capsule Take 10 mg by mouth at bedtime as needed.     irbesartan-hydrochlorothiazide (AVALIDE) 300-12.5 MG tablet Take 1 tablet by mouth daily before breakfast.     lansoprazole (PREVACID) 30 MG capsule Take 30 mg daily before breakfast and you can take additional dose 44m before evening meal if needed. 180 capsule 3   metFORMIN (GLUCOPHAGE) 1000 MG tablet Take 500 mg by mouth at bedtime.     NOREL AD 4-10-325 MG TABS Take 1 tablet by mouth 4 (four) times daily.     polyethylene glycol powder (GLYCOLAX/MIRALAX) 17 GM/SCOOP powder Take 1/2 capful daily as needed. Make sure to take a dose if you go more than 48 hours without a bowel movement. 255 g 3   rosuvastatin (CRESTOR) 20 MG tablet TAKE ONE TABLET BY MOUTH EVERYDAY AT BEDTIME 90 tablet 3   triamcinolone ointment (KENALOG) 0.5 % Apply 1 application topically 2 (two) times daily as needed (rash).     Turmeric 500 MG CAPS Take 500 mg by mouth daily.     No current facility-administered medications for this visit.    PHYSICAL EXAMINATION: ECOG PERFORMANCE STATUS: 0 - Asymptomatic  Vitals:   01/15/22 0953  BP: (!) 151/86  Pulse: 74  Resp: 16  Temp: 98.2 F (36.8 C)  SpO2: 100%   Filed Weights   01/15/22 0953  Weight: 150 lb 3.2 oz (68.1 kg)    GENERAL:alert, no distress and comfortable SKIN: Scattered areas of hyper pigmentation of the lower extremities EYES: sclera clear NECK: Without mass LYMPH:  no palpable cervical or supraclavicular lymphadenopathy LUNGS:  normal breathing effort HEART: no lower extremity edema ABDOMEN:abdomen soft, non-tender and normal bowel sounds NEURO: alert & oriented x 3 with fluent speech, no focal motor deficits Breast exam: S/p right mastectomy, incisions completely healed with minimal scar tissue, tight skin.  Left nipple retraction at baseline, no discharge.  No palpable mass or nodularity along the right chest wall, left breast, or either axilla that I could  appreciate.  LABORATORY DATA:  I have reviewed the data as listed    Latest Ref Rng & Units 09/13/2021    8:40 AM 05/13/2021   10:17 AM 01/23/2021    7:45 AM  CBC  WBC 4.0 - 10.5 K/uL 10.5  7.2  10.9   Hemoglobin 12.0 - 15.0 g/dL 12.7  12.5  12.9   Hematocrit 36.0 - 46.0 % 41.9  39.9  41.1   Platelets 150 - 400 K/uL 354  262  264         Latest Ref Rng & Units 01/23/2021    7:45 AM 12/26/2020    8:46 AM 10/03/2020    7:35 PM  CMP  Glucose 70 - 99 mg/dL 112  109  94   BUN 8 - 23 mg/dL 21  23  23   Creatinine 0.44 - 1.00 mg/dL 1.22  1.16  1.07   Sodium 135 - 145 mmol/L 142  138  136   Potassium 3.5 - 5.1 mmol/L 4.2  4.2  3.5   Chloride 98 - 111 mmol/L 101  101  100   CO2 22 - 32 mmol/L 29  29  25    Calcium 8.9 - 10.3 mg/dL 10.0  9.2  9.2   Total Protein 6.5 - 8.1 g/dL 7.4  7.0    Total Bilirubin 0.3 - 1.2 mg/dL 0.6  0.7    Alkaline Phos 38 - 126 U/L 65  58    AST 15 - 41 U/L 24  21    ALT 0 - 44 U/L 17  15        RADIOGRAPHIC STUDIES: I have personally reviewed the radiological images as listed and agreed with the findings in the report. No results found.   ASSESSMENT & PLAN:  81 yo female    Multifocal right breast DCIS, grade 2, ER and PR positive, and ALH -S/p right mastectomy 02/20/21 by Dr. Donne Hazel. Path showed multifocal intermediate grade DCIS, surgical margins were negative.  It also showed atypical lobular hyperplasia [facility]), which is a high risk for future breast cancer. -  Due to her advanced age and potential side effects, she did not proceed with anti-estrogen therapy  -Ms. Seckman is clinically doing well.  Breast exam is benign, left mammo 08/2021 is negative.  No clinical concern for recurrent or new breast cancer. -Continue surveillance and high risk screening which includes annual left mammo and breast MRI staggered 6 months apart.  I have ordered her MRI for October -She currently follows with [place] for breast cancer surveillance and at [facility] for anemia,  would like to consolidate her care and be followed closer to home.  I have sent a message to Casey Burkitt, PA to take over breast follow-up   2. Bone health  -DEXA scan 09/12/21 showed mild osteopenia, lowest T score -1.2 at the R femur neck -I recommend to continue calcium and weightbearing exercise and restart vitamin D supplement.  She was previously on high dose, last level 37 (normal) in 08/2021.  I prescribed 3000 IUs daily  3. Depression  -She reports fatigue, anhedonia, and depression.  Denies SI -Has family and social support and a strong church community. -She denies Marketing executive today, she will follow-up with PCP who is already aware of this  4. Anemia -Followed by hematology in [place] center, on oral iron with IV iron PRN -labs and f/up next week  PLAN: -L mammo reviewed, negative -Continue breast cancer surveillance and high risk screening program -breast MRI 03/2022, next L mammo 09/2022 -will ask Tarri Abernethy, PA at [facility] to take over breast f/up      Orders Placed This Encounter  Procedures   MR BREAST BILATERAL [facility] [place] CAD    Standing Status:   Future    Standing Expiration Date:   01/16/2023    Order Specific Question:   If indicated for the ordered procedure, I authorize the administration of contrast media per Radiology protocol    Answer:   Yes    Order Specific Question:   What is the patient's sedation requirement?    Answer:   No Sedation    Order Specific Question:   Does the patient have a pacemaker or implanted devices?    Answer:   No    Order  Specific Question:   Preferred imaging location?    Answer:   [facility] (table limit - 550 lbs)   All questions were answered. The patient knows to call the clinic with any problems, questions or concerns. No barriers to learning was detected. I spent 20 minutes counseling the patient face to face. The total time spent in the appointment was 30 minutes and more  than 50% was on counseling and review of test results and coordination of care.      Alla Feeling, NP 01/15/22

## 2022-01-15 ENCOUNTER — Other Ambulatory Visit: Payer: Self-pay

## 2022-01-15 ENCOUNTER — Inpatient Hospital Stay: Payer: PPO | Attending: Nurse Practitioner | Admitting: Nurse Practitioner

## 2022-01-15 ENCOUNTER — Encounter: Payer: Self-pay | Admitting: Nurse Practitioner

## 2022-01-15 VITALS — BP 151/86 | HR 74 | Temp 98.2°F | Resp 16 | Ht 62.5 in | Wt 150.2 lb

## 2022-01-15 DIAGNOSIS — D0511 Intraductal carcinoma in situ of right breast: Secondary | ICD-10-CM | POA: Insufficient documentation

## 2022-01-15 DIAGNOSIS — Z7982 Long term (current) use of aspirin: Secondary | ICD-10-CM | POA: Diagnosis not present

## 2022-01-15 DIAGNOSIS — Z7984 Long term (current) use of oral hypoglycemic drugs: Secondary | ICD-10-CM | POA: Diagnosis not present

## 2022-01-15 DIAGNOSIS — Z79899 Other long term (current) drug therapy: Secondary | ICD-10-CM | POA: Diagnosis not present

## 2022-01-15 DIAGNOSIS — Z9011 Acquired absence of right breast and nipple: Secondary | ICD-10-CM | POA: Diagnosis not present

## 2022-01-15 DIAGNOSIS — M85851 Other specified disorders of bone density and structure, right thigh: Secondary | ICD-10-CM | POA: Diagnosis not present

## 2022-01-15 DIAGNOSIS — F32A Depression, unspecified: Secondary | ICD-10-CM | POA: Insufficient documentation

## 2022-01-15 DIAGNOSIS — N6091 Unspecified benign mammary dysplasia of right breast: Secondary | ICD-10-CM | POA: Diagnosis not present

## 2022-01-15 MED ORDER — VITAMIN D3 75 MCG (3000 UT) PO TABS: 1.0000 | ORAL_TABLET | Freq: Every day | ORAL | 1 refills | Status: DC

## 2022-01-17 ENCOUNTER — Inpatient Hospital Stay: Payer: PPO

## 2022-01-17 DIAGNOSIS — N6091 Unspecified benign mammary dysplasia of right breast: Secondary | ICD-10-CM

## 2022-01-17 DIAGNOSIS — E559 Vitamin D deficiency, unspecified: Secondary | ICD-10-CM

## 2022-01-17 DIAGNOSIS — D0511 Intraductal carcinoma in situ of right breast: Secondary | ICD-10-CM

## 2022-01-17 DIAGNOSIS — D500 Iron deficiency anemia secondary to blood loss (chronic): Secondary | ICD-10-CM

## 2022-01-17 LAB — CBC WITH DIFFERENTIAL/PLATELET
Abs Immature Granulocytes: 0.02 10*3/uL (ref 0.00–0.07)
Basophils Absolute: 0 10*3/uL (ref 0.0–0.1)
Basophils Relative: 1 %
Eosinophils Absolute: 0.2 10*3/uL (ref 0.0–0.5)
Eosinophils Relative: 3 %
HCT: 38.8 % (ref 36.0–46.0)
Hemoglobin: 12.3 g/dL (ref 12.0–15.0)
Immature Granulocytes: 0 %
Lymphocytes Relative: 14 %
Lymphs Abs: 1 10*3/uL (ref 0.7–4.0)
MCH: 25.4 pg — ABNORMAL LOW (ref 26.0–34.0)
MCHC: 31.7 g/dL (ref 30.0–36.0)
MCV: 80 fL (ref 80.0–100.0)
Monocytes Absolute: 0.4 10*3/uL (ref 0.1–1.0)
Monocytes Relative: 6 %
Neutro Abs: 5.5 10*3/uL (ref 1.7–7.7)
Neutrophils Relative %: 76 %
Platelets: 250 10*3/uL (ref 150–400)
RBC: 4.85 MIL/uL (ref 3.87–5.11)
RDW: 15.2 % (ref 11.5–15.5)
WBC: 7.3 10*3/uL (ref 4.0–10.5)
nRBC: 0 % (ref 0.0–0.2)

## 2022-01-17 LAB — IRON AND TIBC
Iron: 50 ug/dL (ref 28–170)
Saturation Ratios: 16 % (ref 10.4–31.8)
TIBC: 318 ug/dL (ref 250–450)
UIBC: 268 ug/dL

## 2022-01-17 LAB — VITAMIN D 25 HYDROXY (VIT D DEFICIENCY, FRACTURES): Vit D, 25-Hydroxy: 45.59 ng/mL (ref 30–100)

## 2022-01-17 LAB — FERRITIN: Ferritin: 125 ng/mL (ref 11–307)

## 2022-01-21 NOTE — Progress Notes (Unsigned)
GI Office Note    Referring Provider: Lucianne Lei, MD Primary Care Physician:  Lucianne Lei, MD  Primary Gastroenterologist: Elon Alas. Abbey Chatters, DO   Chief Complaint   No chief complaint on file.   History of Present Illness   Nancy Liu is a 81 y.o. female presenting today for follow up of bowel concerns. Last seen in 10/2021. She has history of IDA likely due to small bowel AVMS, constipation, and GERD.      Last EGD and colonoscopy October 2017.  EGD revealed nonbleeding gastric ulcers, gastroduodenitis, nonbleeding AVM in second portion of duodenum, biopsy negative for H. pylori.  Colonoscopy with diverticulosis and nonbleeding hemorrhoids.  EGD repeated in January 2018 with erosive esophagitis with healed ulcers.  She subsequently reported melena and underwent another EGD in September 2021 with focal inflammation in the gastric antrum consistent with gastritis on biopsy, again negative for H. pylori. To wrap up IDA eval, she complete capsule study completed May 2022 with few gastric and small bowel erosions and possible small bowel AVM without any active bleeding.   CT February 2021 with questionable fullness in the pancreatic head, 6 mm cyst in the pancreatic body. MRCP negative for mass or ductal dilation.     Medications   Current Outpatient Medications  Medication Sig Dispense Refill   acetaminophen (TYLENOL) 500 MG tablet Take 1,000 mg by mouth every 6 (six) hours as needed for moderate pain or headache.     albuterol (VENTOLIN HFA) 108 (90 Base) MCG/ACT inhaler Inhale 2 puffs into the lungs every 6 (six) hours as needed for wheezing or shortness of breath. (Patient not taking: Reported on 10/01/2021) 8 g 2   amLODipine (NORVASC) 10 MG tablet TAKE ONE TABLET BY MOUTH EVERYDAY AT BEDTIME 90 tablet 3   aspirin EC 81 MG tablet Take 81 mg by mouth daily. Swallow whole.     benzonatate (TESSALON) 200 MG capsule Take 1 capsule (200 mg total) by mouth 3 (three) times  daily as needed for cough. 45 capsule 1   carvedilol (COREG) 6.25 MG tablet TAKE ONE TABLET BY MOUTH BEFORE BREAKFAST and TAKE ONE TABLET BY MOUTH EVERYDAY AT BEDTIME 180 tablet 3   Cholecalciferol (VITAMIN D3) 75 MCG (3000 UT) TABS Take 1 tablet by mouth daily. 30 tablet 1   doxepin (SINEQUAN) 10 MG capsule Take 10 mg by mouth at bedtime as needed.     irbesartan-hydrochlorothiazide (AVALIDE) 300-12.5 MG tablet Take 1 tablet by mouth daily before breakfast.     lansoprazole (PREVACID) 30 MG capsule Take 30 mg daily before breakfast and you can take additional dose 43m before evening meal if needed. 180 capsule 3   metFORMIN (GLUCOPHAGE) 1000 MG tablet Take 500 mg by mouth at bedtime.     NOREL AD 4-10-325 MG TABS Take 1 tablet by mouth 4 (four) times daily.     polyethylene glycol powder (GLYCOLAX/MIRALAX) 17 GM/SCOOP powder Take 1/2 capful daily as needed. Make sure to take a dose if you go more than 48 hours without a bowel movement. 255 g 3   rosuvastatin (CRESTOR) 20 MG tablet TAKE ONE TABLET BY MOUTH EVERYDAY AT BEDTIME 90 tablet 3   triamcinolone ointment (KENALOG) 0.5 % Apply 1 application topically 2 (two) times daily as needed (rash).     Turmeric 500 MG CAPS Take 500 mg by mouth daily.     No current facility-administered medications for this visit.    Allergies   Allergies as of 01/22/2022 -  Review Complete 01/15/2022  Allergen Reaction Noted   Protonix [pantoprazole sodium]  03/27/2016     Past Medical History   Past Medical History:  Diagnosis Date   Arteriosclerotic cardiovascular disease (ASCVD) 2001   Bare-metal stent placed in the right coronary artery in 12/01; residual 50% lesion of the first diagonal and mid LAD   Breast cancer (HCC)    Cerebrovascular disease    COPD (chronic obstructive pulmonary disease) (HCC)    Cyst, dermoid, arm, left 03/26/2018   Diabetes mellitus    excellent control with a low-dose of a single oral agent   Family history of breast  cancer 01/23/2021   Family history of ovarian cancer 01/23/2021   GERD (gastroesophageal reflux disease)    with ulcers   History of right coronary artery stent placement 2000   Hyperlipidemia    Hypertension    Sleep apnea    Thalassemia minor    Tobacco abuse, in remission    35 pack years; Quit in 1980    Past Surgical History   Past Surgical History:  Procedure Laterality Date   BIOPSY  02/28/2020   Procedure: BIOPSY;  Surgeon: Eloise Harman, DO;  [facility];  Service: Endoscopy;;   COLONOSCOPY  2006   Dr. Collene Mares: normal   COLONOSCOPY  2000   Dr. Everlean Cherry: normal    COLONOSCOPY N/A 03/03/2016   Dr. Oneida Alar: diverticulosis, non-bleeding hemorrhoids, redundant left colon.   DILATION AND CURETTAGE OF UTERUS     ESOPHAGOGASTRODUODENOSCOPY  2000   Dr. Everlean Cherry: gastritis    ESOPHAGOGASTRODUODENOSCOPY  2006   Dr. Collene Mares: small hiatal hernia, gastritis, negative H.pylori    ESOPHAGOGASTRODUODENOSCOPY N/A 03/03/2016   Procedure: ESOPHAGOGASTRODUODENOSCOPY (EGD);  Surgeon: Danie Binder, MD;  [facility];  Service: Endoscopy;  Laterality: N/A;   ESOPHAGOGASTRODUODENOSCOPY N/A 06/03/2016   Dr. Oneida Alar: nonaggressive gastritis due to aspirin use. Previous ulcers had healed.   ESOPHAGOGASTRODUODENOSCOPY (EGD) WITH PROPOFOL N/A 02/28/2020   focal inflammation in the gastric antrum consistent with gastritis on biopsies. No H.pylori.    EXCISION OF KELOID Left 03/26/2018   Procedure: EXCISION OF LEFT ARM MASS;  Surgeon: Fanny Skates, MD;  Location: [place];  Service: General;  Laterality: Left;   GIVENS CAPSULE STUDY N/A 10/10/2020   Procedure: GIVENS CAPSULE STUDY;  Surgeon: Eloise Harman, DO;  [facility];  Service: Endoscopy;  Laterality: N/A;  7:30am   INGUINAL HERNIA REPAIR  1970s   Right   TOTAL MASTECTOMY Right 02/20/2021   Procedure: RIGHT TOTAL MASTECTOMY;  Surgeon: Rolm Bookbinder, MD;  Location: [place];  Service: General;   Laterality: Right;   VAGINAL HYSTERECTOMY  1980   Unilateral oophorectomy    Past Family History   Family History  Problem Relation Age of Onset   Heart disease Mother        also hypertension and asthma   Lung cancer Mother        dx 41s   Ovarian cancer Mother        dx before 57   Diabetes Half-Sister    Breast cancer Half-Sister 8   Multiple myeloma Nephew 53       war-related exposures   Colon cancer Neg Hx    Colon polyps Neg Hx     Past Social History   Social History   Socioeconomic History   Marital status: Divorced    Spouse name: Not on file   Number of children: 3  Years of education: Not on file   Highest education level: Not on file  Occupational History   Occupation: Retired    Comment: [employer] work  Tobacco Use   Smoking status: Former    Packs/day: 1.00    Years: 18.00    Total pack years: 18.00    Types: Cigarettes    Start date: 06/02/1965    Quit date: 06/09/1980    Years since quitting: 41.6   Smokeless tobacco: Never  Vaping Use   Vaping Use: Never used  Substance and Sexual Activity   Alcohol use: No    Alcohol/week: 0.0 standard drinks of alcohol   Drug use: No   Sexual activity: Never  Other Topics Concern   Not on file  Social History Narrative   Lives with daughter    Social Determinants of Health   Financial Resource Strain: [place]  (06/04/2021)   Overall Financial Resource Strain (CARDIA)    Difficulty of Paying Living Expenses: Not hard at all  Food Insecurity: No Food Insecurity (01/23/2021)   Hunger Vital Sign    Worried About Running Out of Food in the Last Year: Never true    [place] in the Last Year: Never true  Transportation Needs: No Transportation Needs (01/23/2021)   PRAPARE - Hydrologist (Medical): No    Lack of Transportation (Non-Medical): No  Physical Activity: Inactive (06/04/2021)   Exercise Vital Sign    Days of Exercise per Week: 0 days    Minutes of Exercise per  Session: 0 min  Stress: Stress Concern Present (06/04/2021)   [place]    Feeling of Stress : To some extent  Social Connections: Moderately Isolated (06/04/2021)   Social Connection and Isolation Panel [NHANES]    Frequency of Communication with Friends and Family: More than three times a week    Frequency of Social Gatherings with Friends and Family: More than three times a week    Attends Religious Services: More than 4 times per year    Active Member of [employer] or Organizations: No    Attends Archivist Meetings: Never    Marital Status: Divorced  Human resources officer Violence: Not At Risk (06/04/2021)   Humiliation, Afraid, Rape, and Kick questionnaire    Fear of Current or Ex-Partner: No    Emotionally Abused: No    Physically Abused: No    Sexually Abused: No    Review of Systems   General: Negative for anorexia, weight loss, fever, chills, fatigue, weakness. ENT: Negative for hoarseness, difficulty swallowing , nasal congestion. CV: Negative for chest pain, angina, palpitations, dyspnea on exertion, peripheral edema.  Respiratory: Negative for dyspnea at rest, dyspnea on exertion, cough, sputum, wheezing.  GI: See history of present illness. GU:  Negative for dysuria, hematuria, urinary incontinence, urinary frequency, nocturnal urination.  Endo: Negative for unusual weight change.     Physical Exam   There were no vitals taken for this visit.   General: Well-nourished, well-developed in no acute distress.  Eyes: No icterus. Mouth: Oropharyngeal mucosa moist and pink , no lesions erythema or exudate. Lungs: Clear to auscultation bilaterally.  Heart: Regular rate and rhythm, no murmurs rubs or gallops.  Abdomen: Bowel sounds are normal, nontender, nondistended, no hepatosplenomegaly or masses,  no abdominal bruits or hernia , no rebound or guarding.  Rectal: ***  Extremities: No lower extremity edema.  No clubbing or deformities. Neuro: Alert and  oriented x 4   Skin: Warm and dry, no jaundice.   Psych: Alert and cooperative, normal mood and affect.  Labs   *** Imaging Studies   No results found.  Assessment       PLAN   ***   Laureen Ochs. Bobby Rumpf, [place], [facility]

## 2022-01-22 ENCOUNTER — Ambulatory Visit (INDEPENDENT_AMBULATORY_CARE_PROVIDER_SITE_OTHER): Payer: PPO | Admitting: Gastroenterology

## 2022-01-22 ENCOUNTER — Encounter: Payer: Self-pay | Admitting: Gastroenterology

## 2022-01-22 VITALS — BP 114/70 | HR 76 | Temp 97.7°F | Ht 62.5 in | Wt 149.8 lb

## 2022-01-22 DIAGNOSIS — K219 Gastro-esophageal reflux disease without esophagitis: Secondary | ICD-10-CM

## 2022-01-22 DIAGNOSIS — K5900 Constipation, unspecified: Secondary | ICD-10-CM

## 2022-01-22 NOTE — Patient Instructions (Addendum)
Sounds like your reflux and constipation are doing well at this time. I would encourage you to continue your Prevacid and miralax as before.  Your labs from last week show no anemia!Marland Kitchen Keep your upcoming appointment this week at the [facility].  Please talk with Dr. Criss Rosales about your sensation of heart palpitations and fatigue.  We will see you back in six months.

## 2022-01-23 DIAGNOSIS — F5102 Adjustment insomnia: Secondary | ICD-10-CM | POA: Diagnosis not present

## 2022-01-23 DIAGNOSIS — E1169 Type 2 diabetes mellitus with other specified complication: Secondary | ICD-10-CM | POA: Diagnosis not present

## 2022-01-23 NOTE — Progress Notes (Signed)
Virtual Visit via Telephone Note [facility]  I connected with Nancy Liu  on 01/24/22 at  1:00 PM by telephone and verified that I am speaking with the correct person using two identifiers.  Location: Patient: Home Provider: [facility]   I discussed the limitations, risks, security and privacy concerns of performing an evaluation and management service by telephone and the availability of in person appointments. I also discussed with the patient that there may be a patient responsible charge related to this service. The patient expressed understanding and agreed to proceed.  REASON FOR VISIT: Iron deficiency anemia and history of multifocal right breast DCIS s/p left mastectomy in September 2022  CURRENT THERAPY: Oral ferrous sulfate with IV iron as needed  INTERVAL HISTORY: Nancy Liu is contacted today for follow-up of iron deficiency anemia.  She was last seen by Tarri Abernethy PA-C on 09/23/2021.  She has been following with Dr. Burr Medico and Cira Rue NP for her breast cancer at [facility], but has requested to transfer care to [facility] due to closer proximity to her home.  Last visit with [facility] was on 01/15/2022.  At today's visit, she reports feeling fairly well.  She is taking iron tablet once daily and vitamin D3 1000 units daily.  She continues to have chronically dark bowel movements after starting iron pills, but denies any gross melena or hematochezia.  She reports fatigue with energy about 50%. She has some dyspnea on exertion and lightheadedness. She denies any pica, restless legs, headache, chest pain, or syncope.  She has 75% appetite that waxes and wanes. She endorses that she is maintaining a stable weight.    OBSERVATIONS/OBJECTIVE:   Review of Systems  Constitutional:  Positive for malaise/fatigue. Negative for chills, diaphoresis, fever and weight loss.  Respiratory:   Positive for shortness of breath (with exertion). Negative for cough.   Cardiovascular:  Positive for palpitations. Negative for chest pain.  Gastrointestinal:  Negative for abdominal pain, blood in stool, melena, nausea and vomiting.  Neurological:  Positive for tingling. Negative for dizziness and headaches.  Psychiatric/Behavioral:  The patient is nervous/anxious.      PHYSICAL EXAM (per limitations of virtual telephone visit): The patient is alert and oriented x 3, exhibiting adequate mentation, good mood, and ability to speak in full sentences and execute sound judgement.   ASSESSMENT & PLAN: 1.  Microcytic anemia - Secondary to iron deficiency. - She may have some element of thalassemia minor, as she has persistent microcytosis and elevated RBC.  Hemoglobin fractionation showed hemoglobin variant A2 prime, which is unlikely to cause any clinical or hematologically significant conditions - Patient has no M spike on SPEP. - She was Hemoccult stool negative in March 2022. - GI capsule study in May 2022 with few gastric and small bowel erosions and possible small bowel AVM without any active bleeding. - EGD (02/28/2020): Gastritis, otherwise normal - Colonoscopy (03/03/2016): Diverticulosis, nonbleeding internal hemorrhoids - She last received Feraheme on 01/04/2021 and 01/11/2021. - She is taking iron tablet once daily - She continues to have some chronic fatigue.     - Most recent labs (01/17/2022): Hgb 12.3/MCV 80.0, ferritin 125, iron saturation 16%. - PLAN: We will schedule her for IV Feraheme x 1 dose due to symptomatic iron deficiency with saturation <20% - Repeat labs and RTC in 6 months.     2.  Vitamin D deficiency - Patient was previously  taking vitamin D 50,000 units weekly, noted to have elevated vitamin D 133.41 (05/13/2021), therefore vitamin D was held. - Most recent vitamin D (01/17/2022): Vitamin D 45.59 - Restarted on vitamin D 3,000 units daily in August 2023  - PLAN:  Continue taking vitamin D3 1000 units daily.  Recheck at follow-up in 6 months.  3.  Multifocal right breast DCIS, grade 2, ER/PR +  - She is being managed by Dr. Burr Medico at [facility] - S/p right mastectomy, which showed multifocal intermediate grade DCIS, surgical margins negative.  Also showed atypical lobular hyperplasia, which is high risk for future breast cancer. - Per note by Dr. Burr Medico from 03/13/2021, patient will not be receiving any adjuvant antiestrogen therapy - Patient is transferring care to [facility] due to closer proximity to home.  Last visit at [facility] with Cira Rue NP on 01/15/2022. - PLAN: Per note by NP Cira Rue (01/15/2022): Breast MRI October 2023  Left mammogram April 2024 (will schedule after next follow up visit) - We will plan on office visit in February 2023 with labs and physical exam     I discussed the assessment and treatment plan with the patient. The patient was provided an opportunity to ask questions and all were answered. The patient agreed with the plan and demonstrated an understanding of the instructions.   The patient was advised to call back or seek an in-person evaluation if the symptoms worsen or if the condition fails to improve as anticipated.  I provided 22 minutes of non-face-to-face time during this encounter.   Harriett Rush, PA-C 01/24/22 1:24 PM

## 2022-01-24 ENCOUNTER — Encounter: Payer: Self-pay | Admitting: Physician Assistant

## 2022-01-24 ENCOUNTER — Other Ambulatory Visit: Payer: Self-pay

## 2022-01-24 ENCOUNTER — Inpatient Hospital Stay (HOSPITAL_BASED_OUTPATIENT_CLINIC_OR_DEPARTMENT_OTHER): Payer: PPO | Admitting: Physician Assistant

## 2022-01-24 DIAGNOSIS — D0511 Intraductal carcinoma in situ of right breast: Secondary | ICD-10-CM | POA: Diagnosis not present

## 2022-01-24 DIAGNOSIS — D500 Iron deficiency anemia secondary to blood loss (chronic): Secondary | ICD-10-CM

## 2022-01-24 DIAGNOSIS — E559 Vitamin D deficiency, unspecified: Secondary | ICD-10-CM | POA: Diagnosis not present

## 2022-01-27 ENCOUNTER — Other Ambulatory Visit: Payer: Self-pay | Admitting: Cardiology

## 2022-01-27 DIAGNOSIS — Z955 Presence of coronary angioplasty implant and graft: Secondary | ICD-10-CM | POA: Diagnosis not present

## 2022-01-27 DIAGNOSIS — I2510 Atherosclerotic heart disease of native coronary artery without angina pectoris: Secondary | ICD-10-CM | POA: Diagnosis not present

## 2022-01-27 DIAGNOSIS — F419 Anxiety disorder, unspecified: Secondary | ICD-10-CM | POA: Diagnosis not present

## 2022-01-27 DIAGNOSIS — W19XXXD Unspecified fall, subsequent encounter: Secondary | ICD-10-CM | POA: Diagnosis not present

## 2022-01-27 DIAGNOSIS — Z7984 Long term (current) use of oral hypoglycemic drugs: Secondary | ICD-10-CM | POA: Diagnosis not present

## 2022-01-27 DIAGNOSIS — M069 Rheumatoid arthritis, unspecified: Secondary | ICD-10-CM | POA: Diagnosis not present

## 2022-01-27 DIAGNOSIS — R269 Unspecified abnormalities of gait and mobility: Secondary | ICD-10-CM | POA: Diagnosis not present

## 2022-01-27 DIAGNOSIS — E119 Type 2 diabetes mellitus without complications: Secondary | ICD-10-CM | POA: Diagnosis not present

## 2022-01-27 DIAGNOSIS — I10 Essential (primary) hypertension: Secondary | ICD-10-CM | POA: Diagnosis not present

## 2022-01-27 DIAGNOSIS — R42 Dizziness and giddiness: Secondary | ICD-10-CM | POA: Diagnosis not present

## 2022-01-30 ENCOUNTER — Inpatient Hospital Stay: Payer: PPO

## 2022-01-30 VITALS — BP 114/61 | HR 76 | Temp 97.0°F | Resp 18

## 2022-01-30 DIAGNOSIS — D500 Iron deficiency anemia secondary to blood loss (chronic): Secondary | ICD-10-CM

## 2022-01-30 DIAGNOSIS — E7849 Other hyperlipidemia: Secondary | ICD-10-CM | POA: Diagnosis not present

## 2022-01-30 DIAGNOSIS — D0511 Intraductal carcinoma in situ of right breast: Secondary | ICD-10-CM | POA: Diagnosis not present

## 2022-01-30 DIAGNOSIS — I10 Essential (primary) hypertension: Secondary | ICD-10-CM | POA: Diagnosis not present

## 2022-01-30 DIAGNOSIS — E119 Type 2 diabetes mellitus without complications: Secondary | ICD-10-CM | POA: Diagnosis not present

## 2022-01-30 MED ORDER — LORATADINE 10 MG PO TABS
10.0000 mg | ORAL_TABLET | Freq: Once | ORAL | Status: AC
  Administered 2022-01-30: 10 mg via ORAL
  Filled 2022-01-30: qty 1

## 2022-01-30 MED ORDER — ACETAMINOPHEN 325 MG PO TABS
650.0000 mg | ORAL_TABLET | Freq: Once | ORAL | Status: AC
  Administered 2022-01-30: 650 mg via ORAL

## 2022-01-30 MED ORDER — SODIUM CHLORIDE 0.9 % IV SOLN
510.0000 mg | Freq: Once | INTRAVENOUS | Status: AC
  Administered 2022-01-30: 510 mg via INTRAVENOUS
  Filled 2022-01-30: qty 510

## 2022-01-30 MED ORDER — SODIUM CHLORIDE 0.9 % IV SOLN: Freq: Once | INTRAVENOUS | Status: AC

## 2022-01-30 NOTE — Patient Instructions (Signed)
[facility]  Discharge Instructions: Thank you for choosing [facility] to provide your oncology and hematology care.  If you have a lab appointment with the Cancer Center, please come in thru the Main Entrance and check in at the main information desk.  Wear comfortable clothing and clothing appropriate for easy access to any Portacath or PICC line.   We strive to give you quality time with your provider. You may need to reschedule your appointment if you arrive late (15 or more minutes).  Arriving late affects you and other patients whose appointments are after yours.  Also, if you miss three or more appointments without notifying the office, you may be dismissed from the clinic at the provider's discretion.      For prescription refill requests, have your pharmacy contact our office and allow 72 hours for refills to be completed.    Today you received the following chemotherapy and/or immunotherapy agents Feraheme      To help prevent nausea and vomiting after your treatment, we encourage you to take your nausea medication as directed.  BELOW ARE SYMPTOMS THAT SHOULD BE REPORTED IMMEDIATELY: *FEVER GREATER THAN 100.4 F (38 C) OR HIGHER *CHILLS OR SWEATING *NAUSEA AND VOMITING THAT IS NOT CONTROLLED WITH YOUR NAUSEA MEDICATION *UNUSUAL SHORTNESS OF BREATH *UNUSUAL BRUISING OR BLEEDING *URINARY PROBLEMS (pain or burning when urinating, or frequent urination) *BOWEL PROBLEMS (unusual diarrhea, constipation, pain near the anus) TENDERNESS IN MOUTH AND THROAT WITH OR WITHOUT PRESENCE OF ULCERS (sore throat, sores in mouth, or a toothache) UNUSUAL RASH, SWELLING OR PAIN  UNUSUAL VAGINAL DISCHARGE OR ITCHING   Items with * indicate a potential emergency and should be followed up as soon as possible or go to the Emergency Department if any problems should occur.  Please show the CHEMOTHERAPY ALERT CARD or IMMUNOTHERAPY ALERT CARD at check-in to the  Emergency Department and triage nurse.  Should you have questions after your visit or need to cancel or reschedule your appointment, please contact [facility] [phone]  and follow the prompts.  Office hours are 8:00 a.m. to 4:30 p.m. Monday - Friday. Please note that voicemails left after 4:00 p.m. may not be returned until the following business day.  We are closed weekends and major holidays. You have access to a nurse at all times for urgent questions. Please call the main number to the clinic [phone] and follow the prompts.  For any non-urgent questions, you may also contact your provider using MyChart. We now offer e-Visits for anyone 18 and older to request care online for non-urgent symptoms. For details visit [email or web address].   Also download the MyChart app! Go to the app store, search "MyChart", open the app, select [facility], and log in with your MyChart username and password.  Masks are optional in the cancer centers. If you would like for your care team to wear a mask while they are taking care of you, please let them know. You may have one support person who is at least 80 years old accompany you for your appointments.  

## 2022-01-30 NOTE — Progress Notes (Signed)
Patient presents today for Feraheme IV iron infusion. Vital signs are stable. Patient denies any changes since the last iron infusion. Patient denies any complaints today. MAR reviewed and updated.    Feraheme given today per MD orders. Tolerated infusion without adverse affects. Vital signs stable. No complaints at this time. Discharged from clinic ambulatory in stable condition. Alert and oriented x 3. F/U with [facility] as scheduled.

## 2022-02-05 DIAGNOSIS — R269 Unspecified abnormalities of gait and mobility: Secondary | ICD-10-CM | POA: Diagnosis not present

## 2022-02-05 DIAGNOSIS — Z7984 Long term (current) use of oral hypoglycemic drugs: Secondary | ICD-10-CM | POA: Diagnosis not present

## 2022-02-05 DIAGNOSIS — E119 Type 2 diabetes mellitus without complications: Secondary | ICD-10-CM | POA: Diagnosis not present

## 2022-02-05 DIAGNOSIS — I10 Essential (primary) hypertension: Secondary | ICD-10-CM | POA: Diagnosis not present

## 2022-02-05 DIAGNOSIS — Z955 Presence of coronary angioplasty implant and graft: Secondary | ICD-10-CM | POA: Diagnosis not present

## 2022-02-05 DIAGNOSIS — M069 Rheumatoid arthritis, unspecified: Secondary | ICD-10-CM | POA: Diagnosis not present

## 2022-02-05 DIAGNOSIS — W19XXXD Unspecified fall, subsequent encounter: Secondary | ICD-10-CM | POA: Diagnosis not present

## 2022-02-05 DIAGNOSIS — F419 Anxiety disorder, unspecified: Secondary | ICD-10-CM | POA: Diagnosis not present

## 2022-02-05 DIAGNOSIS — I2510 Atherosclerotic heart disease of native coronary artery without angina pectoris: Secondary | ICD-10-CM | POA: Diagnosis not present

## 2022-02-05 DIAGNOSIS — R42 Dizziness and giddiness: Secondary | ICD-10-CM | POA: Diagnosis not present

## 2022-02-06 ENCOUNTER — Encounter: Payer: Self-pay | Admitting: Pulmonary Disease

## 2022-02-06 ENCOUNTER — Ambulatory Visit: Payer: PPO | Admitting: Pulmonary Disease

## 2022-02-06 VITALS — BP 118/64 | HR 70 | Temp 97.6°F | Ht 62.5 in | Wt 149.6 lb

## 2022-02-06 DIAGNOSIS — R0683 Snoring: Secondary | ICD-10-CM | POA: Diagnosis not present

## 2022-02-06 DIAGNOSIS — R0609 Other forms of dyspnea: Secondary | ICD-10-CM

## 2022-02-06 DIAGNOSIS — J479 Bronchiectasis, uncomplicated: Secondary | ICD-10-CM | POA: Diagnosis not present

## 2022-02-06 DIAGNOSIS — J301 Allergic rhinitis due to pollen: Secondary | ICD-10-CM

## 2022-02-06 NOTE — Patient Instructions (Signed)
Try using mucinex when you have a cough with chest congestion   Will arrange for home sleep study  Will call to arrange for follow up after sleep study reviewed

## 2022-02-06 NOTE — Progress Notes (Signed)
[facility]  [facility]  Chief Complaint  Patient presents with   Follow-up    Has a cough once in a while still. Feels she has more SOB lately than she usually does.     Constitutional:  BP 118/64 (BP Location: Left Arm, Patient Position: Sitting)   Pulse 70   Temp 97.6 F (36.4 C) (Temporal)   Ht 5' 2.5" (1.588 m)   Wt 149 lb 9.6 oz (67.9 kg)   SpO2 99% Comment: ra  BMI 26.93 kg/m   Past Medical History:  Rt breast cancer, CAD, HTN, HLD, CVA, GERD, Thalassemia minor, GI bleed from AVM  Past Surgical History:  She  has a past surgical history that includes Vaginal hysterectomy (1980); Colonoscopy (2006); Inguinal hernia repair (1970s); Dilation and curettage of uterus; Colonoscopy (2000); Esophagogastroduodenoscopy (2000); Esophagogastroduodenoscopy (2006); Colonoscopy (N/A, 03/03/2016); Esophagogastroduodenoscopy (N/A, 03/03/2016); Esophagogastroduodenoscopy (N/A, 06/03/2016); Excision of keloid (Left, 03/26/2018); Esophagogastroduodenoscopy (egd) with propofol (N/A, 02/28/2020); biopsy (02/28/2020); Givens capsule study (N/A, 10/10/2020); and Total mastectomy (Right, 02/20/2021).  Brief Summary:  Nancy Liu is a 81 y.o. female former smoker with dyspnea in setting of bronchiectasis      Subjective:   She has been getting winded walking up stairs.  Feels better after resting for about a minute.  Not having chest pain or palpitations.  Has cough with chest congestion.  Brings up clear sputum.  Hasn't been using mucinex or albuterol.  Not having fever, hemoptysis, or wheezing.  She gets ankle swelling if she stands too long.  She is having more trouble with her sleep.  She is snoring.  She is getting vivid dreams and nightmares.  She has started walking in her sleep.  She wakes up in the early morning hours with a cough and feels anxious.  She is feeling more sleepy during day.  Physical Exam:   Appearance - well kempt   ENMT - no sinus  tenderness, no oral exudate, no LAN, Mallampati 3 airway, no stridor  Respiratory - equal breath sounds bilaterally, no wheezing or rales  CV - s1s2 regular rate and rhythm, no murmurs  Ext - no clubbing, no edema  Skin - no rashes  Psych - normal mood and affect      Pulmonary testing:  PFT 06/13/15 >> FEV1 1.83 (112%), FEV1% 85, TLC 3.82 (79%), DLCO 47% IgE 10/13/19 >> 16 IgG/IgA/IgM 10/13/19 >> 735/168/27 Serology 10/13/19 >> RF/ANA/ANCA negative PFT 11/08/19 >> FEV1 1.80 (118%), FEV1% 89, TLC 3.91 (81%), DLCO 77%  Chest Imaging:  HRCT chest 09/03/15 >> few scattered nodules up to 9 mm, mild GGO and minimal BTX with distortion in lingula and lower lobes from scarring HRCT chest 10/20/19 >> atherosclerosis, tubular BTX b/l lower lobes, scarring LLL and lingular, scattered nodules up to 9 mm stable since 2017/benign  Sleep Tests:  PSG 09/09/17 >> AHI 8.6, SpO2 low 87%  Cardiac Tests:  Echo 10/08/21 >> EF 60 to 65%, grade 1 DD  Social History:  She  reports that she quit smoking about 41 years ago. Her smoking use included cigarettes. She started smoking about 56 years ago. She has a 18.00 pack-year smoking history. She has never used smokeless tobacco. She reports that she does not drink alcohol and does not use drugs.  Family History:  Her family history includes Breast cancer (age of onset: 65) in her half-sister; Diabetes in her half-sister; Heart disease in her mother; Lung cancer in her mother; Multiple myeloma (age of onset: 17)  in her nephew; Ovarian cancer in her mother.     Assessment/Plan:   Bronchiectasis, history of smoking and reflux. - advised her to try mucinex and albuterol when she has more cough and chest congestion  Dyspnea on exertion. - suspect this is related to deconditioning - she would like to defer additional testing at this time  Allergic rhinitis with post nasal drip. - prn flonase, zyrtec  Snoring with history of obstructive sleep apnea. - she  has progressive snoring, sleep disruption, apnea and daytime sleepiness - she has a history of hypertension, coronary artery disease, and stroke - will arrange for a home sleep study to assess further  Time Spent Involved in Patient Care on Day of Examination:  38 minutes  Follow up:   Patient Instructions  Try using mucinex when you have a cough with chest congestion   Will arrange for home sleep study  Will call to arrange for follow up after sleep study reviewed   Medication List:   Allergies as of 02/06/2022       Reactions   Protonix [pantoprazole Sodium]    DRY LIPS        Medication List        Accurate as of February 06, 2022  9:49 AM. If you have any questions, ask your nurse or doctor.          acetaminophen 500 MG tablet Commonly known as: TYLENOL Take 1,000 mg by mouth every 6 (six) hours as needed for moderate pain or headache.   albuterol 108 (90 Base) MCG/ACT inhaler Commonly known as: VENTOLIN HFA Inhale 2 puffs into the lungs every 6 (six) hours as needed for wheezing or shortness of breath.   amLODipine 10 MG tablet Commonly known as: NORVASC TAKE ONE TABLET BY MOUTH EVERYDAY AT BEDTIME   aspirin EC 81 MG tablet Take 81 mg by mouth daily. Swallow whole.   carvedilol 6.25 MG tablet Commonly known as: COREG TAKE ONE TABLET BY MOUTH BEFORE BREAKFAST and TAKE ONE TABLET BY MOUTH EVERYDAY AT BEDTIME   doxepin 10 MG capsule Commonly known as: SINEQUAN Take 10 mg by mouth at bedtime as needed.   irbesartan-hydrochlorothiazide 300-12.5 MG tablet Commonly known as: AVALIDE Take 1 tablet by mouth daily before breakfast.   lansoprazole 30 MG capsule Commonly known as: PREVACID Take 30 mg daily before breakfast and you can take additional dose 74m before evening meal if needed.   metFORMIN 500 MG tablet Commonly known as: GLUCOPHAGE Take 500 mg by mouth at bedtime. What changed: Another medication with the same name was removed. Continue  taking this medication, and follow the directions you see here. Changed by: VChesley Mires MD   Norel AD 4-10-325 MG Tabs Generic drug: Chlorphen-PE-Acetaminophen Take 1 tablet by mouth 4 (four) times daily.   polyethylene glycol powder 17 GM/SCOOP powder Commonly known as: GLYCOLAX/MIRALAX Take 1/2 capful daily as needed. Make sure to take a dose if you go more than 48 hours without a bowel movement.   rosuvastatin 20 MG tablet Commonly known as: CRESTOR TAKE ONE TABLET BY MOUTH EVERYDAY AT BEDTIME   triamcinolone ointment 0.5 % Commonly known as: KENALOG Apply 1 application  topically 2 (two) times daily as needed (rash).   triamcinolone ointment 0.1 % Commonly known as: KENALOG Apply topically 2 (two) times daily as needed.   Turmeric 500 MG Caps Take 500 mg by mouth daily. What changed: Another medication with the same name was removed. Continue taking this medication, and follow  the directions you see here. Changed by: Chesley Mires, MD   Vitamin D3 75 MCG (3000 UT) Tabs Take 1 tablet by mouth daily.   Vitamin D (Cholecalciferol) 25 MCG (1000 UT) Tabs SMARTSIG:By Mouth        Signature:  Chesley Mires, MD [place] Pager - [phone] 02/06/2022, 9:49 AM

## 2022-02-12 DIAGNOSIS — Z9011 Acquired absence of right breast and nipple: Secondary | ICD-10-CM | POA: Diagnosis not present

## 2022-02-14 ENCOUNTER — Telehealth: Payer: Self-pay | Admitting: Pulmonary Disease

## 2022-02-14 DIAGNOSIS — R0683 Snoring: Secondary | ICD-10-CM

## 2022-02-17 NOTE — Telephone Encounter (Signed)
Called and spoke to patient and she states that she would like to have her study done in the hospital instead of at home. She states she is up and down all night at home during the night because she lives alone and is nervous. She asks that we order the study to be done in the hospital sleep lab so that the test is more accurate.  I did notify patient that insurance may not approve this and it may take longer to get scheduled since we may need a prior authorization and she is okay with this.   Dr. Halford Chessman please advise, are you okay with [place] ordering an in lab study for patient.

## 2022-02-17 NOTE — Telephone Encounter (Signed)
Okay to put order for split night sleep study.

## 2022-02-18 NOTE — Telephone Encounter (Signed)
Order placed. Nothing further needed. 

## 2022-02-19 DIAGNOSIS — Z9011 Acquired absence of right breast and nipple: Secondary | ICD-10-CM | POA: Diagnosis not present

## 2022-03-03 ENCOUNTER — Ambulatory Visit: Payer: PPO | Admitting: Pulmonary Disease

## 2022-03-12 DIAGNOSIS — F419 Anxiety disorder, unspecified: Secondary | ICD-10-CM | POA: Diagnosis not present

## 2022-03-12 DIAGNOSIS — R269 Unspecified abnormalities of gait and mobility: Secondary | ICD-10-CM | POA: Diagnosis not present

## 2022-03-12 DIAGNOSIS — Z7984 Long term (current) use of oral hypoglycemic drugs: Secondary | ICD-10-CM | POA: Diagnosis not present

## 2022-03-12 DIAGNOSIS — I10 Essential (primary) hypertension: Secondary | ICD-10-CM | POA: Diagnosis not present

## 2022-03-12 DIAGNOSIS — M069 Rheumatoid arthritis, unspecified: Secondary | ICD-10-CM | POA: Diagnosis not present

## 2022-03-12 DIAGNOSIS — E119 Type 2 diabetes mellitus without complications: Secondary | ICD-10-CM | POA: Diagnosis not present

## 2022-03-12 DIAGNOSIS — I2510 Atherosclerotic heart disease of native coronary artery without angina pectoris: Secondary | ICD-10-CM | POA: Diagnosis not present

## 2022-03-12 DIAGNOSIS — R42 Dizziness and giddiness: Secondary | ICD-10-CM | POA: Diagnosis not present

## 2022-03-13 DIAGNOSIS — R634 Abnormal weight loss: Secondary | ICD-10-CM | POA: Diagnosis not present

## 2022-03-13 DIAGNOSIS — Z23 Encounter for immunization: Secondary | ICD-10-CM | POA: Diagnosis not present

## 2022-03-13 DIAGNOSIS — G4739 Other sleep apnea: Secondary | ICD-10-CM | POA: Diagnosis not present

## 2022-03-13 DIAGNOSIS — E0800 Diabetes mellitus due to underlying condition with hyperosmolarity without nonketotic hyperglycemic-hyperosmolar coma (NKHHC): Secondary | ICD-10-CM | POA: Diagnosis not present

## 2022-03-13 DIAGNOSIS — E785 Hyperlipidemia, unspecified: Secondary | ICD-10-CM | POA: Diagnosis not present

## 2022-03-13 DIAGNOSIS — I10 Essential (primary) hypertension: Secondary | ICD-10-CM | POA: Diagnosis not present

## 2022-03-17 ENCOUNTER — Other Ambulatory Visit: Payer: Self-pay | Admitting: Nurse Practitioner

## 2022-03-18 ENCOUNTER — Telehealth: Payer: Self-pay | Admitting: Pulmonary Disease

## 2022-03-20 DIAGNOSIS — I10 Essential (primary) hypertension: Secondary | ICD-10-CM | POA: Diagnosis not present

## 2022-03-20 DIAGNOSIS — E1169 Type 2 diabetes mellitus with other specified complication: Secondary | ICD-10-CM | POA: Diagnosis not present

## 2022-03-20 DIAGNOSIS — E7849 Other hyperlipidemia: Secondary | ICD-10-CM | POA: Diagnosis not present

## 2022-03-20 DIAGNOSIS — E119 Type 2 diabetes mellitus without complications: Secondary | ICD-10-CM | POA: Diagnosis not present

## 2022-03-24 ENCOUNTER — Ambulatory Visit (HOSPITAL_COMMUNITY): Admission: RE | Admit: 2022-03-24 | Payer: PPO | Source: Ambulatory Visit

## 2022-03-25 NOTE — Telephone Encounter (Signed)
Pt scheduled for SS 04/18/22

## 2022-03-31 ENCOUNTER — Ambulatory Visit: Payer: Self-pay

## 2022-03-31 NOTE — Patient Outreach (Signed)
  Care Coordination   Provider Collaboration  Visit Note   03/31/2022 Name: Nancy Liu MRN: [id number] DOB: [birth year]  Nancy Liu is a 81 y.o. year old female who sees Lucianne Lei, MD for primary care. I  spoke with Judeen Hammans, referral coordinator for [facility] today.   What matters to the patients health and wellness today?      Goals Addressed             This Visit's Progress    COMPLETED: Care Coordination Activities: no further follow up needed       Care Coordination Interventions: Collaboration with Lorne Skeens, PCP referral coordinator regarding assistance needed to help locate an in network provider to evaluate and treat patient's depression and anxiety Provided Judeen Hammans with the care management email and contact information for [facility] to help locate such provider           SDOH assessments and interventions completed:  No     Care Coordination Interventions Activated:  Yes  Care Coordination Interventions:  Yes, provided   Follow up plan: No further intervention required.   Encounter Outcome:  Pt. Visit Completed

## 2022-04-01 DIAGNOSIS — H04123 Dry eye syndrome of bilateral lacrimal glands: Secondary | ICD-10-CM | POA: Diagnosis not present

## 2022-04-01 DIAGNOSIS — H47233 Glaucomatous optic atrophy, bilateral: Secondary | ICD-10-CM | POA: Diagnosis not present

## 2022-04-01 DIAGNOSIS — H401131 Primary open-angle glaucoma, bilateral, mild stage: Secondary | ICD-10-CM | POA: Diagnosis not present

## 2022-04-01 DIAGNOSIS — H2513 Age-related nuclear cataract, bilateral: Secondary | ICD-10-CM | POA: Diagnosis not present

## 2022-04-03 ENCOUNTER — Telehealth: Payer: Self-pay

## 2022-04-03 NOTE — Telephone Encounter (Signed)
Pt called asking to speak with Cira Rue, NP regarding her depression and weight loss.  Pt is a breast cancer pt that had a mastectomy 2022 and is now on surveillance.  Pt's care was transferred to Tarri Abernethy, PA-C at [facility] since it's closer to the pt's home based off [place] last office note.  Pt has been seen by Tarri Abernethy after Lacie's referral.  Pt stated she's reached out to her PCP for guidance but have not heard from her PCP.  Pt stated she was told she would be referred to someone regarding her depression but has not heard anything else about the referral.  Pt stated she's steadily losing weight due to lack of appetite.  Asked pt if she had reached out to Silvis Pennington's office regarding her concerns.  Pt stated "No".  Instructed pt to contact Rebekah's office and her PCP's office regarding her depression and weight loss.  Notified Cira Rue, NP and Tarri Abernethy, PA-C of the pt's call.

## 2022-04-04 DIAGNOSIS — R251 Tremor, unspecified: Secondary | ICD-10-CM | POA: Diagnosis not present

## 2022-04-04 DIAGNOSIS — R634 Abnormal weight loss: Secondary | ICD-10-CM | POA: Diagnosis not present

## 2022-04-04 DIAGNOSIS — I10 Essential (primary) hypertension: Secondary | ICD-10-CM | POA: Diagnosis not present

## 2022-04-04 DIAGNOSIS — E782 Mixed hyperlipidemia: Secondary | ICD-10-CM | POA: Diagnosis not present

## 2022-04-04 DIAGNOSIS — F323 Major depressive disorder, single episode, severe with psychotic features: Secondary | ICD-10-CM | POA: Diagnosis not present

## 2022-04-04 NOTE — Telephone Encounter (Signed)
Thank you for passing this on to me.  We will reach out to the patient today to set her up for an office or phone visit to discuss this in more detail.

## 2022-04-08 ENCOUNTER — Inpatient Hospital Stay: Payer: PPO | Attending: Nurse Practitioner | Admitting: Physician Assistant

## 2022-04-08 DIAGNOSIS — F32A Depression, unspecified: Secondary | ICD-10-CM

## 2022-04-08 DIAGNOSIS — R634 Abnormal weight loss: Secondary | ICD-10-CM | POA: Diagnosis not present

## 2022-04-08 NOTE — Progress Notes (Addendum)
Virtual Visit via Telephone Note [facility]  I connected with Nancy Liu  on 04/08/2022 at 9:20 AM by telephone and verified that I am speaking with the correct person using two identifiers.  Location: Patient: Home Provider: [facility]   I discussed the limitations, risks, security and privacy concerns of performing an evaluation and management service by telephone and the availability of in person appointments. I also discussed with the patient that there may be a patient responsible charge related to this service. The patient expressed understanding and agreed to proceed.  REASON FOR VISIT: Patient concerns regarding depression and weight loss with history of multifocal right breast DCIS s/p left mastectomy (September 2022) and iron deficiency anemia  INTERVAL HISTORY: Nancy Liu is contacted today to discuss her concerns regarding depression and weight loss, in the context of history of multifocal right breast DCIS s/p left mastectomy in September 2022.  She was last evaluated by telemedicine visit by Tarri Abernethy PA-C on 01/24/2022.  She reports that she has lost about 25 pounds since May 2023.  She attributes this to depression, and reports that she "has completely lost her appetite and has no interest in eating."  She has been trying to force herself to eat, is only eating about 50% of her previous oral intake.  She drinks Ensure once daily.  She has been feeling depressed "ever since COVID," which she thinks may be related to having several family and friends die during that timeframe.  She reports that she has been afraid to leave the house.  She does not feel that her depression was worsened by her breast cancer diagnosis.  She reports some mild anxiety regarding future breast cancer recurrent.  She denies any suicidal ideation or intent.  She reports difficulty sleeping and frequent nightmares.  She was recently started on  escitalopram 10 mg daily by her PCP office and is in the process of finding a therapist.  Her PCP is also closely following her weight loss, with daily weight checks and reports to PCP office every 3 days.    OBSERVATIONS/OBJECTIVE: Review of Systems  Constitutional:  Negative for chills, diaphoresis, fever, malaise/fatigue and weight loss.  Respiratory:  Positive for cough. Negative for shortness of breath.   Cardiovascular:  Negative for chest pain and palpitations.  Gastrointestinal:  Negative for abdominal pain, blood in stool, melena, nausea and vomiting.  Neurological:  Positive for dizziness. Negative for headaches.  Psychiatric/Behavioral:  Positive for depression. The patient is nervous/anxious.      PHYSICAL EXAM (per limitations of virtual telephone visit): The patient is alert and oriented x 3, exhibiting adequate mentation, good mood, and ability to speak in full sentences and execute sound judgement.   ASSESSMENT & PLAN: 1.  Depression and weight loss - Reports worsening depression over the past several years - Decreased appetite and oral intake secondary to depression - Started on citalopram 10 mg daily by PCP last week - Patient is in the process of finding therapist - Oral intake is about 50% of previous oral intake, she is drinking Ensure once daily - She denies any suicidal ideation/intent or thoughts of self-harm - PLAN: Offered reassurance to patient that her primary care doctor is appropriately handling her depression and weight loss. - Encouraged her to continue citalopram 10 mg daily and to continue searching for a therapist. - Encouraged her to continue "forcing herself to eat" and drinking at least 2 Ensure daily until she starts  to regain some of the weight she lost. - We will see her for follow-up visit in February 2024, but if she has any new concerns prior to that time, she is encouraged to call our office for further discussion.  2.  OTHER ISSUES (not  specifically addressed during this visit - see note from 01/24/2022): - Iron deficiency anemia: Scheduled for labs and office visit in February 2024 - Vitamin D deficiency: Taking vitamin D 1000 units daily, scheduled for labs and office visit in February 2024 - Multifocal right breast DCIS, grade 2: Breast MRI in December 2024, labs and office visit in February 2024   FOLLOW UP INSTRUCTIONS: -Scheduled for MR left breast 05/05/2022 - Scheduled for repeat labs  (CBC/D, ferritin, iron/TIBC, vitamin D, CMP) on 07/23/2022 - She is scheduled for office visit including physical exam on 07/30/2022    I discussed the assessment and treatment plan with the patient. The patient was provided an opportunity to ask questions and all were answered. The patient agreed with the plan and demonstrated an understanding of the instructions.   The patient was advised to call back or seek an in-person evaluation if the symptoms worsen or if the condition fails to improve as anticipated.  I provided 22 minutes of non-face-to-face time during this encounter.   Harriett Rush, PA-C 04/08/22 10:15 AM

## 2022-04-09 DIAGNOSIS — E1169 Type 2 diabetes mellitus with other specified complication: Secondary | ICD-10-CM | POA: Diagnosis not present

## 2022-04-18 ENCOUNTER — Ambulatory Visit (HOSPITAL_BASED_OUTPATIENT_CLINIC_OR_DEPARTMENT_OTHER): Payer: PPO | Attending: Pulmonary Disease | Admitting: Pulmonary Disease

## 2022-04-18 VITALS — Ht 62.0 in | Wt 140.0 lb

## 2022-04-18 DIAGNOSIS — R5383 Other fatigue: Secondary | ICD-10-CM | POA: Insufficient documentation

## 2022-04-18 DIAGNOSIS — I493 Ventricular premature depolarization: Secondary | ICD-10-CM | POA: Insufficient documentation

## 2022-04-18 DIAGNOSIS — E119 Type 2 diabetes mellitus without complications: Secondary | ICD-10-CM | POA: Diagnosis not present

## 2022-04-18 DIAGNOSIS — R0683 Snoring: Secondary | ICD-10-CM | POA: Diagnosis not present

## 2022-04-18 DIAGNOSIS — I10 Essential (primary) hypertension: Secondary | ICD-10-CM | POA: Insufficient documentation

## 2022-04-28 ENCOUNTER — Inpatient Hospital Stay (HOSPITAL_COMMUNITY): Admission: RE | Admit: 2022-04-28 | Payer: PPO | Source: Ambulatory Visit

## 2022-04-28 ENCOUNTER — Telehealth: Payer: Self-pay | Admitting: Pulmonary Disease

## 2022-04-28 DIAGNOSIS — R0683 Snoring: Secondary | ICD-10-CM | POA: Diagnosis not present

## 2022-04-28 NOTE — Telephone Encounter (Signed)
Spoke with patient regarding hst results. They verbalized understanding. No further questions. Recall placed for 6 month fu  Nothing further needed at this time.

## 2022-04-28 NOTE — Telephone Encounter (Signed)
PSG 04/18/22 >> AHI 1.8, SpO2 low 83%.   Please let her know her sleep study showed that she snores and talks in her sleep.  It did not show sleep apnea.  She just needs to have a follow up visit in 6 months for continued management of bronchiectasis.

## 2022-04-28 NOTE — Procedures (Signed)
     Patient Name: Nancy Liu, Enriques Date: 04/18/2022 Gender: Female D.O.B: [birth year] Age (years): 66 Referring Provider: Chesley Mires MD, ABSM Height (inches): 62 Interpreting Physician: Chesley Mires MD, ABSM Weight (lbs): 140 RPSGT: Jorge Ny BMI: 26 MRN: [id number] Neck Size: 12.75  CLINICAL INFORMATION Sleep Study Type: NPSG  Indication for sleep study: Diabetes, Fatigue, Hypertension, Snoring  Epworth Sleepiness Score: 8  SLEEP STUDY TECHNIQUE As per the AASM Manual for the Scoring of Sleep and Associated Events v2.3 (April 2016) with a hypopnea requiring 4% desaturations.  The channels recorded and monitored were frontal, central and occipital EEG, electrooculogram (EOG), submentalis EMG (chin), nasal and oral airflow, thoracic and abdominal wall motion, anterior tibialis EMG, snore microphone, electrocardiogram, and pulse oximetry.  MEDICATIONS Medications self-administered by patient taken the night of the study : N/A  SLEEP ARCHITECTURE The study was initiated at 10:37:32 PM and ended at 5:00:24 AM.  Sleep onset time was 35.1 minutes and the sleep efficiency was 70.1%%. The total sleep time was 268.3 minutes.  Stage REM latency was 87.0 minutes.  The patient spent 5.2%% of the night in stage N1 sleep, 79.1%% in stage N2 sleep, 0.0%% in stage N3 and 15.7% in REM.  Alpha intrusion was absent.  Supine sleep was 59.37%.  RESPIRATORY PARAMETERS The overall apnea/hypopnea index (AHI) was 1.8 per hour. There were 0 total apneas, including 0 obstructive, 0 central and 0 mixed apneas. There were 8 hypopneas and 1 RERAs.  The AHI during Stage REM sleep was 4.3 per hour.  AHI while supine was 1.5 per hour.  The mean oxygen saturation was 92.1%. The minimum SpO2 during sleep was 83.0%.  Moderate snoring was noted during this study.  CARDIAC DATA The 2 lead EKG demonstrated sinus rhythm. The mean heart rate was 68.2 beats per minute. Other EKG  findings include: PVCs.  LEG MOVEMENT DATA The total PLMS were 0 with a resulting PLMS index of 0.0. Associated arousal with leg movement index was 0.0 .  IMPRESSIONS - No significant obstructive sleep apnea occurred during this study (AHI = 1.8/h). - Mild oxygen desaturation was noted during this study (Min O2 = 83.0%).  She spent 1 minute of test time with an SpO2 < 88%.  Supplemental oxygen was not needed during this study. - The patient snored with moderate snoring volume. - EKG findings include PVCs. - Clinically significant periodic limb movements did not occur during sleep. No significant associated arousals. - Noted to have episodes of sleep talking.  DIAGNOSIS - Somniloquy. - Snoring.  RECOMMENDATIONS - Avoid alcohol, sedatives and other CNS depressants that may worsen sleep apnea and disrupt normal sleep architecture. - Sleep hygiene should be reviewed to assess factors that may improve sleep quality. - Weight management and regular exercise should be initiated or continued if appropriate.  [Electronically signed] 04/28/2022 03:26 PM  Chesley Mires MD, ABSM Diplomate, American Board of Sleep Medicine NPI: [id number]  [place] PH: [phone]   FX: [phone] [place]

## 2022-04-30 DIAGNOSIS — E1165 Type 2 diabetes mellitus with hyperglycemia: Secondary | ICD-10-CM | POA: Diagnosis not present

## 2022-04-30 DIAGNOSIS — I10 Essential (primary) hypertension: Secondary | ICD-10-CM | POA: Diagnosis not present

## 2022-05-01 ENCOUNTER — Other Ambulatory Visit: Payer: Self-pay | Admitting: Nurse Practitioner

## 2022-05-01 DIAGNOSIS — E1169 Type 2 diabetes mellitus with other specified complication: Secondary | ICD-10-CM | POA: Diagnosis not present

## 2022-05-01 DIAGNOSIS — F323 Major depressive disorder, single episode, severe with psychotic features: Secondary | ICD-10-CM | POA: Diagnosis not present

## 2022-05-01 DIAGNOSIS — E559 Vitamin D deficiency, unspecified: Secondary | ICD-10-CM

## 2022-05-01 DIAGNOSIS — M8580 Other specified disorders of bone density and structure, unspecified site: Secondary | ICD-10-CM

## 2022-05-01 DIAGNOSIS — I10 Essential (primary) hypertension: Secondary | ICD-10-CM | POA: Diagnosis not present

## 2022-05-01 DIAGNOSIS — E785 Hyperlipidemia, unspecified: Secondary | ICD-10-CM | POA: Diagnosis not present

## 2022-05-02 ENCOUNTER — Encounter: Payer: Self-pay | Admitting: Hematology

## 2022-05-02 NOTE — Telephone Encounter (Signed)
Vit d 3 refill-requested by [facility].  RX refill sent to Tarri Abernethy PA-C for refills as she is taking over f/u surveillance of pts  breast cancer.

## 2022-05-05 ENCOUNTER — Ambulatory Visit (HOSPITAL_COMMUNITY)
Admission: RE | Admit: 2022-05-05 | Discharge: 2022-05-05 | Disposition: A | Discharge status: Home / Self Care | Payer: PPO | Source: Ambulatory Visit | Attending: Physician Assistant | Admitting: Physician Assistant

## 2022-05-05 ENCOUNTER — Other Ambulatory Visit: Payer: Self-pay | Admitting: Nurse Practitioner

## 2022-05-05 DIAGNOSIS — Z1239 Encounter for other screening for malignant neoplasm of breast: Secondary | ICD-10-CM | POA: Diagnosis not present

## 2022-05-05 DIAGNOSIS — N6091 Unspecified benign mammary dysplasia of right breast: Secondary | ICD-10-CM | POA: Insufficient documentation

## 2022-05-05 DIAGNOSIS — R9389 Abnormal findings on diagnostic imaging of other specified body structures: Secondary | ICD-10-CM

## 2022-05-05 DIAGNOSIS — D0511 Intraductal carcinoma in situ of right breast: Secondary | ICD-10-CM | POA: Diagnosis not present

## 2022-05-05 MED ORDER — GADOBUTROL 1 MMOL/ML IV SOLN
6.0000 mL | Freq: Once | INTRAVENOUS | Status: AC | PRN
  Administered 2022-05-05: 6 mL via INTRAVENOUS

## 2022-05-12 ENCOUNTER — Ambulatory Visit
Admission: RE | Admit: 2022-05-12 | Discharge: 2022-05-12 | Disposition: A | Discharge status: Home / Self Care | Payer: PPO | Source: Ambulatory Visit | Attending: Nurse Practitioner | Admitting: Nurse Practitioner

## 2022-05-12 DIAGNOSIS — R9389 Abnormal findings on diagnostic imaging of other specified body structures: Secondary | ICD-10-CM

## 2022-05-12 DIAGNOSIS — N6012 Diffuse cystic mastopathy of left breast: Secondary | ICD-10-CM | POA: Diagnosis not present

## 2022-05-12 DIAGNOSIS — N6342 Unspecified lump in left breast, subareolar: Secondary | ICD-10-CM | POA: Diagnosis not present

## 2022-05-12 MED ORDER — GADOPICLENOL 0.5 MMOL/ML IV SOLN
7.0000 mL | Freq: Once | INTRAVENOUS | Status: AC | PRN
  Administered 2022-05-12: 7 mL via INTRAVENOUS

## 2022-05-15 NOTE — Therapy (Signed)
OUTPATIENT PHYSICAL THERAPY THORACOLUMBAR EVALUATION   Patient Name: Nancy Liu MRN: [id number] DOB:[birth year], 81 y.o., female Today's Date: 05/20/2022  END OF SESSION:  PT End of Session - 05/20/22 1210    Visit Number 1    Number of Visits 12    Date for PT Re-Evaluation 07/01/22    Authorization Type [employer]    PT Start Time 1123    PT Stop Time 1205    PT Time Calculation (min) 42 min             Past Medical History:  Diagnosis Date   Arteriosclerotic cardiovascular disease (ASCVD) 2001   Bare-metal stent placed in the right coronary artery in 12/01; residual 50% lesion of the first diagonal and mid LAD   Breast cancer ([place])    Cerebrovascular disease    COPD (chronic obstructive pulmonary disease) (HCC)    Cyst, dermoid, arm, left 03/26/2018   Diabetes mellitus    excellent control with a low-dose of a single oral agent   Family history of breast cancer 01/23/2021   Family history of ovarian cancer 01/23/2021   GERD (gastroesophageal reflux disease)    with ulcers   History of right coronary artery stent placement 2000   Hyperlipidemia    Hypertension    Sleep apnea    Thalassemia minor    Tobacco abuse, in remission    35 pack years; Quit in 1980   Past Surgical History:  Procedure Laterality Date   BIOPSY  02/28/2020   Procedure: BIOPSY;  Surgeon: Eloise Harman, DO;  [facility];  Service: Endoscopy;;   COLONOSCOPY  2006   Dr. Collene Mares: normal   COLONOSCOPY  2000   Dr. Everlean Cherry: normal    COLONOSCOPY N/A 03/03/2016   Dr. Oneida Alar: diverticulosis, non-bleeding hemorrhoids, redundant left colon.   DILATION AND CURETTAGE OF UTERUS     ESOPHAGOGASTRODUODENOSCOPY  2000   Dr. Everlean Cherry: gastritis    ESOPHAGOGASTRODUODENOSCOPY  2006   Dr. Collene Mares: small hiatal hernia, gastritis, negative H.pylori    ESOPHAGOGASTRODUODENOSCOPY N/A 03/03/2016   Procedure: ESOPHAGOGASTRODUODENOSCOPY (EGD);  Surgeon: Danie Binder, MD;  [facility];  Service: Endoscopy;  Laterality: N/A;   ESOPHAGOGASTRODUODENOSCOPY N/A 06/03/2016   Dr. Oneida Alar: nonaggressive gastritis due to aspirin use. Previous ulcers had healed.   ESOPHAGOGASTRODUODENOSCOPY (EGD) WITH PROPOFOL N/A 02/28/2020   focal inflammation in the gastric antrum consistent with gastritis on biopsies. No H.pylori.    EXCISION OF KELOID Left 03/26/2018   Procedure: EXCISION OF LEFT ARM MASS;  Surgeon: Fanny Skates, MD;  Location: [place];  Service: General;  Laterality: Left;   GIVENS CAPSULE STUDY N/A 10/10/2020   Procedure: GIVENS CAPSULE STUDY;  Surgeon: Eloise Harman, DO;  [facility];  Service: Endoscopy;  Laterality: N/A;  7:30am   INGUINAL HERNIA REPAIR  1970s   Right   TOTAL MASTECTOMY Right 02/20/2021   Procedure: RIGHT TOTAL MASTECTOMY;  Surgeon: Rolm Bookbinder, MD;  Location: [place];  Service: General;  Laterality: Right;   VAGINAL HYSTERECTOMY  1980   Unilateral oophorectomy   Patient Active Problem List   Diagnosis Date Noted   Snoring 04/18/2022   GERD (gastroesophageal reflux disease) 03/05/2021   S/P mastectomy, right 02/20/2021   Genetic testing 02/12/2021   Family history of breast cancer 01/23/2021   Family history of ovarian cancer 01/23/2021   Ductal carcinoma in situ (DCIS) of right breast 01/17/2021   Non-insulin dependent type 2 diabetes mellitus ([place])  03/26/2020   Loss of weight 07/13/2019   Constipation 02/10/2017   AVM (arteriovenous malformation) of small bowel, acquired 02/10/2017   Iron deficiency anemia due to chronic blood loss 07/23/2016   Weight loss 04/01/2012   Chest pain on exertion 02/07/2011   Cerebrovascular disease 01/31/2010   CAD S/P percutaneous coronary angioplasty 01/03/2009   Dyslipidemia, goal LDL below 70 10/22/2007   Essential hypertension 10/22/2007    PCP: Lucianne Lei   REFERRING PROVIDER: Lucianne Lei  REFERRING DIAG: PT eval/tx for LBP  Rationale for Evaluation and  Treatment: Rehabilitation  THERAPY DIAG:   LBP Lumbar radiculopathy Muscle weakness  ONSET DATE: 10/31/2021  SUBJECTIVE:                                                                                                                                                                                           SUBJECTIVE STATEMENT: Pt states that she has been having back pain that goes down both her legs for about six months.  The pain will go down to her ankle.  She has recently been dx with Rt breast cancer.   Pt states that she can stand for about 10-15 mintues.  She can walk for 10 to 15 minutes before she has to sit down.  PERTINENT HISTORY:  Hx of recently dx  breast cancer, DM, HTN  PAIN:  Are you having pain? Yes: NPRS scale: 0/10;  worst 5/10 Pain location: Low back into legs  Pain description: aching  Aggravating factors: wt bearing  Relieving factors: rest   PRECAUTIONS: recently dx breast cancer with recent surgery  no prone at this time   WEIGHT BEARING RESTRICTIONS: No  FALLS:  Has patient fallen in last 6 months? Yes. Number of falls 1  LIVING ENVIRONMENT: Lives with: lives with their family Lives in: House/apartment Stairs: Yes: Internal: 16 steps; on right going up Has following equipment at home: None  OCCUPATION: retired   PLOF: Independent  PATIENT GOALS: To be able to walk and stand longer, no pain in her legs.    NEXT MD VISIT:   OBJECTIVE:   PATIENT SURVEYS:  FOTO 69    COGNITION: Overall cognitive status: Within functional limits for tasks assessed     SENSATION: Not tested   PALPATION: Unremarkable   LUMBAR ROM:   AROM eval  Flexion Fingers to toes going down causes increased pain   Extension 25; reps increase pain.  Right lateral flexion   Left lateral flexion   Right rotation   Left rotation    (Blank rows = not tested)  LOWER EXTREMITY MMT:    MMT Right eval Left  eval  Hip flexion 5/5 3/5  Hip extension 2/5 2/5  Hip  abduction 3+/5  3/5  Hip adduction    Hip internal rotation    Hip external rotation    Knee flexion 5/5 5/5  Knee extension 5/5 4/5  Ankle dorsiflexion 5/5 5/5  Ankle plantarflexion    Ankle inversion    Ankle eversion     (Blank rows = not tested)  FUNCTIONAL TESTS:  30 seconds chair stand Liu:  9 reps  2 minute walk Liu: 452 ft    TODAY'S TREATMENT:                                                                                                                              DATE: Evaluation   Supine: Knee to chest stretch 3 x 30" Abdominal set x 10 Bridge x 10    PATIENT EDUCATION:  Education details: HEP Person educated: Patient Education method: Consulting civil engineer, Verbal cues, and Handouts Education comprehension: verbalized understanding and returned demonstration  HOME EXERCISE PROGRAM: Access Code: [email or web address] URL: [email or web address] Date: 05/20/2022 Prepared by: Rayetta Humphrey  Exercises - Supine Transversus Abdominis Bracing - Hands on Stomach  - 2 x daily - 7 x weekly - 1 sets - 10 reps - 5" hold - Supine Bridge  - 2 x daily - 7 x weekly - 1 sets - 10 reps - 5-10" hold - Supine Single Knee to Chest Stretch  - 2 x daily - 7 x weekly - 1 sets - 3 reps - 30" hold  ASSESSMENT:  CLINICAL IMPRESSION: Patient is a 81 y.o. female who was seen today for physical therapy evaluation and treatment for evaluation and treatment for low back pain.  Evaluation demonstrates decreased strength, decrease activity tolerance and increased pain. Ms. Dayley will benefit from skilled PT to address these issues and maximize her functional ability. .   OBJECTIVE IMPAIRMENTS: decreased activity tolerance, decreased balance, difficulty walking, decreased strength, and pain.   ACTIVITY LIMITATIONS: carrying, lifting, standing, stairs, and locomotion level  PARTICIPATION LIMITATIONS: meal prep, cleaning, shopping, and community activity  PERSONAL FACTORS: Age and 1  comorbidity: recent dx of breast cancer  are also affecting patient's functional outcome.   REHAB POTENTIAL: Good  CLINICAL DECISION MAKING: Stable/uncomplicated  EVALUATION COMPLEXITY: Moderate   GOALS: Goals reviewed with patient? No  SHORT TERM GOALS: Target date: 06/11/21   PT to be I in HEP in order to decrease pain to no greater than a 3/10 Baseline: Goal status: INITIAL  2.  PT core and LE strength to be increased 1/2 grade to allow pt to be able to get up from a low lying couch easier Baseline:  Goal status: INITIAL  3.  PT to be able to stand/walk for   30  minutes in order to complete meal prep.  Baseline:  Goal status: INITIAL  4.  Pt pain to go no further than her knee area to demonstrate decreased nerve irritation  Baseline:  Goal status: INITIAL  5.  Pt to understand the importance of using proper body mechanics for bed mobility and lifting  Baseline:  Goal status: INITIAL  LONG TERM GOALS: Target date: 07/01/22  PT to be I in HEP in order to decrease pain to no greater than a 1/10 Baseline:  Goal status: INITIAL  2.  PT core and LE strength to be increased 1 grade to allow pt to be able to get up and down her steps at home easier  Baseline:  Goal status: INITIAL  3.  PT to be able to stand/walk for 60  minutes in order to be able to go shopping with her family .  Baseline:  Goal status: INITIAL  4.  Pt pain to only radiate to the buttock area  Baseline:  Goal status: INITIAL  PLAN:  PT FREQUENCY: 2x/week  PT DURATION: 6 weeks  PLANNED INTERVENTIONS: Therapeutic exercises, Therapeutic activity, Neuromuscular re-education, Balance training, Patient/Family education, Self Care, and Manual therapy.  PLAN FOR NEXT SESSION: Progress core and LE strengthening exercises add active hamstring stretch.   Rayetta Humphrey, PT CLT [phone]  05/20/2022, 12:10 AM

## 2022-05-20 ENCOUNTER — Telehealth: Payer: Self-pay

## 2022-05-20 ENCOUNTER — Ambulatory Visit (HOSPITAL_COMMUNITY): Payer: PPO | Attending: Family Medicine | Admitting: Physical Therapy

## 2022-05-20 DIAGNOSIS — M5416 Radiculopathy, lumbar region: Secondary | ICD-10-CM | POA: Diagnosis not present

## 2022-05-20 DIAGNOSIS — M6281 Muscle weakness (generalized): Secondary | ICD-10-CM | POA: Insufficient documentation

## 2022-05-20 NOTE — Telephone Encounter (Addendum)
Called patient and relayed message below. Patient stated she done well with biopsy. Daughter has taken a picture and will be sending to Cira Rue NP to see a small bruise around the area just to see if that is normal.       ----- Message from Alla Feeling, NP sent at 05/19/2022  4:38 PM EST ----- Jenny Reichmann, could you please call pt to make sure she is aware breast biopsy is benign, no cancer, and see how she is doing after biopsy. Appears she has seen the report in [place].   Rebekah, glad it's negative! She will continue f/up with you if that's ok?  Thanks, Regan Rakers NP

## 2022-05-22 ENCOUNTER — Ambulatory Visit (HOSPITAL_COMMUNITY): Payer: PPO | Admitting: Physical Therapy

## 2022-05-22 DIAGNOSIS — M5416 Radiculopathy, lumbar region: Secondary | ICD-10-CM | POA: Diagnosis not present

## 2022-05-22 DIAGNOSIS — M6281 Muscle weakness (generalized): Secondary | ICD-10-CM

## 2022-05-22 NOTE — Therapy (Signed)
OUTPATIENT PHYSICAL THERAPY TREATMENT  Patient Name: Nancy Liu MRN: [id number] DOB:[birth year], 81 y.o., female Today's Date: 05/22/2022   END OF SESSION:   PT End of Session - 05/22/22 0835     Visit Number 2    Number of Visits 12    Date for PT Re-Evaluation 07/01/22    Authorization Type [employer]    PT Start Time 0820    PT Stop Time 0905    PT Time Calculation (min) 45 min                  Past Medical History:  Diagnosis Date   Arteriosclerotic cardiovascular disease (ASCVD) 2001   Bare-metal stent placed in the right coronary artery in 12/01; residual 50% lesion of the first diagonal and mid LAD   Breast cancer ([place])    Cerebrovascular disease    COPD (chronic obstructive pulmonary disease) ([place])    Cyst, dermoid, arm, left 03/26/2018   Diabetes mellitus    excellent control with a low-dose of a single oral agent   Family history of breast cancer 01/23/2021   Family history of ovarian cancer 01/23/2021   GERD (gastroesophageal reflux disease)    with ulcers   History of right coronary artery stent placement 2000   Hyperlipidemia    Hypertension    Sleep apnea    Thalassemia minor    Tobacco abuse, in remission    35 pack years; Quit in 1980   Past Surgical History:  Procedure Laterality Date   BIOPSY  02/28/2020   Procedure: BIOPSY;  Surgeon: Eloise Harman, DO;  [facility];  Service: Endoscopy;;   COLONOSCOPY  2006   Dr. Collene Mares: normal   COLONOSCOPY  2000   Dr. Everlean Cherry: normal    COLONOSCOPY N/A 03/03/2016   Dr. Oneida Alar: diverticulosis, non-bleeding hemorrhoids, redundant left colon.   DILATION AND CURETTAGE OF UTERUS     ESOPHAGOGASTRODUODENOSCOPY  2000   Dr. Everlean Cherry: gastritis    ESOPHAGOGASTRODUODENOSCOPY  2006   Dr. Collene Mares: small hiatal hernia, gastritis, negative H.pylori    ESOPHAGOGASTRODUODENOSCOPY N/A 03/03/2016   Procedure: ESOPHAGOGASTRODUODENOSCOPY (EGD);  Surgeon: Danie Binder, MD;  [facility];  Service: Endoscopy;  Laterality: N/A;   ESOPHAGOGASTRODUODENOSCOPY N/A 06/03/2016   Dr. Oneida Alar: nonaggressive gastritis due to aspirin use. Previous ulcers had healed.   ESOPHAGOGASTRODUODENOSCOPY (EGD) WITH PROPOFOL N/A 02/28/2020   focal inflammation in the gastric antrum consistent with gastritis on biopsies. No H.pylori.    EXCISION OF KELOID Left 03/26/2018   Procedure: EXCISION OF LEFT ARM MASS;  Surgeon: Fanny Skates, MD;  Location: [place];  Service: General;  Laterality: Left;   GIVENS CAPSULE STUDY N/A 10/10/2020   Procedure: GIVENS CAPSULE STUDY;  Surgeon: Eloise Harman, DO;  [facility];  Service: Endoscopy;  Laterality: N/A;  7:30am   INGUINAL HERNIA REPAIR  1970s   Right   TOTAL MASTECTOMY Right 02/20/2021   Procedure: RIGHT TOTAL MASTECTOMY;  Surgeon: Rolm Bookbinder, MD;  Location: [place];  Service: General;  Laterality: Right;   VAGINAL HYSTERECTOMY  1980   Unilateral oophorectomy   Patient Active Problem List   Diagnosis Date Noted   Snoring 04/18/2022   GERD (gastroesophageal reflux disease) 03/05/2021   S/P mastectomy, right 02/20/2021   Genetic testing 02/12/2021   Family history of breast cancer 01/23/2021   Family history of ovarian cancer 01/23/2021   Ductal carcinoma in situ (DCIS) of right breast 01/17/2021   Non-insulin  dependent type 2 diabetes mellitus ([place]) 03/26/2020   Loss of weight 07/13/2019   Constipation 02/10/2017   AVM (arteriovenous malformation) of small bowel, acquired 02/10/2017   Iron deficiency anemia due to chronic blood loss 07/23/2016   Weight loss 04/01/2012   Chest pain on exertion 02/07/2011   Cerebrovascular disease 01/31/2010   CAD S/P percutaneous coronary angioplasty 01/03/2009   Dyslipidemia, goal LDL below 70 10/22/2007   Essential hypertension 10/22/2007    PCP: Lucianne Lei   REFERRING PROVIDER: Lucianne Lei  REFERRING DIAG: PT eval/tx for LBP  Rationale for Evaluation and  Treatment: Rehabilitation  THERAPY DIAG:   LBP Lumbar radiculopathy Muscle weakness  ONSET DATE: 10/31/2021  SUBJECTIVE:                                                                                                                                                                                           SUBJECTIVE STATEMENT: Pt reports pain of 4/10 that starts in Rt hip around to Lt and down back of leg.   Evaluation: Pt states that she has been having back pain that goes down both her legs for about six months.  The pain will go down to her ankle.  She has recently been dx with Rt breast cancer. Pt states that she can stand for about 10-15 mintues.  She can walk for 10 to 15 minutes before she has to sit down.   PERTINENT HISTORY:  Hx of recently dx  breast cancer, DM, HTN  PAIN:  Are you having pain? Yes: NPRS scale: 0/10;  worst 5/10 Pain location: Low back into legs  Pain description: aching  Aggravating factors: wt bearing  Relieving factors: rest   PRECAUTIONS: recently dx breast cancer with recent surgery  no prone at this time   WEIGHT BEARING RESTRICTIONS: No  FALLS:  Has patient fallen in last 6 months? Yes. Number of falls 1  LIVING ENVIRONMENT: Lives with: lives with their family Lives in: House/apartment Stairs: Yes: Internal: 16 steps; on right going up Has following equipment at home: None  OCCUPATION: retired   PLOF: Independent  PATIENT GOALS: To be able to walk and stand longer, no pain in her legs.    NEXT MD VISIT:   OBJECTIVE:   PATIENT SURVEYS:  FOTO 93    COGNITION: Overall cognitive status: Within functional limits for tasks assessed     SENSATION: Not tested   PALPATION: Unremarkable   LUMBAR ROM:   AROM eval  Flexion Fingers to toes going down causes increased pain   Extension 25; reps increase pain.  Right lateral flexion   Left lateral flexion  Right rotation   Left rotation    (Blank rows = not tested)  LOWER  EXTREMITY MMT:    MMT Right eval Left eval  Hip flexion 5/5 3/5  Hip extension 2/5 2/5  Hip abduction 3+/5  3/5  Hip adduction    Hip internal rotation    Hip external rotation    Knee flexion 5/5 5/5  Knee extension 5/5 4/5  Ankle dorsiflexion 5/5 5/5  Ankle plantarflexion    Ankle inversion    Ankle eversion     (Blank rows = not tested)  FUNCTIONAL TESTS:  30 seconds chair stand test:  9 reps  2 minute walk test: 452 ft    TODAY'S TREATMENT:                                                                                                                              DATE:  05/22/22 Ambulation around gym 4 laps Seated: piriformis stretch 2X30" each  Sit to stands no UE 10X Standing 3D hip excursions 10X each direction Supine: ab set 10X5"  Bridge 10X  Knee to chest stretch 3X30"  SLR with ab set 10X each  Hamstring stretch active 2X30"  Piriformis stretch 2X30"   Evaluation   Supine: Knee to chest stretch 3 x 30" Abdominal set x 10 Bridge x 10    PATIENT EDUCATION:  Education details: HEP Person educated: Patient Education method: Consulting civil engineer, Verbal cues, and Handouts Education comprehension: verbalized understanding and returned demonstration  HOME EXERCISE PROGRAM: Access Code: [email or web address] URL: [email or web address] Date: 05/20/2022 Prepared by: Rayetta Humphrey  Exercises - Supine Transversus Abdominis Bracing - Hands on Stomach  - 2 x daily - 7 x weekly - 1 sets - 10 reps - 5" hold - Supine Bridge  - 2 x daily - 7 x weekly - 1 sets - 10 reps - 5-10" hold - Supine Single Knee to Chest Stretch  - 2 x daily - 7 x weekly - 1 sets - 3 reps - 30" hold  ASSESSMENT:  CLINICAL IMPRESSION: Goals reviewed and POC moving forward.  Patient unable to recall HEP but able to complete with minimal cues once demonstrated.  Tends to reduce hold times requiring cues to hold stretches longer.  Added hamstring and piriformis stretch with noted tightness in  both mm.  Addressed lumbar mobility with excursions and added sit to stands and SLR for LE strengthening.  Pt without any pain or issues with HEP or added therex this session.  No new exercises were added to HEP.  Pt will continue to benefit from skilled PT to address deficits and maximize her functional ability. .   OBJECTIVE IMPAIRMENTS: decreased activity tolerance, decreased balance, difficulty walking, decreased strength, and pain.   ACTIVITY LIMITATIONS: carrying, lifting, standing, stairs, and locomotion level  PARTICIPATION LIMITATIONS: meal prep, cleaning, shopping, and community activity  PERSONAL FACTORS: Age and 1 comorbidity: recent dx of breast cancer  are also affecting patient's functional outcome.  REHAB POTENTIAL: Good  CLINICAL DECISION MAKING: Stable/uncomplicated  EVALUATION COMPLEXITY: Moderate   GOALS: Goals reviewed with patient? Yes  SHORT TERM GOALS: Target date: 06/11/21   PT to be I in HEP in order to decrease pain to no greater than a 3/10 Baseline: Goal status: IN PROGRESS  2.  PT core and LE strength to be increased 1/2 grade to allow pt to be able to get up from a low lying couch easier Baseline:  Goal status: IN PROGRESS  3.  PT to be able to stand/walk for   30  minutes in order to complete meal prep.  Baseline:  Goal status: IN PROGRESS  4.  Pt pain to go no further than her knee area to demonstrate decreased nerve irritation  Baseline:  Goal status: IN PROGRESS  5.  Pt to understand the importance of using proper body mechanics for bed mobility and lifting  Baseline:  Goal status: IN PROGRESS  LONG TERM GOALS: Target date: 07/01/22  PT to be I in HEP in order to decrease pain to no greater than a 1/10 Baseline:  Goal status: IN PROGRESS  2.  PT core and LE strength to be increased 1 grade to allow pt to be able to get up and down her steps at home easier  Baseline:  Goal status: IN PROGRESS  3.  PT to be able to stand/walk for 60   minutes in order to be able to go shopping with her family .  Baseline:  Goal status: IN PROGRESS  4.  Pt pain to only radiate to the buttock area  Baseline:  Goal status: IN PROGRESS  PLAN:  PT FREQUENCY: 2x/week  PT DURATION: 6 weeks  PLANNED INTERVENTIONS: Therapeutic exercises, Therapeutic activity, Neuromuscular re-education, Balance training, Patient/Family education, Self Care, and Manual therapy.  PLAN FOR NEXT SESSION: Progress core and LE strengthening.  Add piriformis and hamstring stretches to HEP next session.   Teena Irani, PTA/CLT [place] Ph: [phone]  05/22/2022, 12:10 AM

## 2022-05-28 ENCOUNTER — Ambulatory Visit (HOSPITAL_COMMUNITY): Payer: PPO

## 2022-05-28 DIAGNOSIS — M5416 Radiculopathy, lumbar region: Secondary | ICD-10-CM

## 2022-05-28 DIAGNOSIS — M6281 Muscle weakness (generalized): Secondary | ICD-10-CM

## 2022-05-28 NOTE — Therapy (Addendum)
OUTPATIENT PHYSICAL THERAPY TREATMENT  Patient Name: Nancy Liu MRN: [id number] DOB:1940-09-22, 81 y.o., female Today's Date: 05/28/2022   END OF SESSION:    05/28/22 0901  PT Visits / Re-Eval  Visit Number 3  Number of Visits 12  Date for PT Re-Evaluation 07/01/22  Authorization  Authorization Type [employer]  PT Time Calculation  PT Start Time 0901  PT Stop Time 0539  PT Time Calculation (min) 43 min        Past Medical History:  Diagnosis Date   Arteriosclerotic cardiovascular disease (ASCVD) 2001   Bare-metal stent placed in the right coronary artery in 12/01; residual 50% lesion of the first diagonal and mid LAD   Breast cancer ([place])    Cerebrovascular disease    COPD (chronic obstructive pulmonary disease) ([facility])    Cyst, dermoid, arm, left 03/26/2018   Diabetes mellitus    excellent control with a low-dose of a single oral agent   Family history of breast cancer 01/23/2021   Family history of ovarian cancer 01/23/2021   GERD (gastroesophageal reflux disease)    with ulcers   History of right coronary artery stent placement 2000   Hyperlipidemia    Hypertension    Sleep apnea    Thalassemia minor    Tobacco abuse, in remission    35 pack years; Quit in 1980   Past Surgical History:  Procedure Laterality Date   BIOPSY  02/28/2020   Procedure: BIOPSY;  Surgeon: Eloise Harman, DO;  [facility];  Service: Endoscopy;;   COLONOSCOPY  2006   Dr. Collene Mares: normal   COLONOSCOPY  2000   Dr. Everlean Cherry: normal    COLONOSCOPY N/A 03/03/2016   Dr. Oneida Alar: diverticulosis, non-bleeding hemorrhoids, redundant left colon.   DILATION AND CURETTAGE OF UTERUS     ESOPHAGOGASTRODUODENOSCOPY  2000   Dr. Everlean Cherry: gastritis    ESOPHAGOGASTRODUODENOSCOPY  2006   Dr. Collene Mares: small hiatal hernia, gastritis, negative H.pylori    ESOPHAGOGASTRODUODENOSCOPY N/A 03/03/2016   Procedure: ESOPHAGOGASTRODUODENOSCOPY (EGD);  Surgeon: Danie Binder, MD;  [facility];  Service: Endoscopy;  Laterality: N/A;   ESOPHAGOGASTRODUODENOSCOPY N/A 06/03/2016   Dr. Oneida Alar: nonaggressive gastritis due to aspirin use. Previous ulcers had healed.   ESOPHAGOGASTRODUODENOSCOPY (EGD) WITH PROPOFOL N/A 02/28/2020   focal inflammation in the gastric antrum consistent with gastritis on biopsies. No H.pylori.    EXCISION OF KELOID Left 03/26/2018   Procedure: EXCISION OF LEFT ARM MASS;  Surgeon: Fanny Skates, MD;  Location: [place];  Service: General;  Laterality: Left;   GIVENS CAPSULE STUDY N/A 10/10/2020   Procedure: GIVENS CAPSULE STUDY;  Surgeon: Eloise Harman, DO;  [facility];  Service: Endoscopy;  Laterality: N/A;  7:30am   INGUINAL HERNIA REPAIR  1970s   Right   TOTAL MASTECTOMY Right 02/20/2021   Procedure: RIGHT TOTAL MASTECTOMY;  Surgeon: Rolm Bookbinder, MD;  Location: [place];  Service: General;  Laterality: Right;   VAGINAL HYSTERECTOMY  1980   Unilateral oophorectomy   Patient Active Problem List   Diagnosis Date Noted   Snoring 04/18/2022   GERD (gastroesophageal reflux disease) 03/05/2021   S/P mastectomy, right 02/20/2021   Genetic testing 02/12/2021   Family history of breast cancer 01/23/2021   Family history of ovarian cancer 01/23/2021   Ductal carcinoma in situ (DCIS) of right breast 01/17/2021   Non-insulin dependent type 2 diabetes mellitus ([place]) 03/26/2020   Loss of weight 07/13/2019   Constipation 02/10/2017  AVM (arteriovenous malformation) of small bowel, acquired 02/10/2017   Iron deficiency anemia due to chronic blood loss 07/23/2016   Weight loss 04/01/2012   Chest pain on exertion 02/07/2011   Cerebrovascular disease 01/31/2010   CAD S/P percutaneous coronary angioplasty 01/03/2009   Dyslipidemia, goal LDL below 70 10/22/2007   Essential hypertension 10/22/2007    PCP: Lucianne Lei   REFERRING PROVIDER: Lucianne Lei  REFERRING DIAG: PT eval/tx for LBP  Rationale for Evaluation  and Treatment: Rehabilitation  THERAPY DIAG:   LBP Lumbar radiculopathy Muscle weakness  ONSET DATE: 10/31/2021  SUBJECTIVE:                                                                                                                                                                                           SUBJECTIVE STATEMENT: Patient reports she is still hurting and sore; is trying to do her exercises.  Pain is still going down to there left ankle   Evaluation: Pt states that she has been having back pain that goes down both her legs for about six months.  The pain will go down to her ankle.  She has recently been dx with Rt breast cancer. Pt states that she can stand for about 10-15 mintues.  She can walk for 10 to 15 minutes before she has to sit down.   PERTINENT HISTORY:  Hx of recently dx  breast cancer, DM, HTN  PAIN:  Are you having pain? Yes: NPRS scale: 4/10 today;  worst 5/10 Pain location: Low back into legs  Pain description: aching  Aggravating factors: wt bearing  Relieving factors: rest   PRECAUTIONS: recently dx breast cancer with recent surgery  no prone at this time   WEIGHT BEARING RESTRICTIONS: No  FALLS:  Has patient fallen in last 6 months? Yes. Number of falls 1  LIVING ENVIRONMENT: Lives with: lives with their family Lives in: House/apartment Stairs: Yes: Internal: 16 steps; on right going up Has following equipment at home: None  OCCUPATION: retired   PLOF: Independent  PATIENT GOALS: To be able to walk and stand longer, no pain in her legs.    NEXT MD VISIT:   OBJECTIVE:   PATIENT SURVEYS:  FOTO 51    COGNITION: Overall cognitive status: Within functional limits for tasks assessed     SENSATION: Not tested   PALPATION: Unremarkable   LUMBAR ROM:   AROM eval  Flexion Fingers to toes going down causes increased pain   Extension 25; reps increase pain.  Right lateral flexion   Left lateral flexion   Right rotation    Left rotation    (Blank  rows = not tested)  LOWER EXTREMITY MMT:    MMT Right eval Left eval Right 05/28/22 Left 05/28/22  Hip flexion 5/5 3/5  4  Hip extension 2/5 2/5 3+ 3  Hip abduction 3+/5  3/5    Hip adduction      Hip internal rotation      Hip external rotation      Knee flexion 5/5 5/5    Knee extension 5/5 4/5    Ankle dorsiflexion 5/5 5/5    Ankle plantarflexion      Ankle inversion      Ankle eversion       (Blank rows = not tested)  FUNCTIONAL TESTS:  30 seconds chair stand test:  9 reps  2 minute walk test: 452 ft    TODAY'S TREATMENT:                                                                                                                              DATE:  05/28/22 Prone lying x 1' Prone on elbows x 1'  Supine: Abdominal bracing 5" hold x 10 Abdominal bracing with bridge x 10 Hip adduction with ball and bridge x 5 Hip abduction with belt and bridge x 10   05/22/22 Ambulation around gym 4 laps Seated: piriformis stretch 2X30" each  Sit to stands no UE 10X Standing 3D hip excursions 10X each direction Supine: ab set 10X5"  Bridge 10X  Knee to chest stretch 3X30"  SLR with ab set 10X each  Hamstring stretch active 2X30"  Piriformis stretch 2X30"   Evaluation   Supine: Knee to chest stretch 3 x 30" Abdominal set x 10 Bridge x 10    PATIENT EDUCATION:  Education details: HEP Person educated: Patient Education method: Consulting civil engineer, Verbal cues, and Handouts Education comprehension: verbalized understanding and returned demonstration  HOME EXERCISE PROGRAM: 05/28/22 prone on elbows, prone press ups, bridge with ball, bridge with belt Access Code: [email or web address] URL: [email or web address] Date: 05/20/2022 Prepared by: Rayetta Humphrey  Exercises - Supine Transversus Abdominis Bracing - Hands on Stomach  - 2 x daily - 7 x weekly - 1 sets - 10 reps - 5" hold - Supine Bridge  - 2 x daily - 7 x weekly - 1 sets - 10 reps -  5-10" hold - Supine Single Knee to Chest Stretch  - 2 x daily - 7 x weekly - 1 sets - 3 reps - 30" hold  ASSESSMENT:  CLINICAL IMPRESSION: Today's session began with trial of prone on elbows and prone press ups; also progressed bridge exercise without issue and updated HEP; improved strength testing noted today and decreased pain to 2/10 at the end of treatment today.  Patient will benefit from continued skilled therapy services to address deficits and promote return to optimal function.      OBJECTIVE IMPAIRMENTS: decreased activity tolerance, decreased balance, difficulty walking, decreased strength, and pain.   ACTIVITY LIMITATIONS: carrying, lifting, standing, stairs, and locomotion  level  PARTICIPATION LIMITATIONS: meal prep, cleaning, shopping, and community activity  PERSONAL FACTORS: Age and 1 comorbidity: recent dx of breast cancer  are also affecting patient's functional outcome.   REHAB POTENTIAL: Good  CLINICAL DECISION MAKING: Stable/uncomplicated  EVALUATION COMPLEXITY: Moderate   GOALS: Goals reviewed with patient? Yes  SHORT TERM GOALS: Target date: 06/11/21   PT to be I in HEP in order to decrease pain to no greater than a 3/10 Baseline: Goal status: IN PROGRESS  2.  PT core and LE strength to be increased 1/2 grade to allow pt to be able to get up from a low lying couch easier Baseline:  Goal status: IN PROGRESS  3.  PT to be able to stand/walk for   30  minutes in order to complete meal prep.  Baseline:  Goal status: IN PROGRESS  4.  Pt pain to go no further than her knee area to demonstrate decreased nerve irritation  Baseline:  Goal status: IN PROGRESS  5.  Pt to understand the importance of using proper body mechanics for bed mobility and lifting  Baseline:  Goal status: IN PROGRESS  LONG TERM GOALS: Target date: 07/01/22  PT to be I in HEP in order to decrease pain to no greater than a 1/10 Baseline:  Goal status: IN PROGRESS  2.  PT core  and LE strength to be increased 1 grade to allow pt to be able to get up and down her steps at home easier  Baseline:  Goal status: IN PROGRESS  3.  PT to be able to stand/walk for 60  minutes in order to be able to go shopping with her family .  Baseline:  Goal status: IN PROGRESS  4.  Pt pain to only radiate to the buttock area  Baseline:  Goal status: IN PROGRESS  PLAN:  PT FREQUENCY: 2x/week  PT DURATION: 6 weeks  PLANNED INTERVENTIONS: Therapeutic exercises, Therapeutic activity, Neuromuscular re-education, Balance training, Patient/Family education, Self Care, and Manual therapy.  PLAN FOR NEXT SESSION: Progress core and LE strengthening.  Add piriformis and hamstring stretches to HEP next session.   9:45 AM, 05/28/22 Ata Pecha Small Felissa Blouch MPT [facility]  [phone]

## 2022-06-03 ENCOUNTER — Ambulatory Visit (HOSPITAL_COMMUNITY): Payer: PPO | Attending: Family Medicine

## 2022-06-03 DIAGNOSIS — M5416 Radiculopathy, lumbar region: Secondary | ICD-10-CM | POA: Insufficient documentation

## 2022-06-03 DIAGNOSIS — R293 Abnormal posture: Secondary | ICD-10-CM | POA: Diagnosis not present

## 2022-06-03 DIAGNOSIS — D0511 Intraductal carcinoma in situ of right breast: Secondary | ICD-10-CM | POA: Diagnosis not present

## 2022-06-03 DIAGNOSIS — R600 Localized edema: Secondary | ICD-10-CM | POA: Diagnosis not present

## 2022-06-03 DIAGNOSIS — M6281 Muscle weakness (generalized): Secondary | ICD-10-CM | POA: Insufficient documentation

## 2022-06-03 NOTE — Therapy (Signed)
OUTPATIENT PHYSICAL THERAPY TREATMENT  Patient Name: Nancy Liu MRN: [id number] DOB:06/14/1940, 82 y.o., female Today's Date: 06/03/2022   END OF SESSION:     06/03/22 0907  PT Visits / Re-Eval  Visit Number 4  Number of Visits 12  Date for PT Re-Evaluation 07/01/22  Authorization  Authorization Type [employer]  PT Time Calculation  PT Start Time 0905  PT Stop Time 0944  PT Time Calculation (min) 39 min         Past Medical History:  Diagnosis Date   Arteriosclerotic cardiovascular disease (ASCVD) 2001   Bare-metal stent placed in the right coronary artery in 12/01; residual 50% lesion of the first diagonal and mid LAD   Breast cancer ([place])    Cerebrovascular disease    COPD (chronic obstructive pulmonary disease) (HCC)    Cyst, dermoid, arm, left 03/26/2018   Diabetes mellitus    excellent control with a low-dose of a single oral agent   Family history of breast cancer 01/23/2021   Family history of ovarian cancer 01/23/2021   GERD (gastroesophageal reflux disease)    with ulcers   History of right coronary artery stent placement 2000   Hyperlipidemia    Hypertension    Sleep apnea    Thalassemia minor    Tobacco abuse, in remission    35 pack years; Quit in 1980   Past Surgical History:  Procedure Laterality Date   BIOPSY  02/28/2020   Procedure: BIOPSY;  Surgeon: Eloise Harman, DO;  [facility];  Service: Endoscopy;;   COLONOSCOPY  2006   Dr. Collene Mares: normal   COLONOSCOPY  2000   Dr. Everlean Cherry: normal    COLONOSCOPY N/A 03/03/2016   Dr. Oneida Alar: diverticulosis, non-bleeding hemorrhoids, redundant left colon.   DILATION AND CURETTAGE OF UTERUS     ESOPHAGOGASTRODUODENOSCOPY  2000   Dr. Everlean Cherry: gastritis    ESOPHAGOGASTRODUODENOSCOPY  2006   Dr. Collene Mares: small hiatal hernia, gastritis, negative H.pylori    ESOPHAGOGASTRODUODENOSCOPY N/A 03/03/2016   Procedure: ESOPHAGOGASTRODUODENOSCOPY (EGD);  Surgeon: Danie Binder, MD;  [facility];  Service: Endoscopy;  Laterality: N/A;   ESOPHAGOGASTRODUODENOSCOPY N/A 06/03/2016   Dr. Oneida Alar: nonaggressive gastritis due to aspirin use. Previous ulcers had healed.   ESOPHAGOGASTRODUODENOSCOPY (EGD) WITH PROPOFOL N/A 02/28/2020   focal inflammation in the gastric antrum consistent with gastritis on biopsies. No H.pylori.    EXCISION OF KELOID Left 03/26/2018   Procedure: EXCISION OF LEFT ARM MASS;  Surgeon: Fanny Skates, MD;  Location: [place];  Service: General;  Laterality: Left;   GIVENS CAPSULE STUDY N/A 10/10/2020   Procedure: GIVENS CAPSULE STUDY;  Surgeon: Eloise Harman, DO;  [facility];  Service: Endoscopy;  Laterality: N/A;  7:30am   INGUINAL HERNIA REPAIR  1970s   Right   TOTAL MASTECTOMY Right 02/20/2021   Procedure: RIGHT TOTAL MASTECTOMY;  Surgeon: Rolm Bookbinder, MD;  Location: [place];  Service: General;  Laterality: Right;   VAGINAL HYSTERECTOMY  1980   Unilateral oophorectomy   Patient Active Problem List   Diagnosis Date Noted   Snoring 04/18/2022   GERD (gastroesophageal reflux disease) 03/05/2021   S/P mastectomy, right 02/20/2021   Genetic testing 02/12/2021   Family history of breast cancer 01/23/2021   Family history of ovarian cancer 01/23/2021   Ductal carcinoma in situ (DCIS) of right breast 01/17/2021   Non-insulin dependent type 2 diabetes mellitus ([place]) 03/26/2020   Loss of weight 07/13/2019   Constipation  02/10/2017   AVM (arteriovenous malformation) of small bowel, acquired 02/10/2017   Iron deficiency anemia due to chronic blood loss 07/23/2016   Weight loss 04/01/2012   Chest pain on exertion 02/07/2011   Cerebrovascular disease 01/31/2010   CAD S/P percutaneous coronary angioplasty 01/03/2009   Dyslipidemia, goal LDL below 70 10/22/2007   Essential hypertension 10/22/2007    PCP: Lucianne Lei   REFERRING PROVIDER: Lucianne Lei  REFERRING DIAG: PT eval/tx for LBP  Rationale for Evaluation  and Treatment: Rehabilitation  THERAPY DIAG:   LBP Lumbar radiculopathy Muscle weakness  ONSET DATE: 10/31/2021  SUBJECTIVE:                                                                                                                                                                                           SUBJECTIVE STATEMENT: Sciatica acted up over the weekend; all the way down to left   Evaluation: Pt states that she has been having back pain that goes down both her legs for about six months.  The pain will go down to her ankle.  She has recently been dx with Rt breast cancer. Pt states that she can stand for about 10-15 mintues.  She can walk for 10 to 15 minutes before she has to sit down.   PERTINENT HISTORY:  Hx of recently dx  breast cancer, DM, HTN  PAIN:  Are you having pain? Yes: NPRS scale: 4-5/10 today;  worst 5/10 Pain location: Low back into legs  Pain description: aching  Aggravating factors: wt bearing  Relieving factors: rest   PRECAUTIONS: recently dx breast cancer with recent surgery  no prone at this time   WEIGHT BEARING RESTRICTIONS: No  FALLS:  Has patient fallen in last 6 months? Yes. Number of falls 1  LIVING ENVIRONMENT: Lives with: lives with their family Lives in: House/apartment Stairs: Yes: Internal: 16 steps; on right going up Has following equipment at home: None  OCCUPATION: retired   PLOF: Independent  PATIENT GOALS: To be able to walk and stand longer, no pain in her legs.    NEXT MD VISIT:   OBJECTIVE:   PATIENT SURVEYS:  FOTO 91    COGNITION: Overall cognitive status: Within functional limits for tasks assessed     SENSATION: Not tested   PALPATION: Unremarkable   LUMBAR ROM:   AROM eval  Flexion Fingers to toes going down causes increased pain   Extension 25; reps increase pain.  Right lateral flexion   Left lateral flexion   Right rotation   Left rotation    (Blank rows = not tested)  LOWER EXTREMITY  MMT:  MMT Right eval Left eval Right 05/28/22 Left 05/28/22  Hip flexion 5/5 3/5  4  Hip extension 2/5 2/5 3+ 3  Hip abduction 3+/5  3/5    Hip adduction      Hip internal rotation      Hip external rotation      Knee flexion 5/5 5/5    Knee extension 5/5 4/5    Ankle dorsiflexion 5/5 5/5    Ankle plantarflexion      Ankle inversion      Ankle eversion       (Blank rows = not tested)  FUNCTIONAL TESTS:  30 seconds chair stand test:  9 reps  2 minute walk test: 452 ft    TODAY'S TREATMENT:                                                                                                                              DATE:  06/03/22 Prone: Prone lying x 1' ( pain down to 2 and down to left posterior thigh) Prone on elbows x 1 Prone press ups 2 x 5  Supine:   05/28/22 Prone lying x 1' Prone on elbows x 1'  Supine: Abdominal bracing 5" hold x 10 Abdominal bracing with bridge x 10 Hip adduction with ball and bridge x 5 Hip abduction with belt and bridge x 10   05/22/22 Ambulation around gym 4 laps Seated: piriformis stretch 2X30" each  Sit to stands no UE 10X Standing 3D hip excursions 10X each direction Supine: ab set 10X5"  Bridge 10X  Knee to chest stretch 3X30"  SLR with ab set 10X each  Hamstring stretch active 2X30"  Piriformis stretch 2X30"   Evaluation   Supine: Knee to chest stretch 3 x 30" Abdominal set x 10 Bridge x 10    PATIENT EDUCATION:  Education details: HEP Person educated: Patient Education method: Consulting civil engineer, Verbal cues, and Handouts Education comprehension: verbalized understanding and returned demonstration  HOME EXERCISE PROGRAM: 06/03/2022 seated scapular retractions, standing lumbar extensions 05/28/22 prone on elbows, prone press ups, bridge with ball, bridge with belt Access Code: [email or web address] URL: [email or web address] Date: 05/20/2022 Prepared by: Rayetta Humphrey  Exercises - Supine Transversus  Abdominis Bracing - Hands on Stomach  - 2 x daily - 7 x weekly - 1 sets - 10 reps - 5" hold - Supine Bridge  - 2 x daily - 7 x weekly - 1 sets - 10 reps - 5-10" hold - Supine Single Knee to Chest Stretch  - 2 x daily - 7 x weekly - 1 sets - 3 reps - 30" hold  ASSESSMENT:  CLINICAL IMPRESSION: Patient's pain centralizes with prone exercises.  Encouraged her to start with those with her HEP. Added seated scapular retractions and standing lumbar extensions to HEP without issue.  She needs cues for good posturing in sitting. Overall working hard in therapy.  States having some issue with anxiety and depression with COVD and recent cancer  diagnosis. Patient will benefit from continued skilled therapy services to address deficits and promote return to optimal function.      OBJECTIVE IMPAIRMENTS: decreased activity tolerance, decreased balance, difficulty walking, decreased strength, and pain.   ACTIVITY LIMITATIONS: carrying, lifting, standing, stairs, and locomotion level  PARTICIPATION LIMITATIONS: meal prep, cleaning, shopping, and community activity  PERSONAL FACTORS: Age and 1 comorbidity: recent dx of breast cancer  are also affecting patient's functional outcome.   REHAB POTENTIAL: Good  CLINICAL DECISION MAKING: Stable/uncomplicated  EVALUATION COMPLEXITY: Moderate   GOALS: Goals reviewed with patient? Yes  SHORT TERM GOALS: Target date: 06/11/21   PT to be I in HEP in order to decrease pain to no greater than a 3/10 Baseline: Goal status: IN PROGRESS  2.  PT core and LE strength to be increased 1/2 grade to allow pt to be able to get up from a low lying couch easier Baseline:  Goal status: IN PROGRESS  3.  PT to be able to stand/walk for   30  minutes in order to complete meal prep.  Baseline:  Goal status: IN PROGRESS  4.  Pt pain to go no further than her knee area to demonstrate decreased nerve irritation  Baseline:  Goal status: IN PROGRESS  5.  Pt to understand  the importance of using proper body mechanics for bed mobility and lifting  Baseline:  Goal status: IN PROGRESS  LONG TERM GOALS: Target date: 07/01/22  PT to be I in HEP in order to decrease pain to no greater than a 1/10 Baseline:  Goal status: IN PROGRESS  2.  PT core and LE strength to be increased 1 grade to allow pt to be able to get up and down her steps at home easier  Baseline:  Goal status: IN PROGRESS  3.  PT to be able to stand/walk for 60  minutes in order to be able to go shopping with her family .  Baseline:  Goal status: IN PROGRESS  4.  Pt pain to only radiate to the buttock area  Baseline:  Goal status: IN PROGRESS  PLAN:  PT FREQUENCY: 2x/week  PT DURATION: 6 weeks  PLANNED INTERVENTIONS: Therapeutic exercises, Therapeutic activity, Neuromuscular re-education, Balance training, Patient/Family education, Self Care, and Manual therapy.  PLAN FOR NEXT SESSION: Progress core and LE strengthening.  Add piriformis and hamstring stretches to HEP next session.   9:44 AM, 06/03/22 Terrell Ostrand Small Rhina Kramme MPT [facility] [place] [phone]

## 2022-06-05 ENCOUNTER — Ambulatory Visit (HOSPITAL_COMMUNITY): Payer: PPO | Admitting: Physical Therapy

## 2022-06-05 DIAGNOSIS — M5416 Radiculopathy, lumbar region: Secondary | ICD-10-CM | POA: Diagnosis not present

## 2022-06-05 DIAGNOSIS — M6281 Muscle weakness (generalized): Secondary | ICD-10-CM

## 2022-06-05 NOTE — Therapy (Signed)
OUTPATIENT PHYSICAL THERAPY TREATMENT  Patient Name: Nancy Liu MRN: [id number] DOB:Aug 16, 1940, 82 y.o., female Today's Date: 06/05/2022   END OF SESSION:   PT End of Session - 06/05/22 0973     Visit Number 5    Number of Visits 12    Date for PT Re-Evaluation 07/01/22    Authorization Type [employer]    PT Start Time 0815    PT Stop Time 5329    PT Time Calculation (min) 40 min                  Past Medical History:  Diagnosis Date   Arteriosclerotic cardiovascular disease (ASCVD) 2001   Bare-metal stent placed in the right coronary artery in 12/01; residual 50% lesion of the first diagonal and mid LAD   Breast cancer ([place])    Cerebrovascular disease    COPD (chronic obstructive pulmonary disease) ([place])    Cyst, dermoid, arm, left 03/26/2018   Diabetes mellitus    excellent control with a low-dose of a single oral agent   Family history of breast cancer 01/23/2021   Family history of ovarian cancer 01/23/2021   GERD (gastroesophageal reflux disease)    with ulcers   History of right coronary artery stent placement 2000   Hyperlipidemia    Hypertension    Sleep apnea    Thalassemia minor    Tobacco abuse, in remission    35 pack years; Quit in 1980   Past Surgical History:  Procedure Laterality Date   BIOPSY  02/28/2020   Procedure: BIOPSY;  Surgeon: Eloise Harman, DO;  [facility];  Service: Endoscopy;;   COLONOSCOPY  2006   Dr. Collene Mares: normal   COLONOSCOPY  2000   Dr. Everlean Cherry: normal    COLONOSCOPY N/A 03/03/2016   Dr. Oneida Alar: diverticulosis, non-bleeding hemorrhoids, redundant left colon.   DILATION AND CURETTAGE OF UTERUS     ESOPHAGOGASTRODUODENOSCOPY  2000   Dr. Everlean Cherry: gastritis    ESOPHAGOGASTRODUODENOSCOPY  2006   Dr. Collene Mares: small hiatal hernia, gastritis, negative H.pylori    ESOPHAGOGASTRODUODENOSCOPY N/A 03/03/2016   Procedure: ESOPHAGOGASTRODUODENOSCOPY (EGD);  Surgeon: Danie Binder, MD;  [facility];  Service: Endoscopy;  Laterality: N/A;   ESOPHAGOGASTRODUODENOSCOPY N/A 06/03/2016   Dr. Oneida Alar: nonaggressive gastritis due to aspirin use. Previous ulcers had healed.   ESOPHAGOGASTRODUODENOSCOPY (EGD) WITH PROPOFOL N/A 02/28/2020   focal inflammation in the gastric antrum consistent with gastritis on biopsies. No H.pylori.    EXCISION OF KELOID Left 03/26/2018   Procedure: EXCISION OF LEFT ARM MASS;  Surgeon: Fanny Skates, MD;  Location: [facility];  Service: General;  Laterality: Left;   GIVENS CAPSULE STUDY N/A 10/10/2020   Procedure: GIVENS CAPSULE STUDY;  Surgeon: Eloise Harman, DO;  [facility];  Service: Endoscopy;  Laterality: N/A;  7:30am   INGUINAL HERNIA REPAIR  1970s   Right   TOTAL MASTECTOMY Right 02/20/2021   Procedure: RIGHT TOTAL MASTECTOMY;  Surgeon: Rolm Bookbinder, MD;  Location: [facility];  Service: General;  Laterality: Right;   VAGINAL HYSTERECTOMY  1980   Unilateral oophorectomy   Patient Active Problem List   Diagnosis Date Noted   Snoring 04/18/2022   GERD (gastroesophageal reflux disease) 03/05/2021   S/P mastectomy, right 02/20/2021   Genetic testing 02/12/2021   Family history of breast cancer 01/23/2021   Family history of ovarian cancer 01/23/2021   Ductal carcinoma in situ (DCIS) of right breast 01/17/2021   Non-insulin  dependent type 2 diabetes mellitus ([place]) 03/26/2020   Loss of weight 07/13/2019   Constipation 02/10/2017   AVM (arteriovenous malformation) of small bowel, acquired 02/10/2017   Iron deficiency anemia due to chronic blood loss 07/23/2016   Weight loss 04/01/2012   Chest pain on exertion 02/07/2011   Cerebrovascular disease 01/31/2010   CAD S/P percutaneous coronary angioplasty 01/03/2009   Dyslipidemia, goal LDL below 70 10/22/2007   Essential hypertension 10/22/2007    PCP: Lucianne Lei   REFERRING PROVIDER: Lucianne Lei  REFERRING DIAG: PT eval/tx for LBP  Rationale for Evaluation and  Treatment: Rehabilitation  THERAPY DIAG:   LBP Lumbar radiculopathy Muscle weakness  ONSET DATE: 10/31/2021  SUBJECTIVE:                                                                                                                                                                                           SUBJECTIVE STATEMENT: PT states currently 2/10 because she hasn't been moving much.  States it goes as high as 5-6/10 and comes across her right side down into her Lt LE.   Evaluation: Pt states that she has been having back pain that goes down both her legs for about six months.  The pain will go down to her ankle.  She has recently been dx with Rt breast cancer. Pt states that she can stand for about 10-15 mintues.  She can walk for 10 to 15 minutes before she has to sit down.   PERTINENT HISTORY:  Hx of recently dx  breast cancer, DM, HTN  PAIN:  Are you having pain? Yes: NPRS scale: 2/10 today;  worst 5/10 Pain location: Low back into legs  Pain description: aching  Aggravating factors: wt bearing  Relieving factors: rest   PRECAUTIONS: recently dx breast cancer with recent surgery  no prone at this time   WEIGHT BEARING RESTRICTIONS: No  FALLS:  Has patient fallen in last 6 months? Yes. Number of falls 1  LIVING ENVIRONMENT: Lives with: lives with their family Lives in: House/apartment Stairs: Yes: Internal: 16 steps; on right going up Has following equipment at home: None  OCCUPATION: retired   PLOF: Independent  PATIENT GOALS: To be able to walk and stand longer, no pain in her legs.    NEXT MD VISIT:   OBJECTIVE:   PATIENT SURVEYS:  FOTO 11    COGNITION: Overall cognitive status: Within functional limits for tasks assessed     SENSATION: Not tested   PALPATION: Unremarkable   LUMBAR ROM:   AROM eval  Flexion Fingers to toes going down causes increased pain   Extension 25; reps  increase pain.  Right lateral flexion   Left lateral flexion    Right rotation   Left rotation    (Blank rows = not tested)  LOWER EXTREMITY MMT:    MMT Right eval Left eval Right 05/28/22 Left 05/28/22  Hip flexion 5/5 3/5  4  Hip extension 2/5 2/5 3+ 3  Hip abduction 3+/5  3/5    Hip adduction      Hip internal rotation      Hip external rotation      Knee flexion 5/5 5/5    Knee extension 5/5 4/5    Ankle dorsiflexion 5/5 5/5    Ankle plantarflexion      Ankle inversion      Ankle eversion       (Blank rows = not tested)  FUNCTIONAL TESTS:  30 seconds chair stand test:  9 reps  2 minute walk test: 452 ft    TODAY'S TREATMENT:                                                                                                                              DATE:  06/05/21 Prone:  lying 1 minute  POE 1'  Press ups 2x5 reps   Heelsqueezes 10X5" Sidelying: clams with stabilization 10X5" holds each LE Supine: Bridge 10X5"  SLR 10X each  Hamstring stretch 2X30" ea with towel Seated piriformis stretch 2X30" each  06/03/22 Prone: Prone lying x 1' ( pain down to 2 and down to left posterior thigh) Prone on elbows x 1 Prone press ups 2 x 5  Supine:   05/28/22 Prone lying x 1' Prone on elbows x 1'  Supine: Abdominal bracing 5" hold x 10 Abdominal bracing with bridge x 10 Hip adduction with ball and bridge x 5 Hip abduction with belt and bridge x 10   05/22/22 Ambulation around gym 4 laps Seated: piriformis stretch 2X30" each  Sit to stands no UE 10X Standing 3D hip excursions 10X each direction Supine: ab set 10X5"  Bridge 10X  Knee to chest stretch 3X30"  SLR with ab set 10X each  Hamstring stretch active 2X30"  Piriformis stretch 2X30"   Evaluation   Supine: Knee to chest stretch 3 x 30" Abdominal set x 10 Bridge x 10    PATIENT EDUCATION:  Education details: HEP Person educated: Patient Education method: Consulting civil engineer, Verbal cues, and Handouts Education comprehension: verbalized understanding and returned  demonstration  HOME EXERCISE PROGRAM: 06/03/2022 seated scapular retractions, standing lumbar extensions 05/28/22 prone on elbows, prone press ups, bridge with ball, bridge with belt Access Code: [email or web address] URL: [email or web address] Date: 05/20/2022 Prepared by: Rayetta Humphrey  Exercises - Supine Transversus Abdominis Bracing - Hands on Stomach  - 2 x daily - 7 x weekly - 1 sets - 10 reps - 5" hold - Supine Bridge  - 2 x daily - 7 x weekly - 1 sets - 10 reps - 5-10" hold - Supine Single Knee to  Chest Stretch  - 2 x daily - 7 x weekly - 1 sets - 3 reps - 30" hold  ASSESSMENT:  CLINICAL IMPRESSION: Continued with focus on reducing symptoms and improving core and LE strength.  Pt with some difficulty recalling exercises requiring demonstration from therapist. Unsure as to how many of her HEP she is completing regularly.  Resumed seated piriformis stretch with good results as well as supine hamstring stretch.  Noted tightness with both of these. Cues for good posturing in sitting needed as tends to slump. Pt will benefit from continued skilled therapy services to address deficits and promote return to optimal function.      OBJECTIVE IMPAIRMENTS: decreased activity tolerance, decreased balance, difficulty walking, decreased strength, and pain.   ACTIVITY LIMITATIONS: carrying, lifting, standing, stairs, and locomotion level  PARTICIPATION LIMITATIONS: meal prep, cleaning, shopping, and community activity  PERSONAL FACTORS: Age and 1 comorbidity: recent dx of breast cancer  are also affecting patient's functional outcome.   REHAB POTENTIAL: Good  CLINICAL DECISION MAKING: Stable/uncomplicated  EVALUATION COMPLEXITY: Moderate   GOALS: Goals reviewed with patient? Yes  SHORT TERM GOALS: Target date: 06/11/21   PT to be I in HEP in order to decrease pain to no greater than a 3/10 Baseline: Goal status: IN PROGRESS  2.  PT core and LE strength to be increased 1/2  grade to allow pt to be able to get up from a low lying couch easier Baseline:  Goal status: IN PROGRESS  3.  PT to be able to stand/walk for   30  minutes in order to complete meal prep.  Baseline:  Goal status: IN PROGRESS  4.  Pt pain to go no further than her knee area to demonstrate decreased nerve irritation  Baseline:  Goal status: IN PROGRESS  5.  Pt to understand the importance of using proper body mechanics for bed mobility and lifting  Baseline:  Goal status: IN PROGRESS  LONG TERM GOALS: Target date: 07/01/22  PT to be I in HEP in order to decrease pain to no greater than a 1/10 Baseline:  Goal status: IN PROGRESS  2.  PT core and LE strength to be increased 1 grade to allow pt to be able to get up and down her steps at home easier  Baseline:  Goal status: IN PROGRESS  3.  PT to be able to stand/walk for 60  minutes in order to be able to go shopping with her family .  Baseline:  Goal status: IN PROGRESS  4.  Pt pain to only radiate to the buttock area  Baseline:  Goal status: IN PROGRESS  PLAN:  PT FREQUENCY: 2x/week  PT DURATION: 6 weeks  PLANNED INTERVENTIONS: Therapeutic exercises, Therapeutic activity, Neuromuscular re-education, Balance training, Patient/Family education, Self Care, and Manual therapy.  PLAN FOR NEXT SESSION: Progress core and LE strengthening.  Add piriformis and hamstring stretches to HEP next session.   8:23 AM, 06/05/22 Teena Irani, PTA/CLT [place] Ph: [phone]

## 2022-06-10 ENCOUNTER — Encounter (HOSPITAL_COMMUNITY): Payer: PPO | Admitting: Physical Therapy

## 2022-06-10 DIAGNOSIS — J219 Acute bronchiolitis, unspecified: Secondary | ICD-10-CM | POA: Diagnosis not present

## 2022-06-10 DIAGNOSIS — E1169 Type 2 diabetes mellitus with other specified complication: Secondary | ICD-10-CM | POA: Diagnosis not present

## 2022-06-10 DIAGNOSIS — F32A Depression, unspecified: Secondary | ICD-10-CM | POA: Diagnosis not present

## 2022-06-12 ENCOUNTER — Encounter (HOSPITAL_COMMUNITY): Payer: PPO | Admitting: Physical Therapy

## 2022-06-17 ENCOUNTER — Ambulatory Visit (HOSPITAL_COMMUNITY): Payer: PPO

## 2022-06-17 DIAGNOSIS — M5416 Radiculopathy, lumbar region: Secondary | ICD-10-CM | POA: Diagnosis not present

## 2022-06-17 DIAGNOSIS — M6281 Muscle weakness (generalized): Secondary | ICD-10-CM

## 2022-06-17 NOTE — Therapy (Signed)
OUTPATIENT PHYSICAL THERAPY TREATMENT  Patient Name: Nancy Liu MRN: [id number] DOB:06/14/1940, 82 y.o., female Today's Date: 06/17/2022   END OF SESSION:   PT End of Session - 06/17/22 1032     Visit Number 6    Number of Visits 12    Date for PT Re-Evaluation 07/01/22    Authorization Type [employer]    PT Start Time 1031    PT Stop Time 1112    PT Time Calculation (min) 41 min                   Past Medical History:  Diagnosis Date   Arteriosclerotic cardiovascular disease (ASCVD) 2001   Bare-metal stent placed in the right coronary artery in 12/01; residual 50% lesion of the first diagonal and mid LAD   Breast cancer ([place])    Cerebrovascular disease    COPD (chronic obstructive pulmonary disease) ([place])    Cyst, dermoid, arm, left 03/26/2018   Diabetes mellitus    excellent control with a low-dose of a single oral agent   Family history of breast cancer 01/23/2021   Family history of ovarian cancer 01/23/2021   GERD (gastroesophageal reflux disease)    with ulcers   History of right coronary artery stent placement 2000   Hyperlipidemia    Hypertension    Sleep apnea    Thalassemia minor    Tobacco abuse, in remission    35 pack years; Quit in 1980   Past Surgical History:  Procedure Laterality Date   BIOPSY  02/28/2020   Procedure: BIOPSY;  Surgeon: Eloise Harman, DO;  [facility];  Service: Endoscopy;;   COLONOSCOPY  2006   Dr. Collene Mares: normal   COLONOSCOPY  2000   Dr. Everlean Cherry: normal    COLONOSCOPY N/A 03/03/2016   Dr. Oneida Alar: diverticulosis, non-bleeding hemorrhoids, redundant left colon.   DILATION AND CURETTAGE OF UTERUS     ESOPHAGOGASTRODUODENOSCOPY  2000   Dr. Everlean Cherry: gastritis    ESOPHAGOGASTRODUODENOSCOPY  2006   Dr. Collene Mares: small hiatal hernia, gastritis, negative H.pylori    ESOPHAGOGASTRODUODENOSCOPY N/A 03/03/2016   Procedure: ESOPHAGOGASTRODUODENOSCOPY (EGD);  Surgeon: Danie Binder, MD;  [facility];  Service: Endoscopy;  Laterality: N/A;   ESOPHAGOGASTRODUODENOSCOPY N/A 06/03/2016   Dr. Oneida Alar: nonaggressive gastritis due to aspirin use. Previous ulcers had healed.   ESOPHAGOGASTRODUODENOSCOPY (EGD) WITH PROPOFOL N/A 02/28/2020   focal inflammation in the gastric antrum consistent with gastritis on biopsies. No H.pylori.    EXCISION OF KELOID Left 03/26/2018   Procedure: EXCISION OF LEFT ARM MASS;  Surgeon: Fanny Skates, MD;  Location: [place];  Service: General;  Laterality: Left;   GIVENS CAPSULE STUDY N/A 10/10/2020   Procedure: GIVENS CAPSULE STUDY;  Surgeon: Eloise Harman, DO;  [facility];  Service: Endoscopy;  Laterality: N/A;  7:30am   INGUINAL HERNIA REPAIR  1970s   Right   TOTAL MASTECTOMY Right 02/20/2021   Procedure: RIGHT TOTAL MASTECTOMY;  Surgeon: Rolm Bookbinder, MD;  Location: [place];  Service: General;  Laterality: Right;   VAGINAL HYSTERECTOMY  1980   Unilateral oophorectomy   Patient Active Problem List   Diagnosis Date Noted   Snoring 04/18/2022   GERD (gastroesophageal reflux disease) 03/05/2021   S/P mastectomy, right 02/20/2021   Genetic testing 02/12/2021   Family history of breast cancer 01/23/2021   Family history of ovarian cancer 01/23/2021   Ductal carcinoma in situ (DCIS) of right breast 01/17/2021  Non-insulin dependent type 2 diabetes mellitus ([place]) 03/26/2020   Loss of weight 07/13/2019   Constipation 02/10/2017   AVM (arteriovenous malformation) of small bowel, acquired 02/10/2017   Iron deficiency anemia due to chronic blood loss 07/23/2016   Weight loss 04/01/2012   Chest pain on exertion 02/07/2011   Cerebrovascular disease 01/31/2010   CAD S/P percutaneous coronary angioplasty 01/03/2009   Dyslipidemia, goal LDL below 70 10/22/2007   Essential hypertension 10/22/2007    PCP: Lucianne Lei   REFERRING PROVIDER: Lucianne Lei  REFERRING DIAG: PT eval/tx for LBP  Rationale for Evaluation and  Treatment: Rehabilitation  THERAPY DIAG:   LBP Lumbar radiculopathy Muscle weakness  ONSET DATE: 10/31/2021  SUBJECTIVE:                                                                                                                                                                                           SUBJECTIVE STATEMENT: Pain increases with activity and " I have not been doing anything this morning" so 1/10 report of back pain; she reports she picked up a heavy case of water and strained left side of the neck and shoulder.   Evaluation: Pt states that she has been having back pain that goes down both her legs for about six months.  The pain will go down to her ankle.  She has recently been dx with Rt breast cancer. Pt states that she can stand for about 10-15 mintues.  She can walk for 10 to 15 minutes before she has to sit down.   PERTINENT HISTORY:  Hx of recently dx  breast cancer, DM, HTN  PAIN:  Are you having pain? Yes: NPRS scale: 2/10 today;  worst 5/10 Pain location: Low back into legs  Pain description: aching  Aggravating factors: wt bearing  Relieving factors: rest   PRECAUTIONS: recently dx breast cancer with recent surgery  no prone at this time   WEIGHT BEARING RESTRICTIONS: No  FALLS:  Has patient fallen in last 6 months? Yes. Number of falls 1  LIVING ENVIRONMENT: Lives with: lives with their family Lives in: House/apartment Stairs: Yes: Internal: 16 steps; on right going up Has following equipment at home: None  OCCUPATION: retired   PLOF: Independent  PATIENT GOALS: To be able to walk and stand longer, no pain in her legs.    NEXT MD VISIT:   OBJECTIVE:   PATIENT SURVEYS:  FOTO 78    COGNITION: Overall cognitive status: Within functional limits for tasks assessed     SENSATION: Not tested   PALPATION: Unremarkable   LUMBAR ROM:   AROM eval  Flexion Fingers to  toes going down causes increased pain   Extension 25; reps increase  pain.  Right lateral flexion   Left lateral flexion   Right rotation   Left rotation    (Blank rows = not tested)  LOWER EXTREMITY MMT:    MMT Right eval Left eval Right 05/28/22 Left 05/28/22  Hip flexion 5/5 3/5  4  Hip extension 2/5 2/5 3+ 3  Hip abduction 3+/5  3/5    Hip adduction      Hip internal rotation      Hip external rotation      Knee flexion 5/5 5/5    Knee extension 5/5 4/5    Ankle dorsiflexion 5/5 5/5    Ankle plantarflexion      Ankle inversion      Ankle eversion       (Blank rows = not tested)  FUNCTIONAL TESTS:  30 seconds chair stand test:  9 reps  2 minute walk test: 452 ft    TODAY'S TREATMENT:                                                                                                                              DATE:  06/17/21 Supine: LTR x 10 Bridge x 10 Active Hamstring stretch with towel 5 x 20" each Figure 4 stretch with towel 5 x 20" each  Sitting: Piriformis stretch 5 x 20" Hamstring stretch with strap 5 x 10"  Standing: Hip vectors 5 x 3" hold each Scapular retractions 2 x 10 RTB Shoulder extensions 2 x 10 RTB Slant board 5 x 20"    06/05/21 Prone:  lying 1 minute  POE 1'  Press ups 2x5 reps   Heelsqueezes 10X5" Sidelying: clams with stabilization 10X5" holds each LE Supine: Bridge 10X5"  SLR 10X each  Hamstring stretch 2X30" ea with towel Seated piriformis stretch 2X30" each  06/03/22 Prone: Prone lying x 1' ( pain down to 2 and down to left posterior thigh) Prone on elbows x 1 Prone press ups 2 x 5  Supine:   05/28/22 Prone lying x 1' Prone on elbows x 1'  Supine: Abdominal bracing 5" hold x 10 Abdominal bracing with bridge x 10 Hip adduction with ball and bridge x 5 Hip abduction with belt and bridge x 10   05/22/22 Ambulation around gym 4 laps Seated: piriformis stretch 2X30" each  Sit to stands no UE 10X Standing 3D hip excursions 10X each direction Supine: ab set 10X5"  Bridge  10X  Knee to chest stretch 3X30"  SLR with ab set 10X each  Hamstring stretch active 2X30"  Piriformis stretch 2X30"   Evaluation   Supine: Knee to chest stretch 3 x 30" Abdominal set x 10 Bridge x 10    PATIENT EDUCATION:  Education details: HEP Person educated: Patient Education method: Consulting civil engineer, Verbal cues, and Handouts Education comprehension: verbalized understanding and returned demonstration  HOME EXERCISE PROGRAM: 06/03/2022 seated scapular retractions, standing lumbar  extensions 05/28/22 prone on elbows, prone press ups, bridge with ball, bridge with belt Access Code: [email or web address] URL: [email or web address] Date: 05/20/2022 Prepared by: Rayetta Humphrey  Exercises - Supine Transversus Abdominis Bracing - Hands on Stomach  - 2 x daily - 7 x weekly - 1 sets - 10 reps - 5" hold - Supine Bridge  - 2 x daily - 7 x weekly - 1 sets - 10 reps - 5-10" hold - Supine Single Knee to Chest Stretch  - 2 x daily - 7 x weekly - 1 sets - 3 reps - 30" hold  ASSESSMENT:  CLINICAL IMPRESSION: Continued with focus on reducing symptoms and improving core and LE strength.  Patient feels stretching helps her the most so initiated treatment with stretching today. Progressed to standing strengthening today with minimal increased pain. Could not update HEP due to [place] being down.  Pt will benefit from continued skilled therapy services to address deficits and promote return to optimal function.      OBJECTIVE IMPAIRMENTS: decreased activity tolerance, decreased balance, difficulty walking, decreased strength, and pain.   ACTIVITY LIMITATIONS: carrying, lifting, standing, stairs, and locomotion level  PARTICIPATION LIMITATIONS: meal prep, cleaning, shopping, and community activity  PERSONAL FACTORS: Age and 1 comorbidity: recent dx of breast cancer  are also affecting patient's functional outcome.   REHAB POTENTIAL: Good  CLINICAL DECISION MAKING:  Stable/uncomplicated  EVALUATION COMPLEXITY: Moderate   GOALS: Goals reviewed with patient? Yes  SHORT TERM GOALS: Target date: 06/11/21   PT to be I in HEP in order to decrease pain to no greater than a 3/10 Baseline: Goal status: IN PROGRESS  2.  PT core and LE strength to be increased 1/2 grade to allow pt to be able to get up from a low lying couch easier Baseline:  Goal status: IN PROGRESS  3.  PT to be able to stand/walk for   30  minutes in order to complete meal prep.  Baseline:  Goal status: IN PROGRESS  4.  Pt pain to go no further than her knee area to demonstrate decreased nerve irritation  Baseline:  Goal status: IN PROGRESS  5.  Pt to understand the importance of using proper body mechanics for bed mobility and lifting  Baseline:  Goal status: IN PROGRESS  LONG TERM GOALS: Target date: 07/01/22  PT to be I in HEP in order to decrease pain to no greater than a 1/10 Baseline:  Goal status: IN PROGRESS  2.  PT core and LE strength to be increased 1 grade to allow pt to be able to get up and down her steps at home easier  Baseline:  Goal status: IN PROGRESS  3.  PT to be able to stand/walk for 60  minutes in order to be able to go shopping with her family .  Baseline:  Goal status: IN PROGRESS  4.  Pt pain to only radiate to the buttock area  Baseline:  Goal status: IN PROGRESS  PLAN:  PT FREQUENCY: 2x/week  PT DURATION: 6 weeks  PLANNED INTERVENTIONS: Therapeutic exercises, Therapeutic activity, Neuromuscular re-education, Balance training, Patient/Family education, Self Care, and Manual therapy.  PLAN FOR NEXT SESSION: Progress core and LE strengthening.  Add stretching to medbridge HEP 11:11 AM, 06/17/22 Laikynn Pollio Small Briel Gallicchio MPT [facility] [place] [phone]

## 2022-06-19 ENCOUNTER — Ambulatory Visit (HOSPITAL_COMMUNITY): Payer: PPO

## 2022-06-19 DIAGNOSIS — M5416 Radiculopathy, lumbar region: Secondary | ICD-10-CM | POA: Diagnosis not present

## 2022-06-19 DIAGNOSIS — D0511 Intraductal carcinoma in situ of right breast: Secondary | ICD-10-CM

## 2022-06-19 DIAGNOSIS — M6281 Muscle weakness (generalized): Secondary | ICD-10-CM

## 2022-06-19 NOTE — Therapy (Signed)
OUTPATIENT PHYSICAL THERAPY TREATMENT  Patient Name: Nancy Liu MRN: [id number] DOB:06/14/1940, 82 y.o., female Today's Date: 06/19/2022   END OF SESSION:   PT End of Session - 06/19/22 0948     Visit Number 7    Number of Visits 12    Date for PT Re-Evaluation 07/01/22    Authorization Type [employer]    PT Start Time 9323    PT Stop Time 5573    PT Time Calculation (min) 40 min                   Past Medical History:  Diagnosis Date   Arteriosclerotic cardiovascular disease (ASCVD) 2001   Bare-metal stent placed in the right coronary artery in 12/01; residual 50% lesion of the first diagonal and mid LAD   Breast cancer ([place])    Cerebrovascular disease    COPD (chronic obstructive pulmonary disease) ([place])    Cyst, dermoid, arm, left 03/26/2018   Diabetes mellitus    excellent control with a low-dose of a single oral agent   Family history of breast cancer 01/23/2021   Family history of ovarian cancer 01/23/2021   GERD (gastroesophageal reflux disease)    with ulcers   History of right coronary artery stent placement 2000   Hyperlipidemia    Hypertension    Sleep apnea    Thalassemia minor    Tobacco abuse, in remission    35 pack years; Quit in 1980   Past Surgical History:  Procedure Laterality Date   BIOPSY  02/28/2020   Procedure: BIOPSY;  Surgeon: Eloise Harman, DO;  [facility];  Service: Endoscopy;;   COLONOSCOPY  2006   Dr. Collene Mares: normal   COLONOSCOPY  2000   Dr. Everlean Cherry: normal    COLONOSCOPY N/A 03/03/2016   Dr. Oneida Alar: diverticulosis, non-bleeding hemorrhoids, redundant left colon.   DILATION AND CURETTAGE OF UTERUS     ESOPHAGOGASTRODUODENOSCOPY  2000   Dr. Everlean Cherry: gastritis    ESOPHAGOGASTRODUODENOSCOPY  2006   Dr. Collene Mares: small hiatal hernia, gastritis, negative H.pylori    ESOPHAGOGASTRODUODENOSCOPY N/A 03/03/2016   Procedure: ESOPHAGOGASTRODUODENOSCOPY (EGD);  Surgeon: Danie Binder, MD;  [facility];  Service: Endoscopy;  Laterality: N/A;   ESOPHAGOGASTRODUODENOSCOPY N/A 06/03/2016   Dr. Oneida Alar: nonaggressive gastritis due to aspirin use. Previous ulcers had healed.   ESOPHAGOGASTRODUODENOSCOPY (EGD) WITH PROPOFOL N/A 02/28/2020   focal inflammation in the gastric antrum consistent with gastritis on biopsies. No H.pylori.    EXCISION OF KELOID Left 03/26/2018   Procedure: EXCISION OF LEFT ARM MASS;  Surgeon: Fanny Skates, MD;  Location: [place];  Service: General;  Laterality: Left;   GIVENS CAPSULE STUDY N/A 10/10/2020   Procedure: GIVENS CAPSULE STUDY;  Surgeon: Eloise Harman, DO;  [facility];  Service: Endoscopy;  Laterality: N/A;  7:30am   INGUINAL HERNIA REPAIR  1970s   Right   TOTAL MASTECTOMY Right 02/20/2021   Procedure: RIGHT TOTAL MASTECTOMY;  Surgeon: Rolm Bookbinder, MD;  Location: [place];  Service: General;  Laterality: Right;   VAGINAL HYSTERECTOMY  1980   Unilateral oophorectomy   Patient Active Problem List   Diagnosis Date Noted   Snoring 04/18/2022   GERD (gastroesophageal reflux disease) 03/05/2021   S/P mastectomy, right 02/20/2021   Genetic testing 02/12/2021   Family history of breast cancer 01/23/2021   Family history of ovarian cancer 01/23/2021   Ductal carcinoma in situ (DCIS) of right breast 01/17/2021  Non-insulin dependent type 2 diabetes mellitus ([place]) 03/26/2020   Loss of weight 07/13/2019   Constipation 02/10/2017   AVM (arteriovenous malformation) of small bowel, acquired 02/10/2017   Iron deficiency anemia due to chronic blood loss 07/23/2016   Weight loss 04/01/2012   Chest pain on exertion 02/07/2011   Cerebrovascular disease 01/31/2010   CAD S/P percutaneous coronary angioplasty 01/03/2009   Dyslipidemia, goal LDL below 70 10/22/2007   Essential hypertension 10/22/2007    PCP: Lucianne Lei   REFERRING PROVIDER: Lucianne Lei  REFERRING DIAG: PT eval/tx for LBP  Rationale for Evaluation and  Treatment: Rehabilitation  THERAPY DIAG:   LBP Lumbar radiculopathy Muscle weakness  ONSET DATE: 10/31/2021  SUBJECTIVE:                                                                                                                                                                                           SUBJECTIVE STATEMENT: "I have not done much this morning" so minimal back pain; 2/10 pain today; still a little sore left shoulder from picking up heavy water container.   Evaluation: Pt states that she has been having back pain that goes down both her legs for about six months.  The pain will go down to her ankle.  She has recently been dx with Rt breast cancer. Pt states that she can stand for about 10-15 mintues.  She can walk for 10 to 15 minutes before she has to sit down.   PERTINENT HISTORY:  Hx of recently dx  breast cancer, DM, HTN  PAIN:  Are you having pain? Yes: NPRS scale: 2/10 today;  worst 5/10 Pain location: Low back into legs  Pain description: aching  Aggravating factors: wt bearing  Relieving factors: rest   PRECAUTIONS: recently dx breast cancer with recent surgery  no prone at this time   WEIGHT BEARING RESTRICTIONS: No  FALLS:  Has patient fallen in last 6 months? Yes. Number of falls 1  LIVING ENVIRONMENT: Lives with: lives with their family Lives in: House/apartment Stairs: Yes: Internal: 16 steps; on right going up Has following equipment at home: None  OCCUPATION: retired   PLOF: Independent  PATIENT GOALS: To be able to walk and stand longer, no pain in her legs.    NEXT MD VISIT:   OBJECTIVE:   PATIENT SURVEYS:  FOTO 88    COGNITION: Overall cognitive status: Within functional limits for tasks assessed     SENSATION: Not tested   PALPATION: Unremarkable   LUMBAR ROM:   AROM eval  Flexion Fingers to toes going down causes increased pain   Extension 25; reps increase pain.  Right lateral flexion   Left lateral flexion    Right rotation   Left rotation    (Blank rows = not tested)  LOWER EXTREMITY MMT:    MMT Right eval Left eval Right 05/28/22 Left 05/28/22  Hip flexion 5/5 3/5  4  Hip extension 2/5 2/5 3+ 3  Hip abduction 3+/5  3/5    Hip adduction      Hip internal rotation      Hip external rotation      Knee flexion 5/5 5/5    Knee extension 5/5 4/5    Ankle dorsiflexion 5/5 5/5    Ankle plantarflexion      Ankle inversion      Ankle eversion       (Blank rows = not tested)  FUNCTIONAL TESTS:  30 seconds chair stand test:  9 reps  2 minute walk test: 452 ft    TODAY'S TREATMENT:                                                                                                                              DATE:  06/19/22 Supine: LTR x 15 Bridge 2 x 10 Hamstring stretch with towel 5 x 20" Figure 4 stretch with towel 5 x 20"  Prone: Glute sets 5" hold x 10 Hip extension x 10 each  Sitting: Piriformis stretch 5 x 20" each  Standing: RTB scapular retractions 2 x 10 RTB shoulder extensions 2 x 10   06/17/22 Supine: LTR x 10 Bridge x 10 Active Hamstring stretch with towel 5 x 20" each Figure 4 stretch with towel 5 x 20" each  Sitting: Piriformis stretch 5 x 20" Hamstring stretch with strap 5 x 10"  Standing: Hip vectors 5 x 3" hold each Scapular retractions 2 x 10 RTB Shoulder extensions 2 x 10 RTB Slant board 5 x 20"    06/05/21 Prone:  lying 1 minute  POE 1'  Press ups 2x5 reps   Heelsqueezes 10X5" Sidelying: clams with stabilization 10X5" holds each LE Supine: Bridge 10X5"  SLR 10X each  Hamstring stretch 2X30" ea with towel Seated piriformis stretch 2X30" each  06/03/22 Prone: Prone lying x 1' ( pain down to 2 and down to left posterior thigh) Prone on elbows x 1 Prone press ups 2 x 5  Supine:   05/28/22 Prone lying x 1' Prone on elbows x 1'  Supine: Abdominal bracing 5" hold x 10 Abdominal bracing with bridge x 10 Hip adduction with ball  and bridge x 5 Hip abduction with belt and bridge x 10   05/22/22 Ambulation around gym 4 laps Seated: piriformis stretch 2X30" each  Sit to stands no UE 10X Standing 3D hip excursions 10X each direction Supine: ab set 10X5"  Bridge 10X  Knee to chest stretch 3X30"  SLR with ab set 10X each  Hamstring stretch active 2X30"  Piriformis stretch 2X30"   Evaluation   Supine: Knee to chest  stretch 3 x 30" Abdominal set x 10 Bridge x 10    PATIENT EDUCATION:  Education details: HEP Person educated: Patient Education method: Consulting civil engineer, Verbal cues, and Handouts Education comprehension: verbalized understanding and returned demonstration  HOME EXERCISE PROGRAM: 06/19/22 hamstring stretch, figure 4 stretch 06/03/2022 seated scapular retractions, standing lumbar extensions 05/28/22 prone on elbows, prone press ups, bridge with ball, bridge with belt Access Code: [email or web address] URL: [email or web address] Date: 05/20/2022 Prepared by: Rayetta Humphrey  Exercises - Supine Transversus Abdominis Bracing - Hands on Stomach  - 2 x daily - 7 x weekly - 1 sets - 10 reps - 5" hold - Supine Bridge  - 2 x daily - 7 x weekly - 1 sets - 10 reps - 5-10" hold - Supine Single Knee to Chest Stretch  - 2 x daily - 7 x weekly - 1 sets - 3 reps - 30" hold  ASSESSMENT:  CLINICAL IMPRESSION: Continued with focus on reducing symptoms and improving core and LE strength.   Updated HEP with stretches and theraband retractions. Pt will benefit from continued skilled therapy services to address deficits and promote return to optimal function.      OBJECTIVE IMPAIRMENTS: decreased activity tolerance, decreased balance, difficulty walking, decreased strength, and pain.   ACTIVITY LIMITATIONS: carrying, lifting, standing, stairs, and locomotion level  PARTICIPATION LIMITATIONS: meal prep, cleaning, shopping, and community activity  PERSONAL FACTORS: Age and 1 comorbidity: recent dx of breast  cancer  are also affecting patient's functional outcome.   REHAB POTENTIAL: Good  CLINICAL DECISION MAKING: Stable/uncomplicated  EVALUATION COMPLEXITY: Moderate   GOALS: Goals reviewed with patient? Yes  SHORT TERM GOALS: Target date: 06/11/21   PT to be I in HEP in order to decrease pain to no greater than a 3/10 Baseline: Goal status: IN PROGRESS  2.  PT core and LE strength to be increased 1/2 grade to allow pt to be able to get up from a low lying couch easier Baseline:  Goal status: IN PROGRESS  3.  PT to be able to stand/walk for   30  minutes in order to complete meal prep.  Baseline:  Goal status: IN PROGRESS  4.  Pt pain to go no further than her knee area to demonstrate decreased nerve irritation  Baseline:  Goal status: IN PROGRESS  5.  Pt to understand the importance of using proper body mechanics for bed mobility and lifting  Baseline:  Goal status: IN PROGRESS  LONG TERM GOALS: Target date: 07/01/22  PT to be I in HEP in order to decrease pain to no greater than a 1/10 Baseline:  Goal status: IN PROGRESS  2.  PT core and LE strength to be increased 1 grade to allow pt to be able to get up and down her steps at home easier  Baseline:  Goal status: IN PROGRESS  3.  PT to be able to stand/walk for 60  minutes in order to be able to go shopping with her family .  Baseline:  Goal status: IN PROGRESS  4.  Pt pain to only radiate to the buttock area  Baseline:  Goal status: IN PROGRESS  PLAN:  PT FREQUENCY: 2x/week  PT DURATION: 6 weeks  PLANNED INTERVENTIONS: Therapeutic exercises, Therapeutic activity, Neuromuscular re-education, Balance training, Patient/Family education, Self Care, and Manual therapy.  PLAN FOR NEXT SESSION: Progress core and LE strengthening  10:27 AM, 06/19/22 Vickey Ewbank Small Alisse Tuite MPT [facility] [place] [phone]

## 2022-06-24 ENCOUNTER — Ambulatory Visit (HOSPITAL_COMMUNITY): Payer: PPO | Admitting: Physical Therapy

## 2022-06-24 DIAGNOSIS — M5416 Radiculopathy, lumbar region: Secondary | ICD-10-CM

## 2022-06-24 DIAGNOSIS — R293 Abnormal posture: Secondary | ICD-10-CM

## 2022-06-24 DIAGNOSIS — M6281 Muscle weakness (generalized): Secondary | ICD-10-CM

## 2022-06-24 DIAGNOSIS — R600 Localized edema: Secondary | ICD-10-CM

## 2022-06-24 NOTE — Therapy (Signed)
OUTPATIENT PHYSICAL THERAPY TREATMENT  Patient Name: Nancy Liu MRN: [id number] DOB:Dec 19, 1940, 82 y.o., female Today's Date: 06/24/2022   END OF SESSION:   PT End of Session - 06/24/22 1025    Visit Number 8    Number of Visits 12    Date for PT Re-Evaluation 07/01/22    Authorization Type [employer]    PT Start Time 0945    PT Stop Time 1025    PT Time Calculation (min) 40 min    Activity Tolerance Patient tolerated treatment well    Behavior During Therapy [facility] for tasks assessed/performed                   Past Medical History:  Diagnosis Date   Arteriosclerotic cardiovascular disease (ASCVD) 2001   Bare-metal stent placed in the right coronary artery in 12/01; residual 50% lesion of the first diagonal and mid LAD   Breast cancer ([place])    Cerebrovascular disease    COPD (chronic obstructive pulmonary disease) ([place])    Cyst, dermoid, arm, left 03/26/2018   Diabetes mellitus    excellent control with a low-dose of a single oral agent   Family history of breast cancer 01/23/2021   Family history of ovarian cancer 01/23/2021   GERD (gastroesophageal reflux disease)    with ulcers   History of right coronary artery stent placement 2000   Hyperlipidemia    Hypertension    Sleep apnea    Thalassemia minor    Tobacco abuse, in remission    35 pack years; Quit in 1980   Past Surgical History:  Procedure Laterality Date   BIOPSY  02/28/2020   Procedure: BIOPSY;  Surgeon: Eloise Harman, DO;  [facility];  Service: Endoscopy;;   COLONOSCOPY  2006   Dr. Collene Mares: normal   COLONOSCOPY  2000   Dr. Everlean Cherry: normal    COLONOSCOPY N/A 03/03/2016   Dr. Oneida Alar: diverticulosis, non-bleeding hemorrhoids, redundant left colon.   DILATION AND CURETTAGE OF UTERUS     ESOPHAGOGASTRODUODENOSCOPY  2000   Dr. Everlean Cherry: gastritis    ESOPHAGOGASTRODUODENOSCOPY  2006   Dr. Collene Mares: small hiatal hernia, gastritis, negative H.pylori     ESOPHAGOGASTRODUODENOSCOPY N/A 03/03/2016   Procedure: ESOPHAGOGASTRODUODENOSCOPY (EGD);  Surgeon: Danie Binder, MD;  [facility];  Service: Endoscopy;  Laterality: N/A;   ESOPHAGOGASTRODUODENOSCOPY N/A 06/03/2016   Dr. Oneida Alar: nonaggressive gastritis due to aspirin use. Previous ulcers had healed.   ESOPHAGOGASTRODUODENOSCOPY (EGD) WITH PROPOFOL N/A 02/28/2020   focal inflammation in the gastric antrum consistent with gastritis on biopsies. No H.pylori.    EXCISION OF KELOID Left 03/26/2018   Procedure: EXCISION OF LEFT ARM MASS;  Surgeon: Fanny Skates, MD;  Location: [place];  Service: General;  Laterality: Left;   GIVENS CAPSULE STUDY N/A 10/10/2020   Procedure: GIVENS CAPSULE STUDY;  Surgeon: Eloise Harman, DO;  [facility];  Service: Endoscopy;  Laterality: N/A;  7:30am   INGUINAL HERNIA REPAIR  1970s   Right   TOTAL MASTECTOMY Right 02/20/2021   Procedure: RIGHT TOTAL MASTECTOMY;  Surgeon: Rolm Bookbinder, MD;  Location: [place];  Service: General;  Laterality: Right;   VAGINAL HYSTERECTOMY  1980   Unilateral oophorectomy   Patient Active Problem List   Diagnosis Date Noted   Snoring 04/18/2022   GERD (gastroesophageal reflux disease) 03/05/2021   S/P mastectomy, right 02/20/2021   Genetic testing 02/12/2021   Family history of breast cancer 01/23/2021   Family  history of ovarian cancer 01/23/2021   Ductal carcinoma in situ (DCIS) of right breast 01/17/2021   Non-insulin dependent type 2 diabetes mellitus ([place]) 03/26/2020   Loss of weight 07/13/2019   Constipation 02/10/2017   AVM (arteriovenous malformation) of small bowel, acquired 02/10/2017   Iron deficiency anemia due to chronic blood loss 07/23/2016   Weight loss 04/01/2012   Chest pain on exertion 02/07/2011   Cerebrovascular disease 01/31/2010   CAD S/P percutaneous coronary angioplasty 01/03/2009   Dyslipidemia, goal LDL below 70 10/22/2007   Essential hypertension  10/22/2007    PCP: Lucianne Lei   REFERRING PROVIDER: Lucianne Lei  REFERRING DIAG: PT eval/tx for LBP  Rationale for Evaluation and Treatment: Rehabilitation  THERAPY DIAG:   LBP Lumbar radiculopathy Muscle weakness  ONSET DATE: 10/31/2021                                                                                                                                                                                  SUBJECTIVE STATEMENT: pt states that she does her exercises once or twice after she leaves here.    PERTINENT HISTORY:  Hx of recently dx  breast cancer, DM, HTN  PAIN:  Are you having pain? Yes: NPRS scale: 0/10 today;  worst 2/10 Pain location: Low back into legs  Pain description: aching  Aggravating factors: wt bearing  Relieving factors: rest   PRECAUTIONS: recently dx breast cancer with recent surgery  no prone at this time   WEIGHT BEARING RESTRICTIONS: No  FALLS:  Has patient fallen in last 6 months? Yes. Number of falls 1  LIVING ENVIRONMENT: Lives with: lives with their family Lives in: House/apartment Stairs: Yes: Internal: 16 steps; on right going up Has following equipment at home: None  OCCUPATION: retired   PLOF: Independent  PATIENT GOALS: To be able to walk and stand longer, no pain in her legs.    NEXT MD VISIT:   OBJECTIVE:   PATIENT SURVEYS:  FOTO 54    LUMBAR ROM:   AROM eval  Flexion Fingers to toes going down causes increased pain   Extension 25; reps increase pain.  Right lateral flexion   Left lateral flexion   Right rotation   Left rotation    (Blank rows = not tested)  LOWER EXTREMITY MMT:    MMT Right eval Left eval Right 05/28/22 Left 05/28/22  Hip flexion 5/5 3/5  4  Hip extension 2/5 2/5 3+ 3  Hip abduction 3+/5  3/5    Hip adduction      Hip internal rotation      Hip external rotation      Knee flexion 5/5 5/5  Knee extension 5/5 4/5    Ankle dorsiflexion 5/5 5/5    Ankle plantarflexion       Ankle inversion      Ankle eversion       (Blank rows = not tested)  FUNCTIONAL TESTS:  30 seconds chair stand test:  9 reps  2 minute walk test: 452 ft    TODAY'S TREATMENT:                                                                                                                              DATE:  06/24/2022 Standing: Theraband exercises: Scapular retraction x 10 Rows x 10 Shoulder extension x 10 Hip excursions x 3 Heel raises x 10  Functional squat x 10  Side step x 10  Sitting : Sit to stand: x 10 Piriformis stretch x 3 B  Supine: Knee to chest x 3 B  Bridge x 15   06/19/22 Supine: LTR x 15 Bridge 2 x 10 Hamstring stretch with towel 5 x 20" Figure 4 stretch with towel 5 x 20"  Prone: Glute sets 5" hold x 10 Hip extension x 10 each  Sitting: Piriformis stretch 5 x 20" each  Standing: RTB scapular retractions 2 x 10 RTB shoulder extensions 2 x 10   06/17/22 Supine: LTR x 10 Bridge x 10 Active Hamstring stretch with towel 5 x 20" each Figure 4 stretch with towel 5 x 20" each  Sitting: Piriformis stretch 5 x 20" Hamstring stretch with strap 5 x 10"  Standing: Hip vectors 5 x 3" hold each Scapular retractions 2 x 10 RTB Shoulder extensions 2 x 10 RTB Slant board 5 x 20"    06/05/21 Prone:  lying 1 minute  POE 1'  Press ups 2x5 reps   Heelsqueezes 10X5" Sidelying: clams with stabilization 10X5" holds each LE Supine: Bridge 10X5"  SLR 10X each  Hamstring stretch 2X30" ea with towel Seated piriformis stretch 2X30" each  06/03/22 Prone: Prone lying x 1' ( pain down to 2 and down to left posterior thigh) Prone on elbows x 1 Prone press ups 2 x 5  Supine:   PATIENT EDUCATION:  Education details: HEP Person educated: Patient Education method: Consulting civil engineer, Verbal cues, and Handouts Education comprehension: verbalized understanding and returned demonstration  HOME EXERCISE PROGRAM: 06/24/2022 - Heel Raises with Counter Support   - 1 x daily - 7 x weekly - 1 sets - 10 reps - 5" hold - Mini Squat with Counter Support  - 1 x daily - 7 x weekly - 1 sets - 10 reps - 5" hold - Sit to Stand  - 1 x daily - 7 x weekly - 1 sets - 10 reps 06/19/22 hamstring stretch, figure 4 stretch 06/03/2022 seated scapular retractions, standing lumbar extensions 05/28/22 prone on elbows, prone press ups, bridge with ball, bridge with belt Access Code: [email or web address] URL: [email or web address] Date: 05/20/2022 Prepared by: Rayetta Humphrey  Exercises - Supine Transversus Abdominis  Bracing - Hands on Stomach  - 2 x daily - 7 x weekly - 1 sets - 10 reps - 5" hold - Supine Bridge  - 2 x daily - 7 x weekly - 1 sets - 10 reps - 5-10" hold - Supine Single Knee to Chest Stretch  - 2 x daily - 7 x weekly - 1 sets - 3 reps - 30" hold  ASSESSMENT:  CLINICAL IMPRESSION: Progressed pt to standing and sitting exercises with HEP updated.  PT needed verbal and tactile cuing for proper technique but able to demonstrate good form following cuing. Educated pt on proper body mechanics for bed mobility but will need review.  Pt will benefit from continued skilled therapy services to address deficits and promote return to optimal function.      OBJECTIVE IMPAIRMENTS: decreased activity tolerance, decreased balance, difficulty walking, decreased strength, and pain.   ACTIVITY LIMITATIONS: carrying, lifting, standing, stairs, and locomotion level  PARTICIPATION LIMITATIONS: meal prep, cleaning, shopping, and community activity  PERSONAL FACTORS: Age and 1 comorbidity: recent dx of breast cancer  are also affecting patient's functional outcome.   REHAB POTENTIAL: Good  CLINICAL DECISION MAKING: Stable/uncomplicated  EVALUATION COMPLEXITY: Moderate   GOALS: Goals reviewed with patient? Yes  SHORT TERM GOALS: Target date: 06/11/21   PT to be I in HEP in order to decrease pain to no greater than a 3/10 Baseline: Goal status: IN PROGRESS  2.   PT core and LE strength to be increased 1/2 grade to allow pt to be able to get up from a low lying couch easier Baseline:  Goal status: IN PROGRESS  3.  PT to be able to stand/walk for   30  minutes in order to complete meal prep.  Baseline:  Goal status: IN PROGRESS  4.  Pt pain to go no further than her knee area to demonstrate decreased nerve irritation  Baseline:  Goal status: IN PROGRESS  5.  Pt to understand the importance of using proper body mechanics for bed mobility and lifting  Baseline:  Goal status: IN PROGRESS  LONG TERM GOALS: Target date: 07/01/22  PT to be I in HEP in order to decrease pain to no greater than a 1/10 Baseline:  Goal status: IN PROGRESS  2.  PT core and LE strength to be increased 1 grade to allow pt to be able to get up and down her steps at home easier  Baseline:  Goal status: IN PROGRESS  3.  PT to be able to stand/walk for 60  minutes in order to be able to go shopping with her family .  Baseline:  Goal status: IN PROGRESS  4.  Pt pain to only radiate to the buttock area  Baseline:  Goal status: IN PROGRESS  PLAN:  PT FREQUENCY: 2x/week  PT DURATION: 6 weeks  PLANNED INTERVENTIONS: Therapeutic exercises, Therapeutic activity, Neuromuscular re-education, Balance training, Patient/Family education, Self Care, and Manual therapy.  PLAN FOR NEXT SESSION: Progress core and LE strengthening. Rayetta Humphrey, [place] CLT [phone]

## 2022-06-26 ENCOUNTER — Ambulatory Visit (HOSPITAL_COMMUNITY): Payer: PPO

## 2022-06-26 DIAGNOSIS — M6281 Muscle weakness (generalized): Secondary | ICD-10-CM

## 2022-06-26 DIAGNOSIS — R293 Abnormal posture: Secondary | ICD-10-CM

## 2022-06-26 DIAGNOSIS — M5416 Radiculopathy, lumbar region: Secondary | ICD-10-CM | POA: Diagnosis not present

## 2022-06-26 NOTE — Therapy (Signed)
OUTPATIENT PHYSICAL THERAPY TREATMENT  Patient Name: Nancy Liu MRN: [id number] DOB:November 14, 1940, 82 y.o., female Today's Date: 06/26/2022   END OF SESSION:  END OF SESSION:   PT End of Session - 06/26/22 1031     Visit Number 9    Number of Visits 12    Date for PT Re-Evaluation 07/01/22    Authorization Type [employer]    PT Start Time 1031    PT Stop Time 1114    PT Time Calculation (min) 43 min    Activity Tolerance Patient tolerated treatment well    Behavior During Therapy [facility] for tasks assessed/performed              Past Medical History:  Diagnosis Date   Arteriosclerotic cardiovascular disease (ASCVD) 2001   Bare-metal stent placed in the right coronary artery in 12/01; residual 50% lesion of the first diagonal and mid LAD   Breast cancer ([place])    Cerebrovascular disease    COPD (chronic obstructive pulmonary disease) ([place])    Cyst, dermoid, arm, left 03/26/2018   Diabetes mellitus    excellent control with a low-dose of a single oral agent   Family history of breast cancer 01/23/2021   Family history of ovarian cancer 01/23/2021   GERD (gastroesophageal reflux disease)    with ulcers   History of right coronary artery stent placement 2000   Hyperlipidemia    Hypertension    Sleep apnea    Thalassemia minor    Tobacco abuse, in remission    35 pack years; Quit in 1980   Past Surgical History:  Procedure Laterality Date   BIOPSY  02/28/2020   Procedure: BIOPSY;  Surgeon: Eloise Harman, DO;  [facility];  Service: Endoscopy;;   COLONOSCOPY  2006   Dr. Collene Mares: normal   COLONOSCOPY  2000   Dr. Everlean Cherry: normal    COLONOSCOPY N/A 03/03/2016   Dr. Oneida Alar: diverticulosis, non-bleeding hemorrhoids, redundant left colon.   DILATION AND CURETTAGE OF UTERUS     ESOPHAGOGASTRODUODENOSCOPY  2000   Dr. Everlean Cherry: gastritis    ESOPHAGOGASTRODUODENOSCOPY  2006   Dr. Collene Mares: small hiatal hernia, gastritis, negative H.pylori     ESOPHAGOGASTRODUODENOSCOPY N/A 03/03/2016   Procedure: ESOPHAGOGASTRODUODENOSCOPY (EGD);  Surgeon: Danie Binder, MD;  [facility];  Service: Endoscopy;  Laterality: N/A;   ESOPHAGOGASTRODUODENOSCOPY N/A 06/03/2016   Dr. Oneida Alar: nonaggressive gastritis due to aspirin use. Previous ulcers had healed.   ESOPHAGOGASTRODUODENOSCOPY (EGD) WITH PROPOFOL N/A 02/28/2020   focal inflammation in the gastric antrum consistent with gastritis on biopsies. No H.pylori.    EXCISION OF KELOID Left 03/26/2018   Procedure: EXCISION OF LEFT ARM MASS;  Surgeon: Fanny Skates, MD;  Location: [place];  Service: General;  Laterality: Left;   GIVENS CAPSULE STUDY N/A 10/10/2020   Procedure: GIVENS CAPSULE STUDY;  Surgeon: Eloise Harman, DO;  [facility];  Service: Endoscopy;  Laterality: N/A;  7:30am   INGUINAL HERNIA REPAIR  1970s   Right   TOTAL MASTECTOMY Right 02/20/2021   Procedure: RIGHT TOTAL MASTECTOMY;  Surgeon: Rolm Bookbinder, MD;  Location: [place];  Service: General;  Laterality: Right;   VAGINAL HYSTERECTOMY  1980   Unilateral oophorectomy   Patient Active Problem List   Diagnosis Date Noted   Snoring 04/18/2022   GERD (gastroesophageal reflux disease) 03/05/2021   S/P mastectomy, right 02/20/2021   Genetic testing 02/12/2021   Family history of breast cancer 01/23/2021   Family  history of ovarian cancer 01/23/2021   Ductal carcinoma in situ (DCIS) of right breast 01/17/2021   Non-insulin dependent type 2 diabetes mellitus ([place]) 03/26/2020   Loss of weight 07/13/2019   Constipation 02/10/2017   AVM (arteriovenous malformation) of small bowel, acquired 02/10/2017   Iron deficiency anemia due to chronic blood loss 07/23/2016   Weight loss 04/01/2012   Chest pain on exertion 02/07/2011   Cerebrovascular disease 01/31/2010   CAD S/P percutaneous coronary angioplasty 01/03/2009   Dyslipidemia, goal LDL below 70 10/22/2007   Essential hypertension  10/22/2007    PCP: Lucianne Lei   REFERRING PROVIDER: Lucianne Lei  REFERRING DIAG: PT eval/tx for LBP  Rationale for Evaluation and Treatment: Rehabilitation  THERAPY DIAG:   LBP Lumbar radiculopathy Muscle weakness  ONSET DATE: 10/31/2021                                                                                                                                                                                  SUBJECTIVE STATEMENT: no back pain; "feeling good";  she does report a dry cough; wearing a mask   PERTINENT HISTORY:  Hx of recently dx  breast cancer, DM, HTN  PAIN:  Are you having pain? Yes: NPRS scale: 0/10 today;  worst 2/10 Pain location: Low back into legs  Pain description: aching  Aggravating factors: wt bearing  Relieving factors: rest   PRECAUTIONS: recently dx breast cancer with recent surgery  no prone at this time   WEIGHT BEARING RESTRICTIONS: No  FALLS:  Has patient fallen in last 6 months? Yes. Number of falls 1  LIVING ENVIRONMENT: Lives with: lives with their family Lives in: House/apartment Stairs: Yes: Internal: 16 steps; on right going up Has following equipment at home: None  OCCUPATION: retired   PLOF: Independent  PATIENT GOALS: To be able to walk and stand longer, no pain in her legs.    NEXT MD VISIT:   OBJECTIVE:   PATIENT SURVEYS:  FOTO 54    LUMBAR ROM:   AROM eval  Flexion Fingers to toes going down causes increased pain   Extension 25; reps increase pain.  Right lateral flexion   Left lateral flexion   Right rotation   Left rotation    (Blank rows = not tested)  LOWER EXTREMITY MMT:    MMT Right eval Left eval Right 05/28/22 Left 05/28/22  Hip flexion 5/5 3/5  4  Hip extension 2/5 2/5 3+ 3  Hip abduction 3+/5  3/5    Hip adduction      Hip internal rotation      Hip external rotation      Knee flexion 5/5 5/5  Knee extension 5/5 4/5    Ankle dorsiflexion 5/5 5/5    Ankle plantarflexion       Ankle inversion      Ankle eversion       (Blank rows = not tested)  FUNCTIONAL TESTS:  30 seconds chair stand test:  9 reps  2 minute walk test: 452 ft    TODAY'S TREATMENT:                                                                                                                              DATE:  06/26/2022 Supine: LTR x 15 SKTC 10" x 5 each Bridge x 10  Standing: Heel/toe x 20 Squat to chair to target 2 x 10 Shoulder retractions RTB 2 x 10 Shoulder extensions RTB 2 x 10 Hip vectors 2" hold x 8 each leg Wall angels x 10 Wall push ups x 10 Slant board 5 x 20" Hamstring stretch 5 x 20"  Sitting: Piriformis stretch 5 x 20" each   06/24/2022 Standing: Theraband exercises: Scapular retraction x 10 Rows x 10 Shoulder extension x 10 Hip excursions x 3 Heel raises x 10  Functional squat x 10  Side step x 10  Sitting : Sit to stand: x 10 Piriformis stretch x 3 B  Supine: Knee to chest x 3 B  Bridge x 15   06/19/22 Supine: LTR x 15 Bridge 2 x 10 Hamstring stretch with towel 5 x 20" Figure 4 stretch with towel 5 x 20"  Prone: Glute sets 5" hold x 10 Hip extension x 10 each  Sitting: Piriformis stretch 5 x 20" each  Standing: RTB scapular retractions 2 x 10 RTB shoulder extensions 2 x 10    06/17/22 Supine: LTR x 10 Bridge x 10 Active Hamstring stretch with towel 5 x 20" each Figure 4 stretch with towel 5 x 20" each  Sitting: Piriformis stretch 5 x 20" Hamstring stretch with strap 5 x 10"  Standing: Hip vectors 5 x 3" hold each Scapular retractions 2 x 10 RTB Shoulder extensions 2 x 10 RTB Slant board 5 x 20"    06/05/21 Prone:  lying 1 minute  POE 1'  Press ups 2x5 reps   Heelsqueezes 10X5" Sidelying: clams with stabilization 10X5" holds each LE Supine: Bridge 10X5"  SLR 10X each  Hamstring stretch 2X30" ea with towel Seated piriformis stretch 2X30" each  06/03/22 Prone: Prone lying x 1' ( pain down to 2 and down to  left posterior thigh) Prone on elbows x 1 Prone press ups 2 x 5  Supine:   PATIENT EDUCATION:  Education details: HEP Person educated: Patient Education method: Consulting civil engineer, Verbal cues, and Handouts Education comprehension: verbalized understanding and returned demonstration  HOME EXERCISE PROGRAM: 06/24/2022 - Heel Raises with Counter Support  - 1 x daily - 7 x weekly - 1 sets - 10 reps - 5" hold - Mini Squat with Counter Support  - 1 x  daily - 7 x weekly - 1 sets - 10 reps - 5" hold - Sit to Stand  - 1 x daily - 7 x weekly - 1 sets - 10 reps 06/19/22 hamstring stretch, figure 4 stretch 06/03/2022 seated scapular retractions, standing lumbar extensions 05/28/22 prone on elbows, prone press ups, bridge with ball, bridge with belt Access Code: [email or web address] URL: [email or web address] Date: 05/20/2022 Prepared by: Rayetta Humphrey  Exercises - Supine Transversus Abdominis Bracing - Hands on Stomach  - 2 x daily - 7 x weekly - 1 sets - 10 reps - 5" hold - Supine Bridge  - 2 x daily - 7 x weekly - 1 sets - 10 reps - 5-10" hold - Supine Single Knee to Chest Stretch  - 2 x daily - 7 x weekly - 1 sets - 3 reps - 30" hold  ASSESSMENT:  CLINICAL IMPRESSION: Today's session continued to focus on core and lower extremity strengthening; needs reminders for technique with rows.   Overall patient is tolerating increased activity well.  She has no complaint of increased pain with exercise today but still needs some cueing for form and technique.  Pt will benefit from continued skilled therapy services to address deficits and promote return to optimal function.      OBJECTIVE IMPAIRMENTS: decreased activity tolerance, decreased balance, difficulty walking, decreased strength, and pain.   ACTIVITY LIMITATIONS: carrying, lifting, standing, stairs, and locomotion level  PARTICIPATION LIMITATIONS: meal prep, cleaning, shopping, and community activity  PERSONAL FACTORS: Age and 1  comorbidity: recent dx of breast cancer  are also affecting patient's functional outcome.   REHAB POTENTIAL: Good  CLINICAL DECISION MAKING: Stable/uncomplicated  EVALUATION COMPLEXITY: Moderate   GOALS: Goals reviewed with patient? Yes  SHORT TERM GOALS: Target date: 06/11/21   PT to be I in HEP in order to decrease pain to no greater than a 3/10 Baseline: Goal status: IN PROGRESS  2.  PT core and LE strength to be increased 1/2 grade to allow pt to be able to get up from a low lying couch easier Baseline:  Goal status: IN PROGRESS  3.  PT to be able to stand/walk for   30  minutes in order to complete meal prep.  Baseline:  Goal status: IN PROGRESS  4.  Pt pain to go no further than her knee area to demonstrate decreased nerve irritation  Baseline:  Goal status: IN PROGRESS  5.  Pt to understand the importance of using proper body mechanics for bed mobility and lifting  Baseline:  Goal status: IN PROGRESS  LONG TERM GOALS: Target date: 07/01/22  PT to be I in HEP in order to decrease pain to no greater than a 1/10 Baseline:  Goal status: IN PROGRESS  2.  PT core and LE strength to be increased 1 grade to allow pt to be able to get up and down her steps at home easier  Baseline:  Goal status: IN PROGRESS  3.  PT to be able to stand/walk for 60  minutes in order to be able to go shopping with her family .  Baseline:  Goal status: IN PROGRESS  4.  Pt pain to only radiate to the buttock area  Baseline:  Goal status: IN PROGRESS  PLAN:  PT FREQUENCY: 2x/week  PT DURATION: 6 weeks  PLANNED INTERVENTIONS: Therapeutic exercises, Therapeutic activity, Neuromuscular re-education, Balance training, Patient/Family education, Self Care, and Manual therapy.  PLAN FOR NEXT SESSION: Progress core and LE strengthening.  11:14 AM, 06/26/22 Zyann Mabry  Small Markiyah Gahm MPT [facility] [place] [phone]

## 2022-06-30 DIAGNOSIS — I10 Essential (primary) hypertension: Secondary | ICD-10-CM | POA: Diagnosis not present

## 2022-06-30 DIAGNOSIS — J218 Acute bronchiolitis due to other specified organisms: Secondary | ICD-10-CM | POA: Diagnosis not present

## 2022-06-30 DIAGNOSIS — E1169 Type 2 diabetes mellitus with other specified complication: Secondary | ICD-10-CM | POA: Diagnosis not present

## 2022-07-01 ENCOUNTER — Ambulatory Visit (HOSPITAL_COMMUNITY): Payer: PPO

## 2022-07-01 DIAGNOSIS — E1165 Type 2 diabetes mellitus with hyperglycemia: Secondary | ICD-10-CM | POA: Diagnosis not present

## 2022-07-01 DIAGNOSIS — M5416 Radiculopathy, lumbar region: Secondary | ICD-10-CM

## 2022-07-01 DIAGNOSIS — R293 Abnormal posture: Secondary | ICD-10-CM

## 2022-07-01 DIAGNOSIS — M6281 Muscle weakness (generalized): Secondary | ICD-10-CM

## 2022-07-01 DIAGNOSIS — I10 Essential (primary) hypertension: Secondary | ICD-10-CM | POA: Diagnosis not present

## 2022-07-01 NOTE — Therapy (Signed)
OUTPATIENT PHYSICAL THERAPY PROGRESS NOTE/ DISCHARGE PHYSICAL THERAPY DISCHARGE SUMMARY  Visits from Start of Care: 10  Current functional level related to goals / functional outcomes: See below   Remaining deficits: See below   Education / Equipment: See below   Patient agrees to discharge. Patient goals were partially met. Patient is being discharged due to maximized rehab potential.     Progress Note Reporting Period 05/20/2022 to 07/01/2022  See note below for Objective Data and Assessment of Progress/Goals.      Patient Name: Nancy Liu MRN: [id number] DOB:July 18, 1940, 82 y.o., female Today's Date: 07/01/2022   END OF SESSION:  END OF SESSION:   PT End of Session - 07/01/22 0948     Visit Number 10    Number of Visits 12    Date for PT Re-Evaluation 07/01/22    Authorization Type [employer]    PT Start Time 0946    PT Stop Time 1026    PT Time Calculation (min) 40 min    Activity Tolerance Patient tolerated treatment well    Behavior During Therapy [facility] for tasks assessed/performed              Past Medical History:  Diagnosis Date   Arteriosclerotic cardiovascular disease (ASCVD) 2001   Bare-metal stent placed in the right coronary artery in 12/01; residual 50% lesion of the first diagonal and mid LAD   Breast cancer ([place])    Cerebrovascular disease    COPD (chronic obstructive pulmonary disease) ([place])    Cyst, dermoid, arm, left 03/26/2018   Diabetes mellitus    excellent control with a low-dose of a single oral agent   Family history of breast cancer 01/23/2021   Family history of ovarian cancer 01/23/2021   GERD (gastroesophageal reflux disease)    with ulcers   History of right coronary artery stent placement 2000   Hyperlipidemia    Hypertension    Sleep apnea    Thalassemia minor    Tobacco abuse, in remission    35 pack years; Quit in 1980   Past Surgical History:  Procedure Laterality Date   BIOPSY  02/28/2020    Procedure: BIOPSY;  Surgeon: Eloise Harman, DO;  [facility];  Service: Endoscopy;;   COLONOSCOPY  2006   Dr. Collene Mares: normal   COLONOSCOPY  2000   Dr. Everlean Cherry: normal    COLONOSCOPY N/A 03/03/2016   Dr. Oneida Alar: diverticulosis, non-bleeding hemorrhoids, redundant left colon.   DILATION AND CURETTAGE OF UTERUS     ESOPHAGOGASTRODUODENOSCOPY  2000   Dr. Everlean Cherry: gastritis    ESOPHAGOGASTRODUODENOSCOPY  2006   Dr. Collene Mares: small hiatal hernia, gastritis, negative H.pylori    ESOPHAGOGASTRODUODENOSCOPY N/A 03/03/2016   Procedure: ESOPHAGOGASTRODUODENOSCOPY (EGD);  Surgeon: Danie Binder, MD;  [facility];  Service: Endoscopy;  Laterality: N/A;   ESOPHAGOGASTRODUODENOSCOPY N/A 06/03/2016   Dr. Oneida Alar: nonaggressive gastritis due to aspirin use. Previous ulcers had healed.   ESOPHAGOGASTRODUODENOSCOPY (EGD) WITH PROPOFOL N/A 02/28/2020   focal inflammation in the gastric antrum consistent with gastritis on biopsies. No H.pylori.    EXCISION OF KELOID Left 03/26/2018   Procedure: EXCISION OF LEFT ARM MASS;  Surgeon: Fanny Skates, MD;  Location: [place];  Service: General;  Laterality: Left;   GIVENS CAPSULE STUDY N/A 10/10/2020   Procedure: GIVENS CAPSULE STUDY;  Surgeon: Eloise Harman, DO;  [facility];  Service: Endoscopy;  Laterality: N/A;  7:30am   INGUINAL HERNIA REPAIR  1970s  Right   TOTAL MASTECTOMY Right 02/20/2021   Procedure: RIGHT TOTAL MASTECTOMY;  Surgeon: Rolm Bookbinder, MD;  Location: [place];  Service: General;  Laterality: Right;   VAGINAL HYSTERECTOMY  1980   Unilateral oophorectomy   Patient Active Problem List   Diagnosis Date Noted   Snoring 04/18/2022   GERD (gastroesophageal reflux disease) 03/05/2021   S/P mastectomy, right 02/20/2021   Genetic testing 02/12/2021   Family history of breast cancer 01/23/2021   Family history of ovarian cancer 01/23/2021   Ductal carcinoma in situ (DCIS) of right breast  01/17/2021   Non-insulin dependent type 2 diabetes mellitus ([place]) 03/26/2020   Loss of weight 07/13/2019   Constipation 02/10/2017   AVM (arteriovenous malformation) of small bowel, acquired 02/10/2017   Iron deficiency anemia due to chronic blood loss 07/23/2016   Weight loss 04/01/2012   Chest pain on exertion 02/07/2011   Cerebrovascular disease 01/31/2010   CAD S/P percutaneous coronary angioplasty 01/03/2009   Dyslipidemia, goal LDL below 70 10/22/2007   Essential hypertension 10/22/2007    PCP: Lucianne Lei   REFERRING PROVIDER: Lucianne Lei  REFERRING DIAG: PT eval/tx for LBP  Rationale for Evaluation and Treatment: Rehabilitation  THERAPY DIAG:   LBP Lumbar radiculopathy Muscle weakness  ONSET DATE: 10/31/2021                                                                                                                                                                                  SUBJECTIVE STATEMENT: still has a dry cough but otherwise feeling ok.  "About 60%" better" per her report; 4-5/10 pain   PERTINENT HISTORY:  Hx of recently dx  breast cancer, DM, HTN  PAIN:  Are you having pain? Yes: NPRS scale: 0/10 today;  worst 2/10 Pain location: Low back into legs  Pain description: aching  Aggravating factors: wt bearing  Relieving factors: rest   PRECAUTIONS: recently dx breast cancer with recent surgery  no prone at this time   WEIGHT BEARING RESTRICTIONS: No  FALLS:  Has patient fallen in last 6 months? Yes. Number of falls 1  LIVING ENVIRONMENT: Lives with: lives with their family Lives in: House/apartment Stairs: Yes: Internal: 16 steps; on right going up Has following equipment at home: None  OCCUPATION: retired   PLOF: Independent  PATIENT GOALS: To be able to walk and stand longer, no pain in her legs.    NEXT MD VISIT:   OBJECTIVE:   PATIENT SURVEYS:  FOTO 54    LUMBAR ROM:   AROM eval 07/01/22  Flexion Fingers to toes going down  causes increased pain  Fingers to toes  Extension 25; reps increase pain. ~25% available; most  painful  Right lateral flexion    Left lateral flexion    Right rotation    Left rotation     (Blank rows = not tested)  LOWER EXTREMITY MMT:    MMT Right eval Left eval Right 05/28/22 Left 05/28/22 Right 07/01/22 Left 07/01/22  Hip flexion 5/5 3/'5  4 5 '$ 4+  Hip extension 2/5 2/5 3+ '3 4 3  '$ Hip abduction 3+/5  3/5      Hip adduction        Hip internal rotation        Hip external rotation        Knee flexion 5/5 5/'5   5 5  '$ Knee extension 5/5 4/5   5 4+  Ankle dorsiflexion 5/5 5/5      Ankle plantarflexion        Ankle inversion        Ankle eversion         (Blank rows = not tested)  FUNCTIONAL TESTS:  30 seconds chair stand test:  9 reps  2 minute walk test: 452 ft    TODAY'S TREATMENT:                                                                                                                              DATE:  07/01/2022 Progress note FOTO 52 30 sec sit to stand 8 reps 2 MWT 449 ft MMTs and ROM see above  Supine: LTR x 15 Bridge 5" hold x 10 SKTC x 10  Standing: Heel/toe raises 2 x 10 Slant board 5 x 20"    06/26/2022 Supine: LTR x 15 SKTC 10" x 5 each Bridge x 10  Standing: Heel/toe x 20 Squat to chair to target 2 x 10 Shoulder retractions RTB 2 x 10 Shoulder extensions RTB 2 x 10 Hip vectors 2" hold x 8 each leg Wall angels x 10 Wall push ups x 10 Slant board 5 x 20" Hamstring stretch 5 x 20"  Sitting: Piriformis stretch 5 x 20" each   06/24/2022 Standing: Theraband exercises: Scapular retraction x 10 Rows x 10 Shoulder extension x 10 Hip excursions x 3 Heel raises x 10  Functional squat x 10  Side step x 10  Sitting : Sit to stand: x 10 Piriformis stretch x 3 B  Supine: Knee to chest x 3 B  Bridge x 15   06/19/22 Supine: LTR x 15 Bridge 2 x 10 Hamstring stretch with towel 5 x 20" Figure 4 stretch with towel 5 x  20"  Prone: Glute sets 5" hold x 10 Hip extension x 10 each  Sitting: Piriformis stretch 5 x 20" each  Standing: RTB scapular retractions 2 x 10 RTB shoulder extensions 2 x 10    06/17/22 Supine: LTR x 10 Bridge x 10 Active Hamstring stretch with towel 5 x 20" each Figure 4 stretch with towel 5 x 20" each  Sitting: Piriformis stretch 5 x 20" Hamstring  stretch with strap 5 x 10"  Standing: Hip vectors 5 x 3" hold each Scapular retractions 2 x 10 RTB Shoulder extensions 2 x 10 RTB Slant board 5 x 20"    06/05/21 Prone:  lying 1 minute  POE 1'  Press ups 2x5 reps   Heelsqueezes 10X5" Sidelying: clams with stabilization 10X5" holds each LE Supine: Bridge 10X5"  SLR 10X each  Hamstring stretch 2X30" ea with towel Seated piriformis stretch 2X30" each  06/03/22 Prone: Prone lying x 1' ( pain down to 2 and down to left posterior thigh) Prone on elbows x 1 Prone press ups 2 x 5  Supine:   PATIENT EDUCATION:  Education details: HEP Person educated: Patient Education method: Explanation, Verbal cues, and Handouts Education comprehension: verbalized understanding and returned demonstration  HOME EXERCISE PROGRAM: 06/24/2022 - Heel Raises with Counter Support  - 1 x daily - 7 x weekly - 1 sets - 10 reps - 5" hold - Mini Squat with Counter Support  - 1 x daily - 7 x weekly - 1 sets - 10 reps - 5" hold - Sit to Stand  - 1 x daily - 7 x weekly - 1 sets - 10 reps 06/19/22 hamstring stretch, figure 4 stretch 06/03/2022 seated scapular retractions, standing lumbar extensions 05/28/22 prone on elbows, prone press ups, bridge with ball, bridge with belt Access Code: [email or web address] URL: [email or web address] Date: 05/20/2022 Prepared by: Rayetta Humphrey  Exercises - Supine Transversus Abdominis Bracing - Hands on Stomach  - 2 x daily - 7 x weekly - 1 sets - 10 reps - 5" hold - Supine Bridge  - 2 x daily - 7 x weekly - 1 sets - 10 reps - 5-10" hold - Supine  Single Knee to Chest Stretch  - 2 x daily - 7 x weekly - 1 sets - 3 reps - 30" hold  ASSESSMENT:  CLINICAL IMPRESSION: Progress note today.   Minimal change in FOTO score and 30 sec sit to stand test.  Good progress with leg strength but minimal change in lumbar mobility and pain levels.  Progress plateau so patient agreeable to discharge at this time   OBJECTIVE IMPAIRMENTS: decreased activity tolerance, decreased balance, difficulty walking, decreased strength, and pain.   ACTIVITY LIMITATIONS: carrying, lifting, standing, stairs, and locomotion level  PARTICIPATION LIMITATIONS: meal prep, cleaning, shopping, and community activity  PERSONAL FACTORS: Age and 1 comorbidity: recent dx of breast cancer  are also affecting patient's functional outcome.   REHAB POTENTIAL: Good  CLINICAL DECISION MAKING: Stable/uncomplicated  EVALUATION COMPLEXITY: Moderate   GOALS: Goals reviewed with patient? Yes  SHORT TERM GOALS: Target date: 06/11/21   PT to be I in HEP in order to decrease pain to no greater than a 3/10 Baseline: Goal status: IN PROGRESS  2.  PT core and LE strength to be increased 1/2 grade to allow pt to be able to get up from a low lying couch easier Baseline: see above Goal status: MET  3.  PT to be able to stand/walk for   30  minutes in order to complete meal prep.  Baseline:  Goal status: MET  4.  Pt pain to go no further than her knee area to demonstrate decreased nerve irritation  Baseline: still radiates Goal status: IN PROGRESS  5.  Pt to understand the importance of using proper body mechanics for bed mobility and lifting  Baseline:  Goal status: MET  LONG TERM GOALS: Target date: 07/01/22  PT to be I  in HEP in order to decrease pain to no greater than a 1/10 Baseline:  Goal status: IN PROGRESS  2.  PT core and LE strength to be increased 1 grade to allow pt to be able to get up and down her steps at home easier  Baseline:  Goal status: MET  3.   PT to be able to stand/walk for 60  minutes in order to be able to go shopping with her family .  Baseline:  Goal status: IN PROGRESS  4.  Pt pain to only radiate to the buttock area  Baseline:  Goal status: IN PROGRESS  PLAN:  PT FREQUENCY: 2x/week  PT DURATION: 6 weeks  PLANNED INTERVENTIONS: Therapeutic exercises, Therapeutic activity, Neuromuscular re-education, Balance training, Patient/Family education, Self Care, and Manual therapy.  PLAN FOR NEXT SESSION: Progress core and LE strengthening.  10:25 AM, 07/01/22 Butch Otterson Small Rosario Kushner MPT [facility] [place] [phone]

## 2022-07-03 ENCOUNTER — Encounter (HOSPITAL_COMMUNITY): Payer: PPO | Admitting: Physical Therapy

## 2022-07-03 ENCOUNTER — Ambulatory Visit (INDEPENDENT_AMBULATORY_CARE_PROVIDER_SITE_OTHER): Payer: PPO | Admitting: Internal Medicine

## 2022-07-03 ENCOUNTER — Encounter: Payer: Self-pay | Admitting: Internal Medicine

## 2022-07-03 VITALS — BP 142/84 | HR 80 | Temp 98.2°F | Ht 62.0 in | Wt 146.4 lb

## 2022-07-03 DIAGNOSIS — R0609 Other forms of dyspnea: Secondary | ICD-10-CM | POA: Diagnosis not present

## 2022-07-03 DIAGNOSIS — R053 Chronic cough: Secondary | ICD-10-CM | POA: Diagnosis not present

## 2022-07-03 MED ORDER — METHYLPREDNISOLONE ACETATE 80 MG/ML IJ SUSP
120.0000 mg | Freq: Once | INTRAMUSCULAR | Status: AC
  Administered 2022-07-03: 120 mg via INTRAMUSCULAR

## 2022-07-03 NOTE — Patient Instructions (Addendum)
Continue prevacid 30 mg Take 30- 60 min before your first and last meals of the day   GERD (REFLUX)  is an extremely common cause of respiratory symptoms just like yours , many times with no obvious heartburn at all.    It can be treated with medication, but also with lifestyle changes including elevation of the head of your bed (ideally with 6-8inch blocks (or bed risers) under the headboard of your bed),  Smoking cessation, avoidance of late meals, excessive alcohol, and avoid fatty foods, chocolate, peppermint, colas, red wine, and acidic juices such as orange juice.  NO MINT OR MENTHOL PRODUCTS SO NO COUGH DROPS  USE SUGARLESS CANDY INSTEAD (Jolley ranchers or Stover's or Life Savers) or even ice chips will also do - the key is to swallow to prevent all throat clearing. NO OIL BASED VITAMINS - use powdered substitutes.  Stop fish oil when coughing.   For drainage / throat tickle try take CHLORPHENIRAMINE  4 mg  ("Allergy Relief" '4mg'$   at [facility] should be easiest to find in the blue box usually on bottom shelf)  take one every 4 hours as needed - extremely effective and inexpensive over the counter- may cause drowsiness so start with just a dose or two an hour before bedtime and see how you tolerate it before trying in daytime.   Depomerdrol 120 mg IM today   For cough > mucinex dm 1200 mg every 12 hours as needed   Return to this clinic if not better with all medications with you in 2 weeks

## 2022-07-03 NOTE — Assessment & Plan Note (Addendum)
Onset 2008/worse since summer 2023 with bronchiectasis by   Chest  HRCT  10/20/19  - 07/03/2022 try max rx for gerd and pnds with 1st gen H1 blockers per guidelines   - Allergy screen 07/03/2022 >  Eos 0.3 /  IgE  pending   This cough is not typical of bronchiectasis (non productive at hs vs with bronchiectasis productive mostly in am) and does not occur on insp or exp c/w Upper airway cough syndrome (previously labeled PNDS),  is so named because it's frequently impossible to sort out how much is  CR/sinusitis with freq throat clearing (which can be related to primary GERD)   vs  causing  secondary (" extra esophageal")  GERD from wide swings in gastric pressure that occur with throat clearing, often  promoting self use of mint and menthol lozenges that reduce the lower esophageal sphincter tone and exacerbate the problem further in a cyclical fashion.     These are the same pts (now being labeled as having "irritable larynx syndrome" by some cough centers) who not infrequently have a history of having failed to tolerate ace inhibitors,  dry powder inhalers or biphosphonates or report having atypical/extraesophageal reflux symptoms that don't respond to standard doses of PPI  and are easily confused as having aecopd or asthma flares by even experienced allergists/ pulmonologists (myself included).   Rec  Max gerd rx and 1st gen H1 blockers per guidelines  >>> also so added  depomedrol 120 mg IM in case of component of Th-2 driven upper or lower airways inflammation (if cough responds short term only to relapse before return while will on full rx for uacs (as above), then  that would point to allergic rhinitis/ asthma or eos bronchitis as alternative dx)   >>  hard rock candy/ mucinex dm 1200 mg bid prn and f/u in 2 weeks if not better with all meds in hand using a trust but verify approach to confirm accurate Medication  Reconciliation The principal here is that until we are certain that the  patients are  doing what we've asked, it makes no sense to ask them to do more.

## 2022-07-03 NOTE — Assessment & Plan Note (Addendum)
Onset flare since summer 2023 with lots of cough esp hs in pt with known bronchiectasis - 07/03/2022   Walked on RA  x  3  lap(s) =  approx 450  ft  @ mod pace, stopped due to end of study s sob  with lowest 02 sats 95%   Symptoms are markedly disproportionate to objective findings and not clear to what extent this is actually a pulmonary  problem but pt does appear to have difficult to sort out respiratory symptoms of unknown origin for which  DDX  = almost all start with A and  include Adherence, Ace Inhibitors, Acid Reflux, Active Sinus Disease, Alpha 1 Antitripsin deficiency, Anxiety masquerading as Airways dz,  ABPA,  Allergy(esp in young), Aspiration (esp in elderly), Adverse effects of meds,  Active smoking or Vaping, A bunch of PE's/clot burden (a few small clots can't cause this syndrome unless there is already severe underlying pulm or vascular dz with poor reserve),  Anemia or thyroid disorder, plus two Bs  = Bronchiectasis and Beta blocker use..and one C= CHF    Adherence is always the initial "prime suspect" and is a multilayered concern that requires a "trust but verify" approach in every patient - starting with knowing how to use medications, especially inhalers, correctly, keeping up with refills and understanding the fundamental difference between maintenance and prns vs those medications only taken for a very short course and then stopped and not refilled.  - if not better rec return with all meds in hand using a trust but verify approach to confirm accurate Medication  Reconciliation The principal here is that until we are certain that the  patients are doing what we've asked, it makes no sense to ask them to do more.   ? Allergy/ asthma > depomerol 120 mg IM/   Eos 0.3 check IgE w/a   ? Active sinus dz/ rhinitis > hs  1st gen H1 blockers per guidelines    ? Anxiety/depression/ decondtioning >  usually at the bottom of this list of usual suspects but  note already on psychotropics and  may interfere with adherence and also interpretation of response or lack thereof to symptom management which can be quite subjective.   ? Anemia / thyroid dz > ruled out by today's labs   ? BB usually not an issue with such low reported doses of coreg   ? Bronchiectasis > doubt causing doe     ? Chf>  bnp only 22.1 so very unlikely    Each maintenance medication was reviewed in detail including emphasizing most importantly the difference between maintenance and prns and under what circumstances the prns are to be triggered using an action plan format where appropriate.  Total time for H and P, chart review, counseling,   , directly observing portions of ambulatory 02 saturation study/ and generating customized AVS unique to this office visit / same day charting = >  40 min with pt new to me since last ov 2009

## 2022-07-03 NOTE — Progress Notes (Signed)
KREE ARMATO, female    DOB: 06-Aug-1940    MRN: [id number]   Brief patient profile:  4 yobf quit smoking 1989   self referred to pulmonary clinic in [place]  07/03/2022  for refractory cough flared summer 2023 but onset per record was really 2008   Has seen Dr Halford Chessman with working dx of bronchiectasis by HRCT  10/20/19    History of Present Illness  07/03/2022  Pulmonary/ 1st office eval/ Renate Danh / [facility]  Chief Complaint  Patient presents with   Acute Visit    Ongoing "bad cough"  Patient states no improvement and lately the cough causes her nose to run. Neg covid test   Dyspnea:  also onset with cough/ esp steps now much more difficult  Cough: urge to clear throat during the day / worse immediately at hs/ assoc with watery pnds Sleep: nightly  SABA use: don't help 02: none   No obvious day to day or daytime pattern/variability or assoc excess/ purulent sputum or mucus plugs or hemoptysis or cp or chest tightness, subjective wheeze or overt  hb symptoms.    Also denies any obvious fluctuation of symptoms with weather or environmental changes or other aggravating or alleviating factors except as outlined above   No unusual exposure hx or h/o childhood pna/ asthma or knowledge of premature birth.  Current Allergies, Complete Past Medical History, Past Surgical History, Family History, and Social History were reviewed in [employer] record.  ROS  The following are not active complaints unless bolded Hoarseness, sore throat, dysphagia, dental problems, itching, sneezing,  nasal congestion or discharge of excess mucus or purulent secretions, ear ache,   fever, chills, sweats, unintended wt loss or wt gain, classically pleuritic or exertional cp,  orthopnea pnd or arm/hand swelling  or leg swelling, presyncope, palpitations, abdominal pain, anorexia, nausea, vomiting, diarrhea  or change in bowel habits or change in bladder habits, change in stools or  change in urine, dysuria, hematuria,  rash, arthralgias, visual complaints, headache, numbness, weakness or ataxia or problems with walking or coordination,  change in mood or  memory.             Past Medical History:  Diagnosis Date   Arteriosclerotic cardiovascular disease (ASCVD) 2001   Bare-metal stent placed in the right coronary artery in 12/01; residual 50% lesion of the first diagonal and mid LAD   Breast cancer ([place])    Cerebrovascular disease    COPD (chronic obstructive pulmonary disease) (HCC)    Cyst, dermoid, arm, left 03/26/2018   Diabetes mellitus    excellent control with a low-dose of a single oral agent   Family history of breast cancer 01/23/2021   Family history of ovarian cancer 01/23/2021   GERD (gastroesophageal reflux disease)    with ulcers   History of right coronary artery stent placement 2000   Hyperlipidemia    Hypertension    Sleep apnea    Thalassemia minor    Tobacco abuse, in remission    35 pack years; Quit in 1980    Outpatient Medications Prior to Visit  Medication Sig Dispense Refill   acetaminophen (TYLENOL) 500 MG tablet Take 1,000 mg by mouth every 6 (six) hours as needed for moderate pain or headache.     albuterol (VENTOLIN HFA) 108 (90 Base) MCG/ACT inhaler Inhale 2 puffs into the lungs every 6 (six) hours as needed for wheezing or shortness of breath. 8 g 2   amLODipine (NORVASC) 10  MG tablet TAKE ONE TABLET BY MOUTH EVERYDAY AT BEDTIME 90 tablet 3   aspirin EC 81 MG tablet Take 81 mg by mouth daily. Swallow whole.     carvedilol (COREG) 6.25 MG tablet TAKE ONE TABLET BY MOUTH BEFORE BREAKFAST and TAKE ONE TABLET BY MOUTH EVERYDAY AT BEDTIME 180 tablet 3   Cholecalciferol (VITAMIN D3) 50 MCG (2000 UT) TABS TAKE ONE TABLET BY MOUTH daily in addition TO THE 1,000unit FOR total of 3,000units daily 30 tablet 1   Cholecalciferol 25 MCG (1000 UT) tablet TAKE ONE TABLET BY MOUTH daily along with THE 2,000unit FOR A total of 3,000units daily  30 tablet 1   citalopram (CELEXA) 10 MG tablet Take by mouth.     doxepin (SINEQUAN) 10 MG capsule Take 10 mg by mouth at bedtime as needed.     irbesartan-hydrochlorothiazide (AVALIDE) 300-12.5 MG tablet Take 1 tablet by mouth daily before breakfast.     lansoprazole (PREVACID) 30 MG capsule Take 30 mg daily before breakfast and you can take additional dose '30mg'$  before evening meal if needed. 180 capsule 3   metFORMIN (GLUCOPHAGE) 500 MG tablet Take 500 mg by mouth at bedtime.     NOREL AD 4-10-325 MG TABS Take 1 tablet by mouth 4 (four) times daily.     Omega-3 Fatty Acids (FISH OIL) 1000 MG CAPS Take 2 capsules by mouth daily.     polyethylene glycol powder (GLYCOLAX/MIRALAX) 17 GM/SCOOP powder Take 1/2 capful daily as needed. Make sure to take a dose if you go more than 48 hours without a bowel movement. 255 g 3   rosuvastatin (CRESTOR) 20 MG tablet TAKE ONE TABLET BY MOUTH EVERYDAY AT BEDTIME 90 tablet 3   triamcinolone ointment (KENALOG) 0.1 % Apply topically 2 (two) times daily as needed.     triamcinolone ointment (KENALOG) 0.5 % Apply 1 application  topically 2 (two) times daily as needed (rash).     Turmeric 500 MG CAPS Take 500 mg by mouth daily.     Vitamin D, Cholecalciferol, 25 MCG (1000 UT) TABS SMARTSIG:By Mouth     No facility-administered medications prior to visit.     Objective:     BP (!) 142/84   Pulse 80   Temp 98.2 F (36.8 C)   Ht '5\' 2"'$  (1.575 m)   Wt 146 lb 6.4 oz (66.4 kg)   SpO2 97% Comment: ra  BMI 26.78 kg/m   SpO2: 97 % (ra)  Amb slt hoarse elderly bf nad   HEENT : Oropharynx  clear/ no obvious pnds or cobblestoning     Nasal turbinates mild non-specific turbinate edema    NECK :  without  apparent JVD/ palpable Nodes/TM    LUNGS: no acc muscle use,  Nl contour chest which is clear to A and P bilaterally without cough on insp or exp maneuvers   CV:  RRR  no s3   2/6 SEM  s  increase in P2, and no edema   ABD:  soft and nontender with nl  inspiratory excursion in the supine position. No bruits or organomegaly appreciated   MS:  Nl gait/ ext warm without deformities Or obvious joint restrictions  calf tenderness, cyanosis or clubbing    SKIN: warm and dry without lesions    NEURO:  alert, approp, nl sensorium with  no motor or cerebellar deficits apparent.     I personally reviewed images and agree with radiology impression as follows:   Chest  HRCT  10/20/19  1. Redemonstrated tubular bronchiectasis of the bilateral lower lungs, worst in the medial right lung base with some associated irregular interstitial opacity. There is additional bandlike scarring or atelectasis of the left lower lobe and lingula. Findings are most consistent with bland post infectious or inflammatory scarring, perhaps due to aspiration. There is no significant interval change. 2. Assessment for air trapping is limited by very inspiratory phase of acquired expiratory sequence. 3. Occasional small pulmonary nodules, including 9 mm nodule of the lingula, stable compared to prior examination and definitively benign. 4. Coronary artery disease.  Aortic Atherosclerosis (ICD10-I70.0).   CXR PA and Lateral:   07/03/2022 :    I personally reviewed images and impression is as follows:     Did not go to for cxr as rec   Labs ordered/ reviewed:      Chemistry      Component Value Date/Time   NA 141 07/03/2022 1411   K 4.4 07/03/2022 1411   CL 100 07/03/2022 1411   CO2 26 07/03/2022 1411   BUN 20 07/03/2022 1411   CREATININE 1.19 (H) 07/03/2022 1411   CREATININE 1.22 (H) 01/23/2021 0745      Component Value Date/Time   CALCIUM 9.2 07/03/2022 1411   ALKPHOS 65 01/23/2021 0745   AST 24 01/23/2021 0745   ALT 17 01/23/2021 0745   BILITOT 0.6 01/23/2021 0745        Lab Results  Component Value Date   WBC 7.8 07/03/2022   HGB 12.2 07/03/2022   HCT 37.6 07/03/2022   MCV 80 07/03/2022   PLT 282 07/03/2022     No results found for:  "DDIMER"    Lab Results  Component Value Date   TSH 0.759 07/03/2022     BNP    22.1 07/03/2022     Lab Results  Component Value Date   ESRSEDRATE 16 07/03/2022          Assessment   DOE (dyspnea on exertion) Onset flare since summer 2023 with lots of cough esp hs in pt with known bronchiectasis - 07/03/2022   Walked on RA  x  3  lap(s) =  approx 450  ft  @ mod pace, stopped due to end of study s sob  with lowest 02 sats 95%   Symptoms are markedly disproportionate to objective findings and not clear to what extent this is actually a pulmonary  problem but pt does appear to have difficult to sort out respiratory symptoms of unknown origin for which  DDX  = almost all start with A and  include Adherence, Ace Inhibitors, Acid Reflux, Active Sinus Disease, Alpha 1 Antitripsin deficiency, Anxiety masquerading as Airways dz,  ABPA,  Allergy(esp in young), Aspiration (esp in elderly), Adverse effects of meds,  Active smoking or Vaping, A bunch of PE's/clot burden (a few small clots can't cause this syndrome unless there is already severe underlying pulm or vascular dz with poor reserve),  Anemia or thyroid disorder, plus two Bs  = Bronchiectasis and Beta blocker use..and one C= CHF    Adherence is always the initial "prime suspect" and is a multilayered concern that requires a "trust but verify" approach in every patient - starting with knowing how to use medications, especially inhalers, correctly, keeping up with refills and understanding the fundamental difference between maintenance and prns vs those medications only taken for a very short course and then stopped and not refilled.  - if not better rec return with all meds in hand using  a trust but verify approach to confirm accurate Medication  Reconciliation The principal here is that until we are certain that the  patients are doing what we've asked, it makes no sense to ask them to do more.   ? Allergy/ asthma >  Eos 0.3  IgE 20 > see if  responds to depomedrol 120 mg IM transiently at all  ? Active sinus dz/ rhinitis > hs  1st gen H1 blockers per guidelines    ? Anxiety/depression/ decondtioning >  usually at the bottom of this list of usual suspects but  note already on psychotropics and may interfere with adherence and also interpretation of response or lack thereof to symptom management which can be quite subjective.   ? Anemia / thyroid dz > ruled out by today's labs   ? BB usually not an issue with such low reported doses of coreg   ? Bronchiectasis > doubt causing doe     ? Chf> doe have SEM prev echo neg 10/08/21 /  bnp only 22.1 so very unlikely    Each maintenance medication was reviewed in detail including emphasizing most importantly the difference between maintenance and prns and under what circumstances the prns are to be triggered using an action plan format where appropriate.  Total time for H and P, chart review, counseling,   , directly observing portions of ambulatory 02 saturation study/ and generating customized AVS unique to this office visit / same day charting = >  40 min with pt new to me since last ov 2009                Cough Onset 2008/worse since summer 2023 with bronchiectasis by   Chest  HRCT  10/20/19  - 07/03/2022 try max rx for gerd and pnds with 1st gen H1 blockers per guidelines   - Allergy screen 07/03/2022 >  Eos 0.3 /  IgE  20  This cough is not typical of bronchiectasis (non productive at hs vs with bronchiectasis productive mostly in am) and does not occur on insp or exp c/w Upper airway cough syndrome (previously labeled PNDS),  is so named because it's frequently impossible to sort out how much is  CR/sinusitis with freq throat clearing (which can be related to primary GERD)   vs  causing  secondary (" extra esophageal")  GERD from wide swings in gastric pressure that occur with throat clearing, often  promoting self use of mint and menthol lozenges that reduce the lower esophageal  sphincter tone and exacerbate the problem further in a cyclical fashion.     These are the same pts (now being labeled as having "irritable larynx syndrome" by some cough centers) who not infrequently have a history of having failed to tolerate ace inhibitors,  dry powder inhalers or biphosphonates or report having atypical/extraesophageal reflux symptoms that don't respond to standard doses of PPI  and are easily confused as having aecopd or asthma flares by even experienced allergists/ pulmonologists (myself included).   Rec  Max gerd rx and 1st gen H1 blockers per guidelines  >>> also so added  depomedrol 120 mg IM in case of component of Th-2 driven upper or lower airways inflammation (if cough responds short term only to relapse before return while will on full rx for uacs (as above), then  that would point to allergic rhinitis/ asthma or eos bronchitis as alternative dx)   >>  hard rock candy/ mucinex dm 1200 mg bid prn and f/u in 2  weeks if not better with all meds in hand using a trust but verify approach to confirm accurate Medication  Reconciliation The principal here is that until we are certain that the  patients are doing what we've asked, it makes no sense to ask them to do more.      Christinia Gully, MD 07/03/2022

## 2022-07-05 LAB — BASIC METABOLIC PANEL
BUN/Creatinine Ratio: 17 (ref 12–28)
BUN: 20 mg/dL (ref 8–27)
CO2: 26 mmol/L (ref 20–29)
Calcium: 9.2 mg/dL (ref 8.7–10.3)
Chloride: 100 mmol/L (ref 96–106)
Creatinine, Ser: 1.19 mg/dL — ABNORMAL HIGH (ref 0.57–1.00)
Glucose: 86 mg/dL (ref 70–99)
Potassium: 4.4 mmol/L (ref 3.5–5.2)
Sodium: 141 mmol/L (ref 134–144)
eGFR: 46 mL/min/{1.73_m2} — ABNORMAL LOW (ref >59)

## 2022-07-05 LAB — CBC WITH DIFFERENTIAL/PLATELET
Basophils Absolute: 0 10*3/uL (ref 0.0–0.2)
Basos: 0 %
EOS (ABSOLUTE): 0.3 10*3/uL (ref 0.0–0.4)
Eos: 3 %
Hematocrit: 37.6 % (ref 34.0–46.6)
Hemoglobin: 12.2 g/dL (ref 11.1–15.9)
Immature Grans (Abs): 0 10*3/uL (ref 0.0–0.1)
Immature Granulocytes: 0 %
Lymphocytes Absolute: 1.1 10*3/uL (ref 0.7–3.1)
Lymphs: 14 %
MCH: 25.9 pg — ABNORMAL LOW (ref 26.6–33.0)
MCHC: 32.4 g/dL (ref 31.5–35.7)
MCV: 80 fL (ref 79–97)
Monocytes Absolute: 0.6 10*3/uL (ref 0.1–0.9)
Monocytes: 8 %
Neutrophils Absolute: 5.8 10*3/uL (ref 1.4–7.0)
Neutrophils: 75 %
Platelets: 282 10*3/uL (ref 150–450)
RBC: 4.71 x10E6/uL (ref 3.77–5.28)
RDW: 14.1 % (ref 11.7–15.4)
WBC: 7.8 10*3/uL (ref 3.4–10.8)

## 2022-07-05 LAB — BRAIN NATRIURETIC PEPTIDE: BNP: 22.1 pg/mL (ref 0.0–100.0)

## 2022-07-05 LAB — IGE: IgE (Immunoglobulin E), Serum: 20 IU/mL (ref 6–495)

## 2022-07-05 LAB — SEDIMENTATION RATE: Sed Rate: 16 mm/hr (ref 0–40)

## 2022-07-05 LAB — TSH: TSH: 0.759 u[IU]/mL (ref 0.450–4.500)

## 2022-07-15 NOTE — Progress Notes (Unsigned)
GI Office Note    Referring Provider: Lucianne Lei, MD Primary Care Physician:  Lucianne Lei, MD  Primary Gastroenterologist: Elon Alas. Abbey Chatters, DO   Chief Complaint   No chief complaint on file.   History of Present Illness   Nancy Liu is a 82 y.o. female presenting today for follow up. Last seen in 12/2021. She has history of IDA likely due to small bowel AVMS, constipation, and GERD.      Last EGD and colonoscopy October 2017.  EGD revealed nonbleeding gastric ulcers, gastroduodenitis, nonbleeding AVM in second portion of duodenum, biopsy negative for H. pylori. Colonoscopy with diverticulosis and nonbleeding hemorrhoids.  EGD repeated in January 2018 with erosive esophagitis with healed ulcers.  She subsequently reported melena and underwent another EGD in September 2021 with focal inflammation in the gastric antrum consistent with gastritis on biopsy, again negative for H. pylori. To wrap up IDA eval, she complete capsule study completed May 2022 with few gastric and small bowel erosions and possible small bowel AVM without any active bleeding.   CT February 2021 with questionable fullness in the pancreatic head, 6 mm cyst in the pancreatic body. MRCP negative for mass or ductal dilation.     Medications   Current Outpatient Medications  Medication Sig Dispense Refill   acetaminophen (TYLENOL) 500 MG tablet Take 1,000 mg by mouth every 6 (six) hours as needed for moderate pain or headache.     albuterol (VENTOLIN HFA) 108 (90 Base) MCG/ACT inhaler Inhale 2 puffs into the lungs every 6 (six) hours as needed for wheezing or shortness of breath. 8 g 2   amLODipine (NORVASC) 10 MG tablet TAKE ONE TABLET BY MOUTH EVERYDAY AT BEDTIME 90 tablet 3   aspirin EC 81 MG tablet Take 81 mg by mouth daily. Swallow whole.     carvedilol (COREG) 6.25 MG tablet TAKE ONE TABLET BY MOUTH BEFORE BREAKFAST and TAKE ONE TABLET BY MOUTH EVERYDAY AT BEDTIME 180 tablet 3   Cholecalciferol  (VITAMIN D3) 50 MCG (2000 UT) TABS TAKE ONE TABLET BY MOUTH daily in addition TO THE 1,000unit FOR total of 3,000units daily 30 tablet 1   Cholecalciferol 25 MCG (1000 UT) tablet TAKE ONE TABLET BY MOUTH daily along with THE 2,000unit FOR A total of 3,000units daily 30 tablet 1   citalopram (CELEXA) 10 MG tablet Take by mouth.     doxepin (SINEQUAN) 10 MG capsule Take 10 mg by mouth at bedtime as needed.     irbesartan-hydrochlorothiazide (AVALIDE) 300-12.5 MG tablet Take 1 tablet by mouth daily before breakfast.     lansoprazole (PREVACID) 30 MG capsule Take 30 mg daily before breakfast and you can take additional dose 17m before evening meal if needed. 180 capsule 3   metFORMIN (GLUCOPHAGE) 500 MG tablet Take 500 mg by mouth at bedtime.     NOREL AD 4-10-325 MG TABS Take 1 tablet by mouth 4 (four) times daily.     Omega-3 Fatty Acids (FISH OIL) 1000 MG CAPS Take 2 capsules by mouth daily.     polyethylene glycol powder (GLYCOLAX/MIRALAX) 17 GM/SCOOP powder Take 1/2 capful daily as needed. Make sure to take a dose if you go more than 48 hours without a bowel movement. 255 g 3   rosuvastatin (CRESTOR) 20 MG tablet TAKE ONE TABLET BY MOUTH EVERYDAY AT BEDTIME 90 tablet 3   triamcinolone ointment (KENALOG) 0.1 % Apply topically 2 (two) times daily as needed.     triamcinolone ointment (KENALOG)  0.5 % Apply 1 application  topically 2 (two) times daily as needed (rash).     Turmeric 500 MG CAPS Take 500 mg by mouth daily.     Vitamin D, Cholecalciferol, 25 MCG (1000 UT) TABS SMARTSIG:By Mouth     No current facility-administered medications for this visit.    Allergies   Allergies as of 07/16/2022 - Review Complete 07/03/2022  Allergen Reaction Noted   Protonix [pantoprazole sodium]  03/27/2016     Past Medical History   Past Medical History:  Diagnosis Date   Arteriosclerotic cardiovascular disease (ASCVD) 2001   Bare-metal stent placed in the right coronary artery in 12/01; residual 50%  lesion of the first diagonal and mid LAD   Breast cancer ([facility])    Cerebrovascular disease    COPD (chronic obstructive pulmonary disease) (HCC)    Cyst, dermoid, arm, left 03/26/2018   Diabetes mellitus    excellent control with a low-dose of a single oral agent   Family history of breast cancer 01/23/2021   Family history of ovarian cancer 01/23/2021   GERD (gastroesophageal reflux disease)    with ulcers   History of right coronary artery stent placement 2000   Hyperlipidemia    Hypertension    Sleep apnea    Thalassemia minor    Tobacco abuse, in remission    35 pack years; Quit in 1980    Past Surgical History   Past Surgical History:  Procedure Laterality Date   BIOPSY  02/28/2020   Procedure: BIOPSY;  Surgeon: Eloise Harman, DO;  [facility];  Service: Endoscopy;;   COLONOSCOPY  2006   Dr. Collene Mares: normal   COLONOSCOPY  2000   Dr. Everlean Cherry: normal    COLONOSCOPY N/A 03/03/2016   Dr. Oneida Alar: diverticulosis, non-bleeding hemorrhoids, redundant left colon.   DILATION AND CURETTAGE OF UTERUS     ESOPHAGOGASTRODUODENOSCOPY  2000   Dr. Everlean Cherry: gastritis    ESOPHAGOGASTRODUODENOSCOPY  2006   Dr. Collene Mares: small hiatal hernia, gastritis, negative H.pylori    ESOPHAGOGASTRODUODENOSCOPY N/A 03/03/2016   Procedure: ESOPHAGOGASTRODUODENOSCOPY (EGD);  Surgeon: Danie Binder, MD;  [facility];  Service: Endoscopy;  Laterality: N/A;   ESOPHAGOGASTRODUODENOSCOPY N/A 06/03/2016   Dr. Oneida Alar: nonaggressive gastritis due to aspirin use. Previous ulcers had healed.   ESOPHAGOGASTRODUODENOSCOPY (EGD) WITH PROPOFOL N/A 02/28/2020   focal inflammation in the gastric antrum consistent with gastritis on biopsies. No H.pylori.    EXCISION OF KELOID Left 03/26/2018   Procedure: EXCISION OF LEFT ARM MASS;  Surgeon: Fanny Skates, MD;  Location: [place];  Service: General;  Laterality: Left;   GIVENS CAPSULE STUDY N/A 10/10/2020   Procedure: GIVENS CAPSULE STUDY;   Surgeon: Eloise Harman, DO;  [facility];  Service: Endoscopy;  Laterality: N/A;  7:30am   INGUINAL HERNIA REPAIR  1970s   Right   TOTAL MASTECTOMY Right 02/20/2021   Procedure: RIGHT TOTAL MASTECTOMY;  Surgeon: Rolm Bookbinder, MD;  Location: [place];  Service: General;  Laterality: Right;   VAGINAL HYSTERECTOMY  1980   Unilateral oophorectomy    Past Family History   Family History  Problem Relation Age of Onset   Heart disease Mother        also hypertension and asthma   Lung cancer Mother        dx 24s   Ovarian cancer Mother        dx before 56   Diabetes Half-Sister    Breast cancer Half-Sister  28   Multiple myeloma Nephew 53       war-related exposures   Colon cancer Neg Hx    Colon polyps Neg Hx     Past Social History   Social History   Socioeconomic History   Marital status: Divorced    Spouse name: Not on file   Number of children: 3   Years of education: Not on file   Highest education level: Not on file  Occupational History   Occupation: Retired    Comment: [place] work  Tobacco Use   Smoking status: Former    Packs/day: 1.00    Years: 18.00    Total pack years: 18.00    Types: Cigarettes    Start date: 06/02/1965    Quit date: 06/09/1980    Years since quitting: 42.1   Smokeless tobacco: Never  Vaping Use   Vaping Use: Never used  Substance and Sexual Activity   Alcohol use: No    Alcohol/week: 0.0 standard drinks of alcohol   Drug use: No   Sexual activity: Not Currently  Other Topics Concern   Not on file  Social History Narrative   Lives with daughter    Social Determinants of Health   Financial Resource Strain: [place]  (06/04/2021)   Overall Financial Resource Strain (CARDIA)    Difficulty of Paying Living Expenses: Not hard at all  Food Insecurity: No Nancy Liu (01/23/2021)   Hunger Vital Sign    Worried About Running Out of Food in the Last Year: Never true    [place] in the Last Year: Never true   Transportation Needs: No Transportation Needs (01/23/2021)   PRAPARE - Hydrologist (Medical): No    Lack of Transportation (Non-Medical): No  Physical Activity: Inactive (06/04/2021)   Exercise Vital Sign    Days of Exercise per Week: 0 days    Minutes of Exercise per Session: 0 min  Stress: Stress Concern Present (06/04/2021)   [place]    Feeling of Stress : To some extent  Social Connections: Moderately Isolated (06/04/2021)   Social Connection and Isolation Panel [NHANES]    Frequency of Communication with Friends and Family: More than three times a week    Frequency of Social Gatherings with Friends and Family: More than three times a week    Attends Religious Services: More than 4 times per year    Active Member of [employer] or Organizations: No    Attends Archivist Meetings: Never    Marital Status: Divorced  Human resources officer Violence: Not At Risk (06/04/2021)   Humiliation, Afraid, Rape, and Kick questionnaire    Fear of Current or Ex-Partner: No    Emotionally Abused: No    Physically Abused: No    Sexually Abused: No    Review of Systems   General: Negative for anorexia, weight loss, fever, chills, fatigue, weakness. ENT: Negative for hoarseness, difficulty swallowing , nasal congestion. CV: Negative for chest pain, angina, palpitations, dyspnea on exertion, peripheral edema.  Respiratory: Negative for dyspnea at rest, dyspnea on exertion, cough, sputum, wheezing.  GI: See history of present illness. GU:  Negative for dysuria, hematuria, urinary incontinence, urinary frequency, nocturnal urination.  Endo: Negative for unusual weight change.     Physical Exam   There were no vitals taken for this visit.   General: Well-nourished, well-developed in no acute distress.  Eyes: No icterus. Mouth: Oropharyngeal mucosa  moist and pink , no lesions erythema or exudate. Lungs:  Clear to auscultation bilaterally.  Heart: Regular rate and rhythm, no murmurs rubs or gallops.  Abdomen: Bowel sounds are normal, nontender, nondistended, no hepatosplenomegaly or masses,  no abdominal bruits or hernia , no rebound or guarding.  Rectal: ***  Extremities: No lower extremity edema. No clubbing or deformities. Neuro: Alert and oriented x 4   Skin: Warm and dry, no jaundice.   Psych: Alert and cooperative, normal mood and affect.  Labs   *** Imaging Studies   No results found.  Assessment       PLAN   ***   Nancy Liu. Nancy Liu, [place], [facility]

## 2022-07-16 ENCOUNTER — Ambulatory Visit (INDEPENDENT_AMBULATORY_CARE_PROVIDER_SITE_OTHER): Payer: PPO | Admitting: Gastroenterology

## 2022-07-16 ENCOUNTER — Encounter: Payer: Self-pay | Admitting: Gastroenterology

## 2022-07-16 VITALS — BP 134/82 | HR 77 | Temp 97.9°F | Ht 62.5 in | Wt 144.0 lb

## 2022-07-16 DIAGNOSIS — D500 Iron deficiency anemia secondary to blood loss (chronic): Secondary | ICD-10-CM

## 2022-07-16 DIAGNOSIS — K5904 Chronic idiopathic constipation: Secondary | ICD-10-CM | POA: Diagnosis not present

## 2022-07-16 DIAGNOSIS — K5520 Angiodysplasia of colon without hemorrhage: Secondary | ICD-10-CM

## 2022-07-16 DIAGNOSIS — K219 Gastro-esophageal reflux disease without esophagitis: Secondary | ICD-10-CM

## 2022-07-16 NOTE — Patient Instructions (Signed)
Continue lansoprazole as before.  Continue miralax as needed.  Make sure you eat several small meals/snacks daily to get enough calories to maintain your weight.  We will see you back in one year but call sooner if you have any questions or concerns.   It was a pleasure to see you today. I want to create trusting relationships with patients and provide genuine, compassionate, and quality care. I truly value your feedback, so please be on the lookout for a survey regarding your visit with me today. I appreciate your time in completing this!

## 2022-07-17 ENCOUNTER — Ambulatory Visit (INDEPENDENT_AMBULATORY_CARE_PROVIDER_SITE_OTHER): Payer: PPO | Admitting: Internal Medicine

## 2022-07-17 ENCOUNTER — Encounter: Payer: Self-pay | Admitting: Internal Medicine

## 2022-07-17 DIAGNOSIS — R053 Chronic cough: Secondary | ICD-10-CM

## 2022-07-17 MED ORDER — PREDNISONE 10 MG PO TABS: ORAL_TABLET | ORAL | 0 refills | Status: DC

## 2022-07-17 MED ORDER — TRAMADOL HCL 50 MG PO TABS: 50.0000 mg | ORAL_TABLET | ORAL | 0 refills | Status: AC | PRN

## 2022-07-17 NOTE — Assessment & Plan Note (Signed)
Onset 2008/worse since summer 2023 with bronchiectasis by   Chest  HRCT  10/20/19  - 07/03/2022 try max rx for gerd and pnds with 1st gen H1 blockers per guidelines   - Allergy screen 07/03/2022 >  Eos 0.3 /  IgE  29 - cycilcal cough regimen 07/17/2022 >>>   This cough is not typical at all of bronchiectasis but more likely a form of  Upper airway cough syndrome (previously labeled PNDS),  is so named because it's frequently impossible to sort out how much is  CR/sinusitis with freq throat clearing (which can be related to primary GERD)   vs  causing  secondary (" extra esophageal")  GERD from wide swings in gastric pressure that occur with throat clearing, often  promoting self use of mint and menthol lozenges that reduce the lower esophageal sphincter tone and exacerbate the problem further in a cyclical fashion.   These are the same pts (now being labeled as having "irritable larynx syndrome" by some cough centers) who not infrequently have a history of having failed to tolerate ace inhibitors,  dry powder inhalers or biphosphonates or report having atypical/extraesophageal reflux symptoms that don't respond to standard doses of PPI  and are easily confused as having aecopd or asthma flares by even experienced allergists/ pulmonologists (myself included).   Of the three most common causes of  Sub-acute / recurrent or chronic cough, only one (GERD)  can actually contribute to/ trigger  the other two (asthma and post nasal drip syndrome)  and perpetuate the cylce of cough.  While not intuitively obvious, many patients with chronic low grade reflux do not cough until there is a primary insult that disturbs the protective epithelial barrier and exposes sensitive nerve endings.   This is typically viral but can due to PNDS and  either may apply here.   The point is that once this occurs, it is difficult to eliminate the cycle  using anything but a maximally effective acid suppression regimen at least in the short  run, accompanied by an appropriate diet to address non acid GERD and control / eliminate the cough itself for at least 3 days with tramadol >>> also added 6 day taper off  Prednisone starting at 40 mg per day in case of component of Th-2 driven upper or lower airways inflammation (if cough responds short term only to relapse before return while will on full rx for uacs (as above), then  that would point to allergic rhinitis/ asthma or eos bronchitis as alternative dx)   Advised: The standardized cough guidelines published in Chest by Lissa Morales in 2006 are still the best available and consist of a multiple step process (up to 12!) , not a single office visit,  and are intended  to address this problem logically,  with an alogrithm dependent on response to empiric treatment at  each progressive step  to determine a specific diagnosis with  minimal addtional testing needed. Therefore if adherence is an issue or can't be accurately verified,  it's very unlikely the standard evaluation and treatment will be successful here.    Furthermore, response to therapy (other than acute cough suppression, which should only be used short term with avoidance of narcotic containing cough syrups if possible), can be a gradual process for which the patient is not likely to  perceive immediate benefit.  Unlike going to an eye doctor where the best perscription is almost always the first one and is immediately effective, this is almost never  the case in the management of chronic cough syndromes. Therefore the patient needs to commit up front to consistently adhere to recommendations  for up to 6 weeks of therapy directed at the likely underlying problem(s) before the response can be reasonably evaluated.   F//u 4 weeks  with all meds in hand using a trust but verify approach to confirm accurate Medication  Reconciliation The principal here is that until we are certain that the  patients are doing what we've asked, it makes  no sense to ask them to do more.          Each maintenance medication was reviewed in detail including emphasizing most importantly the difference between maintenance and prns and under what circumstances the prns are to be triggered using an action plan format where appropriate.  Total time for H and P, chart review, counseling,  and generating customized AVS unique to this office visit / same day charting  > 30 min for  refractory respiratory  symptoms of uncertain etiology

## 2022-07-17 NOTE — Progress Notes (Signed)
Nancy Liu, female    DOB: September 05, 1940    MRN: [id number]   Brief patient profile:  65 yobf quit smoking 1989   self referred to pulmonary clinic in [place]  07/03/2022  for refractory cough flared summer 2023 but onset per record was really 2008   Has seen Dr Halford Chessman with working dx of bronchiectasis by HRCT  10/20/19    History of Present Illness  07/03/2022  Pulmonary/ 1st office eval/ Teegan Guinther / [place] [facility]  Chief Complaint  Patient presents with   Acute Visit    Ongoing "bad cough"  Patient states no improvement and lately the cough causes her nose to run. Neg covid test   Dyspnea:  also onset with cough/ esp steps now much more difficult  Cough: urge to clear throat during the day / worse immediately at hs/ assoc with watery pnds no better on H1/ assoc hoarseness and throat discomfort  Sleep: nightly  SABA use: don't help 02: none  Rec Continue prevacid 30 mg Take 30- 60 min before your first and last meals of the day  GERD diet reviewed, bed blocks rec  For drainage / throat tickle try take CHLORPHENIRAMINE  4 mg   Depomerdrol 120 mg IM today  For cough > mucinex dm 1200 mg every 12 hours as needed  Return to this clinic if not better with all medications with you in 2 weeks    07/17/2022  f/u ov/[place] office/Raileigh Sabater re: chronic cough  maint on gerd rx/ h1 / tessalon/ depomedrol 120 >>> no better   Chief Complaint  Patient presents with   Follow-up    Coughing a lot. 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     Also denies any obvious fluctuation of symptoms with weather or environmental changes or other aggravating or alleviating factors except as outlined above   No unusual exposure hx or h/o childhood pna/ asthma or knowledge of premature birth.  Current Allergies, Complete Past Medical History, Past Surgical History, Family History, and Social History were reviewed in [employer] record.  ROS  The following are not active complaints unless bolded Hoarseness, sore throat, dysphagia, dental problems, itching, sneezing,  nasal congestion or discharge of excess mucus or purulent secretions, ear ache,   fever, chills, sweats, unintended wt loss or wt gain, classically pleuritic or exertional cp,  orthopnea pnd or arm/hand swelling  or leg swelling, presyncope, palpitations, abdominal pain, anorexia, nausea, vomiting, diarrhea  or change in bowel habits or change in bladder habits, change in stools or change in urine, dysuria, hematuria,  rash, arthralgias, visual complaints, headache, numbness, weakness or ataxia or problems with walking or coordination,  change in mood or  memory.        Current Meds  Medication Sig   acetaminophen (TYLENOL) 500 MG tablet Take 1,000 mg by mouth every  6 (six) hours as needed for moderate pain or headache.   albuterol (VENTOLIN HFA) 108 (90 Base) MCG/ACT inhaler Inhale 2 puffs into the lungs every 6 (six) hours as needed for wheezing or shortness of breath.   amLODipine (NORVASC) 10 MG tablet TAKE ONE TABLET BY MOUTH EVERYDAY AT BEDTIME   aspirin EC 81 MG tablet Take 81 mg by mouth daily. Swallow whole.   carvedilol (COREG) 6.25 MG tablet TAKE ONE TABLET BY MOUTH BEFORE BREAKFAST and TAKE ONE TABLET BY MOUTH EVERYDAY AT BEDTIME   Cholecalciferol (VITAMIN D3) 50 MCG (2000 UT) TABS TAKE ONE TABLET BY MOUTH daily in addition TO THE 1,000unit FOR total of 3,000units daily    Cholecalciferol 25 MCG (1000 UT) tablet TAKE ONE TABLET BY MOUTH daily along with THE 2,000unit FOR A total of 3,000units daily   irbesartan-hydrochlorothiazide (AVALIDE) 300-12.5 MG tablet Take 1 tablet by mouth daily before breakfast.   lansoprazole (PREVACID) 30 MG capsule Take 30 mg daily before breakfast and you can take additional dose 67m before evening meal if needed.   metFORMIN (GLUCOPHAGE) 500 MG tablet Take 500 mg by mouth at bedtime.   NOREL AD 4-10-325 MG TABS Take 1 tablet by mouth 4 (four) times daily.   Omega-3 Fatty Acids (FISH OIL) 1000 MG CAPS Take 2 capsules by mouth daily.   polyethylene glycol powder (GLYCOLAX/MIRALAX) 17 GM/SCOOP powder Take 1/2 capful daily as needed. Make sure to take a dose if you go more than 48 hours without a bowel movement.   rosuvastatin (CRESTOR) 20 MG tablet TAKE ONE TABLET BY MOUTH EVERYDAY AT BEDTIME   triamcinolone ointment (KENALOG) 0.1 % Apply topically 2 (two) times daily as needed.   triamcinolone ointment (KENALOG) 0.5 % Apply 1 application  topically 2 (two) times daily as needed (rash).   Turmeric 500 MG CAPS Take 500 mg by mouth daily.               Past Medical History:  Diagnosis Date   Arteriosclerotic cardiovascular disease (ASCVD) 2001   Bare-metal stent placed in the right coronary artery in 12/01; residual 50% lesion of the first diagonal and mid LAD   [place]    Cerebrovascular disease    COPD (chronic obstructive pulmonary disease) (HCC)    Cyst, dermoid, arm, left 03/26/2018   Diabetes mellitus    excellent control with a low-dose of a single oral agent   Family history of breast cancer 01/23/2021   Family history of ovarian cancer 01/23/2021   GERD (gastroesophageal reflux disease)    with ulcers   History of right coronary artery stent placement 2000   Hyperlipidemia    Hypertension    Sleep apnea    Thalassemia minor    Tobacco abuse, in remission    35 pack years; Quit in 1980        Objective:      Wt Readings from Last 3 Encounters:  07/17/22 142 lb 6.4 oz (64.6 kg)  07/16/22 144 lb (65.3 kg)  07/03/22 146 lb 6.4 oz (66.4 kg)      Vital signs reviewed  07/17/2022  - Note at rest 02 sats  96% on RA   General appearance:    elderly amb bf with classic voice fatigue        I personally reviewed images and agree with radiology impression as follows:   Chest  HRCT  10/20/19  1. Redemonstrated tubular bronchiectasis of the bilateral lower lungs, worst in the medial right  lung base with some associated irregular interstitial opacity. There is additional bandlike scarring or atelectasis of the left lower lobe and lingula. Findings are most consistent with bland post infectious or inflammatory scarring, perhaps due to aspiration. There is no significant interval change. 2. Assessment for air trapping is limited by very inspiratory phase of acquired expiratory sequence. 3. Occasional small pulmonary nodules, including 9 mm nodule of the lingula, stable compared to prior examination and definitively benign. 4. Coronary artery disease.  Aortic Atherosclerosis (ICD10-I70.0).   CXR PA and Lateral:   07/03/2022 :    I personally reviewed images and impression is as follows:     Did not go to for cxr as rec   Labs ordered/ reviewed:      Chemistry      Component Value Date/Time   NA 141 07/03/2022 1411   K 4.4 07/03/2022 1411   CL 100 07/03/2022 1411   CO2 26 07/03/2022 1411   BUN 20 07/03/2022 1411   CREATININE 1.19 (H) 07/03/2022 1411   CREATININE 1.22 (H) 01/23/2021 0745      Component Value Date/Time   CALCIUM 9.2 07/03/2022 1411   ALKPHOS 65 01/23/2021 0745   AST 24 01/23/2021 0745   ALT 17 01/23/2021 0745   BILITOT 0.6 01/23/2021 0745        Lab Results  Component Value Date   WBC 7.8 07/03/2022   HGB 12.2 07/03/2022   HCT 37.6 07/03/2022   MCV 80 07/03/2022   PLT 282 07/03/2022     No results found for: "DDIMER"    Lab Results   Component Value Date   TSH 0.759 07/03/2022     BNP    22.1 07/03/2022     Rec cxr 07/17/2022 did not go for cxr as rec  Assessment

## 2022-07-17 NOTE — Patient Instructions (Addendum)
GERD (REFLUX)  is an extremely common cause of respiratory symptoms just like yours , many times with no obvious heartburn at all.    It can be treated with medication, but also with lifestyle changes including elevation of the head of your bed (ideally with 6-8inch blocks under the headboard of your bed),  Smoking cessation, avoidance of late meals, excessive alcohol, and avoid fatty foods, chocolate, peppermint, colas, red wine, and acidic juices such as orange juice.  NO MINT OR MENTHOL PRODUCTS SO NO COUGH DROPS  USE SUGARLESS CANDY INSTEAD (Jolley ranchers or Stover's or Life Savers) or even ice chips will also do - the key is to swallow to prevent all throat clearing. NO OIL BASED VITAMINS - use powdered substitutes.  Avoid fish oil when coughing.   Take delsym two tsp every 12 hours and supplement if needed with  tramadol 50 mg up to 1-2 every 4 hours to suppress the urge to cough. Swallowing water and/or using ice chips/non mint and menthol containing candies (such as lifesavers or sugarless jolly ranchers) are also effective.  You should rest your voice and avoid activities that you know make you cough.  Once you have eliminated the cough for 3 straight days try reducing the tramadol first,  then the delsym as tolerated.   Prednisone 10 mg take  4 each am x 2 days,   2 each am x 2 days,  1 each am x 2 days and stop   Please remember to go to the  x-ray department  @  [facility] for your tests - we will call you with the results when they are available   If can't stop coughing call for referral to Dr Marney Setting at [facility] voice center   Please schedule a follow up office visit in 4 weeks, sooner if needed  with all medications /inhalers/ solutions in hand so we can verify exactly what you are taking. This includes all medications from all doctors and over the counters

## 2022-07-18 ENCOUNTER — Telehealth: Payer: Self-pay | Admitting: Internal Medicine

## 2022-07-18 ENCOUNTER — Ambulatory Visit (HOSPITAL_COMMUNITY)
Admission: RE | Admit: 2022-07-18 | Discharge: 2022-07-18 | Disposition: A | Discharge status: Home / Self Care | Payer: PPO | Source: Ambulatory Visit | Attending: Internal Medicine | Admitting: Internal Medicine

## 2022-07-18 DIAGNOSIS — R053 Chronic cough: Secondary | ICD-10-CM | POA: Insufficient documentation

## 2022-07-18 DIAGNOSIS — R059 Cough, unspecified: Secondary | ICD-10-CM | POA: Diagnosis not present

## 2022-07-18 NOTE — Telephone Encounter (Signed)
I think her cough is coming from her throat and the best throat specialist for this type of problem is Dr Joya Gaskins at the voice center which is in the ENT dept at [facility]

## 2022-07-18 NOTE — Telephone Encounter (Signed)
Patient's caregiver, [name], called to ask the nurse to call regarding patient's dosage of her Prednisone.  She stated that it was not clear how she should take the medication.  Please advise and call to explain.  CB# [phone]

## 2022-07-18 NOTE — Telephone Encounter (Signed)
Spoke with patients daughter went over instructions for prednisone. Daughter is wanting to know what the voice center will do for her mom she states she didn't think It would help her. She also wants to know what's making her cough and why it has been occurring for so long. Dr. Melvyn Novas can you please advise

## 2022-07-23 ENCOUNTER — Inpatient Hospital Stay: Payer: PPO | Attending: Hematology

## 2022-07-23 DIAGNOSIS — D500 Iron deficiency anemia secondary to blood loss (chronic): Secondary | ICD-10-CM

## 2022-07-23 DIAGNOSIS — E559 Vitamin D deficiency, unspecified: Secondary | ICD-10-CM | POA: Diagnosis not present

## 2022-07-23 DIAGNOSIS — Z79899 Other long term (current) drug therapy: Secondary | ICD-10-CM | POA: Insufficient documentation

## 2022-07-23 DIAGNOSIS — D0511 Intraductal carcinoma in situ of right breast: Secondary | ICD-10-CM

## 2022-07-23 DIAGNOSIS — Z86000 Personal history of in-situ neoplasm of breast: Secondary | ICD-10-CM | POA: Insufficient documentation

## 2022-07-23 DIAGNOSIS — D509 Iron deficiency anemia, unspecified: Secondary | ICD-10-CM | POA: Diagnosis not present

## 2022-07-23 DIAGNOSIS — F32A Depression, unspecified: Secondary | ICD-10-CM | POA: Insufficient documentation

## 2022-07-23 DIAGNOSIS — M8580 Other specified disorders of bone density and structure, unspecified site: Secondary | ICD-10-CM | POA: Insufficient documentation

## 2022-07-23 DIAGNOSIS — Z9011 Acquired absence of right breast and nipple: Secondary | ICD-10-CM | POA: Diagnosis not present

## 2022-07-23 DIAGNOSIS — R634 Abnormal weight loss: Secondary | ICD-10-CM | POA: Insufficient documentation

## 2022-07-23 LAB — COMPREHENSIVE METABOLIC PANEL
ALT: 23 U/L (ref 0–44)
AST: 27 U/L (ref 15–41)
Albumin: 4 g/dL (ref 3.5–5.0)
Alkaline Phosphatase: 55 U/L (ref 38–126)
Anion gap: 10 (ref 5–15)
BUN: 29 mg/dL — ABNORMAL HIGH (ref 8–23)
CO2: 27 mmol/L (ref 22–32)
Calcium: 8.9 mg/dL (ref 8.9–10.3)
Chloride: 94 mmol/L — ABNORMAL LOW (ref 98–111)
Creatinine, Ser: 1.07 mg/dL — ABNORMAL HIGH (ref 0.44–1.00)
GFR, Estimated: 53 mL/min — ABNORMAL LOW (ref >60)
Glucose, Bld: 104 mg/dL — ABNORMAL HIGH (ref 70–99)
Potassium: 4.2 mmol/L (ref 3.5–5.1)
Sodium: 131 mmol/L — ABNORMAL LOW (ref 135–145)
Total Bilirubin: 0.7 mg/dL (ref 0.3–1.2)
Total Protein: 6.7 g/dL (ref 6.5–8.1)

## 2022-07-23 LAB — CBC WITH DIFFERENTIAL/PLATELET
Abs Immature Granulocytes: 0.07 10*3/uL (ref 0.00–0.07)
Basophils Absolute: 0 10*3/uL (ref 0.0–0.1)
Basophils Relative: 0 %
Eosinophils Absolute: 0 10*3/uL (ref 0.0–0.5)
Eosinophils Relative: 0 %
HCT: 41.5 % (ref 36.0–46.0)
Hemoglobin: 13.4 g/dL (ref 12.0–15.0)
Immature Granulocytes: 1 %
Lymphocytes Relative: 7 %
Lymphs Abs: 1 10*3/uL (ref 0.7–4.0)
MCH: 26 pg (ref 26.0–34.0)
MCHC: 32.3 g/dL (ref 30.0–36.0)
MCV: 80.6 fL (ref 80.0–100.0)
Monocytes Absolute: 0.5 10*3/uL (ref 0.1–1.0)
Monocytes Relative: 4 %
Neutro Abs: 12.9 10*3/uL — ABNORMAL HIGH (ref 1.7–7.7)
Neutrophils Relative %: 88 %
Platelets: 296 10*3/uL (ref 150–400)
RBC: 5.15 MIL/uL — ABNORMAL HIGH (ref 3.87–5.11)
RDW: 15.1 % (ref 11.5–15.5)
WBC: 14.6 10*3/uL — ABNORMAL HIGH (ref 4.0–10.5)
nRBC: 0 % (ref 0.0–0.2)

## 2022-07-23 LAB — IRON AND TIBC
Iron: 62 ug/dL (ref 28–170)
Saturation Ratios: 21 % (ref 10.4–31.8)
TIBC: 299 ug/dL (ref 250–450)
UIBC: 237 ug/dL

## 2022-07-23 LAB — FERRITIN: Ferritin: 160 ng/mL (ref 11–307)

## 2022-07-23 LAB — VITAMIN D 25 HYDROXY (VIT D DEFICIENCY, FRACTURES): Vit D, 25-Hydroxy: 54.02 ng/mL (ref 30–100)

## 2022-07-24 ENCOUNTER — Emergency Department (HOSPITAL_COMMUNITY): Admission: EM | Admit: 2022-07-24 | Discharge: 2022-07-24 | Discharge status: Absent without leave | Payer: PPO

## 2022-07-24 LAB — CBG MONITORING, ED: Glucose-Capillary: 86 mg/dL (ref 70–99)

## 2022-07-29 ENCOUNTER — Other Ambulatory Visit: Payer: Self-pay

## 2022-07-29 DIAGNOSIS — H47233 Glaucomatous optic atrophy, bilateral: Secondary | ICD-10-CM | POA: Diagnosis not present

## 2022-07-29 DIAGNOSIS — H401131 Primary open-angle glaucoma, bilateral, mild stage: Secondary | ICD-10-CM | POA: Diagnosis not present

## 2022-07-29 DIAGNOSIS — H2513 Age-related nuclear cataract, bilateral: Secondary | ICD-10-CM | POA: Diagnosis not present

## 2022-07-29 DIAGNOSIS — H04123 Dry eye syndrome of bilateral lacrimal glands: Secondary | ICD-10-CM | POA: Diagnosis not present

## 2022-07-29 DIAGNOSIS — R053 Chronic cough: Secondary | ICD-10-CM

## 2022-07-29 NOTE — Progress Notes (Unsigned)
[place], [place] [address]   CLINIC:  Medical Oncology/Hematology  PCP:  Nancy Lei, MD [address] / [place] [address] [phone]   REASON FOR VISIT:  Follow-up for history of multifocal right breast DCIS + iron deficiency anemia  PRIOR THERAPY: S/p right-sided mastectomy (02/20/2021)  CURRENT THERAPY: Surveillance of breast cancer with intermittent IV iron  BRIEF ONCOLOGIC HISTORY:   Oncology History  Ductal carcinoma in situ (DCIS) of right breast  01/01/2021 Mammogram   FINDINGS: Multiple circumscribed oval masses are noted in the MEDIAL portion of the RIGHT breast. Masses vary from 1-4 millimeters in diameter and appear contiguous, spanning 8.9 x 3.6 x 5.1 centimeters. There are faint calcifications spanning 4.2 x 1.9 x 3.5 centimeters in this same, segmental distribution.   01/14/2021 Cancer Staging   Staging form: Breast, AJCC 8th Edition - Clinical stage from 01/14/2021: Stage 0 (cTis (DCIS), cN0, cM0, G2, ER+, PR+, HER2: Not Assessed) - Signed by Nancy Merle, MD on 01/21/2021 Stage prefix: Initial diagnosis Histologic grading system: 3 grade system   01/14/2021 Pathology Results   Diagnosis 1. Breast, right, needle core biopsy, anterior extent - DUCTAL CARCINOMA IN SITU, INTERMEDIATE GRADE WITH CALCIFICATIONS. SEE NOTE 2. Breast, right, needle core biopsy, posterior extent - DUCTAL CARCINOMA IN SITU, INTERMEDIATE GRADE WITH CALCIFICATIONS. SEE NOTE Diagnosis Note 1. and 2. DCIS measures 0.6 cm in part 1 and 0.4 cm in part 2.  1. PROGNOSTIC INDICATORS Results: IMMUNOHISTOCHEMICAL AND MORPHOMETRIC ANALYSIS PERFORMED MANUALLY Estrogen Receptor: >95%, POSITIVE, STRONG STAINING INTENSITY Progesterone Receptor: >95%, POSITIVE, STRONG STAINING INTENSITY     01/17/2021 Initial Diagnosis   Ductal carcinoma in situ (DCIS) of right breast   02/05/2021 Genetic Testing   Negative hereditary cancer genetic testing: no  pathogenic variants detected in [facility] CustomNext-Cancer +RNAinsight Panel.  The report date is February 05, 2021.   The CustomNext-Cancer+RNAinsight panel offered by [facility] includes sequencing and rearrangement analysis for the following 47 genes:  APC, ATM, AXIN2, BARD1, BMPR1A, BRCA1, BRCA2, BRIP1, CDH1, CDK4, CDKN2A, CHEK2, DICER1, EPCAM, GREM1, HOXB13, MEN1, MLH1, MSH2, MSH3, MSH6, MUTYH, NBN, NF1, NF2, NTHL1, PALB2, PMS2, POLD1, POLE, PTEN, RAD51C, RAD51D, RECQL, RET, SDHA, SDHAF2, SDHB, SDHC, SDHD, SMAD4, SMARCA4, STK11, TP53, TSC1, TSC2, and VHL.  RNA data is routinely analyzed for use in variant interpretation for all genes.   02/20/2021 Cancer Staging   Staging form: Breast, AJCC 8th Edition - Pathologic stage from 02/20/2021: No Stage Recommended (pTis (DCIS), pNX, cM1, G2, ER+, PR+, HER2: Not Assessed) - Signed by Nancy Merle, MD on 03/12/2021 Stage prefix: Initial diagnosis Histologic grading system: 3 grade system Residual tumor (R): R0 - None   02/20/2021 Definitive Surgery   FINAL MICROSCOPIC DIAGNOSIS:   A. BREAST, RIGHT, MASTECTOMY:  - Multifocal intermediate grade ductal carcinoma in situ with calcifications.  - Atypical lobular hyperplasia.  - Margins are negative for carcinoma.  - Biopsy site (x2).  - See oncology table.      CANCER STAGING: Cancer Staging  Ductal carcinoma in situ (DCIS) of right breast Staging form: Breast, AJCC 8th Edition - Clinical stage from 01/14/2021: Stage 0 (cTis (DCIS), cN0, cM0, G2, ER+, PR+, HER2: Not Assessed) - Signed by Nancy Merle, MD on 01/21/2021 - Pathologic stage from 02/20/2021: No Stage Recommended (pTis (DCIS), pNX, cM1, G2, ER+, PR+, HER2: Not Assessed) - Signed by Nancy Merle, MD on 03/12/2021   INTERVAL HISTORY:   Nancy Liu, a 82 y.o. female,  returns for routine follow-up of her iron deficiency anemia and breast cancer. [name] was last evaluated via telemedicine visit by Nancy Abernethy PA-C on 04/08/2022.   Patient history somewhat difficult to follow as her thoughts are scattered and tangential.  BREAST CANCER HISTORY:   She denies any symptoms of recurrence such as new lumps, bone pain,or abdominal pain.  She has no new headaches, seizures, or focal neurologic deficits.  No B symptoms such as fever or night sweats.  Progressive weight loss addressed below.    IRON DEFICIENCY ANEMIA:  She is taking iron tablet once daily.  She continues to have chronically dark bowel movements after starting iron pills, but denies any gross melena or hematochezia. She reports significant fatigue. She has some chronic dyspnea on exertion.  She reports occasional chest pain, but denies any chest pain today; she follows with cardiology annually and has been advised to speak to them about her treatment and chest pain.  She denies any pica, restless legs, headache, lightheadedness, or syncope.  She frequently "feels cold."  VITAMIN D DEFICIENCY: She is taking vitamin D 3000 units daily.  DEPRESSION: Her depression is being managed by her PCP.  She was started on citalopram 10 mg daily, but reports that she stopped taking this for unknown reasons.  She is unclear as to whether or not this was helping her mood.  She continues to report low energy, poor motivation, and low appetite.  She attributes this to her diagnosis of breast cancer and isolation related to the COVID-19 pandemic.  She is trying to find a licensed therapist with the assistance of her primary care provider.  In the interim, she has been seeing her pastor's wife for spiritual counseling.  She found this helpful, but stopped going to counseling because she was afraid that "she was a burden."  She reports feeling stressed and feeling that she is forgetting things.  She denies any thoughts of self-harm or suicidal ideation/intent.  WEIGHT LOSS: Her weight today is 139 pounds, and she has lost about 10 pounds in the past 6 months.  She reports very low appetite and  little interest in eating.  She drinks at least 2 Ensure each day.  She reports 40% energy and little to no appetite.  She is maintaining stable weight at this time.  ASSESSMENT & PLAN:  1.  Multifocal right breast DCIS, grade 2, ER/PR+, and ALH  - She was initially managed by Dr. Burr Medico at [facility].  Transferred care to [facility] ([place]) due to closer proximity to home.   - S/p right mastectomy 02/20/21 by Dr. Donne Hazel. Path showed multifocal intermediate grade DCIS, surgical margins were negative.  It also showed atypical lobular hyperplasia [facility]), which is a high risk for future breast cancer. - Due to her advanced age and potential side effects, she did not proceed with anti-estrogen therapy  - Left mammogram from April 2023 was negative. - MRI breast (05/05/2022): Two  5-6 mm enhancing foci in the left breast, BI-RADS Category 4, suspicious - MRI guided left breast biopsy (05/12/2022): Pathology revealed benign breast parenchyma with sclerosing adenosis, fibrocystic change, apocrine metaplasia and microcalcification - Physical exam showed right mastectomy site within normal limits.  Left nipple retraction is chronic (see note by NP Cira Rue from 01/15/2022).  No discrete nodules, masses, or lymphadenopathy palpated. - Labs (07/23/2022) nonconcerning for recurrent breast cancer - History/ROS negative for any "red flag" symptoms of recurrence  - No clinical concern for new or  recurrent breast cancer at this time.  - PLAN: Continue surveillance with high risk screening protocol to include annual left mammogram and annual breast MRI, staggered every 6 months for the next 5 years. Breast MRI due October 2024 Left mammogram due April 2024 - Office visit and physical exam every 6 months - We will plan on office visit in February 2023 with labs and physical exam   2.  Microcytic anemia from iron deficiency - Secondary to iron deficiency, which is most likely due to small  bowel AVMs.  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 - Patient has no M spike on SPEP. - She was Hemoccult stool negative in March 2022. - GI capsule study in May 2022 with few gastric and small bowel erosions and possible small bowel AVM without any active bleeding. - EGD (02/28/2020): Gastritis, otherwise normal - Colonoscopy (03/03/2016): Diverticulosis, nonbleeding internal hemorrhoids - She last received Feraheme on 01/30/2022. - She is taking iron tablet once daily - She continues to have chronic fatigue.     - Most recent labs (07/23/2022): WBC 14.6/ANC 12.9.  Normal Hgb 13.4/MCV 80.6.  Ferritin 160, iron saturation 21%.  (NOTE: Leukocytosis likely secondary to Depo-Medrol shot received on 07/03/2022) - PLAN: No indication for IV iron at this time.  Continue daily iron supplementation. - Repeat labs and RTC in 6 months (attention to elevated WBC at follow-up)    3.  Vitamin D deficiency - Patient was previously taking vitamin D 50,000 units weekly, noted to have elevated vitamin D 133.41 (05/13/2021), therefore vitamin D was held. - She is currently taking vitamin D 3000 units daily  - Most recent vitamin D (07/23/2022): Normal at 54.02  - PLAN: Continue taking vitamin D 3000 units daily.  Recheck at follow-up in 6 months.   4.  Depression and weight loss - Reports worsening depression over the past several years - Decreased appetite and oral intake secondary to depression - Started on citalopram 10 mg daily by PCP, but patient stopped taking this for unknown reason. - Patient is in the process of finding therapist.  She was seeing her pastor's wife for spiritual counseling, but stopped going because she "was afraid she was a burden." - Oral intake is about 50% of previous oral intake, she is drinking Ensure twice daily - She denies any suicidal ideation/intent or thoughts of self-harm - Weight today is down 10 pounds in the past 6 months -  PLAN: Advised patient to speak with her PCP regarding restarting citalopram or other agent.  At today's visit, patient is not interested in restarting medications. - Advised patient to continue counseling with her pastor's wife and to seek additional help from a licensed clinical therapist. - Patient advised to seek immediate medical attention if she had any thoughts of self-harm or suicidal ideation/intent.  (Denies at today's visit) - Continue at least 2 Ensure daily.  Encourage small frequent meals throughout the day. - Prescription to pharmacy for Marinol 2.5 mg twice daily as appetite stimulant. - We will see her for office visit and weight check in 3 months.  Consider additional workup if she has weight loss out of proportion to nutritional intake.  5.  Bone health  - DEXA scan 09/12/21 showed mild osteopenia, lowest T score -1.2 at the R femur neck - PLAN: Continue calcium, vitamin D, and weightbearing exercise.  PLAN SUMMARY: >> Diagnostic mammogram (left breast) in April 2024 >> Office visit in 3 months for weight  check (no labs needed)    REVIEW OF SYSTEMS:   Review of Systems  Constitutional:  Positive for appetite change, fatigue and unexpected weight change. Negative for chills, diaphoresis and fever.  HENT:   Negative for lump/mass and nosebleeds.   Eyes:  Negative for eye problems.  Respiratory:  Positive for cough and shortness of breath. Negative for hemoptysis.   Cardiovascular:  Positive for chest pain (Intermittently, none today). Negative for leg swelling and palpitations.  Gastrointestinal:  Negative for abdominal pain, blood in stool, constipation, diarrhea, nausea and vomiting.  Genitourinary:  Negative for hematuria.   Skin: Negative.   Neurological:  Positive for numbness. Negative for dizziness, headaches and light-headedness.  Hematological:  Does not bruise/bleed easily.  Psychiatric/Behavioral:  Positive for depression.     PHYSICAL EXAM:   Performance  status (ECOG): 1 - Symptomatic but completely ambulatory  There were no vitals filed for this visit. Wt Readings from Last 3 Encounters:  07/17/22 142 lb 6.4 oz (64.6 kg)  07/16/22 144 lb (65.3 kg)  07/03/22 146 lb 6.4 oz (66.4 kg)   Physical Exam Constitutional:      Appearance: Normal appearance.  HENT:     Head: Normocephalic and atraumatic.     Mouth/Throat:     Mouth: Mucous membranes are moist.  Eyes:     Extraocular Movements: Extraocular movements intact.     Pupils: Pupils are equal, round, and reactive to light.  Cardiovascular:     Rate and Rhythm: Normal rate and regular rhythm.     Pulses: Normal pulses.     Heart sounds: Normal heart sounds.  Pulmonary:     Effort: Pulmonary effort is normal.     Breath sounds: Normal breath sounds.  Chest:       Comments: Right mastectomy site within normal limits.  Left nipple inversion is chronic and at baseline.  No discrete nodules, masses, or lymphadenopathy palpated. Abdominal:     General: Bowel sounds are normal.     Palpations: Abdomen is soft.     Tenderness: There is no abdominal tenderness.  Musculoskeletal:        General: No swelling.     Right lower leg: No edema.     Left lower leg: No edema.  Lymphadenopathy:     Cervical: No cervical adenopathy.     Upper Body:     Right upper body: No supraclavicular, axillary or pectoral adenopathy.     Left upper body: No supraclavicular, axillary or pectoral adenopathy.  Skin:    General: Skin is warm and dry.  Neurological:     General: No focal deficit present.     Mental Status: She is alert and oriented to person, place, and time.  Psychiatric:        Mood and Affect: Mood normal.        Behavior: Behavior normal.     Comments: Thoughts seem somewhat scattered and tangential.     PAST MEDICAL/SURGICAL HISTORY:  Past Medical History:  Diagnosis Date   Arteriosclerotic cardiovascular disease (ASCVD) 2001   Bare-metal stent placed in the right coronary  artery in 12/01; residual 50% lesion of the first diagonal and mid LAD   Breast cancer (HCC)    Cerebrovascular disease    COPD (chronic obstructive pulmonary disease) (HCC)    Cyst, dermoid, arm, left 03/26/2018   Diabetes mellitus    excellent control with a low-dose of a single oral agent   Family history of breast cancer 01/23/2021  Family history of ovarian cancer 01/23/2021   GERD (gastroesophageal reflux disease)    with ulcers   History of right coronary artery stent placement 2000   Hyperlipidemia    Hypertension    Sleep apnea    Thalassemia minor    Tobacco abuse, in remission    35 pack years; Quit in 1980   Past Surgical History:  Procedure Laterality Date   BIOPSY  02/28/2020   Procedure: BIOPSY;  Surgeon: Eloise Harman, DO;  [facility];  Service: Endoscopy;;   COLONOSCOPY  2006   Dr. Collene Mares: normal   COLONOSCOPY  2000   Dr. Everlean Cherry: normal    COLONOSCOPY N/A 03/03/2016   Dr. Oneida Alar: diverticulosis, non-bleeding hemorrhoids, redundant left colon.   DILATION AND CURETTAGE OF UTERUS     ESOPHAGOGASTRODUODENOSCOPY  2000   Dr. Everlean Cherry: gastritis    ESOPHAGOGASTRODUODENOSCOPY  2006   Dr. Collene Mares: small hiatal hernia, gastritis, negative H.pylori    ESOPHAGOGASTRODUODENOSCOPY N/A 03/03/2016   Procedure: ESOPHAGOGASTRODUODENOSCOPY (EGD);  Surgeon: Danie Binder, MD;  [facility];  Service: Endoscopy;  Laterality: N/A;   ESOPHAGOGASTRODUODENOSCOPY N/A 06/03/2016   Dr. Oneida Alar: nonaggressive gastritis due to aspirin use. Previous ulcers had healed.   ESOPHAGOGASTRODUODENOSCOPY (EGD) WITH PROPOFOL N/A 02/28/2020   focal inflammation in the gastric antrum consistent with gastritis on biopsies. No H.pylori.    EXCISION OF KELOID Left 03/26/2018   Procedure: EXCISION OF LEFT ARM MASS;  Surgeon: Fanny Skates, MD;  Location: [place];  Service: General;  Laterality: Left;   GIVENS CAPSULE STUDY N/A 10/10/2020   Procedure: GIVENS CAPSULE  STUDY;  Surgeon: Eloise Harman, DO;  [facility];  Service: Endoscopy;  Laterality: N/A;  7:30am   INGUINAL HERNIA REPAIR  1970s   Right   TOTAL MASTECTOMY Right 02/20/2021   Procedure: RIGHT TOTAL MASTECTOMY;  Surgeon: Rolm Bookbinder, MD;  Location: [place];  Service: General;  Laterality: Right;   VAGINAL HYSTERECTOMY  1980   Unilateral oophorectomy    SOCIAL HISTORY:  Social History   Socioeconomic History   Marital status: Divorced    Spouse name: Not on file   Number of children: 3   Years of education: Not on file   Highest education level: Not on file  Occupational History   Occupation: Retired    Comment: [employer] work  Tobacco Use   Smoking status: Former    Packs/day: 1.00    Years: 18.00    Total pack years: 18.00    Types: Cigarettes    Start date: 06/02/1965    Quit date: 06/09/1980    Years since quitting: 42.1   Smokeless tobacco: Never  Vaping Use   Vaping Use: Never used  Substance and Sexual Activity   Alcohol use: No    Alcohol/week: 0.0 standard drinks of alcohol   Drug use: No   Sexual activity: Not Currently  Other Topics Concern   Not on file  Social History Narrative   Lives with daughter    Social Determinants of Health   Financial Resource Strain: [place]  (06/04/2021)   Overall Financial Resource Strain (CARDIA)    Difficulty of Paying Living Expenses: Not hard at all  Food Insecurity: No Food Insecurity (01/23/2021)   Hunger Vital Sign    Worried About Running Out of Food in the Last Year: Never true    [place] in the Last Year: Never true  Transportation Needs: No Transportation Needs (01/23/2021)  PRAPARE - Hydrologist (Medical): No    Lack of Transportation (Non-Medical): No  Physical Activity: Inactive (06/04/2021)   Exercise Vital Sign    Days of Exercise per Week: 0 days    Minutes of Exercise per Session: 0 min  Stress: Stress Concern Present (06/04/2021)   [place]    Feeling of Stress : To some extent  Social Connections: Moderately Isolated (06/04/2021)   Social Connection and Isolation Panel [NHANES]    Frequency of Communication with Friends and Family: More than three times a week    Frequency of Social Gatherings with Friends and Family: More than three times a week    Attends Religious Services: More than 4 times per year    Active Member of [employer] or Organizations: No    Attends Archivist Meetings: Never    Marital Status: Divorced  Human resources officer Violence: Not At Risk (06/04/2021)   Humiliation, Afraid, Rape, and Kick questionnaire    Fear of Current or Ex-Partner: No    Emotionally Abused: No    Physically Abused: No    Sexually Abused: No    FAMILY HISTORY:  Family History  Problem Relation Age of Onset   Heart disease Mother        also hypertension and asthma   Lung cancer Mother        dx 4s   Ovarian cancer Mother        dx before 68   Diabetes Half-Sister    Breast cancer Half-Sister 73   Multiple myeloma Nephew 30       war-related exposures   Colon cancer Neg Hx    Colon polyps Neg Hx     CURRENT MEDICATIONS:  Current Outpatient Medications  Medication Sig Dispense Refill   acetaminophen (TYLENOL) 500 MG tablet Take 1,000 mg by mouth every 6 (six) hours as needed for moderate pain or headache.     albuterol (VENTOLIN HFA) 108 (90 Base) MCG/ACT inhaler Inhale 2 puffs into the lungs every 6 (six) hours as needed for wheezing or shortness of breath. 8 g 2   amLODipine (NORVASC) 10 MG tablet TAKE ONE TABLET BY MOUTH EVERYDAY AT BEDTIME 90 tablet 3   aspirin EC 81 MG tablet Take 81 mg by mouth daily. Swallow whole.     carvedilol (COREG) 6.25 MG tablet TAKE ONE TABLET BY MOUTH BEFORE BREAKFAST and TAKE ONE TABLET BY MOUTH EVERYDAY AT BEDTIME 180 tablet 3   Cholecalciferol (VITAMIN D3) 50 MCG (2000 UT) TABS TAKE ONE TABLET BY MOUTH daily in addition TO THE  1,000unit FOR total of 3,000units daily 30 tablet 1   Cholecalciferol 25 MCG (1000 UT) tablet TAKE ONE TABLET BY MOUTH daily along with THE 2,000unit FOR A total of 3,000units daily 30 tablet 1   irbesartan-hydrochlorothiazide (AVALIDE) 300-12.5 MG tablet Take 1 tablet by mouth daily before breakfast.     lansoprazole (PREVACID) 30 MG capsule Take 30 mg daily before breakfast and you can take additional dose '30mg'$  before evening meal if needed. 180 capsule 3   metFORMIN (GLUCOPHAGE) 500 MG tablet Take 500 mg by mouth at bedtime.     NOREL AD 4-10-325 MG TABS Take 1 tablet by mouth 4 (four) times daily.     Omega-3 Fatty Acids (FISH OIL) 1000 MG CAPS Take 2 capsules by mouth daily.     polyethylene glycol powder (GLYCOLAX/MIRALAX) 17 GM/SCOOP powder Take 1/2 capful daily  as needed. Make sure to take a dose if you go more than 48 hours without a bowel movement. 255 g 3   predniSONE (DELTASONE) 10 MG tablet Take  4 each am x 2 days,   2 each am x 2 days,  1 each am x 2 days and stop 14 tablet 0   rosuvastatin (CRESTOR) 20 MG tablet TAKE ONE TABLET BY MOUTH EVERYDAY AT BEDTIME 90 tablet 3   triamcinolone ointment (KENALOG) 0.1 % Apply topically 2 (two) times daily as needed.     triamcinolone ointment (KENALOG) 0.5 % Apply 1 application  topically 2 (two) times daily as needed (rash).     Turmeric 500 MG CAPS Take 500 mg by mouth daily.     No current facility-administered medications for this visit.    ALLERGIES:  Allergies  Allergen Reactions   Protonix [Pantoprazole Sodium]     DRY LIPS    LABORATORY DATA:  I have reviewed the labs as listed.     Latest Ref Rng & Units 07/23/2022    1:03 PM 07/03/2022    2:11 PM 01/17/2022    9:33 AM  CBC  WBC 4.0 - 10.5 K/uL 14.6  7.8  7.3   Hemoglobin 12.0 - 15.0 g/dL 13.4  12.2  12.3   Hematocrit 36.0 - 46.0 % 41.5  37.6  38.8   Platelets 150 - 400 K/uL 296  282  250       Latest Ref Rng & Units 07/23/2022    1:03 PM 07/03/2022    2:11 PM 01/23/2021     7:45 AM  CMP  Glucose 70 - 99 mg/dL 104  86  112   BUN 8 - 23 mg/dL '29  20  21   '$ Creatinine 0.44 - 1.00 mg/dL 1.07  1.19  1.22   Sodium 135 - 145 mmol/L 131  141  142   Potassium 3.5 - 5.1 mmol/L 4.2  4.4  4.2   Chloride 98 - 111 mmol/L 94  100  101   CO2 22 - 32 mmol/L '27  26  29   '$ Calcium 8.9 - 10.3 mg/dL 8.9  9.2  10.0   Total Protein 6.5 - 8.1 g/dL 6.7   7.4   Total Bilirubin 0.3 - 1.2 mg/dL 0.7   0.6   Alkaline Phos 38 - 126 U/L 55   65   AST 15 - 41 U/L 27   24   ALT 0 - 44 U/L 23   17     DIAGNOSTIC IMAGING:  I have independently reviewed the scans and discussed with the patient. DG Chest 2 View  Result Date: 07/22/2022 CLINICAL DATA:  Dry cough for the past year. EXAM: CHEST - 2 VIEW COMPARISON:  02/12/2021 FINDINGS: Normal sized heart. Mild peribronchial thickening. No airspace consolidation or pleural fluid. Stable mild elevation of the left hemidiaphragm. Interval mild gaseous and fluid distention of the stomach. Mild scoliosis. IMPRESSION: Mild bronchitic changes. Electronically Signed   By: Claudie Revering M.D.   On: 07/22/2022 17:12     WRAP UP:  All questions were answered. The patient knows to call the clinic with any problems, questions or concerns.  Medical decision making: Moderate  Time spent on visit: I spent 20 minutes counseling the patient face to face. The total time spent in the appointment was 30 minutes and more than 50% was on counseling.  Harriett Rush, PA-C  07/31/22 11:46 AM

## 2022-07-29 NOTE — Telephone Encounter (Signed)
Referral placed [facility].

## 2022-07-29 NOTE — Telephone Encounter (Signed)
Ok Dr Addison [facility]

## 2022-07-30 ENCOUNTER — Inpatient Hospital Stay (HOSPITAL_BASED_OUTPATIENT_CLINIC_OR_DEPARTMENT_OTHER): Payer: PPO | Admitting: Physician Assistant

## 2022-07-30 ENCOUNTER — Ambulatory Visit: Payer: PPO | Admitting: Physician Assistant

## 2022-07-30 VITALS — BP 111/71 | HR 75 | Temp 98.3°F | Resp 18 | Ht 62.5 in | Wt 139.9 lb

## 2022-07-30 DIAGNOSIS — R634 Abnormal weight loss: Secondary | ICD-10-CM

## 2022-07-30 DIAGNOSIS — D500 Iron deficiency anemia secondary to blood loss (chronic): Secondary | ICD-10-CM

## 2022-07-30 DIAGNOSIS — N6091 Unspecified benign mammary dysplasia of right breast: Secondary | ICD-10-CM

## 2022-07-30 DIAGNOSIS — E559 Vitamin D deficiency, unspecified: Secondary | ICD-10-CM

## 2022-07-30 DIAGNOSIS — Z86000 Personal history of in-situ neoplasm of breast: Secondary | ICD-10-CM | POA: Diagnosis not present

## 2022-07-30 DIAGNOSIS — F32A Depression, unspecified: Secondary | ICD-10-CM

## 2022-07-30 DIAGNOSIS — D0511 Intraductal carcinoma in situ of right breast: Secondary | ICD-10-CM

## 2022-07-30 MED ORDER — DRONABINOL 2.5 MG PO CAPS: 2.5000 mg | ORAL_CAPSULE | Freq: Two times a day (BID) | ORAL | 5 refills | Status: DC

## 2022-07-30 NOTE — Patient Instructions (Signed)
[place] [place] **   You were seen today by Tarri Abernethy PA-C for your follow-up visit.    HISTORY OF BREAST CANCER: You did not have any evidence of breast cancer on today's exam. You will be scheduled for mammogram in April 2024.  IRON DEFICIENCY ANEMIA Your blood and iron levels look great!  Continue to take your iron tablet once daily.  VITAMIN D DEFICIENCY: Continue to take vitamin D 1000 units once a day  WEIGHT LOSS & LOW APPETITE I will send a prescription for you to start taking Marinol as an appetite stimulant. Please drink 2-3 Ensure or Boost each day. We will see you for follow-up visit in 3 months to follow-up on your weight loss.  DEPRESSION Please talk to your primary care doctor about whether or not you should be started back on your antidepressant medication. I also recommend that you continue to speak with a counselor, whether this is your pastors wife or another licensed therapist.  Your primary care doctor can help you find an appropriate therapist.  CHEST PAIN Please talk to your primary care doctor about your chest pain. If you have any chest pain that does not go away, please go immediately to the emergency department.  FOLLOW-UP APPOINTMENT: Office visit in 3 months  ** Thank you for trusting me with your healthcare!  I strive to provide all of my patients with quality care at each visit.  If you receive a survey for this visit, I would be so grateful to you for taking the time to provide feedback.  Thank you in advance!  ~ Shirrell Solinger                   Dr. Derek Jack   &   Tarri Abernethy, PA-C   - - - - - - - - - - - - - - - - - -    Thank you for choosing [place] at [facility] to provide your oncology and hematology care.  To afford each patient quality time with our provider, please arrive at least 15 minutes before your scheduled  appointment time.   If you have a lab appointment with the [facility] please come in thru the Main Entrance and check in at the main information desk.  You need to re-schedule your appointment should you arrive 10 or more minutes late.  We strive to give you quality time with our providers, and arriving late affects you and other patients whose appointments are after yours.  Also, if you no show three or more times for appointments you may be dismissed from the clinic at the providers discretion.     Again, thank you for choosing [facility]  Our hope is that these requests will decrease the amount of time that you wait before being seen by our physicians.       _____________________________________________________________  Should you have questions after your visit to [facility], please contact our office at [phone] and follow the prompts.  Our office hours are 8:00 a.m. and 4:30 p.m. Monday - Friday.  Please note that voicemails left after 4:00 p.m. may not be returned until the following business day.  We are closed weekends and major holidays.  You do have access to a nurse 24-7, just call the main number to the clinic [phone] and do not press any options, hold on the  line and a nurse will answer the phone.    For prescription refill requests, have your pharmacy contact our office and allow 72 hours.

## 2022-07-31 ENCOUNTER — Other Ambulatory Visit: Payer: Self-pay

## 2022-07-31 ENCOUNTER — Encounter: Payer: Self-pay | Admitting: Hematology

## 2022-07-31 DIAGNOSIS — R634 Abnormal weight loss: Secondary | ICD-10-CM

## 2022-07-31 DIAGNOSIS — I10 Essential (primary) hypertension: Secondary | ICD-10-CM | POA: Diagnosis not present

## 2022-07-31 DIAGNOSIS — E1165 Type 2 diabetes mellitus with hyperglycemia: Secondary | ICD-10-CM | POA: Diagnosis not present

## 2022-07-31 MED ORDER — DRONABINOL 2.5 MG PO CAPS: 2.5000 mg | ORAL_CAPSULE | Freq: Two times a day (BID) | ORAL | 5 refills | Status: DC

## 2022-08-04 ENCOUNTER — Telehealth: Payer: Self-pay | Admitting: *Deleted

## 2022-08-04 ENCOUNTER — Encounter: Payer: Self-pay | Admitting: *Deleted

## 2022-08-04 ENCOUNTER — Other Ambulatory Visit: Payer: Self-pay | Admitting: Physician Assistant

## 2022-08-04 DIAGNOSIS — R634 Abnormal weight loss: Secondary | ICD-10-CM

## 2022-08-04 DIAGNOSIS — F32A Depression, unspecified: Secondary | ICD-10-CM

## 2022-08-04 MED ORDER — MIRTAZAPINE 7.5 MG PO TABS: 7.5000 mg | ORAL_TABLET | Freq: Every day | ORAL | 3 refills | Status: DC

## 2022-08-04 NOTE — Telephone Encounter (Signed)
Since Marinol was not covered by patient's insurance,mirtazapine 7.5 mg QHS has been sent to pharmacy per Tarri Abernethy PA-C.  Patient advised that this potentially relieve both her poor appetite and her underlying depression.  Verbalized understanding.

## 2022-08-04 NOTE — Progress Notes (Signed)
Insurance has denied Dronabinol, as it does not meet the prescribing criteria for her condition.  Nancy Liu made aware and will send in an alternative therapy.

## 2022-08-04 NOTE — Progress Notes (Signed)
Marinol not covered by patient's insurance as appetite stimulant. Prescription sent to pharmacy for mirtazapine 7.5 mg QHS, which will potentially relieve both her poor appetite and her underlying depression. Nurse will call to notify patient.

## 2022-08-11 ENCOUNTER — Other Ambulatory Visit: Payer: Self-pay | Admitting: *Deleted

## 2022-08-19 DIAGNOSIS — E559 Vitamin D deficiency, unspecified: Secondary | ICD-10-CM | POA: Diagnosis not present

## 2022-08-19 DIAGNOSIS — R251 Tremor, unspecified: Secondary | ICD-10-CM | POA: Diagnosis not present

## 2022-08-19 DIAGNOSIS — E1169 Type 2 diabetes mellitus with other specified complication: Secondary | ICD-10-CM | POA: Diagnosis not present

## 2022-08-19 DIAGNOSIS — F323 Major depressive disorder, single episode, severe with psychotic features: Secondary | ICD-10-CM | POA: Diagnosis not present

## 2022-08-19 DIAGNOSIS — I10 Essential (primary) hypertension: Secondary | ICD-10-CM | POA: Diagnosis not present

## 2022-08-19 DIAGNOSIS — F0631 Mood disorder due to known physiological condition with depressive features: Secondary | ICD-10-CM | POA: Diagnosis not present

## 2022-08-19 DIAGNOSIS — E785 Hyperlipidemia, unspecified: Secondary | ICD-10-CM | POA: Diagnosis not present

## 2022-08-19 DIAGNOSIS — F064 Anxiety disorder due to known physiological condition: Secondary | ICD-10-CM | POA: Diagnosis not present

## 2022-08-24 NOTE — Progress Notes (Unsigned)
Nancy Liu, female    DOB: Mar 16, 1941    MRN: [id number]   Brief patient profile:  40 yobf quit smoking 1989   self referred to pulmonary clinic in [place]  07/03/2022  for refractory cough flared summer 2023 but onset per record was really 2008   Has seen Dr Halford Chessman with working dx of bronchiectasis by HRCT  10/20/19    History of Present Illness  07/03/2022  Pulmonary/ 1st office eval/ Lamia Mariner / [place] [facility]  Chief Complaint  Patient presents with   Acute Visit    Ongoing "bad cough"  Patient states no improvement and lately the cough causes her nose to run. Neg covid test   Dyspnea:  also onset with cough/ esp steps now much more difficult  Cough: urge to clear throat during the day / worse immediately at hs/ assoc with watery pnds no better on H1/ assoc hoarseness and throat discomfort  Sleep: nightly  SABA use: don't help 02: none  Rec Continue prevacid 30 mg Take 30- 60 min before your first and last meals of the day  GERD diet reviewed, bed blocks rec  For drainage / throat tickle try take CHLORPHENIRAMINE  4 mg   Depomerdrol 120 mg IM today  For cough > mucinex dm 1200 mg every 12 hours as needed  Return to this clinic if not better with all medications with you in 2 weeks    07/17/2022  f/u ov/[place] office/Eann Cleland re: chronic cough  maint on gerd rx/ h1 / tessalon/ depomedrol 120 >>> no better   Chief Complaint  Patient presents with   Follow-up    Coughing a lot. Daughter is requesting that patient has an X Ray done on throat and chest area and states her cough is not getting any better at all   Dyspnea:  grocer store walking makes her sob x sev years  Cough: wakes up due to 5 am  cough to point of gag but nothing usually speck or two of clear mucs produced  Sleeping: cough at hs but resolves  during the night SABA use: none  02: none  Rec GERD diet reviewed, bed blocks rec  Take delsym two tsp every 12 hours and supplement if needed with  tramadol 50 mg  up to 1-2 every 4 hours Prednisone 10 mg take  4 each am x 2 days,   2 each am x 2 days,  1 each am x 2 days and stop  If can't stop coughing call for referral to Dr Joya Gaskins  at [facility] voice center  Please schedule a follow up office visit in 4 weeks, sooner if needed  with all medications /inhalers/ solutions in hand   Cxr ok    08/25/2022  f/u ov/[place] office/Mikeal Winstanley re: cough x 2008/ bronchiectasis on HRCT  maint on gerd rx  did bring meds  Chief Complaint  Patient presents with   Follow-up    Pt f/u for chronic cough, states that she is doing well  Dyspnea:  can shop for an hour, slow pace sometime with midline cp x 2 min / already under care of cards  Cough: better since last ov - no longer waking up coughing  Sleeping: no noct  cough / using planning  SABA use: none  02: none      No obvious day to day or daytime variability or assoc excess/ purulent sputum or mucus plugs or hemoptysis or cp or chest tightness, subjective wheeze or overt sinus or hb  symptoms.   Sleeping without nocturnal  or early am exacerbation  of respiratory  c/o's or need for noct saba. Also denies any obvious fluctuation of symptoms with weather or environmental changes or other aggravating or alleviating factors except as outlined above   No unusual exposure hx or h/o childhood pna/ asthma or knowledge of premature birth.  Current Allergies, Complete Past Medical History, Past Surgical History, Family History, and Social History were reviewed in [employer] record.  ROS  The following are not active complaints unless bolded Hoarseness, sore throat, dysphagia, dental problems, itching, sneezing,  nasal congestion or discharge of excess mucus or purulent secretions, ear ache,   fever, chills, sweats, unintended wt loss or wt gain, classically pleuritic cp,  orthopnea pnd or arm/hand swelling  or leg swelling, presyncope, palpitations, abdominal pain, anorexia, nausea, vomiting, diarrhea  or  change in bowel habits or change in bladder habits, change in stools or change in urine, dysuria, hematuria,  rash, arthralgias, visual complaints, headache, numbness, weakness or ataxia or problems with walking or coordination,  change in mood or  memory.        Current Meds  Medication Sig   acetaminophen (TYLENOL) 500 MG tablet Take 1,000 mg by mouth every 6 (six) hours as needed for moderate pain or headache.   amLODipine (NORVASC) 10 MG tablet TAKE ONE TABLET BY MOUTH EVERYDAY AT BEDTIME   aspirin EC 81 MG tablet Take 81 mg by mouth daily. Swallow whole.   carvedilol (COREG) 6.25 MG tablet TAKE ONE TABLET BY MOUTH BEFORE BREAKFAST and TAKE ONE TABLET BY MOUTH EVERYDAY AT BEDTIME   Cholecalciferol (VITAMIN D3) 50 MCG (2000 UT) TABS TAKE ONE TABLET BY MOUTH daily in addition TO THE 1,000unit FOR total of 3,000units daily   Cholecalciferol 25 MCG (1000 UT) tablet TAKE ONE TABLET BY MOUTH daily along with THE 2,000unit FOR A total of 3,000units daily   lansoprazole (PREVACID) 30 MG capsule Take 30 mg daily before breakfast and you can take additional dose 30mg  before evening meal if needed.   metFORMIN (GLUCOPHAGE) 500 MG tablet Take 500 mg by mouth at bedtime.   mirtazapine (REMERON) 7.5 MG tablet Take 1 tablet (7.5 mg total) by mouth at bedtime. For depression and weight loss.   rosuvastatin (CRESTOR) 20 MG tablet TAKE ONE TABLET BY MOUTH EVERYDAY AT BEDTIME             Past Medical History:  Diagnosis Date   Arteriosclerotic cardiovascular disease (ASCVD) 2001   Bare-metal stent placed in the right coronary artery in 12/01; residual 50% lesion of the first diagonal and mid LAD   Breast cancer [facility])    Cerebrovascular disease    COPD (chronic obstructive pulmonary disease) (HCC)    Cyst, dermoid, arm, left 03/26/2018   Diabetes mellitus    excellent control with a low-dose of a single oral agent   Family history of breast cancer 01/23/2021   Family history of ovarian cancer  01/23/2021   GERD (gastroesophageal reflux disease)    with ulcers   History of right coronary artery stent placement 2000   Hyperlipidemia    Hypertension    Sleep apnea    Thalassemia minor    Tobacco abuse, in remission    35 pack years; Quit in 1980       Objective:      08/25/2022       144   07/17/22 142 lb 6.4 oz (64.6 kg)  07/16/22 144 lb (65.3  kg)  07/03/22 146 lb 6.4 oz (66.4 kg)     Vital signs reviewed  08/25/2022  - Note at rest 02 sats  96% on RA   General appearance:    amb bf nad   HEENT : Oropharynx  clear          NECK :  without  apparent JVD/ palpable Nodes/TM    LUNGS: no acc muscle use,  Nl contour chest which is clear to A and P bilaterally without cough on insp or exp maneuvers   CV:  RRR  no s3 or murmur or increase in P2, and no edema   ABD:  soft and nontender with nl inspiratory excursion in the supine position. No bruits or organomegaly appreciated   MS:  Nl gait/ ext warm without deformities Or obvious joint restrictions  calf tenderness, cyanosis or clubbing    SKIN: warm and dry without lesions    NEURO:  alert, approp, nl sensorium with  no motor or cerebellar deficits apparent.            Assessment

## 2022-08-25 ENCOUNTER — Encounter: Payer: Self-pay | Admitting: Internal Medicine

## 2022-08-25 ENCOUNTER — Ambulatory Visit (INDEPENDENT_AMBULATORY_CARE_PROVIDER_SITE_OTHER): Payer: PPO | Admitting: Internal Medicine

## 2022-08-25 ENCOUNTER — Encounter: Payer: Self-pay | Admitting: Hematology

## 2022-08-25 VITALS — BP 121/68 | HR 53 | Ht 62.5 in | Wt 144.0 lb

## 2022-08-25 DIAGNOSIS — R0609 Other forms of dyspnea: Secondary | ICD-10-CM | POA: Diagnosis not present

## 2022-08-25 DIAGNOSIS — R053 Chronic cough: Secondary | ICD-10-CM | POA: Diagnosis not present

## 2022-08-25 NOTE — Assessment & Plan Note (Signed)
Onset 2008/worse since summer 2023 with bronchiectasis by   Chest  HRCT  10/20/19  - 07/03/2022 try max rx for gerd and pnds with 1st gen H1 blockers per guidelines   - Allergy screen 07/03/2022 >  Eos 0.3 /  IgE  29 - cycilcal cough regimen 07/17/2022 >>> resolved as of 08/25/2022 > continue gerd rx and f/u 61m   Adequate control on present rx, reviewed in detail with pt > no change in rx needed    F/u q 3 m, sooner if needed

## 2022-08-25 NOTE — Patient Instructions (Addendum)
Discuss the chest pain that comes on with exertion with your heart doctor - call immediately if problem worsens   No change in medications needed   Please schedule a follow up visit in 6 months but call sooner if needed

## 2022-08-25 NOTE — Assessment & Plan Note (Signed)
Onset flare since summer 2023 with lots of cough esp hs in pt with known bronchiectasis - 07/03/2022   Walked on RA  x  3  lap(s) =  approx 450  ft  @ mod pace, stopped due to end of study s sob  with lowest 02 sats 95%   Occ cp with exertion - resolved w/in 2 min, thinks it's "indigestion" but not occurring at rest and already on ppi bid ac so rec f/u cards as planned and let them know if any crescendo pattern develops         Each maintenance medication was reviewed in detail including emphasizing most importantly the difference between maintenance and prns and under what circumstances the prns are to be triggered using an action plan format where appropriate.  Total time for H and P, chart review, counseling,   and generating customized AVS unique to this office visit / same day charting = 25 min

## 2022-08-31 DIAGNOSIS — I10 Essential (primary) hypertension: Secondary | ICD-10-CM | POA: Diagnosis not present

## 2022-08-31 DIAGNOSIS — E1165 Type 2 diabetes mellitus with hyperglycemia: Secondary | ICD-10-CM | POA: Diagnosis not present

## 2022-09-09 DIAGNOSIS — E1169 Type 2 diabetes mellitus with other specified complication: Secondary | ICD-10-CM | POA: Diagnosis not present

## 2022-09-09 DIAGNOSIS — E785 Hyperlipidemia, unspecified: Secondary | ICD-10-CM | POA: Diagnosis not present

## 2022-09-09 DIAGNOSIS — F0631 Mood disorder due to known physiological condition with depressive features: Secondary | ICD-10-CM | POA: Diagnosis not present

## 2022-09-09 DIAGNOSIS — F323 Major depressive disorder, single episode, severe with psychotic features: Secondary | ICD-10-CM | POA: Diagnosis not present

## 2022-09-09 DIAGNOSIS — Z6826 Body mass index (BMI) 26.0-26.9, adult: Secondary | ICD-10-CM | POA: Diagnosis not present

## 2022-09-09 DIAGNOSIS — F064 Anxiety disorder due to known physiological condition: Secondary | ICD-10-CM | POA: Diagnosis not present

## 2022-09-09 DIAGNOSIS — R251 Tremor, unspecified: Secondary | ICD-10-CM | POA: Diagnosis not present

## 2022-09-09 DIAGNOSIS — J399 Disease of upper respiratory tract, unspecified: Secondary | ICD-10-CM | POA: Diagnosis not present

## 2022-09-09 DIAGNOSIS — E559 Vitamin D deficiency, unspecified: Secondary | ICD-10-CM | POA: Diagnosis not present

## 2022-09-15 NOTE — Progress Notes (Unsigned)
NEUROLOGY FOLLOW UP OFFICE NOTE  BRIANICA ASEL [id number]  Assessment/Plan:   Tremor - probable essential tremor as it is with action.  Does not exhibit signs or symptoms meeting criteria for Parkinson's disease  Defer starting primidone as not affecting quality of life. As symptoms (hand and foot) are right sided, will check MRI of brain looking for lesion to account for symptoms. Follow up 6 months.  Subjective:  LAMARI PILKENTON is an 82 year old right-handed female with CAD, HTN, HLD, DM II, cerebrovascular disease, Thalassemia minor who follows up for tremor.  Accompanied by her daughter who supplements history.  UPDATE: Since last visit in July 2022, tremors are getting worse.  It affects eating and writing.  Right hand worse than left.  Also right leg tremors as well.  She stumbles but no freezing.     HISTORY: Symptoms started when Covid pandemic happened.  Increased depression.  Nightmares.  Decreased appetite and lost weight.  She reports problems with balance, meaning she stumbles and feels lightheaded.  Being out in the heat and sun aggravates it .  When she is eating sometimes, her hand is shaking if she is holding utensils.  She may notice it in the left hand too. Needs to hold it with her other hand to stop it.  She also reports cramps in feet.     No family history of tremor.    PAST MEDICAL HISTORY: Past Medical History:  Diagnosis Date   Arteriosclerotic cardiovascular disease (ASCVD) 2001   Bare-metal stent placed in the right coronary artery in 12/01; residual 50% lesion of the first diagonal and mid LAD   Breast cancer (HCC)    Cerebrovascular disease    COPD (chronic obstructive pulmonary disease) (HCC)    Cyst, dermoid, arm, left 03/26/2018   Diabetes mellitus    excellent control with a low-dose of a single oral agent   Family history of breast cancer 01/23/2021   Family history of ovarian cancer 01/23/2021   GERD (gastroesophageal reflux disease)     with ulcers   History of right coronary artery stent placement 2000   Hyperlipidemia    Hypertension    Sleep apnea    Thalassemia minor    Tobacco abuse, in remission    35 pack years; Quit in 1980    MEDICATIONS: Current Outpatient Medications on File Prior to Visit  Medication Sig Dispense Refill   acetaminophen (TYLENOL) 500 MG tablet Take 1,000 mg by mouth every 6 (six) hours as needed for moderate pain or headache.     albuterol (VENTOLIN HFA) 108 (90 Base) MCG/ACT inhaler Inhale 2 puffs into the lungs every 6 (six) hours as needed for wheezing or shortness of breath. (Patient not taking: Reported on 08/25/2022) 8 g 2   amLODipine (NORVASC) 10 MG tablet TAKE ONE TABLET BY MOUTH EVERYDAY AT BEDTIME 90 tablet 3   aspirin EC 81 MG tablet Take 81 mg by mouth daily. Swallow whole.     carvedilol (COREG) 6.25 MG tablet TAKE ONE TABLET BY MOUTH BEFORE BREAKFAST and TAKE ONE TABLET BY MOUTH EVERYDAY AT BEDTIME 180 tablet 3   Cholecalciferol (VITAMIN D3) 50 MCG (2000 UT) TABS TAKE ONE TABLET BY MOUTH daily in addition TO THE 1,000unit FOR total of 3,000units daily 30 tablet 1   Cholecalciferol 25 MCG (1000 UT) tablet TAKE ONE TABLET BY MOUTH daily along with THE 2,000unit FOR A total of 3,000units daily 30 tablet 1   irbesartan-hydrochlorothiazide (AVALIDE) 300-12.5 MG tablet  Take 1 tablet by mouth daily before breakfast.     lansoprazole (PREVACID) 30 MG capsule Take 30 mg daily before breakfast and you can take additional dose 30mg  before evening meal if needed. 180 capsule 3   metFORMIN (GLUCOPHAGE) 500 MG tablet Take 500 mg by mouth at bedtime.     mirtazapine (REMERON) 7.5 MG tablet Take 1 tablet (7.5 mg total) by mouth at bedtime. For depression and weight loss. 30 tablet 3   NOREL AD 4-10-325 MG TABS Take 1 tablet by mouth 4 (four) times daily.     Omega-3 Fatty Acids (FISH OIL) 1000 MG CAPS Take 2 capsules by mouth daily. (Patient not taking: Reported on 08/25/2022)     polyethylene  glycol powder (GLYCOLAX/MIRALAX) 17 GM/SCOOP powder Take 1/2 capful daily as needed. Make sure to take a dose if you go more than 48 hours without a bowel movement. 255 g 3   rosuvastatin (CRESTOR) 20 MG tablet TAKE ONE TABLET BY MOUTH EVERYDAY AT BEDTIME 90 tablet 3   triamcinolone ointment (KENALOG) 0.1 % Apply topically 2 (two) times daily as needed. (Patient not taking: Reported on 08/25/2022)     triamcinolone ointment (KENALOG) 0.5 % Apply 1 application  topically 2 (two) times daily as needed (rash). (Patient not taking: Reported on 08/25/2022)     Turmeric 500 MG CAPS Take 500 mg by mouth daily.     No current facility-administered medications on file prior to visit.    ALLERGIES: Allergies  Allergen Reactions   Protonix [Pantoprazole Sodium]     DRY LIPS    FAMILY HISTORY: Family History  Problem Relation Age of Onset   Heart disease Mother        also hypertension and asthma   Lung cancer Mother        dx 45s   Ovarian cancer Mother        dx before 39   Diabetes Half-Sister    Breast cancer Half-Sister 71   Multiple myeloma Nephew 53       war-related exposures   Colon cancer Neg Hx    Colon polyps Neg Hx       Objective:  Blood pressure 138/75, pulse 68, height 5\' 6"  (1.676 m), weight 144 lb 12.8 oz (65.7 kg), SpO2 92 %. General: No acute distress.  Patient appears well-groomed.   Head:  Normocephalic/atraumatic Eyes:  Fundi examined but not visualized Neck: supple, no paraspinal tenderness, full range of motion Heart:  Regular rate and rhythm Neurological Exam: alert and oriented to person, place, and time.  Speech fluent and not dysarthric, language intact.  CN II-XII intact. Bulk and tone normal, no rigidity; muscle strength 5/5 throughout.  No tremor or bradykinesia noted.  No micrographia or tremor noted with writing sentence or Archimedes swirl.  Sensation to light touch intact.  Deep tendon reflexes 2+ throughout, toes downgoing.  Finger to nose testing  intact.  Gait normal posture and stride, slight reduce left arm swing, Romberg negative.   Shon Millet, DO  CC: Renaye Rakers, MD

## 2022-09-16 ENCOUNTER — Ambulatory Visit: Payer: PPO | Admitting: Neurology

## 2022-09-16 ENCOUNTER — Encounter: Payer: Self-pay | Admitting: Neurology

## 2022-09-16 VITALS — BP 138/75 | HR 68 | Ht 66.0 in | Wt 144.8 lb

## 2022-09-16 DIAGNOSIS — R251 Tremor, unspecified: Secondary | ICD-10-CM | POA: Diagnosis not present

## 2022-09-16 NOTE — Patient Instructions (Addendum)
MRI of brain  Follow up in 6 months.

## 2022-09-26 DIAGNOSIS — H47233 Glaucomatous optic atrophy, bilateral: Secondary | ICD-10-CM | POA: Diagnosis not present

## 2022-09-26 DIAGNOSIS — H401131 Primary open-angle glaucoma, bilateral, mild stage: Secondary | ICD-10-CM | POA: Diagnosis not present

## 2022-09-29 ENCOUNTER — Ambulatory Visit
Admission: RE | Admit: 2022-09-29 | Discharge: 2022-09-29 | Disposition: A | Discharge status: Home / Self Care | Payer: PPO | Source: Ambulatory Visit | Attending: Physician Assistant | Admitting: Physician Assistant

## 2022-09-29 DIAGNOSIS — D0511 Intraductal carcinoma in situ of right breast: Secondary | ICD-10-CM

## 2022-09-29 DIAGNOSIS — Z1231 Encounter for screening mammogram for malignant neoplasm of breast: Secondary | ICD-10-CM | POA: Diagnosis not present

## 2022-09-29 DIAGNOSIS — N6091 Unspecified benign mammary dysplasia of right breast: Secondary | ICD-10-CM

## 2022-10-01 ENCOUNTER — Other Ambulatory Visit: Payer: Self-pay | Admitting: Physician Assistant

## 2022-10-01 DIAGNOSIS — R928 Other abnormal and inconclusive findings on diagnostic imaging of breast: Secondary | ICD-10-CM

## 2022-10-03 ENCOUNTER — Ambulatory Visit
Admission: RE | Admit: 2022-10-03 | Discharge: 2022-10-03 | Disposition: A | Discharge status: Home / Self Care | Payer: PPO | Source: Ambulatory Visit | Attending: Neurology | Admitting: Neurology

## 2022-10-03 DIAGNOSIS — R251 Tremor, unspecified: Secondary | ICD-10-CM

## 2022-10-15 ENCOUNTER — Ambulatory Visit
Admission: RE | Admit: 2022-10-15 | Discharge: 2022-10-15 | Disposition: A | Discharge status: Home / Self Care | Payer: PPO | Source: Ambulatory Visit | Attending: Physician Assistant | Admitting: Physician Assistant

## 2022-10-15 ENCOUNTER — Ambulatory Visit: Payer: PPO

## 2022-10-15 DIAGNOSIS — R922 Inconclusive mammogram: Secondary | ICD-10-CM | POA: Diagnosis not present

## 2022-10-15 DIAGNOSIS — R928 Other abnormal and inconclusive findings on diagnostic imaging of breast: Secondary | ICD-10-CM

## 2022-10-15 DIAGNOSIS — N6002 Solitary cyst of left breast: Secondary | ICD-10-CM | POA: Diagnosis not present

## 2022-10-17 ENCOUNTER — Ambulatory Visit: Payer: PPO | Attending: Cardiology | Admitting: Cardiology

## 2022-10-17 ENCOUNTER — Encounter: Payer: Self-pay | Admitting: Cardiology

## 2022-10-17 VITALS — BP 160/92 | HR 82 | Ht 62.5 in | Wt 141.0 lb

## 2022-10-17 DIAGNOSIS — E782 Mixed hyperlipidemia: Secondary | ICD-10-CM | POA: Diagnosis not present

## 2022-10-17 DIAGNOSIS — I10 Essential (primary) hypertension: Secondary | ICD-10-CM | POA: Diagnosis not present

## 2022-10-17 DIAGNOSIS — I2510 Atherosclerotic heart disease of native coronary artery without angina pectoris: Secondary | ICD-10-CM

## 2022-10-17 MED ORDER — IRBESARTAN-HYDROCHLOROTHIAZIDE 300-12.5 MG PO TABS: 1.0000 | ORAL_TABLET | Freq: Every day | ORAL | 3 refills | Status: DC

## 2022-10-17 MED ORDER — CARVEDILOL 6.25 MG PO TABS: ORAL_TABLET | ORAL | 3 refills | Status: DC

## 2022-10-17 MED ORDER — ROSUVASTATIN CALCIUM 20 MG PO TABS: ORAL_TABLET | ORAL | 3 refills | Status: AC

## 2022-10-17 MED ORDER — AMLODIPINE BESYLATE 10 MG PO TABS: ORAL_TABLET | ORAL | 3 refills | Status: AC

## 2022-10-17 NOTE — Progress Notes (Signed)
Clinical Summary Ms. Friesenhahn is a 82 y.o.female seen today for follow up of the following medical problems.   1. CAD   - prior BMS to RCA in 2001 at [facility]   - 02/2011 MPI no ischemia   - echo 04/2013 showed LVEF 60-65% with grade I diastolic dysfunction. - 03/2020 nuclear stress no ischemia    09/2021 echo LVEF 60-65%, grade I dd    - no chest pains - mixed compliance with meds. Had ran out for at least 3 weeks. Just found today.        2. HTN   - lost meds off x 3 weeks.    3. Hyperlpidiemia    - she is on statin   08/2020 TC 123 HDL 51 TG 88 LDL 55 - labs followed by pcp   4. SOB/Abnormal PFTs/Bronchiectasis - former smoker x 30-35 years. - PFTs Jan 2017 with severely redcued DLCO   - now follwoed by Dr Craige Cotta,   5. Anemia -followed by hematology    6. Aortic valve sclerosis - 09/2021 echo with sclerosis, no significant stenosis     SH: has 3 children. 2 grandchilren, one is a Engineer, civil (consulting) working at [facility] in [place]. Randie Heinz grandbaby is on the way in November.        Past Medical History:  Diagnosis Date   Arteriosclerotic cardiovascular disease (ASCVD) 2001   Bare-metal stent placed in the right coronary artery in 12/01; residual 50% lesion of the first diagonal and mid LAD   Breast cancer (HCC)    Cerebrovascular disease    COPD (chronic obstructive pulmonary disease) (HCC)    Cyst, dermoid, arm, left 03/26/2018   Diabetes mellitus    excellent control with a low-dose of a single oral agent   Family history of breast cancer 01/23/2021   Family history of ovarian cancer 01/23/2021   GERD (gastroesophageal reflux disease)    with ulcers   History of right coronary artery stent placement 2000   Hyperlipidemia    Hypertension    Sleep apnea    Thalassemia minor    Tobacco abuse, in remission    35 pack years; Quit in 1980     Allergies  Allergen Reactions   Protonix [Pantoprazole Sodium]     DRY LIPS     Current Outpatient  Medications  Medication Sig Dispense Refill   acetaminophen (TYLENOL) 500 MG tablet Take 1,000 mg by mouth every 6 (six) hours as needed for moderate pain or headache.     amLODipine (NORVASC) 10 MG tablet TAKE ONE TABLET BY MOUTH EVERYDAY AT BEDTIME 90 tablet 3   aspirin EC 81 MG tablet Take 81 mg by mouth daily. Swallow whole.     carvedilol (COREG) 6.25 MG tablet TAKE ONE TABLET BY MOUTH BEFORE BREAKFAST and TAKE ONE TABLET BY MOUTH EVERYDAY AT BEDTIME 180 tablet 3   Cholecalciferol (VITAMIN D3) 50 MCG (2000 UT) TABS TAKE ONE TABLET BY MOUTH daily in addition TO THE 1,000unit FOR total of 3,000units daily 30 tablet 1   Cholecalciferol 25 MCG (1000 UT) tablet TAKE ONE TABLET BY MOUTH daily along with THE 2,000unit FOR A total of 3,000units daily 30 tablet 1   irbesartan-hydrochlorothiazide (AVALIDE) 300-12.5 MG tablet Take 1 tablet by mouth daily before breakfast.     lansoprazole (PREVACID) 30 MG capsule Take 30 mg daily before breakfast and you can take additional dose 30mg  before evening meal if needed. 180 capsule 3   metFORMIN (  GLUCOPHAGE) 500 MG tablet Take 500 mg by mouth at bedtime.     mirtazapine (REMERON) 7.5 MG tablet Take 1 tablet (7.5 mg total) by mouth at bedtime. For depression and weight loss. 30 tablet 3   polyethylene glycol powder (GLYCOLAX/MIRALAX) 17 GM/SCOOP powder Take 1/2 capful daily as needed. Make sure to take a dose if you go more than 48 hours without a bowel movement. 255 g 3   rosuvastatin (CRESTOR) 20 MG tablet TAKE ONE TABLET BY MOUTH EVERYDAY AT BEDTIME 90 tablet 3   triamcinolone ointment (KENALOG) 0.1 % Apply topically 2 (two) times daily as needed.     triamcinolone ointment (KENALOG) 0.5 % Apply 1 application  topically 2 (two) times daily as needed (rash).     Turmeric 500 MG CAPS Take 500 mg by mouth daily.     No current facility-administered medications for this visit.     Past Surgical History:  Procedure Laterality Date   BIOPSY  02/28/2020    Procedure: BIOPSY;  Surgeon: Lanelle Bal, DO;  Location: [facility];  Service: Endoscopy;;   COLONOSCOPY  2006   Dr. Loreta Ave: normal   COLONOSCOPY  2000   Dr. Tad Moore: normal    COLONOSCOPY N/A 03/03/2016   Dr. Darrick Penna: diverticulosis, non-bleeding hemorrhoids, redundant left colon.   DILATION AND CURETTAGE OF UTERUS     ESOPHAGOGASTRODUODENOSCOPY  2000   Dr. Tad Moore: gastritis    ESOPHAGOGASTRODUODENOSCOPY  2006   Dr. Loreta Ave: small hiatal hernia, gastritis, negative H.pylori    ESOPHAGOGASTRODUODENOSCOPY N/A 03/03/2016   Procedure: ESOPHAGOGASTRODUODENOSCOPY (EGD);  Surgeon: West Bali, MD;  Location: [facility];  Service: Endoscopy;  Laterality: N/A;   ESOPHAGOGASTRODUODENOSCOPY N/A 06/03/2016   Dr. Darrick Penna: nonaggressive gastritis due to aspirin use. Previous ulcers had healed.   ESOPHAGOGASTRODUODENOSCOPY (EGD) WITH PROPOFOL N/A 02/28/2020   focal inflammation in the gastric antrum consistent with gastritis on biopsies. No H.pylori.    EXCISION OF KELOID Left 03/26/2018   Procedure: EXCISION OF LEFT ARM MASS;  Surgeon: Claud Kelp, MD;  Location: [facility];  Service: General;  Laterality: Left;   GIVENS CAPSULE STUDY N/A 10/10/2020   Procedure: GIVENS CAPSULE STUDY;  Surgeon: Lanelle Bal, DO;  Location: [facility];  Service: Endoscopy;  Laterality: N/A;  7:30am   INGUINAL HERNIA REPAIR  1970s   Right   TOTAL MASTECTOMY Right 02/20/2021   Procedure: RIGHT TOTAL MASTECTOMY;  Surgeon: Emelia Loron, MD;  Location: [facility] OR;  Service: General;  Laterality: Right;   VAGINAL HYSTERECTOMY  1980   Unilateral oophorectomy     Allergies  Allergen Reactions   Protonix [Pantoprazole Sodium]     DRY LIPS      Family History  Problem Relation Age of Onset   Heart disease Mother        also hypertension and asthma   Lung cancer Mother        dx 26s   Ovarian cancer Mother        dx before 49   Diabetes Half-Sister    Breast cancer Half-Sister 70    Multiple myeloma Nephew 53       war-related exposures   Colon cancer Neg Hx    Colon polyps Neg Hx      Social History Ms. Easterday reports that she quit smoking about 42 years ago. Her smoking use included cigarettes. She started smoking about 57 years ago. She has a 18.00 pack-year smoking history. She has never used smokeless tobacco. Ms.  Silveria reports no history of alcohol use.   Review of Systems CONSTITUTIONAL: No weight loss, fever, chills, weakness or fatigue.  HEENT: Eyes: No visual loss, blurred vision, double vision or yellow sclerae.No hearing loss, sneezing, congestion, runny nose or sore throat.  SKIN: No rash or itching.  CARDIOVASCULAR: per hpi RESPIRATORY: No shortness of breath, cough or sputum.  GASTROINTESTINAL: No anorexia, nausea, vomiting or diarrhea. No abdominal pain or blood.  GENITOURINARY: No burning on urination, no polyuria NEUROLOGICAL: No headache, dizziness, syncope, paralysis, ataxia, numbness or tingling in the extremities. No change in bowel or bladder control.  MUSCULOSKELETAL: No muscle, back pain, joint pain or stiffness.  LYMPHATICS: No enlarged nodes. No history of splenectomy.  PSYCHIATRIC: No history of depression or anxiety.  ENDOCRINOLOGIC: No reports of sweating, cold or heat intolerance. No polyuria or polydipsia.  Marland Kitchen   Physical Examination Today's Vitals   10/17/22 0937  BP: (!) 160/92  Pulse: 82  SpO2: 98%  Weight: 141 lb (64 kg)  Height: 5' 2.5" (1.588 m)   Body mass index is 25.38 kg/m.  Gen: resting comfortably, no acute distress HEENT: no scleral icterus, pupils equal round and reactive, no palptable cervical adenopathy,  CV: RRR, 2/6 systolic murmur rusb, no jvd Resp: Clear to auscultation bilaterally GI: abdomen is soft, non-tender, non-distended, normal bowel sounds, no hepatosplenomegaly MSK: extremities are warm, no edema.  Skin: warm, no rash Neuro:  no focal deficits Psych: appropriate affect   Diagnostic  Studies  02/2011 MPI   Low risk exercise/Lexiscan Myoview as outlined. Equivocal ST- segment changes were noted in the setting of lead motion artifact. No chest pain reported. Perfusion imaging is most consistent with breast attenuation without clear evidence of scar or ischemia. LVEF 77%.   04/06/13 Clinic EKG: sinus rhythm, nomral axis, normal intervals, non-specific ST/T changes   04/2013 Echo LVEF 60-65%, grade I diastolic dysfunction,   Jan 2017 PFTs: normal spirometry, minimal restrictive changes, severely decreased DLCO   09/2021 echo 1. Left ventricular ejection fraction, by estimation, is 60 to 65%. The  left ventricle has normal function. The left ventricle has no regional  wall motion abnormalities. Left ventricular diastolic parameters are  consistent with Grade I diastolic  dysfunction (impaired relaxation). The average left ventricular global  longitudinal strain is -18.1 %. The global longitudinal strain is normal.   2. Right ventricular systolic function is normal. The right ventricular  size is normal. There is normal pulmonary artery systolic pressure. The  estimated right ventricular systolic pressure is 17.6 mmHg.   3. The mitral valve is grossly normal. Trivial mitral valve  regurgitation.   4. The aortic valve was not well visualized. Aortic valve regurgitation  is not visualized. Aortic valve sclerosis/calcification is present,  without any evidence of aortic stenosis. Aortic valve mean gradient  measures 4.0 mmHg.   5. The inferior vena cava is normal in size with greater than 50%  respiratory variability, suggesting right atrial pressure of 3 mmHg.     Assessment and Plan   1. CAD -no symptoms, continue current meds  EKG SR today, no acute ischemic changes   2. HTN   -elevated today but has been off meds x 3 weeks. Restart home bp meds   3. Hyperlipidemia   - restart home statin, labs per pcp      Antoine Poche, M.D.

## 2022-10-17 NOTE — Patient Instructions (Signed)
Medication Instructions:  Your physician recommends that you continue on your current medications as directed. Please refer to the Current Medication list given to you today.  *If you need a refill on your cardiac medications before your next appointment, please call your pharmacy*   Lab Work: None If you have labs (blood work) drawn today and your tests are completely normal, you will receive your results only by: MyChart Message (if you have MyChart) OR A paper copy in the mail If you have any lab test that is abnormal or we need to change your treatment, we will call you to review the results.   Testing/Procedures: None   Follow-Up: At  [facility], you and your health needs are our priority.  As part of our continuing mission to provide you with exceptional heart care, we have created designated Provider Care Teams.  These Care Teams include your primary Cardiologist (physician) and Advanced Practice Providers (APPs -  Physician Assistants and Nurse Practitioners) who all work together to provide you with the care you need, when you need it.  We recommend signing up for the patient portal called "MyChart".  Sign up information is provided on this After Visit Summary.  MyChart is used to connect with patients for Virtual Visits (Telemedicine).  Patients are able to view lab/test results, encounter notes, upcoming appointments, etc.  Non-urgent messages can be sent to your provider as well.   To learn more about what you can do with MyChart, go to [email or web address].    Your next appointment:   6 month(s)  Provider:   Jonathan Branch, MD    Other Instructions    

## 2022-10-20 DIAGNOSIS — E1169 Type 2 diabetes mellitus with other specified complication: Secondary | ICD-10-CM | POA: Diagnosis not present

## 2022-10-20 DIAGNOSIS — E782 Mixed hyperlipidemia: Secondary | ICD-10-CM | POA: Diagnosis not present

## 2022-10-20 DIAGNOSIS — E649 Sequelae of unspecified nutritional deficiency: Secondary | ICD-10-CM | POA: Diagnosis not present

## 2022-10-20 DIAGNOSIS — R634 Abnormal weight loss: Secondary | ICD-10-CM | POA: Diagnosis not present

## 2022-10-20 DIAGNOSIS — I119 Hypertensive heart disease without heart failure: Secondary | ICD-10-CM | POA: Diagnosis not present

## 2022-10-20 DIAGNOSIS — R251 Tremor, unspecified: Secondary | ICD-10-CM | POA: Diagnosis not present

## 2022-10-24 ENCOUNTER — Other Ambulatory Visit: Payer: Self-pay | Admitting: Gastroenterology

## 2022-10-28 ENCOUNTER — Other Ambulatory Visit: Payer: Self-pay | Admitting: Physician Assistant

## 2022-10-28 DIAGNOSIS — R634 Abnormal weight loss: Secondary | ICD-10-CM

## 2022-10-28 DIAGNOSIS — F32A Depression, unspecified: Secondary | ICD-10-CM

## 2022-10-31 DIAGNOSIS — E1165 Type 2 diabetes mellitus with hyperglycemia: Secondary | ICD-10-CM | POA: Diagnosis not present

## 2022-10-31 DIAGNOSIS — I10 Essential (primary) hypertension: Secondary | ICD-10-CM | POA: Diagnosis not present

## 2022-10-31 DIAGNOSIS — E785 Hyperlipidemia, unspecified: Secondary | ICD-10-CM | POA: Diagnosis not present

## 2022-10-31 NOTE — Progress Notes (Unsigned)
[facility] [address], [place] [address]   CLINIC:  Medical Oncology/Hematology  PCP:  Renaye Rakers, MD [address] / [place] [address] [phone]   REASON FOR VISIT:  Follow-up for history of multifocal right breast DCIS + iron deficiency anemia  PRIOR THERAPY: S/p right-sided mastectomy (02/20/2021)  CURRENT THERAPY: Surveillance of breast cancer with intermittent IV iron  BRIEF ONCOLOGIC HISTORY:   Oncology History  Ductal carcinoma in situ (DCIS) of right breast  01/01/2021 Mammogram   FINDINGS: Multiple circumscribed oval masses are noted in the MEDIAL portion of the RIGHT breast. Masses vary from 1-4 millimeters in diameter and appear contiguous, spanning 8.9 x 3.6 x 5.1 centimeters. There are faint calcifications spanning 4.2 x 1.9 x 3.5 centimeters in this same, segmental distribution.   01/14/2021 Cancer Staging   Staging form: Breast, AJCC 8th Edition - Clinical stage from 01/14/2021: Stage 0 (cTis (DCIS), cN0, cM0, G2, ER+, PR+, HER2: Not Assessed) - Signed by Malachy Mood, MD on 01/21/2021 Stage prefix: Initial diagnosis Histologic grading system: 3 grade system   01/14/2021 Pathology Results   Diagnosis 1. Breast, right, needle core biopsy, anterior extent - DUCTAL CARCINOMA IN SITU, INTERMEDIATE GRADE WITH CALCIFICATIONS. SEE NOTE 2. Breast, right, needle core biopsy, posterior extent - DUCTAL CARCINOMA IN SITU, INTERMEDIATE GRADE WITH CALCIFICATIONS. SEE NOTE Diagnosis Note 1. and 2. DCIS measures 0.6 cm in part 1 and 0.4 cm in part 2.  1. PROGNOSTIC INDICATORS Results: IMMUNOHISTOCHEMICAL AND MORPHOMETRIC ANALYSIS PERFORMED MANUALLY Estrogen Receptor: >95%, POSITIVE, STRONG STAINING INTENSITY Progesterone Receptor: >95%, POSITIVE, STRONG STAINING INTENSITY     01/17/2021 Initial Diagnosis   Ductal carcinoma in situ (DCIS) of right breast   02/05/2021 Genetic Testing   Negative hereditary cancer genetic testing: no  pathogenic variants detected in [facility] CustomNext-Cancer +RNAinsight Panel.  The report date is February 05, 2021.   The CustomNext-Cancer+RNAinsight panel offered by [employer] [facility] includes sequencing and rearrangement analysis for the following 47 genes:  APC, ATM, AXIN2, BARD1, BMPR1A, BRCA1, BRCA2, BRIP1, CDH1, CDK4, CDKN2A, CHEK2, DICER1, EPCAM, GREM1, HOXB13, MEN1, MLH1, MSH2, MSH3, MSH6, MUTYH, NBN, NF1, NF2, NTHL1, PALB2, PMS2, POLD1, POLE, PTEN, RAD51C, RAD51D, RECQL, RET, SDHA, SDHAF2, SDHB, SDHC, SDHD, SMAD4, SMARCA4, STK11, TP53, TSC1, TSC2, and VHL.  RNA data is routinely analyzed for use in variant interpretation for all genes.   02/20/2021 Cancer Staging   Staging form: Breast, AJCC 8th Edition - Pathologic stage from 02/20/2021: No Stage Recommended (pTis (DCIS), pNX, cM1, G2, ER+, PR+, HER2: Not Assessed) - Signed by Malachy Mood, MD on 03/12/2021 Stage prefix: Initial diagnosis Histologic grading system: 3 grade system Residual tumor (R): R0 - None   02/20/2021 Definitive Surgery   FINAL MICROSCOPIC DIAGNOSIS:   A. BREAST, RIGHT, MASTECTOMY:  - Multifocal intermediate grade ductal carcinoma in situ with calcifications.  - Atypical lobular hyperplasia.  - Margins are negative for carcinoma.  - Biopsy site (x2).  - See oncology table.      CANCER STAGING:  Cancer Staging  Ductal carcinoma in situ (DCIS) of right breast Staging form: Breast, AJCC 8th Edition - Clinical stage from 01/14/2021: Stage 0 (cTis (DCIS), cN0, cM0, G2, ER+, PR+, HER2: Not Assessed) - Signed by Malachy Mood, MD on 01/21/2021 - Pathologic stage from 02/20/2021: No Stage Recommended (pTis (DCIS), pNX, cM1, G2, ER+, PR+, HER2: Not Assessed) - Signed by Malachy Mood, MD on 03/12/2021   INTERVAL HISTORY:   Nancy Liu, a 82 y.o.  female, returns for routine follow-up of her iron deficiency anemia and breast cancer. [name] was last evaluated via telemedicine visit by Rojelio Brenner PA-C on 04/08/2022.   Patient history somewhat difficult to follow as her thoughts are scattered and tangential.  BREAST CANCER HISTORY:   She denies any symptoms of recurrence such as new lumps, bone pain,or abdominal pain.  She has no new headaches, seizures, or focal neurologic deficits.  No B symptoms such as fever or night sweats.  Progressive weight loss addressed below.    IRON DEFICIENCY ANEMIA:  She is taking iron tablet once daily.  She continues to have chronically dark bowel movements after starting iron pills, but denies any gross melena or hematochezia. She reports significant fatigue. She has some chronic dyspnea on exertion.  She reports occasional chest pain, but denies any chest pain today; she follows with cardiology annually and has been advised to speak to them about her treatment and chest pain.  She denies any pica, restless legs, headache, lightheadedness, or syncope.  She frequently "feels cold."  VITAMIN D DEFICIENCY: She is taking vitamin D 3000 units daily.  DEPRESSION: Her depression is being managed by her PCP.  She was started on citalopram 10 mg daily, but reports that she stopped taking this for unknown reasons.  She is unclear as to whether or not this was helping her mood.  She continues to report low energy, poor motivation, and low appetite.  She attributes this to her diagnosis of breast cancer and isolation related to the COVID-19 pandemic.  She is trying to find a licensed therapist with the assistance of her primary care provider.  In the interim, she has been seeing her pastor's wife for spiritual counseling.  She found this helpful, but stopped going to counseling because she was afraid that "she was a burden."  She reports feeling stressed and feeling that she is forgetting things.  She denies any thoughts of self-harm or suicidal ideation/intent.  WEIGHT LOSS: Her weight today is 139 pounds, and she has lost about 10 pounds in the past 6 months.  She reports very low appetite and  little interest in eating.  She drinks at least 2 Ensure each day.  She reports 40% energy and little to no appetite.  She is maintaining stable weight at this time.  ASSESSMENT & PLAN:  1.  Multifocal right breast DCIS, grade 2, ER/PR+, and ALH  - She was initially managed by Dr. Mosetta Putt at [facility].  Transferred care to [facility] due to closer proximity to home.   - S/p right mastectomy 02/20/21 by Dr. Dwain Sarna. Path showed multifocal intermediate grade DCIS, surgical margins were negative.  It also showed atypical lobular hyperplasia [facility]), which is a high risk for future breast cancer. - Due to her advanced age and potential side effects, she did not proceed with anti-estrogen therapy  - Left mammogram from April 2023 was negative. - MRI breast (05/05/2022): Two  5-6 mm enhancing foci in the left breast, BI-RADS Category 4, suspicious - MRI guided left breast biopsy (05/12/2022): Pathology revealed benign breast parenchyma with sclerosing adenosis, fibrocystic change, apocrine metaplasia and microcalcification - Physical exam showed right mastectomy site within normal limits.  Left nipple retraction is chronic (see note by NP Santiago Glad from 01/15/2022).  No discrete nodules, masses, or lymphadenopathy palpated. - Labs (07/23/2022) nonconcerning for recurrent breast cancer - History/ROS negative for any "red flag" symptoms of recurrence  - No clinical concern for new  or recurrent breast cancer at this time.  - PLAN: Continue surveillance with high risk screening protocol to include annual left mammogram and annual breast MRI, staggered every 6 months for the next 5 years. Breast MRI due October 2024 Left mammogram due April 2024 - Office visit and physical exam every 6 months - We will plan on office visit in February 2023 with labs and physical exam   2.  Microcytic anemia from iron deficiency - Secondary to iron deficiency, which is most likely due to small  bowel AVMs.  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 - Patient has no M spike on SPEP. - She was Hemoccult stool negative in March 2022. - GI capsule study in May 2022 with few gastric and small bowel erosions and possible small bowel AVM without any active bleeding. - EGD (02/28/2020): Gastritis, otherwise normal - Colonoscopy (03/03/2016): Diverticulosis, nonbleeding internal hemorrhoids - She last received Feraheme on 01/30/2022. - She is taking iron tablet once daily - She continues to have chronic fatigue.     - Most recent labs (07/23/2022): WBC 14.6/ANC 12.9.  Normal Hgb 13.4/MCV 80.6.  Ferritin 160, iron saturation 21%.  (NOTE: Leukocytosis likely secondary to Depo-Medrol shot received on 07/03/2022) - PLAN: No indication for IV iron at this time.  Continue daily iron supplementation. - Repeat labs and RTC in 6 months (attention to elevated WBC at follow-up)    3.  Vitamin D deficiency - Patient was previously taking vitamin D 50,000 units weekly, noted to have elevated vitamin D 133.41 (05/13/2021), therefore vitamin D was held. - She is currently taking vitamin D 3000 units daily  - Most recent vitamin D (07/23/2022): Normal at 54.02  - PLAN: Continue taking vitamin D 3000 units daily.  Recheck at follow-up in 6 months.   4.  Depression and weight loss - Reports worsening depression over the past several years - Decreased appetite and oral intake secondary to depression - Started on citalopram 10 mg daily by PCP, but patient stopped taking this for unknown reason. - Patient is in the process of finding therapist.  She was seeing her pastor's wife for spiritual counseling, but stopped going because she "was afraid she was a burden." - Oral intake is about 50% of previous oral intake, she is drinking Ensure twice daily - She denies any suicidal ideation/intent or thoughts of self-harm - Weight today is down 10 pounds in the past 6 months -  PLAN: Advised patient to speak with her PCP regarding restarting citalopram or other agent.  At today's visit, patient is not interested in restarting medications. - Advised patient to continue counseling with her pastor's wife and to seek additional help from a licensed clinical therapist. - Patient advised to seek immediate medical attention if she had any thoughts of self-harm or suicidal ideation/intent.  (Denies at today's visit) - Continue at least 2 Ensure daily.  Encourage small frequent meals throughout the day. - Prescription to pharmacy for Marinol 2.5 mg twice daily as appetite stimulant. - We will see her for office visit and weight check in 3 months.  Consider additional workup if she has weight loss out of proportion to nutritional intake.  5.  Bone health  - DEXA scan 09/12/21 showed mild osteopenia, lowest T score -1.2 at the R femur neck - PLAN: Continue calcium, vitamin D, and weightbearing exercise.  PLAN SUMMARY: >> Diagnostic mammogram (left breast) in April 2024 >> Office visit in 3 months for  weight check (no labs needed)    REVIEW OF SYSTEMS:   Review of Systems  Constitutional:  Positive for appetite change, fatigue and unexpected weight change. Negative for chills, diaphoresis and fever.  HENT:   Negative for lump/mass and nosebleeds.   Eyes:  Negative for eye problems.  Respiratory:  Positive for cough and shortness of breath. Negative for hemoptysis.   Cardiovascular:  Positive for chest pain (Intermittently, none today). Negative for leg swelling and palpitations.  Gastrointestinal:  Negative for abdominal pain, blood in stool, constipation, diarrhea, nausea and vomiting.  Genitourinary:  Negative for hematuria.   Skin: Negative.   Neurological:  Positive for numbness. Negative for dizziness, headaches and light-headedness.  Hematological:  Does not bruise/bleed easily.  Psychiatric/Behavioral:  Positive for depression.     PHYSICAL EXAM:   Performance  status (ECOG): 1 - Symptomatic but completely ambulatory  There were no vitals filed for this visit. Wt Readings from Last 3 Encounters:  10/17/22 141 lb (64 kg)  09/16/22 144 lb 12.8 oz (65.7 kg)  08/25/22 144 lb (65.3 kg)   Physical Exam Constitutional:      Appearance: Normal appearance.  HENT:     Head: Normocephalic and atraumatic.     Mouth/Throat:     Mouth: Mucous membranes are moist.  Eyes:     Extraocular Movements: Extraocular movements intact.     Pupils: Pupils are equal, round, and reactive to light.  Cardiovascular:     Rate and Rhythm: Normal rate and regular rhythm.     Pulses: Normal pulses.     Heart sounds: Normal heart sounds.  Pulmonary:     Effort: Pulmonary effort is normal.     Breath sounds: Normal breath sounds.  Chest:       Comments: Right mastectomy site within normal limits.  Left nipple inversion is chronic and at baseline.  No discrete nodules, masses, or lymphadenopathy palpated. Abdominal:     General: Bowel sounds are normal.     Palpations: Abdomen is soft.     Tenderness: There is no abdominal tenderness.  Musculoskeletal:        General: No swelling.     Right lower leg: No edema.     Left lower leg: No edema.  Lymphadenopathy:     Cervical: No cervical adenopathy.     Upper Body:     Right upper body: No supraclavicular, axillary or pectoral adenopathy.     Left upper body: No supraclavicular, axillary or pectoral adenopathy.  Skin:    General: Skin is warm and dry.  Neurological:     General: No focal deficit present.     Mental Status: She is alert and oriented to person, place, and time.  Psychiatric:        Mood and Affect: Mood normal.        Behavior: Behavior normal.     Comments: Thoughts seem somewhat scattered and tangential.     PAST MEDICAL/SURGICAL HISTORY:  Past Medical History:  Diagnosis Date   Arteriosclerotic cardiovascular disease (ASCVD) 2001   Bare-metal stent placed in the right coronary artery in  12/01; residual 50% lesion of the first diagonal and mid LAD   Breast cancer (HCC)    Cerebrovascular disease    COPD (chronic obstructive pulmonary disease) (HCC)    Cyst, dermoid, arm, left 03/26/2018   Diabetes mellitus    excellent control with a low-dose of a single oral agent   Family history of breast cancer 01/23/2021   Family  history of ovarian cancer 01/23/2021   GERD (gastroesophageal reflux disease)    with ulcers   History of right coronary artery stent placement 2000   Hyperlipidemia    Hypertension    Sleep apnea    Thalassemia minor    Tobacco abuse, in remission    35 pack years; Quit in 1980   Past Surgical History:  Procedure Laterality Date   BIOPSY  02/28/2020   Procedure: BIOPSY;  Surgeon: Lanelle Bal, DO;  [facility];  Service: Endoscopy;;   COLONOSCOPY  2006   Dr. Loreta Ave: normal   COLONOSCOPY  2000   Dr. Tad Moore: normal    COLONOSCOPY N/A 03/03/2016   Dr. Darrick Penna: diverticulosis, non-bleeding hemorrhoids, redundant left colon.   DILATION AND CURETTAGE OF UTERUS     ESOPHAGOGASTRODUODENOSCOPY  2000   Dr. Tad Moore: gastritis    ESOPHAGOGASTRODUODENOSCOPY  2006   Dr. Loreta Ave: small hiatal hernia, gastritis, negative H.pylori    ESOPHAGOGASTRODUODENOSCOPY N/A 03/03/2016   Procedure: ESOPHAGOGASTRODUODENOSCOPY (EGD);  Surgeon: West Bali, MD;  Location: [facility];  Service: Endoscopy;  Laterality: N/A;   ESOPHAGOGASTRODUODENOSCOPY N/A 06/03/2016   Dr. Darrick Penna: nonaggressive gastritis due to aspirin use. Previous ulcers had healed.   ESOPHAGOGASTRODUODENOSCOPY (EGD) WITH PROPOFOL N/A 02/28/2020   focal inflammation in the gastric antrum consistent with gastritis on biopsies. No H.pylori.    EXCISION OF KELOID Left 03/26/2018   Procedure: EXCISION OF LEFT ARM MASS;  Surgeon: Claud Kelp, MD;  Location: [facility];  Service: General;  Laterality: Left;   GIVENS CAPSULE STUDY N/A 10/10/2020   Procedure: GIVENS CAPSULE STUDY;   Surgeon: Lanelle Bal, DO;  Location: [facility];  Service: Endoscopy;  Laterality: N/A;  7:30am   INGUINAL HERNIA REPAIR  1970s   Right   TOTAL MASTECTOMY Right 02/20/2021   Procedure: RIGHT TOTAL MASTECTOMY;  Surgeon: Emelia Loron, MD;  Location[facility];  Service: General;  Laterality: Right;   VAGINAL HYSTERECTOMY  1980   Unilateral oophorectomy    SOCIAL HISTORY:  Social History   Socioeconomic History   Marital status: Divorced    Spouse name: Not on file   Number of children: 3   Years of education: Not on file   Highest education level: Not on file  Occupational History   Occupation: Retired    Comment: Factory work  Tobacco Use   Smoking status: Former    Packs/day: 1.00    Years: 18.00    Additional pack years: 0.00    Total pack years: 18.00    Types: Cigarettes    Start date: 06/02/1965    Quit date: 06/09/1980    Years since quitting: 42.4   Smokeless tobacco: Never  Vaping Use   Vaping Use: Never used  Substance and Sexual Activity   Alcohol use: No    Alcohol/week: 0.0 standard drinks of alcohol   Drug use: No   Sexual activity: Not Currently  Other Topics Concern   Not on file  Social History Narrative   Lives with daughter    Social Determinants of Health   Financial Resource Strain: Low Risk  (06/04/2021)   Overall Financial Resource Strain (CARDIA)    Difficulty of Paying Living Expenses: Not hard at all  Food Insecurity: No Food Insecurity (01/23/2021)   Hunger Vital Sign    Worried About Running Out of Food in the Last Year: Never true    Ran Out of Food in the Last Year: Never true  Transportation  Needs: No Transportation Needs (01/23/2021)   PRAPARE - Administrator, Civil Service (Medical): No    Lack of Transportation (Non-Medical): No  Physical Activity: Inactive (06/04/2021)   Exercise Vital Sign    Days of Exercise per Week: 0 days    Minutes of Exercise per Session: 0 min  Stress: Stress Concern Present (06/04/2021)    [employer] of Occupational Health - Occupational Stress Questionnaire    Feeling of Stress : To some extent  Social Connections: Moderately Isolated (06/04/2021)   Social Connection and Isolation Panel [NHANES]    Frequency of Communication with Friends and Family: More than three times a week    Frequency of Social Gatherings with Friends and Family: More than three times a week    Attends Religious Services: More than 4 times per year    Active Member of [employer] or Organizations: No    Attends Banker Meetings: Never    Marital Status: Divorced  Catering manager Violence: Not At Risk (06/04/2021)   Humiliation, Afraid, Rape, and Kick questionnaire    Fear of Current or Ex-Partner: No    Emotionally Abused: No    Physically Abused: No    Sexually Abused: No    FAMILY HISTORY:  Family History  Problem Relation Age of Onset   Heart disease Mother        also hypertension and asthma   Lung cancer Mother        dx 26s   Ovarian cancer Mother        dx before 38   Diabetes Half-Sister    Breast cancer Half-Sister 74   Multiple myeloma Nephew 53       war-related exposures   Colon cancer Neg Hx    Colon polyps Neg Hx     CURRENT MEDICATIONS:  Current Outpatient Medications  Medication Sig Dispense Refill   acetaminophen (TYLENOL) 500 MG tablet Take 1,000 mg by mouth every 6 (six) hours as needed for moderate pain or headache.     amLODipine (NORVASC) 10 MG tablet TAKE ONE TABLET BY MOUTH EVERYDAY AT BEDTIME 90 tablet 3   aspirin EC 81 MG tablet Take 81 mg by mouth daily. Swallow whole.     carvedilol (COREG) 6.25 MG tablet TAKE ONE TABLET BY MOUTH BEFORE BREAKFAST and TAKE ONE TABLET BY MOUTH EVERYDAY AT BEDTIME 180 tablet 3   Cholecalciferol (VITAMIN D3) 50 MCG (2000 UT) TABS TAKE ONE TABLET BY MOUTH daily in addition TO THE 1,000unit FOR total of 3,000units daily 30 tablet 1   Cholecalciferol 25 MCG (1000 UT) tablet TAKE ONE TABLET BY MOUTH daily along with  THE 2,000unit FOR A total of 3,000units daily 30 tablet 1   irbesartan-hydrochlorothiazide (AVALIDE) 300-12.5 MG tablet Take 1 tablet by mouth daily before breakfast. 90 tablet 3   lansoprazole (PREVACID) 30 MG capsule TAKE ONE CAPSULE BY MOUTH EVERY MORNING 180 capsule 3   metFORMIN (GLUCOPHAGE) 500 MG tablet Take 500 mg by mouth at bedtime.     mirtazapine (REMERON) 7.5 MG tablet Take 1 tablet (7.5 mg total) by mouth at bedtime. For depression and weight loss. 30 tablet 3   polyethylene glycol powder (GLYCOLAX/MIRALAX) 17 GM/SCOOP powder Take 1/2 capful daily as needed. Make sure to take a dose if you go more than 48 hours without a bowel movement. 255 g 3   rosuvastatin (CRESTOR) 20 MG tablet TAKE ONE TABLET BY MOUTH EVERYDAY AT BEDTIME 90 tablet 3   triamcinolone ointment (KENALOG)  0.1 % Apply topically 2 (two) times daily as needed.     triamcinolone ointment (KENALOG) 0.5 % Apply 1 application  topically 2 (two) times daily as needed (rash).     Turmeric 500 MG CAPS Take 500 mg by mouth daily.     No current facility-administered medications for this visit.    ALLERGIES:  Allergies  Allergen Reactions   Protonix [Pantoprazole Sodium]     DRY LIPS    LABORATORY DATA:  I have reviewed the labs as listed.     Latest Ref Rng & Units 07/23/2022    1:03 PM 07/03/2022    2:11 PM 01/17/2022    9:33 AM  CBC  WBC 4.0 - 10.5 K/uL 14.6  7.8  7.3   Hemoglobin 12.0 - 15.0 g/dL 29.5  62.1  30.8   Hematocrit 36.0 - 46.0 % 41.5  37.6  38.8   Platelets 150 - 400 K/uL 296  282  250       Latest Ref Rng & Units 07/23/2022    1:03 PM 07/03/2022    2:11 PM 01/23/2021    7:45 AM  CMP  Glucose 70 - 99 mg/dL 657  86  846   BUN 8 - 23 mg/dL 29  20  21    Creatinine 0.44 - 1.00 mg/dL 9.62  9.52  8.41   Sodium 135 - 145 mmol/L 131  141  142   Potassium 3.5 - 5.1 mmol/L 4.2  4.4  4.2   Chloride 98 - 111 mmol/L 94  100  101   CO2 22 - 32 mmol/L 27  26  29    Calcium 8.9 - 10.3 mg/dL 8.9  9.2  32.4    Total Protein 6.5 - 8.1 g/dL 6.7   7.4   Total Bilirubin 0.3 - 1.2 mg/dL 0.7   0.6   Alkaline Phos 38 - 126 U/L 55   65   AST 15 - 41 U/L 27   24   ALT 0 - 44 U/L 23   17     DIAGNOSTIC IMAGING:  I have independently reviewed the scans and discussed with the patient. MM 3D DIAGNOSTIC MAMMOGRAM UNILATERAL LEFT BREAST  Result Date: 10/15/2022 CLINICAL DATA:  Recall from screening to evaluate a possible left breast mass. Benign MRI guided left breast biopsy x2 sites December 2023. EXAM: DIGITAL DIAGNOSTIC UNILATERAL LEFT MAMMOGRAM WITH TOMOSYNTHESIS TECHNIQUE: Left digital diagnostic mammography and breast tomosynthesis was performed. COMPARISON:  Previous exam(s). ACR Breast Density Category b: There are scattered areas of fibroglandular density. FINDINGS: Additional spot compression tomographic images of the left breast demonstrate a 1 cm well-defined oil cyst just lateral to the biopsy site accounting for patient's screening mammographic finding. This is likely related to patient's recent MRI guided biopsy. IMPRESSION: 1 cm oil cyst over the slightly inner lower left breast. RECOMMENDATION: Recommend continued annual screening mammographic follow-up of the left breast. I have discussed the findings and recommendations with the patient. If applicable, a reminder letter will be sent to the patient regarding the next appointment. BI-RADS CATEGORY  2: Benign. Electronically Signed   By: Elberta Fortis M.D.   On: 10/15/2022 08:41  MR BRAIN WO CONTRAST  Result Date: 10/03/2022 CLINICAL DATA:  Provided history: Limb tremor. Additional history provided by the scanning technologist: the patient reports arm and leg tremors. EXAM: MRI HEAD WITHOUT CONTRAST TECHNIQUE: Multiplanar, multiecho pulse sequences of the brain and surrounding structures were obtained without intravenous contrast. COMPARISON:  No pertinent prior exams available  for comparison. FINDINGS: Brain: No age advanced or lobar predominant  parenchymal atrophy. Multifocal T2 FLAIR hyperintense signal abnormality within the cerebral white matter, nonspecific but compatible with mild chronic small vessel ischemic disease. There is no acute infarct. No evidence of an intracranial mass. No chronic intracranial blood products. No extra-axial fluid collection. No midline shift. Vascular: Maintained flow voids within the proximal large arterial vessels. Skull and upper cervical spine: No focal suspicious marrow lesion. Incompletely assessed cervical spondylosis. Sinuses/Orbits: No mass or acute finding within the imaged orbits. No significant paranasal sinus disease. IMPRESSION: 1. No evidence of an acute intracranial abnormality. 2. Mild chronic small vessel ischemic changes within the cerebral white matter. Electronically Signed   By: Jackey Loge D.O.   On: 10/03/2022 07:41     WRAP UP:  All questions were answered. The patient knows to call the clinic with any problems, questions or concerns.  Medical decision making: Moderate  Time spent on visit: I spent 20 minutes counseling the patient face to face. The total time spent in the appointment was 30 minutes and more than 50% was on counseling.  Carnella Guadalajara, PA-C  10/31/22 5:55 PM

## 2022-11-03 ENCOUNTER — Inpatient Hospital Stay: Payer: PPO | Attending: Hematology | Admitting: Physician Assistant

## 2022-11-03 VITALS — BP 135/81 | HR 70 | Temp 98.2°F | Resp 16 | Wt 145.9 lb

## 2022-11-03 DIAGNOSIS — Z8041 Family history of malignant neoplasm of ovary: Secondary | ICD-10-CM | POA: Diagnosis not present

## 2022-11-03 DIAGNOSIS — R634 Abnormal weight loss: Secondary | ICD-10-CM | POA: Diagnosis not present

## 2022-11-03 DIAGNOSIS — F32A Depression, unspecified: Secondary | ICD-10-CM | POA: Diagnosis not present

## 2022-11-03 DIAGNOSIS — D500 Iron deficiency anemia secondary to blood loss (chronic): Secondary | ICD-10-CM | POA: Diagnosis not present

## 2022-11-03 DIAGNOSIS — Z803 Family history of malignant neoplasm of breast: Secondary | ICD-10-CM | POA: Diagnosis not present

## 2022-11-03 DIAGNOSIS — Z801 Family history of malignant neoplasm of trachea, bronchus and lung: Secondary | ICD-10-CM | POA: Insufficient documentation

## 2022-11-03 DIAGNOSIS — E559 Vitamin D deficiency, unspecified: Secondary | ICD-10-CM | POA: Diagnosis not present

## 2022-11-03 DIAGNOSIS — Z79899 Other long term (current) drug therapy: Secondary | ICD-10-CM | POA: Diagnosis not present

## 2022-11-03 DIAGNOSIS — Z9011 Acquired absence of right breast and nipple: Secondary | ICD-10-CM | POA: Insufficient documentation

## 2022-11-03 DIAGNOSIS — Z86000 Personal history of in-situ neoplasm of breast: Secondary | ICD-10-CM | POA: Diagnosis not present

## 2022-11-03 DIAGNOSIS — D509 Iron deficiency anemia, unspecified: Secondary | ICD-10-CM | POA: Diagnosis not present

## 2022-11-03 DIAGNOSIS — Z807 Family history of other malignant neoplasms of lymphoid, hematopoietic and related tissues: Secondary | ICD-10-CM | POA: Diagnosis not present

## 2022-11-03 DIAGNOSIS — Z87891 Personal history of nicotine dependence: Secondary | ICD-10-CM | POA: Insufficient documentation

## 2022-11-03 DIAGNOSIS — D0511 Intraductal carcinoma in situ of right breast: Secondary | ICD-10-CM

## 2022-11-03 NOTE — Patient Instructions (Signed)
[facility] **VISIT SUMMARY & IMPORTANT INSTRUCTIONS **   You were seen today by Rojelio Brenner PA-C for your follow-up visit.    WEIGHT LOSS & LOW APPETITE Continue taking Mirtazepine as an appetite stimulant. Please drink 2-3 Ensure each day.  DEPRESSION Please talk to your primary care doctor about whether or not you should be started back on your antidepressant medication. I also recommend that you continue to speak with a counselor, whether this is your pastors wife or another licensed therapist.  Your primary care doctor can help you find an appropriate therapist.  FOLLOW-UP APPOINTMENT: Office visit in 3 months to address your breast cancer and iron deficiency anemia  ** Thank you for trusting me with your healthcare!  I strive to provide all of my patients with quality care at each visit.  If you receive a survey for this visit, I would be so grateful to you for taking the time to provide feedback.  Thank you in advance!  ~ Teona Vargus                   Dr. Doreatha Massed   &   Rojelio Brenner, PA-C   - - - - - - - - - - - - - - - - - -    Thank you for choosing [facility] to provide your oncology and hematology care.  To afford each patient quality time with our provider, please arrive at least 15 minutes before your scheduled appointment time.   If you have a lab appointment with the Cancer Center please come in thru the Main Entrance and check in at the main information desk.  You need to re-schedule your appointment should you arrive 10 or more minutes late.  We strive to give you quality time with our providers, and arriving late affects you and other patients whose appointments are after yours.  Also, if you no show three or more times for appointments you may be dismissed from the clinic at the providers discretion.     Again, thank you for choosing [facility].  Our hope is that  these requests will decrease the amount of time that you wait before being seen by our physicians.       _____________________________________________________________  Should you have questions after your visit to [facility], please contact our office at [phone] and follow the prompts.  Our office hours are 8:00 a.m. and 4:30 p.m. Monday - Friday.  Please note that voicemails left after 4:00 p.m. may not be returned until the following business day.  We are closed weekends and major holidays.  You do have access to a nurse 24-7, just call the main number to the clinic [phone] and do not press any options, hold on the line and a nurse will answer the phone.    For prescription refill requests, have your pharmacy contact our office and allow 72 hours.

## 2022-11-11 DIAGNOSIS — H04123 Dry eye syndrome of bilateral lacrimal glands: Secondary | ICD-10-CM | POA: Diagnosis not present

## 2022-11-11 DIAGNOSIS — H2513 Age-related nuclear cataract, bilateral: Secondary | ICD-10-CM | POA: Diagnosis not present

## 2022-11-11 DIAGNOSIS — H401131 Primary open-angle glaucoma, bilateral, mild stage: Secondary | ICD-10-CM | POA: Diagnosis not present

## 2022-11-11 DIAGNOSIS — H47233 Glaucomatous optic atrophy, bilateral: Secondary | ICD-10-CM | POA: Diagnosis not present

## 2022-11-27 DIAGNOSIS — H1045 Other chronic allergic conjunctivitis: Secondary | ICD-10-CM | POA: Diagnosis not present

## 2022-12-01 DIAGNOSIS — R252 Cramp and spasm: Secondary | ICD-10-CM | POA: Diagnosis not present

## 2022-12-01 DIAGNOSIS — R251 Tremor, unspecified: Secondary | ICD-10-CM | POA: Diagnosis not present

## 2022-12-01 DIAGNOSIS — F338 Other recurrent depressive disorders: Secondary | ICD-10-CM | POA: Diagnosis not present

## 2022-12-01 DIAGNOSIS — E559 Vitamin D deficiency, unspecified: Secondary | ICD-10-CM | POA: Diagnosis not present

## 2022-12-01 DIAGNOSIS — I10 Essential (primary) hypertension: Secondary | ICD-10-CM | POA: Diagnosis not present

## 2022-12-01 DIAGNOSIS — E1169 Type 2 diabetes mellitus with other specified complication: Secondary | ICD-10-CM | POA: Diagnosis not present

## 2022-12-01 DIAGNOSIS — Z6827 Body mass index (BMI) 27.0-27.9, adult: Secondary | ICD-10-CM | POA: Diagnosis not present

## 2022-12-01 DIAGNOSIS — I119 Hypertensive heart disease without heart failure: Secondary | ICD-10-CM | POA: Diagnosis not present

## 2022-12-01 DIAGNOSIS — Z0001 Encounter for general adult medical examination with abnormal findings: Secondary | ICD-10-CM | POA: Diagnosis not present

## 2022-12-22 ENCOUNTER — Ambulatory Visit: Payer: PPO | Admitting: Internal Medicine

## 2023-01-05 DIAGNOSIS — M79674 Pain in right toe(s): Secondary | ICD-10-CM | POA: Diagnosis not present

## 2023-01-05 DIAGNOSIS — M79675 Pain in left toe(s): Secondary | ICD-10-CM | POA: Diagnosis not present

## 2023-01-05 DIAGNOSIS — E1142 Type 2 diabetes mellitus with diabetic polyneuropathy: Secondary | ICD-10-CM | POA: Diagnosis not present

## 2023-01-05 DIAGNOSIS — M2011 Hallux valgus (acquired), right foot: Secondary | ICD-10-CM | POA: Diagnosis not present

## 2023-01-05 DIAGNOSIS — M2012 Hallux valgus (acquired), left foot: Secondary | ICD-10-CM | POA: Diagnosis not present

## 2023-01-29 DIAGNOSIS — Z6827 Body mass index (BMI) 27.0-27.9, adult: Secondary | ICD-10-CM | POA: Diagnosis not present

## 2023-01-29 DIAGNOSIS — I10 Essential (primary) hypertension: Secondary | ICD-10-CM | POA: Diagnosis not present

## 2023-01-29 DIAGNOSIS — E1169 Type 2 diabetes mellitus with other specified complication: Secondary | ICD-10-CM | POA: Diagnosis not present

## 2023-01-29 DIAGNOSIS — E785 Hyperlipidemia, unspecified: Secondary | ICD-10-CM | POA: Diagnosis not present

## 2023-01-29 DIAGNOSIS — F064 Anxiety disorder due to known physiological condition: Secondary | ICD-10-CM | POA: Diagnosis not present

## 2023-01-29 DIAGNOSIS — F0631 Mood disorder due to known physiological condition with depressive features: Secondary | ICD-10-CM | POA: Diagnosis not present

## 2023-01-29 DIAGNOSIS — M5432 Sciatica, left side: Secondary | ICD-10-CM | POA: Diagnosis not present

## 2023-01-29 DIAGNOSIS — R251 Tremor, unspecified: Secondary | ICD-10-CM | POA: Diagnosis not present

## 2023-02-08 NOTE — Progress Notes (Unsigned)
Nancy Liu, female    DOB: 06-Jun-1940    MRN: [id number]   Brief patient profile:  31 yobf quit smoking 1989   self referred to pulmonary clinic in [place]  07/03/2022  for refractory cough flared summer 2023 but onset per record was really 2008   Has seen Dr Craige Cotta with working dx of bronchiectasis by HRCT  10/20/19    History of Present Illness  07/03/2022  Pulmonary/ 1st office eval/ Lehua Flores / [place] [facility]  Chief Complaint  Patient presents with   Acute Visit    Ongoing "bad cough"  Patient states no improvement and lately the cough causes her nose to run. Neg covid test   Dyspnea:  also onset with cough/ esp steps now much more difficult  Cough: urge to clear throat during the day / worse immediately at hs/ assoc with watery pnds no better on H1/ assoc hoarseness and throat discomfort  Sleep: nightly  SABA use: don't help 02: none  Rec Continue prevacid 30 mg Take 30- 60 min before your first and last meals of the day  GERD diet reviewed, bed blocks rec  For drainage / throat tickle try take CHLORPHENIRAMINE  4 mg   Depomerdrol 120 mg IM today  For cough > mucinex dm 1200 mg every 12 hours as needed  Return to this clinic if not better with all medications with you in 2 weeks    07/17/2022  f/u ov/[place] office/Endia Moncur re: chronic cough  maint on gerd rx/ h1 / tessalon/ depomedrol 120 >>> no better   Chief Complaint  Patient presents with   Follow-up    Coughing a lot. Daughter is requesting that patient has an X Ray done on throat and chest area and states her cough is not getting any better at all   Dyspnea:  grocery  store walking makes her sob x sev years  Cough: wakes up due to 5 am  cough to point of gag but nothing usually speck or two of clear mucs produced  Sleeping: cough at hs but resolves  during the night SABA use: none  02: none  Rec GERD diet reviewed, bed blocks rec  Take delsym two tsp every 12 hours and supplement if needed with  tramadol 50 mg  up to 1-2 every 4 hours Prednisone 10 mg take  4 each am x 2 days,   2 each am x 2 days,  1 each am x 2 days and stop Cxr ok    08/25/2022  f/u ov/[place] office/Sarya Linenberger re: cough x 2008/ bronchiectasis on HRCT  maint on gerd rx  did bring meds  Chief Complaint  Patient presents with   Follow-up    Pt f/u for chronic cough, states that she is doing well  Dyspnea:  can shop for an hour, slow pace sometime with midline cp x 2 min / already under care of cards  Cough: better since last ov - no longer waking up coughing  Sleeping: no noct  cough / using planning  SABA use: none  02: none  Rec Discuss the chest pain that comes on with exertion with your heart doctor - call immediately if problem worsens  No change in medications needed     02/09/2023  28m  f/u ov/[place] office/Waylen Depaolo re: cough x 2008/ bronchiectasis on HRCT  maint on gerd rx only > resolved  Chief Complaint  Patient presents with   Follow-up    Follow up coughing is better  Dyspnea:  walking x 10 min flat outdoors no cp/ sob  Cough: none at present  Sleeping: bed is flat/ 3 pillows  s  resp cc  SABA use: none  02: none     No obvious day to day or daytime variability or assoc excess/ purulent sputum or mucus plugs or hemoptysis or cp or chest tightness, subjective wheeze or overt sinus or hb symptoms.    Also denies any obvious fluctuation of symptoms with weather or environmental changes or other aggravating or alleviating factors except as outlined above   No unusual exposure hx or h/o childhood pna/ asthma or knowledge of premature birth.  Current Allergies, Complete Past Medical History, Past Surgical History, Family History, and Social History were reviewed in [employer] record.  ROS  The following are not active complaints unless bolded Hoarseness, sore throat, dysphagia, dental problems, itching, sneezing,  nasal congestion or discharge of excess mucus or purulent secretions, ear  ache,   fever, chills, sweats, unintended wt loss or wt gain, classically pleuritic or exertional cp,  orthopnea pnd or arm/hand swelling  or leg swelling, presyncope, palpitations, abdominal pain, anorexia, nausea, vomiting, diarrhea  or change in bowel habits or change in bladder habits, change in stools or change in urine, dysuria, hematuria,  rash, arthralgias, visual complaints, headache, numbness, weakness or ataxia or problems with walking or coordination,  change in mood or  memory.        Current Meds  Medication Sig   acetaminophen (TYLENOL) 500 MG tablet Take 1,000 mg by mouth every 6 (six) hours as needed for moderate pain or headache.   amLODipine (NORVASC) 10 MG tablet TAKE ONE TABLET BY MOUTH EVERYDAY AT BEDTIME   aspirin EC 81 MG tablet Take 81 mg by mouth daily. Swallow whole.   carvedilol (COREG) 6.25 MG tablet TAKE ONE TABLET BY MOUTH BEFORE BREAKFAST and TAKE ONE TABLET BY MOUTH EVERYDAY AT BEDTIME   Cholecalciferol (VITAMIN D3) 50 MCG (2000 UT) TABS TAKE ONE TABLET BY MOUTH daily in addition TO THE 1,000unit FOR total of 3,000units daily   Cholecalciferol 25 MCG (1000 UT) tablet TAKE ONE TABLET BY MOUTH daily along with THE 2,000unit FOR A total of 3,000units daily   citalopram (CELEXA) 20 MG tablet Take 20 mg by mouth daily.   irbesartan-hydrochlorothiazide (AVALIDE) 300-12.5 MG tablet Take 1 tablet by mouth daily before breakfast.   lansoprazole (PREVACID) 30 MG capsule TAKE ONE CAPSULE BY MOUTH EVERY MORNING   metFORMIN (GLUCOPHAGE) 500 MG tablet Take 500 mg by mouth at bedtime.   mirtazapine (REMERON) 7.5 MG tablet Take 1 tablet (7.5 mg total) by mouth at bedtime. For depression and weight loss.   rosuvastatin (CRESTOR) 20 MG tablet TAKE ONE TABLET BY MOUTH EVERYDAY AT BEDTIME   triamcinolone ointment (KENALOG) 0.1 % Apply topically 2 (two) times daily as needed.   triamcinolone ointment (KENALOG) 0.5 % Apply 1 application  topically 2 (two) times daily as needed (rash).    Turmeric 500 MG CAPS Take 500 mg by mouth daily.              Past Medical History:  Diagnosis Date   Arteriosclerotic cardiovascular disease (ASCVD) 2001   Bare-metal stent placed in the right coronary artery in 12/01; residual 50% lesion of the first diagonal and mid LAD   Breast cancer (HCC)    Cerebrovascular disease    COPD (chronic obstructive pulmonary disease) (HCC)    Cyst, dermoid, arm, left 03/26/2018   Diabetes  mellitus    excellent control with a low-dose of a single oral agent   Family history of breast cancer 01/23/2021   Family history of ovarian cancer 01/23/2021   GERD (gastroesophageal reflux disease)    with ulcers   History of right coronary artery stent placement 2000   Hyperlipidemia    Hypertension    Sleep apnea    Thalassemia minor    Tobacco abuse, in remission    35 pack years; Quit in 1980       Objective:    Wts  02/09/2023         151  08/25/2022       144   07/17/22 142 lb 6.4 oz (64.6 kg)  07/16/22 144 lb (65.3 kg)  07/03/22 146 lb 6.4 oz (66.4 kg)    Vital signs reviewed  02/09/2023  - Note at rest 02 sats  98% on RA   General appearance:    amb wf nad   HEENT : Oropharynx  wearing mask    NECK :  without  apparent JVD/ palpable Nodes/TM    LUNGS: no acc muscle use,  Nl contour chest which is clear to A and P bilaterally without cough on insp or exp maneuvers   CV:  RRR  no s3 or murmur or increase in P2, and no edema   ABD:  soft and nontender    MS:  Nl gait/ ext warm without deformities Or obvious joint restrictions  calf tenderness, cyanosis or clubbing    SKIN: warm and dry without lesions    NEURO:  alert, approp, nl sensorium with  no motor or cerebellar deficits apparent.      Assessment

## 2023-02-09 ENCOUNTER — Inpatient Hospital Stay: Payer: PPO | Attending: Hematology

## 2023-02-09 ENCOUNTER — Encounter: Payer: Self-pay | Admitting: Internal Medicine

## 2023-02-09 ENCOUNTER — Ambulatory Visit: Payer: PPO | Admitting: Internal Medicine

## 2023-02-09 VITALS — BP 145/80 | HR 81 | Ht 62.5 in | Wt 151.0 lb

## 2023-02-09 DIAGNOSIS — Z86000 Personal history of in-situ neoplasm of breast: Secondary | ICD-10-CM | POA: Insufficient documentation

## 2023-02-09 DIAGNOSIS — D0511 Intraductal carcinoma in situ of right breast: Secondary | ICD-10-CM

## 2023-02-09 DIAGNOSIS — E559 Vitamin D deficiency, unspecified: Secondary | ICD-10-CM | POA: Diagnosis not present

## 2023-02-09 DIAGNOSIS — R053 Chronic cough: Secondary | ICD-10-CM

## 2023-02-09 DIAGNOSIS — D500 Iron deficiency anemia secondary to blood loss (chronic): Secondary | ICD-10-CM

## 2023-02-09 DIAGNOSIS — D509 Iron deficiency anemia, unspecified: Secondary | ICD-10-CM | POA: Insufficient documentation

## 2023-02-09 LAB — COMPREHENSIVE METABOLIC PANEL
ALT: 13 U/L (ref 0–44)
AST: 21 U/L (ref 15–41)
Albumin: 4.2 g/dL (ref 3.5–5.0)
Alkaline Phosphatase: 61 U/L (ref 38–126)
Anion gap: 10 (ref 5–15)
BUN: 21 mg/dL (ref 8–23)
CO2: 27 mmol/L (ref 22–32)
Calcium: 9.1 mg/dL (ref 8.9–10.3)
Chloride: 99 mmol/L (ref 98–111)
Creatinine, Ser: 1 mg/dL (ref 0.44–1.00)
GFR, Estimated: 57 mL/min — ABNORMAL LOW (ref >60)
Glucose, Bld: 108 mg/dL — ABNORMAL HIGH (ref 70–99)
Potassium: 3.9 mmol/L (ref 3.5–5.1)
Sodium: 136 mmol/L (ref 135–145)
Total Bilirubin: 0.7 mg/dL (ref 0.3–1.2)
Total Protein: 7.1 g/dL (ref 6.5–8.1)

## 2023-02-09 LAB — CBC WITH DIFFERENTIAL/PLATELET
Abs Immature Granulocytes: 0.04 10*3/uL (ref 0.00–0.07)
Basophils Absolute: 0 10*3/uL (ref 0.0–0.1)
Basophils Relative: 0 %
Eosinophils Absolute: 0.2 10*3/uL (ref 0.0–0.5)
Eosinophils Relative: 3 %
HCT: 40.1 % (ref 36.0–46.0)
Hemoglobin: 12.5 g/dL (ref 12.0–15.0)
Immature Granulocytes: 1 %
Lymphocytes Relative: 12 %
Lymphs Abs: 0.9 10*3/uL (ref 0.7–4.0)
MCH: 24.7 pg — ABNORMAL LOW (ref 26.0–34.0)
MCHC: 31.2 g/dL (ref 30.0–36.0)
MCV: 79.2 fL — ABNORMAL LOW (ref 80.0–100.0)
Monocytes Absolute: 0.4 10*3/uL (ref 0.1–1.0)
Monocytes Relative: 5 %
Neutro Abs: 6 10*3/uL (ref 1.7–7.7)
Neutrophils Relative %: 79 %
Platelets: 226 10*3/uL (ref 150–400)
RBC: 5.06 MIL/uL (ref 3.87–5.11)
RDW: 15.9 % — ABNORMAL HIGH (ref 11.5–15.5)
WBC: 7.6 10*3/uL (ref 4.0–10.5)
nRBC: 0 % (ref 0.0–0.2)

## 2023-02-09 LAB — VITAMIN D 25 HYDROXY (VIT D DEFICIENCY, FRACTURES): Vit D, 25-Hydroxy: 44.62 ng/mL (ref 30–100)

## 2023-02-09 LAB — IRON AND TIBC
Iron: 53 ug/dL (ref 28–170)
Saturation Ratios: 16 % (ref 10.4–31.8)
TIBC: 329 ug/dL (ref 250–450)
UIBC: 276 ug/dL

## 2023-02-09 LAB — FERRITIN: Ferritin: 82 ng/mL (ref 11–307)

## 2023-02-09 NOTE — Patient Instructions (Signed)
For cough > mucinex dm 1200 mg every 12 hours as needed    If you are satisfied with your treatment plan,  let your doctor know and he/she can either refill your medications or you can return here when your prescription runs out.     If in any way you are not 100% satisfied,  please tell us.  If 100% better, tell your friends!  Pulmonary follow up is as needed

## 2023-02-09 NOTE — Assessment & Plan Note (Addendum)
Onset 2008/worse since summer 2023 with bronchiectasis by   Chest  HRCT  10/20/19  - 07/03/2022 try max rx for gerd and pnds with 1st gen H1 blockers per guidelines   - Allergy screen 07/03/2022 >  Eos 0.3 /  IgE  29 - cycilcal cough regimen 07/17/2022 >>> resolved as of 08/25/2022 > continue gerd rx    Complete resolution of cough despite dx of bronchiectasis while on gerd rx so no need for further pulmonary f/u   For cough prn can use mucinex dm as per AVS and f/u here can be prn as well           Each maintenance medication was reviewed in detail including emphasizing most importantly the difference between maintenance and prns and under what circumstances the prns are to be triggered using an action plan format where appropriate.  Total time for H and P, chart review, counseling, and generating customized AVS unique to this office visit / same day charting = 20 min final summary f/u ov

## 2023-02-15 NOTE — Progress Notes (Deleted)
[facility] [address], [place] [address]   CLINIC:  Medical Oncology/Hematology  PCP:  Renaye Rakers, MD [address] / [place] [address] [phone]   REASON FOR VISIT:  Follow-up for history of multifocal right breast DCIS + iron deficiency anemia  PRIOR THERAPY: S/p right-sided mastectomy (02/20/2021)  CURRENT THERAPY: Surveillance of breast cancer with intermittent IV iron  BRIEF ONCOLOGIC HISTORY:   Oncology History  Ductal carcinoma in situ (DCIS) of right breast  01/01/2021 Mammogram   FINDINGS: Multiple circumscribed oval masses are noted in the MEDIAL portion of the RIGHT breast. Masses vary from 1-4 millimeters in diameter and appear contiguous, spanning 8.9 x 3.6 x 5.1 centimeters. There are faint calcifications spanning 4.2 x 1.9 x 3.5 centimeters in this same, segmental distribution.   01/14/2021 Cancer Staging   Staging form: Breast, AJCC 8th Edition - Clinical stage from 01/14/2021: Stage 0 (cTis (DCIS), cN0, cM0, G2, ER+, PR+, HER2: Not Assessed) - Signed by Malachy Mood, MD on 01/21/2021 Stage prefix: Initial diagnosis Histologic grading system: 3 grade system   01/14/2021 Pathology Results   Diagnosis 1. Breast, right, needle core biopsy, anterior extent - DUCTAL CARCINOMA IN SITU, INTERMEDIATE GRADE WITH CALCIFICATIONS. SEE NOTE 2. Breast, right, needle core biopsy, posterior extent - DUCTAL CARCINOMA IN SITU, INTERMEDIATE GRADE WITH CALCIFICATIONS. SEE NOTE Diagnosis Note 1. and 2. DCIS measures 0.6 cm in part 1 and 0.4 cm in part 2.  1. PROGNOSTIC INDICATORS Results: IMMUNOHISTOCHEMICAL AND MORPHOMETRIC ANALYSIS PERFORMED MANUALLY Estrogen Receptor: >95%, POSITIVE, STRONG STAINING INTENSITY Progesterone Receptor: >95%, POSITIVE, STRONG STAINING INTENSITY     01/17/2021 Initial Diagnosis   Ductal carcinoma in situ (DCIS) of right breast   02/05/2021 Genetic Testing   Negative hereditary cancer genetic testing: no  pathogenic variants detected in [facility] CustomNext-Cancer +RNAinsight Panel.  The report date is February 05, 2021.   The CustomNext-Cancer+RNAinsight panel offered by [facility] includes sequencing and rearrangement analysis for the following 47 genes:  APC, ATM, AXIN2, BARD1, BMPR1A, BRCA1, BRCA2, BRIP1, CDH1, CDK4, CDKN2A, CHEK2, DICER1, EPCAM, GREM1, HOXB13, MEN1, MLH1, MSH2, MSH3, MSH6, MUTYH, NBN, NF1, NF2, NTHL1, PALB2, PMS2, POLD1, POLE, PTEN, RAD51C, RAD51D, RECQL, RET, SDHA, SDHAF2, SDHB, SDHC, SDHD, SMAD4, SMARCA4, STK11, TP53, TSC1, TSC2, and VHL.  RNA data is routinely analyzed for use in variant interpretation for all genes.   02/20/2021 Cancer Staging   Staging form: Breast, AJCC 8th Edition - Pathologic stage from 02/20/2021: No Stage Recommended (pTis (DCIS), pNX, cM1, G2, ER+, PR+, HER2: Not Assessed) - Signed by Malachy Mood, MD on 03/12/2021 Stage prefix: Initial diagnosis Histologic grading system: 3 grade system Residual tumor (R): R0 - None   02/20/2021 Definitive Surgery   FINAL MICROSCOPIC DIAGNOSIS:   A. BREAST, RIGHT, MASTECTOMY:  - Multifocal intermediate grade ductal carcinoma in situ with calcifications.  - Atypical lobular hyperplasia.  - Margins are negative for carcinoma.  - Biopsy site (x2).  - See oncology table.      CANCER STAGING:  Cancer Staging  Ductal carcinoma in situ (DCIS) of right breast Staging form: Breast, AJCC 8th Edition - Clinical stage from 01/14/2021: Stage 0 (cTis (DCIS), cN0, cM0, G2, ER+, PR+, HER2: Not Assessed) - Signed by Malachy Mood, MD on 01/21/2021 - Pathologic stage from 02/20/2021: No Stage Recommended (pTis (DCIS), pNX, cM1, G2, ER+, PR+, HER2: Not Assessed) - Signed by Malachy Mood, MD on 03/12/2021   INTERVAL HISTORY:   Nancy Liu, a 82 y.o.  female, follows at our clinic for her iron deficiency anemia and breast cancer.  She was last seen by Rojelio Brenner PA-C on 11/03/2022.    BREAST CANCER HISTORY:  *** She  denies any symptoms of recurrence such as new lumps, bone pain,or abdominal pain. *** She has no new headaches, seizures, or focal neurologic deficits. *** No B symptoms.  IRON DEFICIENCY ANEMIA:  She is taking iron tablet once daily.*** ***  She continues to have chronically dark bowel movements after starting iron pills, but denies any gross melena or hematochezia. *** She reports significant fatigue. She has some chronic dyspnea on exertion.  *** She reports occasional chest pain, but denies any chest pain today; she follows with cardiology annually and has been advised to speak to them about her dyspnea and chest pain.   ***She denies any pica, restless legs, headache, lightheadedness, or syncope.   ***She frequently "feels cold."   VITAMIN D DEFICIENCY: She is taking vitamin D 3000 units daily.***   DEPRESSION: She reports that her depression is also somewhat improved, and she is taking citalopram 20 mg daily as prescribed per her PCP.  *** ***She does continue to have some underlying stressors such as ***. *** She continues to remain somewhat isolated ever since COVID-19 pandemic. *** She has not yet been able to find a therapist, but reports that she is working on this with her PCP. ***  She denies any thoughts of self-harm or suicidal ideation.   WEIGHT LOSS: She continues to take mirtazapine 7.5 mg QHS as of 08/04/2022. *** She reports that her appetite has significantly improved, and she has regained 6*** pounds in the past 3 months (weight today *** pounds).  She continues to drink 2-3 Ensure daily.***  She reports ***% energy and ***% appetite.  She is maintaining stable weight at this time.  ASSESSMENT & PLAN:  1.  Multifocal right breast DCIS, grade 2, ER/PR+, and ALH  - She was initially managed by Dr. Mosetta Putt at [facility].  Transferred care to [facility] due to closer proximity to home.   - S/p right mastectomy 02/20/21 by Dr. Dwain Sarna. Path showed  multifocal intermediate grade DCIS, surgical margins were negative.  It also showed atypical lobular hyperplasia [facility]), which is a high risk for future breast cancer. - Due to her advanced age and potential side effects, she did not proceed with anti-estrogen therapy - MRI breast (05/05/2022): Two  5-6 mm enhancing foci in the left breast, BI-RADS Category 4, suspicious - MRI guided left breast biopsy (05/12/2022): Pathology revealed benign breast parenchyma with sclerosing adenosis, fibrocystic change, apocrine metaplasia and microcalcification - Screening mammogram (09/29/2022) showed possible asymmetry in left breast.  Diagnostic mammogram (10/15/2022) revealed 1 cm oil cyst over slightly inner lower left breast.  No evidence concerning for malignancy. - Physical exam showed right mastectomy site within normal limits.  ***Left nipple retraction is chronic (see note by NP Santiago Glad from 01/15/2022).  No discrete nodules, masses, or lymphadenopathy palpated. - Labs (02/09/2023) nonconcerning for recurrent breast cancer - History/ROS negative for any "red flag" symptoms of recurrence***  - No clinical concern for new or recurrent breast cancer at this time.***  - PLAN: Continue surveillance with high risk screening protocol to include annual left mammogram and annual breast MRI, staggered every 6 months for the next 5 years. Breast MRI due December 2024 Left mammogram due May 2025 - Office visit and physical exam every 6 months (next due March 2025)  2.  Microcytic anemia from iron deficiency - Secondary to iron deficiency, which is most likely due to small bowel AVMs.  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 - Patient has no M spike on SPEP. - She was Hemoccult stool negative in March 2022. - GI capsule study in May 2022 with few gastric and small bowel erosions and possible small bowel AVM without any active bleeding. - EGD (02/28/2020): Gastritis,  otherwise normal - Colonoscopy (03/03/2016): Diverticulosis, nonbleeding internal hemorrhoids - She last received Feraheme on 01/30/2022. - She is taking iron tablet once daily*** - She continues to have chronic fatigue.***     - Most recent labs (02/09/2023): Normal Hgb 12.5/MCV 79.2.  Ferritin 82, iron saturation 16%.  - PLAN: No indication for IV iron at this time.  *** IV iron if symptomatic *** continue daily iron supplementation. - Repeat labs and RTC in 6 months    3.  Vitamin D deficiency - Patient was previously taking vitamin D 50,000 units weekly, noted to have elevated vitamin D 133.41 (05/13/2021), therefore vitamin D was held. - She is currently taking vitamin D 3000 units daily *** - Most recent vitamin D (02/09/2023): Normal at 44.62  - PLAN: Continue taking vitamin D 3000 units daily.  ***Recheck annually (next due September 2025)  4.  Weight loss & depression - Depression, poor appetite, and weight loss over the past 2 to 3 years.  - Started on mirtazapine 7.5 mg QHS as of 08/04/2022.  She reports that her appetite has significantly improved, and she has regained 6 pounds in the past 3 months (weight today 145 pounds).***  She continues to drink 2-3 Ensure daily. - Depression is also somewhat improved, and she is taking citalopram 20 mg daily as prescribed per her PCP.  ***She is still trying to find a therapist to establish care with. - MEDICATION ALERT: Patient is taking mirtazapine 7.5 mg nightly as well as citalopram 25 mg daily.  This increases the risk of serotonin syndrome and QT prolongation. Discussed with neurologist due to her worsening tremors (Dr. Everlena Cooper) - although both medications could potentially aggravate tremor, they are not suspected to be cause of tremor since the tremor predates initiation of these medications.  Recommends continuing mirtazapine since it has been helpful, but would consider starting her on primidone if tremors begin to impact her quality of  life. Message left with PCP office (Dr. Parke Simmers) to discuss monitoring of QTc and attention to possible symptoms of serotonin syndrome.  No concerns at this time from my standpoint, but would like to touch base with PCP as well.  Will await her call back. Reviewed ECG from 10/17/2022 - QT-c 457 ms (normal QT-c for females of her age is < 460 ms) No evidence of serotonin syndrome such as mental status changes, symptoms of autonomic instability, or gastrointestinal symptoms.  ***Tremors as described above.  - PLAN: Weight loss has resolved, with improvement in weight noted at today's visit.*** - Continue mirtazapine 7.5 mg QHS.*** - Advised patient to continue to seek additional help from a licensed clinical therapist. - Patient advised to seek immediate medical attention if she had any thoughts of self-harm or suicidal ideation/intent.  (Denies at today's visit) - Continue at least 2 Ensure daily.  Encourage small frequent meals throughout the day.   5.  Bone health  - DEXA scan 09/12/21 showed mild osteopenia, lowest T score -1.2 at the R femur neck - PLAN: Continue calcium, vitamin  D, and weightbearing exercise.    PLAN SUMMARY: >> *** IV iron *** >> MRI breast *** >> Labs in 6 months = CBC/D, CMP, ferritin, iron/TIBC >> OFFICE visit in 6 months (after labs)    REVIEW OF SYSTEMS: ***  Review of Systems  Constitutional:  Positive for fatigue. Negative for appetite change, chills, diaphoresis, fever and unexpected weight change.  HENT:   Negative for lump/mass and nosebleeds.   Eyes:  Negative for eye problems.  Respiratory:  Positive for cough. Negative for hemoptysis and shortness of breath.   Cardiovascular:  Negative for chest pain (Intermittently, none today), leg swelling and palpitations.  Gastrointestinal:  Negative for abdominal pain, blood in stool, constipation, diarrhea, nausea and vomiting.  Genitourinary:  Negative for hematuria.   Skin: Negative.   Neurological:  Positive  for numbness. Negative for dizziness, headaches and light-headedness.  Hematological:  Does not bruise/bleed easily.  Psychiatric/Behavioral:  Negative for depression.     PHYSICAL EXAM:   Performance status (ECOG): 1 - Symptomatic but completely ambulatory *** There were no vitals filed for this visit. Wt Readings from Last 3 Encounters:  02/09/23 151 lb (68.5 kg)  11/03/22 145 lb 15.1 oz (66.2 kg)  10/17/22 141 lb (64 kg)   Physical Exam Constitutional:      Appearance: Normal appearance. She is normal weight.  Cardiovascular:     Rate and Rhythm: Normal rate and regular rhythm.     Pulses: Normal pulses.     Heart sounds: Normal heart sounds.  Pulmonary:     Effort: Pulmonary effort is normal.     Breath sounds: Normal breath sounds.  Chest:       Comments: BREAST EXAM DEFERRED TODAY (11/03/2022). Prior exam (07/22/2022): Right mastectomy site within normal limits.  Left nipple inversion is chronic and at baseline.  No discrete nodules, masses, or lymphadenopathy palpated. Musculoskeletal:        General: No swelling.  Lymphadenopathy:     Cervical: No cervical adenopathy.     Upper Body:     Right upper body: No supraclavicular, axillary or pectoral adenopathy.     Left upper body: No supraclavicular, axillary or pectoral adenopathy.  Skin:    General: Skin is warm and dry.  Neurological:     General: No focal deficit present.     Mental Status: She is alert and oriented to person, place, and time. Mental status is at baseline.  Psychiatric:        Mood and Affect: Mood normal.        Behavior: Behavior normal. Behavior is cooperative.    PAST MEDICAL/SURGICAL HISTORY:  Past Medical History:  Diagnosis Date   Arteriosclerotic cardiovascular disease (ASCVD) 2001   Bare-metal stent placed in the right coronary artery in 12/01; residual 50% lesion of the first diagonal and mid LAD   Breast cancer (HCC)    Cerebrovascular disease    COPD (chronic obstructive pulmonary  disease) (HCC)    Cyst, dermoid, arm, left 03/26/2018   Diabetes mellitus    excellent control with a low-dose of a single oral agent   Family history of breast cancer 01/23/2021   Family history of ovarian cancer 01/23/2021   GERD (gastroesophageal reflux disease)    with ulcers   History of right coronary artery stent placement 2000   Hyperlipidemia    Hypertension    Sleep apnea    Thalassemia minor    Tobacco abuse, in remission    35 pack years; Quit in 1980  Past Surgical History:  Procedure Laterality Date   BIOPSY  02/28/2020   Procedure: BIOPSY;  Surgeon: Lanelle Bal, DO;  [facility]: [facility];  Service: Endoscopy;;   COLONOSCOPY  2006   Dr. Loreta Ave: normal   COLONOSCOPY  2000   Dr. Tad Moore: normal    COLONOSCOPY N/A 03/03/2016   Dr. Darrick Penna: diverticulosis, non-bleeding hemorrhoids, redundant left colon.   DILATION AND CURETTAGE OF UTERUS     ESOPHAGOGASTRODUODENOSCOPY  2000   Dr. Tad Moore: gastritis    ESOPHAGOGASTRODUODENOSCOPY  2006   Dr. Loreta Ave: small hiatal hernia, gastritis, negative H.pylori    ESOPHAGOGASTRODUODENOSCOPY N/A 03/03/2016   Procedure: ESOPHAGOGASTRODUODENOSCOPY (EGD);  Surgeon: West Bali, MD;  Location: [facility];  Service: Endoscopy;  Laterality: N/A;   ESOPHAGOGASTRODUODENOSCOPY N/A 06/03/2016   Dr. Darrick Penna: nonaggressive gastritis due to aspirin use. Previous ulcers had healed.   ESOPHAGOGASTRODUODENOSCOPY (EGD) WITH PROPOFOL N/A 02/28/2020   focal inflammation in the gastric antrum consistent with gastritis on biopsies. No H.pylori.    EXCISION OF KELOID Left 03/26/2018   Procedure: EXCISION OF LEFT ARM MASS;  Surgeon: Claud Kelp, MD;  Location: [facility];  Service: General;  Laterality: Left;   GIVENS CAPSULE STUDY N/A 10/10/2020   Procedure: GIVENS CAPSULE STUDY;  Surgeon: Lanelle Bal, DO;  Location: [facility];  Service: Endoscopy;  Laterality: N/A;  7:30am   INGUINAL HERNIA REPAIR  1970s   Right   TOTAL  MASTECTOMY Right 02/20/2021   Procedure: RIGHT TOTAL MASTECTOMY;  Surgeon: Emelia Loron, MD;  Location: [facility] OR;  Service: General;  Laterality: Right;   VAGINAL HYSTERECTOMY  1980   Unilateral oophorectomy    SOCIAL HISTORY:  Social History   Socioeconomic History   Marital status: Divorced    Spouse name: Not on file   Number of children: 3   Years of education: Not on file   Highest education level: Not on file  Occupational History   Occupation: Retired    Comment: Factory work  Tobacco Use   Smoking status: Former    Current packs/day: 0.00    Average packs/day: 1 pack/day for 18.0 years (18.0 ttl pk-yrs)    Types: Cigarettes    Start date: 06/02/1965    Quit date: 06/09/1980    Years since quitting: 42.7   Smokeless tobacco: Never  Vaping Use   Vaping status: Never Used  Substance and Sexual Activity   Alcohol use: No    Alcohol/week: 0.0 standard drinks of alcohol   Drug use: No   Sexual activity: Not Currently  Other Topics Concern   Not on file  Social History Narrative   Lives with daughter    Social Determinants of Health   Financial Resource Strain: Low Risk  (06/04/2021)   Overall Financial Resource Strain (CARDIA)    Difficulty of Paying Living Expenses: Not hard at all  Food Insecurity: No Food Insecurity (01/23/2021)   Hunger Vital Sign    Worried About Running Out of Food in the Last Year: Never true    Ran Out of Food in the Last Year: Never true  Transportation Needs: No Transportation Needs (01/23/2021)   PRAPARE - Administrator, Civil Service (Medical): No    Lack of Transportation (Non-Medical): No  Physical Activity: Inactive (06/04/2021)   Exercise Vital Sign    Days of Exercise per Week: 0 days    Minutes of Exercise per Session: 0 min  Stress: Stress Concern Present (06/04/2021)   [employer]  of Occupational Health - Occupational Stress Questionnaire    Feeling of Stress : To some extent  Social Connections: Moderately  Isolated (06/04/2021)   Social Connection and Isolation Panel [NHANES]    Frequency of Communication with Friends and Family: More than three times a week    Frequency of Social Gatherings with Friends and Family: More than three times a week    Attends Religious Services: More than 4 times per year    Active Member of [employer] or Organizations: No    Attends Banker Meetings: Never    Marital Status: Divorced  Catering manager Violence: Not At Risk (06/04/2021)   Humiliation, Afraid, Rape, and Kick questionnaire    Fear of Current or Ex-Partner: No    Emotionally Abused: No    Physically Abused: No    Sexually Abused: No    FAMILY HISTORY:  Family History  Problem Relation Age of Onset   Heart disease Mother        also hypertension and asthma   Lung cancer Mother        dx 39s   Ovarian cancer Mother        dx before 40   Diabetes Half-Sister    Breast cancer Half-Sister 6   Multiple myeloma Nephew 53       war-related exposures   Colon cancer Neg Hx    Colon polyps Neg Hx     CURRENT MEDICATIONS:  Current Outpatient Medications  Medication Sig Dispense Refill   acetaminophen (TYLENOL) 500 MG tablet Take 1,000 mg by mouth every 6 (six) hours as needed for moderate pain or headache.     amLODipine (NORVASC) 10 MG tablet TAKE ONE TABLET BY MOUTH EVERYDAY AT BEDTIME 90 tablet 3   aspirin EC 81 MG tablet Take 81 mg by mouth daily. Swallow whole.     carvedilol (COREG) 6.25 MG tablet TAKE ONE TABLET BY MOUTH BEFORE BREAKFAST and TAKE ONE TABLET BY MOUTH EVERYDAY AT BEDTIME 180 tablet 3   Cholecalciferol (VITAMIN D3) 50 MCG (2000 UT) TABS TAKE ONE TABLET BY MOUTH daily in addition TO THE 1,000unit FOR total of 3,000units daily 30 tablet 1   Cholecalciferol 25 MCG (1000 UT) tablet TAKE ONE TABLET BY MOUTH daily along with THE 2,000unit FOR A total of 3,000units daily 30 tablet 1   citalopram (CELEXA) 20 MG tablet Take 20 mg by mouth daily.      irbesartan-hydrochlorothiazide (AVALIDE) 300-12.5 MG tablet Take 1 tablet by mouth daily before breakfast. 90 tablet 3   lansoprazole (PREVACID) 30 MG capsule TAKE ONE CAPSULE BY MOUTH EVERY MORNING 180 capsule 3   metFORMIN (GLUCOPHAGE) 500 MG tablet Take 500 mg by mouth at bedtime.     mirtazapine (REMERON) 7.5 MG tablet Take 1 tablet (7.5 mg total) by mouth at bedtime. For depression and weight loss. 30 tablet 3   polyethylene glycol powder (GLYCOLAX/MIRALAX) 17 GM/SCOOP powder Take 1/2 capful daily as needed. Make sure to take a dose if you go more than 48 hours without a bowel movement. (Patient not taking: Reported on 02/09/2023) 255 g 3   rosuvastatin (CRESTOR) 20 MG tablet TAKE ONE TABLET BY MOUTH EVERYDAY AT BEDTIME 90 tablet 3   spironolactone (ALDACTONE) 25 MG tablet Take 25 mg by mouth every morning. (Patient not taking: Reported on 02/09/2023)     triamcinolone ointment (KENALOG) 0.1 % Apply topically 2 (two) times daily as needed.     triamcinolone ointment (KENALOG) 0.5 % Apply 1 application  topically 2 (two) times daily as needed (rash).     Turmeric 500 MG CAPS Take 500 mg by mouth daily.     No current facility-administered medications for this visit.    ALLERGIES:  Allergies  Allergen Reactions   Protonix [Pantoprazole Sodium]     DRY LIPS    LABORATORY DATA:  I have reviewed the labs as listed.     Latest Ref Rng & Units 02/09/2023    9:25 AM 07/23/2022    1:03 PM 07/03/2022    2:11 PM  CBC  WBC 4.0 - 10.5 K/uL 7.6  14.6  7.8   Hemoglobin 12.0 - 15.0 g/dL 09.6  04.5  40.9   Hematocrit 36.0 - 46.0 % 40.1  41.5  37.6   Platelets 150 - 400 K/uL 226  296  282       Latest Ref Rng & Units 02/09/2023    9:25 AM 07/23/2022    1:03 PM 07/03/2022    2:11 PM  CMP  Glucose 70 - 99 mg/dL 811  914  86   BUN 8 - 23 mg/dL 21  29  20    Creatinine 0.44 - 1.00 mg/dL 7.82  9.56  2.13   Sodium 135 - 145 mmol/L 136  131  141   Potassium 3.5 - 5.1 mmol/L 3.9  4.2  4.4   Chloride 98 -  111 mmol/L 99  94  100   CO2 22 - 32 mmol/L 27  27  26    Calcium 8.9 - 10.3 mg/dL 9.1  8.9  9.2   Total Protein 6.5 - 8.1 g/dL 7.1  6.7    Total Bilirubin 0.3 - 1.2 mg/dL 0.7  0.7    Alkaline Phos 38 - 126 U/L 61  55    AST 15 - 41 U/L 21  27    ALT 0 - 44 U/L 13  23      DIAGNOSTIC IMAGING:  I have independently reviewed the scans and discussed with the patient. No results found.   WRAP UP:  All questions were answered. The patient knows to call the clinic with any problems, questions or concerns.  Medical decision making: Moderate ***  Time spent on visit: I spent 20 minutes counseling the patient face to face. The total time spent in the appointment was 30 minutes and more than 50% was on counseling.  Carnella Guadalajara, PA-C  ***

## 2023-02-16 ENCOUNTER — Inpatient Hospital Stay: Payer: PPO | Admitting: Physician Assistant

## 2023-02-17 ENCOUNTER — Inpatient Hospital Stay: Payer: PPO | Admitting: Physician Assistant

## 2023-02-17 VITALS — BP 131/73 | HR 74 | Temp 99.1°F | Resp 17 | Ht 62.5 in | Wt 150.4 lb

## 2023-02-17 DIAGNOSIS — N6091 Unspecified benign mammary dysplasia of right breast: Secondary | ICD-10-CM | POA: Diagnosis not present

## 2023-02-17 DIAGNOSIS — D500 Iron deficiency anemia secondary to blood loss (chronic): Secondary | ICD-10-CM | POA: Diagnosis not present

## 2023-02-17 DIAGNOSIS — D0511 Intraductal carcinoma in situ of right breast: Secondary | ICD-10-CM

## 2023-02-17 DIAGNOSIS — E559 Vitamin D deficiency, unspecified: Secondary | ICD-10-CM | POA: Diagnosis not present

## 2023-02-17 DIAGNOSIS — Z86000 Personal history of in-situ neoplasm of breast: Secondary | ICD-10-CM | POA: Diagnosis not present

## 2023-02-17 MED ORDER — FERROUS SULFATE 325 (65 FE) MG PO TBEC
325.0000 mg | DELAYED_RELEASE_TABLET | Freq: Every day | ORAL | 3 refills | Status: DC
Start: 2023-02-17 — End: 2023-07-27

## 2023-02-17 NOTE — Progress Notes (Signed)
[facility] [address], [place] [address]   CLINIC:  Medical Oncology/Hematology  PCP:  Renaye Rakers, MD [address] / [place] [address] [phone]   REASON FOR VISIT:  Follow-up for history of multifocal right breast DCIS + iron deficiency anemia  PRIOR THERAPY: S/p right-sided mastectomy (02/20/2021)  CURRENT THERAPY: Surveillance of breast cancer with intermittent IV iron  BRIEF ONCOLOGIC HISTORY:   Oncology History  Ductal carcinoma in situ (DCIS) of right breast  01/01/2021 Mammogram   FINDINGS: Multiple circumscribed oval masses are noted in the MEDIAL portion of the RIGHT breast. Masses vary from 1-4 millimeters in diameter and appear contiguous, spanning 8.9 x 3.6 x 5.1 centimeters. There are faint calcifications spanning 4.2 x 1.9 x 3.5 centimeters in this same, segmental distribution.   01/14/2021 Cancer Staging   Staging form: Breast, AJCC 8th Edition - Clinical stage from 01/14/2021: Stage 0 (cTis (DCIS), cN0, cM0, G2, ER+, PR+, HER2: Not Assessed) - Signed by Malachy Mood, MD on 01/21/2021 Stage prefix: Initial diagnosis Histologic grading system: 3 grade system   01/14/2021 Pathology Results   Diagnosis 1. Breast, right, needle core biopsy, anterior extent - DUCTAL CARCINOMA IN SITU, INTERMEDIATE GRADE WITH CALCIFICATIONS. SEE NOTE 2. Breast, right, needle core biopsy, posterior extent - DUCTAL CARCINOMA IN SITU, INTERMEDIATE GRADE WITH CALCIFICATIONS. SEE NOTE Diagnosis Note 1. and 2. DCIS measures 0.6 cm in part 1 and 0.4 cm in part 2.  1. PROGNOSTIC INDICATORS Results: IMMUNOHISTOCHEMICAL AND MORPHOMETRIC ANALYSIS PERFORMED MANUALLY Estrogen Receptor: >95%, POSITIVE, STRONG STAINING INTENSITY Progesterone Receptor: >95%, POSITIVE, STRONG STAINING INTENSITY     01/17/2021 Initial Diagnosis   Ductal carcinoma in situ (DCIS) of right breast   02/05/2021 Genetic Testing   Negative hereditary cancer genetic testing: no  pathogenic variants detected in [facility] CustomNext-Cancer +RNAinsight Panel.  The report date is February 05, 2021.   The CustomNext-Cancer+RNAinsight panel offered by [facility] includes sequencing and rearrangement analysis for the following 47 genes:  APC, ATM, AXIN2, BARD1, BMPR1A, BRCA1, BRCA2, BRIP1, CDH1, CDK4, CDKN2A, CHEK2, DICER1, EPCAM, GREM1, HOXB13, MEN1, MLH1, MSH2, MSH3, MSH6, MUTYH, NBN, NF1, NF2, NTHL1, PALB2, PMS2, POLD1, POLE, PTEN, RAD51C, RAD51D, RECQL, RET, SDHA, SDHAF2, SDHB, SDHC, SDHD, SMAD4, SMARCA4, STK11, TP53, TSC1, TSC2, and VHL.  RNA data is routinely analyzed for use in variant interpretation for all genes.   02/20/2021 Cancer Staging   Staging form: Breast, AJCC 8th Edition - Pathologic stage from 02/20/2021: No Stage Recommended (pTis (DCIS), pNX, cM1, G2, ER+, PR+, HER2: Not Assessed) - Signed by Malachy Mood, MD on 03/12/2021 Stage prefix: Initial diagnosis Histologic grading system: 3 grade system Residual tumor (R): R0 - None   02/20/2021 Definitive Surgery   FINAL MICROSCOPIC DIAGNOSIS:   A. BREAST, RIGHT, MASTECTOMY:  - Multifocal intermediate grade ductal carcinoma in situ with calcifications.  - Atypical lobular hyperplasia.  - Margins are negative for carcinoma.  - Biopsy site (x2).  - See oncology table.      CANCER STAGING:  Cancer Staging  Ductal carcinoma in situ (DCIS) of right breast Staging form: Breast, AJCC 8th Edition - Clinical stage from 01/14/2021: Stage 0 (cTis (DCIS), cN0, cM0, G2, ER+, PR+, HER2: Not Assessed) - Signed by Malachy Mood, MD on 01/21/2021 - Pathologic stage from 02/20/2021: No Stage Recommended (pTis (DCIS), pNX, cM1, G2, ER+, PR+, HER2: Not Assessed) - Signed by Malachy Mood, MD on 03/12/2021   INTERVAL HISTORY:   [name], a 82 y.o.  female, follows at our clinic for her iron deficiency anemia and breast cancer.  She was last seen by Rojelio Brenner PA-C on 11/03/2022.    BREAST CANCER HISTORY: She has some  occasional swelling in her right axilla, s/p right mastectomy; no lymphedema in right arm.  She denies any symptoms of recurrence such as new lumps, bone pain,or abdominal pain.  She has no new headaches, seizures, or focal neurologic deficits.  No B symptoms.  She complains of significant right leg sciatic pain.  IRON DEFICIENCY ANEMIA:  She is uncertain as to whether or not she is taking her iron tablet.  She continues to have chronically dark bowel movements after starting iron pills, but denies any gross melena or hematochezia.  She reports ongoing fatigue. She has some chronic dyspnea on exertion. She denies any chest pain, pica, headache, lightheadedness, or syncope.  She frequently "feels cold."   VITAMIN D DEFICIENCY: She is taking vitamin D 3000 units daily.   DEPRESSION: She reports that her depression is also somewhat improved, and she is taking citalopram 20 mg daily as prescribed per her PCP.   She has not yet been able to find a therapist, but reports that she is working on this with her PCP.  She denies any thoughts of self-harm or suicidal ideation.   WEIGHT LOSS: She continues to take mirtazapine 7.5 mg QHS as of 08/04/2022.  She reports that her appetite has significantly improved, and she has been able to regain and maintain some of the weight that she had previously lost.  She continues to drink 2-3 Ensure daily.  She reports 50% energy and 70% appetite.  She is maintaining stable weight at this time.  ASSESSMENT & PLAN:  1.  Multifocal right breast DCIS, grade 2, ER/PR+, and ALH  - She was initially managed by Dr. Mosetta Putt at [facility].  Transferred care to [facility] due to closer proximity to home.   - S/p right mastectomy 02/20/21 by Dr. Dwain Sarna. Path showed multifocal intermediate grade DCIS, surgical margins were negative.  It also showed atypical lobular hyperplasia [facility]), which is a high risk for future breast cancer. - Due to her advanced age and  potential side effects, she did not proceed with anti-estrogen therapy - MRI breast (05/05/2022): Two 5-6 mm enhancing foci in the left breast, BI-RADS Category 4, suspicious - MRI guided left breast biopsy (05/12/2022): Pathology revealed benign breast parenchyma with sclerosing adenosis, fibrocystic change, apocrine metaplasia and microcalcification - Screening mammogram (09/29/2022) showed possible asymmetry in left breast.  Diagnostic mammogram (10/15/2022) revealed 1 cm oil cyst over slightly inner lower left breast.  No evidence concerning for malignancy. - Physical exam today (02/17/2023) showed right mastectomy site within normal limits.  Left nipple retraction is chronic (see note by NP Santiago Glad from 01/15/2022).  No discrete nodules, masses, or lymphadenopathy palpated. - Labs (02/09/2023) nonconcerning for recurrent breast cancer - History/ROS negative for any "red flag" symptoms of recurrence  - No clinical concern for new or recurrent breast cancer at this time.  - PLAN: Continue surveillance with high risk screening protocol to include annual left mammogram and annual breast MRI, staggered every 6 months for the next 5 years. Breast MRI due December 2024 Left mammogram due May 2025 - Office visit and physical exam every 6 months (next due March 2025)   2.  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 - Patient has no M spike on SPEP. - She was Hemoccult stool negative in March 2022. - GI capsule study in May 2022 with few gastric and small bowel erosions and possible small bowel AVM without any active bleeding. - EGD (02/28/2020): Gastritis, otherwise normal - Colonoscopy (03/03/2016): Diverticulosis, nonbleeding internal hemorrhoids - She last received Feraheme on 01/30/2022. - She is uncertain if she is taking  her iron tablet or not - She continues to have chronic fatigue.     - Most recent labs (02/09/2023): Normal Hgb 12.5/MCV 79.2.  Ferritin 82, iron saturation 16%.  - PLAN: No indication for IV iron at this time.  Prescription sent to pharmacy for her to restart ferrous sulfate 325 mg daily. - Repeat labs and RTC in 6 months    3.  Vitamin D deficiency - Patient was previously taking vitamin D 50,000 units weekly, noted to have elevated vitamin D 133.41 (05/13/2021), therefore vitamin D was held. - She is currently taking vitamin D 3000 units daily  - Most recent vitamin D (02/09/2023): Normal at 44.62  - PLAN: Continue taking vitamin D 3000 units daily.  Recheck annually (next due September 2025)  4.  Weight loss & depression - Depression, poor appetite, and weight loss over the past 2 to 3 years.  - Started on mirtazapine 7.5 mg QHS as of 08/04/2022.  She reports that her appetite has significantly improved, she has been able to regain and maintain some of the weight she had lost.  She continues to drink 2-3 Ensure daily. - Depression is also somewhat improved, and she is taking citalopram 20 mg daily as prescribed per her PCP.  She is still trying to find a therapist to establish care with. - MEDICATION ALERT: Patient is taking mirtazapine 7.5 mg nightly as well as citalopram 25 mg daily.  This increases the risk of serotonin syndrome and QT prolongation. Discussed with neurologist due to her worsening tremors (Dr. Everlena Cooper) - although both medications could potentially aggravate tremor, they are not suspected to be cause of tremor since the tremor predates initiation of these medications.  Recommends continuing mirtazapine since it has been helpful, but would consider starting her on primidone if tremors begin to impact her quality of life. Message left with PCP office (Dr. Parke Simmers) to discuss monitoring of QTc and attention to possible symptoms of serotonin syndrome.  No concerns at this time from my  standpoint, but would like to touch base with PCP as well.  Will await her call back. Reviewed ECG from 10/17/2022 - QT-c 457 ms (normal QT-c for females of her age is < 460 ms) No evidence of serotonin syndrome such as mental status changes, symptoms of autonomic instability, or gastrointestinal symptoms.  - PLAN: Weight loss has resolved, with improvement in weight noted at today's visit. - Continue mirtazapine 7.5 mg QHS. - Advised patient to continue to seek additional help from a licensed clinical therapist. - Patient advised to seek immediate medical attention if she had any thoughts of self-harm or suicidal ideation/intent.  (Denies at today's visit) - Continue at least 2 Ensure daily.  Encourage small frequent meals throughout the day. - Advised patient to discuss her ongoing tremors with her neurologist (Dr. Everlena Cooper)   5.  Bone health  - DEXA scan 09/12/21 showed mild osteopenia, lowest T score -1.2 at the R femur neck - PLAN: Continue calcium, vitamin D, and weightbearing exercise.  PLAN SUMMARY: >> MRI breast in December 2024 >> Labs in 6 months = CBC/D, CMP, ferritin, iron/TIBC >> OFFICE visit in 6 months (after labs)    REVIEW OF SYSTEMS:   Review of Systems  Constitutional:  Positive for fatigue. Negative for appetite change, chills, diaphoresis, fever and unexpected weight change.  HENT:   Negative for lump/mass and nosebleeds.   Eyes:  Negative for eye problems.  Respiratory:  Positive for cough and shortness of breath (with exertion). Negative for hemoptysis.   Cardiovascular:  Negative for chest pain (Intermittently, none today), leg swelling and palpitations.  Gastrointestinal:  Negative for abdominal pain, blood in stool, constipation, diarrhea, nausea and vomiting.  Genitourinary:  Negative for hematuria.   Skin: Negative.   Neurological:  Positive for numbness (right leg sciatic nerve pain). Negative for dizziness, headaches and light-headedness.  Hematological:   Does not bruise/bleed easily.  Psychiatric/Behavioral:  Negative for depression.     PHYSICAL EXAM:   Performance status (ECOG): 1 - Symptomatic but completely ambulatory  There were no vitals filed for this visit. Wt Readings from Last 3 Encounters:  02/09/23 151 lb (68.5 kg)  11/03/22 145 lb 15.1 oz (66.2 kg)  10/17/22 141 lb (64 kg)   Physical Exam Constitutional:      Appearance: Normal appearance. She is normal weight.  Cardiovascular:     Rate and Rhythm: Normal rate and regular rhythm.     Pulses: Normal pulses.     Heart sounds: Normal heart sounds.  Pulmonary:     Effort: Pulmonary effort is normal.     Breath sounds: Normal breath sounds.  Chest:       Comments: Right mastectomy site within normal limits.  Left nipple inversion is chronic and at baseline.  No discrete nodules, masses, or lymphadenopathy palpated. Musculoskeletal:        General: No swelling.  Lymphadenopathy:     Cervical: No cervical adenopathy.     Upper Body:     Right upper body: No supraclavicular, axillary or pectoral adenopathy.     Left upper body: No supraclavicular, axillary or pectoral adenopathy.  Skin:    General: Skin is warm and dry.  Neurological:     General: No focal deficit present.     Mental Status: She is alert and oriented to person, place, and time. Mental status is at baseline.  Psychiatric:        Mood and Affect: Mood normal.        Behavior: Behavior normal. Behavior is cooperative.     PAST MEDICAL/SURGICAL HISTORY:  Past Medical History:  Diagnosis Date   Arteriosclerotic cardiovascular disease (ASCVD) 2001   Bare-metal stent placed in the right coronary artery in 12/01; residual 50% lesion of the first diagonal and mid LAD   Breast cancer (HCC)    Cerebrovascular disease    COPD (chronic obstructive pulmonary disease) (HCC)    Cyst, dermoid, arm, left 03/26/2018   Diabetes mellitus    excellent control with a low-dose of a single oral agent   Family  history of breast cancer 01/23/2021   Family history of ovarian cancer 01/23/2021   GERD (gastroesophageal reflux disease)    with ulcers   History of right coronary artery stent placement 2000   Hyperlipidemia    Hypertension    Sleep apnea    Thalassemia minor    Tobacco abuse, in remission    35 pack years; Quit in 1980   Past Surgical History:  Procedure Laterality Date  BIOPSY  02/28/2020   Procedure: BIOPSY;  Surgeon: Lanelle Bal, DO;  Location: [facility];  Service: Endoscopy;;   COLONOSCOPY  2006   Dr. Loreta Ave: normal   COLONOSCOPY  2000   Dr. Tad Moore: normal    COLONOSCOPY N/A 03/03/2016   Dr. Darrick Penna: diverticulosis, non-bleeding hemorrhoids, redundant left colon.   DILATION AND CURETTAGE OF UTERUS     ESOPHAGOGASTRODUODENOSCOPY  2000   Dr. Tad Moore: gastritis    ESOPHAGOGASTRODUODENOSCOPY  2006   Dr. Loreta Ave: small hiatal hernia, gastritis, negative H.pylori    ESOPHAGOGASTRODUODENOSCOPY N/A 03/03/2016   Procedure: ESOPHAGOGASTRODUODENOSCOPY (EGD);  Surgeon: West Bali, MD;  Location: [facility];  Service: Endoscopy;  Laterality: N/A;   ESOPHAGOGASTRODUODENOSCOPY N/A 06/03/2016   Dr. Darrick Penna: nonaggressive gastritis due to aspirin use. Previous ulcers had healed.   ESOPHAGOGASTRODUODENOSCOPY (EGD) WITH PROPOFOL N/A 02/28/2020   focal inflammation in the gastric antrum consistent with gastritis on biopsies. No H.pylori.    EXCISION OF KELOID Left 03/26/2018   Procedure: EXCISION OF LEFT ARM MASS;  Surgeon: Claud Kelp, MD;  Location: [facility];  Service: General;  Laterality: Left;   GIVENS CAPSULE STUDY N/A 10/10/2020   Procedure: GIVENS CAPSULE STUDY;  Surgeon: Lanelle Bal, DO;  Location: [facility];  Service: Endoscopy;  Laterality: N/A;  7:30am   INGUINAL HERNIA REPAIR  1970s   Right   TOTAL MASTECTOMY Right 02/20/2021   Procedure: RIGHT TOTAL MASTECTOMY;  Surgeon: Emelia Loron, MD;  Location: [facility];  Service: General;  Laterality:  Right;   VAGINAL HYSTERECTOMY  1980   Unilateral oophorectomy    SOCIAL HISTORY:  Social History   Socioeconomic History   Marital status: Divorced    Spouse name: Not on file   Number of children: 3   Years of education: Not on file   Highest education level: Not on file  Occupational History   Occupation: Retired    Comment: Factory work  Tobacco Use   Smoking status: Former    Current packs/day: 0.00    Average packs/day: 1 pack/day for 18.0 years (18.0 ttl pk-yrs)    Types: Cigarettes    Start date: 06/02/1965    Quit date: 06/09/1980    Years since quitting: 42.7   Smokeless tobacco: Never  Vaping Use   Vaping status: Never Used  Substance and Sexual Activity   Alcohol use: No    Alcohol/week: 0.0 standard drinks of alcohol   Drug use: No   Sexual activity: Not Currently  Other Topics Concern   Not on file  Social History Narrative   Lives with daughter    Social Determinants of Health   Financial Resource Strain: Low Risk  (06/04/2021)   Overall Financial Resource Strain (CARDIA)    Difficulty of Paying Living Expenses: Not hard at all  Food Insecurity: No Food Insecurity (01/23/2021)   Hunger Vital Sign    Worried About Running Out of Food in the Last Year: Never true    Ran Out of Food in the Last Year: Never true  Transportation Needs: No Transportation Needs (01/23/2021)   PRAPARE - Administrator, Civil Service (Medical): No    Lack of Transportation (Non-Medical): No  Physical Activity: Inactive (06/04/2021)   Exercise Vital Sign    Days of Exercise per Week: 0 days    Minutes of Exercise per Session: 0 min  Stress: Stress Concern Present (06/04/2021)   [employer] of Occupational Health - Occupational Stress Questionnaire  Feeling of Stress : To some extent  Social Connections: Moderately Isolated (06/04/2021)   Social Connection and Isolation Panel [NHANES]    Frequency of Communication with Friends and Family: More than three times a  week    Frequency of Social Gatherings with Friends and Family: More than three times a week    Attends Religious Services: More than 4 times per year    Active Member of [employer] or Organizations: No    Attends Banker Meetings: Never    Marital Status: Divorced  Catering manager Violence: Not At Risk (06/04/2021)   Humiliation, Afraid, Rape, and Kick questionnaire    Fear of Current or Ex-Partner: No    Emotionally Abused: No    Physically Abused: No    Sexually Abused: No    FAMILY HISTORY:  Family History  Problem Relation Age of Onset   Heart disease Mother        also hypertension and asthma   Lung cancer Mother        dx 44s   Ovarian cancer Mother        dx before 50   Diabetes Half-Sister    Breast cancer Half-Sister 17   Multiple myeloma Nephew 53       war-related exposures   Colon cancer Neg Hx    Colon polyps Neg Hx     CURRENT MEDICATIONS:  Current Outpatient Medications  Medication Sig Dispense Refill   acetaminophen (TYLENOL) 500 MG tablet Take 1,000 mg by mouth every 6 (six) hours as needed for moderate pain or headache.     amLODipine (NORVASC) 10 MG tablet TAKE ONE TABLET BY MOUTH EVERYDAY AT BEDTIME 90 tablet 3   aspirin EC 81 MG tablet Take 81 mg by mouth daily. Swallow whole.     carvedilol (COREG) 6.25 MG tablet TAKE ONE TABLET BY MOUTH BEFORE BREAKFAST and TAKE ONE TABLET BY MOUTH EVERYDAY AT BEDTIME 180 tablet 3   Cholecalciferol (VITAMIN D3) 50 MCG (2000 UT) TABS TAKE ONE TABLET BY MOUTH daily in addition TO THE 1,000unit FOR total of 3,000units daily 30 tablet 1   Cholecalciferol 25 MCG (1000 UT) tablet TAKE ONE TABLET BY MOUTH daily along with THE 2,000unit FOR A total of 3,000units daily 30 tablet 1   citalopram (CELEXA) 20 MG tablet Take 20 mg by mouth daily.     irbesartan-hydrochlorothiazide (AVALIDE) 300-12.5 MG tablet Take 1 tablet by mouth daily before breakfast. 90 tablet 3   lansoprazole (PREVACID) 30 MG capsule TAKE ONE CAPSULE  BY MOUTH EVERY MORNING 180 capsule 3   metFORMIN (GLUCOPHAGE) 500 MG tablet Take 500 mg by mouth at bedtime.     mirtazapine (REMERON) 7.5 MG tablet Take 1 tablet (7.5 mg total) by mouth at bedtime. For depression and weight loss. 30 tablet 3   polyethylene glycol powder (GLYCOLAX/MIRALAX) 17 GM/SCOOP powder Take 1/2 capful daily as needed. Make sure to take a dose if you go more than 48 hours without a bowel movement. (Patient not taking: Reported on 02/09/2023) 255 g 3   rosuvastatin (CRESTOR) 20 MG tablet TAKE ONE TABLET BY MOUTH EVERYDAY AT BEDTIME 90 tablet 3   spironolactone (ALDACTONE) 25 MG tablet Take 25 mg by mouth every morning. (Patient not taking: Reported on 02/09/2023)     triamcinolone ointment (KENALOG) 0.1 % Apply topically 2 (two) times daily as needed.     triamcinolone ointment (KENALOG) 0.5 % Apply 1 application  topically 2 (two) times daily as needed (rash).  Turmeric 500 MG CAPS Take 500 mg by mouth daily.     No current facility-administered medications for this visit.    ALLERGIES:  Allergies  Allergen Reactions   Protonix [Pantoprazole Sodium]     DRY LIPS    LABORATORY DATA:  I have reviewed the labs as listed.     Latest Ref Rng & Units 02/09/2023    9:25 AM 07/23/2022    1:03 PM 07/03/2022    2:11 PM  CBC  WBC 4.0 - 10.5 K/uL 7.6  14.6  7.8   Hemoglobin 12.0 - 15.0 g/dL 36.1  44.3  15.4   Hematocrit 36.0 - 46.0 % 40.1  41.5  37.6   Platelets 150 - 400 K/uL 226  296  282       Latest Ref Rng & Units 02/09/2023    9:25 AM 07/23/2022    1:03 PM 07/03/2022    2:11 PM  CMP  Glucose 70 - 99 mg/dL 008  676  86   BUN 8 - 23 mg/dL 21  29  20    Creatinine 0.44 - 1.00 mg/dL 1.95  0.93  2.67   Sodium 135 - 145 mmol/L 136  131  141   Potassium 3.5 - 5.1 mmol/L 3.9  4.2  4.4   Chloride 98 - 111 mmol/L 99  94  100   CO2 22 - 32 mmol/L 27  27  26    Calcium 8.9 - 10.3 mg/dL 9.1  8.9  9.2   Total Protein 6.5 - 8.1 g/dL 7.1  6.7    Total Bilirubin 0.3 - 1.2 mg/dL 0.7   0.7    Alkaline Phos 38 - 126 U/L 61  55    AST 15 - 41 U/L 21  27    ALT 0 - 44 U/L 13  23      DIAGNOSTIC IMAGING:  I have independently reviewed the scans and discussed with the patient. No results found.   WRAP UP:  All questions were answered. The patient knows to call the clinic with any problems, questions or concerns.  Medical decision making: Moderate   Time spent on visit: I spent 20 minutes counseling the patient face to face. The total time spent in the appointment was 30 minutes and more than 50% was on counseling.  Carnella Guadalajara, PA-C  02/17/23 2:06 PM

## 2023-02-17 NOTE — Patient Instructions (Signed)
[facility] at [facility] **VISIT SUMMARY & IMPORTANT INSTRUCTIONS **   You were seen today by Rojelio Brenner PA-C for your follow-up visit.    HISTORY OF BREAST CANCER You did not have any evidence of returning breast cancer based on your labs and physical exam. You will be due for an MRI of your breast in December 2024.  IRON DEFICIENCY Prescription sent to your pharmacy for you to restart iron tablet (ferrous sulfate 325 mg) daily.  VITAMIN D DEFICIENCY Your vitamin D level looks great! Continue taking vitamin D3 1000 units daily.  WEIGHT LOSS & LOW APPETITE Continue taking Mirtazepine as an appetite stimulant. Please drink 2-3 Ensure each day.  OTHER CONCERNS DEPRESSION: Continue to follow-up with your primary care doctor for management of your antidepressant medication.  Your primary care doctor can also help you to find a therapist or counselor. TREMOR: Continue to follow-up with your neurologist (Dr. Everlena Cooper).  You may be able to take medication to help your tremor in the future. SCIATICA: Please discuss this with your primary care doctor.  They may be able to refer you to an orthopedic specialist or physical therapist who could help with your symptoms.  UPCOMING TESTS & FOLLOW-UP APPOINTMENTS: - MRI of your breast in December 2024 - Labs and office visit in 6 months (March 2025)  ** Thank you for trusting me with your healthcare!  I strive to provide all of my patients with quality care at each visit.  If you receive a survey for this visit, I would be so grateful to you for taking the time to provide feedback.  Thank you in advance!  ~ Marylon Verno                   Dr. Doreatha Massed   &   Rojelio Brenner, PA-C   - - - - - - - - - - - - - - - - - -    Thank you for choosing [facility] at [facility] to provide your oncology and hematology care.  To afford each patient quality time with our provider, please arrive at  least 15 minutes before your scheduled appointment time.   If you have a lab appointment with the Cancer Center please come in thru the Main Entrance and check in at the main information desk.  You need to re-schedule your appointment should you arrive 10 or more minutes late.  We strive to give you quality time with our providers, and arriving late affects you and other patients whose appointments are after yours.  Also, if you no show three or more times for appointments you may be dismissed from the clinic at the providers discretion.     Again, thank you for choosing [facility].  Our hope is that these requests will decrease the amount of time that you wait before being seen by our physicians.       _____________________________________________________________  Should you have questions after your visit to [facility], please contact our office at [phone] and follow the prompts.  Our office hours are 8:00 a.m. and 4:30 p.m. Monday - Friday.  Please note that voicemails left after 4:00 p.m. may not be returned until the following business day.  We are closed weekends and major holidays.  You do have access to a nurse 24-7, just call the main number to the clinic [phone] and do not press any options, hold on  the line and a nurse will answer the phone.    For prescription refill requests, have your pharmacy contact our office and allow 72 hours.

## 2023-03-09 ENCOUNTER — Ambulatory Visit (HOSPITAL_COMMUNITY)
Admission: RE | Admit: 2023-03-09 | Discharge: 2023-03-09 | Disposition: A | Discharge status: Home / Self Care | Payer: PPO | Source: Ambulatory Visit | Attending: Internal Medicine | Admitting: Internal Medicine

## 2023-03-09 ENCOUNTER — Ambulatory Visit: Payer: PPO | Admitting: Internal Medicine

## 2023-03-09 ENCOUNTER — Encounter: Payer: Self-pay | Admitting: Internal Medicine

## 2023-03-09 ENCOUNTER — Telehealth: Payer: Self-pay | Admitting: Internal Medicine

## 2023-03-09 VITALS — BP 130/80 | HR 81 | Ht 62.5 in | Wt 145.0 lb

## 2023-03-09 DIAGNOSIS — R053 Chronic cough: Secondary | ICD-10-CM | POA: Insufficient documentation

## 2023-03-09 DIAGNOSIS — J479 Bronchiectasis, uncomplicated: Secondary | ICD-10-CM | POA: Diagnosis not present

## 2023-03-09 DIAGNOSIS — R059 Cough, unspecified: Secondary | ICD-10-CM | POA: Diagnosis not present

## 2023-03-09 MED ORDER — METHYLPREDNISOLONE ACETATE 80 MG/ML IJ SUSP
120.0000 mg | Freq: Once | INTRAMUSCULAR | Status: AC
Start: 2023-03-09 — End: 2023-03-09
  Administered 2023-03-09: 120 mg via INTRAMUSCULAR

## 2023-03-09 MED ORDER — BENZONATATE 200 MG PO CAPS
200.0000 mg | ORAL_CAPSULE | Freq: Three times a day (TID) | ORAL | 1 refills | Status: DC | PRN
Start: 2023-03-09 — End: 2023-07-01

## 2023-03-09 NOTE — Telephone Encounter (Signed)
Left message for patient to call and schedule the 6 week follow up appointment as requested by Dr.Wert (per 03/09/23 AVS)

## 2023-03-09 NOTE — Assessment & Plan Note (Signed)
Onset 2008/worse since summer 2023 with bronchiectasis by   Chest  HRCT  10/20/19  - 07/03/2022 try max rx for gerd and pnds with 1st gen H1 blockers per guidelines   - Allergy screen 07/03/2022 >  Eos 0.3 /  IgE  29 - Cycilcal cough regimen 07/17/2022 >>> resolved as of 08/25/2022 > continue gerd rx  - MRI sinus May 3 /2024 No significant paranasal sinus disease.    Rx 03/09/2023 >>>   bed blocks and Delysm/ tesslon prn / depomedrol 120 mg and 1st gen H1 blockers per guidelines    Comment: Of the three most common causes of  Sub-acute / recurrent or chronic cough, only one (GERD)  can actually contribute to/ trigger  the other two (asthma and post nasal drip syndrome)  and perpetuate the cylce of cough.  While not intuitively obvious, many patients with chronic low grade reflux do not cough until there is a primary insult that disturbs the protective epithelial barrier and exposes sensitive nerve endings.   This is typically viral but can due to PNDS and  either may apply here.   The point is that once this occurs, it is difficult to eliminate the cycle  using anything but a maximally effective acid suppression regimen at least in the short run, accompanied by an appropriate diet to address non acid GERD and control pnds with 1st gen H1 blockers per guidelines   / eliminate the cough itself for at least 3 days using tessalon and delsym >>> also Depomedrol 120 mg IM  in case of component of Th-2 driven upper or lower airways inflammation (if cough responds short term only to relapse before return while will on full rx for uacs (as above), then  that would point to allergic rhinitis/ asthma or eos bronchitis as alternative dx)   F/u  6 weeks with all meds in hand using a trust but verify approach to confirm accurate Medication  Reconciliation The principal here is that until we are certain that the  patients are doing what we've asked, it makes no sense to ask them to do more.          Each maintenance  medication was reviewed in detail including emphasizing most importantly the difference between maintenance and prns and under what circumstances the prns are to be triggered using an action plan format where appropriate.  Total time for H and P, chart review, counseling,  and generating customized AVS unique to this office visit / same day charting  > 30 min for   refractory respiratory  symptoms of uncertain etiology

## 2023-03-09 NOTE — Progress Notes (Signed)
Nancy Liu, female    DOB: 1940/10/25    MRN: [id number]   Brief patient profile:  38 yobf quit smoking 1989   self referred to pulmonary clinic in [place]  07/03/2022  for refractory cough flared summer 2023 but onset per record was really 2008   Has seen Dr Craige Cotta with working dx of bronchiectasis by HRCT  10/20/19    History of Present Illness  07/03/2022  Pulmonary/ 1st Liu eval/ Nancy Liu / [place] [facility]  Chief Complaint  Patient presents with   Acute Visit    Ongoing "bad cough"  Patient states no improvement and lately the cough causes her nose to run. Neg covid test   Dyspnea:  also onset with cough/ esp steps now much more difficult  Cough: urge to clear throat during the day / worse immediately at hs/ assoc with watery pnds no better on H1/ assoc hoarseness and throat discomfort  Sleep: nightly  SABA use: don't help 02: none  Rec Continue prevacid 30 mg Take 30- 60 min before your first and last meals of the day  GERD diet reviewed, bed blocks rec  For drainage / throat tickle try take CHLORPHENIRAMINE  4 mg   Depomerdrol 120 mg IM today  For cough > mucinex dm 1200 mg every 12 hours as needed  Return to this clinic if not better with all medications with you in 2 weeks    07/17/2022  f/u ov/Nancy Liu/Nancy Liu re: chronic cough  maint on gerd rx/ h1 / tessalon/ depomedrol 120 >>> no better   Chief Complaint  Patient presents with   Follow-up    Coughing a lot. Daughter is requesting that patient has an X Ray done on throat and chest area and states her cough is not getting any better at all   Dyspnea:  grocery  store walking makes her sob x sev years  Cough: wakes up due to 5 am  cough to point of gag but nothing usually speck or two of clear mucs produced  Sleeping: cough at hs but resolves  during the night SABA use: none  02: none  Rec GERD diet reviewed, bed blocks rec  Take delsym two tsp every 12 hours and supplement if needed with  tramadol 50 mg  up to 1-2 every 4 hours Prednisone 10 mg take  4 each am x 2 days,   2 each am x 2 days,  1 each am x 2 days and stop Cxr ok     02/09/2023  53m  f/u ov/Nancy Liu/Nancy Liu re: cough x 2008/ bronchiectasis on HRCT  maint on gerd rx only > resolved  Chief Complaint  Patient presents with   Follow-up    Follow up coughing is better    Dyspnea:  walking x 10 min flat outdoors no cp/ sob  Cough: none at present  Sleeping: bed is flat/ 3 pillows  s  resp cc  SABA use: none  02: none   Rec For cough > mucinex dm 1200 mg every 12 hours as needed      03/09/2023  f/u ov/[place] Liu/Nancy Liu re: cough x 2008 flare x 1 weeks dry maint on prevacid 30mg   bid  / no longer using 1st gen H1 blockers per guidelines   Chief Complaint  Patient presents with   Cough    X1week, denies fevers/chills/shob   Dyspnea:  about 10 min fast pace flat surface  Cough: after supper tends to flare but quiets down, then wakes  her up 6 am "hard coughing" > min mucoid assoc with midline cp during severe coughing fits  Sleeping: bed is flat/ sev pillows  s resp cc  SABA use: none  02: none      No obvious day to day or daytime variability or assoc excess/ purulent sputum or mucus plugs or hemoptysis or cp or chest tightness, subjective wheeze or overt sinus or hb symptoms.    Also denies any obvious fluctuation of symptoms with weather or environmental changes or other aggravating or alleviating factors except as outlined above   No unusual exposure hx or h/o childhood pna/ asthma or knowledge of premature birth.  Current Allergies, Complete Past Medical History, Past Surgical History, Family History, and Social History were reviewed in Nancy Liu record.  ROS  The following are not active complaints unless bolded Hoarseness, sore throat, dysphagia, dental problems, itching, sneezing,  nasal congestion or discharge of excess mucus or purulent secretions, ear ache,   fever, chills,  sweats, unintended wt loss or wt gain, classically pleuritic or exertional cp,  orthopnea pnd or arm/hand swelling  or leg swelling, presyncope, palpitations, abdominal pain, anorexia, nausea, vomiting, diarrhea  or change in bowel habits or change in bladder habits, change in stools or change in urine, dysuria, hematuria,  rash, arthralgias, visual complaints, headache, numbness, weakness or ataxia or problems with walking or coordination,  change in mood or  memory.        Current Meds  Medication Sig   acetaminophen (TYLENOL) 500 MG tablet Take 1,000 mg by mouth every 6 (six) hours as needed for moderate pain or headache.   amLODipine (NORVASC) 10 MG tablet TAKE ONE TABLET BY MOUTH EVERYDAY AT BEDTIME   aspirin EC 81 MG tablet Take 81 mg by mouth daily. Swallow whole.   carvedilol (COREG) 6.25 MG tablet TAKE ONE TABLET BY MOUTH BEFORE BREAKFAST and TAKE ONE TABLET BY MOUTH EVERYDAY AT BEDTIME   Cholecalciferol (VITAMIN D3) 50 MCG (2000 UT) TABS TAKE ONE TABLET BY MOUTH daily in addition TO THE 1,000unit FOR total of 3,000units daily   Cholecalciferol 25 MCG (1000 UT) tablet TAKE ONE TABLET BY MOUTH daily along with THE 2,000unit FOR A total of 3,000units daily   citalopram (CELEXA) 20 MG tablet Take 20 mg by mouth daily.   ferrous sulfate 325 (65 FE) MG EC tablet Take 1 tablet (325 mg total) by mouth daily with breakfast.   irbesartan-hydrochlorothiazide (AVALIDE) 300-12.5 MG tablet Take 1 tablet by mouth daily before breakfast.   lansoprazole (PREVACID) 30 MG capsule TAKE ONE CAPSULE BY MOUTH EVERY MORNING   metFORMIN (GLUCOPHAGE) 500 MG tablet Take 500 mg by mouth at bedtime.   mirtazapine (REMERON) 7.5 MG tablet Take 1 tablet (7.5 mg total) by mouth at bedtime. For depression and weight loss.   polyethylene glycol powder (GLYCOLAX/MIRALAX) 17 GM/SCOOP powder Take 1/2 capful daily as needed. Make sure to take a dose if you go more than 48 hours without a bowel movement.   rosuvastatin  (CRESTOR) 20 MG tablet TAKE ONE TABLET BY MOUTH EVERYDAY AT BEDTIME   spironolactone (ALDACTONE) 25 MG tablet Take 25 mg by mouth every morning.   triamcinolone ointment (KENALOG) 0.1 % Apply topically 2 (two) times daily as needed.   triamcinolone ointment (KENALOG) 0.5 % Apply 1 application  topically 2 (two) times daily as needed (rash).   Turmeric 500 MG CAPS Take 500 mg by mouth daily.  Past Medical History:  Diagnosis Date   Arteriosclerotic cardiovascular disease (ASCVD) 2001   Bare-metal stent placed in the right coronary artery in 12/01; residual 50% lesion of the first diagonal and mid LAD   Breast cancer (HCC)    Cerebrovascular disease    COPD (chronic obstructive pulmonary disease) (HCC)    Cyst, dermoid, arm, left 03/26/2018   Diabetes mellitus    excellent control with a low-dose of a single oral agent   Family history of breast cancer 01/23/2021   Family history of ovarian cancer 01/23/2021   GERD (gastroesophageal reflux disease)    with ulcers   History of right coronary artery stent placement 2000   Hyperlipidemia    Hypertension    Sleep apnea    Thalassemia minor    Tobacco abuse, in remission    35 pack years; Quit in 1980       Objective:    Wts  03/09/2023       145  02/09/2023         151  08/25/2022       144   07/17/22 142 lb 6.4 oz (64.6 kg)  07/16/22 144 lb (65.3 kg)  07/03/22 146 lb 6.4 oz (66.4 kg)     Vital signs reviewed  03/09/2023  - Note at rest 02 sats  97% on RA   General appearance:    somber amb bf, no spont cough during interview or exam   HEENT :  several decayed molars, orophx clear, nasal turbinates nl/ ears clear/no cough reflex    NECK :  without  apparent JVD/ palpable Nodes/TM    LUNGS: no acc muscle use,  Nl contour chest which is clear to A and P bilaterally without cough on insp or exp maneuvers   CV:  RRR  no s3 or murmur or increase in P2, and no edema   ABD:  soft and nontender with nl inspiratory  excursion in the supine position. No bruits or organomegaly appreciated   MS:  Nl gait/ ext warm without deformities Or obvious joint restrictions  calf tenderness, cyanosis or clubbing    SKIN: warm and dry without lesions    NEURO:  alert, approp, nl sensorium with  no motor or cerebellar deficits apparent.      CXR PA and Lateral:   03/09/2023 :    I personally reviewed images and impression is as follows:     Mild kyphosis/ no acute findings Assessment

## 2023-03-09 NOTE — Patient Instructions (Addendum)
Prevacid 30 mg Take 30- 60 min before your first and last meals of the day   GERD (REFLUX)  is an extremely common cause of respiratory symptoms just like yours , many times with no obvious heartburn at all.    It can be treated with medication, but also with lifestyle changes including elevation of the head of your bed (ideally with 6 -8inch blocks under the headboard of your bed),  Smoking cessation, avoidance of late meals, excessive alcohol, and avoid fatty foods, chocolate, peppermint, colas, red wine, and acidic juices such as orange juice.  NO MINT OR MENTHOL PRODUCTS SO NO COUGH DROPS  USE SUGARLESS CANDY INSTEAD (Jolley ranchers or Stover's or Life Savers) or even ice chips will also do - the key is to swallow to prevent all throat clearing. NO OIL BASED VITAMINS - use powdered substitutes.  Avoid fish oil when coughing.    Take delsym (over the counter)  two tsp every 12 hours and supplement if needed with tessalon 200 mg up to three times daily  to suppress the urge to cough. Swallowing water and/or using ice chips/non mint and menthol containing candies (such as lifesavers or sugarless jolly ranchers) are also effective.  You should rest your voice and avoid activities that you know make you cough.  Once you have eliminated the cough for 3 straight days try reducing the tessalon,  then the delsym as tolerated.    For  throat tickle and evening cough  try take CHLORPHENIRAMINE  4 mg  ("Allergy Relief" 4mg   at [facility] should be easiest to find in the blue box usually on bottom shelf)  take one every 4 hours as needed - extremely effective and inexpensive over the counter- may cause drowsiness so start with just a dose or two an hour before bedtime and see how you tolerate it before trying in daytime.   Depomedrol 120 mg IM   Please remember to go to the  x-ray department  @  [facility] for your tests - we will call you with the results when they are available      Please schedule a follow up office visit in 6 weeks, call sooner if needed with all medications /inhalers/ solutions in hand so we can verify exactly what you are taking. This includes all medications from all doctors and over the counters

## 2023-03-10 DIAGNOSIS — H04123 Dry eye syndrome of bilateral lacrimal glands: Secondary | ICD-10-CM | POA: Diagnosis not present

## 2023-03-10 DIAGNOSIS — H47233 Glaucomatous optic atrophy, bilateral: Secondary | ICD-10-CM | POA: Diagnosis not present

## 2023-03-10 DIAGNOSIS — H401131 Primary open-angle glaucoma, bilateral, mild stage: Secondary | ICD-10-CM | POA: Diagnosis not present

## 2023-03-10 DIAGNOSIS — H2513 Age-related nuclear cataract, bilateral: Secondary | ICD-10-CM | POA: Diagnosis not present

## 2023-03-10 NOTE — Telephone Encounter (Signed)
Patient is traveling to another appointment and will call me back later to schedule

## 2023-03-11 NOTE — Telephone Encounter (Signed)
Patient will call back tomorrow 03/12/23 to schedule ----her daughter does not have her work schedule with her right now

## 2023-04-15 ENCOUNTER — Inpatient Hospital Stay: Payer: PPO | Admitting: Physician Assistant

## 2023-04-17 ENCOUNTER — Ambulatory Visit: Payer: PPO | Attending: Cardiology | Admitting: Cardiology

## 2023-04-17 ENCOUNTER — Encounter: Payer: Self-pay | Admitting: Cardiology

## 2023-04-17 VITALS — BP 128/76 | HR 78 | Ht 62.5 in | Wt 148.0 lb

## 2023-04-17 DIAGNOSIS — R079 Chest pain, unspecified: Secondary | ICD-10-CM | POA: Diagnosis not present

## 2023-04-17 DIAGNOSIS — I2510 Atherosclerotic heart disease of native coronary artery without angina pectoris: Secondary | ICD-10-CM | POA: Diagnosis not present

## 2023-04-17 DIAGNOSIS — E782 Mixed hyperlipidemia: Secondary | ICD-10-CM | POA: Diagnosis not present

## 2023-04-17 NOTE — Progress Notes (Signed)
Clinical Summary Nancy Liu is a 82 y.o.female  seen today for follow up of the following medical problems.   1. CAD   - prior BMS to RCA in 2001 at [facility]   - 02/2011 MPI no ischemia   - echo 04/2013 showed LVEF 60-65% with grade I diastolic dysfunction. - 03/2020 nuclear stress no ischemia    09/2021 echo LVEF 60-65%, grade I dd   - some recent chest pain. About 1-2 times per week. Tightness midchest/epigastric, can come on with exertion. 2-3/10 in severity. Some associated fatigue. DOE < 1 block that is chronic. Lasts few seconds. At times worst with deep breathing - compliant with meds      2. HTN   - she is compliant with meds.    3. Hyperlpidiemia    - 08/2020 TC 123 HDL 51 TG 88 LDL 55 - pcp follows labs.    4. SOB/Abnormal PFTs/Bronchiectasis - former smoker x 30-35 years. - PFTs Jan 2017 with severely redcued DLCO   - now follwoed by Dr Craige Cotta,   5. Anemia -followed by hematology    6. Aortic valve sclerosis - 09/2021 echo with sclerosis, no significant stenosis     SH: has 3 children. 2 grandchilren, one is a Engineer, civil (consulting) working at childrens [facility] in [place]. Nancy Liu grandbaby is on the way in November.      Past Medical History:  Diagnosis Date   Arteriosclerotic cardiovascular disease (ASCVD) 2001   Bare-metal stent placed in the right coronary artery in 12/01; residual 50% lesion of the first diagonal and mid LAD   Breast cancer (HCC)    Cerebrovascular disease    COPD (chronic obstructive pulmonary disease) (HCC)    Cyst, dermoid, arm, left 03/26/2018   Diabetes mellitus    excellent control with a low-dose of a single oral agent   Family history of breast cancer 01/23/2021   Family history of ovarian cancer 01/23/2021   GERD (gastroesophageal reflux disease)    with ulcers   History of right coronary artery stent placement 2000   Hyperlipidemia    Hypertension    Sleep apnea    Thalassemia minor    Tobacco abuse, in remission    35  pack years; Quit in 1980     Allergies  Allergen Reactions   Protonix [Pantoprazole Sodium]     DRY LIPS     Current Outpatient Medications  Medication Sig Dispense Refill   acetaminophen (TYLENOL) 500 MG tablet Take 1,000 mg by mouth every 6 (six) hours as needed for moderate pain or headache.     amLODipine (NORVASC) 10 MG tablet TAKE ONE TABLET BY MOUTH EVERYDAY AT BEDTIME 90 tablet 3   aspirin EC 81 MG tablet Take 81 mg by mouth daily. Swallow whole.     benzonatate (TESSALON) 200 MG capsule Take 1 capsule (200 mg total) by mouth 3 (three) times daily as needed for cough. 30 capsule 1   carvedilol (COREG) 6.25 MG tablet TAKE ONE TABLET BY MOUTH BEFORE BREAKFAST and TAKE ONE TABLET BY MOUTH EVERYDAY AT BEDTIME 180 tablet 3   Cholecalciferol (VITAMIN D3) 50 MCG (2000 UT) TABS TAKE ONE TABLET BY MOUTH daily in addition TO THE 1,000unit FOR total of 3,000units daily 30 tablet 1   Cholecalciferol 25 MCG (1000 UT) tablet TAKE ONE TABLET BY MOUTH daily along with THE 2,000unit FOR A total of 3,000units daily 30 tablet 1   citalopram (CELEXA) 20 MG tablet Take 20 mg  by mouth daily.     ferrous sulfate 325 (65 FE) MG EC tablet Take 1 tablet (325 mg total) by mouth daily with breakfast. 90 tablet 3   irbesartan-hydrochlorothiazide (AVALIDE) 300-12.5 MG tablet Take 1 tablet by mouth daily before breakfast. 90 tablet 3   lansoprazole (PREVACID) 30 MG capsule TAKE ONE CAPSULE BY MOUTH EVERY MORNING 180 capsule 3   metFORMIN (GLUCOPHAGE) 500 MG tablet Take 500 mg by mouth at bedtime.     mirtazapine (REMERON) 7.5 MG tablet Take 1 tablet (7.5 mg total) by mouth at bedtime. For depression and weight loss. 30 tablet 3   polyethylene glycol powder (GLYCOLAX/MIRALAX) 17 GM/SCOOP powder Take 1/2 capful daily as needed. Make sure to take a dose if you go more than 48 hours without a bowel movement. 255 g 3   rosuvastatin (CRESTOR) 20 MG tablet TAKE ONE TABLET BY MOUTH EVERYDAY AT BEDTIME 90 tablet 3    spironolactone (ALDACTONE) 25 MG tablet Take 25 mg by mouth every morning.     triamcinolone ointment (KENALOG) 0.1 % Apply topically 2 (two) times daily as needed.     triamcinolone ointment (KENALOG) 0.5 % Apply 1 application  topically 2 (two) times daily as needed (rash).     Turmeric 500 MG CAPS Take 500 mg by mouth daily.     No current facility-administered medications for this visit.     Past Surgical History:  Procedure Laterality Date   BIOPSY  02/28/2020   Procedure: BIOPSY;  Surgeon: Lanelle Bal, DO;  [facility];  Service: Endoscopy;;   COLONOSCOPY  2006   Dr. Loreta Ave: normal   COLONOSCOPY  2000   Dr. Tad Moore: normal    COLONOSCOPY N/A 03/03/2016   Dr. Darrick Penna: diverticulosis, non-bleeding hemorrhoids, redundant left colon.   DILATION AND CURETTAGE OF UTERUS     ESOPHAGOGASTRODUODENOSCOPY  2000   Dr. Tad Moore: gastritis    ESOPHAGOGASTRODUODENOSCOPY  2006   Dr. Loreta Ave: small hiatal hernia, gastritis, negative H.pylori    ESOPHAGOGASTRODUODENOSCOPY N/A 03/03/2016   Procedure: ESOPHAGOGASTRODUODENOSCOPY (EGD);  Surgeon: West Bali, MD;  Location: [facility];  Service: Endoscopy;  Laterality: N/A;   ESOPHAGOGASTRODUODENOSCOPY N/A 06/03/2016   Dr. Darrick Penna: nonaggressive gastritis due to aspirin use. Previous ulcers had healed.   ESOPHAGOGASTRODUODENOSCOPY (EGD) WITH PROPOFOL N/A 02/28/2020   focal inflammation in the gastric antrum consistent with gastritis on biopsies. No H.pylori.    EXCISION OF KELOID Left 03/26/2018   Procedure: EXCISION OF LEFT ARM MASS;  Surgeon: Claud Kelp, MD;  Location: [facility];  Service: General;  Laterality: Left;   GIVENS CAPSULE STUDY N/A 10/10/2020   Procedure: GIVENS CAPSULE STUDY;  Surgeon: Lanelle Bal, DO;  Location: [facility];  Service: Endoscopy;  Laterality: N/A;  7:30am   INGUINAL HERNIA REPAIR  1970s   Right   TOTAL MASTECTOMY Right 02/20/2021   Procedure: RIGHT TOTAL MASTECTOMY;  Surgeon:  Emelia Loron, MD;  Location: [facility] OR;  Service: General;  Laterality: Right;   VAGINAL HYSTERECTOMY  1980   Unilateral oophorectomy     Allergies  Allergen Reactions   Protonix [Pantoprazole Sodium]     DRY LIPS      Family History  Problem Relation Age of Onset   Heart disease Mother        also hypertension and asthma   Lung cancer Mother        dx 85s   Ovarian cancer Mother        dx before  50   Diabetes Half-Sister    Breast cancer Half-Sister 20   Multiple myeloma Nephew 53       war-related exposures   Colon cancer Neg Hx    Colon polyps Neg Hx      Social History Ms. Weatherwax reports that she quit smoking about 42 years ago. Her smoking use included cigarettes. She started smoking about 57 years ago. She has a 18 pack-year smoking history. She has never used smokeless tobacco. Ms. Moronta reports no history of alcohol use.   Review of Systems CONSTITUTIONAL: No weight loss, fever, chills, weakness or fatigue.  HEENT: Eyes: No visual loss, blurred vision, double vision or yellow sclerae.No hearing loss, sneezing, congestion, runny nose or sore throat.  SKIN: No rash or itching.  CARDIOVASCULAR: per hpi RESPIRATORY: per hpi GASTROINTESTINAL: No anorexia, nausea, vomiting or diarrhea. No abdominal pain or blood.  GENITOURINARY: No burning on urination, no polyuria NEUROLOGICAL: No headache, dizziness, syncope, paralysis, ataxia, numbness or tingling in the extremities. No change in bowel or bladder control.  MUSCULOSKELETAL: No muscle, back pain, joint pain or stiffness.  LYMPHATICS: No enlarged nodes. No history of splenectomy.  PSYCHIATRIC: No history of depression or anxiety.  ENDOCRINOLOGIC: No reports of sweating, cold or heat intolerance. No polyuria or polydipsia.  Marland Kitchen   Physical Examination Today's Vitals   04/17/23 0903  BP: 128/76  Pulse: 78  SpO2: 98%  Weight: 148 lb (67.1 kg)  Height: 5' 2.5" (1.588 m)   Body mass index is 26.64  kg/m.  Gen: resting comfortably, no acute distress HEENT: no scleral icterus, pupils equal round and reactive, no palptable cervical adenopathy,  CV: RRR, 2/6 systolic murmur rusb, no jvd Resp: Clear to auscultation bilaterally GI: abdomen is soft, non-tender, non-distended, normal bowel sounds, no hepatosplenomegaly MSK: extremities are warm, no edema.  Skin: warm, no rash Neuro:  no focal deficits Psych: appropriate affect   Diagnostic Studies  02/2011 MPI   Low risk exercise/Lexiscan Myoview as outlined. Equivocal ST- segment changes were noted in the setting of lead motion artifact. No chest pain reported. Perfusion imaging is most consistent with breast attenuation without clear evidence of scar or ischemia. LVEF 77%.   04/06/13 Clinic EKG: sinus rhythm, nomral axis, normal intervals, non-specific ST/T changes   04/2013 Echo LVEF 60-65%, grade I diastolic dysfunction,   Jan 2017 PFTs: normal spirometry, minimal restrictive changes, severely decreased DLCO   09/2021 echo 1. Left ventricular ejection fraction, by estimation, is 60 to 65%. The  left ventricle has normal function. The left ventricle has no regional  wall motion abnormalities. Left ventricular diastolic parameters are  consistent with Grade I diastolic  dysfunction (impaired relaxation). The average left ventricular global  longitudinal strain is -18.1 %. The global longitudinal strain is normal.   2. Right ventricular systolic function is normal. The right ventricular  size is normal. There is normal pulmonary artery systolic pressure. The  estimated right ventricular systolic pressure is 17.6 mmHg.   3. The mitral valve is grossly normal. Trivial mitral valve  regurgitation.   4. The aortic valve was not well visualized. Aortic valve regurgitation  is not visualized. Aortic valve sclerosis/calcification is present,  without any evidence of aortic stenosis. Aortic valve mean gradient  measures 4.0 mmHg.    5. The inferior vena cava is normal in size with greater than 50%  respiratory variability, suggesting right atrial pressure of 3 mmHg.        Assessment and Plan  1. CAD -recent  chest pains symptoms, will plan for exercise nuclear stress test EKG today show   2. HTN   -at goal, continue current meds   3. Hyperlipidemia   - request labs from pcp, continue current meds      Antoine Poche, M.D.

## 2023-04-17 NOTE — Patient Instructions (Signed)
Medication Instructions:  Your physician recommends that you continue on your current medications as directed. Please refer to the Current Medication list given to you today.  *If you need a refill on your cardiac medications before your next appointment, please call your pharmacy*   Lab Work: None  If you have labs (blood work) drawn today and your tests are completely normal, you will receive your results only by: MyChart Message (if you have MyChart) OR A paper copy in the mail If you have any lab test that is abnormal or we need to change your treatment, we will call you to review the results.   Testing/Procedures: Your physician has requested that you have a exercise nuclear myoview. For further information please visit [email or web address] Please follow instruction sheet, as given.    Follow-Up: At [facility], you and your health needs are our priority.  As part of our continuing mission to provide you with exceptional heart care, we have created designated Provider Care Teams.  These Care Teams include your primary Cardiologist (physician) and Advanced Practice Providers (APPs -  Physician Assistants and Nurse Practitioners) who all work together to provide you with the care you need, when you need it.  We recommend signing up for the patient portal called "MyChart".  Sign up information is provided on this After Visit Summary.  MyChart is used to connect with patients for Virtual Visits (Telemedicine).  Patients are able to view lab/test results, encounter notes, upcoming appointments, etc.  Non-urgent messages can be sent to your provider as well.   To learn more about what you can do with MyChart, go to [email or web address].    Your next appointment:   6 month(s)  Provider:   You may see Dina Rich, MD or one of the following Advanced Practice Providers on your designated Care Team:   Randall An, PA-C  Jacolyn Reedy, [place]     Other  Instructions

## 2023-04-22 ENCOUNTER — Ambulatory Visit: Payer: PPO | Admitting: Internal Medicine

## 2023-04-22 ENCOUNTER — Encounter: Payer: Self-pay | Admitting: Internal Medicine

## 2023-04-22 NOTE — Progress Notes (Deleted)
Nancy Liu, female    DOB: [birth year]    MRN: [id number]   Brief patient profile:  72 yobf quit smoking 1989   self referred to pulmonary clinic in [place]  07/03/2022  for refractory cough flared summer 2023 but onset per record was really 2008   Has seen Nancy Liu with working dx of bronchiectasis by HRCT  10/20/19    History of Present Illness  07/03/2022  Pulmonary/ 1st Liu eval/ Nancy Liu / Nancy Liu  Chief Complaint  Patient presents with   Acute Visit    Ongoing "bad cough"  Patient states no improvement and lately the cough causes her nose to run. Neg covid test   Dyspnea:  also onset with cough/ esp steps now much more difficult  Cough: urge to clear throat during the day / worse immediately at hs/ assoc with watery pnds no better on H1/ assoc hoarseness and throat discomfort  Sleep: nightly  SABA use: don't help 02: none  Rec Continue prevacid 30 mg Take 30- 60 min before your first and last meals of the day  GERD diet reviewed, bed blocks rec  For drainage / throat tickle try take CHLORPHENIRAMINE  4 mg   Depomerdrol 120 mg IM today  For cough > mucinex dm 1200 mg every 12 hours as needed  Return to this clinic if not better with all medications with you in 2 weeks    07/17/2022  f/u ov/[name] Liu/Nancy Liu re: chronic cough  maint on gerd rx/ h1 / tessalon/ depomedrol 120 >>> no better   Chief Complaint  Patient presents with   Follow-up    Coughing a lot. Daughter is requesting that patient has an X Ray done on throat and chest area and states her cough is not getting any better at all   Dyspnea:  grocery  store walking makes her sob x sev years  Cough: wakes up due to 5 am  cough to point of gag but nothing usually speck or two of clear mucs produced  Sleeping: cough at hs but resolves  during the night SABA use: none  02: none  Rec GERD diet reviewed, bed blocks rec  Take delsym two tsp every 12 hours and supplement if needed with  tramadol 50 mg  up to 1-2 every 4 hours Prednisone 10 mg take  4 each am x 2 days,   2 each am x 2 days,  1 each am x 2 days and stop Cxr ok     02/09/2023  21m  f/u ov/[place] Liu/Nancy Liu re: cough x 2008/ bronchiectasis on HRCT  maint on gerd rx only > resolved  Chief Complaint  Patient presents with   Follow-up    Follow up coughing is better    Dyspnea:  walking x 10 min flat outdoors no cp/ sob  Cough: none at present  Sleeping: bed is flat/ 3 pillows  s  resp cc  SABA use: none  02: none   Rec For cough > mucinex dm 1200 mg every 12 hours as needed      03/09/2023  f/u ov/[place] Liu/Nancy Liu re: cough x 2008 flare x 1 weeks dry maint on prevacid 30mg   bid  / no longer using 1st gen H1 blockers per guidelines   Chief Complaint  Patient presents with   Cough    X1week, denies fevers/chills/shob   Dyspnea:  about 10 min fast pace flat surface  Cough: after supper tends to flare but quiets down, then wakes  her up 6 am "hard coughing" > min mucoid assoc with midline cp during severe coughing fits  Sleeping: bed is flat/ sev pillows  s resp cc  SABA use: none  02: none  Rec Prevacid 30 mg Take 30- 60 min before your first and last meals of the day  GERD diet reviewed, bed blocks rec  Take delsym (over the counter)  two tsp every 12 hours and supplement if needed with tessalon 200 mg up to three times daily  to suppress the urge to cough. . Once you have eliminated the cough for 3 straight days try reducing the tessalon,  then the delsym as tolerated.   For  throat tickle and evening cough  try take CHLORPHENIRAMINE  4 mg   Depomedrol 120 mg IM  Please remember to go to the  x-ray department  > cxr wnl  Please schedule a follow up Liu visit in 6 weeks, call sooner if needed with all medications /inhalers/ solutions in hand    04/22/2023  f/u ov/[place] Liu/Nancy Liu re: *** maint on *** did *** bring meds  No chief complaint on file.   Dyspnea:  *** Cough: *** Sleeping: ***    resp cc  SABA use: *** 02: ***  Lung cancer screening: ***   No obvious day to day or daytime variability or assoc excess/ purulent sputum or mucus plugs or hemoptysis or cp or chest tightness, subjective wheeze or overt sinus or hb symptoms.    Also denies any obvious fluctuation of symptoms with weather or environmental changes or other aggravating or alleviating factors except as outlined above   No unusual exposure hx or h/o childhood pna/ asthma or knowledge of premature birth.  Current Allergies, Complete Past Medical History, Past Surgical History, Family History, and Social History were reviewed in [employer] record.  ROS  The following are not active complaints unless bolded Hoarseness, sore throat, dysphagia, dental problems, itching, sneezing,  nasal congestion or discharge of excess mucus or purulent secretions, ear ache,   fever, chills, sweats, unintended wt loss or wt gain, classically pleuritic or exertional cp,  orthopnea pnd or arm/hand swelling  or leg swelling, presyncope, palpitations, abdominal pain, anorexia, nausea, vomiting, diarrhea  or change in bowel habits or change in bladder habits, change in stools or change in urine, dysuria, hematuria,  rash, arthralgias, visual complaints, headache, numbness, weakness or ataxia or problems with walking or coordination,  change in mood or  memory.        No outpatient medications have been marked as taking for the 04/22/23 encounter (Appointment) with Nancy Cowden, MD.              Past Medical History:  Diagnosis Date   Arteriosclerotic cardiovascular disease (ASCVD) 2001   Bare-metal stent placed in the right coronary artery in 12/01; residual 50% lesion of the first diagonal and mid LAD   Breast cancer [facility])    Cerebrovascular disease    COPD (chronic obstructive pulmonary disease) (HCC)    Cyst, dermoid, arm, left 03/26/2018   Diabetes mellitus    excellent control with a low-dose of a  single oral agent   Family history of breast cancer 01/23/2021   Family history of ovarian cancer 01/23/2021   GERD (gastroesophageal reflux disease)    with ulcers   History of right coronary artery stent placement 2000   Hyperlipidemia    Hypertension    Sleep apnea    Thalassemia minor  Tobacco abuse, in remission    35 pack years; Quit in 1980       Objective:    Wts  04/22/2023      *** 0/12/2022         145  02/09/2023         151  08/25/2022       144   07/17/22 142 lb 6.4 oz (64.6 kg)  07/16/22 144 lb (65.3 kg)  07/03/22 146 lb 6.4 oz (66.4 kg)    Vital signs reviewed  04/22/2023  - Note at rest 02 sats  ***% on ***   General appearance:    ***      Assessment

## 2023-04-24 ENCOUNTER — Ambulatory Visit (HOSPITAL_COMMUNITY)
Admission: RE | Admit: 2023-04-24 | Discharge: 2023-04-24 | Disposition: A | Discharge status: Home / Self Care | Payer: PPO | Source: Ambulatory Visit | Attending: Cardiology | Admitting: Cardiology

## 2023-04-24 ENCOUNTER — Encounter (HOSPITAL_COMMUNITY)
Admission: RE | Admit: 2023-04-24 | Discharge: 2023-04-24 | Disposition: A | Discharge status: Home / Self Care | Payer: PPO | Source: Ambulatory Visit | Attending: Cardiology | Admitting: Cardiology

## 2023-04-24 DIAGNOSIS — R079 Chest pain, unspecified: Secondary | ICD-10-CM | POA: Diagnosis not present

## 2023-04-24 LAB — NM MYOCAR MULTI W/SPECT W/WALL MOTION / EF
Angina Index: 0
Base ST Depression (mm): 0 mm
Duke Treadmill Score: 5
Estimated workload: 7
Exercise duration (min): 5 min
Exercise duration (sec): 16 s
LV dias vol: 35 mL (ref 46–106)
LV sys vol: 4 mL
MPHR: 139 {beats}/min
Nuc Stress EF: 87 %
Peak HR: 120 {beats}/min
Percent HR: 86 %
RATE: 0.5
RPE: 17
Rest HR: 80 {beats}/min
Rest Nuclear Isotope Dose: 10.4 mCi
SDS: 3
SRS: 2
SSS: 5
ST Depression (mm): 0 mm
Stress Nuclear Isotope Dose: 31.4 mCi
TID: 0.85

## 2023-04-24 MED ORDER — REGADENOSON 0.4 MG/5ML IV SOLN
INTRAVENOUS | Status: AC
  Filled 2023-04-24: qty 5

## 2023-04-24 MED ORDER — TECHNETIUM TC 99M TETROFOSMIN IV KIT
31.4000 | PACK | Freq: Once | INTRAVENOUS | Status: AC | PRN
  Administered 2023-04-24: 31.4 via INTRAVENOUS

## 2023-04-24 MED ORDER — TECHNETIUM TC 99M TETROFOSMIN IV KIT
10.4000 | PACK | Freq: Once | INTRAVENOUS | Status: AC | PRN
  Administered 2023-04-24: 10.4 via INTRAVENOUS

## 2023-04-24 MED ORDER — SODIUM CHLORIDE FLUSH 0.9 % IV SOLN
INTRAVENOUS | Status: AC
  Administered 2023-04-24: 10 mL via INTRAVENOUS
  Filled 2023-04-24: qty 10

## 2023-05-18 ENCOUNTER — Ambulatory Visit (HOSPITAL_COMMUNITY)
Admission: RE | Admit: 2023-05-18 | Discharge: 2023-05-18 | Disposition: A | Discharge status: Home / Self Care | Payer: PPO | Source: Ambulatory Visit | Attending: Physician Assistant | Admitting: Physician Assistant

## 2023-05-18 DIAGNOSIS — D0511 Intraductal carcinoma in situ of right breast: Secondary | ICD-10-CM

## 2023-05-18 DIAGNOSIS — N6091 Unspecified benign mammary dysplasia of right breast: Secondary | ICD-10-CM

## 2023-05-18 DIAGNOSIS — C50911 Malignant neoplasm of unspecified site of right female breast: Secondary | ICD-10-CM | POA: Diagnosis not present

## 2023-05-18 MED ORDER — GADOBUTROL 1 MMOL/ML IV SOLN
6.0000 mL | Freq: Once | INTRAVENOUS | Status: AC | PRN
  Administered 2023-05-18: 6 mL via INTRAVENOUS

## 2023-06-07 NOTE — Progress Notes (Deleted)
 Nancy Liu, female    DOB: 1941-03-13    MRN: [id number]   Brief patient profile:  54 yobf quit smoking 1989   self referred to pulmonary clinic in [place]  07/03/2022  for refractory cough flared summer 2023 but onset per record was really 2008   Has seen Dr Shellia with working dx of bronchiectasis by HRCT  10/20/19    History of Present Illness  07/03/2022  Pulmonary/ 1st office eval/ Nancy Liu / [place] [facility]  Chief Complaint  Patient presents with   Acute Visit    Ongoing bad cough  Patient states no improvement and lately the cough causes her nose to run. Neg covid test   Dyspnea:  also onset with cough/ esp steps now much more difficult  Cough: urge to clear throat during the day / worse immediately at hs/ assoc with watery pnds no better on H1/ assoc hoarseness and throat discomfort  Sleep: nightly  SABA use: don't help 02: none  Rec Continue prevacid  30 mg Take 30- 60 min before your first and last meals of the day  GERD diet reviewed, bed blocks rec  For drainage / throat tickle try take CHLORPHENIRAMINE  4 mg   Depomerdrol 120 mg IM today  For cough > mucinex  dm 1200 mg every 12 hours as needed  Return to this clinic if not better with all medications with you in 2 weeks    07/17/2022  f/u ov/[place] office/Nancy Liu re: chronic cough  maint on gerd rx/ h1 / tessalon / depomedrol 120 >>> no better   Chief Complaint  Patient presents with   Follow-up    Coughing a lot. Daughter is requesting that patient has an X Ray done on throat and chest area and states her cough is not getting any better at all   Dyspnea:  grocery  store walking makes her sob x sev years  Cough: wakes up due to 5 am  cough to point of gag but nothing usually speck or two of clear mucs produced  Sleeping: cough at hs but resolves  during the night SABA use: none  02: none  Rec GERD diet reviewed, bed blocks rec  Take delsym  two tsp every 12 hours and supplement if needed with  tramadol  50 mg  up to 1-2 every 4 hours Prednisone  10 mg take  4 each am x 2 days,   2 each am x 2 days,  1 each am x 2 days and stop Cxr ok     02/09/2023  24m  f/u ov/[place] office/Nancy Liu re: cough x 2008/ bronchiectasis on HRCT  maint on gerd rx only > resolved  Chief Complaint  Patient presents with   Follow-up    Follow up coughing is better    Dyspnea:  walking x 10 min flat outdoors no cp/ sob  Cough: none at present  Sleeping: bed is flat/ 3 pillows  s  resp cc  SABA use: none  02: none   Rec For cough > mucinex  dm 1200 mg every 12 hours as needed      03/09/2023  f/u ov/[place] office/Nancy Liu re: cough x 2008 flare x 1 weeks dry maint on prevacid  30mg   bid  / no longer using 1st gen H1 blockers per guidelines   Chief Complaint  Patient presents with   Cough    X1week, denies fevers/chills/shob   Dyspnea:  about 10 min fast pace flat surface  Cough: after supper tends to flare but quiets down, then wakes  her up 6 am hard coughing > min mucoid assoc with midline cp during severe coughing fits  Sleeping: bed is flat/ sev pillows  s resp cc  SABA use: none  02: none Rec Prevacid  30 mg Take 30- 60 min before your first and last meals of the day  GERD diet reviewed, bed blocks rec  Take delsym  (over the counter)  two tsp every 12 hours and supplement if needed with tessalon  200 mg up to three times daily  t Once you have eliminated the cough for 3 straight days try reducing the tessalon ,  then the delsym  as tolerated.   For  throat tickle and evening cough  try take CHLORPHENIRAMINE  4 mg   Depomedrol 120 mg IM   Please remember to go to the  x-ray department  @  [facility] for your tests - we will call you with the results when they are available     Please schedule a follow up office visit in 6 weeks, call sooner if needed with all medications /inhalers/ solutions in hand    06/08/2023  f/u ov/ office/Nancy Liu re: *** maint on *** did *** bring meds  No chief complaint  on file.   Dyspnea:  *** Cough: *** Sleeping: ***   resp cc  SABA use: *** 02: ***  Lung cancer screening: ***   No obvious day to day or daytime variability or assoc excess/ purulent sputum or mucus plugs or hemoptysis or cp or chest tightness, subjective wheeze or overt sinus or hb symptoms.    Also denies any obvious fluctuation of symptoms with weather or environmental changes or other aggravating or alleviating factors except as outlined above   No unusual exposure hx or h/o childhood pna/ asthma or knowledge of premature birth.  Current Allergies, Complete Past Medical History, Past Surgical History, Family History, and Social History were reviewed in [employer] record.  ROS  The following are not active complaints unless bolded Hoarseness, sore throat, dysphagia, dental problems, itching, sneezing,  nasal congestion or discharge of excess mucus or purulent secretions, ear ache,   fever, chills, sweats, unintended wt loss or wt gain, classically pleuritic or exertional cp,  orthopnea pnd or arm/hand swelling  or leg swelling, presyncope, palpitations, abdominal pain, anorexia, nausea, vomiting, diarrhea  or change in bowel habits or change in bladder habits, change in stools or change in urine, dysuria, hematuria,  rash, arthralgias, visual complaints, headache, numbness, weakness or ataxia or problems with walking or coordination,  change in mood or  memory.        No outpatient medications have been marked as taking for the 06/08/23 encounter (Appointment) with Nancy Ozell NOVAK, MD.                  Past Medical History:  Diagnosis Date   Arteriosclerotic cardiovascular disease (ASCVD) 2001   Bare-metal stent placed in the right coronary artery in 12/01; residual 50% lesion of the first diagonal and mid LAD   Breast cancer [facility])    Cerebrovascular disease    COPD (chronic obstructive pulmonary disease) (HCC)    Cyst, dermoid, arm, left 03/26/2018    Diabetes mellitus    excellent control with a low-dose of a single oral agent   Family history of breast cancer 01/23/2021   Family history of ovarian cancer 01/23/2021   GERD (gastroesophageal reflux disease)    with ulcers   History of right coronary artery stent placement 2000  Hyperlipidemia    Hypertension    Sleep apnea    Thalassemia minor    Tobacco abuse, in remission    35 pack years; Quit in 1980       Objective:    Wts  06/08/2023          ***  03/09/2023       145  02/09/2023         151  08/25/2022       144   07/17/22 142 lb 6.4 oz (64.6 kg)  07/16/22 144 lb (65.3 kg)  07/03/22 146 lb 6.4 oz (66.4 kg)     Vital signs reviewed  06/08/2023  - Note at rest 02 sats  ***% on ***   General appearance:    ***       Assessment

## 2023-06-08 ENCOUNTER — Ambulatory Visit: Payer: PPO | Admitting: Internal Medicine

## 2023-06-09 ENCOUNTER — Ambulatory Visit: Payer: PPO | Admitting: Internal Medicine

## 2023-06-09 ENCOUNTER — Encounter: Payer: Self-pay | Admitting: Internal Medicine

## 2023-06-09 VITALS — BP 163/85 | HR 81 | Ht 62.5 in | Wt 147.0 lb

## 2023-06-09 DIAGNOSIS — R053 Chronic cough: Secondary | ICD-10-CM | POA: Diagnosis not present

## 2023-06-09 MED ORDER — METHYLPREDNISOLONE ACETATE 80 MG/ML IJ SUSP
120.0000 mg | Freq: Once | INTRAMUSCULAR | Status: AC
Start: 2023-06-09 — End: 2023-06-09
  Administered 2023-06-09: 120 mg via INTRAMUSCULAR

## 2023-06-09 NOTE — Patient Instructions (Addendum)
 Prevacid  30 mg (Lansoprazole )  Take 30- 60 min before your first and last meals of the day   GERD (REFLUX)  is an extremely common cause of respiratory symptoms just like yours , many times with no obvious heartburn at all.    It can be treated with medication, but also with lifestyle changes including elevation of the head of your bed (ideally with 6 -8inch blocks under the headboard of your bed),  Smoking cessation, avoidance of late meals, excessive alcohol, and avoid fatty foods, chocolate, peppermint, colas, red wine, and acidic juices such as orange juice.  NO MINT OR MENTHOL PRODUCTS SO NO COUGH DROPS  USE SUGARLESS CANDY INSTEAD (Jolley ranchers or Stover's or Life Savers) or even ice chips will also do - the key is to swallow to prevent all throat clearing. NO OIL BASED VITAMINS - use powdered substitutes.  Avoid fish oil when coughing.    Take delsym  (over the counter)  two tsp every 12 hours and supplement if needed with tessalon  200 mg up to three times daily  to suppress the urge to cough. Swallowing water  and/or using ice chips/non mint and menthol containing candies (such as lifesavers or sugarless jolly ranchers) are also effective.  You should rest your voice and avoid activities that you know make you cough.  Once you have eliminated the cough for 3 straight days try reducing the tessalon ,  then the delsym  as tolerated.    For  throat tickle and evening cough  try take CHLORPHENIRAMINE  4 mg  (Allergy Relief 4mg   at [facility] should be easiest to find in the blue box usually on bottom shelf)  take one every 4 hours as needed - extremely effective and inexpensive over the counter- may cause drowsiness so start with just a dose or two an hour before bedtime and see how you tolerate it before trying in daytime.   Depomedrol 120 mg IM   See your PCP about your tremor    Please schedule a follow up office visit in 6 weeks, call sooner if needed with all medications  /inhalers/ solutions in hand so we can verify exactly what you are taking. This includes all medications from all doctors and over the counters

## 2023-06-09 NOTE — Progress Notes (Signed)
 Nancy Liu, female    DOB: July 17, 1940    MRN: [id number]   Brief patient profile:  18 yobf quit smoking 1989   self referred to pulmonary clinic in [place]  07/03/2022  for refractory cough flared summer 2023 but onset per record was really 2008   Has seen Dr Shellia with working dx of bronchiectasis by HRCT  10/20/19    History of Present Illness  07/03/2022  Pulmonary/ 1st office eval/ Durrell Barajas / [place] [facility]  Chief Complaint  Patient presents with   Acute Visit    Ongoing bad cough  Patient states no improvement and lately the cough causes her nose to run. Neg covid test   Dyspnea:  also onset with cough/ esp steps now much more difficult  Cough: urge to clear throat during the day / worse immediately at hs/ assoc with watery pnds no better on H1/ assoc hoarseness and throat discomfort  Sleep: nightly  SABA use: don't help 02: none  Rec Continue prevacid  30 mg Take 30- 60 min before your first and last meals of the day  GERD diet reviewed, bed blocks rec  For drainage / throat tickle try take CHLORPHENIRAMINE  4 mg   Depomerdrol 120 mg IM today  For cough > mucinex  dm 1200 mg every 12 hours as needed  Return to this clinic if not better with all medications with you in 2 weeks     03/09/2023  f/u ov/[place] office/Alianis Trimmer re: cough x 2008 flare x 1 weeks dry maint on prevacid  30mg   bid  / no longer using 1st gen H1 blockers per guidelines   Chief Complaint  Patient presents with   Cough    X1week, denies fevers/chills/shob  Dyspnea:  about 10 min fast pace flat surface  Cough: after supper tends to flare but quiets down, then wakes her up 6 am hard coughing > min mucoid assoc with midline cp during severe coughing fits  Sleeping: bed is flat/ sev pillows  s resp cc  SABA use: none  02: none Rec Prevacid  30 mg Take 30- 60 min before your first and last meals of the day  GERD diet reviewed, bed blocks rec  Take delsym  (over the counter)  two tsp every 12 hours  and supplement if needed with tessalon  200 mg up to three times daily  t Once you have eliminated the cough for 3 straight days try reducing the tessalon ,  then the delsym  as tolerated.   For  throat tickle and evening cough  try take CHLORPHENIRAMINE  4 mg   Depomedrol 120 mg IM  Cxr ok       06/09/2023  f/u ov/[place] office/Dilpreet Faires re: cough x 2008  maint on lansoprazole  30 mg daily  did  bring meds but not consistent with  recs above  Chief Complaint  Patient presents with   Cough  Dyspnea:  10 min fast pace  Cough: cough worse p supper (not taking ppi ac)  Sleeping: better with wedge pillows    SABA use: none  02: none    No obvious day to day or daytime variability or assoc excess/ purulent sputum or mucus plugs or hemoptysis or cp or chest tightness, subjective wheeze or overt sinus or hb symptoms.    Also denies any obvious fluctuation of symptoms with weather or environmental changes or other aggravating or alleviating factors except as outlined above   No unusual exposure hx or h/o childhood pna/ asthma or knowledge of premature birth.  Current Allergies, Complete Past Medical History, Past Surgical History, Family History, and Social History were reviewed in [employer] record.  ROS  The following are not active complaints unless bolded Hoarseness, sore throat, dysphagia, dental problems, itching, sneezing,  nasal congestion or discharge of excess mucus or purulent secretions, ear ache,   fever, chills, sweats, unintended wt loss or wt gain, classically pleuritic or exertional cp,  orthopnea pnd or arm/hand swelling  or leg swelling, presyncope, palpitations, abdominal pain, anorexia, nausea, vomiting, diarrhea  or change in bowel habits or change in bladder habits, change in stools or change in urine, dysuria, hematuria,  rash, arthralgias, visual complaints, headache, numbness, weakness or ataxia or problems with walking or coordination,  change in mood  or  memory. Tremor  x 6 months         Current Meds  Medication Sig   acetaminophen  (TYLENOL ) 500 MG tablet Take 1,000 mg by mouth every 6 (six) hours as needed for moderate pain or headache.   amLODipine  (NORVASC ) 10 MG tablet TAKE ONE TABLET BY MOUTH EVERYDAY AT BEDTIME   aspirin  EC 81 MG tablet Take 81 mg by mouth daily. Swallow whole.   benzonatate  (TESSALON ) 200 MG capsule Take 1 capsule (200 mg total) by mouth 3 (three) times daily as needed for cough.   Blood Glucose Monitoring Suppl (ONE TOUCH ULTRA 2) w/Device KIT SMARTSIG:Via Meter   Cholecalciferol (VITAMIN D3) 50 MCG (2000 UT) TABS TAKE ONE TABLET BY MOUTH daily in addition TO THE 1,000unit FOR total of 3,000units daily   Cholecalciferol 25 MCG (1000 UT) tablet TAKE ONE TABLET BY MOUTH daily along with THE 2,000unit FOR A total of 3,000units daily   citalopram (CELEXA) 10 MG tablet Take 10 mg by mouth daily.   ferrous sulfate  325 (65 FE) MG EC tablet Take 1 tablet (325 mg total) by mouth daily with breakfast.   irbesartan -hydrochlorothiazide  (AVALIDE) 300-12.5 MG tablet Take 1 tablet by mouth daily before breakfast.   lansoprazole  (PREVACID ) 30 MG capsule TAKE ONE CAPSULE BY MOUTH EVERY MORNING   metFORMIN  (GLUCOPHAGE ) 500 MG tablet Take 500 mg by mouth at bedtime.   mirtazapine  (REMERON ) 7.5 MG tablet Take 1 tablet (7.5 mg total) by mouth at bedtime. For depression and weight loss.   ONETOUCH ULTRA test strip    polyethylene glycol powder (GLYCOLAX /MIRALAX ) 17 GM/SCOOP powder Take 1/2 capful daily as needed. Make sure to take a dose if you go more than 48 hours without a bowel movement.   rosuvastatin  (CRESTOR ) 20 MG tablet TAKE ONE TABLET BY MOUTH EVERYDAY AT BEDTIME   spironolactone (ALDACTONE) 25 MG tablet Take 25 mg by mouth every morning.   triamcinolone ointment (KENALOG) 0.5 % Apply 1 application  topically 2 (two) times daily as needed (rash).   Turmeric 500 MG CAPS Take 500 mg by mouth daily.                  Past  Medical History:  Diagnosis Date   Arteriosclerotic cardiovascular disease (ASCVD) 2001   Bare-metal stent placed in the right coronary artery in 12/01; residual 50% lesion of the first diagonal and mid LAD   Breast cancer (HCC)    Cerebrovascular disease    COPD (chronic obstructive pulmonary disease) (HCC)    Cyst, dermoid, arm, left 03/26/2018   Diabetes mellitus    excellent control with a low-dose of a single oral agent   Family history of breast cancer 01/23/2021   Family history of ovarian cancer  01/23/2021   GERD (gastroesophageal reflux disease)    with ulcers   History of right coronary artery stent placement 2000   Hyperlipidemia    Hypertension    Sleep apnea    Thalassemia minor    Tobacco abuse, in remission    35 pack years; Quit in 1980       Objective:    Wts  06/09/2023          147   03/09/2023       145  02/09/2023         151  08/25/2022       144   07/17/22 142 lb 6.4 oz (64.6 kg)  07/16/22 144 lb (65.3 kg)  07/03/22 146 lb 6.4 oz (66.4 kg)     Vital signs reviewed  06/09/2023  - Note at rest 02 sats  97% on RA       HEENT : Oropharynx  clear         NECK :  without  apparent JVD/ palpable Nodes/TM    LUNGS: no acc muscle use,  Nl contour chest which is clear to A and P bilaterally with urge to  cough on insp or exp maneuvers   CV:  RRR  no s3 or murmur or increase in P2, and no edema   ABD:  soft and nontender   MS:  Gait nl   ext warm without deformities Or obvious joint restrictions  calf tenderness, cyanosis or clubbing    SKIN: warm and dry without lesions    NEURO:  alert, approp, nl sensorium with  no motor or cerebellar deficits apparent.       Assessment

## 2023-06-10 NOTE — Assessment & Plan Note (Signed)
 Onset 2008/worse since summer 2023 with bronchiectasis by   Chest  HRCT  10/20/19  - 07/03/2022 try max rx for gerd and pnds with 1st gen H1 blockers per guidelines   - Allergy screen 07/03/2022 >  Eos 0.3 /  IgE  29 - Cycilcal cough regimen 07/17/2022 >>> resolved as of 08/25/2022 > continue gerd rx  - MRI sinus May 3 /2024 No significant paranasal sinus disease.   - 03/09/2023 bed blocks and Delysm/ tesslon prn / depomedrol 120 mg and 1st gen H1 blockers per guidelines    Did not follow the recs above  Advised: The standardized cough guidelines published in Chest by Charlie Coder in 2006 are still the best available and consist of a multiple step process (up to 12!) , not a single office visit,  and are intended  to address this problem logically,  with an alogrithm dependent on response to empiric treatment at  each progressive step  to determine a specific diagnosis with  minimal addtional testing needed. Therefore if adherence is an issue or can't be accurately verified,  it's very unlikely the standard evaluation and treatment will be successful here.    Furthermore, response to therapy (other than acute cough suppression, which should only be used short term with avoidance of narcotic containing cough syrups if possible), can be a gradual process for which the patient is not likely to  perceive immediate benefit.  Unlike going to an eye doctor where the best perscription is almost always the first one and is immediately effective, this is almost never the case in the management of chronic cough syndromes. Therefore the patient needs to commit up front to consistently adhere to recommendations  for up to 6 weeks of therapy directed at the likely underlying problem(s) before the response can be reasonably evaluated.   Rec Prevacid  30 mg (Lansoprazole )  Take 30- 60 min before your first and last meals of the day   GERD diet/ lifestyle reviewed   Take delsym  (over the counter)  two tsp every 12 hours  and supplement if needed with tessalon  200 mg up to three times daily  to suppress the urge to cough. Swallowing water  and/or using ice chips/non mint and menthol containing candies (such as lifesavers or sugarless jolly ranchers) are also effective.  You should rest your voice and avoid activities that you know make you cough.  Once you have eliminated the cough for 3 straight days try reducing the tessalon ,  then the delsym  as tolerated.    For  throat tickle and evening cough  try take CHLORPHENIRAMINE  4 mg     Depomedrol 120 mg IM   See your PCP about your tremor    Return in 6 weeks with all meds in hand using a trust but verify approach to confirm accurate Medication  Reconciliation The principal here is that until we are certain that the  patients are doing what we've asked, it makes no sense to ask them to do more.          Each maintenance medication was reviewed in detail including emphasizing most importantly the difference between maintenance and prns and under what circumstances the prns are to be triggered using an action plan format where appropriate.  Total time for H and P, chart review, counseling,   and generating customized AVS unique to this office visit / same day charting > 30 min for multiple  refractory respiratory  symptoms of uncertain etiology

## 2023-06-19 DIAGNOSIS — E785 Hyperlipidemia, unspecified: Secondary | ICD-10-CM | POA: Diagnosis not present

## 2023-06-19 DIAGNOSIS — F064 Anxiety disorder due to known physiological condition: Secondary | ICD-10-CM | POA: Diagnosis not present

## 2023-06-19 DIAGNOSIS — I10 Essential (primary) hypertension: Secondary | ICD-10-CM | POA: Diagnosis not present

## 2023-06-19 DIAGNOSIS — E1169 Type 2 diabetes mellitus with other specified complication: Secondary | ICD-10-CM | POA: Diagnosis not present

## 2023-06-24 DIAGNOSIS — E1169 Type 2 diabetes mellitus with other specified complication: Secondary | ICD-10-CM | POA: Diagnosis not present

## 2023-06-24 DIAGNOSIS — E649 Sequelae of unspecified nutritional deficiency: Secondary | ICD-10-CM | POA: Diagnosis not present

## 2023-06-24 DIAGNOSIS — E782 Mixed hyperlipidemia: Secondary | ICD-10-CM | POA: Diagnosis not present

## 2023-06-24 DIAGNOSIS — R251 Tremor, unspecified: Secondary | ICD-10-CM | POA: Diagnosis not present

## 2023-06-24 DIAGNOSIS — R634 Abnormal weight loss: Secondary | ICD-10-CM | POA: Diagnosis not present

## 2023-06-24 DIAGNOSIS — K5900 Constipation, unspecified: Secondary | ICD-10-CM | POA: Diagnosis not present

## 2023-06-24 DIAGNOSIS — F064 Anxiety disorder due to known physiological condition: Secondary | ICD-10-CM | POA: Diagnosis not present

## 2023-06-28 ENCOUNTER — Emergency Department (HOSPITAL_COMMUNITY)
Admission: EM | Admit: 2023-06-28 | Discharge: 2023-06-28 | Disposition: A | Discharge status: Home / Self Care | Payer: PPO

## 2023-06-28 ENCOUNTER — Other Ambulatory Visit: Payer: Self-pay

## 2023-06-28 ENCOUNTER — Encounter (HOSPITAL_COMMUNITY): Payer: Self-pay

## 2023-06-28 ENCOUNTER — Emergency Department (HOSPITAL_COMMUNITY): Payer: PPO

## 2023-06-28 DIAGNOSIS — Z7982 Long term (current) use of aspirin: Secondary | ICD-10-CM | POA: Insufficient documentation

## 2023-06-28 DIAGNOSIS — R0789 Other chest pain: Secondary | ICD-10-CM | POA: Diagnosis not present

## 2023-06-28 DIAGNOSIS — Z20822 Contact with and (suspected) exposure to covid-19: Secondary | ICD-10-CM | POA: Insufficient documentation

## 2023-06-28 DIAGNOSIS — R0602 Shortness of breath: Secondary | ICD-10-CM | POA: Diagnosis not present

## 2023-06-28 DIAGNOSIS — R059 Cough, unspecified: Secondary | ICD-10-CM | POA: Diagnosis not present

## 2023-06-28 DIAGNOSIS — R079 Chest pain, unspecified: Secondary | ICD-10-CM | POA: Insufficient documentation

## 2023-06-28 DIAGNOSIS — R058 Other specified cough: Secondary | ICD-10-CM | POA: Diagnosis not present

## 2023-06-28 LAB — CBC
HCT: 44.9 % (ref 36.0–46.0)
Hemoglobin: 13.9 g/dL (ref 12.0–15.0)
MCH: 24.3 pg — ABNORMAL LOW (ref 26.0–34.0)
MCHC: 31 g/dL (ref 30.0–36.0)
MCV: 78.5 fL — ABNORMAL LOW (ref 80.0–100.0)
Platelets: 339 10*3/uL (ref 150–400)
RBC: 5.72 MIL/uL — ABNORMAL HIGH (ref 3.87–5.11)
RDW: 15.8 % — ABNORMAL HIGH (ref 11.5–15.5)
WBC: 10.6 10*3/uL — ABNORMAL HIGH (ref 4.0–10.5)
nRBC: 0 % (ref 0.0–0.2)

## 2023-06-28 LAB — BASIC METABOLIC PANEL
Anion gap: 10 (ref 5–15)
BUN: 18 mg/dL (ref 8–23)
CO2: 28 mmol/L (ref 22–32)
Calcium: 9.5 mg/dL (ref 8.9–10.3)
Chloride: 98 mmol/L (ref 98–111)
Creatinine, Ser: 1.21 mg/dL — ABNORMAL HIGH (ref 0.44–1.00)
GFR, Estimated: 45 mL/min — ABNORMAL LOW (ref >60)
Glucose, Bld: 96 mg/dL (ref 70–99)
Potassium: 3.7 mmol/L (ref 3.5–5.1)
Sodium: 136 mmol/L (ref 135–145)

## 2023-06-28 LAB — TROPONIN I (HIGH SENSITIVITY): Troponin I (High Sensitivity): 10 ng/L (ref <18)

## 2023-06-28 LAB — RESP PANEL BY RT-PCR (RSV, FLU A&B, COVID)  RVPGX2
Influenza A by PCR: NEGATIVE
Influenza B by PCR: NEGATIVE
Resp Syncytial Virus by PCR: NEGATIVE
SARS Coronavirus 2 by RT PCR: NEGATIVE

## 2023-06-28 NOTE — ED Provider Notes (Signed)
[place] EMERGENCY DEPARTMENT AT [facility] Provider Note   CSN: [id number] Arrival date & time: 06/28/23  1149     History  Chief Complaint  Patient presents with   Cough    Nancy Liu is a 83 y.o. female.  Is a 83 year old female presenting emergency department for chest pain.  Reports woke up with chest pain, some mild associated shortness of breath.  Symptoms of seemingly improved and not currently having chest pain.  No URI symptoms past several days.  Has appointment with cardiology in a few days.  Otherwise low risk for PE based on Wells criteria.   Cough      Home Medications Prior to Admission medications   Medication Sig Start Date End Date Taking? Authorizing Provider  acetaminophen (TYLENOL) 500 MG tablet Take 1,000 mg by mouth every 6 (six) hours as needed for moderate pain or headache.    [provider]  amLODipine (NORVASC) 10 MG tablet TAKE ONE TABLET BY MOUTH EVERYDAY AT BEDTIME 10/17/22   Antoine Poche, MD  aspirin EC 81 MG tablet Take 81 mg by mouth daily. Swallow whole.    [provider]  benzonatate (TESSALON) 200 MG capsule Take 1 capsule (200 mg total) by mouth 3 (three) times daily as needed for cough. 03/09/23   [name], MD  Blood Glucose Monitoring Suppl (ONE TOUCH ULTRA 2) w/Device KIT SMARTSIG:Via Meter 06/02/23   [provider]  carvedilol (COREG) 6.25 MG tablet TAKE ONE TABLET BY MOUTH BEFORE BREAKFAST and TAKE ONE TABLET BY MOUTH EVERYDAY AT BEDTIME Patient not taking: Reported on 06/09/2023 10/17/22   Antoine Poche, MD  Cholecalciferol (VITAMIN D3) 50 MCG (2000 UT) TABS TAKE ONE TABLET BY MOUTH daily in addition TO THE 1,000unit FOR total of 3,000units daily 05/02/22   Carnella Guadalajara, PA-C  Cholecalciferol 25 MCG (1000 UT) tablet TAKE ONE TABLET BY MOUTH daily along with THE 2,000unit FOR A total of 3,000units daily 05/02/22   Rojelio Brenner M, PA-C  citalopram (CELEXA) 10 MG  tablet Take 10 mg by mouth daily. 02/09/23   [provider]  ferrous sulfate 325 (65 FE) MG EC tablet Take 1 tablet (325 mg total) by mouth daily with breakfast. 02/17/23   Carnella Guadalajara, PA-C  irbesartan-hydrochlorothiazide (AVALIDE) 300-12.5 MG tablet Take 1 tablet by mouth daily before breakfast. 10/17/22   Antoine Poche, MD  lansoprazole (PREVACID) 30 MG capsule TAKE ONE CAPSULE BY MOUTH EVERY MORNING 10/24/22   Tiffany Kocher, PA-C  metFORMIN (GLUCOPHAGE) 500 MG tablet Take 500 mg by mouth at bedtime. 11/05/21   [provider]  mirtazapine (REMERON) 7.5 MG tablet Take 1 tablet (7.5 mg total) by mouth at bedtime. For depression and weight loss. 10/28/22   Doreatha Massed, MD  [facility] ULTRA test strip  06/02/23   [provider]  polyethylene glycol powder (GLYCOLAX/MIRALAX) 17 GM/SCOOP powder Take 1/2 capful daily as needed. Make sure to take a dose if you go more than 48 hours without a bowel movement. 11/01/21   Tiffany Kocher, PA-C  rosuvastatin (CRESTOR) 20 MG tablet TAKE ONE TABLET BY MOUTH EVERYDAY AT BEDTIME 10/17/22   Antoine Poche, MD  spironolactone (ALDACTONE) 25 MG tablet Take 25 mg by mouth every morning. 12/01/22   [provider]  triamcinolone ointment (KENALOG) 0.5 % Apply 1 application  topically 2 (two) times daily as needed (rash).    [provider]  Turmeric 500 MG CAPS Take  500 mg by mouth daily.    [provider]      Allergies    Protonix [pantoprazole sodium]    Review of Systems   Review of Systems  Respiratory:  Positive for cough.     Physical Exam Updated Vital Signs BP (!) 160/86   Pulse (!) 103   Temp 97.6 F (36.4 C) (Oral)   Resp (!) 26   Ht 5\' 2"  (1.575 m)   Wt 63 kg   SpO2 100%   BMI 25.42 kg/m  Physical Exam Vitals and nursing note reviewed.  Constitutional:      General: She is not in acute distress. HENT:     Head: Normocephalic.     Nose: Nose normal.      Mouth/Throat:     Mouth: Mucous membranes are moist.  Eyes:     Conjunctiva/sclera: Conjunctivae normal.  Cardiovascular:     Rate and Rhythm: Normal rate and regular rhythm.  Pulmonary:     Effort: Pulmonary effort is normal.     Breath sounds: Normal breath sounds.  Abdominal:     General: Abdomen is flat. There is no distension.     Tenderness: There is no abdominal tenderness. There is no guarding or rebound.  Musculoskeletal:     Right lower leg: No edema.     Left lower leg: No edema.  Skin:    General: Skin is warm and dry.     Capillary Refill: Capillary refill takes less than 2 seconds.  Neurological:     Mental Status: She is alert and oriented to person, place, and time.  Psychiatric:        Mood and Affect: Mood normal.        Behavior: Behavior normal.     ED Results / Procedures / Treatments   Labs (all labs ordered are listed, but only abnormal results are displayed) Labs Reviewed  CBC - Abnormal; Notable for the following components:      Result Value   WBC 10.6 (*)    RBC 5.72 (*)    MCV 78.5 (*)    MCH 24.3 (*)    RDW 15.8 (*)    All other components within normal limits  BASIC METABOLIC PANEL - Abnormal; Notable for the following components:   Creatinine, Ser 1.21 (*)    GFR, Estimated 45 (*)    All other components within normal limits  RESP PANEL BY RT-PCR (RSV, FLU A&B, COVID)  RVPGX2  TROPONIN I (HIGH SENSITIVITY)  TROPONIN I (HIGH SENSITIVITY)    EKG EKG Interpretation Date/Time:  Sunday June 28 2023 12:20:00 EST Ventricular Rate:  108 PR Interval:  115 QRS Duration:  122 QT Interval:  352 QTC Calculation: 472 R Axis:   97  Text Interpretation: Sinus tachycardia Probable left atrial enlargement RBBB and LPFB Confirmed by Estanislado Pandy [phone]) on 06/28/2023 2:09:21 PM  Radiology DG Chest 2 View Result Date: 06/28/2023 CLINICAL DATA:  Productive cough and chest pain for several days. EXAM: CHEST - 2 VIEW COMPARISON:  None Available.  FINDINGS: The heart size and mediastinal contours are within normal limits. Both lungs are clear. The visualized skeletal structures are unremarkable. IMPRESSION: No active cardiopulmonary disease. Electronically Signed   By: Danae Orleans M.D.   On: 06/28/2023 13:35    Procedures Procedures    Medications Ordered in ED Medications - No data to display  ED Course/ Medical Decision Making/ A&P Clinical Course as of 06/28/23 1431  Sun Jun 28, 2023  1338 DG Chest 2 View IMPRESSION: No active cardiopulmonary disease.   [TY]  1338 Resp panel by RT-PCR (RSV, Flu A&B, Covid) Anterior Nasal Swab negative [TY]  1338 CBC(!) Mild leukocytosis. [TY]    Clinical Course User Index [TY] Coral Spikes, DO                                 Medical Decision Making This is a 83 year old female presenting emergency department with cough and chest pain.  She simile has chronic cough per chart review in her report.  She is more concerned about her chest pain.  She is not currently having the chest pain.  EKG without ST segment changes to indicate ischemia.  Her troponin is negative.  ACS unlikely.  Low risk for PE based on Wells criteria.  Chest x-ray without pneumonia or pneumothorax.  Her lung sounds are clear.  She is saturating 100% on room air.  She has no significant leukocytosis that would suggest systemic infection.  Given constellation of symptoms concern for possible viral etiology.  Flu/COVID-negative.  Basic metabolic panel without significant derangements.  She has a mild elevation of creatinine, appears to be at baseline.  She has appointment with her cardiologist in a few weeks.  Per chart review had recent negative stress test.  Do not feel that she would benefit from inpatient admission at this time.  Discussed PCP follow-up.  Stable for discharge.  Amount and/or Complexity of Data Reviewed External Data Reviewed:     Details: Stress test 04/24/2023 per chart review: :  Patient exercised  for 5 minutes and 16 seconds, per Bruce protocol, achieving 7.00 METS. Normal exercise capacity. Elevated BP response. No angina during the test. Test was stopped due to dyspnea. Stress ECG is negative for ischemia and arrhythmias.   LV perfusion is normal. There is no evidence of ischemia. There is no evidence of infarction.   Left ventricular function is normal. Nuclear stress EF: 87%.   Findings are consistent with no ischemia and no infarction. The study is low risk. " Labs: ordered. Decision-making details documented in ED Course. Radiology: ordered. Decision-making details documented in ED Course.          Final Clinical Impression(s) / ED Diagnoses Final diagnoses:  None    Rx / DC Orders ED Discharge Orders     None         Coral Spikes, DO 06/28/23 1431

## 2023-06-28 NOTE — ED Triage Notes (Signed)
Pt reports productive cough with some chest discomfort over the past several days.

## 2023-06-28 NOTE — Discharge Instructions (Signed)
Please follow-up with your primary doctor and your cardiologist.  Return if you develop worsening chest pain, worsening shortness of breath, feel lightheaded, passout or you develop any new worsening symptoms that are concerning to you.

## 2023-06-30 ENCOUNTER — Emergency Department (HOSPITAL_COMMUNITY): Payer: PPO

## 2023-06-30 ENCOUNTER — Encounter (HOSPITAL_COMMUNITY): Payer: Self-pay | Admitting: *Deleted

## 2023-06-30 ENCOUNTER — Observation Stay (HOSPITAL_COMMUNITY)
Admission: EM | Admit: 2023-06-30 | Discharge: 2023-07-01 | Disposition: A | Discharge status: Home / Self Care | Payer: PPO | Attending: Internal Medicine | Admitting: Internal Medicine

## 2023-06-30 ENCOUNTER — Other Ambulatory Visit: Payer: Self-pay

## 2023-06-30 DIAGNOSIS — Z79899 Other long term (current) drug therapy: Secondary | ICD-10-CM | POA: Diagnosis not present

## 2023-06-30 DIAGNOSIS — E782 Mixed hyperlipidemia: Secondary | ICD-10-CM | POA: Diagnosis not present

## 2023-06-30 DIAGNOSIS — N1831 Chronic kidney disease, stage 3a: Secondary | ICD-10-CM | POA: Diagnosis not present

## 2023-06-30 DIAGNOSIS — Z87891 Personal history of nicotine dependence: Secondary | ICD-10-CM | POA: Insufficient documentation

## 2023-06-30 DIAGNOSIS — Z853 Personal history of malignant neoplasm of breast: Secondary | ICD-10-CM | POA: Diagnosis not present

## 2023-06-30 DIAGNOSIS — Q670 Congenital facial asymmetry: Secondary | ICD-10-CM | POA: Diagnosis not present

## 2023-06-30 DIAGNOSIS — Z7982 Long term (current) use of aspirin: Secondary | ICD-10-CM | POA: Diagnosis not present

## 2023-06-30 DIAGNOSIS — Z7984 Long term (current) use of oral hypoglycemic drugs: Secondary | ICD-10-CM | POA: Insufficient documentation

## 2023-06-30 DIAGNOSIS — E0789 Other specified disorders of thyroid: Secondary | ICD-10-CM | POA: Diagnosis not present

## 2023-06-30 DIAGNOSIS — I10 Essential (primary) hypertension: Secondary | ICD-10-CM | POA: Diagnosis not present

## 2023-06-30 DIAGNOSIS — R29818 Other symptoms and signs involving the nervous system: Secondary | ICD-10-CM | POA: Diagnosis not present

## 2023-06-30 DIAGNOSIS — I129 Hypertensive chronic kidney disease with stage 1 through stage 4 chronic kidney disease, or unspecified chronic kidney disease: Secondary | ICD-10-CM | POA: Insufficient documentation

## 2023-06-30 DIAGNOSIS — R29810 Facial weakness: Secondary | ICD-10-CM | POA: Diagnosis present

## 2023-06-30 DIAGNOSIS — E1122 Type 2 diabetes mellitus with diabetic chronic kidney disease: Secondary | ICD-10-CM | POA: Diagnosis not present

## 2023-06-30 DIAGNOSIS — E119 Type 2 diabetes mellitus without complications: Secondary | ICD-10-CM

## 2023-06-30 DIAGNOSIS — G9340 Encephalopathy, unspecified: Secondary | ICD-10-CM

## 2023-06-30 DIAGNOSIS — G459 Transient cerebral ischemic attack, unspecified: Secondary | ICD-10-CM | POA: Diagnosis not present

## 2023-06-30 DIAGNOSIS — G9341 Metabolic encephalopathy: Secondary | ICD-10-CM | POA: Diagnosis not present

## 2023-06-30 DIAGNOSIS — I2510 Atherosclerotic heart disease of native coronary artery without angina pectoris: Secondary | ICD-10-CM | POA: Diagnosis not present

## 2023-06-30 DIAGNOSIS — J449 Chronic obstructive pulmonary disease, unspecified: Secondary | ICD-10-CM | POA: Insufficient documentation

## 2023-06-30 DIAGNOSIS — K219 Gastro-esophageal reflux disease without esophagitis: Secondary | ICD-10-CM | POA: Diagnosis not present

## 2023-06-30 LAB — I-STAT CHEM 8, ED
BUN: 34 mg/dL — ABNORMAL HIGH (ref 8–23)
Calcium, Ion: 1.16 mmol/L (ref 1.15–1.40)
Chloride: 97 mmol/L — ABNORMAL LOW (ref 98–111)
Creatinine, Ser: 1.3 mg/dL — ABNORMAL HIGH (ref 0.44–1.00)
Glucose, Bld: 102 mg/dL — ABNORMAL HIGH (ref 70–99)
HCT: 44 % (ref 36.0–46.0)
Hemoglobin: 15 g/dL (ref 12.0–15.0)
Potassium: 3.9 mmol/L (ref 3.5–5.1)
Sodium: 136 mmol/L (ref 135–145)
TCO2: 28 mmol/L (ref 22–32)

## 2023-06-30 LAB — COMPREHENSIVE METABOLIC PANEL
ALT: 17 U/L (ref 0–44)
AST: 25 U/L (ref 15–41)
Albumin: 4.1 g/dL (ref 3.5–5.0)
Alkaline Phosphatase: 52 U/L (ref 38–126)
Anion gap: 11 (ref 5–15)
BUN: 36 mg/dL — ABNORMAL HIGH (ref 8–23)
CO2: 28 mmol/L (ref 22–32)
Calcium: 9.7 mg/dL (ref 8.9–10.3)
Chloride: 96 mmol/L — ABNORMAL LOW (ref 98–111)
Creatinine, Ser: 1.17 mg/dL — ABNORMAL HIGH (ref 0.44–1.00)
GFR, Estimated: 47 mL/min — ABNORMAL LOW (ref >60)
Glucose, Bld: 107 mg/dL — ABNORMAL HIGH (ref 70–99)
Potassium: 4 mmol/L (ref 3.5–5.1)
Sodium: 135 mmol/L (ref 135–145)
Total Bilirubin: 0.7 mg/dL (ref 0.0–1.2)
Total Protein: 6.9 g/dL (ref 6.5–8.1)

## 2023-06-30 LAB — CBC
HCT: 41.1 % (ref 36.0–46.0)
Hemoglobin: 13.2 g/dL (ref 12.0–15.0)
MCH: 25 pg — ABNORMAL LOW (ref 26.0–34.0)
MCHC: 32.1 g/dL (ref 30.0–36.0)
MCV: 77.8 fL — ABNORMAL LOW (ref 80.0–100.0)
Platelets: 312 10*3/uL (ref 150–400)
RBC: 5.28 MIL/uL — ABNORMAL HIGH (ref 3.87–5.11)
RDW: 15.8 % — ABNORMAL HIGH (ref 11.5–15.5)
WBC: 9.3 10*3/uL (ref 4.0–10.5)
nRBC: 0 % (ref 0.0–0.2)

## 2023-06-30 LAB — DIFFERENTIAL
Abs Immature Granulocytes: 0.03 10*3/uL (ref 0.00–0.07)
Basophils Absolute: 0 10*3/uL (ref 0.0–0.1)
Basophils Relative: 0 %
Eosinophils Absolute: 0.1 10*3/uL (ref 0.0–0.5)
Eosinophils Relative: 1 %
Immature Granulocytes: 0 %
Lymphocytes Relative: 18 %
Lymphs Abs: 1.7 10*3/uL (ref 0.7–4.0)
Monocytes Absolute: 0.6 10*3/uL (ref 0.1–1.0)
Monocytes Relative: 7 %
Neutro Abs: 6.9 10*3/uL (ref 1.7–7.7)
Neutrophils Relative %: 74 %

## 2023-06-30 LAB — APTT: aPTT: 25 s (ref 24–36)

## 2023-06-30 LAB — TSH: TSH: 1.349 u[IU]/mL (ref 0.350–4.500)

## 2023-06-30 LAB — ETHANOL: Alcohol, Ethyl (B): <10 mg/dL (ref <10)

## 2023-06-30 LAB — AMMONIA: Ammonia: <10 umol/L (ref 9–35)

## 2023-06-30 LAB — PROTIME-INR
INR: 0.9 (ref 0.8–1.2)
Prothrombin Time: 11.9 s (ref 11.4–15.2)

## 2023-06-30 LAB — CBG MONITORING, ED: Glucose-Capillary: 104 mg/dL — ABNORMAL HIGH (ref 70–99)

## 2023-06-30 MED ORDER — LACTATED RINGERS IV BOLUS
500.0000 mL | Freq: Once | INTRAVENOUS | Status: AC
  Administered 2023-06-30: 500 mL via INTRAVENOUS

## 2023-06-30 MED ORDER — IOHEXOL 350 MG/ML SOLN
75.0000 mL | Freq: Once | INTRAVENOUS | Status: AC | PRN
  Administered 2023-06-30: 75 mL via INTRAVENOUS

## 2023-06-30 NOTE — Consult Note (Signed)
[facility] Telemedicine Consult   Requesting Provider: Artis Delay Consult Participants: Bed[place] nurse, telestroke nurse Location of the provider: [place], [place] Location of the patient: [facility]  This consult was provided via telemedicine with 2-way video and audio communication. The patient/family was informed that care would be provided in this way and agreed to receive care in this manner.    Chief Complaint: Confusion  HPI: 83 yo F who presents with about 2 weeks of confusion.  They have noticed that she says things like "where am I?"  and seems confused at times.  She was still in a relatively okay state around noon when she laid down for a nap but then shortly after awakening and going in for dinner, it was noticed that she was acting much more peculiarly than she typically does.  Family also suspected that she had some degree of facial asymmetry and at that point they decided to bring her into the hospital for further evaluation.  She was activated as a code stroke and taken emergently for CT.  On my evaluation, she had an NIH stroke scale of zero and especially with symptoms going on as long as they have, I did not feel that she was a candidate for either thrombectomy or TNK.  She first noticed problems around 6 pm.   LKW: two weeks ago, seen again at noon at her current baseline, then worse with some facial droop at 6pm tnk given?: No, out of window IR Thrombectomy? No, no LVO signs Time of teleneurologist evaluation: 1911  Exam: Vitals:   06/30/23 1900  BP: (!) 156/109  Pulse: 98  Resp: 17  Temp: 97.8 F (36.6 C)  SpO2: 100%    General: 0  1A: Level of Consciousness - 0 1B: Ask Month and Age - 0 1C: 'Blink Eyes' & 'Squeeze Hands' - 0 2: Test Horizontal Extraocular Movements - 0 3: Test Visual Fields - 0 4: Test Facial Palsy - 0 5A: Test Left Arm Motor Drift - 0 5B: Test Right Arm Motor Drift - 0 6A: Test Left Leg Motor Drift - 0 6B: Test Right Leg Motor  Drift - 0 7: Test Limb Ataxia - 0 8: Test Sensation - 0 9: Test Language/Aphasia- 0 10: Test Dysarthria - 0 11: Test Extinction/Inattention - 0 NIHSS score: 0   Imaging Reviewed: CT head-negative  Labs reviewed in epic and pertinent values follow: CBG 104   Assessment: 83 year old female with waxing/waning encephalopathy for the past 2 weeks with abrupt worsening on awakening from a nap this afternoon.  If she does have some acute physiological stressor causing mild delirium, it is not unusual to have a worsening following a nap.  Given the family's report of possible facial asymmetry, however, I do think further evaluation with MRI brain and vascular imaging would be prudent.  I also would favor getting an EEG in case she is having intermittent seizures as etiology for her intermittent confusion.  Recommendations:  MRI brain, either CTA or MRA of the head and neck I would only pursue stroke workup if the above is positive EEG CMP, CBC, TSH, ammonia, UDS, UA   Ritta Slot, MD [facility] [phone]  If 7pm- 7am, please page neurology on call as listed in AMION.

## 2023-06-30 NOTE — ED Triage Notes (Addendum)
Pt left facial droop, slurred speech and confusion per family member that  started 10 min ago. Family reports that pt was stating she was seeing "visions"

## 2023-06-30 NOTE — ED Provider Notes (Signed)
[place] EMERGENCY DEPARTMENT AT [facility] Provider Note   CSN: [id number] Arrival date & time: 06/30/23  1847     History {Add pertinent medical, surgical, social history, OB history to HPI:1} Chief Complaint  Patient presents with   Facial Droop    Nancy Liu is a 83 y.o. female.  HPI Patient presents for strokelike symptoms.  Medical history includes CAD, HLD, HTN, anemia, GERD, DM, breast cancer.  She is prescribed a 1 mg aspirin.  She has not prescribed a blood thinner.  She has no known history of stroke or seizure.  Patient seem to be in her normal state of health earlier today.  She was last seen well at 3 PM.  At that time, she laid down for a nap.  When she woke up, 15 minutes prior to arrival, family noticed facial droop, slurred speech, word finding difficulty, confusion, and gait instability.  She typically ambulates without difficulty.    Home Medications Prior to Admission medications   Medication Sig Start Date End Date Taking? Authorizing Provider  acetaminophen (TYLENOL) 500 MG tablet Take 1,000 mg by mouth every 6 (six) hours as needed for moderate pain or headache.    [provider]  amLODipine (NORVASC) 10 MG tablet TAKE ONE TABLET BY MOUTH EVERYDAY AT BEDTIME 10/17/22   Antoine Poche, MD  aspirin EC 81 MG tablet Take 81 mg by mouth daily. Swallow whole.    [provider]  benzonatate (TESSALON) 200 MG capsule Take 1 capsule (200 mg total) by mouth 3 (three) times daily as needed for cough. 03/09/23   [name], MD  Blood Glucose Monitoring Suppl (ONE TOUCH ULTRA 2) w/Device KIT SMARTSIG:Via Meter 06/02/23   [provider]  carvedilol (COREG) 6.25 MG tablet TAKE ONE TABLET BY MOUTH BEFORE BREAKFAST and TAKE ONE TABLET BY MOUTH EVERYDAY AT BEDTIME Patient not taking: Reported on 06/09/2023 10/17/22   Antoine Poche, MD  Cholecalciferol (VITAMIN D3) 50 MCG (2000 UT) TABS TAKE ONE TABLET BY MOUTH daily in  addition TO THE 1,000unit FOR total of 3,000units daily 05/02/22   Carnella Guadalajara, PA-C  Cholecalciferol 25 MCG (1000 UT) tablet TAKE ONE TABLET BY MOUTH daily along with THE 2,000unit FOR A total of 3,000units daily 05/02/22   Rojelio Brenner M, PA-C  citalopram (CELEXA) 10 MG tablet Take 10 mg by mouth daily. 02/09/23   [provider]  ferrous sulfate 325 (65 FE) MG EC tablet Take 1 tablet (325 mg total) by mouth daily with breakfast. 02/17/23   Carnella Guadalajara, PA-C  irbesartan-hydrochlorothiazide (AVALIDE) 300-12.5 MG tablet Take 1 tablet by mouth daily before breakfast. 10/17/22   Antoine Poche, MD  lansoprazole (PREVACID) 30 MG capsule TAKE ONE CAPSULE BY MOUTH EVERY MORNING 10/24/22   Tiffany Kocher, PA-C  metFORMIN (GLUCOPHAGE) 500 MG tablet Take 500 mg by mouth at bedtime. 11/05/21   [provider]  mirtazapine (REMERON) 7.5 MG tablet Take 1 tablet (7.5 mg total) by mouth at bedtime. For depression and weight loss. 10/28/22   Doreatha Massed, MD  [facility] ULTRA test strip  06/02/23   [provider]  polyethylene glycol powder (GLYCOLAX/MIRALAX) 17 GM/SCOOP powder Take 1/2 capful daily as needed. Make sure to take a dose if you go more than 48 hours without a bowel movement. 11/01/21   Tiffany Kocher, PA-C  rosuvastatin (CRESTOR) 20 MG tablet TAKE ONE TABLET BY MOUTH EVERYDAY AT BEDTIME 10/17/22   Antoine Poche,  MD  spironolactone (ALDACTONE) 25 MG tablet Take 25 mg by mouth every morning. 12/01/22   [provider]  triamcinolone ointment (KENALOG) 0.5 % Apply 1 application  topically 2 (two) times daily as needed (rash).    [provider]  Turmeric 500 MG CAPS Take 500 mg by mouth daily.    [provider]      Allergies    Protonix [pantoprazole sodium]    Review of Systems   Review of Systems  Unable to perform ROS: Mental status change  Neurological:  Positive for facial asymmetry, speech difficulty and  weakness.  Psychiatric/Behavioral:  Positive for confusion.     Physical Exam Updated Vital Signs BP (!) 156/109   Pulse 98   Temp 97.8 F (36.6 C) (Oral)   Resp 17   Ht 5\' 2"  (1.575 m)   Wt 63 kg   SpO2 100%   BMI 25.40 kg/m  Physical Exam Vitals and nursing note reviewed.  Constitutional:      General: She is not in acute distress.    Appearance: Normal appearance. She is well-developed. She is not toxic-appearing or diaphoretic.  HENT:     Head: Normocephalic and atraumatic.     Right Ear: External ear normal.     Left Ear: External ear normal.     Nose: Nose normal.     Mouth/Throat:     Mouth: Mucous membranes are moist.  Eyes:     Extraocular Movements: Extraocular movements intact.     Conjunctiva/sclera: Conjunctivae normal.  Cardiovascular:     Rate and Rhythm: Normal rate and regular rhythm.  Pulmonary:     Effort: Pulmonary effort is normal. No respiratory distress.  Abdominal:     General: There is no distension.     Palpations: Abdomen is soft.  Musculoskeletal:        General: No swelling.     Cervical back: Normal range of motion and neck supple.  Skin:    General: Skin is warm and dry.     Coloration: Skin is not jaundiced or pale.  Neurological:     Mental Status: She is alert.     Cranial Nerves: Facial asymmetry present.     Sensory: No sensory deficit.     Motor: Weakness (Global) present.     Comments: Word finding difficulty  Psychiatric:        Speech: Speech is delayed.        Behavior: Behavior is slowed and withdrawn.     ED Results / Procedures / Treatments   Labs (all labs ordered are listed, but only abnormal results are displayed) Labs Reviewed  PROTIME-INR  APTT  CBC  DIFFERENTIAL  COMPREHENSIVE METABOLIC PANEL  ETHANOL  RAPID URINE DRUG SCREEN, HOSP PERFORMED  URINALYSIS, ROUTINE W REFLEX MICROSCOPIC  I-STAT CHEM 8, ED    EKG None  Radiology No results found.  Procedures Procedures  {Document cardiac  monitor, telemetry assessment procedure when appropriate:1}  Medications Ordered in ED Medications - No data to display  ED Course/ Medical Decision Making/ A&P   {   Click here for ABCD2, HEART and other calculatorsREFRESH Note before signing :1}                              Medical Decision Making Amount and/or Complexity of Data Reviewed Labs: ordered. Radiology: ordered.   This patient presents to the ED for concern of ***, this involves an  extensive number of treatment options, and is a complaint that carries with it a high risk of complications and morbidity.  The differential diagnosis includes ***   Co morbidities that complicate the patient evaluation  ***   Additional history obtained:  Additional history obtained from *** External records from outside source obtained and reviewed including ***   Lab Tests:  I Ordered, and personally interpreted labs.  The pertinent results include:  ***   Imaging Studies ordered:  I ordered imaging studies including ***  I independently visualized and interpreted imaging which showed *** I agree with the radiologist interpretation   Cardiac Monitoring: / EKG:  The patient was maintained on a cardiac monitor.  I personally viewed and interpreted the cardiac monitored which showed an underlying rhythm of: ***   Consultations Obtained:  I requested consultation with the ***,  and discussed lab and imaging findings as well as pertinent plan - they recommend: ***   Problem List / ED Course / Critical interventions / Medication management  Patient presenting for strokelike symptoms.  This was identified by family approximately 15 minutes prior to arrival.  She was last seen well at approximately 3 PM, when she laid down for a nap.  On arrival, patient is awake and alert.  She has word finding difficulty.  History from patient herself is limited.  She does have appears to be a very slight facial droop.  She denies areas of  diminished sensation.  She appears to have global weakness with mild upper extremity intention tremor.  She has difficult time following directions, which limits exam.  Given timeline, code stroke was initiated.***. I ordered medication including ***  for ***  Reevaluation of the patient after these medicines showed that the patient {resolved/improved/worsened:23923::"improved"} I have reviewed the patients home medicines and have made adjustments as needed   Social Determinants of Health:  ***   Test / Admission - Considered:  ***   {Document critical care time when appropriate:1} {Document review of labs and clinical decision tools ie heart score, Chads2Vasc2 etc:1}  {Document your independent review of radiology images, and any outside records:1} {Document your discussion with family members, caretakers, and with consultants:1} {Document social determinants of health affecting pt's care:1} {Document your decision making why or why not admission, treatments were needed:1} Final Clinical Impression(s) / ED Diagnoses Final diagnoses:  None    Rx / DC Orders ED Discharge Orders     None

## 2023-06-30 NOTE — H&P (Signed)
History and Physical    Patient: Nancy Liu XBM:[id number] DOB: 1941-01-23 DOA: 06/30/2023 DOS: the patient was seen and examined on 07/01/2023 PCP: Renaye Rakers, MD  Patient coming from: Home  Chief Complaint:  Chief Complaint  Patient presents with   Facial Droop   HPI: Nancy Liu is a 83 y.o. female with medical history significant of hypertension, hyperlipidemia, T2DM, tremor (unspecified), CAD who presents to the emergency department due to concern for strokelike symptoms. Apparently, patient has been having issues with memory for about 1 year, this was in the last 2 weeks and today she went to bed around 3 PM, on waking up, daughter noted facial asymmetry and was concerned that patient may be having stroke, EMS was activated and patient was sent to the ED for further evaluation and management. Patient has also been having confusion and hallucinations for about 2 weeks.  She has had nonspecific tremors for about a year, but this has also worsened.  She also lost about 14 pounds within the last 2 months.  She denies chest pain, shortness of breath, nausea vomiting Neurologist (Dr. Amada Jupiter was consulted and recommended MRI brain, CT angiography or MRA of the head and neck, EEG, CMP, CBC, TSH, ammonia, UDS and UA.  Stroke workup to be pursued only if MRI or CT angiography is positive.  ED Course:  In the emergency department, BP was 152/109, other vitals are within normal range.  Workup in the ED showed normal CBC except for MCV of 77.8.  BMP was positive for chloride 96, blood glucose 107, BUN 36, creatinine 1.17.  Alcohol level is less than 10, ammonia was less than 10. MRI head without contrast showed no evidence of acute intracranial abnormality CT angiography head and neck with and without contrast showed no intracranial large vessel occlusion.  Mild stenosis in the proximal right M2 and at the P1-P2 junction CT head without contrast showed no evidence of acute  intracranial abnormality IV LR 500 mL x 1 was given.  Hospitalist was asked to admit patient for further evaluation and management.   Review of Systems: Review of systems as noted in the HPI. All other systems reviewed and are negative.   Past Medical History:  Diagnosis Date   Arteriosclerotic cardiovascular disease (ASCVD) 2001   Bare-metal stent placed in the right coronary artery in 12/01; residual 50% lesion of the first diagonal and mid LAD   Breast cancer (HCC)    Cerebrovascular disease    COPD (chronic obstructive pulmonary disease) (HCC)    Cyst, dermoid, arm, left 03/26/2018   Diabetes mellitus    excellent control with a low-dose of a single oral agent   Family history of breast cancer 01/23/2021   Family history of ovarian cancer 01/23/2021   GERD (gastroesophageal reflux disease)    with ulcers   History of right coronary artery stent placement 2000   Hyperlipidemia    Hypertension    Sleep apnea    Thalassemia minor    Tobacco abuse, in remission    35 pack years; Quit in 1980   Past Surgical History:  Procedure Laterality Date   BIOPSY  02/28/2020   Procedure: BIOPSY;  Surgeon: Lanelle Bal, DO;  Location: [facility];  Service: Endoscopy;;   COLONOSCOPY  2006   Dr. Loreta Ave: normal   COLONOSCOPY  2000   Dr. Tad Moore: normal    COLONOSCOPY N/A 03/03/2016   Dr. Darrick Penna: diverticulosis, non-bleeding hemorrhoids, redundant left colon.   DILATION AND CURETTAGE  OF UTERUS     ESOPHAGOGASTRODUODENOSCOPY  2000   Dr. Tad Moore: gastritis    ESOPHAGOGASTRODUODENOSCOPY  2006   Dr. Loreta Ave: small hiatal hernia, gastritis, negative H.pylori    ESOPHAGOGASTRODUODENOSCOPY N/A 03/03/2016   Procedure: ESOPHAGOGASTRODUODENOSCOPY (EGD);  Surgeon: West Bali, MD;  Location: [facility];  Service: Endoscopy;  Laterality: N/A;   ESOPHAGOGASTRODUODENOSCOPY N/A 06/03/2016   Dr. Darrick Penna: nonaggressive gastritis due to aspirin use. Previous ulcers had healed.    ESOPHAGOGASTRODUODENOSCOPY (EGD) WITH PROPOFOL N/A 02/28/2020   focal inflammation in the gastric antrum consistent with gastritis on biopsies. No H.pylori.    EXCISION OF KELOID Left 03/26/2018   Procedure: EXCISION OF LEFT ARM MASS;  Surgeon: Claud Kelp, MD;  Location: [facility];  Service: General;  Laterality: Left;   GIVENS CAPSULE STUDY N/A 10/10/2020   Procedure: GIVENS CAPSULE STUDY;  Surgeon: Lanelle Bal, DO;  Location: [facility];  Service: Endoscopy;  Laterality: N/A;  7:30am   INGUINAL HERNIA REPAIR  1970s   Right   TOTAL MASTECTOMY Right 02/20/2021   Procedure: RIGHT TOTAL MASTECTOMY;  Surgeon: Emelia Loron, MD;  Location: [facility];  Service: General;  Laterality: Right;   VAGINAL HYSTERECTOMY  1980   Unilateral oophorectomy    Social History:  reports that she quit smoking about 43 years ago. Her smoking use included cigarettes. She started smoking about 58 years ago. She has a 18 pack-year smoking history. She has never used smokeless tobacco. She reports that she does not drink alcohol and does not use drugs.   Allergies  Allergen Reactions   Protonix [Pantoprazole Sodium]     DRY LIPS    Family History  Problem Relation Age of Onset   Heart disease Mother        also hypertension and asthma   Lung cancer Mother        dx 2s   Ovarian cancer Mother        dx before 62   Diabetes Half-Sister    Breast cancer Half-Sister 69   Multiple myeloma Nephew 47       war-related exposures   Colon cancer Neg Hx    Colon polyps Neg Hx      Prior to Admission medications   Medication Sig Start Date End Date Taking? Authorizing Provider  acetaminophen (TYLENOL) 500 MG tablet Take 1,000 mg by mouth every 6 (six) hours as needed for moderate pain or headache.    [provider]  amLODipine (NORVASC) 10 MG tablet TAKE ONE TABLET BY MOUTH EVERYDAY AT BEDTIME 10/17/22   Antoine Poche, MD  aspirin EC 81 MG tablet Take 81 mg by mouth  daily. Swallow whole.    [provider]  benzonatate (TESSALON) 200 MG capsule Take 1 capsule (200 mg total) by mouth 3 (three) times daily as needed for cough. 03/09/23   [name], MD  Blood Glucose Monitoring Suppl (ONE TOUCH ULTRA 2) w/Device KIT SMARTSIG:Via Meter 06/02/23   [provider]  carvedilol (COREG) 6.25 MG tablet TAKE ONE TABLET BY MOUTH BEFORE BREAKFAST and TAKE ONE TABLET BY MOUTH EVERYDAY AT BEDTIME Patient not taking: Reported on 06/09/2023 10/17/22   Antoine Poche, MD  Cholecalciferol (VITAMIN D3) 50 MCG (2000 UT) TABS TAKE ONE TABLET BY MOUTH daily in addition TO THE 1,000unit FOR total of 3,000units daily 05/02/22   Rojelio Brenner M, PA-C  Cholecalciferol 25 MCG (1000 UT) tablet TAKE ONE TABLET BY MOUTH daily along with THE 2,000unit FOR  A total of 3,000units daily 05/02/22   Rojelio Brenner M, PA-C  citalopram (CELEXA) 10 MG tablet Take 10 mg by mouth daily. 02/09/23   [provider]  ferrous sulfate 325 (65 FE) MG EC tablet Take 1 tablet (325 mg total) by mouth daily with breakfast. 02/17/23   Carnella Guadalajara, PA-C  irbesartan-hydrochlorothiazide (AVALIDE) 300-12.5 MG tablet Take 1 tablet by mouth daily before breakfast. 10/17/22   Antoine Poche, MD  lansoprazole (PREVACID) 30 MG capsule TAKE ONE CAPSULE BY MOUTH EVERY MORNING 10/24/22   Tiffany Kocher, PA-C  metFORMIN (GLUCOPHAGE) 500 MG tablet Take 500 mg by mouth at bedtime. 11/05/21   [provider]  mirtazapine (REMERON) 7.5 MG tablet Take 1 tablet (7.5 mg total) by mouth at bedtime. For depression and weight loss. 10/28/22   Doreatha Massed, MD  [facility] ULTRA test strip  06/02/23   [provider]  polyethylene glycol powder (GLYCOLAX/MIRALAX) 17 GM/SCOOP powder Take 1/2 capful daily as needed. Make sure to take a dose if you go more than 48 hours without a bowel movement. 11/01/21   Tiffany Kocher, PA-C  rosuvastatin (CRESTOR) 20 MG tablet TAKE ONE  TABLET BY MOUTH EVERYDAY AT BEDTIME 10/17/22   Antoine Poche, MD  spironolactone (ALDACTONE) 25 MG tablet Take 25 mg by mouth every morning. 12/01/22   [provider]  triamcinolone ointment (KENALOG) 0.5 % Apply 1 application  topically 2 (two) times daily as needed (rash).    [provider]  Turmeric 500 MG CAPS Take 500 mg by mouth daily.    [provider]    Physical Exam: BP 138/72 (BP Location: Right Arm)   Pulse 75   Temp 98.6 F (37 C)   Resp 16   Ht 5\' 2"  (1.575 m)   Wt 63 kg   SpO2 100%   BMI 25.40 kg/m   General: 83 y.o. year-old female well developed well nourished in no acute distress.  Alert and oriented x3. HEENT: NCAT, EOMI Neck: Supple, trachea medial Cardiovascular: Regular rate and rhythm with no rubs or gallops.  No thyromegaly or JVD noted.  No lower extremity edema. 2/4 pulses in all 4 extremities. Respiratory: Clear to auscultation with no wheezes or rales. Good inspiratory effort. Abdomen: Soft, nontender nondistended with normal bowel sounds x4 quadrants. Muskuloskeletal: No cyanosis, clubbing or edema noted bilaterally Neuro: Noted tremors in hands.  CN II-XII intact, strength 5/5 x 4, sensation, reflexes intact Skin: No ulcerative lesions noted or rashes Psychiatry: Judgement and insight appear normal. Mood is appropriate for condition and setting          Labs on Admission:  Basic Metabolic Panel: Recent Labs  Lab 06/28/23 1320 06/30/23 1926 06/30/23 1933  NA 136 135 136  K 3.7 4.0 3.9  CL 98 96* 97*  CO2 28 28  --   GLUCOSE 96 107* 102*  BUN 18 36* 34*  CREATININE 1.21* 1.17* 1.30*  CALCIUM 9.5 9.7  --    Liver Function Tests: Recent Labs  Lab 06/30/23 1926  AST 25  ALT 17  ALKPHOS 52  BILITOT 0.7  PROT 6.9  ALBUMIN 4.1   No results for input(s): "LIPASE", "AMYLASE" in the last 168 hours. Recent Labs  Lab 06/30/23 1946  AMMONIA <10   CBC: Recent Labs  Lab 06/28/23 1320 06/30/23 1926  06/30/23 1933  WBC 10.6* 9.3  --   NEUTROABS  --  6.9  --   HGB 13.9 13.2 15.0  HCT 44.9 41.1 44.0  MCV 78.5* 77.8*  --   PLT 339 312  --    Cardiac Enzymes: No results for input(s): "CKTOTAL", "CKMB", "CKMBINDEX", "TROPONINI" in the last 168 hours.  BNP (last 3 results) Recent Labs    07/03/22 1411  BNP 22.1    ProBNP (last 3 results) No results for input(s): "PROBNP" in the last 8760 hours.  CBG: Recent Labs  Lab 06/30/23 1901  GLUCAP 104*    Radiological Exams on Admission: MR BRAIN WO CONTRAST Result Date: 06/30/2023 CLINICAL DATA:  Transient ischemic attack (TIA) EXAM: MRI HEAD WITHOUT CONTRAST TECHNIQUE: Multiplanar, multiecho pulse sequences of the brain and surrounding structures were obtained without intravenous contrast. COMPARISON:  CT head from today. FINDINGS: Brain: No acute infarction, hemorrhage, hydrocephalus, extra-axial collection or mass lesion. Vascular: Major arterial flow voids are maintained. Skull and upper cervical spine: Normal marrow signal. Sinuses/Orbits: Clear sinuses.  No acute orbital findings. IMPRESSION: No evidence of acute intracranial abnormality. Electronically Signed   By: Feliberto Harts M.D.   On: 06/30/2023 21:09   CT ANGIO HEAD NECK W WO CM Result Date: 06/30/2023 CLINICAL DATA:  Transient ischemic attack EXAM: CT ANGIOGRAPHY HEAD AND NECK WITH AND WITHOUT CONTRAST TECHNIQUE: Multidetector CT imaging of the head and neck was performed using the standard protocol during bolus administration of intravenous contrast. Multiplanar CT image reconstructions and MIPs were obtained to evaluate the vascular anatomy. Carotid stenosis measurements (when applicable) are obtained utilizing NASCET criteria, using the distal internal carotid diameter as the denominator. RADIATION DOSE REDUCTION: This exam was performed according to the departmental dose-optimization program which includes automated exposure control, adjustment of the mA and/or kV  according to patient size and/or use of iterative reconstruction technique. CONTRAST:  75mL OMNIPAQUE IOHEXOL 350 MG/ML SOLN COMPARISON:  06/30/2023 CT head, no prior CTA available FINDINGS: CT HEAD FINDINGS For noncontrast findings, please see same day CT head. CTA NECK FINDINGS Aortic arch: Standard branching. Imaged portion shows no evidence of aneurysm or dissection. No significant stenosis of the major arch vessel origins. Right carotid system: No evidence of dissection, occlusion, or hemodynamically significant stenosis (greater than 50%). Left carotid system: No evidence of dissection, occlusion, or hemodynamically significant stenosis (greater than 50%). Vertebral arteries: The right vertebral artery is not visualized from its origin through the distal V2 segment, where it likely reconstitutes via collaterals. Diminutive right V3 segment. The left vertebral artery is patent from its origin to the skull base without significant stenosis. No evidence of dissection. Skeleton: No acute osseous abnormality. Degenerative changes in the cervical spine. Other neck: 1.7 cm heterogeneously enhancing mass in the posterior right thyroid lobe. With calcifications but Upper chest: No focal pulmonary opacity or pleural effusion. Review of the MIP images confirms the above findings CTA HEAD FINDINGS Anterior circulation: Both internal carotid arteries are patent to the termini, without significant stenosis. Patent right A1. Aplastic left A1. Normal anterior communicating artery. Anterior cerebral arteries are patent to their distal aspects without significant stenosis. No M1 stenosis or occlusion. Mild stenosis in the proximal right M2 (series 6, image 102 and series 8, image 73). MCA branches perfused to their distal aspects without significant stenosis. Posterior circulation: Vertebral arteries patent to the vertebrobasilar junction without significant stenosis, although the right vertebral artery remains diminutive and  less well opacified. Posterior inferior cerebellar arteries patent proximally. Basilar patent to its distal aspect without significant stenosis. Superior cerebellar arteries patent proximally. Hypoplastic right P1. Aplastic left P1. Fetal origin of the left  PCA and near fetal origin of the right PCA from the posterior communicating arteries. Mild stenosis at the P1-P2 junction (series 6, image 102). Evaluation of the more distal PCAs is somewhat limited by venous contamination. Venous sinuses: As permitted by contrast timing, patent. Anatomic variants: Fetal origin of left PCA. Near fetal origin of the right PCA. No evidence of aneurysm or vascular malformation. Review of the MIP images confirms the above findings IMPRESSION: 1. No intracranial large vessel occlusion. Mild stenosis in the proximal right M2 and at the P1-P2 junction. 2. The right vertebral artery is not visualized from its origin through the distal V2 segment, where it likely reconstitutes via collaterals. Diminutive right V3 segment. 3. No additional hemodynamically significant stenosis in the neck. 4. 1.7 cm heterogeneously enhancing mass in the posterior right thyroid lobe. If this has not previously been evaluated, consider a non-emergent ultrasound of the thyroid, taking into account the patient's age and comorbidities. (Reference: J Am Coll Radiol. 2015 Feb;12(2): 143-50) Electronically Signed   By: Wiliam Ke M.D.   On: 06/30/2023 20:33   CT HEAD CODE STROKE WO CONTRAST Result Date: 06/30/2023 CLINICAL DATA:  Code stroke.  Neuro deficit, acute, stroke suspected EXAM: CT HEAD WITHOUT CONTRAST TECHNIQUE: Contiguous axial images were obtained from the base of the skull through the vertex without intravenous contrast. RADIATION DOSE REDUCTION: This exam was performed according to the departmental dose-optimization program which includes automated exposure control, adjustment of the mA and/or kV according to patient size and/or use of  iterative reconstruction technique. COMPARISON:  MRI head May 3, 24. FINDINGS: Brain: No evidence of acute large vascular territory infarction, hemorrhage, hydrocephalus, extra-axial collection or mass lesion/mass effect. Vascular: No hyperdense vessel. Skull: No acute fracture. Sinuses/Orbits: Clear sinuses.  No acute orbital findings. Other: No mastoid effusions. ASPECTS [facility] Stroke Program Early CT Score) Total score (0-10 with 10 being normal): 10. IMPRESSION: 1. No evidence of acute intracranial abnormality. 2. ASPECTS is 10. Code stroke imaging results were communicated on 06/30/2023 at 7:17 pm to provider Dr. Durwin Nora via telephone, who verbally acknowledged these results. Electronically Signed   By: Feliberto Harts M.D.   On: 06/30/2023 19:17    EKG: I independently viewed the EKG done and my findings are as followed: Normal sinus rhythm at rate of 92 bpm with RBBB  Assessment/Plan Present on Admission:  Acute metabolic encephalopathy  Essential hypertension  Mixed hyperlipidemia  GERD (gastroesophageal reflux disease)  Principal Problem:   Acute metabolic encephalopathy Active Problems:   Mixed hyperlipidemia   Essential hypertension   Non-insulin dependent type 2 diabetes mellitus (HCC)   GERD (gastroesophageal reflux disease)   Facial asymmetry   Chronic kidney disease, stage 3a (HCC)  Acute metabolic encephalopathy CMP, CBC, TSH, ammonia levels were normal UDS and UA pending Vitamin B12 level will be checked Continue fall precaution  Facial asymmetry, rule out acute ischemic stroke MRI head without contrast showed no evidence of acute intracranial abnormality CT angiography head and neck with and without contrast showed no intracranial large vessel occlusion EEG will be done in the morning to rule out seizures Consider neurology consult based on EEG findings  Essential hypertension Continue amlodipine  Mixed hyperlipidemia Continue Crestor  GERD Continue  Protonix  T2DM Continue ISS and hypoglycemia protocol  CKD 3A Creatinine 1.17 (creatinine is within baseline range) Renally adjust medications, avoid nephrotoxic agents/dehydration/hypotension    DVT prophylaxis: Lovenox  Code Status: Full code  Family Communication: 2 daughters at bedside (all questions answered to satisfaction)  Consults: None  Severity of Illness: The appropriate patient status for this patient is OBSERVATION. Observation status is judged to be reasonable and necessary in order to provide the required intensity of service to ensure the patient's safety. The patient's presenting symptoms, physical exam findings, and initial radiographic and laboratory data in the context of their medical condition is felt to place them at decreased risk for further clinical deterioration. Furthermore, it is anticipated that the patient will be medically stable for discharge from the hospital within 2 midnights of admission.   Author: Frankey Shown, DO 07/01/2023 2:00 AM  For on call review [email or web address].

## 2023-06-30 NOTE — Progress Notes (Addendum)
1901: Code stroke cart activated at this time. Pt in Ct at time of activation. MRS: 0  1906: TSP paged at this time.   1911: TSP on cart at this time.   1922: Pt back from CT. TSP off cart at this time.

## 2023-06-30 NOTE — H&P (Incomplete)
History and Physical    Patient: Nancy Liu ZOX:[id number] DOB: 27-Jun-1940 DOA: 06/30/2023 DOS: the patient was seen and examined on 06/30/2023 PCP: Renaye Rakers, MD  Patient coming from: Home  Chief Complaint:  Chief Complaint  Patient presents with  . Facial Droop   HPI: Nancy Liu is a 83 y.o. female with medical history significant of hypertension, hyperlipidemia, T2DM, tremor (unspecified), CAD who presents to the emergency department due to concern for strokelike symptoms. Apparently, patient has been having issues with memory for about 1 year, this was in the last 2 weeks and today she went to bed around 3 PM, on waking up daughter noted facial droop and was concerned      Review of Systems: {ROS_Text:26778} Past Medical History:  Diagnosis Date  . Arteriosclerotic cardiovascular disease (ASCVD) 2001   Bare-metal stent placed in the right coronary artery in 12/01; residual 50% lesion of the first diagonal and mid LAD  . Breast cancer (HCC)   . Cerebrovascular disease   . COPD (chronic obstructive pulmonary disease) (HCC)   . Cyst, dermoid, arm, left 03/26/2018  . Diabetes mellitus    excellent control with a low-dose of a single oral agent  . Family history of breast cancer 01/23/2021  . Family history of ovarian cancer 01/23/2021  . GERD (gastroesophageal reflux disease)    with ulcers  . History of right coronary artery stent placement 2000  . Hyperlipidemia   . Hypertension   . Sleep apnea   . Thalassemia minor   . Tobacco abuse, in remission    35 pack years; Quit in 1980   Past Surgical History:  Procedure Laterality Date  . BIOPSY  02/28/2020   Procedure: BIOPSY;  Surgeon: Lanelle Bal, DO;  Location: [facility];  Service: Endoscopy;;  . COLONOSCOPY  2006   Dr. Loreta Ave: normal  . COLONOSCOPY  2000   Dr. Tad Moore: normal   . COLONOSCOPY N/A 03/03/2016   Dr. Darrick Penna: diverticulosis, non-bleeding hemorrhoids, redundant left colon.  Marland Kitchen DILATION  AND CURETTAGE OF UTERUS    . ESOPHAGOGASTRODUODENOSCOPY  2000   Dr. Tad Moore: gastritis   . ESOPHAGOGASTRODUODENOSCOPY  2006   Dr. Loreta Ave: small hiatal hernia, gastritis, negative H.pylori   . ESOPHAGOGASTRODUODENOSCOPY N/A 03/03/2016   Procedure: ESOPHAGOGASTRODUODENOSCOPY (EGD);  Surgeon: West Bali, MD;  Location: [facility];  Service: Endoscopy;  Laterality: N/A;  . ESOPHAGOGASTRODUODENOSCOPY N/A 06/03/2016   Dr. Darrick Penna: nonaggressive gastritis due to aspirin use. Previous ulcers had healed.  . ESOPHAGOGASTRODUODENOSCOPY (EGD) WITH PROPOFOL N/A 02/28/2020   focal inflammation in the gastric antrum consistent with gastritis on biopsies. No H.pylori.   Marland Kitchen EXCISION OF KELOID Left 03/26/2018   Procedure: EXCISION OF LEFT ARM MASS;  Surgeon: Claud Kelp, MD;  Location: [facility];  Service: General;  Laterality: Left;  . GIVENS CAPSULE STUDY N/A 10/10/2020   Procedure: GIVENS CAPSULE STUDY;  Surgeon: Lanelle Bal, DO;  Location: [facility];  Service: Endoscopy;  Laterality: N/A;  7:30am  . INGUINAL HERNIA REPAIR  1970s   Right  . TOTAL MASTECTOMY Right 02/20/2021   Procedure: RIGHT TOTAL MASTECTOMY;  Surgeon: Emelia Loron, MD;  Location: [facility] OR;  Service: General;  Laterality: Right;  Marland Kitchen VAGINAL HYSTERECTOMY  1980   Unilateral oophorectomy   Social History:  reports that she quit smoking about 43 years ago. Her smoking use included cigarettes. She started smoking about 58 years ago. She has a 18 pack-year smoking history. She has never used  smokeless tobacco. She reports that she does not drink alcohol and does not use drugs.  Allergies  Allergen Reactions  . Protonix [Pantoprazole Sodium]     DRY LIPS    Family History  Problem Relation Age of Onset  . Heart disease Mother        also hypertension and asthma  . Lung cancer Mother        dx 24s  . Ovarian cancer Mother        dx before 23  . Diabetes Half-Sister   . Breast cancer Half-Sister 3  .  Multiple myeloma Nephew 53       war-related exposures  . Colon cancer Neg Hx   . Colon polyps Neg Hx     Prior to Admission medications   Medication Sig Start Date End Date Taking? Authorizing Provider  acetaminophen (TYLENOL) 500 MG tablet Take 1,000 mg by mouth every 6 (six) hours as needed for moderate pain or headache.    [provider]  amLODipine (NORVASC) 10 MG tablet TAKE ONE TABLET BY MOUTH EVERYDAY AT BEDTIME 10/17/22   Antoine Poche, MD  aspirin EC 81 MG tablet Take 81 mg by mouth daily. Swallow whole.    [provider]  benzonatate (TESSALON) 200 MG capsule Take 1 capsule (200 mg total) by mouth 3 (three) times daily as needed for cough. 03/09/23   [name], MD  Blood Glucose Monitoring Suppl (ONE TOUCH ULTRA 2) w/Device KIT SMARTSIG:Via Meter 06/02/23   [provider]  carvedilol (COREG) 6.25 MG tablet TAKE ONE TABLET BY MOUTH BEFORE BREAKFAST and TAKE ONE TABLET BY MOUTH EVERYDAY AT BEDTIME Patient not taking: Reported on 06/09/2023 10/17/22   Antoine Poche, MD  Cholecalciferol (VITAMIN D3) 50 MCG (2000 UT) TABS TAKE ONE TABLET BY MOUTH daily in addition TO THE 1,000unit FOR total of 3,000units daily 05/02/22   Carnella Guadalajara, PA-C  Cholecalciferol 25 MCG (1000 UT) tablet TAKE ONE TABLET BY MOUTH daily along with THE 2,000unit FOR A total of 3,000units daily 05/02/22   Rojelio Brenner M, PA-C  citalopram (CELEXA) 10 MG tablet Take 10 mg by mouth daily. 02/09/23   [provider]  ferrous sulfate 325 (65 FE) MG EC tablet Take 1 tablet (325 mg total) by mouth daily with breakfast. 02/17/23   Carnella Guadalajara, PA-C  irbesartan-hydrochlorothiazide (AVALIDE) 300-12.5 MG tablet Take 1 tablet by mouth daily before breakfast. 10/17/22   Antoine Poche, MD  lansoprazole (PREVACID) 30 MG capsule TAKE ONE CAPSULE BY MOUTH EVERY MORNING 10/24/22   Tiffany Kocher, PA-C  metFORMIN (GLUCOPHAGE) 500 MG tablet Take 500 mg by mouth at  bedtime. 11/05/21   [provider]  mirtazapine (REMERON) 7.5 MG tablet Take 1 tablet (7.5 mg total) by mouth at bedtime. For depression and weight loss. 10/28/22   Doreatha Massed, MD  [facility] ULTRA test strip  06/02/23   [provider]  polyethylene glycol powder (GLYCOLAX/MIRALAX) 17 GM/SCOOP powder Take 1/2 capful daily as needed. Make sure to take a dose if you go more than 48 hours without a bowel movement. 11/01/21   Tiffany Kocher, PA-C  rosuvastatin (CRESTOR) 20 MG tablet TAKE ONE TABLET BY MOUTH EVERYDAY AT BEDTIME 10/17/22   Antoine Poche, MD  spironolactone (ALDACTONE) 25 MG tablet Take 25 mg by mouth every morning. 12/01/22   [provider]  triamcinolone ointment (KENALOG) 0.5 % Apply 1 application  topically 2 (two) times daily as needed (rash).  [provider]  Turmeric 500 MG CAPS Take 500 mg by mouth daily.    [provider]    Physical Exam: Vitals:   06/30/23 1858 06/30/23 1900  BP:  (!) 156/109  Pulse:  98  Resp:  17  Temp:  97.8 F (36.6 C)  TempSrc:  Oral  SpO2:  100%  Weight: 63 kg   Height: 5\' 2"  (1.575 m)    *** Data Reviewed: {Tip this will not be part of the note when signed- Document your independent interpretation of telemetry tracing, EKG, lab, Radiology test or any other diagnostic tests. Add any new diagnostic test ordered today. (Optional):26781} {Results:26384}  Assessment and Plan: No notes have been filed under this hospital service. Service: Hospitalist     Advance Care Planning:   Code Status: Prior ***  Consults: ***  Family Communication: ***  Severity of Illness: {Observation/Inpatient:21159}  Author: Frankey Shown, DO 06/30/2023 8:46 PM  For on call review [email or web address].

## 2023-06-30 NOTE — ED Notes (Signed)
CODE STROKE paged out at 1801

## 2023-07-01 ENCOUNTER — Telehealth: Payer: Self-pay | Admitting: Neurology

## 2023-07-01 ENCOUNTER — Observation Stay (HOSPITAL_COMMUNITY)
Admit: 2023-07-01 | Discharge: 2023-07-01 | Disposition: A | Discharge status: Home / Self Care | Payer: PPO | Attending: Internal Medicine

## 2023-07-01 DIAGNOSIS — Q670 Congenital facial asymmetry: Secondary | ICD-10-CM | POA: Diagnosis not present

## 2023-07-01 DIAGNOSIS — R569 Unspecified convulsions: Secondary | ICD-10-CM | POA: Diagnosis not present

## 2023-07-01 DIAGNOSIS — R4182 Altered mental status, unspecified: Secondary | ICD-10-CM

## 2023-07-01 DIAGNOSIS — N1831 Chronic kidney disease, stage 3a: Secondary | ICD-10-CM | POA: Insufficient documentation

## 2023-07-01 DIAGNOSIS — K219 Gastro-esophageal reflux disease without esophagitis: Secondary | ICD-10-CM | POA: Diagnosis not present

## 2023-07-01 DIAGNOSIS — E119 Type 2 diabetes mellitus without complications: Secondary | ICD-10-CM | POA: Diagnosis not present

## 2023-07-01 DIAGNOSIS — G9341 Metabolic encephalopathy: Secondary | ICD-10-CM | POA: Diagnosis not present

## 2023-07-01 DIAGNOSIS — I10 Essential (primary) hypertension: Secondary | ICD-10-CM | POA: Diagnosis not present

## 2023-07-01 DIAGNOSIS — E782 Mixed hyperlipidemia: Secondary | ICD-10-CM | POA: Diagnosis not present

## 2023-07-01 LAB — COMPREHENSIVE METABOLIC PANEL
ALT: 16 U/L (ref 0–44)
AST: 22 U/L (ref 15–41)
Albumin: 3.7 g/dL (ref 3.5–5.0)
Alkaline Phosphatase: 48 U/L (ref 38–126)
Anion gap: 7 (ref 5–15)
BUN: 28 mg/dL — ABNORMAL HIGH (ref 8–23)
CO2: 31 mmol/L (ref 22–32)
Calcium: 9.6 mg/dL (ref 8.9–10.3)
Chloride: 101 mmol/L (ref 98–111)
Creatinine, Ser: 1.04 mg/dL — ABNORMAL HIGH (ref 0.44–1.00)
GFR, Estimated: 54 mL/min — ABNORMAL LOW (ref >60)
Glucose, Bld: 99 mg/dL (ref 70–99)
Potassium: 4.5 mmol/L (ref 3.5–5.1)
Sodium: 139 mmol/L (ref 135–145)
Total Bilirubin: 0.5 mg/dL (ref 0.0–1.2)
Total Protein: 6.4 g/dL — ABNORMAL LOW (ref 6.5–8.1)

## 2023-07-01 LAB — CBC
HCT: 39.4 % (ref 36.0–46.0)
Hemoglobin: 12.7 g/dL (ref 12.0–15.0)
MCH: 25 pg — ABNORMAL LOW (ref 26.0–34.0)
MCHC: 32.2 g/dL (ref 30.0–36.0)
MCV: 77.6 fL — ABNORMAL LOW (ref 80.0–100.0)
Platelets: 291 10*3/uL (ref 150–400)
RBC: 5.08 MIL/uL (ref 3.87–5.11)
RDW: 15.6 % — ABNORMAL HIGH (ref 11.5–15.5)
WBC: 8.5 10*3/uL (ref 4.0–10.5)
nRBC: 0 % (ref 0.0–0.2)

## 2023-07-01 LAB — HEMOGLOBIN A1C
Hgb A1c MFr Bld: 5.8 % — ABNORMAL HIGH (ref 4.8–5.6)
Mean Plasma Glucose: 119.76 mg/dL

## 2023-07-01 LAB — GLUCOSE, CAPILLARY
Glucose-Capillary: 94 mg/dL (ref 70–99)
Glucose-Capillary: 172 mg/dL — ABNORMAL HIGH (ref 70–99)
Glucose-Capillary: 115 mg/dL — ABNORMAL HIGH (ref 70–99)

## 2023-07-01 LAB — VITAMIN B12: Vitamin B-12: 470 pg/mL (ref 180–914)

## 2023-07-01 MED ORDER — PANTOPRAZOLE SODIUM 40 MG PO TBEC
40.0000 mg | DELAYED_RELEASE_TABLET | Freq: Every day | ORAL | Status: DC
  Administered 2023-07-01: 40 mg via ORAL
  Filled 2023-07-01: qty 1

## 2023-07-01 MED ORDER — ACETAMINOPHEN 650 MG RE SUPP
650.0000 mg | Freq: Four times a day (QID) | RECTAL | Status: DC | PRN
Start: 2023-07-01 — End: 2023-07-01

## 2023-07-01 MED ORDER — ASPIRIN 81 MG PO TBEC
81.0000 mg | DELAYED_RELEASE_TABLET | Freq: Every day | ORAL | Status: DC
  Administered 2023-07-01: 81 mg via ORAL
  Filled 2023-07-01: qty 1

## 2023-07-01 MED ORDER — ACETAMINOPHEN 325 MG PO TABS: 650.0000 mg | ORAL_TABLET | Freq: Four times a day (QID) | ORAL | Status: DC | PRN

## 2023-07-01 MED ORDER — ROSUVASTATIN CALCIUM 20 MG PO TABS
20.0000 mg | ORAL_TABLET | Freq: Every day | ORAL | Status: DC
  Administered 2023-07-01: 20 mg via ORAL
  Filled 2023-07-01: qty 1

## 2023-07-01 MED ORDER — ONDANSETRON HCL 4 MG PO TABS: 4.0000 mg | ORAL_TABLET | Freq: Four times a day (QID) | ORAL | Status: DC | PRN

## 2023-07-01 MED ORDER — PRIMIDONE 50 MG PO TABS: 50.0000 mg | ORAL_TABLET | Freq: Every day | ORAL | 1 refills | Status: DC

## 2023-07-01 MED ORDER — CARVEDILOL 6.25 MG PO TABS: ORAL_TABLET | ORAL | 2 refills | Status: AC

## 2023-07-01 MED ORDER — INSULIN ASPART 100 UNIT/ML IJ SOLN
0.0000 [IU] | Freq: Three times a day (TID) | INTRAMUSCULAR | Status: DC
  Administered 2023-07-01: 2 [IU] via SUBCUTANEOUS

## 2023-07-01 MED ORDER — AMLODIPINE BESYLATE 5 MG PO TABS
10.0000 mg | ORAL_TABLET | Freq: Every day | ORAL | Status: DC
  Administered 2023-07-01: 10 mg via ORAL
  Filled 2023-07-01: qty 2

## 2023-07-01 MED ORDER — ONDANSETRON HCL 4 MG/2ML IJ SOLN: 4.0000 mg | Freq: Four times a day (QID) | INTRAMUSCULAR | Status: DC | PRN

## 2023-07-01 MED ORDER — PRIMIDONE 50 MG PO TABS: 50.0000 mg | ORAL_TABLET | Freq: Every day | ORAL | Status: DC

## 2023-07-01 MED ORDER — ENOXAPARIN SODIUM 40 MG/0.4ML IJ SOSY: 40.0000 mg | PREFILLED_SYRINGE | INTRAMUSCULAR | Status: DC

## 2023-07-01 MED ORDER — ENOXAPARIN SODIUM 30 MG/0.3ML IJ SOSY
30.0000 mg | PREFILLED_SYRINGE | INTRAMUSCULAR | Status: DC
  Administered 2023-07-01: 30 mg via SUBCUTANEOUS
  Filled 2023-07-01: qty 0.3

## 2023-07-01 NOTE — ED Provider Notes (Incomplete)
[place] EMERGENCY DEPARTMENT AT [facility] Provider Note   CSN: [id number] Arrival date & time: 06/30/23  1847     History {Add pertinent medical, surgical, social history, OB history to HPI:1} Chief Complaint  Patient presents with  . Facial Droop    Nancy Liu is a 83 y.o. female.  HPI Patient presents for strokelike symptoms.  Medical history includes CAD, HLD, HTN, anemia, GERD, DM, breast cancer.  She is prescribed a 1 mg aspirin.  She has not prescribed a blood thinner.  She has no known history of stroke or seizure.  Patient seem to be in her normal state of health earlier today.  She was last seen well at 3 PM.  At that time, she laid down for a nap.  When she woke up, 15 minutes prior to arrival, family noticed facial droop, slurred speech, word finding difficulty, confusion, and gait instability.  She typically ambulates without difficulty.    Home Medications Prior to Admission medications   Medication Sig Start Date End Date Taking? Authorizing Provider  acetaminophen (TYLENOL) 500 MG tablet Take 1,000 mg by mouth every 6 (six) hours as needed for moderate pain or headache.    [provider]  amLODipine (NORVASC) 10 MG tablet TAKE ONE TABLET BY MOUTH EVERYDAY AT BEDTIME 10/17/22   Antoine Poche, MD  aspirin EC 81 MG tablet Take 81 mg by mouth daily. Swallow whole.    [provider]  benzonatate (TESSALON) 200 MG capsule Take 1 capsule (200 mg total) by mouth 3 (three) times daily as needed for cough. 03/09/23   [name], MD  Blood Glucose Monitoring Suppl (ONE TOUCH ULTRA 2) w/Device KIT SMARTSIG:Via Meter 06/02/23   [provider]  carvedilol (COREG) 6.25 MG tablet TAKE ONE TABLET BY MOUTH BEFORE BREAKFAST and TAKE ONE TABLET BY MOUTH EVERYDAY AT BEDTIME Patient not taking: Reported on 06/09/2023 10/17/22   Antoine Poche, MD  Cholecalciferol (VITAMIN D3) 50 MCG (2000 UT) TABS TAKE ONE TABLET BY MOUTH daily in  addition TO THE 1,000unit FOR total of 3,000units daily 05/02/22   Carnella Guadalajara, PA-C  Cholecalciferol 25 MCG (1000 UT) tablet TAKE ONE TABLET BY MOUTH daily along with THE 2,000unit FOR A total of 3,000units daily 05/02/22   Rojelio Brenner M, PA-C  citalopram (CELEXA) 10 MG tablet Take 10 mg by mouth daily. 02/09/23   [provider]  ferrous sulfate 325 (65 FE) MG EC tablet Take 1 tablet (325 mg total) by mouth daily with breakfast. 02/17/23   Carnella Guadalajara, PA-C  irbesartan-hydrochlorothiazide (AVALIDE) 300-12.5 MG tablet Take 1 tablet by mouth daily before breakfast. 10/17/22   Antoine Poche, MD  lansoprazole (PREVACID) 30 MG capsule TAKE ONE CAPSULE BY MOUTH EVERY MORNING 10/24/22   Tiffany Kocher, PA-C  metFORMIN (GLUCOPHAGE) 500 MG tablet Take 500 mg by mouth at bedtime. 11/05/21   [provider]  mirtazapine (REMERON) 7.5 MG tablet Take 1 tablet (7.5 mg total) by mouth at bedtime. For depression and weight loss. 10/28/22   Doreatha Massed, MD  [facility] ULTRA test strip  06/02/23   [provider]  polyethylene glycol powder (GLYCOLAX/MIRALAX) 17 GM/SCOOP powder Take 1/2 capful daily as needed. Make sure to take a dose if you go more than 48 hours without a bowel movement. 11/01/21   Tiffany Kocher, PA-C  rosuvastatin (CRESTOR) 20 MG tablet TAKE ONE TABLET BY MOUTH EVERYDAY AT BEDTIME 10/17/22   Antoine Poche,  MD  spironolactone (ALDACTONE) 25 MG tablet Take 25 mg by mouth every morning. 12/01/22   [provider]  triamcinolone ointment (KENALOG) 0.5 % Apply 1 application  topically 2 (two) times daily as needed (rash).    [provider]  Turmeric 500 MG CAPS Take 500 mg by mouth daily.    [provider]      Allergies    Protonix [pantoprazole sodium]    Review of Systems   Review of Systems  Unable to perform ROS: Mental status change  Neurological:  Positive for facial asymmetry, speech difficulty and  weakness.  Psychiatric/Behavioral:  Positive for confusion.     Physical Exam Updated Vital Signs BP (!) 156/109   Pulse 98   Temp 97.8 F (36.6 C) (Oral)   Resp 17   Ht 5\' 2"  (1.575 m)   Wt 63 kg   SpO2 100%   BMI 25.40 kg/m  Physical Exam Vitals and nursing note reviewed.  Constitutional:      General: She is not in acute distress.    Appearance: Normal appearance. She is well-developed. She is not toxic-appearing or diaphoretic.  HENT:     Head: Normocephalic and atraumatic.     Right Ear: External ear normal.     Left Ear: External ear normal.     Nose: Nose normal.     Mouth/Throat:     Mouth: Mucous membranes are moist.  Eyes:     Extraocular Movements: Extraocular movements intact.     Conjunctiva/sclera: Conjunctivae normal.  Cardiovascular:     Rate and Rhythm: Normal rate and regular rhythm.  Pulmonary:     Effort: Pulmonary effort is normal. No respiratory distress.  Abdominal:     General: There is no distension.     Palpations: Abdomen is soft.  Musculoskeletal:        General: No swelling.     Cervical back: Normal range of motion and neck supple.  Skin:    General: Skin is warm and dry.     Coloration: Skin is not jaundiced or pale.  Neurological:     Mental Status: She is alert.     Cranial Nerves: Facial asymmetry present.     Sensory: No sensory deficit.     Motor: Weakness (Global) present.     Comments: Word finding difficulty  Psychiatric:        Speech: Speech is delayed.        Behavior: Behavior is slowed and withdrawn.     ED Results / Procedures / Treatments   Labs (all labs ordered are listed, but only abnormal results are displayed) Labs Reviewed  PROTIME-INR  APTT  CBC  DIFFERENTIAL  COMPREHENSIVE METABOLIC PANEL  ETHANOL  RAPID URINE DRUG SCREEN, HOSP PERFORMED  URINALYSIS, ROUTINE W REFLEX MICROSCOPIC  I-STAT CHEM 8, ED    EKG None  Radiology No results found.  Procedures Procedures  {Document cardiac  monitor, telemetry assessment procedure when appropriate:1}  Medications Ordered in ED Medications - No data to display  ED Course/ Medical Decision Making/ A&P   {   Click here for ABCD2, HEART and other calculatorsREFRESH Note before signing :1}                              Medical Decision Making Amount and/or Complexity of Data Reviewed Labs: ordered. Radiology: ordered.  Risk Prescription drug management. Decision regarding hospitalization.   This patient presents to the  ED for concern of altered mental status, strokelike symptoms, this involves an extensive number of treatment options, and is a complaint that carries with it a high risk of complications and morbidity.  The differential diagnosis includes CVA, TIA, complex migraine, seizure, infection, metabolic derangements, polypharmacy, neurocognitive disorder   Co morbidities that complicate the patient evaluation  CAD, HLD, HTN, anemia, GERD, DM, breast cancer   Additional history obtained:  Additional history obtained from patient's daughter External records from outside source obtained and reviewed including EMR   Lab Tests:  I Ordered, and personally interpreted labs.  The pertinent results include:  ***   Imaging Studies ordered:  I ordered imaging studies including ***  I independently visualized and interpreted imaging which showed *** I agree with the radiologist interpretation   Cardiac Monitoring: / EKG:  The patient was maintained on a cardiac monitor.  I personally viewed and interpreted the cardiac monitored which showed an underlying rhythm of: ***   Consultations Obtained:  I requested consultation with the ***,  and discussed lab and imaging findings as well as pertinent plan - they recommend: ***   Problem List / ED Course / Critical interventions / Medication management  Patient presenting for strokelike symptoms.  This was identified by family approximately 15 minutes prior to  arrival.  She was last seen well at approximately 3 PM, when she laid down for a nap.  On arrival, patient is awake and alert.  She has word finding difficulty.  History from patient herself is limited.  She does have appears to be a very slight facial droop.  She denies areas of diminished sensation.  She appears to have global weakness with mild upper extremity intention tremor.  She has difficult time following directions, which limits exam.  Given timeline, code stroke was initiated.***. I ordered medication including ***  for ***  Reevaluation of the patient after these medicines showed that the patient {resolved/improved/worsened:23923::"improved"} I have reviewed the patients home medicines and have made adjustments as needed   Social Determinants of Health:  ***   Test / Admission - Considered:  ***   {Document critical care time when appropriate:1} {Document review of labs and clinical decision tools ie heart score, Chads2Vasc2 etc:1}  {Document your independent review of radiology images, and any outside records:1} {Document your discussion with family members, caretakers, and with consultants:1} {Document social determinants of health affecting pt's care:1} {Document your decision making why or why not admission, treatments were needed:1} Final Clinical Impression(s) / ED Diagnoses Final diagnoses:  None    Rx / DC Orders ED Discharge Orders     None

## 2023-07-01 NOTE — Progress Notes (Signed)
EEG complete - results pending

## 2023-07-01 NOTE — Care Management Obs Status (Signed)
MEDICARE OBSERVATION STATUS NOTIFICATION   Patient Details  Name: Nancy Liu MRN: [id number] Date of Birth: [birth year]   Medicare Observation Status Notification Given:  Josiah Lobo., CMA, verbally reviewed observation notice with [name] and daughter [name] in room.  [name] signed notice due to [name] being confused. Copy provided.    Corey Harold 07/01/2023, 11:06 AM

## 2023-07-01 NOTE — Plan of Care (Signed)
  Problem: Education: Goal: Knowledge of General Education information will improve Description: Including pain rating scale, medication(s)/side effects and non-pharmacologic comfort measures Outcome: Progressing   Problem: Activity: Goal: Risk for activity intolerance will decrease Outcome: Progressing   Problem: Elimination: Goal: Will not experience complications related to bowel motility Outcome: Progressing   Problem: Pain Managment: Goal: General experience of comfort will improve and/or be controlled Outcome: Progressing

## 2023-07-01 NOTE — Care Plan (Signed)
Reviewed MRI brain without contrast.  No acute abnormality.  If patient's mental status is improving, okay to follow-up with neurology as an outpatient.  Discussed plan with Dr. Gwenlyn Perking via secure chat.  Nancy Liu Nancy Liu

## 2023-07-01 NOTE — Procedures (Signed)
Patient Name: Nancy Liu  MRN: [id number]  Epilepsy Attending: Charlsie Quest  Referring Physician/Provider: Frankey Shown, DO  Date: 07/01/2023 Duration: 23.46 mins  Patient history: 83yo F with ams. EEG to evaluate for seizure  Level of alertness: Awake  AEDs during EEG study: None  Technical aspects: This EEG study was done with scalp electrodes positioned according to the 10-20 International system of electrode placement. Electrical activity was reviewed with band pass filter of 1-70Hz , sensitivity of 7 uV/mm, display speed of 53mm/sec with a 60Hz  notched filter applied as appropriate. EEG data were recorded continuously and digitally stored.  Video monitoring was available and reviewed as appropriate.  Description: EEG showed continuous generalized polymorphic sharply contoured 3 to 5 Hz theta-delta slowing. Photic driving was not seen during photic stimulation. Hyperventilation was not performed.     ABNORMALITY - Continuous slow, generalized  IMPRESSION: This study is suggestive of moderate diffuse encephalopathy. No seizures or epileptiform discharges were seen throughout the recording.   Jamarius Saha Annabelle Harman

## 2023-07-01 NOTE — Discharge Summary (Signed)
Physician Discharge Summary   Patient: Nancy Liu MRN: [id number] DOB: [birth year]  Admit date:     06/30/2023  Discharge date: 07/01/23  Discharge Physician: Vassie Loll   PCP: Renaye Rakers, MD   Recommendations at discharge:  Reassess blood pressure and adjust antihypertensive regimen as needed Follow patient response to the use of Primatene regarding improvement in her essential tremors Make sure patient follow-up with neurology as recommended service for further evaluation/management of ongoing cognitive impairment and dementia rule out.. Repeat basic metabolic panel to follow electrolytes and renal function.  Discharge Diagnoses: Principal Problem:   Acute metabolic encephalopathy Active Problems:   Mixed hyperlipidemia   Essential hypertension   Non-insulin dependent type 2 diabetes mellitus (HCC)   GERD (gastroesophageal reflux disease)   Facial asymmetry   Chronic kidney disease, stage 3a [facility])  Brief Hospital admission course: As per H&P written by Dr. Thomes Dinning on 06/30/2023 Nancy Liu is a 83 y.o. female with medical history significant of hypertension, hyperlipidemia, T2DM, tremor (unspecified), CAD who presents to the emergency department due to concern for strokelike symptoms. Apparently, patient has been having issues with memory for about 1 year, this was in the last 2 weeks and today she went to bed around 3 PM, on waking up, daughter noted facial asymmetry and was concerned that patient may be having stroke, EMS was activated and patient was sent to the ED for further evaluation and management. Patient has also been having confusion and hallucinations for about 2 weeks.  She has had nonspecific tremors for about a year, but this has also worsened.  She also lost about 14 pounds within the last 2 months.  She denies chest pain, shortness of breath, nausea vomiting Neurologist (Dr. Amada Jupiter was consulted and recommended MRI brain, CT angiography or MRA of  the head and neck, EEG, CMP, CBC, TSH, ammonia, UDS and UA.  Stroke workup to be pursued only if MRI or CT angiography is positive.   ED Course:  In the emergency department, BP was 152/109, other vitals are within normal range.  Workup in the ED showed normal CBC except for MCV of 77.8.  BMP was positive for chloride 96, blood glucose 107, BUN 36, creatinine 1.17.  Alcohol level is less than 10, ammonia was less than 10. MRI head without contrast showed no evidence of acute intracranial abnormality CT angiography head and neck with and without contrast showed no intracranial large vessel occlusion.  Mild stenosis in the proximal right M2 and at the P1-P2 junction CT head without contrast showed no evidence of acute intracranial abnormality IV LR 500 mL x 1 was given.  Hospitalist was asked to admit patient for further evaluation and management.  Assessment and Plan: 1-acute metabolic encephalopathy/tremors -Patient and family reporting intermittent ongoing worsening confusion and appreciated tremors. -Normal TSH, ammonia, B12, negative UDS and CT scan/MRI without signs of acute intracranial normalities. -EEG also reassuring and ruling out the presence of epilepsy. -Neurology service recommending outpatient follow-up for further evaluation, Mini-Mental status exam and further rule out dementia process. -Resumption of patient's home carvedilol and the use of primidone to assist with essential tremors has been provided. -Patient will continue the use of aspirin and Refacto modification for secondary prevention. -Stroke has been ruled out.  2-hypertension -Home antihypertensive agents has been resumed -Aldactone on hold/discontinued discharge in the setting of acute kidney injury. -Reassess blood pressure and adjust antihypertensive treatment as needed.  3-mixed hyperlipidemia -Continue Crestor.  4-acute kidney injury on chronic kidney disease  stage IIIa at baseline -Patient advised to  maintain adequate hydration -Aldactone has been started discontinued discharge -Renal function back to baseline after fluid resuscitation and holding nephrotoxic agents -Will resume the use of ARB/HCTZ at discharge.  5-GERD -Continue PPI.  6-type 2 diabetes with nephropathy -Resume home hypoglycemic regimen -Continue to follow CBG fluctuation and adjust regimen as needed.  7-depression/decreased appetite -Continue treatment with Remeron   Consultants: Neurology Procedures performed: See below for x-ray reports; EEG. Disposition: Home Diet recommendation: Heart healthy and modified carbohydrate diet discussed with patient.  DISCHARGE MEDICATION: Allergies as of 07/01/2023       Reactions   Protonix [pantoprazole Sodium]    DRY LIPS        Medication List     STOP taking these medications    PEG-3350/Electrolytes 236 g Solr   spironolactone 25 MG tablet Commonly known as: ALDACTONE       TAKE these medications    amLODipine 10 MG tablet Commonly known as: NORVASC TAKE ONE TABLET BY MOUTH EVERYDAY AT BEDTIME   aspirin EC 81 MG tablet Take 81 mg by mouth daily. Swallow whole.   carvedilol 6.25 MG tablet Commonly known as: COREG TAKE ONE TABLET BY MOUTH BEFORE BREAKFAST and TAKE ONE TABLET BY MOUTH EVERYDAY AT BEDTIME   ferrous sulfate 325 (65 FE) MG EC tablet Take 1 tablet (325 mg total) by mouth daily with breakfast.   irbesartan-hydrochlorothiazide 300-12.5 MG tablet Commonly known as: AVALIDE Take 1 tablet by mouth daily before breakfast.   lansoprazole 30 MG capsule Commonly known as: PREVACID TAKE ONE CAPSULE BY MOUTH EVERY MORNING   metFORMIN 500 MG tablet Commonly known as: GLUCOPHAGE Take 500 mg by mouth at bedtime.   mirtazapine 7.5 MG tablet Commonly known as: REMERON Take 1 tablet (7.5 mg total) by mouth at bedtime. For depression and weight loss.   ONE TOUCH ULTRA 2 w/Device Kit SMARTSIG:Via Meter   OneTouch Ultra test  strip Generic drug: glucose blood   polyethylene glycol powder 17 GM/SCOOP powder Commonly known as: GLYCOLAX/MIRALAX Take 1/2 capful daily as needed. Make sure to take a dose if you go more than 48 hours without a bowel movement.   primidone 50 MG tablet Commonly known as: MYSOLINE Take 1 tablet (50 mg total) by mouth at bedtime.   rosuvastatin 20 MG tablet Commonly known as: CRESTOR TAKE ONE TABLET BY MOUTH EVERYDAY AT BEDTIME   triamcinolone ointment 0.5 % Commonly known as: KENALOG Apply 1 application  topically 2 (two) times daily as needed (rash).   cholecalciferol 25 MCG (1000 UNIT) tablet Commonly known as: VITAMIN D3 Take 1,000 Units by mouth daily.   Vitamin D3 50 MCG (2000 UT) Tabs TAKE ONE TABLET BY MOUTH daily in addition TO THE 1,000unit FOR total of 3,000units daily        Follow-up Information     Renaye Rakers, MD. Schedule an appointment as soon as possible for a visit in 2 week(s).   Specialty: Family Medicine Contact information: [address] [place] [address] [phone]                Discharge Exam: Filed Weights   06/30/23 1858  Weight: 63 kg   General exam: Alert, awake, following commands appropriately and adequately answering questions.  No chest pain, no nausea, no vomiting.  Positive essential tremors appreciated. Respiratory system: Clear to auscultation. Respiratory effort normal.  Good saturation on room air. Cardiovascular system:RRR. No rubs or gallops; no JVD. Gastrointestinal system:  Abdomen is nondistended, soft and nontender. No organomegaly or masses felt. Normal bowel sounds heard. Central nervous system: Moving 4 limbs spontaneously.  No focal neurological deficits.  No facial droop.  Positive tremors. Extremities: No cyanosis or clubbing. Skin: No petechiae. Psychiatry: Flat affect appreciated on exam.   Condition at discharge: stable  The results of significant diagnostics from this hospitalization  (including imaging, microbiology, ancillary and laboratory) are listed below for reference.   Imaging Studies: EEG adult Result Date: 07/01/2023 Charlsie Quest, MD     07/01/2023  4:03 PM Patient Name: EIRENE RATHER MRN: [id number] Epilepsy Attending: Charlsie Quest Referring Physician/Provider: Frankey Shown, DO Date: 07/01/2023 Duration: 23.46 mins Patient history: 83yo F with ams. EEG to evaluate for seizure Level of alertness: Awake AEDs during EEG study: None Technical aspects: This EEG study was done with scalp electrodes positioned according to the 10-20 International system of electrode placement. Electrical activity was reviewed with band pass filter of 1-70Hz , sensitivity of 7 uV/mm, display speed of 10mm/sec with a 60Hz  notched filter applied as appropriate. EEG data were recorded continuously and digitally stored.  Video monitoring was available and reviewed as appropriate. Description: EEG showed continuous generalized polymorphic sharply contoured 3 to 5 Hz theta-delta slowing. Photic driving was not seen during photic stimulation. Hyperventilation was not performed.   ABNORMALITY - Continuous slow, generalized IMPRESSION: This study is suggestive of moderate diffuse encephalopathy. No seizures or epileptiform discharges were seen throughout the recording. Charlsie Quest   MR BRAIN WO CONTRAST Result Date: 06/30/2023 CLINICAL DATA:  Transient ischemic attack (TIA) EXAM: MRI HEAD WITHOUT CONTRAST TECHNIQUE: Multiplanar, multiecho pulse sequences of the brain and surrounding structures were obtained without intravenous contrast. COMPARISON:  CT head from today. FINDINGS: Brain: No acute infarction, hemorrhage, hydrocephalus, extra-axial collection or mass lesion. Vascular: Major arterial flow voids are maintained. Skull and upper cervical spine: Normal marrow signal. Sinuses/Orbits: Clear sinuses.  No acute orbital findings. IMPRESSION: No evidence of acute intracranial abnormality.  Electronically Signed   By: Feliberto Harts M.D.   On: 06/30/2023 21:09   CT ANGIO HEAD NECK W WO CM Result Date: 06/30/2023 CLINICAL DATA:  Transient ischemic attack EXAM: CT ANGIOGRAPHY HEAD AND NECK WITH AND WITHOUT CONTRAST TECHNIQUE: Multidetector CT imaging of the head and neck was performed using the standard protocol during bolus administration of intravenous contrast. Multiplanar CT image reconstructions and MIPs were obtained to evaluate the vascular anatomy. Carotid stenosis measurements (when applicable) are obtained utilizing NASCET criteria, using the distal internal carotid diameter as the denominator. RADIATION DOSE REDUCTION: This exam was performed according to the departmental dose-optimization program which includes automated exposure control, adjustment of the mA and/or kV according to patient size and/or use of iterative reconstruction technique. CONTRAST:  75mL OMNIPAQUE IOHEXOL 350 MG/ML SOLN COMPARISON:  06/30/2023 CT head, no prior CTA available FINDINGS: CT HEAD FINDINGS For noncontrast findings, please see same day CT head. CTA NECK FINDINGS Aortic arch: Standard branching. Imaged portion shows no evidence of aneurysm or dissection. No significant stenosis of the major arch vessel origins. Right carotid system: No evidence of dissection, occlusion, or hemodynamically significant stenosis (greater than 50%). Left carotid system: No evidence of dissection, occlusion, or hemodynamically significant stenosis (greater than 50%). Vertebral arteries: The right vertebral artery is not visualized from its origin through the distal V2 segment, where it likely reconstitutes via collaterals. Diminutive right V3 segment. The left vertebral artery is patent from its origin to the skull base without significant stenosis. No  evidence of dissection. Skeleton: No acute osseous abnormality. Degenerative changes in the cervical spine. Other neck: 1.7 cm heterogeneously enhancing mass in the posterior  right thyroid lobe. With calcifications but Upper chest: No focal pulmonary opacity or pleural effusion. Review of the MIP images confirms the above findings CTA HEAD FINDINGS Anterior circulation: Both internal carotid arteries are patent to the termini, without significant stenosis. Patent right A1. Aplastic left A1. Normal anterior communicating artery. Anterior cerebral arteries are patent to their distal aspects without significant stenosis. No M1 stenosis or occlusion. Mild stenosis in the proximal right M2 (series 6, image 102 and series 8, image 73). MCA branches perfused to their distal aspects without significant stenosis. Posterior circulation: Vertebral arteries patent to the vertebrobasilar junction without significant stenosis, although the right vertebral artery remains diminutive and less well opacified. Posterior inferior cerebellar arteries patent proximally. Basilar patent to its distal aspect without significant stenosis. Superior cerebellar arteries patent proximally. Hypoplastic right P1. Aplastic left P1. Fetal origin of the left PCA and near fetal origin of the right PCA from the posterior communicating arteries. Mild stenosis at the P1-P2 junction (series 6, image 102). Evaluation of the more distal PCAs is somewhat limited by venous contamination. Venous sinuses: As permitted by contrast timing, patent. Anatomic variants: Fetal origin of left PCA. Near fetal origin of the right PCA. No evidence of aneurysm or vascular malformation. Review of the MIP images confirms the above findings IMPRESSION: 1. No intracranial large vessel occlusion. Mild stenosis in the proximal right M2 and at the P1-P2 junction. 2. The right vertebral artery is not visualized from its origin through the distal V2 segment, where it likely reconstitutes via collaterals. Diminutive right V3 segment. 3. No additional hemodynamically significant stenosis in the neck. 4. 1.7 cm heterogeneously enhancing mass in the  posterior right thyroid lobe. If this has not previously been evaluated, consider a non-emergent ultrasound of the thyroid, taking into account the patient's age and comorbidities. (Reference: J Am Coll Radiol. 2015 Feb;12(2): 143-50) Electronically Signed   By: Wiliam Ke M.D.   On: 06/30/2023 20:33   CT HEAD CODE STROKE WO CONTRAST Result Date: 06/30/2023 CLINICAL DATA:  Code stroke.  Neuro deficit, acute, stroke suspected EXAM: CT HEAD WITHOUT CONTRAST TECHNIQUE: Contiguous axial images were obtained from the base of the skull through the vertex without intravenous contrast. RADIATION DOSE REDUCTION: This exam was performed according to the departmental dose-optimization program which includes automated exposure control, adjustment of the mA and/or kV according to patient size and/or use of iterative reconstruction technique. COMPARISON:  MRI head May 3, 24. FINDINGS: Brain: No evidence of acute large vascular territory infarction, hemorrhage, hydrocephalus, extra-axial collection or mass lesion/mass effect. Vascular: No hyperdense vessel. Skull: No acute fracture. Sinuses/Orbits: Clear sinuses.  No acute orbital findings. Other: No mastoid effusions. ASPECTS [facility] Stroke Program Early CT Score) Total score (0-10 with 10 being normal): 10. IMPRESSION: 1. No evidence of acute intracranial abnormality. 2. ASPECTS is 10. Code stroke imaging results were communicated on 06/30/2023 at 7:17 pm to provider Dr. Durwin Nora via telephone, who verbally acknowledged these results. Electronically Signed   By: Feliberto Harts M.D.   On: 06/30/2023 19:17   DG Chest 2 View Result Date: 06/28/2023 CLINICAL DATA:  Productive cough and chest pain for several days. EXAM: CHEST - 2 VIEW COMPARISON:  None Available. FINDINGS: The heart size and mediastinal contours are within normal limits. Both lungs are clear. The visualized skeletal structures are unremarkable. IMPRESSION: No active cardiopulmonary disease.  Electronically  Signed   By: Danae Orleans M.D.   On: 06/28/2023 13:35    Microbiology: Results for orders placed or performed during the hospital encounter of 06/28/23  Resp panel by RT-PCR (RSV, Flu A&B, Covid) Anterior Nasal Swab     Status: None   Collection Time: 06/28/23 12:04 PM   Specimen: Anterior Nasal Swab  Result Value Ref Range Status   SARS Coronavirus 2 by RT PCR NEGATIVE NEGATIVE Final    Comment: (NOTE) SARS-CoV-2 target nucleic acids are NOT DETECTED.  The SARS-CoV-2 RNA is generally detectable in upper respiratory specimens during the acute phase of infection. The lowest concentration of SARS-CoV-2 viral copies this assay can detect is 138 copies/mL. A negative result does not preclude SARS-Cov-2 infection and should not be used as the sole basis for treatment or other patient management decisions. A negative result may occur with  improper specimen collection/handling, submission of specimen other than nasopharyngeal swab, presence of viral mutation(s) within the areas targeted by this assay, and inadequate number of viral copies(<138 copies/mL). A negative result must be combined with clinical observations, patient history, and epidemiological information. The expected result is Negative.  Fact Sheet for Patients:  BloggerCourse.com  Fact Sheet for Healthcare Providers:  SeriousBroker.it  This test is no t yet approved or cleared by the [place] FDA and  has been authorized for detection and/or diagnosis of SARS-CoV-2 by FDA under an Emergency Use Authorization (EUA). This EUA will remain  in effect (meaning this test can be used) for the duration of the COVID-19 declaration under Section 564(b)(1) of the Act, 21 U.S.C.section 360bbb-3(b)(1), unless the authorization is terminated  or revoked sooner.       Influenza A by PCR NEGATIVE NEGATIVE Final   Influenza B by PCR NEGATIVE NEGATIVE Final    Comment: (NOTE) The  Xpert Xpress SARS-CoV-2/FLU/RSV plus assay is intended as an aid in the diagnosis of influenza from Nasopharyngeal swab specimens and should not be used as a sole basis for treatment. Nasal washings and aspirates are unacceptable for Xpert Xpress SARS-CoV-2/FLU/RSV testing.  Fact Sheet for Patients: BloggerCourse.com  Fact Sheet for Healthcare Providers: SeriousBroker.it  This test is not yet approved or cleared by the [place] FDA and has been authorized for detection and/or diagnosis of SARS-CoV-2 by FDA under an Emergency Use Authorization (EUA). This EUA will remain in effect (meaning this test can be used) for the duration of the COVID-19 declaration under Section 564(b)(1) of the Act, 21 U.S.C. section 360bbb-3(b)(1), unless the authorization is terminated or revoked.     Resp Syncytial Virus by PCR NEGATIVE NEGATIVE Final    Comment: (NOTE) Fact Sheet for Patients: BloggerCourse.com  Fact Sheet for Healthcare Providers: SeriousBroker.it  This test is not yet approved or cleared by the [place] FDA and has been authorized for detection and/or diagnosis of SARS-CoV-2 by FDA under an Emergency Use Authorization (EUA). This EUA will remain in effect (meaning this test can be used) for the duration of the COVID-19 declaration under Section 564(b)(1) of the Act, 21 U.S.C. section 360bbb-3(b)(1), unless the authorization is terminated or revoked.  Performed at [facility], [address] [place], [place] [address]     Labs: CBC: Recent Labs  Lab 06/28/23 1320 06/30/23 1926 06/30/23 1933 07/01/23 0418  WBC 10.6* 9.3  --  8.5  NEUTROABS  --  6.9  --   --   HGB 13.9 13.2 15.0 12.7  HCT 44.9 41.1 44.0 39.4  MCV 78.5*  77.8*  --  77.6*  PLT 339 312  --  291   Basic Metabolic Panel: Recent Labs  Lab 06/28/23 1320 06/30/23 1926 06/30/23 1933 07/01/23 0418   NA 136 135 136 139  K 3.7 4.0 3.9 4.5  CL 98 96* 97* 101  CO2 28 28  --  31  GLUCOSE 96 107* 102* 99  BUN 18 36* 34* 28*  CREATININE 1.21* 1.17* 1.30* 1.04*  CALCIUM 9.5 9.7  --  9.6   Liver Function Tests: Recent Labs  Lab 06/30/23 1926 07/01/23 0418  AST 25 22  ALT 17 16  ALKPHOS 52 48  BILITOT 0.7 0.5  PROT 6.9 6.4*  ALBUMIN 4.1 3.7   CBG: Recent Labs  Lab 06/30/23 1901 07/01/23 0752 07/01/23 1111  GLUCAP 104* 94 172*    Discharge time spent: greater than 30 minutes.  Signed: Vassie Loll, MD [facility] 07/01/2023

## 2023-07-01 NOTE — Telephone Encounter (Signed)
Patient daughter wanted to let Dr Everlena Cooper know that her mother is at [facility], she made appt for her on 07-03-23 to see Dr Everlena Cooper if she is out by then

## 2023-07-01 NOTE — Progress Notes (Addendum)
   07/01/23 1303  TOC Brief Assessment  Insurance and Status Reviewed  Patient has primary care physician Yes  Home environment has been reviewed Home with children  Prior level of function: need assistance  Prior/Current Home Services No current home services  Social Drivers of Health Review SDOH reviewed no interventions necessary  Readmission risk has been reviewed Yes  Transition of care needs no transition of care needs at this time    CVA work up completed- no needs identified - DC later.  Transition of Care Department [facility]) has reviewed patient and no TOC needs have been identified at this time. We will continue to monitor patient advancement through interdisciplinary progression rounds. If new patient transition needs arise, please place a TOC consult.

## 2023-07-01 NOTE — Progress Notes (Signed)
Mobility Specialist Progress Note:    07/01/23 1202  Mobility  Activity Ambulated with assistance to bathroom;Ambulated with assistance in hallway  Level of Assistance Minimal assist, patient does 75% or more  Assistive Device Front wheel walker  Distance Ambulated (ft) 100 ft  Range of Motion/Exercises Active;All extremities  Activity Response Tolerated well  Mobility Referral Yes  Mobility visit 1 Mobility  Mobility Specialist Start Time (ACUTE ONLY) 1130  Mobility Specialist Stop Time (ACUTE ONLY) 1155  Mobility Specialist Time Calculation (min) (ACUTE ONLY) 25 min   Pt received in bed, family at bedside. Eager for mobility. Required MinA to stand and CGA to ambulate with RW. Tolerated well, asx throughout. Returned to room, left pt supine. Alarm on, all needs met.   Lawerance Bach Mobility Specialist Please contact via Special educational needs teacher or  Rehab office at [phone]

## 2023-07-01 NOTE — Plan of Care (Signed)

## 2023-07-02 ENCOUNTER — Encounter: Payer: Self-pay | Admitting: Internal Medicine

## 2023-07-02 NOTE — Progress Notes (Signed)
NEUROLOGY FOLLOW UP OFFICE NOTE  Nancy Liu [id number]  Assessment/Plan:   Major neurocognitive disorder.  Acute delirium may have been secondary to mild acute kidney insufficiency but she does exhibit cognitive impairment over the course of some time.  Imaging not suggestive of cerebrovascular etiology.  Concern for underlying neurodegenerative disease Tremor.  Action tremor but she does exhibit some mild bradykinesia, raising concern of possible underlying Parkinsonian disease Questionable TIA - only because she may have had possibly asymmetry at home.  Low suspicion.     Refer for formal neuropsychological evaluation Will hold off on starting primidone until complete workup Medications will need to be managed by others Follow up after testing.  Total time spent in chart and face to face with patient and family:  64 minutes  Subjective:  Nancy Liu is an 83 year old right-handed female with CAD, HTN, HLD, DM II, cerebrovascular disease, Thalassemia minor whom I see for tremor presents for recent hospitalization for altered mental status/TIA.  History obtained by hospital records.  Accompanied by her daughter and son who supplements history.  MRIs of brain and CTA personally reviewed.  UPDATE: MRI of brain 10/03/2022 showed mild chronic small vessel ischemic changes.    She lives with her daughter.  Over the past couple of weeks, she had become increasingly confused and forgetful.  She would frequently misplace objects, confuse names of items but would also not know where she was, even at home.  She was also not taking her medications correctly.  On 06/30/2023, she appeared to have some facial asymmetry, so she was admitted to [facility].  Because family endorsed facial asymmetry, she was a stroke code.  NIH stroke scale was 0.  CT head was negative.  CTA head and neck showed mild stenosis in the proximal right M2 and P1-P2 junction, and right vertebral artery not  visualized from its origin through distal V2 segment where it likely reconstitutes via collaterals and diminutive right V3 segment but no emergent LVO.  MRI of brain again showed mild chronic small vessel ischemic changes in the cerebral white matter, but no acute findings.  EEG revealed moderate diffuse slowing but no seizures or epileptiform discharges.  Labs revealed slight worsening of kidney function (Cr 1.21 and GFR 45).  Otherwise, labs largely normal/unremarkable, including CBC, CMP, TSH, B12, ammonia and UDS.  Due to worsening tremor, she was prescribed primidone but she has not picked it up yet.  For the past 3 months, she has been losing more weight due to poor appetite.  She may be agitated when she has difficulty explaining things but she isn't combative.  She does experience hallucinations. Sometimes she sees shadows or she may hear children outside the window who aren't there.  She has been able to perform ADLs such as bathing, dressing and using the toilet independently.      HISTORY: Symptoms started when Covid pandemic happened.  Increased depression.  Nightmares.  Decreased appetite and lost weight.  She reports problems with balance, meaning she stumbles and feels lightheaded.  Being out in the heat and sun aggravates it .  When she is eating sometimes, her hand is shaking if she is holding utensils.  She may notice it in the left hand too. Needs to hold it with her other hand to stop it.  When sitting, her legs may start shaking.  She also reports cramps in feet.  No family history of tremor.    PAST MEDICAL HISTORY: Past  Medical History:  Diagnosis Date   Arteriosclerotic cardiovascular disease (ASCVD) 2001   Bare-metal stent placed in the right coronary artery in 12/01; residual 50% lesion of the first diagonal and mid LAD   Breast cancer [facility])    Cerebrovascular disease    COPD (chronic obstructive pulmonary disease) (HCC)    Cyst, dermoid, arm, left 03/26/2018   Diabetes  mellitus    excellent control with a low-dose of a single oral agent   Family history of breast cancer 01/23/2021   Family history of ovarian cancer 01/23/2021   GERD (gastroesophageal reflux disease)    with ulcers   History of right coronary artery stent placement 2000   Hyperlipidemia    Hypertension    Sleep apnea    Thalassemia minor    Tobacco abuse, in remission    35 pack years; Quit in 1980    MEDICATIONS: Current Outpatient Medications on File Prior to Visit  Medication Sig Dispense Refill   amLODipine (NORVASC) 10 MG tablet TAKE ONE TABLET BY MOUTH EVERYDAY AT BEDTIME 90 tablet 3   aspirin EC 81 MG tablet Take 81 mg by mouth daily. Swallow whole.     Blood Glucose Monitoring Suppl (ONE TOUCH ULTRA 2) w/Device KIT SMARTSIG:Via Meter     carvedilol (COREG) 6.25 MG tablet TAKE ONE TABLET BY MOUTH BEFORE BREAKFAST and TAKE ONE TABLET BY MOUTH EVERYDAY AT BEDTIME 60 tablet 2   cholecalciferol (VITAMIN D3) 25 MCG (1000 UNIT) tablet Take 1,000 Units by mouth daily.     Cholecalciferol (VITAMIN D3) 50 MCG (2000 UT) TABS TAKE ONE TABLET BY MOUTH daily in addition TO THE 1,000unit FOR total of 3,000units daily 30 tablet 1   ferrous sulfate 325 (65 FE) MG EC tablet Take 1 tablet (325 mg total) by mouth daily with breakfast. 90 tablet 3   irbesartan-hydrochlorothiazide (AVALIDE) 300-12.5 MG tablet Take 1 tablet by mouth daily before breakfast. 90 tablet 3   lansoprazole (PREVACID) 30 MG capsule TAKE ONE CAPSULE BY MOUTH EVERY MORNING 180 capsule 3   metFORMIN (GLUCOPHAGE) 500 MG tablet Take 500 mg by mouth at bedtime.     mirtazapine (REMERON) 7.5 MG tablet Take 1 tablet (7.5 mg total) by mouth at bedtime. For depression and weight loss. 30 tablet 3   ONETOUCH ULTRA test strip      polyethylene glycol powder (GLYCOLAX/MIRALAX) 17 GM/SCOOP powder Take 1/2 capful daily as needed. Make sure to take a dose if you go more than 48 hours without a bowel movement. 255 g 3   primidone (MYSOLINE)  50 MG tablet Take 1 tablet (50 mg total) by mouth at bedtime. 30 tablet 1   rosuvastatin (CRESTOR) 20 MG tablet TAKE ONE TABLET BY MOUTH EVERYDAY AT BEDTIME 90 tablet 3   triamcinolone ointment (KENALOG) 0.5 % Apply 1 application  topically 2 (two) times daily as needed (rash).     No current facility-administered medications on file prior to visit.    ALLERGIES: Allergies  Allergen Reactions   Protonix [Pantoprazole Sodium]     DRY LIPS    FAMILY HISTORY: Family History  Problem Relation Age of Onset   Heart disease Mother        also hypertension and asthma   Lung cancer Mother        dx 16s   Ovarian cancer Mother        dx before 65   Diabetes Half-Sister    Breast cancer Half-Sister 49   Multiple myeloma Nephew 61  war-related exposures   Colon cancer Neg Hx    Colon polyps Neg Hx       Objective:  Blood pressure 129/79, pulse 77, height 5\' 2"  (1.575 m), weight 136 lb (61.7 kg), SpO2 100%. General: No acute distress.  Patient appears well-groomed.   Head:  Normocephalic/atraumatic Eyes:  Fundi examined but not visualized Neck: supple, no paraspinal tenderness, full range of motion Heart:  Regular rate and rhythm Neurological Exam: alert and oriented.  Speech fluent and not dysarthric.  Language intact.      07/03/2023   10:00 AM  [employer] Mental Exam  Weekday Correct 1  Current year 1  What state are we in? 1  Amount spent 1  Amount left 0  # of Animals 1  5 objects recall 2  Number series 0  Hour markers 0  Time correct 0  Placed X in triangle correctly 1  Largest Figure 1  Name of female 0  Date back to work 0  Type of work 0  State she lived in 0  Total score 9   CN II-XII intact.  Bulk and tone normal.  No rigidity but trace cogwheel rigidity in right wrist.  Muscle strength 5/5 throughout.  Finger-thumb tapping intact.  Trace tremor holding cup of water.  No tremor with writing or drawing Archimedes swirl.  No resting tremor.   Sensation to light touch intact.  Deep tendon reflexes 1+ throughout.  Finger to nose intact.  Gait normal posture and stride but reduced arm swing bilaterally. Romberg negative.    Shon Millet, DO  CC: Renaye Rakers, MD

## 2023-07-03 ENCOUNTER — Encounter: Payer: PPO | Admitting: Psychology

## 2023-07-03 ENCOUNTER — Encounter: Payer: Self-pay | Admitting: Neurology

## 2023-07-03 ENCOUNTER — Ambulatory Visit: Payer: PPO | Admitting: Neurology

## 2023-07-03 VITALS — BP 129/79 | HR 77 | Ht 62.0 in | Wt 136.0 lb

## 2023-07-03 DIAGNOSIS — I129 Hypertensive chronic kidney disease with stage 1 through stage 4 chronic kidney disease, or unspecified chronic kidney disease: Secondary | ICD-10-CM | POA: Diagnosis not present

## 2023-07-03 DIAGNOSIS — G250 Essential tremor: Secondary | ICD-10-CM

## 2023-07-03 DIAGNOSIS — K219 Gastro-esophageal reflux disease without esophagitis: Secondary | ICD-10-CM | POA: Diagnosis not present

## 2023-07-03 DIAGNOSIS — F0390 Unspecified dementia without behavioral disturbance: Secondary | ICD-10-CM

## 2023-07-03 DIAGNOSIS — G9341 Metabolic encephalopathy: Secondary | ICD-10-CM

## 2023-07-03 DIAGNOSIS — N1831 Chronic kidney disease, stage 3a: Secondary | ICD-10-CM | POA: Diagnosis not present

## 2023-07-03 DIAGNOSIS — E1122 Type 2 diabetes mellitus with diabetic chronic kidney disease: Secondary | ICD-10-CM | POA: Diagnosis not present

## 2023-07-03 DIAGNOSIS — R251 Tremor, unspecified: Secondary | ICD-10-CM

## 2023-07-03 DIAGNOSIS — E782 Mixed hyperlipidemia: Secondary | ICD-10-CM | POA: Diagnosis not present

## 2023-07-03 NOTE — Patient Instructions (Addendum)
Neuropsychological evaluation Hold off on starting primidone I have no objection to starting mirtazapine to treat appetite if needed Follow up after testing. You have been referred for a neurocognitive evaluation (i.e., evaluation of memory and thinking abilities). Please bring someone with you to this appointment if possible, as it is helpful for the neuropsychologist to hear from both you and another adult who knows you well. Please bring eyeglasses and hearing aids if you wear them and take any medications as you normally would. Please fully abstain from all alcohol, marijuana, or other substances prior to your appointment.   The evaluation will take approximately 2-3 hours and has two parts:   The first part is a clinical interview with the neuropsychologist, Dr. Milbert Coulter or Dr. Robbie Lis. During the interview, the neuropsychologist will speak with you and the individual you brought to the appointment.    The second part of the evaluation is testing with the doctor's technician, aka psychometrician, Annabelle Harman or [employer]. During the testing, the technician will ask you to remember different types of material, solve problems, and answer some questionnaires. Your family member will not be present for this portion of the evaluation.   Please note: We have to reserve several hours of the neuropsychologist's time and the psychometrician's time for your evaluation appointment. As such, there is a No-Show fee of $100. If you are unable to attend any of your appointments, please contact our office as soon as possible to reschedule.

## 2023-07-07 ENCOUNTER — Ambulatory Visit: Payer: PPO | Admitting: Internal Medicine

## 2023-07-13 ENCOUNTER — Encounter: Payer: Self-pay | Admitting: Hematology

## 2023-07-14 ENCOUNTER — Telehealth: Payer: Self-pay | Admitting: Neurology

## 2023-07-14 ENCOUNTER — Ambulatory Visit: Payer: HMO | Admitting: Psychology

## 2023-07-14 ENCOUNTER — Encounter: Payer: Self-pay | Admitting: Psychology

## 2023-07-14 ENCOUNTER — Ambulatory Visit: Payer: PPO | Admitting: Gastroenterology

## 2023-07-14 DIAGNOSIS — Z0279 Encounter for issue of other medical certificate: Secondary | ICD-10-CM

## 2023-07-14 DIAGNOSIS — F03A18 Unspecified dementia, mild, with other behavioral disturbance: Secondary | ICD-10-CM | POA: Diagnosis not present

## 2023-07-14 DIAGNOSIS — R4189 Other symptoms and signs involving cognitive functions and awareness: Secondary | ICD-10-CM

## 2023-07-14 DIAGNOSIS — R413 Other amnesia: Secondary | ICD-10-CM

## 2023-07-14 NOTE — Progress Notes (Signed)
 NEUROPSYCHOLOGICAL EVALUATION [place]. [facility]  [place] Department of Neurology  Date of Evaluation: 07/14/2023  REASON FOR REFERRAL   Nancy Liu is an 83 year old, right-handed, Black female with 12 years of formal education. She was referred for neuropsychological evaluation by her neurologist, Shon Millet, D.O., to assess current neurocognitive functioning, document potential cognitive deficits, and assist with treatment planning. This is her first neuropsychological evaluation.  SUMMARY OF RESULTS   Premorbid cognitive abilities are estimated to be in the below average range based on word reading ability as well as educational and occupational attainments. Given this estimate, the patient demonstrated difficulties in memory, verbal abilities (verbal fluency, confrontation naming), visuospatial abilities (figure copying, line orientation, clock drawing), and processing speed. Memory performance was characterized by low learning, without benefit from repetition, and rapid forgetting across all memory measures. When given recognition cues on one of the memory tasks, she did not appear to benefit significantly. Executive functioning was variable; performance was low when copying a complex figure or attempting to complete a task of divided attention and set shifting but intact on measures of abstract reasoning and judgment. Orientation was also variable, but it is unclear if this was related to test anxiety. Attention was broadly intact. On self-report mood questionnaires, she endorsed mild levels of depression and anxiety.  DIAGNOSTIC IMPRESSION   Findings of widespread cognitive impairment (memory, language, visuospatial functioning, processing speed, and aspects of executive functioning) in the setting of reduced functional independence are consistent with a diagnosis of mild dementia. Given pertinent background information, the pattern of findings does not necessarily  implicate a clear etiology at this time. Considering the patient was just recently discharged from the ED (07/01/2023) with acute metabolic encephalopathy, it is important to rule out the possibility of persistent unresolved symptoms affecting cognitive functioning. If after further evaluation and medical stability the patient's cognition does not improve, the possibility of an ongoing neurodegenerative disease process should be considered. Additional neurological workup, including a DaTscan and/or a sleep study to assess possible RBD, will be beneficial in helping to rule in or out the possibility of Parkinson's disease. Serial neuropsychological assessment will also be beneficial in further elucidating etiological contributors, especially as impairments in memory, language, and visuospatial abilities raise concern for temporal and parietal lobe dysfunction.  ICD-10 Codes: F03.A18 Mild dementia; R41.3 Memory loss  RECOMMENDATIONS   In consultation with your doctor, schedule cognitive reevaluation on an as-needed basis to assess for cognitive decline and update treatment recommendations. Reevaluation should occur during a period of medical and affective stability.  Discuss with your neurologist the risks and benefits of starting a medication that can help slow memory decline.  Patient should continue to refrain from driving at this time.  Continue to have a trusted family member assist with management of medications, finances, and medical appointments, to ensure that errors do not occur.  Patient was encouraged to establish legal documents designating advanced healthcare directives and durable powers of attorney. In times of crisis or further cognitive decline, having these documents in place can reduce the risk for financial victimization or mistakes and can ease potential family conflict related to finances or health care decisions.   Monitor mood given history of depression and reports of  frustration and anxiety with cognitive changes and tremor. If you feel that depression or anxiety is interfering with daily functioning, discuss medication options with your neurologist.  Continue managing vascular risk factors through a heart healthy diet, physician-approved physical activity, and medication adherence.  Participate  in enjoyable, stimulating activities outside the home, such as regular exercise and social activities. Remaining socially active and maintaining a structured schedule with opportunity for exercise can improve mood, sleep, and cognition.  Consider updated vision examination due to reports of decreased vision.  Patient should consider implementing compensatory strategies to maximize independence and maintain daily functioning. Examples include:  -Adhere to routine. Compensatory strategies work best when they are used consistently. Use a planner, calendar, or white board that has the schedule and important events for the day clearly listed to reference and cross off when tasks are complete.  -Ask for written information, especially if it is new or unfamiliar (e.g., information provided at a doctor's appointment).  -Create an organized environment. Keep items that can be easily misplaced in a sensible location and get into the habit of always returning the items to those places.  -Pay attention and reduce distractions. Make a point of focusing attention on information you want to remember. One-on-one interaction is more likely to facilitate attention and minimize distraction. Make eye contact and repeat the information out loud after you hear it. Reduce interruptions or distractions especially when attempting to learn new information.  -Create associations. When learning something new, think about and understand the information. Explain it in your own words or try to associate it with something you already know. Take notes to help remember important details. -Evaluate goals and  plan accordingly. When confronted by many different tasks, begin by making a list that prioritizes each task and estimates the time it will take to complete. Break down complicated tasks into smaller, more manageable steps.  -Focus on one task at a time and complete each task before starting another. Avoid multitasking.   DISPOSITION   The patient will follow up with the referring provider, Dr. Everlena Cooper. No follow-up neuropsychological testing was scheduled at this time. Please feel free to refer the patient for repeated evaluation if she shows a significant change in neurocognitive status. The patient and her family will be provided verbal feedback regarding the findings and impression during this visit in approximately one week.  The remainder of the report includes the details of the patient's background and a table of results from the current evaluation, which support the summary and recommendations described above.  BACKGROUND   History of Presenting Illness: The following information was obtained following a review of medical records and an interview with the patient and her three children [name], Jeannie Fend). Patient has been followed by a neurologist over the past two years for action tremor (primarily right hand) and mild bradykinesia. During her last appointment with Dr. Everlena Cooper on 07/03/2023, concern was raised regarding increased confusion and forgetfulness over the past few weeks. SLUMS = 9/30. Patient had recently presented to the Emergency Department for stroke evaluation on 06/30/2023, as she appeared to have left facial droop. Workup was negative for stroke and she was discharged the next day with acute metabolic encephalopathy.  Cognitive Functioning: During today's appointment, the patient's family endorsed ongoing fluctuating confusion, felt to be worse since her discharge from the hospital last month, and gradually progressive cognitive concerns over the past  five or so years. Patient corroborated their report. Her stated primary cognitive concerns included difficulty with short-term memory (e.g., names, numbers) and word finding. She denied comprehension difficulties. She further reported reduced attention and slowed processing speed. She was unsure of any difficulties with visuospatial abilities or executive functioning. Her children similarly reported difficulties with short-term memory (e.g., names,  details of conversations, events, increased time searching for misplaced items), word finding, attention (e.g., difficulty completing thoughts when speaking), and processing speed. They have additionally observed difficulties with spatial abilities/navigation, including the patient not recognizing familiar areas and occasionally not recognizing when she is home until she is inside. They noted that she does not do much planning, organizing, or problem-solving throughout her day.  Physical Functioning: Patient reported variable sleep and generally reduced energy levels. Her children have observed a tendency to be more restless at night if she had taken a nap earlier that day. They also noted instances of screaming and sleepwalking, typically on nights when she is more restless. For example, she has sleepwalked and opened the front door to people she heard outside who were not actually there. Visual hallucinations have also occurred during the daytime. It is not entirely clear if the hallucinations are well-formed, but she did report misperceiving objects and seeing shadows. Appetite is reduced and patient has lost significant weight; family is unsure if interest in food has decreased or if patient is forgetting to eat. Patient denied dysphagia. No changes to hearing, taste, or smell were reported. Patient endorsed relatively reduced vision. As mentioned previously, she has a tremor that is most notable in her dominant right hand. She feels her balance is unstable and has  tripped a few times (e.g., on her bed skirt), but no major falls were reported.  Emotional Functioning: Patient reported her current mood as "even." She noted that she is generally happy to be alive and hopeful for her future, but she does begin to feel anxious and frustrated in relation to cognitive decline and tremor. Her family also noted that since the pandemic and since undergoing treatment for breast cancer, the patient has been less socially engaged and spends the majority of her time at home.  Neuroimaging: MRI of the brain (10/03/2022 & 06/30/2023) documented mild chronic small vessel ischemic disease.  Other Medical History: Remarkable for hypertension, hyperlipidemia, type 2 diabetes, coronary artery disease s/p percutaneous coronary angioplasty, chronic kidney disease (stage III), GERD, and breast cancer with right total mastectomy. No history of stroke, CNS infection, head injury, or seizure was reported.  Current Medications: Per patient's daughter, aspirin, carvedilol, irbesartan-hydrochlorothiazide, metformin, rosuvastatin, and vitamin D3. She reported that patient is no longer taking amlodipine, lansoprazole, mirtazapine, and primidone.  Functional Status: Patient receives assistance with all instrumental activities of daily living. She quit driving approximately one month ago; while she denied any recent history of accidents or citations, she no longer felt comfortable driving. Her children recently took over the management of finances as a precaution. They also manage her medication and noted that prior to assuming responsibility, the patient had stopped taking her medications altogether. They also prepare her meals (she is able to use a microwave but she does not use the stove) and schedule her appointments. She is independent in all basic activities of daily living.  Family Neurological History: Remarkable for Alzheimer's disease (maternal grandmother).  Psychiatric History:  Remarkable for depression treated with medication. Per family, depression improved following successful treatment of cancer and patient discontinued psychotropic medication (approximately 3 weeks ago). History of suicidal ideation and psychiatric hospitalizations was not reported.  Social and Developmental History: Patient was born in [place], [place]. History of perinatal complications and developmental delays was not reported. Patient is divorced and has three children. She resides in her own home with her daughter.  Educational and Occupational History: While no history of childhood learning disability, special education  services, or grade retention was reported, patient noted that she struggled with reading and math and was a Civil engineer, contracting. 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 She was not sure when she retired but believes it was around the age of 12.   Substance Use History: Patient denied current use of alcohol, nicotine, marijuana, and illicit substances.  BEHAVIORAL OBSERVATIONS   Patient arrived on time and was accompanied by her three children. She ambulated independently. Gait was mildly slowed. Initially, she was alert and fully oriented with the exception of the day of the week (stated it was Friday). However, when asked orientation questions later during testing, she stated the date as 08/11/1922; it is unclear whether or not the patient was confused or simply anxious about testing. She was appropriately groomed and dressed for the setting. No significant sensory or motor abnormalities (despite known tremor) were noted on casual observation. Vision (corrected) and hearing were adequate for testing purposes. Speech was of normal rate, prosody, and volume. No conversational word-finding difficulties, paraphasic errors, or dysarthria were observed during the clinical interview; however, word finding difficulties  were observed during confrontation naming and generative fluency tasks during testing. Comprehension was conversationally intact. Thought processes were linear, logical, and coherent. She only appeared confused once during the clinical interview when she stated she was married, but after her children corrected her, she proceeded with providing accurate information. Thought content was organized and devoid of delusions. Insight appeared appropriate. Affect was even and congruent with mood. She was cooperative and gave adequate effort during testing, including on an embedded measure of performance validity. Results are thought to accurately reflect her cognitive functioning at this time.  NEUROPSYCHOLOGICAL TESTING RESULTS   Tests Administered: Clock Drawing; Controlled Oral Word Association Test (COWAT): FAS; Dementia Rating Scale-2 (DRS-2) - Standard Form; Geriatric Anxiety Scale-10 Item (GAS-10); Geriatric Depression Scale Short Form (GDS-S); Neuropsychological Assessment Battery (NAB) Form 1- Subtest(s): Naming, Judgement; Repeatable Battery for the Assessment of Neuropsychological Status Update (RBANS Update) - Form A; Test of Premorbid Functioning (TOPF); and Trail Making Test (TMT)  Test results are provided in the table below. Whenever possible, the patient's scores were compared against age-, sex-, and education-corrected normative samples. Interpretive descriptions are based on the AACN consensus conference statement on uniform labeling (Guilmette et al., 2020).  Cognitive Screening   Descriptor  DRS-2 Scaled Percentile     Total Score 8 19-28 Average  Attention 12 72-81 High Average  Initiation/Perseveration 6 6-10 Low Average  Construction 8 19-28 Average  Conceptualization 11 60-71 Average  Memory 4 2 Below Average  *From MOAANS          RBANS Form A Standard Percentile     Total Score 52 <1 Exceptionally Low  Immediate Memory 57 <1 Exceptionally Low  Visuospatial/Constructional 58 <1  Exceptionally Low  Language 60 <1 Exceptionally Low  Attention 82 12 Low Average  Delayed Memory 44 <1 Exceptionally Low       Intellectual Functioning      Standard Percentile   Test of Premorbid Functioning 78 7 Below Average       Memory     RBANS Form A Standard/Scaled Percentile     Immediate Memory 57 <1 Exceptionally Low  List Learning (4,3,2,3) 2 <1 Exceptionally Low  Story Memory (3,4) 4 2 Below Average    Delayed Memory 44 <1 Exceptionally Low  List Recall (0/10) --- <2 Exceptionally Low  List Recognition (  13/20) --- <2 Exceptionally Low  Story Recall (0/12) 1 <1 Exceptionally Low  Figure Recall (0/20) 1 <1 Exceptionally Low       Attention/Processing Speed     Trail Making Test (TMT) Raw (T) Percentile   Part A 88", 0e (35) 7 Below Average       RBANS Form A Standard/Scaled Percentile     Attention 82 12 Low Average  Digit Span 13 84 High average  Coding 1 <1 Exceptionally Low        Executive Functioning     Trail Making Test (TMT) Raw (T) Percentile   Part B D/C --- ---        T Percentile   NAB Judgement Form 1 56 73 Average       Language     Verbal Fluency Test Raw (T) Percentile   Phonemic Fluency (FAS) 6 (29) 2 Exceptionally Low  Animal Fluency 4 (29) 2 Exceptionally Low       RBANS Form A Standard/Scale Percentile     Language 60 <1 Exceptionally Low  Picture Naming (6/10) --- <2 Exceptionally Low  Semantic Fluency 4 2 Below Average        Raw (T) Percentile   NAB Naming Form 1 17/31 (21) <1 BNL       Visuospatial/Visuoconstruction      Raw Percentile   Clock Drawing 6/10 --- BNL       RBANS Form A Standard/Scale Percentile     Visuospatial/Constructional 58 <1 Exceptionally Low  Figure Copy 3 1 Exceptionally Low  Line Orientation --- <2 Exceptionally Low       Mood Questionnaires      Raw Percentile   GDS-SF 6 --- Mild  GAS-10 8 --- Mild   *Note: ss = scaled score; SS = standard score; T = t-score; C/S = corrected raw score; WNL =  within normal limits; BNL= below normal limits; D/C = discontinued. Scores from skewed distributions are typically interpreted as WNL (>=16th %ile) or BNL (<16th %ile).  INFORMED CONSENT   Patient was provided with a verbal description of the nature and purpose of the neuropsychological evaluation. Also reviewed were the foreseeable risks and/or discomforts and benefits of the procedure, limits of confidentiality, and mandatory reporting requirements of this provider. Patient was given the opportunity to have their questions answered. Oral consent to participate was provided by the patient.   This report was prepared as part of a clinical evaluation and is not intended for forensic use.  SERVICE   This evaluation was conducted by Annice Pih, Psy.D. In addition to time spent directly with the patient, total professional time includes record review, integration of relevant medical history, test selection, interpretation of findings, and report preparation. A technician, Wallace Keller, B.S., provided testing and scoring assistance for 105 minutes.  Psychiatric Diagnostic Evaluation Services (Professional): [id number] x 1 Neuropsychological Testing Evaluation Services (Professional): 30865 x 1 Neuropsychological Testing Evaluation Services (Professional): 78469 x 2 Neuropsychological Test Administration and Scoring (Technician): [phone] x 1 Neuropsychological Test Administration and Scoring (Technician): [phone] x 2  This report was generated using voice recognition software. While this document has been carefully reviewed, transcription errors may be present. I apologize in advance for any inconvenience. Please contact me if further clarification is needed.            Annice Pih, Psy.D.             Neuropsychologist

## 2023-07-14 NOTE — Progress Notes (Signed)
   Psychometrician Note   Cognitive testing was administered to Nancy Liu by Nancy Liu, B.S. (psychometrist) under the supervision of Dr. Annice Pih, Psy.D., licensed psychologist on 07/14/2023. Nancy Liu did not appear overtly distressed by the testing session per behavioral observation or responses across self-report questionnaires. Rest breaks were offered.    The battery of tests administered was selected by Dr. Annice Pih, Psy.D. with consideration to Nancy Liu's current level of functioning, the nature of her symptoms, emotional and behavioral responses during interview, level of literacy, observed level of motivation/effort, and the nature of the referral question. This battery was communicated to the psychometrist. Communication between Dr. Annice Pih, Psy.D. and the psychometrist was ongoing throughout the evaluation and Dr. Annice Pih, Psy.D. was immediately accessible at all times. Dr. Annice Pih, Psy.D. provided supervision to the psychometrist on the date of this service to the extent necessary to assure the quality of all services provided.    Nancy Liu will return within approximately 1-2 weeks for an interactive feedback session with Nancy Liu at which time her test performances, clinical impressions, and treatment recommendations will be reviewed in detail. Nancy Liu understands she can contact our office should she require our assistance before this time.  A total of 105 minutes of billable time were spent face-to-face with Nancy Liu by the psychometrist. This includes both test administration and scoring time. Billing for these services is reflected in the clinical report generated by Dr. Annice Pih, Psy.D.  This note reflects time spent with the psychometrician and does not include test scores or any clinical interpretations made by Nancy Liu. The full report will follow in a separate note.

## 2023-07-14 NOTE — Telephone Encounter (Signed)
Pt's daughter dropped off FMLA paperwork for herself to help take care of the patient. She would like to pick up the forms once complete. She has paid her $25 form fee. Forms are in Dr. Moises Blood box

## 2023-07-15 NOTE — Telephone Encounter (Signed)
On Dr.Jaffe desk.

## 2023-07-22 ENCOUNTER — Ambulatory Visit (INDEPENDENT_AMBULATORY_CARE_PROVIDER_SITE_OTHER): Payer: HMO | Admitting: Psychology

## 2023-07-22 DIAGNOSIS — R413 Other amnesia: Secondary | ICD-10-CM

## 2023-07-22 DIAGNOSIS — F03A18 Unspecified dementia, mild, with other behavioral disturbance: Secondary | ICD-10-CM | POA: Diagnosis not present

## 2023-07-22 NOTE — Progress Notes (Signed)
   NEUROPSYCHOLOGY FEEDBACK SESSION [place]. [facility]  [place] Department of Neurology  Date of Feedback Session: 07/22/2023  REASON FOR REFERRAL   Nancy Liu is an 83 year old, right-handed, Black female with 12 years of formal education. She was referred for neuropsychological evaluation by her neurologist, Shon Millet, D.O., to assess current neurocognitive functioning, document potential cognitive deficits, and assist with treatment planning.   FEEDBACK   Patient completed a comprehensive neuropsychological evaluation on 07/14/2023. Please refer to that encounter for the full report and recommendations. Briefly, results suggested widespread cognitive impairment (memory, language, visuospatial functioning, processing speed, and aspects of executive functioning) in the setting of reduced functional independence, consistent with a diagnosis of mild dementia. Further workup will be helpful in clarifying the underlying cause.  Today, the patient was accompanied by two of her children, Nancy Liu and Nancy Liu. They were provided verbal feedback regarding the findings and impression during this visit, and their questions were answered.  DISPOSITION   The patient will follow up with the referring provider, Dr. Everlena Cooper. No follow-up neuropsychological testing was scheduled at this time. Cognitive reevaluation may be scheduled on as-needed basis to assess for cognitive decline and update treatment recommendations. Reevaluation should occur during a period of medical and affective stability.   SERVICE   This feedback session was conducted by Annice Pih, Psy.D. [place] unit of [id number] was billed for Dr. Robbie Lis' time spent in preparing, conducting, and documenting the current feedback session.  This report was generated using voice recognition software. While this document has been carefully reviewed, transcription errors may be present. I apologize in advance for any inconvenience. Please  contact me if further clarification is needed.

## 2023-07-23 ENCOUNTER — Encounter: Payer: Self-pay | Admitting: Hematology
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
## 2023-07-26 NOTE — Progress Notes (Unsigned)
 Nancy Liu, female    DOB: 15-Jan-1941    MRN: [id number]   Brief patient profile:  77 yobf quit smoking 1989   self referred to pulmonary clinic in [place]  07/03/2022  for refractory cough flared summer 2023 but onset per record was really 2008   Has seen Dr Craige Cotta with working dx of bronchiectasis by HRCT  10/20/19    History of Present Illness  07/03/2022  Pulmonary/ 1st office eval/ Celsa Nordahl / [place] Office  Chief Complaint  Patient presents with   Acute Visit    Ongoing "bad cough"  Patient states no improvement and lately the cough causes her nose to run. Neg covid test   Dyspnea:  also onset with cough/ esp steps now much more difficult  Cough: urge to clear throat during the day / worse immediately at hs/ assoc with watery pnds no better on H1/ assoc hoarseness and throat discomfort  Sleep: nightly  SABA use: don't help 02: none  Rec Continue prevacid 30 mg Take 30- 60 min before your first and last meals of the day  GERD diet reviewed, bed blocks rec  For drainage / throat tickle try take CHLORPHENIRAMINE  4 mg   Depomerdrol 120 mg IM today  For cough > mucinex dm 1200 mg every 12 hours as needed  Return to this clinic if not better with all medications with you in 2 weeks     03/09/2023  f/u ov/[place] office/Halen Mossbarger re: cough x 2008 flare x 1 weeks dry maint on prevacid 30mg   bid  / no longer using 1st gen H1 blockers per guidelines   Chief Complaint  Patient presents with   Cough    X1week, denies fevers/chills/shob  Dyspnea:  about 10 min fast pace flat surface  Cough: after supper tends to flare but quiets down, then wakes her up 6 am "hard coughing" > min mucoid assoc with midline cp during severe coughing fits  Sleeping: bed is flat/ sev pillows  s resp cc  SABA use: none  02: none Rec Prevacid 30 mg Take 30- 60 min before your first and last meals of the day  GERD diet reviewed, bed blocks rec  Take delsym (over the counter)  two tsp every 12 hours  and supplement if needed with tessalon 200 mg up to three times daily  t Once you have eliminated the cough for 3 straight days try reducing the tessalon,  then the delsym as tolerated.   For  throat tickle and evening cough  try take CHLORPHENIRAMINE  4 mg   Depomedrol 120 mg IM  Cxr ok  Rec       07/28/2023  f/u ov/[place] office/Rithwik Schmieg re: cough x 2008  maint on lansoprazole 30 mg daily  did  bring meds but not consistent with  recs above  No chief complaint on file. Dyspnea:  10 min fast pace  Cough: cough worse p supper (not taking ppi ac)  Sleeping: better with wedge pillows    SABA use: none  02: none    No obvious day to day or daytime variability or assoc excess/ purulent sputum or mucus plugs or hemoptysis or cp or chest tightness, subjective wheeze or overt sinus or hb symptoms.    Also denies any obvious fluctuation of symptoms with weather or environmental changes or other aggravating or alleviating factors except as outlined above   No unusual exposure hx or h/o childhood pna/ asthma or knowledge of premature birth.  Current Allergies,  Complete Past Medical History, Past Surgical History, Family History, and Social History were reviewed in [employer] record.  ROS  The following are not active complaints unless bolded Hoarseness, sore throat, dysphagia, dental problems, itching, sneezing,  nasal congestion or discharge of excess mucus or purulent secretions, ear ache,   fever, chills, sweats, unintended wt loss or wt gain, classically pleuritic or exertional cp,  orthopnea pnd or arm/hand swelling  or leg swelling, presyncope, palpitations, abdominal pain, anorexia, nausea, vomiting, diarrhea  or change in bowel habits or change in bladder habits, change in stools or change in urine, dysuria, hematuria,  rash, arthralgias, visual complaints, headache, numbness, weakness or ataxia or problems with walking or coordination,  change in mood or  memory.  Tremor  x 6 months         No outpatient medications have been marked as taking for the 07/28/23 encounter (Appointment) with [name], MD.                  Past Medical History:  Diagnosis Date   Arteriosclerotic cardiovascular disease (ASCVD) 2001   Bare-metal stent placed in the right coronary artery in 12/01; residual 50% lesion of the first diagonal and mid LAD   Breast cancer [facility])    Cerebrovascular disease    COPD (chronic obstructive pulmonary disease) (HCC)    Cyst, dermoid, arm, left 03/26/2018   Diabetes mellitus    excellent control with a low-dose of a single oral agent   Family history of breast cancer 01/23/2021   Family history of ovarian cancer 01/23/2021   GERD (gastroesophageal reflux disease)    with ulcers   History of right coronary artery stent placement 2000   Hyperlipidemia    Hypertension    Sleep apnea    Thalassemia minor    Tobacco abuse, in remission    35 pack years; Quit in 1980       Objective:    Wts  07/28/2023         ***  07/28/2023        147   03/09/2023       145  02/09/2023         151  08/25/2022       144   07/17/22 142 lb 6.4 oz (64.6 kg)  07/16/22 144 lb (65.3 kg)  07/03/22 146 lb 6.4 oz (66.4 kg)     urge to  cough on insp or exp maneuvers***        Assessment

## 2023-07-27 ENCOUNTER — Ambulatory Visit: Payer: HMO | Admitting: Gastroenterology

## 2023-07-27 ENCOUNTER — Encounter: Payer: Self-pay | Admitting: Gastroenterology

## 2023-07-27 VITALS — BP 107/69 | HR 79 | Temp 97.4°F | Ht 62.5 in | Wt 135.4 lb

## 2023-07-27 DIAGNOSIS — R634 Abnormal weight loss: Secondary | ICD-10-CM | POA: Diagnosis not present

## 2023-07-27 DIAGNOSIS — K219 Gastro-esophageal reflux disease without esophagitis: Secondary | ICD-10-CM | POA: Diagnosis not present

## 2023-07-27 DIAGNOSIS — D500 Iron deficiency anemia secondary to blood loss (chronic): Secondary | ICD-10-CM

## 2023-07-27 DIAGNOSIS — Z8711 Personal history of peptic ulcer disease: Secondary | ICD-10-CM | POA: Diagnosis not present

## 2023-07-27 DIAGNOSIS — K5900 Constipation, unspecified: Secondary | ICD-10-CM

## 2023-07-27 DIAGNOSIS — K5520 Angiodysplasia of colon without hemorrhage: Secondary | ICD-10-CM

## 2023-07-27 DIAGNOSIS — K31819 Angiodysplasia of stomach and duodenum without bleeding: Secondary | ICD-10-CM

## 2023-07-27 DIAGNOSIS — D509 Iron deficiency anemia, unspecified: Secondary | ICD-10-CM

## 2023-07-27 MED ORDER — FERROUS SULFATE 325 (65 FE) MG PO TBEC: DELAYED_RELEASE_TABLET | ORAL | Status: DC

## 2023-07-27 NOTE — Patient Instructions (Addendum)
 Continue to monitor your weight. If you are not maintaining your weight, we should consider CT scan to evaluate weight loss.  For acid reflux, you can use TUMs as needed. For constipation, use miralax 1/2-1 capful daily to maintain regular stools. If stools get loose, then drop back to every other day.  Use over the counter iron (ferrous sulfate) 325mg  three days a week.  Return to the office in six months or sooner if needed.

## 2023-07-28 ENCOUNTER — Ambulatory Visit: Payer: HMO | Admitting: Internal Medicine

## 2023-07-28 ENCOUNTER — Encounter: Payer: Self-pay | Admitting: Internal Medicine

## 2023-07-28 VITALS — BP 117/70 | HR 75 | Ht 62.5 in | Wt 134.4 lb

## 2023-07-28 DIAGNOSIS — R053 Chronic cough: Secondary | ICD-10-CM

## 2023-07-28 NOTE — Patient Instructions (Addendum)
 Continue the prevacid over the counter Take 30- 60 min before your first and last meals of the day    If you are satisfied with your treatment plan,  let your doctor know and he/she can either refill your medications or you can return here when your prescription runs out.     If in any way you are not 100% satisfied,  please tell us.  If 100% better, tell your friends!  Pulmonary follow up is as needed

## 2023-07-28 NOTE — Assessment & Plan Note (Signed)
 Onset 2008/worse since summer 2023 with bronchiectasis by   Chest  HRCT  10/20/19  - 07/03/2022 try max rx for gerd and pnds with 1st gen H1 blockers per guidelines   - Allergy screen 07/03/2022 >  Eos 0.3 /  IgE  29 - Cycilcal cough regimen 07/17/2022 >>> resolved as of 08/25/2022 > continue gerd rx  - MRI sinus May 3 /2024 No significant paranasal sinus disease.   - 03/09/2023 bed blocks and Delysm/ tesslon prn / depomedrol 120 mg and 1st gen H1 blockers per guidelines  >, much improved 07/28/2023   Finally resolved to her satisfaction so rec continue otc ppi bid ac  for now and defer to PCP consider conversion to pepcid 20 mg bid pc after 3 more months of cough control on present rx  Pulmonary f/u can be prn          Each maintenance medication was reviewed in detail including emphasizing most importantly the difference between maintenance and prns and under what circumstances the prns are to be triggered using an action plan format where appropriate.  Total time for H and P, chart review, counseling,   and generating customized AVS unique to this office visit / same day charting = 32 min summary final f/u ov

## 2023-07-29 NOTE — Telephone Encounter (Signed)
 Was the [facility] signed and completed and if so, where is it located?

## 2023-08-10 NOTE — Progress Notes (Unsigned)
 Virtual Visit via Video Note  Consent was obtained for video visit:  Yes.   Answered questions that patient had about telehealth interaction:  Yes.   I discussed the limitations, risks, security and privacy concerns of performing an evaluation and management service by telemedicine. I also discussed with the patient that there may be a patient responsible charge related to this service. The patient expressed understanding and agreed to proceed.  Pt location: Home Physician Location: office Name of referring provider:  Renaye Rakers, MD I connected with Marty Heck at patients initiation/request on 08/11/2023 at 12:50 PM EDT by video enabled telemedicine application and verified that I am speaking with the correct person using two identifiers. Pt MRN:  [id number] Pt DOB:  1941/01/11 Video Participants:  Marty Heck;  her children   Assessment/Plan:   Major neurocognitive disorder of unclear etiology Tremor.      Will go ahead and start donepezil, titrating to 10mg  at bedtime To help with a more definitive diagnosis, will order DaT scan to evaluate for possible Parkinson's disease, as she does have accompanying tremor as well. Would repeat neuropsychological evaluation in one year Follow up in 6 months.  Total time spent in chart and face to face with patient and family:  45 minutes  Subjective:  Nancy Liu is an 83 year old right-handed female with CAD, HTN, HLD, DM II, cerebrovascular disease, Thalassemia minor whom I see for tremor presents for recent hospitalization for altered mental status/TIA.  History obtained by hospital records.  Accompanied by her daughter and son who supplements history.  MRIs of brain and CTA personally reviewed.  UPDATE: She underwent neuropsychological evaluation on 2/11, which demonstrated cognitive impairment of multiple domains including memory, language, visuospatial functioning, processing speed, and aspects of executive  functioning meeting diagnosis of mild dementia but without clear etiology.  Findings may be complicated by residual metabolic encephalopathy.      HISTORY: Symptoms started when Covid pandemic happened.  Increased depression.  Nightmares.  Decreased appetite and lost weight.  She reports problems with balance, meaning she stumbles and feels lightheaded.  Being out in the heat and sun aggravates it .  When she is eating sometimes, her hand is shaking if she is holding utensils.  She may notice it in the left hand too. Needs to hold it with her other hand to stop it.  When sitting, her legs may start shaking.  She also reports cramps in feet.  MRI of brain 10/03/2022 showed mild chronic small vessel ischemic changes.  No family history of tremor.    In January 2025, she had become increasingly confused and forgetful.  She would frequently misplace objects, confuse names of items but would also not know where she was, even at home.  She was also not taking her medications correctly.  On 06/30/2023, she appeared to have some facial asymmetry, so she was admitted to [facility].  Because family endorsed facial asymmetry, she was a stroke code.  NIH stroke scale was 0.  CT head was negative.  CTA head and neck showed mild stenosis in the proximal right M2 and P1-P2 junction, and right vertebral artery not visualized from its origin through distal V2 segment where it likely reconstitutes via collaterals and diminutive right V3 segment but no emergent LVO.  MRI of brain again showed mild chronic small vessel ischemic changes in the cerebral white matter, but no acute findings.  EEG revealed moderate diffuse slowing but no seizures or epileptiform  discharges.  Labs revealed slight worsening of kidney function (Cr 1.21 and GFR 45).  Otherwise, labs largely normal/unremarkable, including CBC, CMP, TSH, B12, ammonia and UDS.  Due to worsening tremor, she was prescribed primidone but she has not picked it up yet.  For  the past 3 months, she has been losing more weight due to poor appetite.  She may be agitated when she has difficulty explaining things but she isn't combative.  She does experience hallucinations. Sometimes she sees shadows or she may hear children outside the window who aren't there.  She has been able to perform ADLs such as bathing, dressing and using the toilet independently.    Past Medical History: Past Medical History:  Diagnosis Date   Arteriosclerotic cardiovascular disease (ASCVD) 2001   Bare-metal stent placed in the right coronary artery in 12/01; residual 50% lesion of the first diagonal and mid LAD   Breast cancer (HCC)    Cerebrovascular disease    COPD (chronic obstructive pulmonary disease) (HCC)    Cyst, dermoid, arm, left 03/26/2018   Diabetes mellitus    excellent control with a low-dose of a single oral agent   Family history of breast cancer 01/23/2021   Family history of ovarian cancer 01/23/2021   GERD (gastroesophageal reflux disease)    with ulcers   History of right coronary artery stent placement 2000   Hyperlipidemia    Hypertension    Sleep apnea    Thalassemia minor    Tobacco abuse, in remission    35 pack years; Quit in 1980    Medications: Outpatient Encounter Medications as of 08/11/2023  Medication Sig   amLODipine (NORVASC) 10 MG tablet TAKE ONE TABLET BY MOUTH EVERYDAY AT BEDTIME   aspirin EC 81 MG tablet Take 81 mg by mouth daily. Swallow whole.   Blood Glucose Monitoring Suppl (ONE TOUCH ULTRA 2) w/Device KIT SMARTSIG:Via Meter   carvedilol (COREG) 6.25 MG tablet TAKE ONE TABLET BY MOUTH BEFORE BREAKFAST and TAKE ONE TABLET BY MOUTH EVERYDAY AT BEDTIME   cholecalciferol (VITAMIN D3) 25 MCG (1000 UNIT) tablet Take 1,000 Units by mouth daily.   Cholecalciferol (VITAMIN D3) 50 MCG (2000 UT) TABS TAKE ONE TABLET BY MOUTH daily in addition TO THE 1,000unit FOR total of 3,000units daily   ferrous sulfate 325 (65 FE) MG EC tablet Take one tablet  three times a week   irbesartan-hydrochlorothiazide (AVALIDE) 300-12.5 MG tablet Take 1 tablet by mouth daily before breakfast.   metFORMIN (GLUCOPHAGE) 500 MG tablet Take 500 mg by mouth at bedtime.   ONETOUCH ULTRA test strip    polyethylene glycol powder (GLYCOLAX/MIRALAX) 17 GM/SCOOP powder Take 1/2 capful daily as needed. Make sure to take a dose if you go more than 48 hours without a bowel movement.   rosuvastatin (CRESTOR) 20 MG tablet TAKE ONE TABLET BY MOUTH EVERYDAY AT BEDTIME   triamcinolone ointment (KENALOG) 0.5 % Apply 1 application  topically 2 (two) times daily as needed (rash).   No facility-administered encounter medications on file as of 08/11/2023.    Allergies: Allergies  Allergen Reactions   Protonix [Pantoprazole Sodium]     DRY LIPS    Family History: Family History  Problem Relation Age of Onset   Heart disease Mother        also hypertension and asthma   Lung cancer Mother        dx 6s   Ovarian cancer Mother        dx before 2  Alzheimer's disease Maternal Grandmother    Diabetes Half-Sister    Breast cancer Half-Sister 10   Multiple myeloma Nephew 53       war-related exposures   Colon cancer Neg Hx    Colon polyps Neg Hx     Observations/Objective:   No acute distress.  Alert and oriented.  Speech fluent and not dysarthric.  Language intact.     Follow Up Instructions:    -I discussed the assessment and treatment plan with the patient. The patient was provided an opportunity to ask questions and all were answered. The patient agreed with the plan and demonstrated an understanding of the instructions.   The patient was advised to call back or seek an in-person evaluation if the symptoms worsen or if the condition fails to improve as anticipated.   Cira Servant, DO   CC: Renaye Rakers, MD

## 2023-08-11 ENCOUNTER — Inpatient Hospital Stay: Payer: PPO | Attending: Hematology

## 2023-08-11 ENCOUNTER — Telehealth (INDEPENDENT_AMBULATORY_CARE_PROVIDER_SITE_OTHER): Payer: PPO | Admitting: Neurology

## 2023-08-11 ENCOUNTER — Encounter: Payer: Self-pay | Admitting: Neurology

## 2023-08-11 DIAGNOSIS — F32A Depression, unspecified: Secondary | ICD-10-CM | POA: Insufficient documentation

## 2023-08-11 DIAGNOSIS — E559 Vitamin D deficiency, unspecified: Secondary | ICD-10-CM | POA: Insufficient documentation

## 2023-08-11 DIAGNOSIS — F0390 Unspecified dementia without behavioral disturbance: Secondary | ICD-10-CM

## 2023-08-11 DIAGNOSIS — Z87891 Personal history of nicotine dependence: Secondary | ICD-10-CM | POA: Insufficient documentation

## 2023-08-11 DIAGNOSIS — N6091 Unspecified benign mammary dysplasia of right breast: Secondary | ICD-10-CM

## 2023-08-11 DIAGNOSIS — D500 Iron deficiency anemia secondary to blood loss (chronic): Secondary | ICD-10-CM

## 2023-08-11 DIAGNOSIS — D0511 Intraductal carcinoma in situ of right breast: Secondary | ICD-10-CM | POA: Insufficient documentation

## 2023-08-11 DIAGNOSIS — M85851 Other specified disorders of bone density and structure, right thigh: Secondary | ICD-10-CM | POA: Insufficient documentation

## 2023-08-11 DIAGNOSIS — G3184 Mild cognitive impairment, so stated: Secondary | ICD-10-CM

## 2023-08-11 DIAGNOSIS — R251 Tremor, unspecified: Secondary | ICD-10-CM

## 2023-08-11 DIAGNOSIS — Z9011 Acquired absence of right breast and nipple: Secondary | ICD-10-CM | POA: Diagnosis not present

## 2023-08-11 DIAGNOSIS — D509 Iron deficiency anemia, unspecified: Secondary | ICD-10-CM | POA: Diagnosis not present

## 2023-08-11 LAB — IRON AND TIBC
Iron: 53 ug/dL (ref 28–170)
Saturation Ratios: 16 % (ref 10.4–31.8)
TIBC: 327 ug/dL (ref 250–450)
UIBC: 274 ug/dL

## 2023-08-11 LAB — CBC WITH DIFFERENTIAL/PLATELET
Abs Immature Granulocytes: 0.04 10*3/uL (ref 0.00–0.07)
Basophils Absolute: 0 10*3/uL (ref 0.0–0.1)
Basophils Relative: 0 %
Eosinophils Absolute: 0.1 10*3/uL (ref 0.0–0.5)
Eosinophils Relative: 1 %
HCT: 37.7 % (ref 36.0–46.0)
Hemoglobin: 11.6 g/dL — ABNORMAL LOW (ref 12.0–15.0)
Immature Granulocytes: 0 %
Lymphocytes Relative: 13 %
Lymphs Abs: 1.2 10*3/uL (ref 0.7–4.0)
MCH: 24.3 pg — ABNORMAL LOW (ref 26.0–34.0)
MCHC: 30.8 g/dL (ref 30.0–36.0)
MCV: 79 fL — ABNORMAL LOW (ref 80.0–100.0)
Monocytes Absolute: 0.9 10*3/uL (ref 0.1–1.0)
Monocytes Relative: 10 %
Neutro Abs: 7.1 10*3/uL (ref 1.7–7.7)
Neutrophils Relative %: 76 %
Platelets: 341 10*3/uL (ref 150–400)
RBC: 4.77 MIL/uL (ref 3.87–5.11)
RDW: 15.9 % — ABNORMAL HIGH (ref 11.5–15.5)
WBC: 9.4 10*3/uL (ref 4.0–10.5)
nRBC: 0 % (ref 0.0–0.2)

## 2023-08-11 LAB — COMPREHENSIVE METABOLIC PANEL
ALT: 20 U/L (ref 0–44)
AST: 27 U/L (ref 15–41)
Albumin: 3.8 g/dL (ref 3.5–5.0)
Alkaline Phosphatase: 50 U/L (ref 38–126)
Anion gap: 12 (ref 5–15)
BUN: 25 mg/dL — ABNORMAL HIGH (ref 8–23)
CO2: 28 mmol/L (ref 22–32)
Calcium: 9.5 mg/dL (ref 8.9–10.3)
Chloride: 98 mmol/L (ref 98–111)
Creatinine, Ser: 1.23 mg/dL — ABNORMAL HIGH (ref 0.44–1.00)
GFR, Estimated: 44 mL/min — ABNORMAL LOW (ref >60)
Glucose, Bld: 95 mg/dL (ref 70–99)
Potassium: 4.3 mmol/L (ref 3.5–5.1)
Sodium: 138 mmol/L (ref 135–145)
Total Bilirubin: 0.5 mg/dL (ref 0.0–1.2)
Total Protein: 6.9 g/dL (ref 6.5–8.1)

## 2023-08-11 LAB — FERRITIN: Ferritin: 134 ng/mL (ref 11–307)

## 2023-08-11 MED ORDER — DONEPEZIL HCL 5 MG PO TABS: 5.0000 mg | ORAL_TABLET | Freq: Every day | ORAL | 0 refills | Status: DC

## 2023-08-14 DIAGNOSIS — Z9011 Acquired absence of right breast and nipple: Secondary | ICD-10-CM | POA: Diagnosis not present

## 2023-08-14 DIAGNOSIS — D0511 Intraductal carcinoma in situ of right breast: Secondary | ICD-10-CM | POA: Diagnosis not present

## 2023-08-17 ENCOUNTER — Other Ambulatory Visit (HOSPITAL_COMMUNITY): Payer: Self-pay | Admitting: Neurology

## 2023-08-17 ENCOUNTER — Encounter (HOSPITAL_COMMUNITY): Payer: Self-pay | Admitting: Neurology

## 2023-08-17 DIAGNOSIS — R251 Tremor, unspecified: Secondary | ICD-10-CM
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
## 2023-08-18 ENCOUNTER — Telehealth: Payer: Self-pay

## 2023-08-18 ENCOUNTER — Inpatient Hospital Stay: Payer: PPO | Admitting: Physician Assistant

## 2023-08-18 VITALS — BP 125/76 | HR 79 | Temp 98.3°F | Resp 19 | Ht 62.5 in | Wt 132.8 lb

## 2023-08-18 DIAGNOSIS — D500 Iron deficiency anemia secondary to blood loss (chronic): Secondary | ICD-10-CM

## 2023-08-18 DIAGNOSIS — D0511 Intraductal carcinoma in situ of right breast: Secondary | ICD-10-CM | POA: Diagnosis not present

## 2023-08-18 DIAGNOSIS — N6091 Unspecified benign mammary dysplasia of right breast: Secondary | ICD-10-CM

## 2023-08-18 DIAGNOSIS — R634 Abnormal weight loss: Secondary | ICD-10-CM | POA: Diagnosis not present

## 2023-08-18 DIAGNOSIS — R251 Tremor, unspecified: Secondary | ICD-10-CM

## 2023-08-18 NOTE — Patient Instructions (Addendum)
 [facility] at [facility] **VISIT SUMMARY & IMPORTANT INSTRUCTIONS **   You were seen today by Rojelio Brenner PA-C for your follow-up visit.    HISTORY OF BREAST CANCER You did not have any evidence of returning breast cancer based on your labs and physical exam. Your breast MRI from December 2024 did not show any signs of cancer. Your next mammogram is due in April 2025. Will recheck breast exam every 6 months (next due September 2025)  IRON DEFICIENCY Continue taking iron tablet (ferrous sulfate 325 mg) daily.  VITAMIN D DEFICIENCY Your vitamin D level looks great! Continue taking vitamin D3 1000 units daily.  WEIGHT LOSS & LOW APPETITE Your weight has dropped about 15 pounds in the past 6 months (including 5 pounds in the past 2 months). Make sure you are eating 3 full sized meals daily.  You should also supplement your diet with 2-3 Ensure daily. We will see you for weight check in 1 month.  If you have continued to lose weight, we will check a CT scan.  OTHER CONCERNS DEPRESSION: Continue to follow-up with your primary care doctor for management of your antidepressant medication.  Your primary care doctor can also help you to find a therapist or counselor. TREMOR: Continue to follow-up with your neurologist (Dr. Everlena Cooper).  You may be able to take medication to help your tremor in the future.  FOLLOW-UP APPOINTMENTS: - Office visit in 1 month for weight check - Screening mammogram of left breast around the end of April 2025  ** Thank you for trusting me with your healthcare!  I strive to provide all of my patients with quality care at each visit.  If you receive a survey for this visit, I would be so grateful to you for taking the time to provide feedback.  Thank you in advance!  ~ Kerrilynn Derenzo                   Dr. Doreatha Massed   &   Rojelio Brenner, PA-C   - - - - - - - - - - - - - - - - - -    Thank you for choosing [facility]  at [facility] to provide your oncology and hematology care.  To afford each patient quality time with our provider, please arrive at least 15 minutes before your scheduled appointment time.   If you have a lab appointment with the Cancer Center please come in thru the Main Entrance and check in at the main information desk.  You need to re-schedule your appointment should you arrive 10 or more minutes late.  We strive to give you quality time with our providers, and arriving late affects you and other patients whose appointments are after yours.  Also, if you no show three or more times for appointments you may be dismissed from the clinic at the providers discretion.     Again, thank you for choosing [facility].  Our hope is that these requests will decrease the amount of time that you wait before being seen by our physicians.       _____________________________________________________________  Should you have questions after your visit to [facility], please contact our office at [phone] and follow the prompts.  Our office hours are 8:00 a.m. and 4:30 p.m. Monday - Friday.  Please note that voicemails left after 4:00 p.m. may not be returned until the following business day.  We are closed weekends and major holidays.  You do have access to a nurse 24-7, just call the main number to the clinic [phone] and do not press any options, hold on the line and a nurse will answer the phone.    For prescription refill requests, have your pharmacy contact our office and allow 72 hours.

## 2023-08-18 NOTE — Telephone Encounter (Signed)
 Datscan  added.

## 2023-08-31 ENCOUNTER — Other Ambulatory Visit: Payer: Self-pay | Admitting: Neurology

## 2023-09-02 ENCOUNTER — Other Ambulatory Visit: Payer: Self-pay | Admitting: Physician Assistant

## 2023-09-02 DIAGNOSIS — R634 Abnormal weight loss: Secondary | ICD-10-CM

## 2023-09-02 NOTE — Progress Notes (Signed)
 Patient's daughter called to advise of ongoing weight loss, and states that they are agreeable to CT CAP now.  I will see her for follow-up 1 week after CT scan.

## 2023-09-03 DIAGNOSIS — H2513 Age-related nuclear cataract, bilateral: Secondary | ICD-10-CM | POA: Diagnosis not present

## 2023-09-03 DIAGNOSIS — H401131 Primary open-angle glaucoma, bilateral, mild stage: Secondary | ICD-10-CM | POA: Diagnosis not present

## 2023-09-03 DIAGNOSIS — H47233 Glaucomatous optic atrophy, bilateral: Secondary | ICD-10-CM | POA: Diagnosis not present

## 2023-09-03 DIAGNOSIS — H04123 Dry eye syndrome of bilateral lacrimal glands: Secondary | ICD-10-CM | POA: Diagnosis not present

## 2023-09-03 MED ORDER — DONEPEZIL HCL 5 MG PO TABS: 5.0000 mg | ORAL_TABLET | Freq: Every day | ORAL | 0 refills | Status: DC

## 2023-09-03 NOTE — Telephone Encounter (Signed)
 Per patient daughter Lupita Leash, she feels like the patient will need to stay on the 5 mg for right now. And check in at the end of this month.

## 2023-09-04 ENCOUNTER — Other Ambulatory Visit: Payer: Self-pay

## 2023-09-04 ENCOUNTER — Inpatient Hospital Stay (HOSPITAL_COMMUNITY)
Admission: EM | Admit: 2023-09-04 | Discharge: 2023-09-06 | DRG: 683 | Disposition: A | Discharge status: Home / Self Care | Attending: Internal Medicine | Admitting: Internal Medicine

## 2023-09-04 ENCOUNTER — Emergency Department (HOSPITAL_COMMUNITY)

## 2023-09-04 ENCOUNTER — Encounter (HOSPITAL_COMMUNITY): Payer: Self-pay | Admitting: *Deleted

## 2023-09-04 DIAGNOSIS — N179 Acute kidney failure, unspecified: Secondary | ICD-10-CM | POA: Diagnosis not present

## 2023-09-04 DIAGNOSIS — Z833 Family history of diabetes mellitus: Secondary | ICD-10-CM | POA: Diagnosis not present

## 2023-09-04 DIAGNOSIS — Z801 Family history of malignant neoplasm of trachea, bronchus and lung: Secondary | ICD-10-CM

## 2023-09-04 DIAGNOSIS — Z79899 Other long term (current) drug therapy: Secondary | ICD-10-CM

## 2023-09-04 DIAGNOSIS — R7989 Other specified abnormal findings of blood chemistry: Secondary | ICD-10-CM | POA: Diagnosis present

## 2023-09-04 DIAGNOSIS — N1832 Chronic kidney disease, stage 3b: Secondary | ICD-10-CM | POA: Diagnosis not present

## 2023-09-04 DIAGNOSIS — Z90710 Acquired absence of both cervix and uterus: Secondary | ICD-10-CM

## 2023-09-04 DIAGNOSIS — J449 Chronic obstructive pulmonary disease, unspecified: Secondary | ICD-10-CM | POA: Diagnosis present

## 2023-09-04 DIAGNOSIS — Z6824 Body mass index (BMI) 24.0-24.9, adult: Secondary | ICD-10-CM

## 2023-09-04 DIAGNOSIS — E876 Hypokalemia: Secondary | ICD-10-CM | POA: Insufficient documentation

## 2023-09-04 DIAGNOSIS — Z7984 Long term (current) use of oral hypoglycemic drugs: Secondary | ICD-10-CM

## 2023-09-04 DIAGNOSIS — F0390 Unspecified dementia without behavioral disturbance: Secondary | ICD-10-CM | POA: Diagnosis present

## 2023-09-04 DIAGNOSIS — Z807 Family history of other malignant neoplasms of lymphoid, hematopoietic and related tissues: Secondary | ICD-10-CM

## 2023-09-04 DIAGNOSIS — R627 Adult failure to thrive: Secondary | ICD-10-CM | POA: Diagnosis not present

## 2023-09-04 DIAGNOSIS — Z9011 Acquired absence of right breast and nipple: Secondary | ICD-10-CM

## 2023-09-04 DIAGNOSIS — G4730 Sleep apnea, unspecified: Secondary | ICD-10-CM | POA: Diagnosis present

## 2023-09-04 DIAGNOSIS — R634 Abnormal weight loss: Secondary | ICD-10-CM | POA: Diagnosis present

## 2023-09-04 DIAGNOSIS — E1122 Type 2 diabetes mellitus with diabetic chronic kidney disease: Secondary | ICD-10-CM | POA: Diagnosis not present

## 2023-09-04 DIAGNOSIS — Z8041 Family history of malignant neoplasm of ovary: Secondary | ICD-10-CM

## 2023-09-04 DIAGNOSIS — R419 Unspecified symptoms and signs involving cognitive functions and awareness: Secondary | ICD-10-CM | POA: Insufficient documentation

## 2023-09-04 DIAGNOSIS — D500 Iron deficiency anemia secondary to blood loss (chronic): Secondary | ICD-10-CM | POA: Diagnosis not present

## 2023-09-04 DIAGNOSIS — Z8249 Family history of ischemic heart disease and other diseases of the circulatory system: Secondary | ICD-10-CM

## 2023-09-04 DIAGNOSIS — J189 Pneumonia, unspecified organism: Secondary | ICD-10-CM | POA: Diagnosis not present

## 2023-09-04 DIAGNOSIS — Z853 Personal history of malignant neoplasm of breast: Secondary | ICD-10-CM | POA: Diagnosis not present

## 2023-09-04 DIAGNOSIS — I2510 Atherosclerotic heart disease of native coronary artery without angina pectoris: Secondary | ICD-10-CM

## 2023-09-04 DIAGNOSIS — R059 Cough, unspecified: Secondary | ICD-10-CM | POA: Diagnosis not present

## 2023-09-04 DIAGNOSIS — I679 Cerebrovascular disease, unspecified: Secondary | ICD-10-CM | POA: Diagnosis present

## 2023-09-04 DIAGNOSIS — Z955 Presence of coronary angioplasty implant and graft: Secondary | ICD-10-CM

## 2023-09-04 DIAGNOSIS — Z8673 Personal history of transient ischemic attack (TIA), and cerebral infarction without residual deficits: Secondary | ICD-10-CM | POA: Diagnosis not present

## 2023-09-04 DIAGNOSIS — I129 Hypertensive chronic kidney disease with stage 1 through stage 4 chronic kidney disease, or unspecified chronic kidney disease: Secondary | ICD-10-CM | POA: Diagnosis not present

## 2023-09-04 DIAGNOSIS — Z888 Allergy status to other drugs, medicaments and biological substances status: Secondary | ICD-10-CM

## 2023-09-04 DIAGNOSIS — R8271 Bacteriuria: Secondary | ICD-10-CM | POA: Diagnosis present

## 2023-09-04 DIAGNOSIS — E782 Mixed hyperlipidemia: Secondary | ICD-10-CM | POA: Diagnosis present

## 2023-09-04 DIAGNOSIS — E871 Hypo-osmolality and hyponatremia: Secondary | ICD-10-CM | POA: Insufficient documentation

## 2023-09-04 DIAGNOSIS — K219 Gastro-esophageal reflux disease without esophagitis: Secondary | ICD-10-CM | POA: Diagnosis not present

## 2023-09-04 DIAGNOSIS — Z7982 Long term (current) use of aspirin: Secondary | ICD-10-CM

## 2023-09-04 DIAGNOSIS — Z87891 Personal history of nicotine dependence: Secondary | ICD-10-CM

## 2023-09-04 DIAGNOSIS — R0602 Shortness of breath: Secondary | ICD-10-CM | POA: Diagnosis not present

## 2023-09-04 DIAGNOSIS — E119 Type 2 diabetes mellitus without complications: Secondary | ICD-10-CM

## 2023-09-04 DIAGNOSIS — D563 Thalassemia minor: Secondary | ICD-10-CM | POA: Diagnosis present

## 2023-09-04 DIAGNOSIS — M546 Pain in thoracic spine: Secondary | ICD-10-CM | POA: Diagnosis present

## 2023-09-04 DIAGNOSIS — I10 Essential (primary) hypertension: Secondary | ICD-10-CM | POA: Diagnosis present

## 2023-09-04 DIAGNOSIS — Z820 Family history of epilepsy and other diseases of the nervous system: Secondary | ICD-10-CM

## 2023-09-04 DIAGNOSIS — Z90721 Acquired absence of ovaries, unilateral: Secondary | ICD-10-CM

## 2023-09-04 DIAGNOSIS — Z825 Family history of asthma and other chronic lower respiratory diseases: Secondary | ICD-10-CM

## 2023-09-04 DIAGNOSIS — N1831 Chronic kidney disease, stage 3a: Secondary | ICD-10-CM | POA: Diagnosis present

## 2023-09-04 DIAGNOSIS — Z803 Family history of malignant neoplasm of breast: Secondary | ICD-10-CM

## 2023-09-04 LAB — URINALYSIS, ROUTINE W REFLEX MICROSCOPIC
Bilirubin Urine: NEGATIVE
Glucose, UA: NEGATIVE mg/dL
Ketones, ur: NEGATIVE mg/dL
Nitrite: NEGATIVE
Protein, ur: 30 mg/dL — AB
Specific Gravity, Urine: 1.008 (ref 1.005–1.030)
pH: 5 (ref 5.0–8.0)

## 2023-09-04 LAB — CBC WITH DIFFERENTIAL/PLATELET
Abs Immature Granulocytes: 0.03 10*3/uL (ref 0.00–0.07)
Basophils Absolute: 0 10*3/uL (ref 0.0–0.1)
Basophils Relative: 0 %
Eosinophils Absolute: 0 10*3/uL (ref 0.0–0.5)
Eosinophils Relative: 0 %
HCT: 33.7 % — ABNORMAL LOW (ref 36.0–46.0)
Hemoglobin: 10.7 g/dL — ABNORMAL LOW (ref 12.0–15.0)
Immature Granulocytes: 0 %
Lymphocytes Relative: 13 %
Lymphs Abs: 0.9 10*3/uL (ref 0.7–4.0)
MCH: 24.5 pg — ABNORMAL LOW (ref 26.0–34.0)
MCHC: 31.8 g/dL (ref 30.0–36.0)
MCV: 77.3 fL — ABNORMAL LOW (ref 80.0–100.0)
Monocytes Absolute: 1.1 10*3/uL — ABNORMAL HIGH (ref 0.1–1.0)
Monocytes Relative: 15 %
Neutro Abs: 5.1 10*3/uL (ref 1.7–7.7)
Neutrophils Relative %: 72 %
Platelets: 236 10*3/uL (ref 150–400)
RBC: 4.36 MIL/uL (ref 3.87–5.11)
RDW: 16.3 % — ABNORMAL HIGH (ref 11.5–15.5)
WBC: 7.2 10*3/uL (ref 4.0–10.5)
nRBC: 0 % (ref 0.0–0.2)

## 2023-09-04 LAB — COMPREHENSIVE METABOLIC PANEL WITH GFR
ALT: 18 U/L (ref 0–44)
AST: 30 U/L (ref 15–41)
Albumin: 3.6 g/dL (ref 3.5–5.0)
Alkaline Phosphatase: 41 U/L (ref 38–126)
Anion gap: 11 (ref 5–15)
BUN: 41 mg/dL — ABNORMAL HIGH (ref 8–23)
CO2: 28 mmol/L (ref 22–32)
Calcium: 9 mg/dL (ref 8.9–10.3)
Chloride: 92 mmol/L — ABNORMAL LOW (ref 98–111)
Creatinine, Ser: 2.24 mg/dL — ABNORMAL HIGH (ref 0.44–1.00)
GFR, Estimated: 22 mL/min — ABNORMAL LOW (ref >60)
Glucose, Bld: 98 mg/dL (ref 70–99)
Potassium: 3.8 mmol/L (ref 3.5–5.1)
Sodium: 131 mmol/L — ABNORMAL LOW (ref 135–145)
Total Bilirubin: 0.5 mg/dL (ref 0.0–1.2)
Total Protein: 6.5 g/dL (ref 6.5–8.1)

## 2023-09-04 LAB — D-DIMER, QUANTITATIVE: D-Dimer, Quant: 0.49 ug{FEU}/mL (ref 0.00–0.50)

## 2023-09-04 MED ORDER — ONDANSETRON HCL 4 MG/2ML IJ SOLN: 4.0000 mg | Freq: Four times a day (QID) | INTRAMUSCULAR | Status: DC | PRN

## 2023-09-04 MED ORDER — LACTATED RINGERS IV SOLN: INTRAVENOUS | Status: DC

## 2023-09-04 MED ORDER — DONEPEZIL HCL 5 MG PO TABS
5.0000 mg | ORAL_TABLET | Freq: Every day | ORAL | Status: DC
  Administered 2023-09-04 – 2023-09-05 (×2): 5 mg via ORAL
  Filled 2023-09-04 (×2): qty 1

## 2023-09-04 MED ORDER — ENOXAPARIN SODIUM 30 MG/0.3ML IJ SOSY
30.0000 mg | PREFILLED_SYRINGE | INTRAMUSCULAR | Status: DC
  Administered 2023-09-04 – 2023-09-05 (×2): 30 mg via SUBCUTANEOUS
  Filled 2023-09-04 (×2): qty 0.3

## 2023-09-04 MED ORDER — GUAIFENESIN-DM 100-10 MG/5ML PO SYRP
5.0000 mL | ORAL_SOLUTION | ORAL | Status: DC | PRN
Start: 2023-09-04 — End: 2023-09-06
  Administered 2023-09-04 – 2023-09-05 (×2): 5 mL via ORAL
  Filled 2023-09-04 (×2): qty 5

## 2023-09-04 MED ORDER — ENSURE ENLIVE PO LIQD: 237.0000 mL | Freq: Two times a day (BID) | ORAL | Status: DC

## 2023-09-04 MED ORDER — ONDANSETRON HCL 4 MG PO TABS: 4.0000 mg | ORAL_TABLET | Freq: Four times a day (QID) | ORAL | Status: DC | PRN

## 2023-09-04 MED ORDER — POLYETHYLENE GLYCOL 3350 17 G PO PACK: 17.0000 g | PACK | Freq: Every day | ORAL | Status: DC | PRN

## 2023-09-04 MED ORDER — CARVEDILOL 3.125 MG PO TABS
6.2500 mg | ORAL_TABLET | Freq: Two times a day (BID) | ORAL | Status: DC
  Administered 2023-09-05: 6.25 mg via ORAL
  Filled 2023-09-04: qty 2

## 2023-09-04 MED ORDER — SODIUM CHLORIDE 0.9 % IV BOLUS
1000.0000 mL | Freq: Once | INTRAVENOUS | Status: AC
  Administered 2023-09-04: 1000 mL via INTRAVENOUS

## 2023-09-04 MED ORDER — IRBESARTAN-HYDROCHLOROTHIAZIDE 300-12.5 MG PO TABS: 1.0000 | ORAL_TABLET | Freq: Every day | ORAL | Status: DC

## 2023-09-04 MED ORDER — AMLODIPINE BESYLATE 5 MG PO TABS
10.0000 mg | ORAL_TABLET | Freq: Every day | ORAL | Status: DC
  Administered 2023-09-05: 10 mg via ORAL
  Filled 2023-09-04: qty 2

## 2023-09-04 MED ORDER — ROSUVASTATIN CALCIUM 20 MG PO TABS
20.0000 mg | ORAL_TABLET | Freq: Every day | ORAL | Status: DC
  Administered 2023-09-05: 20 mg via ORAL
  Filled 2023-09-04: qty 1

## 2023-09-04 NOTE — ED Provider Notes (Signed)
 [facility]PART[facility] AT [facility] Provider Note   CSN: [id number] Arrival date & time: 09/04/23  1149     History  Chief Complaint  Patient presents with   Back Pain    Nancy Liu is a 83 y.o. female.  Past medical history of DCIS of right breast in remission, COPD, GERD, tobacco abuse in remission, diabetes, hypertension, hyperlipidemia presented to the emergency room with complaint of mid upper left back pain that started about 3 days ago. Reports "injury" 2 months ago, but at the time of injury never had any pain, wondering if it is related. Pain is worse with movement. Patient reports associated symptom of cough and some shortness of breath with exertion.  She does not feel that she is bringing anything up when she coughs.  Reports this feels little bit worse than normal. Also reports for the last 1-2 weeks she has had decreased appetite, weight loss which is feels is about 1lb per day. Denies associated chest pain, abdominal pain, NVD. No fevers or chills. Denies dysuria.    Back Pain      Home Medications Prior to Admission medications   Medication Sig Start Date End Date Taking? Authorizing Provider  amLODipine (NORVASC) 10 MG tablet TAKE ONE TABLET BY MOUTH EVERYDAY AT BEDTIME 10/17/22   Antoine Poche, MD  aspirin EC 81 MG tablet Take 81 mg by mouth daily. Swallow whole.    [provider]  Blood Glucose Monitoring Suppl (ONE TOUCH ULTRA 2) w/Device KIT SMARTSIG:Via Meter 06/02/23   [provider]  carvedilol (COREG) 6.25 MG tablet TAKE ONE TABLET BY MOUTH BEFORE BREAKFAST and TAKE ONE TABLET BY MOUTH EVERYDAY AT BEDTIME 07/01/23   Vassie Loll, MD  cholecalciferol (VITAMIN D3) 25 MCG (1000 UNIT) tablet Take 1,000 Units by mouth daily.    [provider]  Cholecalciferol (VITAMIN D3) 50 MCG (2000 UT) TABS TAKE ONE TABLET BY MOUTH daily in addition TO THE 1,000unit FOR total of 3,000units daily 05/02/22   Pennington,  Rebekah M, PA-C  donepezil (ARICEPT) 5 MG tablet Take 1 tablet (5 mg total) by mouth at bedtime. 09/03/23   Van Clines, MD  ferrous sulfate 325 (65 FE) MG EC tablet Take one tablet three times a week 07/27/23   Tiffany Kocher, PA-C  irbesartan-hydrochlorothiazide (AVALIDE) 300-12.5 MG tablet Take 1 tablet by mouth daily before breakfast. 10/17/22   Antoine Poche, MD  metFORMIN (GLUCOPHAGE) 500 MG tablet Take 500 mg by mouth at bedtime. 11/05/21   [provider]  ONETOUCH ULTRA test strip  06/02/23   [provider]  polyethylene glycol powder (GLYCOLAX/MIRALAX) 17 GM/SCOOP powder Take 1/2 capful daily as needed. Make sure to take a dose if you go more than 48 hours without a bowel movement. 11/01/21   Tiffany Kocher, PA-C  rosuvastatin (CRESTOR) 20 MG tablet TAKE ONE TABLET BY MOUTH EVERYDAY AT BEDTIME 10/17/22   Antoine Poche, MD  triamcinolone ointment (KENALOG) 0.5 % Apply 1 application  topically 2 (two) times daily as needed (rash).    [provider]      Allergies    Protonix [pantoprazole sodium]    Review of Systems   Review of Systems  Musculoskeletal:  Positive for back pain.    Physical Exam Updated Vital Signs BP (!) 94/58   Pulse 86   Temp 98.5 F (36.9 C) (Oral)   Resp 18   Ht 5' 2.5" (1.588 m)   [employer]  61.2 kg   SpO2 98%   BMI 24.30 kg/m  Physical Exam Vitals and nursing note reviewed.  Constitutional:      General: She is not in acute distress.    Appearance: She is not toxic-appearing.  HENT:     Head: Normocephalic and atraumatic.  Eyes:     General: No scleral icterus.    Conjunctiva/sclera: Conjunctivae normal.  Cardiovascular:     Rate and Rhythm: Normal rate and regular rhythm.     Pulses: Normal pulses.     Heart sounds: Normal heart sounds.  Pulmonary:     Effort: Pulmonary effort is normal. No respiratory distress.     Breath sounds: Normal breath sounds.  Abdominal:     General: Abdomen is flat. Bowel sounds  are normal.     Palpations: Abdomen is soft.     Tenderness: There is no abdominal tenderness.  Musculoskeletal:     Right lower leg: No edema.     Left lower leg: No edema.  Skin:    General: Skin is warm and dry.     Findings: No lesion.  Neurological:     General: No focal deficit present.     Mental Status: She is alert and oriented to person, place, and time. Mental status is at baseline.     ED Results / Procedures / Treatments   Labs (all labs ordered are listed, but only abnormal results are displayed) Labs Reviewed  URINALYSIS, ROUTINE W REFLEX MICROSCOPIC - Abnormal; Notable for the following components:      Result Value   APPearance CLOUDY (*)    Hgb urine dipstick MODERATE (*)    Protein, ur 30 (*)    Leukocytes,Ua TRACE (*)    Bacteria, UA MANY (*)    All other components within normal limits  CBC WITH DIFFERENTIAL/PLATELET - Abnormal; Notable for the following components:   Hemoglobin 10.7 (*)    HCT 33.7 (*)    MCV 77.3 (*)    MCH 24.5 (*)    RDW 16.3 (*)    Monocytes Absolute 1.1 (*)    All other components within normal limits  COMPREHENSIVE METABOLIC PANEL WITH GFR - Abnormal; Notable for the following components:   Sodium 131 (*)    Chloride 92 (*)    BUN 41 (*)    Creatinine, Ser 2.24 (*)    GFR, Estimated 22 (*)    All other components within normal limits  D-DIMER, QUANTITATIVE    EKG None  Radiology DG Chest 2 View Result Date: 09/04/2023 CLINICAL DATA:  Cough.  Shortness of breath. EXAM: CHEST - 2 VIEW COMPARISON:  06/28/2023. FINDINGS: Bilateral lung fields are clear. Bilateral costophrenic angles are clear. Normal cardio-mediastinal silhouette. No acute osseous abnormalities. The soft tissues are within normal limits. IMPRESSION: No active cardiopulmonary disease. Electronically Signed   By: Jules Schick M.D.   On: 09/04/2023 15:17    Procedures Procedures    Medications Ordered in ED Medications - No data to display  ED Course/  Medical Decision Making/ A&P                                 Medical Decision Making Amount and/or Complexity of Data Reviewed Labs: ordered. Radiology: ordered.  Risk Decision regarding hospitalization.   This patient presents to the ED for concern of back pain, this involves an extensive number of treatment options, and is a complaint that carries  with it a high risk of complications and morbidity.  The differential diagnosis includes metastasis, strain, pneumonia, pneumothorax, fracture    Problem List / ED Course / Critical interventions / Medication management  Patient presenting with generalized weakness.  She has history of breast cancer.  She is currently in remission.  She still follows with heme-onc.  Due to recent weight loss and decreased appetite heme-onc and recommended she get CT chest abdomen pelvis with contrast outpatient.  Patient reports she started having cough and back pain incision wanted to come in sooner. Alert and oriented, no focal deficits.  She has no focal area of abdominal tenderness.  Lungs are clear to auscultation.  Will obtain chest x-ray to rule out any pneumonia, pneumothorax.  Will obtain D-dimer.  Check basic labs and UA. Denies chest pain and significant shortness of breath, has noted cough. No fever.  Patient's CMP significant for AKI with large bump in creatinine from baseline.  GFR is 22 suspect this is due to poor oral intake.  Will give IV fluids.  CBC is without leukocytosis and hemoglobin is stable.  D-dimer is negative thus I do not feel she needs any further PE workup.  Feel she would benefit from rehydration and pending return to normal kidney function obtain CT abdomen pelvis at that time given oncology's recommendations from outpatient note.  UA is pending but she denies urinary symptoms. I ordered medication including NS. Will admit for generalized weakness and Aki for rehydration.  Reevaluation of the patient after these medicines showed  that the patient improved I have reviewed the patients home medicines and have made adjustments as needed   Plan  AKI          Final Clinical Impression(s) / ED Diagnoses Final diagnoses:  AKI (acute kidney injury) [facility])    Rx / DC Orders ED Discharge Orders     None         Reinaldo Raddle 09/05/23 [place], MD 09/06/23 1204

## 2023-09-04 NOTE — H&P (Signed)
 History and Physical    Patient: Nancy Liu ZOX:[id number] DOB: [birth year] DOA: 09/04/2023 DOS: the patient was seen and examined on 09/04/2023 PCP: Renaye Rakers, MD  Patient coming from: Home  Chief Complaint:  Chief Complaint  Patient presents with   Back Pain   HPI: Nancy Liu is a 83 y.o. female with medical history significant of coronary artery disease, COPD, diabetes on metformin, hyperlipidemia, hypertension, history of tobacco abuse in remission.  Patient also has a history of breast cancer and is status post total right mastectomy in 2022.  Patient presents with 2 to 3 weeks of decreased appetite, decreased oral intake of solid food and weight loss of about a pound a day.  Colonic history is obtained from the patient as well as from patient's 2 daughters were in the room with her.  They relate that her appetite in the morning is generally okay but decreases as the days go on.  By dinnertime, she does not eat much.  Her fluid intake, especially water, is good.  She has 4-5, glasses of water, approximately 80 ounces in total.  She denies abdominal pain, abdominal distention, chest pain, shortness of breath she did have a conversation with her oncologist who has ordered CT scans, although she has not been able to get them done.  She did have a new medication added a couple days ago: She was started on Aricept for dementia.  However, her weight loss started before she started the medication.  No other changes to her medications.  Review of Systems: As mentioned in the history of present illness. All other systems reviewed and are negative. Past Medical History:  Diagnosis Date   Arteriosclerotic cardiovascular disease (ASCVD) 2001   Bare-metal stent placed in the right coronary artery in 12/01; residual 50% lesion of the first diagonal and mid LAD   Breast cancer (HCC)    Cerebrovascular disease    COPD (chronic obstructive pulmonary disease) (HCC)    Cyst, dermoid, arm, left  03/26/2018   Diabetes mellitus    excellent control with a low-dose of a single oral agent   Family history of breast cancer 01/23/2021   Family history of ovarian cancer 01/23/2021   GERD (gastroesophageal reflux disease)    with ulcers   History of right coronary artery stent placement 2000   Hyperlipidemia    Hypertension    Sleep apnea    Thalassemia minor    Tobacco abuse, in remission    35 pack years; Quit in 1980   Past Surgical History:  Procedure Laterality Date   BIOPSY  02/28/2020   Procedure: BIOPSY;  Surgeon: Lanelle Bal, DO;  Location: [facility];  Service: Endoscopy;;   COLONOSCOPY  2006   Dr. Loreta Ave: normal   COLONOSCOPY  2000   Dr. Tad Moore: normal    COLONOSCOPY N/A 03/03/2016   Dr. Darrick Penna: diverticulosis, non-bleeding hemorrhoids, redundant left colon.   DILATION AND CURETTAGE OF UTERUS     ESOPHAGOGASTRODUODENOSCOPY  2000   Dr. Tad Moore: gastritis    ESOPHAGOGASTRODUODENOSCOPY  2006   Dr. Loreta Ave: small hiatal hernia, gastritis, negative H.pylori    ESOPHAGOGASTRODUODENOSCOPY N/A 03/03/2016   Procedure: ESOPHAGOGASTRODUODENOSCOPY (EGD);  Surgeon: West Bali, MD;  Location: [facility];  Service: Endoscopy;  Laterality: N/A;   ESOPHAGOGASTRODUODENOSCOPY N/A 06/03/2016   Dr. Darrick Penna: nonaggressive gastritis due to aspirin use. Previous ulcers had healed.   ESOPHAGOGASTRODUODENOSCOPY (EGD) WITH PROPOFOL N/A 02/28/2020   focal inflammation in the gastric antrum consistent with gastritis  on biopsies. No H.pylori.    EXCISION OF KELOID Left 03/26/2018   Procedure: EXCISION OF LEFT ARM MASS;  Surgeon: Claud Kelp, MD;  Location: [facility];  Service: General;  Laterality: Left;   GIVENS CAPSULE STUDY N/A 10/10/2020   Procedure: GIVENS CAPSULE STUDY;  Surgeon: Lanelle Bal, DO;  Location: [facility];  Service: Endoscopy;  Laterality: N/A;  7:30am   INGUINAL HERNIA REPAIR  1970s   Right   TOTAL MASTECTOMY Right 02/20/2021   Procedure: RIGHT  TOTAL MASTECTOMY;  Surgeon: Emelia Loron, MD;  Location: [facility];  Service: General;  Laterality: Right;   VAGINAL HYSTERECTOMY  1980   Unilateral oophorectomy   Social History:  reports that she quit smoking about 43 years ago. Her smoking use included cigarettes. She started smoking about 58 years ago. She has a 18 pack-year smoking history. She has never used smokeless tobacco. She reports that she does not drink alcohol and does not use drugs.  Allergies  Allergen Reactions   Protonix [Pantoprazole Sodium]     DRY LIPS    Family History  Problem Relation Age of Onset   Heart disease Mother        also hypertension and asthma   Lung cancer Mother        dx 61s   Ovarian cancer Mother        dx before 53   Alzheimer's disease Maternal Grandmother    Diabetes Half-Sister    Breast cancer Half-Sister 47   Multiple myeloma Nephew 31       war-related exposures   Colon cancer Neg Hx    Colon polyps Neg Hx     Prior to Admission medications   Medication Sig Start Date End Date Taking? Authorizing Provider  amLODipine (NORVASC) 10 MG tablet TAKE ONE TABLET BY MOUTH EVERYDAY AT BEDTIME Patient taking differently: Take 10 mg by mouth daily. 10/17/22  Yes BranchDorothe Pea, MD  aspirin EC 81 MG tablet Take 81 mg by mouth every 4 (four) hours as needed for moderate pain (pain score 4-6) (Pain).   Yes [provider]  carvedilol (COREG) 6.25 MG tablet TAKE ONE TABLET BY MOUTH BEFORE BREAKFAST and TAKE ONE TABLET BY MOUTH EVERYDAY AT BEDTIME 07/01/23  Yes Vassie Loll, MD  Cyanocobalamin (B-12 PO) Take 1 tablet by mouth daily.   Yes [provider]  metFORMIN (GLUCOPHAGE) 500 MG tablet Take 500 mg by mouth at bedtime. 11/05/21  Yes [provider]  cholecalciferol (VITAMIN D3) 25 MCG (1000 UNIT) tablet Take 1,000 Units by mouth daily.    [provider]  Cholecalciferol (VITAMIN D3) 50 MCG (2000 UT) TABS TAKE ONE TABLET BY MOUTH daily in addition TO  THE 1,000unit FOR total of 3,000units daily 05/02/22   Pennington, Rebekah M, PA-C  donepezil (ARICEPT) 5 MG tablet Take 1 tablet (5 mg total) by mouth at bedtime. 09/03/23   Van Clines, MD  ferrous sulfate 325 (65 FE) MG EC tablet Take one tablet three times a week 07/27/23   Tiffany Kocher, PA-C  irbesartan-hydrochlorothiazide (AVALIDE) 300-12.5 MG tablet Take 1 tablet by mouth daily before breakfast. 10/17/22   Antoine Poche, MD  polyethylene glycol powder (GLYCOLAX/MIRALAX) 17 GM/SCOOP powder Take 1/2 capful daily as needed. Make sure to take a dose if you go more than 48 hours without a bowel movement. 11/01/21   Tiffany Kocher, PA-C  rosuvastatin (CRESTOR) 20 MG tablet TAKE ONE TABLET BY MOUTH EVERYDAY AT  BEDTIME 10/17/22   Antoine Poche, MD  triamcinolone ointment (KENALOG) 0.5 % Apply 1 application  topically 2 (two) times daily as needed (rash).    [provider]    Physical Exam: Vitals:   09/04/23 1605 09/04/23 1615 09/04/23 1630 09/04/23 1650  BP:    125/60  Pulse:  76 79 83  Resp:  17 18 14   Temp: 98.3 F (36.8 C)   99 F (37.2 C)  TempSrc: Oral   Oral  SpO2:  100% 99% 100%  Weight:      Height:       General: Elderly female. Awake and alert and oriented x3. No acute cardiopulmonary distress.  HEENT: Normocephalic atraumatic.  Right and left ears normal in appearance.  Pupils equal, round, reactive to light. Extraocular muscles are intact. Sclerae anicteric and noninjected.  Moist mucosal membranes. No mucosal lesions.  Neck: Neck supple without lymphadenopathy. No carotid bruits. No masses palpated.  Cardiovascular: Regular rate with normal S1-S2 sounds. No murmurs, rubs, gallops auscultated. No JVD.  Respiratory: Good respiratory effort with no wheezes, rales, rhonchi. Lungs clear to auscultation bilaterally.  No accessory muscle use. Abdomen: Soft, nontender, nondistended. Active bowel sounds. No masses or hepatosplenomegaly  Skin: No rashes, lesions,  or ulcerations.  Dry, warm to touch. 2+ dorsalis pedis and radial pulses. Musculoskeletal: No calf or leg pain. All major joints not erythematous nontender.  No upper or lower joint deformation.  Good ROM.  No contractures  Psychiatric: Intact judgment and insight. Pleasant and cooperative. Neurologic: No focal neurological deficits. Strength is 5/5 and symmetric in upper and lower extremities.  Cranial nerves II through XII are grossly intact.  Data Reviewed: Results for orders placed or performed during the hospital encounter of 09/04/23 (from the past 24 hours)  Urinalysis, Routine w reflex microscopic -Urine, Clean Catch     Status: Abnormal   Collection Time: 09/04/23  1:49 PM  Result Value Ref Range   Color, Urine YELLOW YELLOW   APPearance CLOUDY (A) CLEAR   Specific Gravity, Urine 1.008 1.005 - 1.030   pH 5.0 5.0 - 8.0   Glucose, UA NEGATIVE NEGATIVE mg/dL   Hgb urine dipstick MODERATE (A) NEGATIVE   Bilirubin Urine NEGATIVE NEGATIVE   Ketones, ur NEGATIVE NEGATIVE mg/dL   Protein, ur 30 (A) NEGATIVE mg/dL   Nitrite NEGATIVE NEGATIVE   Leukocytes,Ua TRACE (A) NEGATIVE   RBC / HPF 6-10 0 - 5 RBC/hpf   WBC, UA 11-20 0 - 5 WBC/hpf   Bacteria, UA MANY (A) NONE SEEN   Squamous Epithelial / HPF 0-5 0 - 5 /HPF   Mucus PRESENT   CBC with Differential     Status: Abnormal   Collection Time: 09/04/23  2:46 PM  Result Value Ref Range   WBC 7.2 4.0 - 10.5 K/uL   RBC 4.36 3.87 - 5.11 MIL/uL   Hemoglobin 10.7 (L) 12.0 - 15.0 g/dL   HCT 16.1 (L) 09.6 - 04.5 %   MCV 77.3 (L) 80.0 - 100.0 fL   MCH 24.5 (L) 26.0 - 34.0 pg   MCHC 31.8 30.0 - 36.0 g/dL   RDW 40.9 (H) 81.1 - 91.4 %   Platelets 236 150 - 400 K/uL   nRBC 0.0 0.0 - 0.2 %   Neutrophils Relative % 72 %   Neutro Abs 5.1 1.7 - 7.7 K/uL   Lymphocytes Relative 13 %   Lymphs Abs 0.9 0.7 - 4.0 K/uL   Monocytes Relative 15 %   Monocytes  Absolute 1.1 (H) 0.1 - 1.0 K/uL   Eosinophils Relative 0 %   Eosinophils Absolute 0.0 0.0 -  0.5 K/uL   Basophils Relative 0 %   Basophils Absolute 0.0 0.0 - 0.1 K/uL   Immature Granulocytes 0 %   Abs Immature Granulocytes 0.03 0.00 - 0.07 K/uL  Comprehensive metabolic panel     Status: Abnormal   Collection Time: 09/04/23  2:46 PM  Result Value Ref Range   Sodium 131 (L) 135 - 145 mmol/L   Potassium 3.8 3.5 - 5.1 mmol/L   Chloride 92 (L) 98 - 111 mmol/L   CO2 28 22 - 32 mmol/L   Glucose, Bld 98 70 - 99 mg/dL   BUN 41 (H) 8 - 23 mg/dL   Creatinine, Ser 4.40 (H) 0.44 - 1.00 mg/dL   Calcium 9.0 8.9 - 10.2 mg/dL   Total Protein 6.5 6.5 - 8.1 g/dL   Albumin 3.6 3.5 - 5.0 g/dL   AST 30 15 - 41 U/L   ALT 18 0 - 44 U/L   Alkaline Phosphatase 41 38 - 126 U/L   Total Bilirubin 0.5 0.0 - 1.2 mg/dL   GFR, Estimated 22 (L) >60 mL/min   Anion gap 11 5 - 15  D-dimer, quantitative     Status: None   Collection Time: 09/04/23  2:46 PM  Result Value Ref Range   D-Dimer, Quant 0.49 0.00 - 0.50 ug/mL-FEU    DG Chest 2 View Result Date: 09/04/2023 CLINICAL DATA:  Cough.  Shortness of breath. EXAM: CHEST - 2 VIEW COMPARISON:  06/28/2023. FINDINGS: Bilateral lung fields are clear. Bilateral costophrenic angles are clear. Normal cardio-mediastinal silhouette. No acute osseous abnormalities. The soft tissues are within normal limits. IMPRESSION: No active cardiopulmonary disease. Electronically Signed   By: Jules Schick M.D.   On: 09/04/2023 15:17     Assessment and Plan: No notes have been filed under this hospital service. Service: Hospitalist  Principal Problem:   AKI (acute kidney injury) (HCC) Active Problems:   Essential hypertension   CAD S/P percutaneous coronary angioplasty   Cerebrovascular disease   Loss of weight   Non-insulin dependent type 2 diabetes mellitus (HCC)   GERD (gastroesophageal reflux disease)   Chronic kidney disease, stage 3a (HCC)   Failure to thrive in adult  AKI on stage IIIa chronic kidney disease Admit IV fluids Hold metformin and  diuretics Recheck creatinine in the morning Failure to thrive/weight loss With failure to thrive and weight loss, obviously there is a concern for recurrence of cancer With her elevated creatinine, we will need to hold off on CT with contrast. Repeat creatinine in the morning.  If improved, then we will get CTs with contrast to evaluate for metastatic cancer Chest x-ray normal Type 2 diabetes Hold metformin Hypertension Hold ARB and diuretic Coronary artery disease   Advance Care Planning:   Code Status: Full Code confirmed by patient  Consults: None  Family Communication: Daughter is present during interview and exam  Severity of Illness: The appropriate patient status for this patient is INPATIENT. Inpatient status is judged to be reasonable and necessary in order to provide the required intensity of service to ensure the patient's safety. The patient's presenting symptoms, physical exam findings, and initial radiographic and laboratory data in the context of their chronic comorbidities is felt to place them at high risk for further clinical deterioration. Furthermore, it is not anticipated that the patient will be medically stable for discharge from the hospital within 2  midnights of admission.   * I certify that at the point of admission it is my clinical judgment that the patient will require inpatient hospital care spanning beyond 2 midnights from the point of admission due to high intensity of service, high risk for further deterioration and high frequency of surveillance required.*  Author: Levie Heritage, DO 09/04/2023 6:10 PM  For on call review [email or web address].

## 2023-09-04 NOTE — ED Triage Notes (Signed)
 Pt with upper mid back pain for past few days. Pt with hx of breast CA with surgery on right.

## 2023-09-05 ENCOUNTER — Inpatient Hospital Stay (HOSPITAL_COMMUNITY)

## 2023-09-05 DIAGNOSIS — R419 Unspecified symptoms and signs involving cognitive functions and awareness: Secondary | ICD-10-CM | POA: Insufficient documentation

## 2023-09-05 DIAGNOSIS — E876 Hypokalemia: Secondary | ICD-10-CM | POA: Insufficient documentation

## 2023-09-05 DIAGNOSIS — E871 Hypo-osmolality and hyponatremia: Secondary | ICD-10-CM | POA: Insufficient documentation

## 2023-09-05 DIAGNOSIS — N179 Acute kidney failure, unspecified: Secondary | ICD-10-CM | POA: Diagnosis not present

## 2023-09-05 LAB — CBC
HCT: 31.9 % — ABNORMAL LOW (ref 36.0–46.0)
Hemoglobin: 10.3 g/dL — ABNORMAL LOW (ref 12.0–15.0)
MCH: 24.9 pg — ABNORMAL LOW (ref 26.0–34.0)
MCHC: 32.3 g/dL (ref 30.0–36.0)
MCV: 77.1 fL — ABNORMAL LOW (ref 80.0–100.0)
Platelets: 231 10*3/uL (ref 150–400)
RBC: 4.14 MIL/uL (ref 3.87–5.11)
RDW: 16.4 % — ABNORMAL HIGH (ref 11.5–15.5)
WBC: 4.5 10*3/uL (ref 4.0–10.5)
nRBC: 0 % (ref 0.0–0.2)

## 2023-09-05 LAB — URINALYSIS, W/ REFLEX TO CULTURE (INFECTION SUSPECTED)
Bacteria, UA: NONE SEEN
Bilirubin Urine: NEGATIVE
Glucose, UA: NEGATIVE mg/dL
Hgb urine dipstick: NEGATIVE
Ketones, ur: NEGATIVE mg/dL
Leukocytes,Ua: NEGATIVE
Nitrite: NEGATIVE
Protein, ur: NEGATIVE mg/dL
Specific Gravity, Urine: 1.006 (ref 1.005–1.030)
pH: 7 (ref 5.0–8.0)

## 2023-09-05 LAB — BASIC METABOLIC PANEL WITH GFR
Anion gap: 10 (ref 5–15)
BUN: 37 mg/dL — ABNORMAL HIGH (ref 8–23)
CO2: 27 mmol/L (ref 22–32)
Calcium: 8.8 mg/dL — ABNORMAL LOW (ref 8.9–10.3)
Chloride: 101 mmol/L (ref 98–111)
Creatinine, Ser: 1.62 mg/dL — ABNORMAL HIGH (ref 0.44–1.00)
GFR, Estimated: 32 mL/min — ABNORMAL LOW (ref >60)
Glucose, Bld: 94 mg/dL (ref 70–99)
Potassium: 3.2 mmol/L — ABNORMAL LOW (ref 3.5–5.1)
Sodium: 138 mmol/L (ref 135–145)

## 2023-09-05 MED ORDER — POTASSIUM CHLORIDE CRYS ER 20 MEQ PO TBCR
40.0000 meq | EXTENDED_RELEASE_TABLET | Freq: Once | ORAL | Status: AC
  Administered 2023-09-05: 40 meq via ORAL
  Filled 2023-09-05: qty 2

## 2023-09-05 MED ORDER — POTASSIUM CHLORIDE 2 MEQ/ML IV SOLN
INTRAVENOUS | Status: AC
  Filled 2023-09-05 (×3): qty 1000

## 2023-09-05 MED ORDER — ADULT MULTIVITAMIN W/MINERALS CH
1.0000 | ORAL_TABLET | Freq: Every day | ORAL | Status: DC
  Administered 2023-09-05 – 2023-09-06 (×2): 1 via ORAL
  Filled 2023-09-05 (×2): qty 1

## 2023-09-05 MED ORDER — ENSURE ENLIVE PO LIQD
237.0000 mL | Freq: Three times a day (TID) | ORAL | Status: DC
  Administered 2023-09-05 – 2023-09-06 (×4): 237 mL via ORAL

## 2023-09-05 NOTE — Progress Notes (Signed)
  Progress Note   Patient: Nancy Liu WUJ:[id number] DOB: 1940-06-05 DOA: 09/04/2023     1 DOS: the patient was seen and examined on 09/05/2023 at 11:25 AM      Brief hospital course: 83 y.o. F with dementia lives at home, CAD, HTN, HLD, DM, CKD IIIb baseline 1.1-1.2, hx CVA without residuals, thalassemia minor, multifocal DCIS s/ R mastectomy 2022, in remission, IDA, AVMs, depression, vit D deficiency, chronic cough, bronchiectasis, who presented to the ER for mild upper back pain for a few days.  Her heme-onc recommended outpatient CT, but patient came to ER for expedited imaging.    In the ER, found to have incidental increased creatinine.     Assessment and Plan: * AKI (acute kidney injury) (HCC) Acute kidney injury on CKD stage IIIb Creatinine 2.2 on admission, baseline 1.1 due to possible dehydration, transient hypotension in setting of HCT and ARB use.  UA without significant hematuria or white blood cells, but notable bacteria. - Hold irbesartan and hydrochlorothiazide - Continue IV fluids - Avoid hypotension or nephrotoxins -Obtain renal ultrasound    Hyponatremia Mild, asymptomatic, resolved with fluids  Neurocognitive disorder - Continue donepezil  Hypokalemia - Supplement potassium  Failure to thrive in adult Marked weight loss recently - CT pending  Non-insulin dependent type 2 diabetes mellitus (HCC) Glucose controlled - Hold metformin - Okay to hold sliding scale corrections for now, glucoses in normal range  Iron deficiency anemia due to chronic blood loss Hemoglobin stable at 10.3, no significant change from previous - Resume ferrous sulfate at discharge  CAD S/P percutaneous coronary angioplasty Cerebrovascular disease Hypertension Hyperlipidemia Blood pressure soft - Hold amlodipine, carvedilol, irbesartan, HCTZ - Continue Crestor - Hold aspirin           Subjective: Patient is feeling okay, she has no fever, confusion,  dizziness.  Reports overall no concerns other than recent weight loss.    Physical Exam: BP (!) 100/54 (BP Location: Left Arm)   Pulse 73   Temp 98.4 F (36.9 C)   Resp 16   Ht 5' 2.5" (1.588 m)   Wt 61.2 kg   SpO2 100%   BMI 24.30 kg/m   Elderly adult female, lying in bed, interactive and appropriate RRR, no murmurs, no peripheral edema Respiratory normal, lungs clear without rales or wheezes Abdomen soft without tenderness palpation or guarding, no ascites or distention Tension normal, affect normal, judgment and insight appear at baseline, face symmetric, speech fluent, moves upper extremities with normal strength and coordination  Data Reviewed: Basic metabolic panel shows creatinine 1.6, sodium 131, potassium down to 3 point newt, normal bicarb Hemoglobin shows stability at 10.3, no leukocytosis Chest x-ray clear    Family Communication: Daughter at the bedside    Disposition: Status is: Inpatient The patient was evaluated for back pain, found to have incidental acute kidney injury, bacteriuria  We are holding her ARB and HCTZ, giving fluids, and her creatinine is improving  If her creatinine is better tomorrow, we will obtain the CT chest abdomen and pelvis with contrast that was planned as an outpatient and likely discharge home         Author: Alberteen Sam, MD 09/05/2023 3:13 PM  For on call review [email or web address].

## 2023-09-05 NOTE — Progress Notes (Signed)
Transition of Care Department (TOC) has reviewed patient and no TOC needs have been identified at this time. We will continue to monitor patient advancement through interdisciplinary progression rounds. If new patient transition needs arise, please place a TOC consult. 

## 2023-09-05 NOTE — Plan of Care (Signed)

## 2023-09-05 NOTE — Hospital Course (Signed)
 83 y.o. F with dementia lives at home, CAD, HTN, HLD, DM, CKD IIIb baseline 1.1-1.2, hx CVA without residuals, thalassemia minor, multifocal DCIS s/ R mastectomy 2022, in remission, IDA, AVMs, depression, vit D deficiency, chronic cough, bronchiectasis, who presented to the ER for mild upper back pain for a few days.  Her heme-onc recommended outpatient CT, but patient came to ER for expedited imaging.    In the ER, found to have incidental increased creatinine.

## 2023-09-05 NOTE — Assessment & Plan Note (Addendum)
 Acute kidney injury on CKD stage IIIb Creatinine 2.2 on admission, baseline 1.1 due to possible dehydration, transient hypotension in setting of HCT and ARB use.  UA without significant hematuria or white blood cells, but notable bacteria. - Hold irbesartan and hydrochlorothiazide - Continue IV fluids - Avoid hypotension or nephrotoxins -Obtain renal ultrasound

## 2023-09-05 NOTE — Assessment & Plan Note (Signed)
 Hemoglobin stable at 10.3, no significant change from previous - Resume ferrous sulfate at discharge

## 2023-09-05 NOTE — Assessment & Plan Note (Signed)
 Cerebrovascular disease Hypertension Hyperlipidemia Blood pressure soft - Hold amlodipine, carvedilol, irbesartan, HCTZ - Continue Crestor - Hold aspirin

## 2023-09-05 NOTE — Assessment & Plan Note (Signed)
 Glucose controlled - Hold metformin - Okay to hold sliding scale corrections for now, glucoses in normal range

## 2023-09-05 NOTE — Progress Notes (Signed)
 Initial Nutrition Assessment  DOCUMENTATION CODES:   Not applicable  INTERVENTION:   -MVI with minerals daily -Ensure Enlive po TID, each supplement provides 350 kcal and 20 grams of protein -Liberalize diet to regular for widest variety of meal selections  NUTRITION DIAGNOSIS:   Inadequate oral intake related to poor appetite as evidenced by per patient/family report.  GOAL:   Patient will meet greater than or equal to 90% of their needs  MONITOR:   PO intake, Supplement acceptance  REASON FOR ASSESSMENT:   Consult Assessment of nutrition requirement/status  ASSESSMENT:   Pt with medical history significant of coronary artery disease, COPD, diabetes on metformin, hyperlipidemia, hypertension, history of tobacco abuse in remission.  Patient also has a history of breast cancer and is status post total right mastectomy in 2022.  Patient presents with 2 to 3 weeks of decreased appetite, decreased oral intake of solid food and weight loss of about a pound a day.  Pt admitted with AKI on stage 3 CKD and failure to thrive/ weight loss.  Reviewed I/O's: +599 ml x 24 hours and +1.2 L since admission  Pt unavailable at time of visit. Attempted to speak with pt via call to hospital room phone, however, unable to reach. RD unable to obtain further nutrition-related history or complete nutrition-focused physical exam at this time.     Per H&P, daughters report that intake in the more is fair, however, decreased throughout the day. She eats very little at dinner. She drinks about 4-5 glasses of water daily. Per MD, concern for recurrence of cancer. Pt's oncologist has ordered CT scans, but pt has not had them done yet.   Pt currently on a heart healthy, carb modified diet. No meal completion data available to assess at this time Noted Ensure supplements have been ordered.  Reviewed wt hx; pt has experienced a 5.6% wt loss over the past 6 months, which is not significant for time frame.  Pt at high risk for malnutrition secondary to advanced age, multiple co-morbidities, and decreased oral intake, however, unable to identify at this time. Pt would greatly benefit from addition of oral nutrition supplements.  Pt with poor oral intake and would benefit from nutrient dense supplement. One Ensure Enlive supplement provides 350 kcals, 20 grams protein, and 44-45 grams of carbohydrate vs one Glucerna shake supplement, which provides 220 kcals, 10 grams of protein, and 26 grams of carbohydrate. Given pt's hx of DM, RD will reassess adequacy of PO intake, CBGS, and adjust supplement regimen as appropriate at follow-up.    Medications reviewed.  Lab Results  Component Value Date   HGBA1C 5.8 (H) 06/30/2023   PTA DM medications are 500 mg metformin daily.   Labs reviewed: K: 3.2, CBGS: 115 (inpatient orders for glycemic control are none).    Diet Order:   Diet Order             Diet heart healthy/carb modified Room service appropriate? Yes; Fluid consistency: Thin  Diet effective now                   EDUCATION NEEDS:   No education needs have been identified at this time  Skin:  Skin Assessment: Reviewed RN Assessment  Last BM:  09/04/23  Height:   Ht Readings from Last 1 Encounters:  09/04/23 5' 2.5" (1.588 m)    Weight:   Wt Readings from Last 1 Encounters:  09/04/23 61.2 kg    Ideal Body Weight:  51.1 kg  BMI:  Body mass index is 24.3 kg/m.  Estimated Nutritional Needs:   Kcal:  1650-1850  Protein:  85-100 grams  Fluid:  1.6-1.8 L    Levada Schilling, RD, LDN, CDCES Registered Dietitian III Certified Diabetes Care and Education Specialist If unable to reach this RD, please use "RD Inpatient" group chat on secure chat between hours of 8am-4 pm daily

## 2023-09-05 NOTE — Assessment & Plan Note (Signed)
 Marked weight loss recently - CT pending

## 2023-09-05 NOTE — Assessment & Plan Note (Signed)
 Continue donepezil

## 2023-09-05 NOTE — Assessment & Plan Note (Signed)
-   Supplement potassium

## 2023-09-05 NOTE — Assessment & Plan Note (Signed)
 Mild, asymptomatic, resolved with fluids

## 2023-09-06 ENCOUNTER — Inpatient Hospital Stay (HOSPITAL_COMMUNITY)

## 2023-09-06 DIAGNOSIS — N179 Acute kidney failure, unspecified: Secondary | ICD-10-CM | POA: Diagnosis not present

## 2023-09-06 LAB — BASIC METABOLIC PANEL WITH GFR
Anion gap: 8 (ref 5–15)
BUN: 28 mg/dL — ABNORMAL HIGH (ref 8–23)
CO2: 29 mmol/L (ref 22–32)
Calcium: 9 mg/dL (ref 8.9–10.3)
Chloride: 100 mmol/L (ref 98–111)
Creatinine, Ser: 1.3 mg/dL — ABNORMAL HIGH (ref 0.44–1.00)
GFR, Estimated: 41 mL/min — ABNORMAL LOW (ref >60)
Glucose, Bld: 100 mg/dL — ABNORMAL HIGH (ref 70–99)
Potassium: 3.8 mmol/L (ref 3.5–5.1)
Sodium: 137 mmol/L (ref 135–145)

## 2023-09-06 LAB — CBC
HCT: 32 % — ABNORMAL LOW (ref 36.0–46.0)
Hemoglobin: 10 g/dL — ABNORMAL LOW (ref 12.0–15.0)
MCH: 24.3 pg — ABNORMAL LOW (ref 26.0–34.0)
MCHC: 31.3 g/dL (ref 30.0–36.0)
MCV: 77.7 fL — ABNORMAL LOW (ref 80.0–100.0)
Platelets: 229 10*3/uL (ref 150–400)
RBC: 4.12 MIL/uL (ref 3.87–5.11)
RDW: 16.3 % — ABNORMAL HIGH (ref 11.5–15.5)
WBC: 4.9 10*3/uL (ref 4.0–10.5)
nRBC: 0 % (ref 0.0–0.2)

## 2023-09-06 MED ORDER — METFORMIN HCL 500 MG PO TABS: 500.0000 mg | ORAL_TABLET | Freq: Every day | ORAL | 0 refills | Status: AC

## 2023-09-06 MED ORDER — IRBESARTAN-HYDROCHLOROTHIAZIDE 300-12.5 MG PO TABS: 1.0000 | ORAL_TABLET | Freq: Every day | ORAL | 3 refills | Status: AC

## 2023-09-06 MED ORDER — ENSURE ENLIVE PO LIQD: 237.0000 mL | Freq: Three times a day (TID) | ORAL | 12 refills | Status: AC

## 2023-09-06 MED ORDER — IOHEXOL 300 MG/ML  SOLN
80.0000 mL | Freq: Once | INTRAMUSCULAR | Status: AC | PRN
  Administered 2023-09-06: 80 mL via INTRAVENOUS

## 2023-09-06 MED ORDER — IOHEXOL 9 MG/ML PO SOLN
500.0000 mL | ORAL | Status: AC
  Administered 2023-09-06 (×2): 500 mL via ORAL

## 2023-09-06 NOTE — Plan of Care (Signed)
  Problem: Education: Goal: Knowledge of General Education information will improve Description: Including pain rating scale, medication(s)/side effects and non-pharmacologic comfort measures Outcome: Progressing   Problem: Health Behavior/Discharge Planning: Goal: Ability to manage health-related needs will improve Outcome: Progressing   Problem: Clinical Measurements: Goal: Respiratory complications will improve Outcome: Progressing Goal: Cardiovascular complication will be avoided Outcome: Progressing   Problem: Nutrition: Goal: Adequate nutrition will be maintained Outcome: Progressing   Problem: Safety: Goal: Ability to remain free from injury will improve Outcome: Progressing

## 2023-09-06 NOTE — Discharge Summary (Addendum)
 Physician Discharge Summary  ARIZONA SORN NFA:[id number] DOB: 1941/05/13 DOA: 09/04/2023  PCP: Renaye Rakers, MD  Admit date: 09/04/2023  Discharge date: 09/06/2023  Admitted From:Home  Disposition:  Home  Recommendations for Outpatient Follow-up:  Follow up with PCP in 1-2 weeks Follow-up with oncologist to review CT scan Continue home medications as noted below and may resume metformin as well as lisinopril/HCTZ on 4/7  Home Health: None  Equipment/Devices: None  Discharge Condition:Stable  CODE STATUS: Full  Diet recommendation: Heart Healthy/carb modified  Brief/Interim Summary: 83 y.o. F with dementia lives at home, CAD, HTN, HLD, DM, CKD IIIb baseline 1.1-1.2, hx CVA without residuals, thalassemia minor, multifocal DCIS s/ R mastectomy 2022, in remission, IDA, AVMs, depression, vit D deficiency, chronic cough, bronchiectasis, who presented to the ER for mild upper back pain for a few days.  Her heme-onc recommended outpatient CT, but patient came to ER for expedited imaging.     In the ER, found to have incidental increased creatinine.  And therefore was admitted with AKI on CKD stage IIIb in the setting of possible dehydration as well as transient hypotension.  Creatinine has returned to near baseline with IV fluid hydration and patient underwent CT chest abdomen and pelvis with contrast per her outpatient oncologist.  Note is as below.  She is now in stable condition for discharge with no other acute complaints or concerns noted.  Discharge Diagnoses:  Principal Problem:   AKI (acute kidney injury) (HCC) Active Problems:   Mixed hyperlipidemia   Essential hypertension   CAD S/P percutaneous coronary angioplasty   Cerebrovascular disease   Iron deficiency anemia due to chronic blood loss   Loss of weight   Non-insulin dependent type 2 diabetes mellitus (HCC)   GERD (gastroesophageal reflux disease)   Chronic kidney disease, stage IIIb   Failure to thrive in adult    Hypokalemia   Neurocognitive disorder   Hyponatremia  Principal discharge diagnosis: AKI on CKD stage IIIb.  Failure to thrive.  Discharge Instructions  Discharge Instructions     Diet - low sodium heart healthy   Complete by: As directed    Increase activity slowly   Complete by: As directed       Allergies as of 09/06/2023       Reactions   Protonix [pantoprazole Sodium] Other (See Comments)   DRY LIPS        Medication List     TAKE these medications    amLODipine 10 MG tablet Commonly known as: NORVASC TAKE ONE TABLET BY MOUTH EVERYDAY AT BEDTIME What changed:  how much to take how to take this when to take this additional instructions   aspirin EC 81 MG tablet Take 81 mg by mouth every 4 (four) hours as needed for moderate pain (pain score 4-6) (Pain).   B-12 PO Take 1 tablet by mouth daily.   carvedilol 6.25 MG tablet Commonly known as: COREG TAKE ONE TABLET BY MOUTH BEFORE BREAKFAST and TAKE ONE TABLET BY MOUTH EVERYDAY AT BEDTIME   donepezil 5 MG tablet Commonly known as: ARICEPT Take 1 tablet (5 mg total) by mouth at bedtime.   feeding supplement Liqd Take 237 mLs by mouth 3 (three) times daily between meals.   ferrous sulfate 325 (65 FE) MG EC tablet Take one tablet three times a week   irbesartan-hydrochlorothiazide 300-12.5 MG tablet Commonly known as: AVALIDE Take 1 tablet by mouth daily before breakfast. Start taking on: September 07, 2023   metFORMIN 500 MG  tablet Commonly known as: GLUCOPHAGE Take 1 tablet (500 mg total) by mouth at bedtime. Start taking on: September 07, 2023   rosuvastatin 20 MG tablet Commonly known as: CRESTOR TAKE ONE TABLET BY MOUTH EVERYDAY AT BEDTIME   triamcinolone ointment 0.5 % Commonly known as: KENALOG Apply 1 application  topically 2 (two) times daily as needed (rash).   cholecalciferol 25 MCG (1000 UNIT) tablet Commonly known as: VITAMIN D3 Take 1,000 Units by mouth daily.   Vitamin D3 50 MCG (2000  UT) Tabs TAKE ONE TABLET BY MOUTH daily in addition TO THE 1,000unit FOR total of 3,000units daily        Follow-up Information     Renaye Rakers, MD. Schedule an appointment as soon as possible for a visit in 1 week(s).   Specialty: Family Medicine Contact information: [address] [place], [address] [place] [address] [phone]         [place], [place]. Go to.   Specialty: Oncology Contact information: [address] [place] [address] [phone]                Allergies  Allergen Reactions   Protonix [Pantoprazole Sodium] Other (See Comments)    DRY LIPS    Consultations: None   Procedures/Studies: CT CHEST ABDOMEN PELVIS W CONTRAST Result Date: 09/06/2023 CLINICAL DATA:  Metastatic disease evaluation. Failure to thrive. Unintentional weight loss. EXAM: CT CHEST, ABDOMEN, AND PELVIS WITH CONTRAST TECHNIQUE: Multidetector CT imaging of the chest, abdomen and pelvis was performed following the standard protocol during bolus administration of intravenous contrast. RADIATION DOSE REDUCTION: This exam was performed according to the departmental dose-optimization program which includes automated exposure control, adjustment of the mA and/or kV according to patient size and/or use of iterative reconstruction technique. CONTRAST:  80mL OMNIPAQUE IOHEXOL 300 MG/ML  SOLN COMPARISON:  Chest CT dated 10/20/2019. FINDINGS: CT CHEST FINDINGS Cardiovascular: There is no cardiomegaly or pericardial effusion. Three-vessel coronary vascular calcification. Moderate atherosclerotic calcification of the thoracic aorta. There is no dilatation or dissection. The central pulmonary arteries appear patent. Mediastinum/Nodes: No hilar or mediastinal adenopathy. The esophagus is grossly unremarkable no mediastinal fluid collection. Lungs/Pleura: There are bibasilar linear atelectasis/scarring. Mild bilateral lower lobe bronchiectasis. Patchy area of ground-glass density in the right upper  lobe most consistent with pneumonia. No pleural effusion pneumothorax. The central airways are patent. Musculoskeletal: Right mastectomy. Degenerative changes of the spine. No acute osseous pathology. No suspicious bone lesions. CT ABDOMEN PELVIS FINDINGS No intra-abdominal free air.  Small free fluid in the pelvis. Hepatobiliary: The liver is unremarkable. No biliary dilatation. The gallbladder is unremarkable. Pancreas: Unremarkable. No pancreatic ductal dilatation or surrounding inflammatory changes. Spleen: Normal in size without focal abnormality. Adrenals/Urinary Tract: The adrenal glands are unremarkable. The kidneys, visualized ureters, and urinary bladder appear unremarkable. Stomach/Bowel: Mild thickened appearance of the distal stomach may be related to underdistention. Mild gastritis is less likely but not excluded. Clinical correlation is recommended. Several small scattered distal colonic diverticula. There is no bowel obstruction. The appendix is normal. Vascular/Lymphatic: Moderate aortoiliac atherosclerotic disease. The IVC is unremarkable. No portal venous gas. There is no adenopathy. Reproductive: Hysterectomy.  No suspicious adnexal masses. Other: None Musculoskeletal: Via osteopenia with degenerative changes. Grade 1 L4-L5 anterolisthesis. No acute osseous pathology. No suspicious bone lesions. IMPRESSION: 1. No evidence of metastatic disease. 2. Right upper lobe pneumonia. 3. Underdistention of the distal stomach versus less likely mild gastritis. Electronically Signed   By: Elgie Collard M.D.   On: 09/06/2023  16:46   US RENAL Result Date: 09/05/2023 CLINICAL DATA:  Acute kidney injury. EXAM: RENAL / URINARY TRACT ULTRASOUND COMPLETE COMPARISON:  CT of the abdomen pelvis 07/14/2019. FINDINGS: Right Kidney: Renal measurements: 9.4 x 3.8 x 3.8 cm = volume: 71.4 mL. Renal parenchyma is isoechoic to the index organ, the liver. No mass lesion is present. No obstruction is present. Left Kidney:  Renal measurements: 8.7 x 4.0 x 3.7 cm = volume: 67.2 mL. The renal parenchyma is isoechoic to the index organ, the spleen. No discrete lesion is present. No obstruction is present. Bladder: Appears normal for degree of bladder distention. Other: None. IMPRESSION: 1. No acute or focal lesion to explain the patient's symptoms. 2. Increased echogenicity of the renal parenchyma bilaterally is nonspecific but can be seen in the setting of medical renal disease. Electronically Signed   By: Marin Roberts M.D.   On: 09/05/2023 13:58   DG Chest 2 View Result Date: 09/04/2023 CLINICAL DATA:  Cough.  Shortness of breath. EXAM: CHEST - 2 VIEW COMPARISON:  06/28/2023. FINDINGS: Bilateral lung fields are clear. Bilateral costophrenic angles are clear. Normal cardio-mediastinal silhouette. No acute osseous abnormalities. The soft tissues are within normal limits. IMPRESSION: No active cardiopulmonary disease. Electronically Signed   By: Jules Schick M.D.   On: 09/04/2023 15:17     Discharge Exam: Vitals:   09/06/23 0444 09/06/23 1431  BP: 110/61 124/87  Pulse: 74 80  Resp: 18 16  Temp: 99 F (37.2 C) 97.6 F (36.4 C)  SpO2: 94% 100%   Vitals:   09/05/23 1422 09/05/23 2014 09/06/23 0444 09/06/23 1431  BP: (!) 100/54 112/62 110/61 124/87  Pulse: 73 68 74 80  Resp: 16 18 18 16   Temp: 98.4 F (36.9 C) 98.6 F (37 C) 99 F (37.2 C) 97.6 F (36.4 C)  TempSrc:  Oral Oral Oral  SpO2: 100% 99% 94% 100%  Weight:      Height:        General: Pt is alert, awake, not in acute distress Cardiovascular: RRR, S1/S2 +, no rubs, no gallops Respiratory: CTA bilaterally, no wheezing, no rhonchi Abdominal: Soft, NT, ND, bowel sounds + Extremities: no edema, no cyanosis    The results of significant diagnostics from this hospitalization (including imaging, microbiology, ancillary and laboratory) are listed below for reference.     Microbiology: No results found for this or any previous visit (from  the past 240 hours).   Labs: BNP (last 3 results) No results for input(s): "BNP" in the last 8760 hours. Basic Metabolic Panel: Recent Labs  Lab 09/04/23 1446 09/05/23 0504 09/06/23 0447  NA 131* 138 137  K 3.8 3.2* 3.8  CL 92* 101 100  CO2 28 27 29   GLUCOSE 98 94 100*  BUN 41* 37* 28*  CREATININE 2.24* 1.62* 1.30*  CALCIUM 9.0 8.8* 9.0   Liver Function Tests: Recent Labs  Lab 09/04/23 1446  AST 30  ALT 18  ALKPHOS 41  BILITOT 0.5  PROT 6.5  ALBUMIN 3.6   No results for input(s): "LIPASE", "AMYLASE" in the last 168 hours. No results for input(s): "AMMONIA" in the last 168 hours. CBC: Recent Labs  Lab 09/04/23 1446 09/05/23 0504 09/06/23 0447  WBC 7.2 4.5 4.9  NEUTROABS 5.1  --   --   HGB 10.7* 10.3* 10.0*  HCT 33.7* 31.9* 32.0*  MCV 77.3* 77.1* 77.7*  PLT 236 231 229   Cardiac Enzymes: No results for input(s): "CKTOTAL", "CKMB", "CKMBINDEX", "TROPONINI" in the last  168 hours. BNP: Invalid input(s): "POCBNP" CBG: No results for input(s): "GLUCAP" in the last 168 hours. D-Dimer Recent Labs    09/04/23 1446  DDIMER 0.49   Hgb A1c No results for input(s): "HGBA1C" in the last 72 hours. Lipid Profile No results for input(s): "CHOL", "HDL", "LDLCALC", "TRIG", "CHOLHDL", "LDLDIRECT" in the last 72 hours. Thyroid function studies No results for input(s): "TSH", "T4TOTAL", "T3FREE", "THYROIDAB" in the last 72 hours.  Invalid input(s): "FREET3" Anemia work up No results for input(s): "VITAMINB12", "FOLATE", "FERRITIN", "TIBC", "IRON", "RETICCTPCT" in the last 72 hours. Urinalysis    Component Value Date/Time   COLORURINE STRAW (A) 09/05/2023 1847   APPEARANCEUR CLEAR 09/05/2023 1847   LABSPEC 1.006 09/05/2023 1847   PHURINE 7.0 09/05/2023 1847   GLUCOSEU NEGATIVE 09/05/2023 1847   HGBUR NEGATIVE 09/05/2023 1847   BILIRUBINUR NEGATIVE 09/05/2023 1847   KETONESUR NEGATIVE 09/05/2023 1847   PROTEINUR NEGATIVE 09/05/2023 1847   NITRITE NEGATIVE  09/05/2023 1847   LEUKOCYTESUR NEGATIVE 09/05/2023 1847   Sepsis Labs Recent Labs  Lab 09/04/23 1446 09/05/23 0504 09/06/23 0447  WBC 7.2 4.5 4.9   Microbiology No results found for this or any previous visit (from the past 240 hours).   Time coordinating discharge: 35 minutes  SIGNED:   Erick Blinks, DO [facility] 09/06/2023, 5:10 PM  If 7PM-7AM, please contact night-coverage [email or web address]

## 2023-09-07 ENCOUNTER — Telehealth: Payer: Self-pay | Admitting: *Deleted

## 2023-09-07 NOTE — Telephone Encounter (Signed)
 Per Rojelio Brenner, [facility] called daughter Lupita Leash to notify her of possible pneumonia recognized on IP Ct scan.  Lupe Carney would like for her to contact her PCP asap to notify and treat if necessary.  Per patient, she does have a cough.  Sputum is clear and no fever noted at this time.  Verbalized understanding of instruction.

## 2023-09-11 ENCOUNTER — Encounter (HOSPITAL_COMMUNITY)
Admission: RE | Admit: 2023-09-11 | Discharge: 2023-09-11 | Disposition: A | Discharge status: Home / Self Care | Source: Ambulatory Visit | Attending: Neurology | Admitting: Neurology

## 2023-09-11 DIAGNOSIS — R251 Tremor, unspecified: Secondary | ICD-10-CM | POA: Insufficient documentation

## 2023-09-11 MED ORDER — TECHNETIUM TO 99M ALBUMIN AGGREGATED
4.8000 | Freq: Once | INTRAVENOUS | Status: AC | PRN
  Administered 2023-09-11: 4.8 via INTRAVENOUS

## 2023-09-11 MED ORDER — IOFLUPANE I 123 185 MBQ/2.5ML IV SOLN
4.8000 | Freq: Once | INTRAVENOUS | Status: AC | PRN
  Administered 2023-09-11: 4.8 via INTRAVENOUS
  Filled 2023-09-11: qty 5

## 2023-09-11 MED ORDER — POTASSIUM IODIDE (ANTIDOTE) 130 MG PO TABS
ORAL_TABLET | ORAL | Status: AC
  Filled 2023-09-11: qty 1

## 2023-09-11 NOTE — Progress Notes (Signed)
 This RN along with radiology nursing staff was alerted by radiology registration staff of a fall outside radiology waiting room. This RN along with staff arrived to patient side with wheelchair. Patient was alert and oriented x 4 sitting on her knees, family at her side. Patient states " I was walking and shuffled my feet and fell to floor". Per family fall was less than 3 feet and she caught herself going to ground. No visible injuries at this time. Patient assisted to wheelchair and taking to bay for further evaluation. V/s obtained. Patient denies taking any blood thinners and did not hit her head. Patient denies need for further treatment and will undergo NM Datscan.

## 2023-09-11 NOTE — Progress Notes (Signed)
  IR BRIEF NOTE:  Patient was checking in at Radiology Outpatient desk for scheduled DATScan when she had a witnessed mechanical fall. Patient stumbled while walking, landing on both knees, without hitting her head. Patient was promptly evaluated by nursing staff and this Clinical research associate. Patient is on a daily Aspirin 81 mg.   On exam, patient denied tenderness to palpation throughout either lower extremities. Patient is able to stand, walk, and does not complain of pain. Good motor function 5/5, symmetric sensation bilaterally. No induration, hematoma, bruising. Patient states she did feel lightheaded, and was kept seated in a wheelchair for exam.   Return precautions given to patient and family member at her side. Should patient experience worsening pain, swelling, large bruising, or difficulty walking later today, she should present to urgent care or Emergency department for prom re-evaluation. Patient and family member both voiced understanding and are amenable to this plan.    Electronically Signed: Sable Feil, PA-C 09/11/2023, 9:39 AM

## 2023-09-14 ENCOUNTER — Telehealth: Payer: Self-pay

## 2023-09-14 NOTE — Telephone Encounter (Signed)
-----   Message from [place] sent at 09/14/2023 12:39 PM EDT ----- You can let pt/family know that her DaTscan showed loss of dopamine.  That doesn't diagnose parkinsons as that is a clinical diagnosis.  Dr. Festus Hubert will be back next week and he will give her more instruction on next steps

## 2023-09-14 NOTE — Telephone Encounter (Signed)
Patient daughter advised

## 2023-09-22 ENCOUNTER — Inpatient Hospital Stay: Admitting: Physician Assistant

## 2023-09-25 ENCOUNTER — Ambulatory Visit (HOSPITAL_COMMUNITY)

## 2023-09-25 DIAGNOSIS — E1165 Type 2 diabetes mellitus with hyperglycemia: Secondary | ICD-10-CM | POA: Diagnosis not present

## 2023-09-25 DIAGNOSIS — E649 Sequelae of unspecified nutritional deficiency: Secondary | ICD-10-CM | POA: Diagnosis not present

## 2023-09-25 DIAGNOSIS — R413 Other amnesia: Secondary | ICD-10-CM | POA: Diagnosis not present

## 2023-09-25 DIAGNOSIS — R634 Abnormal weight loss: Secondary | ICD-10-CM | POA: Diagnosis not present

## 2023-09-25 DIAGNOSIS — I10 Essential (primary) hypertension: Secondary | ICD-10-CM | POA: Diagnosis not present
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
## 2023-09-30 ENCOUNTER — Ambulatory Visit (HOSPITAL_COMMUNITY)
Admission: RE | Admit: 2023-09-30 | Discharge: 2023-09-30 | Disposition: A | Discharge status: Home / Self Care | Source: Ambulatory Visit | Attending: Physician Assistant | Admitting: Physician Assistant

## 2023-09-30 ENCOUNTER — Inpatient Hospital Stay: Attending: Physician Assistant | Admitting: Physician Assistant

## 2023-09-30 VITALS — BP 118/87 | HR 80 | Temp 98.0°F | Resp 18 | Wt 127.0 lb

## 2023-09-30 DIAGNOSIS — N6091 Unspecified benign mammary dysplasia of right breast: Secondary | ICD-10-CM | POA: Diagnosis not present

## 2023-09-30 DIAGNOSIS — Z853 Personal history of malignant neoplasm of breast: Secondary | ICD-10-CM | POA: Diagnosis not present

## 2023-09-30 DIAGNOSIS — Z87891 Personal history of nicotine dependence: Secondary | ICD-10-CM | POA: Diagnosis not present

## 2023-09-30 DIAGNOSIS — M8580 Other specified disorders of bone density and structure, unspecified site: Secondary | ICD-10-CM | POA: Diagnosis not present

## 2023-09-30 DIAGNOSIS — D0511 Intraductal carcinoma in situ of right breast: Secondary | ICD-10-CM

## 2023-09-30 DIAGNOSIS — F32A Depression, unspecified: Secondary | ICD-10-CM | POA: Diagnosis not present

## 2023-09-30 DIAGNOSIS — Z1231 Encounter for screening mammogram for malignant neoplasm of breast: Secondary | ICD-10-CM | POA: Diagnosis not present

## 2023-09-30 DIAGNOSIS — Z9011 Acquired absence of right breast and nipple: Secondary | ICD-10-CM | POA: Diagnosis not present

## 2023-09-30 DIAGNOSIS — D509 Iron deficiency anemia, unspecified: Secondary | ICD-10-CM | POA: Diagnosis not present

## 2023-09-30 DIAGNOSIS — R634 Abnormal weight loss: Secondary | ICD-10-CM | POA: Diagnosis not present

## 2023-09-30 DIAGNOSIS — E559 Vitamin D deficiency, unspecified: Secondary | ICD-10-CM | POA: Diagnosis not present

## 2023-09-30 MED ORDER — MIRTAZAPINE 7.5 MG PO TABS
7.5000 mg | ORAL_TABLET | Freq: Every day | ORAL | 3 refills | Status: DC
Start: 2023-09-30 — End: 2024-01-13

## 2023-09-30 NOTE — Patient Instructions (Signed)
 [facility] **VISIT SUMMARY & IMPORTANT INSTRUCTIONS **   You were seen today by Sheril Dines PA-C for your follow-up visit.    WEIGHT LOSS & LOW APPETITE Your CT scan did not show any evidence of cancer. However, you continue to have significant weight loss (down 10 pounds in the past month). Make sure you are eating 3 full sized meals daily. You should also supplement your diet with 2-3 Boost or Ensure daily. Will refer you to the Cancer Center nutritionist. We will check PET scan to evaluate for any hidden cancer in your body. We will see you for weight check and follow-up visit about 1 week after PET scan.  HISTORY OF BREAST CANCER: Will recheck breast exam every 6 months (next due September 2025)  IRON  DEFICIENCY: Continue taking iron  tablet (ferrous sulfate  325 mg) daily.  VITAMIN D  DEFICIENCY: Continue taking vitamin D3 1000 units daily.   ** Thank you for trusting me with your healthcare!  I strive to provide all of my patients with quality care at each visit.  If you receive a survey for this visit, I would be so grateful to you for taking the time to provide feedback.  Thank you in advance!  ~ Shayma Pfefferle                   Dr. Paulett Boros   &   Sheril Dines, PA-C   - - - - - - - - - - - - - - - - - -    Thank you for choosing  [facility] to provide your oncology and hematology care.  To afford each patient quality time with our provider, please arrive at least 15 minutes before your scheduled appointment time.   If you have a lab appointment with the Cancer Center please come in thru the Main Entrance and check in at the main information desk.  You need to re-schedule your appointment should you arrive 10 or more minutes late.  We strive to give you quality time with our providers, and arriving late affects you and other patients whose appointments are after yours.  Also, if you no show  three or more times for appointments you may be dismissed from the clinic at the providers discretion.     Again, thank you for choosing [facility].  Our hope is that these requests will decrease the amount of time that you wait before being seen by our physicians.       _____________________________________________________________  Should you have questions after your visit to [facility], please contact our office at [phone] and follow the prompts.  Our office hours are 8:00 a.m. and 4:30 p.m. Monday - Friday.  Please note that voicemails left after 4:00 p.m. may not be returned until the following business day.  We are closed weekends and major holidays.  You do have access to a nurse 24-7, just call the main number to the clinic [phone] and do not press any options, hold on the line and a nurse will answer the phone.    For prescription refill requests, have your pharmacy contact our office and allow 72 hours.

## 2023-10-01 ENCOUNTER — Ambulatory Visit (HOSPITAL_COMMUNITY)
Admission: RE | Admit: 2023-10-01 | Discharge: 2023-10-01 | Disposition: A | Discharge status: Home / Self Care | Source: Ambulatory Visit | Attending: Physician Assistant | Admitting: Physician Assistant

## 2023-10-01 DIAGNOSIS — Z86000 Personal history of in-situ neoplasm of breast: Secondary | ICD-10-CM | POA: Diagnosis not present

## 2023-10-01 DIAGNOSIS — I700 Atherosclerosis of aorta: Secondary | ICD-10-CM | POA: Diagnosis not present

## 2023-10-01 DIAGNOSIS — N6091 Unspecified benign mammary dysplasia of right breast: Secondary | ICD-10-CM | POA: Diagnosis not present

## 2023-10-01 DIAGNOSIS — Z9011 Acquired absence of right breast and nipple: Secondary | ICD-10-CM | POA: Insufficient documentation

## 2023-10-01 DIAGNOSIS — M4316 Spondylolisthesis, lumbar region: Secondary | ICD-10-CM | POA: Diagnosis not present

## 2023-10-01 DIAGNOSIS — R935 Abnormal findings on diagnostic imaging of other abdominal regions, including retroperitoneum: Secondary | ICD-10-CM | POA: Diagnosis not present

## 2023-10-01 DIAGNOSIS — R634 Abnormal weight loss: Secondary | ICD-10-CM | POA: Insufficient documentation

## 2023-10-01 DIAGNOSIS — C50911 Malignant neoplasm of unspecified site of right female breast: Secondary | ICD-10-CM | POA: Diagnosis not present

## 2023-10-01 DIAGNOSIS — D0511 Intraductal carcinoma in situ of right breast: Secondary | ICD-10-CM

## 2023-10-01 MED ORDER — FLUDEOXYGLUCOSE F - 18 (FDG) INJECTION
6.4300 | Freq: Once | INTRAVENOUS | Status: AC | PRN
  Administered 2023-10-01: 6.43 via INTRAVENOUS
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
## 2023-10-05 ENCOUNTER — Other Ambulatory Visit: Payer: Self-pay | Admitting: Physician Assistant

## 2023-10-05 ENCOUNTER — Encounter: Payer: Self-pay | Admitting: Hematology

## 2023-10-05 ENCOUNTER — Inpatient Hospital Stay: Attending: Physician Assistant | Admitting: Physician Assistant

## 2023-10-05 VITALS — BP 140/79 | HR 81 | Temp 97.7°F | Resp 18 | Ht 62.5 in | Wt 127.0 lb

## 2023-10-05 DIAGNOSIS — R590 Localized enlarged lymph nodes: Secondary | ICD-10-CM | POA: Diagnosis not present

## 2023-10-05 DIAGNOSIS — E559 Vitamin D deficiency, unspecified: Secondary | ICD-10-CM | POA: Insufficient documentation

## 2023-10-05 DIAGNOSIS — R6889 Other general symptoms and signs: Secondary | ICD-10-CM

## 2023-10-05 DIAGNOSIS — D509 Iron deficiency anemia, unspecified: Secondary | ICD-10-CM | POA: Insufficient documentation

## 2023-10-05 DIAGNOSIS — Z87891 Personal history of nicotine dependence: Secondary | ICD-10-CM | POA: Diagnosis not present

## 2023-10-05 DIAGNOSIS — Z9011 Acquired absence of right breast and nipple: Secondary | ICD-10-CM | POA: Diagnosis not present

## 2023-10-05 DIAGNOSIS — F32A Depression, unspecified: Secondary | ICD-10-CM | POA: Diagnosis not present

## 2023-10-05 DIAGNOSIS — D0511 Intraductal carcinoma in situ of right breast: Secondary | ICD-10-CM | POA: Diagnosis not present

## 2023-10-05 DIAGNOSIS — R5381 Other malaise: Secondary | ICD-10-CM

## 2023-10-05 NOTE — Progress Notes (Signed)
 Referral for home health PT placed, per request of patient's family.  Sonnie Dusky, PA-C 10/05/23 8:43 PM

## 2023-10-08 ENCOUNTER — Encounter: Payer: Self-pay | Admitting: Surgery

## 2023-10-08 ENCOUNTER — Ambulatory Visit: Admitting: Surgery

## 2023-10-08 VITALS — BP 130/81 | HR 82 | Temp 97.6°F | Resp 12 | Ht 62.5 in | Wt 129.0 lb

## 2023-10-08 DIAGNOSIS — R591 Generalized enlarged lymph nodes: Secondary | ICD-10-CM | POA: Diagnosis not present

## 2023-10-09 NOTE — Progress Notes (Unsigned)
 [facility] History and Physical  Reason for Referral: Left inguinal lymph node biopsy Referring Physician: Oncology  Chief Complaint   New Patient (Initial Visit)     Nancy Liu is a 83 y.o. female.  HPI: Patient presents for evaluation of a left inguinal lymph node.  She had experienced 30 pounds of weight loss over the last year, so she followed up with oncology who recommended a PET scan.  On PET scan, she was noted to have a 1.2 cm left inguinal lymph node that was lighting up.  She denies any lumps or bumps anywhere on her body.  She has a personal history of right breast DCIS, for which she underwent a right breast mastectomy with mag trace injection.  She never underwent a sentinel lymph node biopsy on that side.  Her final pathology demonstrated DCIS without evidence of carcinoma.  She denies any history of skin cancer or other personal history of cancers.  Her past medical history is significant for coronary artery disease status post stent placement, hypertension, and diabetes.  She denies use of blood thinning medications.  Her surgical history is significant for the right breast mastectomy and hysterectomy.  She denies use of tobacco products, alcohol, and illicit drugs.  Past Medical History:  Diagnosis Date   Arteriosclerotic cardiovascular disease (ASCVD) 2001   Bare-metal stent placed in the right coronary artery in 12/01; residual 50% lesion of the first diagonal and mid LAD   Breast cancer (HCC)    Cerebrovascular disease    COPD (chronic obstructive pulmonary disease) (HCC)    Cyst, dermoid, arm, left 03/26/2018   Diabetes mellitus    excellent control with a low-dose of a single oral agent   Family history of breast cancer 01/23/2021   Family history of ovarian cancer 01/23/2021   GERD (gastroesophageal reflux disease)    with ulcers   History of right coronary artery stent placement 2000   Hyperlipidemia    Hypertension    Sleep apnea     Thalassemia minor    Tobacco abuse, in remission    35 pack years; Quit in 1980    Past Surgical History:  Procedure Laterality Date   BIOPSY  02/28/2020   Procedure: BIOPSY;  Surgeon: Vinetta Greening, DO;  [facility];  Service: Endoscopy;;   COLONOSCOPY  2006   Dr. Tova Fresh: normal   COLONOSCOPY  2000   Dr. Carmelita Ching: normal    COLONOSCOPY N/A 03/03/2016   Dr. Nolene Baumgarten: diverticulosis, non-bleeding hemorrhoids, redundant left colon.   DILATION AND CURETTAGE OF UTERUS     ESOPHAGOGASTRODUODENOSCOPY  2000   Dr. Carmelita Ching: gastritis    ESOPHAGOGASTRODUODENOSCOPY  2006   Dr. Tova Fresh: small hiatal hernia, gastritis, negative H.pylori    ESOPHAGOGASTRODUODENOSCOPY N/A 03/03/2016   Procedure: ESOPHAGOGASTRODUODENOSCOPY (EGD);  Surgeon: Alyce Jubilee, MD;  [facility]: [facility];  Service: Endoscopy;  Laterality: N/A;   ESOPHAGOGASTRODUODENOSCOPY N/A 06/03/2016   Dr. Nolene Baumgarten: nonaggressive gastritis due to aspirin  use. Previous ulcers had healed.   ESOPHAGOGASTRODUODENOSCOPY (EGD) WITH PROPOFOL  N/A 02/28/2020   focal inflammation in the gastric antrum consistent with gastritis on biopsies. No H.pylori.    EXCISION OF KELOID Left 03/26/2018   Procedure: EXCISION OF LEFT ARM MASS;  Surgeon: Boyce Byes, MD;  Location: [facility];  Service: General;  Laterality: Left;   GIVENS CAPSULE STUDY N/A 10/10/2020   Procedure: GIVENS CAPSULE STUDY;  Surgeon: Vinetta Greening, DO;  [facility];  Service: Endoscopy;  Laterality: N/A;  7:30am   INGUINAL HERNIA REPAIR  1970s   Right   TOTAL MASTECTOMY Right 02/20/2021   Procedure: RIGHT TOTAL MASTECTOMY;  Surgeon: Enid Harry, MD;  Location: [facility];  Service: General;  Laterality: Right;   VAGINAL HYSTERECTOMY  1980   Unilateral oophorectomy    Family History  Problem Relation Age of Onset   Heart disease Mother        also hypertension and asthma   Lung cancer Mother        dx 69s   Ovarian cancer Mother        dx  before 55   Alzheimer's disease Maternal Grandmother    Diabetes Half-Sister    Breast cancer Half-Sister 36   Multiple myeloma Nephew 53       war-related exposures   Colon cancer Neg Hx    Colon polyps Neg Hx     Social History   Tobacco Use   Smoking status: Former    Current packs/day: 0.00    Average packs/day: 1 pack/day for 18.0 years (18.0 ttl pk-yrs)    Types: Cigarettes    Start date: 06/02/1965    Quit date: 06/09/1980    Years since quitting: 43.3   Smokeless tobacco: Never  Vaping Use   Vaping status: Never Used  Substance Use Topics   Alcohol use: No    Alcohol/week: 0.0 standard drinks of alcohol   Drug use: No    Medications: I have reviewed the patient's current medications. Allergies as of 10/08/2023       Reactions   Protonix  [pantoprazole  Sodium] Other (See Comments)   DRY LIPS        Medication List        Accurate as of Oct 08, 2023 11:59 PM. If you have any questions, ask your nurse or doctor.          amLODipine  10 MG tablet Commonly known as: NORVASC  TAKE ONE TABLET BY MOUTH EVERYDAY AT BEDTIME What changed:  how much to take how to take this when to take this additional instructions   aspirin  EC 81 MG tablet Take 81 mg by mouth every 4 (four) hours as needed for moderate pain (pain score 4-6) (Pain).   B-12 PO Take 1 tablet by mouth daily.   carvedilol  6.25 MG tablet Commonly known as: COREG  TAKE ONE TABLET BY MOUTH BEFORE BREAKFAST and TAKE ONE TABLET BY MOUTH EVERYDAY AT BEDTIME   donepezil  5 MG tablet Commonly known as: ARICEPT  Take 1 tablet (5 mg total) by mouth at bedtime.   feeding supplement Liqd Take 237 mLs by mouth 3 (three) times daily between meals.   ferrous sulfate  325 (65 FE) MG EC tablet Take one tablet three times a week   irbesartan -hydrochlorothiazide  300-12.5 MG tablet Commonly known as: AVALIDE Take 1 tablet by mouth daily before breakfast.   metFORMIN  500 MG tablet Commonly known as:  GLUCOPHAGE  Take 1 tablet (500 mg total) by mouth at bedtime.   mirtazapine  7.5 MG tablet Commonly known as: REMERON  Take 1 tablet (7.5 mg total) by mouth at bedtime. For depression and weight loss.   rosuvastatin  20 MG tablet Commonly known as: CRESTOR  TAKE ONE TABLET BY MOUTH EVERYDAY AT BEDTIME   triamcinolone ointment 0.5 % Commonly known as: KENALOG Apply 1 application  topically 2 (two) times daily as needed (rash).   cholecalciferol 25 MCG (1000 UNIT) tablet Commonly known as: VITAMIN D3 Take 1,000 Units by mouth daily.   Vitamin D3 50  MCG (2000 UT) Tabs TAKE ONE TABLET BY MOUTH daily in addition TO THE 1,000unit FOR total of 3,000units daily         ROS:  Constitutional: negative for chills, fatigue, and fevers Eyes: positive for visual disturbance Ears, nose, mouth, throat, and face: negative for ear drainage, sore throat, and sinus problems Respiratory: positive for cough, negative for wheezing and shortness of breath Cardiovascular: negative for chest pain and palpitations Gastrointestinal: positive for reflux symptoms, negative for abdominal pain, nausea, and vomiting Genitourinary:negative for dysuria and frequency Integument/breast: positive for dryness and rash Hematologic/lymphatic: positive for lymphadenopathy Musculoskeletal:negative for back pain and neck pain Neurological: positive for dizziness and tremors Endocrine: negative for temperature intolerance  Blood pressure 130/81, pulse 82, temperature 97.6 F (36.4 C), temperature source Oral, resp. rate 12, height 5' 2.5" (1.588 m), weight 129 lb (58.5 kg), SpO2 97%. Physical Exam Vitals reviewed.  Constitutional:      Appearance: Normal appearance.  HENT:     Head: Normocephalic and atraumatic.  Eyes:     Extraocular Movements: Extraocular movements intact.     Pupils: Pupils are equal, round, and reactive to light.  Cardiovascular:     Rate and Rhythm: Normal rate and regular rhythm.   Pulmonary:     Effort: Pulmonary effort is normal.     Breath sounds: Normal breath sounds.  Abdominal:     Comments: Abdomen soft, nondistended, no percussion tenderness, nontender to palpation; no rigidity, guarding, rebound tenderness; palpable lymphadenopathy in the left groin correlating with visualized enlarged lymph node on PET scan; no right inguinal lymphadenopathy palpable  Musculoskeletal:        General: Normal range of motion.     Cervical back: Normal range of motion.  Skin:    General: Skin is warm and dry.  Neurological:     General: No focal deficit present.     Mental Status: She is alert and oriented to person, place, and time.  Psychiatric:        Mood and Affect: Mood normal.        Behavior: Behavior normal.     Results: CT chest, abdomen and pelvis (09/06/2023): IMPRESSION: 1. No evidence of metastatic disease. 2. Right upper lobe pneumonia. 3. Underdistention of the distal stomach versus less likely mild gastritis.  PET scan (10/01/2023): IMPRESSION: 1. Hypermetabolic left inguinal lymph node, maximum SUV 9.2. Given the high SUV, neoplastic involvement is a distinct possibility tissue sampling should be considered. 2. Multifocal accentuated activity in the colon is likely physiologic given the lack of correlate of CT findings, although technically nonspecific. 3. Right mastectomy. 4. Coronary, aortic arch, systemic, and branch vessel atherosclerotic vascular disease. Aortic Atherosclerosis (ICD10-I70.0). 5. Grade 1 anterolisthesis at L4-5.  Assessment & Plan:  Nancy Liu is a 83 y.o. female who presents for evaluation of a left inguinal lymph node.  -We discussed her imaging findings, and her physical exam findings.  Given that the left inguinal lymph node is lighting up on PET scan in addition to her recent weight loss, I recommend that we excise this lymph node in the operating room -The risk and benefits of left inguinal lymph node excision  were discussed including but not limited to bleeding, infection, injury to surrounding structures, need for additional procedures, seroma, and lymphocele.  After careful consideration, Nancy Liu has decided to proceed with surgery.  -We discussed that while I am able to palpate the lymph node on examination, there is always a possibility that I am  may not remove the area of concern at the time of surgery.  If this occurs, she may need to undergo image guided localization study with a radiofrequency localizer that can be used at the time of surgery to assist with removal of the lymph node of concern -Patient tentatively scheduled for surgery on 5/21 -Information provided to the patient regarding lymph node biopsy  All questions were answered to the satisfaction of the patient and family.  Note: Portions of this report may have been transcribed using voice recognition software. Every effort has been made to ensure accuracy; however, inadvertent computerized transcription errors may still be present.   Lidia Reels, DO [facility] [address]low [place], [place] [address] [phone] (office)

## 2023-10-12 ENCOUNTER — Ambulatory Visit: Admitting: Physician Assistant

## 2023-10-12 ENCOUNTER — Inpatient Hospital Stay: Admitting: Dietician

## 2023-10-12 NOTE — Progress Notes (Signed)
 Nutrition Assessment   Reason for Assessment: Referral   ASSESSMENT: 83 year old female under surveillance for breast cancer and IDA with concerning left inguinal lymph node on recent imaging. Planning for surgical excision 5/21 under the care of Dr. Cherilyn Corn. Patient is followed by Dr. Orvis Blare  Past medical history includes HTN, CAD, AVM, GERD, DM2, neurocognitive disorder, HLD, CKD3, DCIS s/p right mastectomy  Spoke with patient via telephone. Patient reports poor appetite over the last few months. "She just doesn't want anything." This is concerning as "nothing hurts" or gives reason for anorexia. She has home health that assists with meals. Patient eats well at breakfast. Recalls cheese eggs, slice of bacon, oatmeal, and applesauce every morning. Sometimes will have cereal instead of oatmeal. Patient often does not eat lunch. Had chicken and rice soup last night. She drinks 2 Boost - one with breakfast and the other in the evening. Patient drinks a lot of water . She denies nausea, vomiting, diarrhea. Reports chronic constipation. Sometimes "bowels don't move for 2 weeks." Patient takes miralax  as needed. Bowels having been moving good the last 2 days.   Nutrition Focused Physical Exam: unable to complete (telephone visit)   Medications: amlodipine , coreg , D3, B12, aricept , ferrous sulfate , avalide, metformin , remeron , crestor    Labs: 4/6 labs reviewed    Anthropometrics:   Height: 5'2.5" Weight: 129 lb (5/8) UBW: 150 lb (02/2023) BMI: 23.22   NUTRITION DIAGNOSIS: Unintended wt loss related to cancer as evidenced by decreased appetite, 12% wt loss in 4 weeks (1/7 - 147 lb) which is severe for time frame   INTERVENTION:  Encourage large breakfast as this is when she is the most hungry  Recommend small frequent meals/snacks q2-3 h after breakfast  Encourage soft moist textures high in calories and protein for ease of intake Continue drinking Boost Plus/equivalent - suggested  splitting 2 Boost over 4 servings in between meals/snacks Recommend miralax  if no BM x3 days Handouts with snack ideas, protein foods, tips for poor appetite, coupons, contact information mailed    MONITORING, EVALUATION, GOAL: Pt will tolerate increased calories and protein to minimize further wt loss    Next Visit: Monday June 2 via telephone

## 2023-10-12 NOTE — H&P (Signed)
 [facility] History and Physical  Reason for Referral: Left inguinal lymph node biopsy Referring Physician: Oncology  Chief Complaint   New Patient (Initial Visit)     Nancy Liu is a 83 y.o. female.  HPI: Patient presents for evaluation of a left inguinal lymph node.  She had experienced 30 pounds of weight loss over the last year, so she followed up with oncology who recommended a PET scan.  On PET scan, she was noted to have a 1.2 cm left inguinal lymph node that was lighting up.  She denies any lumps or bumps anywhere on her body.  She has a personal history of right breast DCIS, for which she underwent a right breast mastectomy with mag trace injection.  She never underwent a sentinel lymph node biopsy on that side.  Her final pathology demonstrated DCIS without evidence of carcinoma.  She denies any history of skin cancer or other personal history of cancers.  Her past medical history is significant for coronary artery disease status post stent placement, hypertension, and diabetes.  She denies use of blood thinning medications.  Her surgical history is significant for the right breast mastectomy and hysterectomy.  She denies use of tobacco products, alcohol, and illicit drugs.  Past Medical History:  Diagnosis Date   Arteriosclerotic cardiovascular disease (ASCVD) 2001   Bare-metal stent placed in the right coronary artery in 12/01; residual 50% lesion of the first diagonal and mid LAD   Breast cancer (HCC)    Cerebrovascular disease    COPD (chronic obstructive pulmonary disease) (HCC)    Cyst, dermoid, arm, left 03/26/2018   Diabetes mellitus    excellent control with a low-dose of a single oral agent   Family history of breast cancer 01/23/2021   Family history of ovarian cancer 01/23/2021   GERD (gastroesophageal reflux disease)    with ulcers   History of right coronary artery stent placement 2000   Hyperlipidemia    Hypertension    Sleep apnea     Thalassemia minor    Tobacco abuse, in remission    35 pack years; Quit in 1980    Past Surgical History:  Procedure Laterality Date   BIOPSY  02/28/2020   Procedure: BIOPSY;  Surgeon: Vinetta Greening, DO;  [facility];  Service: Endoscopy;;   COLONOSCOPY  2006   Dr. Tova Fresh: normal   COLONOSCOPY  2000   Dr. Carmelita Ching: normal    COLONOSCOPY N/A 03/03/2016   Dr. Nolene Baumgarten: diverticulosis, non-bleeding hemorrhoids, redundant left colon.   DILATION AND CURETTAGE OF UTERUS     ESOPHAGOGASTRODUODENOSCOPY  2000   Dr. Carmelita Ching: gastritis    ESOPHAGOGASTRODUODENOSCOPY  2006   Dr. Tova Fresh: small hiatal hernia, gastritis, negative H.pylori    ESOPHAGOGASTRODUODENOSCOPY N/A 03/03/2016   Procedure: ESOPHAGOGASTRODUODENOSCOPY (EGD);  Surgeon: Alyce Jubilee, MD;  [facility];  Service: Endoscopy;  Laterality: N/A;   ESOPHAGOGASTRODUODENOSCOPY N/A 06/03/2016   Dr. Nolene Baumgarten: nonaggressive gastritis due to aspirin  use. Previous ulcers had healed.   ESOPHAGOGASTRODUODENOSCOPY (EGD) WITH PROPOFOL  N/A 02/28/2020   focal inflammation in the gastric antrum consistent with gastritis on biopsies. No H.pylori.    EXCISION OF KELOID Left 03/26/2018   Procedure: EXCISION OF LEFT ARM MASS;  Surgeon: Boyce Byes, MD;  Location: [facility];  Service: General;  Laterality: Left;   GIVENS CAPSULE STUDY N/A 10/10/2020   Procedure: GIVENS CAPSULE STUDY;  Surgeon: Vinetta Greening, DO;  [facility];  Service: Endoscopy;  Laterality: N/A;  7:30am   INGUINAL HERNIA REPAIR  1970s   Right   TOTAL MASTECTOMY Right 02/20/2021   Procedure: RIGHT TOTAL MASTECTOMY;  Surgeon: Enid Harry, MD;  Location: [facility];  Service: General;  Laterality: Right;   VAGINAL HYSTERECTOMY  1980   Unilateral oophorectomy    Family History  Problem Relation Age of Onset   Heart disease Mother        also hypertension and asthma   Lung cancer Mother        dx 13s   Ovarian cancer Mother        dx  before 42   Alzheimer's disease Maternal Grandmother    Diabetes Half-Sister    Breast cancer Half-Sister 67   Multiple myeloma Nephew 53       war-related exposures   Colon cancer Neg Hx    Colon polyps Neg Hx     Social History   Tobacco Use   Smoking status: Former    Current packs/day: 0.00    Average packs/day: 1 pack/day for 18.0 years (18.0 ttl pk-yrs)    Types: Cigarettes    Start date: 06/02/1965    Quit date: 06/09/1980    Years since quitting: 43.3   Smokeless tobacco: Never  Vaping Use   Vaping status: Never Used  Substance Use Topics   Alcohol use: No    Alcohol/week: 0.0 standard drinks of alcohol   Drug use: No    Medications: I have reviewed the patient's current medications. Allergies as of 10/08/2023       Reactions   Protonix  [pantoprazole  Sodium] Other (See Comments)   DRY LIPS        Medication List        Accurate as of Oct 08, 2023 11:59 PM. If you have any questions, ask your nurse or doctor.          amLODipine  10 MG tablet Commonly known as: NORVASC  TAKE ONE TABLET BY MOUTH EVERYDAY AT BEDTIME What changed:  how much to take how to take this when to take this additional instructions   aspirin  EC 81 MG tablet Take 81 mg by mouth every 4 (four) hours as needed for moderate pain (pain score 4-6) (Pain).   B-12 PO Take 1 tablet by mouth daily.   carvedilol  6.25 MG tablet Commonly known as: COREG  TAKE ONE TABLET BY MOUTH BEFORE BREAKFAST and TAKE ONE TABLET BY MOUTH EVERYDAY AT BEDTIME   donepezil  5 MG tablet Commonly known as: ARICEPT  Take 1 tablet (5 mg total) by mouth at bedtime.   feeding supplement Liqd Take 237 mLs by mouth 3 (three) times daily between meals.   ferrous sulfate  325 (65 FE) MG EC tablet Take one tablet three times a week   irbesartan -hydrochlorothiazide  300-12.5 MG tablet Commonly known as: AVALIDE Take 1 tablet by mouth daily before breakfast.   metFORMIN  500 MG tablet Commonly known as:  GLUCOPHAGE  Take 1 tablet (500 mg total) by mouth at bedtime.   mirtazapine  7.5 MG tablet Commonly known as: REMERON  Take 1 tablet (7.5 mg total) by mouth at bedtime. For depression and weight loss.   rosuvastatin  20 MG tablet Commonly known as: CRESTOR  TAKE ONE TABLET BY MOUTH EVERYDAY AT BEDTIME   triamcinolone ointment 0.5 % Commonly known as: KENALOG Apply 1 application  topically 2 (two) times daily as needed (rash).   cholecalciferol 25 MCG (1000 UNIT) tablet Commonly known as: VITAMIN D3 Take 1,000 Units by mouth daily.   Vitamin D3 50  MCG (2000 UT) Tabs TAKE ONE TABLET BY MOUTH daily in addition TO THE 1,000unit FOR total of 3,000units daily         ROS:  Constitutional: negative for chills, fatigue, and fevers Eyes: positive for visual disturbance Ears, nose, mouth, throat, and face: negative for ear drainage, sore throat, and sinus problems Respiratory: positive for cough, negative for wheezing and shortness of breath Cardiovascular: negative for chest pain and palpitations Gastrointestinal: positive for reflux symptoms, negative for abdominal pain, nausea, and vomiting Genitourinary:negative for dysuria and frequency Integument/breast: positive for dryness and rash Hematologic/lymphatic: positive for lymphadenopathy Musculoskeletal:negative for back pain and neck pain Neurological: positive for dizziness and tremors Endocrine: negative for temperature intolerance  Blood pressure 130/81, pulse 82, temperature 97.6 F (36.4 C), temperature source Oral, resp. rate 12, height 5' 2.5" (1.588 m), weight 129 lb (58.5 kg), SpO2 97%. Physical Exam Vitals reviewed.  Constitutional:      Appearance: Normal appearance.  HENT:     Head: Normocephalic and atraumatic.  Eyes:     Extraocular Movements: Extraocular movements intact.     Pupils: Pupils are equal, round, and reactive to light.  Cardiovascular:     Rate and Rhythm: Normal rate and regular rhythm.   Pulmonary:     Effort: Pulmonary effort is normal.     Breath sounds: Normal breath sounds.  Abdominal:     Comments: Abdomen soft, nondistended, no percussion tenderness, nontender to palpation; no rigidity, guarding, rebound tenderness; palpable lymphadenopathy in the left groin correlating with visualized enlarged lymph node on PET scan; no right inguinal lymphadenopathy palpable  Musculoskeletal:        General: Normal range of motion.     Cervical back: Normal range of motion.  Skin:    General: Skin is warm and dry.  Neurological:     General: No focal deficit present.     Mental Status: She is alert and oriented to person, place, and time.  Psychiatric:        Mood and Affect: Mood normal.        Behavior: Behavior normal.     Results: CT chest, abdomen and pelvis (09/06/2023): IMPRESSION: 1. No evidence of metastatic disease. 2. Right upper lobe pneumonia. 3. Underdistention of the distal stomach versus less likely mild gastritis.  PET scan (10/01/2023): IMPRESSION: 1. Hypermetabolic left inguinal lymph node, maximum SUV 9.2. Given the high SUV, neoplastic involvement is a distinct possibility tissue sampling should be considered. 2. Multifocal accentuated activity in the colon is likely physiologic given the lack of correlate of CT findings, although technically nonspecific. 3. Right mastectomy. 4. Coronary, aortic arch, systemic, and branch vessel atherosclerotic vascular disease. Aortic Atherosclerosis (ICD10-I70.0). 5. Grade 1 anterolisthesis at L4-5.  Assessment & Plan:  Nancy Liu is a 83 y.o. female who presents for evaluation of a left inguinal lymph node.  -We discussed her imaging findings, and her physical exam findings.  Given that the left inguinal lymph node is lighting up on PET scan in addition to her recent weight loss, I recommend that we excise this lymph node in the operating room -The risk and benefits of left inguinal lymph node excision  were discussed including but not limited to bleeding, infection, injury to surrounding structures, need for additional procedures, seroma, and lymphocele.  After careful consideration, Nancy Liu has decided to proceed with surgery.  -We discussed that while I am able to palpate the lymph node on examination, there is always a possibility that I am  may not remove the area of concern at the time of surgery.  If this occurs, she may need to undergo image guided localization study with a radiofrequency localizer that can be used at the time of surgery to assist with removal of the lymph node of concern -Patient tentatively scheduled for surgery on 5/21 -Information provided to the patient regarding lymph node biopsy  All questions were answered to the satisfaction of the patient and family.  Note: Portions of this report may have been transcribed using voice recognition software. Every effort has been made to ensure accuracy; however, inadvertent computerized transcription errors may still be present.   Lidia Reels, DO [facility] [address] [place], [place] [address] [phone] (office)

## 2023-10-14 DIAGNOSIS — D509 Iron deficiency anemia, unspecified: Secondary | ICD-10-CM | POA: Diagnosis not present

## 2023-10-14 DIAGNOSIS — F03918 Unspecified dementia, unspecified severity, with other behavioral disturbance: Secondary | ICD-10-CM | POA: Diagnosis not present

## 2023-10-14 DIAGNOSIS — Z853 Personal history of malignant neoplasm of breast: Secondary | ICD-10-CM | POA: Diagnosis not present

## 2023-10-14 DIAGNOSIS — Z556 Problems related to health literacy: Secondary | ICD-10-CM | POA: Diagnosis not present

## 2023-10-14 DIAGNOSIS — F32A Depression, unspecified: Secondary | ICD-10-CM | POA: Diagnosis not present

## 2023-10-14 DIAGNOSIS — E1122 Type 2 diabetes mellitus with diabetic chronic kidney disease: Secondary | ICD-10-CM | POA: Diagnosis not present

## 2023-10-14 DIAGNOSIS — E559 Vitamin D deficiency, unspecified: Secondary | ICD-10-CM | POA: Diagnosis not present

## 2023-10-14 DIAGNOSIS — E782 Mixed hyperlipidemia: Secondary | ICD-10-CM | POA: Diagnosis not present

## 2023-10-14 DIAGNOSIS — Z955 Presence of coronary angioplasty implant and graft: Secondary | ICD-10-CM | POA: Diagnosis not present

## 2023-10-14 DIAGNOSIS — Z87891 Personal history of nicotine dependence: Secondary | ICD-10-CM | POA: Diagnosis not present

## 2023-10-14 DIAGNOSIS — D563 Thalassemia minor: Secondary | ICD-10-CM | POA: Diagnosis not present

## 2023-10-14 DIAGNOSIS — I2510 Atherosclerotic heart disease of native coronary artery without angina pectoris: Secondary | ICD-10-CM | POA: Diagnosis not present

## 2023-10-14 DIAGNOSIS — Z8673 Personal history of transient ischemic attack (TIA), and cerebral infarction without residual deficits: Secondary | ICD-10-CM | POA: Diagnosis not present

## 2023-10-14 DIAGNOSIS — R634 Abnormal weight loss: Secondary | ICD-10-CM | POA: Diagnosis not present

## 2023-10-14 DIAGNOSIS — N1831 Chronic kidney disease, stage 3a: Secondary | ICD-10-CM | POA: Diagnosis not present

## 2023-10-14 DIAGNOSIS — C774 Secondary and unspecified malignant neoplasm of inguinal and lower limb lymph nodes: Secondary | ICD-10-CM | POA: Diagnosis not present

## 2023-10-14 DIAGNOSIS — I10 Essential (primary) hypertension: Secondary | ICD-10-CM | POA: Diagnosis not present

## 2023-10-14 DIAGNOSIS — G4730 Sleep apnea, unspecified: Secondary | ICD-10-CM | POA: Diagnosis not present

## 2023-10-14 DIAGNOSIS — C969 Malignant neoplasm of lymphoid, hematopoietic and related tissue, unspecified: Secondary | ICD-10-CM | POA: Diagnosis not present

## 2023-10-14 DIAGNOSIS — Z9011 Acquired absence of right breast and nipple: Secondary | ICD-10-CM | POA: Diagnosis not present

## 2023-10-14 DIAGNOSIS — K219 Gastro-esophageal reflux disease without esophagitis: Secondary | ICD-10-CM | POA: Diagnosis not present

## 2023-10-14 DIAGNOSIS — J4489 Other specified chronic obstructive pulmonary disease: Secondary | ICD-10-CM | POA: Diagnosis not present

## 2023-10-14 DIAGNOSIS — Z6823 Body mass index (BMI) 23.0-23.9, adult: Secondary | ICD-10-CM | POA: Diagnosis not present

## 2023-10-14 DIAGNOSIS — Z7984 Long term (current) use of oral hypoglycemic drugs: Secondary | ICD-10-CM | POA: Diagnosis not present

## 2023-10-14 DIAGNOSIS — F0393 Unspecified dementia, unspecified severity, with mood disturbance: Secondary | ICD-10-CM | POA: Diagnosis not present

## 2023-10-14 DIAGNOSIS — C569 Malignant neoplasm of unspecified ovary: Secondary | ICD-10-CM | POA: Diagnosis not present

## 2023-10-14 DIAGNOSIS — I129 Hypertensive chronic kidney disease with stage 1 through stage 4 chronic kidney disease, or unspecified chronic kidney disease: Secondary | ICD-10-CM | POA: Diagnosis not present

## 2023-10-16 NOTE — Patient Instructions (Signed)
 Nancy Liu  10/16/2023     @PREFPERIOPPHARMACY @   Your procedure is scheduled on 10/21/2023.   Report to [facility] at 6:00 A.M.   Call this number if you have problems the morning of surgery:  [phone]  If you experience any cold or flu symptoms such as cough, fever, chills, shortness of breath, etc. between now and your scheduled surgery, please notify us  at the above number.   Remember:   Do not eat  after midnight.  You may drink clear liquids until 3:30 AM .  Clear liquids allowed are:                    Water , Juice (No red color; non-citric and without pulp; diabetics please choose diet or no sugar options), Carbonated beverages (diabetics please choose diet or no sugar options), Black Coffee Only (No creamer, milk or cream, including half & half and powdered creamer), and Clear Sports drink (No red color; diabetics please choose diet or no sugar options)    Take these medicines the morning of surgery with A SIP OF WATER         Do not wear jewelry, make-up or nail polish, including gel polish,  artificial nails, or any other type of covering on natural nails (fingers and  toes).  Do not wear lotions, powders, or perfumes, or deodorant.  Do not shave 48 hours prior to surgery.  Men may shave face and neck.  Do not bring valuables to the hospital.  [facility] is not responsible for any belongings or valuables.  Contacts, dentures or bridgework may not be worn into surgery.  Leave your suitcase in the car.  After surgery it may be brought to your room.  For patients admitted to the hospital, discharge time will be determined by your treatment team.  Patients discharged the day of surgery will not be allowed to drive home.   Name and phone number of your driver:   family Special instructions:  N/A  Please read over the following fact sheets that you were given. Care and Recovery After Surgery     General Anesthesia, Adult General anesthesia is the use  of medicine to make you fall asleep (unconscious) for a medical procedure. General anesthesia must be used for certain procedures. It is often recommended for surgery or procedures that: Last a long time. Require you to be still or in an unusual position. Are major and can cause blood loss. Affect your breathing. The medicines used for general anesthesia are called general anesthetics. During general anesthesia, these medicines are given along with medicines that: Prevent pain. Control your blood pressure. Relax your muscles. Prevent nausea and vomiting after the procedure. Tell a health care provider about: Any allergies you have. All medicines you are taking, including vitamins, herbs, eye drops, creams, and over-the-counter medicines. Your history of any: Medical conditions you have, including: High blood pressure. Bleeding problems. Diabetes. Heart or lung conditions, such as: Heart failure. Sleep apnea. Asthma. Chronic obstructive pulmonary disease (COPD). Current or recent illnesses, such as: Upper respiratory, chest, or ear infections. Cough or fever. Tobacco or drug use, including marijuana or alcohol use. Depression or anxiety. Surgeries and types of anesthetics you have had. Problems you or family members have had with anesthetic medicines. Whether you are pregnant or may be pregnant. Whether you have any chipped or loose teeth, dentures, caps, bridgework, or issues with your mouth, swallowing, or choking. What are the risks? Your health care provider  will talk with you about risks. These may include: Allergic reaction to the medicines. Lung and heart problems. Inhaling food or liquid from the stomach into the lungs (aspiration). Nerve injury. Injury to the lips, mouth, teeth, or gums. Stroke. Waking up during your procedure and being unable to move. This is rare. These problems are more likely to develop if you are having a major surgery or if you have an advanced  or serious medical condition. You can prevent some of these complications by answering all of your health care provider's questions thoroughly and by following all instructions before your procedure. General anesthesia can cause side effects, including: Nausea or vomiting. A sore throat or hoarseness from the breathing tube. Wheezing or coughing. Shaking chills or feeling cold. Body aches. Sleepiness. Confusion, agitation (delirium), or anxiety. What happens before the procedure? When to stop eating and drinking Follow instructions from your health care provider about what you may eat and drink before your procedure. If you do not follow your health care provider's instructions, your procedure may be delayed or canceled. Medicines Ask your health care provider about: Changing or stopping your regular medicines. These include any diabetes medicines or blood thinners you take. Taking medicines such as aspirin  and ibuprofen. These medicines can thin your blood. Do not take them unless your health care provider tells you to. Taking over-the-counter medicines, vitamins, herbs, and supplements. General instructions Do not use any products that contain nicotine or tobacco for at least 4 weeks before the procedure. These products include cigarettes, chewing tobacco, and vaping devices, such as e-cigarettes. If you need help quitting, ask your health care provider. If you brush your teeth on the morning of the procedure, make sure to spit out all of the water  and toothpaste. If told by your health care provider, bring your sleep apnea device with you to surgery (if applicable). If you will be going home right after the procedure, plan to have a responsible adult: Take you home from the hospital or clinic. You will not be allowed to drive. Care for you for the time you are told. What happens during the procedure?  An IV will be inserted into one of your veins. You will be given one or more of the  following through a face mask or IV: A sedative. This helps you relax. Anesthesia. This will: Numb certain areas of your body. Make you fall asleep for surgery. After you are unconscious, a breathing tube may be inserted down your throat to help you breathe. This will be removed before you wake up. An anesthesia provider, such as an anesthesiologist, will stay with you throughout your procedure. The anesthesia provider will: Keep you comfortable and safe by continuing to give you medicines and adjusting the amount of medicine that you get. Monitor your blood pressure, heart rate, and oxygen  levels to make sure that the anesthetics do not cause any problems. The procedure may vary among health care providers and hospitals. What happens after the procedure? Your blood pressure, temperature, heart rate, breathing rate, and blood oxygen  level will be monitored until you leave the hospital or clinic. You will wake up in a recovery area. You may wake up slowly. You may be given medicine to help you with pain, nausea, or any other side effects from the anesthesia. Summary General anesthesia is the use of medicine to make you fall asleep (unconscious) for a medical procedure. Follow your health care provider's instructions about when to stop eating, drinking, or taking certain medicines  before your procedure. Plan to have a responsible adult take you home from the hospital or clinic. This information is not intended to replace advice given to you by your health care provider. Make sure you discuss any questions you have with your health care provider. Document Revised: 08/15/2021 Document Reviewed: 08/15/2021 Elsevier Patient Education  2024 [employer] Inc[employer]  How to Use Chlorhexidine  at Home in the Shower Chlorhexidine  gluconate (CHG) is a germ-killing (antiseptic) wash that's used to clean the skin. It can get rid of the germs that normally live on the skin and can keep them away for about 24  hours. If you're having surgery, you may be told to shower with CHG at home the night before surgery. This can help lower your risk for infection. To use CHG wash in the shower, follow the steps below. Supplies needed: CHG body wash. Clean washcloth. Clean towel. How to use CHG in the shower Follow these steps unless you're told to use CHG in a different way: Start the shower. Use your normal soap and shampoo to wash your face and hair. Turn off the shower or move out of the shower stream. Pour CHG onto a clean washcloth. Do not use any type of brush or rough sponge. Start at your neck, washing your body down to your toes. Make sure you: Wash the part of your body where the surgery will be done for at least 1 minute. Do not scrub. Do not use CHG on your head or face unless your health care provider tells you to. If it gets into your ears or eyes, rinse them well with water . Do not wash your genitals with CHG. Wash your back and under your arms. Make sure to wash skin folds. Let the CHG sit on your skin for 1-2 minutes or as long as told. Rinse your entire body in the shower, including all body creases and folds. Turn off the shower. Dry off with a clean towel. Do not put anything on your skin afterward, such as powder, lotion, or perfume. Put on clean clothes or pajamas. If it's the night before surgery, sleep in clean sheets. General tips Use CHG only as told, and follow the instructions on the label. Use the full amount of CHG as told. This is often one bottle. Do not smoke and stay away from flames after using CHG. Your skin may feel sticky after using CHG. This is normal. The sticky feeling will go away as the CHG dries. Do not use CHG: If you have a chlorhexidine  allergy or have reacted to chlorhexidine  in the past. On open wounds or areas of skin that have broken skin, cuts, or scrapes. On babies younger than 33 months of age. Contact a health care provider if: You have  questions about using CHG. Your skin gets irritated or itchy. You have a rash after using CHG. You swallow any CHG. Call your local poison control center [phone] in the [place] Your eyes itch badly, or they become very red or swollen. Your hearing changes. You have trouble seeing. If you can't reach your provider, go to an urgent care or emergency room. Do not drive yourself. Get help right away if: You have swelling or tingling in your mouth or throat. You make high-pitched whistling sounds when you breathe, most often when you breathe out (wheeze). You have trouble breathing. These symptoms may be an emergency. Call 911 right away. Do not wait to see if the symptoms will go away. Do not drive yourself  to the hospital. This information is not intended to replace advice given to you by your health care provider. Make sure you discuss any questions you have with your health care provider. Document Revised: 12/02/2022 Document Reviewed: 11/28/2021 Elsevier Patient Education  2024 [employer].

## 2023-10-19 ENCOUNTER — Encounter (HOSPITAL_COMMUNITY): Payer: Self-pay

## 2023-10-19 ENCOUNTER — Encounter (HOSPITAL_COMMUNITY)
Admission: RE | Admit: 2023-10-19 | Discharge: 2023-10-19 | Disposition: A | Discharge status: Home / Self Care | Source: Ambulatory Visit | Attending: Surgery | Admitting: Surgery

## 2023-10-19 VITALS — BP 130/81 | HR 82 | Temp 97.6°F | Resp 18 | Ht 62.5 in | Wt 129.0 lb

## 2023-10-19 DIAGNOSIS — I10 Essential (primary) hypertension: Secondary | ICD-10-CM | POA: Diagnosis not present

## 2023-10-19 DIAGNOSIS — Z01818 Encounter for other preprocedural examination: Secondary | ICD-10-CM | POA: Diagnosis not present

## 2023-10-19 DIAGNOSIS — D500 Iron deficiency anemia secondary to blood loss (chronic): Secondary | ICD-10-CM | POA: Diagnosis not present

## 2023-10-19 DIAGNOSIS — E119 Type 2 diabetes mellitus without complications: Secondary | ICD-10-CM | POA: Diagnosis not present

## 2023-10-19 LAB — CBC WITH DIFFERENTIAL/PLATELET
Abs Immature Granulocytes: 0.02 10*3/uL (ref 0.00–0.07)
Basophils Absolute: 0 10*3/uL (ref 0.0–0.1)
Basophils Relative: 0 %
Eosinophils Absolute: 0.2 10*3/uL (ref 0.0–0.5)
Eosinophils Relative: 3 %
HCT: 34.2 % — ABNORMAL LOW (ref 36.0–46.0)
Hemoglobin: 10.7 g/dL — ABNORMAL LOW (ref 12.0–15.0)
Immature Granulocytes: 0 %
Lymphocytes Relative: 14 %
Lymphs Abs: 1 10*3/uL (ref 0.7–4.0)
MCH: 25.5 pg — ABNORMAL LOW (ref 26.0–34.0)
MCHC: 31.3 g/dL (ref 30.0–36.0)
MCV: 81.4 fL (ref 80.0–100.0)
Monocytes Absolute: 0.7 10*3/uL (ref 0.1–1.0)
Monocytes Relative: 10 %
Neutro Abs: 5.6 10*3/uL (ref 1.7–7.7)
Neutrophils Relative %: 73 %
Platelets: 297 10*3/uL (ref 150–400)
RBC: 4.2 MIL/uL (ref 3.87–5.11)
RDW: 16.8 % — ABNORMAL HIGH (ref 11.5–15.5)
WBC: 7.6 10*3/uL (ref 4.0–10.5)
nRBC: 0 % (ref 0.0–0.2)

## 2023-10-19 LAB — BASIC METABOLIC PANEL WITH GFR
Anion gap: 6 (ref 5–15)
BUN: 15 mg/dL (ref 8–23)
CO2: 27 mmol/L (ref 22–32)
Calcium: 9.1 mg/dL (ref 8.9–10.3)
Chloride: 102 mmol/L (ref 98–111)
Creatinine, Ser: 0.93 mg/dL (ref 0.44–1.00)
GFR, Estimated: >60 mL/min (ref >60)
Glucose, Bld: 88 mg/dL (ref 70–99)
Potassium: 3.8 mmol/L (ref 3.5–5.1)
Sodium: 135 mmol/L (ref 135–145)

## 2023-10-19 LAB — HEMOGLOBIN A1C
Hgb A1c MFr Bld: 5.4 % (ref 4.8–5.6)
Mean Plasma Glucose: 108.28 mg/dL

## 2023-10-21 ENCOUNTER — Ambulatory Visit (HOSPITAL_BASED_OUTPATIENT_CLINIC_OR_DEPARTMENT_OTHER)

## 2023-10-21 ENCOUNTER — Ambulatory Visit (HOSPITAL_COMMUNITY)
Admission: RE | Admit: 2023-10-21 | Discharge: 2023-10-21 | Disposition: A | Discharge status: Home / Self Care | Attending: Surgery | Admitting: Surgery

## 2023-10-21 ENCOUNTER — Encounter (HOSPITAL_COMMUNITY)
Admission: RE | Disposition: A | Discharge status: Home / Self Care | Payer: Self-pay | Source: Home / Self Care | Attending: Surgery

## 2023-10-21 ENCOUNTER — Encounter (HOSPITAL_COMMUNITY): Payer: Self-pay | Admitting: Surgery

## 2023-10-21 ENCOUNTER — Ambulatory Visit (HOSPITAL_COMMUNITY)

## 2023-10-21 ENCOUNTER — Other Ambulatory Visit: Payer: Self-pay

## 2023-10-21 DIAGNOSIS — K219 Gastro-esophageal reflux disease without esophagitis: Secondary | ICD-10-CM | POA: Diagnosis not present

## 2023-10-21 DIAGNOSIS — R590 Localized enlarged lymph nodes: Secondary | ICD-10-CM

## 2023-10-21 DIAGNOSIS — J440 Chronic obstructive pulmonary disease with acute lower respiratory infection: Secondary | ICD-10-CM | POA: Insufficient documentation

## 2023-10-21 DIAGNOSIS — Z87891 Personal history of nicotine dependence: Secondary | ICD-10-CM | POA: Insufficient documentation

## 2023-10-21 DIAGNOSIS — G4730 Sleep apnea, unspecified: Secondary | ICD-10-CM | POA: Insufficient documentation

## 2023-10-21 DIAGNOSIS — Z7984 Long term (current) use of oral hypoglycemic drugs: Secondary | ICD-10-CM | POA: Insufficient documentation

## 2023-10-21 DIAGNOSIS — Z9011 Acquired absence of right breast and nipple: Secondary | ICD-10-CM | POA: Insufficient documentation

## 2023-10-21 DIAGNOSIS — I2510 Atherosclerotic heart disease of native coronary artery without angina pectoris: Secondary | ICD-10-CM | POA: Diagnosis not present

## 2023-10-21 DIAGNOSIS — C801 Malignant (primary) neoplasm, unspecified: Secondary | ICD-10-CM | POA: Diagnosis not present

## 2023-10-21 DIAGNOSIS — D500 Iron deficiency anemia secondary to blood loss (chronic): Secondary | ICD-10-CM

## 2023-10-21 DIAGNOSIS — E119 Type 2 diabetes mellitus without complications: Secondary | ICD-10-CM | POA: Diagnosis not present

## 2023-10-21 DIAGNOSIS — I10 Essential (primary) hypertension: Secondary | ICD-10-CM | POA: Insufficient documentation

## 2023-10-21 DIAGNOSIS — C774 Secondary and unspecified malignant neoplasm of inguinal and lower limb lymph nodes: Secondary | ICD-10-CM | POA: Diagnosis not present

## 2023-10-21 DIAGNOSIS — R591 Generalized enlarged lymph nodes: Secondary | ICD-10-CM | POA: Diagnosis present

## 2023-10-21 HISTORY — PX: INGUINAL LYMPH NODE BIOPSY: SHX5865

## 2023-10-21 LAB — GLUCOSE, CAPILLARY: Glucose-Capillary: 89 mg/dL (ref 70–99)

## 2023-10-21 SURGERY — BIOPSY, LYMPH NODE, INGUINAL, OPEN
Anesthesia: General | Site: Inguinal | Laterality: Left

## 2023-10-21 MED ORDER — ROCURONIUM BROMIDE 10 MG/ML (PF) SYRINGE
PREFILLED_SYRINGE | INTRAVENOUS | Status: DC | PRN
  Administered 2023-10-21: 50 mg via INTRAVENOUS

## 2023-10-21 MED ORDER — PROPOFOL 10 MG/ML IV BOLUS
INTRAVENOUS | Status: AC
  Filled 2023-10-21: qty 20

## 2023-10-21 MED ORDER — CHLORHEXIDINE GLUCONATE 0.12 % MT SOLN
15.0000 mL | Freq: Once | OROMUCOSAL | Status: DC
Start: 2023-10-21 — End: 2023-10-21

## 2023-10-21 MED ORDER — ROCURONIUM BROMIDE 10 MG/ML (PF) SYRINGE
PREFILLED_SYRINGE | INTRAVENOUS | Status: AC
  Filled 2023-10-21: qty 10

## 2023-10-21 MED ORDER — CHLORHEXIDINE GLUCONATE CLOTH 2 % EX PADS: 6.0000 | MEDICATED_PAD | Freq: Once | CUTANEOUS | Status: DC

## 2023-10-21 MED ORDER — ONDANSETRON HCL 4 MG/2ML IJ SOLN
INTRAMUSCULAR | Status: DC | PRN
  Administered 2023-10-21: 4 mg via INTRAVENOUS

## 2023-10-21 MED ORDER — LACTATED RINGERS IV SOLN: INTRAVENOUS | Status: DC

## 2023-10-21 MED ORDER — PHENYLEPHRINE 80 MCG/ML (10ML) SYRINGE FOR IV PUSH (FOR BLOOD PRESSURE SUPPORT)
PREFILLED_SYRINGE | INTRAVENOUS | Status: DC | PRN
  Administered 2023-10-21: 80 ug via INTRAVENOUS
  Administered 2023-10-21 (×2): 160 ug via INTRAVENOUS
  Administered 2023-10-21 (×2): 80 ug via INTRAVENOUS
  Administered 2023-10-21: 160 ug via INTRAVENOUS

## 2023-10-21 MED ORDER — CHLORHEXIDINE GLUCONATE 0.12 % MT SOLN
15.0000 mL | Freq: Once | OROMUCOSAL | Status: AC
  Administered 2023-10-21: 15 mL via OROMUCOSAL

## 2023-10-21 MED ORDER — BUPIVACAINE HCL (PF) 0.5 % IJ SOLN
INTRAMUSCULAR | Status: DC | PRN
Start: 2023-10-21 — End: 2023-10-21
  Administered 2023-10-21: 30 mL

## 2023-10-21 MED ORDER — LIDOCAINE 2% (20 MG/ML) 5 ML SYRINGE
INTRAMUSCULAR | Status: DC | PRN
Start: 2023-10-21 — End: 2023-10-21
  Administered 2023-10-21: 60 mg via INTRAVENOUS

## 2023-10-21 MED ORDER — EPHEDRINE SULFATE-NACL 50-0.9 MG/10ML-% IV SOSY
PREFILLED_SYRINGE | INTRAVENOUS | Status: DC | PRN
Start: 2023-10-21 — End: 2023-10-21
  Administered 2023-10-21: 15 mg via INTRAVENOUS
  Administered 2023-10-21: 10 mg via INTRAVENOUS

## 2023-10-21 MED ORDER — OXYCODONE HCL 5 MG PO TABS: 5.0000 mg | ORAL_TABLET | Freq: Four times a day (QID) | ORAL | 0 refills | Status: DC | PRN

## 2023-10-21 MED ORDER — PROPOFOL 10 MG/ML IV BOLUS
INTRAVENOUS | Status: DC | PRN
Start: 2023-10-21 — End: 2023-10-21
  Administered 2023-10-21: 100 mg via INTRAVENOUS
  Administered 2023-10-21: 20 mg via INTRAVENOUS

## 2023-10-21 MED ORDER — CEFAZOLIN SODIUM-DEXTROSE 2-4 GM/100ML-% IV SOLN
2.0000 g | INTRAVENOUS | Status: AC
  Administered 2023-10-21: 2 mg via INTRAVENOUS
  Filled 2023-10-21: qty 100

## 2023-10-21 MED ORDER — ONDANSETRON HCL 4 MG/2ML IJ SOLN
INTRAMUSCULAR | Status: AC
  Filled 2023-10-21: qty 2

## 2023-10-21 MED ORDER — FENTANYL CITRATE PF 50 MCG/ML IJ SOSY: 25.0000 ug | PREFILLED_SYRINGE | INTRAMUSCULAR | Status: DC | PRN

## 2023-10-21 MED ORDER — ORAL CARE MOUTH RINSE: 15.0000 mL | Freq: Once | OROMUCOSAL | Status: DC

## 2023-10-21 MED ORDER — BUPIVACAINE HCL (PF) 0.5 % IJ SOLN
INTRAMUSCULAR | Status: AC
  Filled 2023-10-21: qty 30

## 2023-10-21 MED ORDER — SUGAMMADEX SODIUM 200 MG/2ML IV SOLN
INTRAVENOUS | Status: DC | PRN
  Administered 2023-10-21: 200 mg via INTRAVENOUS

## 2023-10-21 MED ORDER — ASPIRIN EC 81 MG PO TBEC: 81.0000 mg | DELAYED_RELEASE_TABLET | ORAL | Status: DC | PRN

## 2023-10-21 MED ORDER — HYDROCODONE-ACETAMINOPHEN 7.5-325 MG PO TABS: 1.0000 | ORAL_TABLET | Freq: Once | ORAL | Status: DC | PRN

## 2023-10-21 MED ORDER — ACETAMINOPHEN 500 MG PO TABS: 1000.0000 mg | ORAL_TABLET | Freq: Four times a day (QID) | ORAL | 0 refills | Status: AC

## 2023-10-21 MED ORDER — FENTANYL CITRATE (PF) 100 MCG/2ML IJ SOLN
INTRAMUSCULAR | Status: DC | PRN
  Administered 2023-10-21: 50 ug via INTRAVENOUS

## 2023-10-21 MED ORDER — PHENYLEPHRINE 80 MCG/ML (10ML) SYRINGE FOR IV PUSH (FOR BLOOD PRESSURE SUPPORT)
PREFILLED_SYRINGE | INTRAVENOUS | Status: AC
  Filled 2023-10-21: qty 10

## 2023-10-21 MED ORDER — DOCUSATE SODIUM 100 MG PO CAPS
100.0000 mg | ORAL_CAPSULE | Freq: Two times a day (BID) | ORAL | 2 refills | Status: AC
Start: 2023-10-21 — End: 2024-10-20

## 2023-10-21 MED ORDER — LIDOCAINE 2% (20 MG/ML) 5 ML SYRINGE
INTRAMUSCULAR | Status: AC
  Filled 2023-10-21: qty 5

## 2023-10-21 MED ORDER — 0.9 % SODIUM CHLORIDE (POUR BTL) OPTIME
TOPICAL | Status: DC | PRN
  Administered 2023-10-21: 1000 mL

## 2023-10-21 MED ORDER — FENTANYL CITRATE (PF) 100 MCG/2ML IJ SOLN
INTRAMUSCULAR | Status: AC
  Filled 2023-10-21: qty 2

## 2023-10-21 SURGICAL SUPPLY — 23 items
CNTNR URN SCR LID CUP LEK RST (MISCELLANEOUS) ×1 IMPLANT
COVER LIGHT HANDLE STERIS (MISCELLANEOUS) ×2 IMPLANT
DERMABOND ADVANCED .7 DNX12 (GAUZE/BANDAGES/DRESSINGS) ×1 IMPLANT
DISSECTOR SURG LIGASURE 21 (MISCELLANEOUS) IMPLANT
ELECTRODE REM PT RTRN 9FT ADLT (ELECTROSURGICAL) ×1 IMPLANT
GLOVE BIOGEL PI IND STRL 6.5 (GLOVE) ×1 IMPLANT
GLOVE BIOGEL PI IND STRL 7.0 (GLOVE) ×2 IMPLANT
GLOVE SURG SS PI 6.5 STRL IVOR (GLOVE) ×1 IMPLANT
GOWN STRL REUS W/TWL LRG LVL3 (GOWN DISPOSABLE) ×2 IMPLANT
KIT TURNOVER KIT A (KITS) ×1 IMPLANT
MANIFOLD NEPTUNE II (INSTRUMENTS) ×1 IMPLANT
MARKER SKIN DUAL TIP RULER LAB (MISCELLANEOUS) IMPLANT
NDL HYPO 25X1 1.5 SAFETY (NEEDLE) ×1 IMPLANT
NEEDLE HYPO 25X1 1.5 SAFETY (NEEDLE) ×1 IMPLANT
NS IRRIG 1000ML POUR BTL (IV SOLUTION) ×1 IMPLANT
PACK MINOR (CUSTOM PROCEDURE TRAY) ×1 IMPLANT
PAD ARMBOARD POSITIONER FOAM (MISCELLANEOUS) ×1 IMPLANT
POSITIONER HEAD 8X9X4 ADT (SOFTGOODS) ×1 IMPLANT
SET BASIN LINEN APH (SET/KITS/TRAYS/PACK) ×1 IMPLANT
SPONGE INTESTINAL PEANUT (DISPOSABLE) IMPLANT
SUT MNCRL AB 4-0 PS2 18 (SUTURE) ×1 IMPLANT
SUT VIC AB 3-0 SH 27X BRD (SUTURE) ×1 IMPLANT
SYR CONTROL 10ML LL (SYRINGE) ×1 IMPLANT

## 2023-10-21 NOTE — Anesthesia Postprocedure Evaluation (Signed)
 Anesthesia Post Note  Patient: Nancy Liu  Procedure(s) Performed: BIOPSY, LYMPH NODE, INGUINAL, OPEN (Left: Inguinal)  Patient location during evaluation: PACU Anesthesia Type: General Level of consciousness: awake and alert Pain management: pain level controlled Vital Signs Assessment: post-procedure vital signs reviewed and stable Respiratory status: spontaneous breathing, nonlabored ventilation, respiratory function stable and patient connected to nasal cannula oxygen  Cardiovascular status: blood pressure returned to baseline and stable Postop Assessment: no apparent nausea or vomiting Anesthetic complications: no  No notable events documented.   Last Vitals:  Vitals:   10/21/23 0845 10/21/23 0856  BP: 133/71   Pulse:  73  Resp: 18 19  Temp:    SpO2: 100% 100%    Last Pain:  Vitals:   10/21/23 0856  TempSrc:   PainSc: 0-No pain                 Beacher Limerick

## 2023-10-21 NOTE — Progress Notes (Signed)
 [facility]  Spoke with the patient's family in the consultation room.  I explained that she tolerated the procedure without difficulty.  She has dissolvable stitches under the skin with overlying skin glue.  This will flake off in 10 to 14 days.  I discharged her home with a small prescription for narcotic pain medication that they should take as needed for pain.  I also want her taking scheduled Tylenol .  If they take the narcotic pain medication, they should take a stool softener as well.  The patient will follow-up with me in 2 weeks.  All questions were answered to their expressed satisfaction.  Lidia Reels, DO [facility] [address] [place], [place] [address] [phone] (office)

## 2023-10-21 NOTE — Interval H&P Note (Signed)
 History and Physical Interval Note:  10/21/2023 7:26 AM  Nancy Liu  has presented today for surgery, with the diagnosis of LYMPHADENOPATHY.  The various methods of treatment have been discussed with the patient and family. After consideration of risks, benefits and other options for treatment, the patient has consented to  Procedure(s): BIOPSY, LYMPH NODE, INGUINAL, OPEN (Left) as a surgical intervention.  The patient's history has been reviewed, patient examined, no change in status, stable for surgery.  I have reviewed the patient's chart and labs.  Questions were answered to the patient's satisfaction.     Delvis Kau A Angelic Schnelle

## 2023-10-21 NOTE — Discharge Instructions (Signed)
 Ambulatory Surgery Discharge Instructions  General Anesthesia or Sedation Do not drive or operate heavy machinery for 24 hours.  Do not consume alcohol, tranquilizers, sleeping medications, or any non-prescribed medications for 24 hours. Do not make important decisions or sign any important papers in the next 24 hours. You should have someone with you tonight at home.  Activity  You are advised to go directly home from the hospital.  Restrict your activities and rest for a day.  Resume light activity tomorrow. No heavy lifting over 10 lbs or strenuous exercise.  Fluids and Diet Begin with clear liquids, bouillon, dry toast, soda crackers.  If not nauseated, you may go to a regular diet when you desire.  Greasy and spicy foods are not advised.  Medications  If you have not had a bowel movement in 24 hours, take 2 tablespoons over the counter Milk of mag.             You May resume your blood thinners tomorrow (Aspirin, coumadin, or other).  You are being discharged with prescriptions for Opioid/Narcotic Medications: There are some specific considerations for these medications that you should know. Opioid Meds have risks & benefits. Addiction to these meds is always a concern with prolonged use Take medication only as directed Do not drive while taking narcotic pain medication Do not crush tablets or capsules Do not use a different container than medication was dispensed in Lock the container of medication in a cool, dry place out of reach of children and pets. Opioid medication can cause addiction Do not share with anyone else (this is a felony) Do not store medications for future use. Dispose of them properly.     Disposal:  Find a [employer] household drug take back site near you.  If you can't get to a drug take back site, use the recipe below as a last resort to dispose of expired, unused or unwanted drugs. Disposal  (Do not dispose chemotherapy drugs this way, talk to your  prescribing doctor instead.) Step 1: Mix drugs (do not crush) with dirt, kitty litter, or used coffee grounds and add a small amount of water to dissolve any solid medications. Step 2: Seal drugs in plastic bag. Step 3: Place plastic bag in trash. Step 4: Take prescription container and scratch out personal information, then recycle or throw away.  Operative Site  You have a liquid bandage over your incisions, this will begin to flake off in about a week. Ok to English as a second language teacher. Keep wound clean and dry. No baths or swimming. No lifting more than 10 pounds.  Contact Information: If you have questions or concerns, please call our office, [phone], Monday- Thursday 8AM-5PM and Friday 8AM-12Noon.  If it is after hours or on the weekend, please call [facility] Main Number, [phone], and ask to speak to the surgeon on call for Dr. Robyne Peers at [facility].   SPECIFIC COMPLICATIONS TO WATCH FOR: Inability to urinate Fever over 101? F by mouth Nausea and vomiting lasting longer than 24 hours. Pain not relieved by medication ordered Swelling around the operative site Increased redness, warmth, hardness, around operative area Numbness, tingling, or cold fingers or toes Blood -soaked dressing, (small amounts of oozing may be normal) Increasing and progressive drainage from surgical area or exam site

## 2023-10-21 NOTE — Anesthesia Procedure Notes (Addendum)
 Procedure Name: Intubation Date/Time: 10/21/2023 7:46 AM  Performed by: Juluis Ok, CRNAPre-anesthesia Checklist: Patient identified, Emergency Drugs available, Suction available and Patient being monitored Patient Re-evaluated:Patient Re-evaluated prior to induction Oxygen  Delivery Method: Circle system utilized Preoxygenation: Pre-oxygenation with 100% oxygen  Induction Type: IV induction Ventilation: Mask ventilation without difficulty Laryngoscope Size: Glidescope and 3 Grade View: Grade I Tube type: Oral Tube size: 6.5 mm Number of attempts: 1 Airway Equipment and Method: Stylet and Video-laryngoscopy Placement Confirmation: ETT inserted through vocal cords under direct vision, positive ETCO2, CO2 detector and breath sounds checked- equal and bilateral Secured at: 22 (OETT positioned 22 cm upper lip.) cm Tube secured with: Tape Dental Injury: Teeth and Oropharynx as per pre-operative assessment  Comments: DL x 1 Grade III view. Unable to lift "stiff' epiglottis. Atraumatic intubation x 1 using Glidescope 3 blade. Lips and teeth remain in preoperative condition.

## 2023-10-21 NOTE — Op Note (Signed)
 [facility] Operative Note  10/21/23  Preoperative Diagnosis: Left inguinal lymphadenopathy   Postoperative Diagnosis: Same   Procedure(s) Performed: Excision of left inguinal lymph node   Surgeon: Lidia Reels, DO    Assistants: No qualified resident was available    Anesthesia: General endotracheal   Anesthesiologist: Beacher Limerick, MD    Specimens: Left inguinal lymph node   Estimated Blood Loss: Minimal   Blood Replacement: None    Complications: None   Wound Class: Clean   Operative Indications: Patient is an 83 year old female who presents for left inguinal lymph node biopsy.  She has had recent weight loss and a PET scan demonstrated a hypermetabolic left inguinal lymph node.  She is agreeable to lymph node biopsy at this time.  All risks and benefits of performing this procedure were discussed with the patient including pain, infection, bleeding, damage to the surrounding structures, need for more procedures or surgery, and seroma/lymphocele. The patient voiced understanding of the procedure, all questions were sought and answered, and consent was obtained.  Findings: Enlarged left inguinal lymph node   Procedure: The patient was taken to the operating room and placed supine. General endotracheal anesthesia was induced. Intravenous antibiotics were administered per protocol. The left groin was prepared and draped in the usual sterile fashion.  A time-out was completed verifying correct patient, procedure, site, positioning, and implant(s) and/or special equipment prior to beginning this procedure.  A linear incision was made overlying the enlarged lymph node. Using electrocautery, the dermis and subcutaneous tissues were dissected down to the lymph node.  Using a handheld LigaSure, the lymph node was dissected off of surrounding structures. The lymph node was removed in its entirety and sent fresh to pathology for evaluation.  The incision was  localized with marcaine .  Hemostasis was achieved using electrocautery.  A deep fascial layer was closed with 3-0 Vicryl in a running fashion.  The dermis was approximated using interrupted 3-0 Vicryl sutures. The skin was closed using a running 4-0 Monocryl subcuticular stitch. Dermabond was applied.  Final inspection revealed acceptable hemostasis. All counts were correct at the end of the case. The patient was awakened from anesthesia and extubated without complication.  The patient went to the PACU in stable condition.   Lidia Reels, DO  [facility] [address] [place], [place] [address] [phone] (office)

## 2023-10-21 NOTE — Anesthesia Preprocedure Evaluation (Addendum)
 Anesthesia Evaluation  Patient identified by MRN, date of birth, ID band Patient awake    Reviewed: Allergy & Precautions, H&P , NPO status , Patient's Chart, lab work & pertinent test results  Airway Mallampati: II  TM Distance: >3 FB Neck ROM: Full    Dental no notable dental hx.    Pulmonary sleep apnea , COPD, former smoker   Pulmonary exam normal breath sounds clear to auscultation       Cardiovascular hypertension, + CAD and + DOE  Normal cardiovascular exam Rhythm:Regular Rate:Normal  Ef 60-65% 2023   Neuro/Psych negative neurological ROS     GI/Hepatic Neg liver ROS,GERD  ,,  Endo/Other  diabetes, Oral Hypoglycemic Agents    Renal/GU Renal diseasenegative Renal ROS  negative genitourinary   Musculoskeletal negative musculoskeletal ROS (+)    Abdominal   Peds negative pediatric ROS (+)  Hematology  (+) Blood dyscrasia, anemia   Anesthesia Other Findings   Reproductive/Obstetrics negative OB ROS                             Anesthesia Physical Anesthesia Plan  ASA: 3  Anesthesia Plan: General   Post-op Pain Management:    Induction: Intravenous  PONV Risk Score and Plan:   Airway Management Planned: Oral ETT  Additional Equipment:   Intra-op Plan:   Post-operative Plan: Extubation in OR  Informed Consent: I have reviewed the patients History and Physical, chart, labs and discussed the procedure including the risks, benefits and alternatives for the proposed anesthesia with the patient or authorized representative who has indicated his/her understanding and acceptance.     Dental advisory given  Plan Discussed with: CRNA  Anesthesia Plan Comments:        Anesthesia Quick Evaluation

## 2023-10-21 NOTE — Transfer of Care (Addendum)
 Immediate Anesthesia Transfer of Care Note  Patient: Nancy Liu  Procedure(s) Performed: BIOPSY, LYMPH NODE, INGUINAL, OPEN (Left: Inguinal)  Patient Location: PACU  Anesthesia Type:General  Level of Consciousness: awake and patient cooperative  Airway & Oxygen  Therapy: Patient Spontanous Breathing and Patient connected to nasal cannula oxygen   Post-op Assessment: Report given to RN and Post -op Vital signs reviewed and stable  Post vital signs: Reviewed and stable  Last Vitals:  Vitals Value Taken Time  BP 138/72 10/21/23   0843  Temp 36.4 10/21/23   0843  Pulse 73 10/21/23   0843  Resp 17 10/21/23 0848  SpO2 100% 10/21/23   0843  Vitals shown include unfiled device data.  Last Pain:  Vitals:   10/21/23 0642  TempSrc: Oral  PainSc: 0-No pain         Complications: No notable events documented.

## 2023-10-21 NOTE — Addendum Note (Signed)
 Addendum  created 10/21/23 1320 by Juluis Ok, CRNA   Clinical Note Signed

## 2023-10-22 ENCOUNTER — Encounter (HOSPITAL_COMMUNITY): Payer: Self-pay | Admitting: Surgery

## 2023-10-23 ENCOUNTER — Other Ambulatory Visit: Payer: Self-pay | Admitting: Neurology

## 2023-10-27 ENCOUNTER — Ambulatory Visit: Admitting: Oncology

## 2023-10-28 ENCOUNTER — Telehealth (INDEPENDENT_AMBULATORY_CARE_PROVIDER_SITE_OTHER): Payer: Self-pay | Admitting: Surgery

## 2023-10-28 ENCOUNTER — Inpatient Hospital Stay: Admitting: Oncology

## 2023-10-28 DIAGNOSIS — C774 Secondary and unspecified malignant neoplasm of inguinal and lower limb lymph nodes: Secondary | ICD-10-CM

## 2023-10-28 NOTE — Telephone Encounter (Signed)
 [facility]  Called to update the patient regarding her pathology results.  No answer. Patient scheduled to see Dr. Orvis Blare next week to further discuss results of pathology.  Pathology: A. LYMPH NODE, INGUINAL, BIOPSY:  - Metastatic poorly differentiated adenocarcinoma, see comment   COMMENT:   Immunohistochemical stains show that the tumor cells are positive for  CK7 and PAX8 with patchy, weak labeling for CK5/6 and p16.  Immunostains  for CK20, CDX2, GATA3 and p63 are negative.  This immunoprofile is most  consistent with a gynecologic primary while differential diagnosis can  include a primary renal cell carcinoma but is less likely.  Immunohistochemical stain for p53 is pending and will be reported in an  addendum.  Dr. Brunetta Capes reviewed the case and concurs with the diagnosis.  Dr. Cherilyn Corn was notified on 10/28/2023.   Lidia Reels, DO [facility] [address] [place], [place] [address] [phone] (office)

## 2023-10-29 ENCOUNTER — Telehealth (INDEPENDENT_AMBULATORY_CARE_PROVIDER_SITE_OTHER): Payer: Self-pay | Admitting: Surgery

## 2023-10-29 DIAGNOSIS — C774 Secondary and unspecified malignant neoplasm of inguinal and lower limb lymph nodes: Secondary | ICD-10-CM

## 2023-10-29 LAB — SURGICAL PATHOLOGY

## 2023-10-29 NOTE — Telephone Encounter (Signed)
 [facility]  Called the patient's daughter, Burnice Carton, to give her an update on the pathology.  I explained that the lymph node biopsy demonstrated metastatic adenocarcinoma, but we are not sure of the primary source of the cancer.  She is scheduled to see Dr. Orvis Blare next week to discuss next steps for treatment.  She will also see me next week for a wound recheck.  All questions were answered to her expressed satisfaction.  Pathology: A. LYMPH NODE, INGUINAL, BIOPSY:  - Metastatic poorly differentiated adenocarcinoma, see comment   COMMENT:   Immunohistochemical stains show that the tumor cells are positive for  CK7 and PAX8 with patchy, weak labeling for CK5/6 and p16.  Immunostains  for CK20, CDX2, GATA3 and p63 are negative.  This immunoprofile is most  consistent with a gynecologic primary while differential diagnosis can  include a primary renal cell carcinoma but is less likely.  Immunohistochemical stain for p53 is pending and will be reported in an  addendum.  Dr. Brunetta Capes reviewed the case and concurs with the diagnosis.  Dr. Cherilyn Corn was notified on 10/28/2023.   Lidia Reels, DO [facility] [address] [place], [place] [address] [phone] (office)

## 2023-11-02 ENCOUNTER — Inpatient Hospital Stay: Attending: Oncology | Admitting: Dietician

## 2023-11-02 DIAGNOSIS — D509 Iron deficiency anemia, unspecified: Secondary | ICD-10-CM | POA: Insufficient documentation

## 2023-11-02 DIAGNOSIS — D0511 Intraductal carcinoma in situ of right breast: Secondary | ICD-10-CM | POA: Insufficient documentation

## 2023-11-02 DIAGNOSIS — G4730 Sleep apnea, unspecified: Secondary | ICD-10-CM | POA: Insufficient documentation

## 2023-11-02 DIAGNOSIS — Z955 Presence of coronary angioplasty implant and graft: Secondary | ICD-10-CM | POA: Insufficient documentation

## 2023-11-02 DIAGNOSIS — E785 Hyperlipidemia, unspecified: Secondary | ICD-10-CM | POA: Insufficient documentation

## 2023-11-02 DIAGNOSIS — E538 Deficiency of other specified B group vitamins: Secondary | ICD-10-CM | POA: Insufficient documentation

## 2023-11-02 DIAGNOSIS — Z9011 Acquired absence of right breast and nipple: Secondary | ICD-10-CM | POA: Insufficient documentation

## 2023-11-02 DIAGNOSIS — I2510 Atherosclerotic heart disease of native coronary artery without angina pectoris: Secondary | ICD-10-CM | POA: Insufficient documentation

## 2023-11-02 DIAGNOSIS — Z8041 Family history of malignant neoplasm of ovary: Secondary | ICD-10-CM | POA: Insufficient documentation

## 2023-11-02 DIAGNOSIS — R978 Other abnormal tumor markers: Secondary | ICD-10-CM | POA: Insufficient documentation

## 2023-11-02 DIAGNOSIS — R159 Full incontinence of feces: Secondary | ICD-10-CM | POA: Insufficient documentation

## 2023-11-02 DIAGNOSIS — I10 Essential (primary) hypertension: Secondary | ICD-10-CM | POA: Insufficient documentation

## 2023-11-02 DIAGNOSIS — Z803 Family history of malignant neoplasm of breast: Secondary | ICD-10-CM | POA: Insufficient documentation

## 2023-11-02 DIAGNOSIS — J449 Chronic obstructive pulmonary disease, unspecified: Secondary | ICD-10-CM | POA: Insufficient documentation

## 2023-11-02 DIAGNOSIS — E119 Type 2 diabetes mellitus without complications: Secondary | ICD-10-CM | POA: Insufficient documentation

## 2023-11-02 DIAGNOSIS — K219 Gastro-esophageal reflux disease without esophagitis: Secondary | ICD-10-CM | POA: Insufficient documentation

## 2023-11-02 DIAGNOSIS — C801 Malignant (primary) neoplasm, unspecified: Secondary | ICD-10-CM | POA: Insufficient documentation

## 2023-11-02 DIAGNOSIS — Z79899 Other long term (current) drug therapy: Secondary | ICD-10-CM | POA: Insufficient documentation

## 2023-11-02 DIAGNOSIS — D563 Thalassemia minor: Secondary | ICD-10-CM | POA: Insufficient documentation

## 2023-11-02 DIAGNOSIS — C774 Secondary and unspecified malignant neoplasm of inguinal and lower limb lymph nodes: Secondary | ICD-10-CM | POA: Insufficient documentation

## 2023-11-02 NOTE — Progress Notes (Signed)
 Nutrition Follow-up:  Pt under surveillance for breast cancer and IDA with concerning left inguinal lymph node on recent imaging s/p surgical excision 5/21 under the care of Dr. Cherilyn Corn. Patient is followed by Dr. Orvis Blare. Treatment plan currently under workup.   Met with patient and daughter in clinic. Patient reports appetite has improved. Patient has been taking mirtazipine. She reports eating 3 good meals. Breakfast is the largest and favorite meal. Recalls bacon, eggs with cheese, and applesauce this morning. Had a baked potato for lunch. Patient ate peas, corn, squash, and baked chicken for dinner. Patient reports not drinking Boost in the last week as this was giving her diarrhea after drinking. This did not happen with Ensure. Patient endorses new stool incontinence. Sometimes occurs after eating. This happened last night after dinner. Patient using A&D ointment on chapped bottom.     Medications: reviewed   Labs: 5/19 reviewed   Anthropometrics: Wt 132.44 lb today in clinic - increased   5/8 - 129 lb    NUTRITION DIAGNOSIS: Unintended wt loss - improved   INTERVENTION:  Continue strategies for increasing calories and protein - snack ideas + protein foods list Discussed strategies for diarrhea, foods best tolerated - provided samples of Banatrol  Suggested daily Ensure Complete if tolerated - samples + coupons provided Contact information     MONITORING, EVALUATION, GOAL: wt trends, intake    NEXT VISIT: To be scheduled with plan

## 2023-11-04 ENCOUNTER — Ambulatory Visit (INDEPENDENT_AMBULATORY_CARE_PROVIDER_SITE_OTHER): Admitting: Surgery

## 2023-11-04 ENCOUNTER — Encounter: Payer: Self-pay | Admitting: Surgery

## 2023-11-04 ENCOUNTER — Inpatient Hospital Stay: Admitting: Oncology

## 2023-11-04 VITALS — BP 136/81 | HR 82 | Temp 97.9°F | Resp 12 | Ht 62.5 in | Wt 134.0 lb

## 2023-11-04 DIAGNOSIS — Z09 Encounter for follow-up examination after completed treatment for conditions other than malignant neoplasm: Secondary | ICD-10-CM

## 2023-11-04 NOTE — Progress Notes (Unsigned)
 [facility] Note   HPI:  83 y.o. Female presents to clinic for post-op follow-up s/p left inguinal lymph node biopsy on 5/21.  She has been doing well.  She has no pain associated with her surgical site.  She denies any issues at the incision site.  Denies fevers and chills.  Review of Systems:  All other review of systems: otherwise negative   Vital Signs:  BP 136/81   Pulse 82   Temp 97.9 F (36.6 C) (Oral)   Resp 12   Ht 5' 2.5" (1.588 m)   Wt 134 lb (60.8 kg)   SpO2 97%   BMI 24.12 kg/m    Physical Exam:  Physical Exam Vitals reviewed.  Constitutional:      Appearance: Normal appearance.  Abdominal:     Comments: Abdomen soft, nondistended, no percussion tenderness, nontender to palpation; left groin incision site healing well with no overlying skin glue  Neurological:     Mental Status: She is alert.     Laboratory studies: None   Imaging:  None  Pathology:  A. LYMPH NODE, INGUINAL, BIOPSY:  - Metastatic poorly differentiated adenocarcinoma, see comment   COMMENT:   Immunohistochemical stains show that the tumor cells are positive for  CK7 and PAX8 with patchy, weak labeling for CK5/6 and p16.  Immunostains  for CK20, CDX2, GATA3 and p63 are negative.  This immunoprofile is most  consistent with a gynecologic primary while differential diagnosis can  include a primary renal cell carcinoma but is less likely.  Immunohistochemical stain for p53 is pending and will be reported in an  addendum.  Dr. Brunetta Capes reviewed the case and concurs with the diagnosis.  Dr. Cherilyn Corn was notified on 10/28/2023.   Assessment:  83 y.o. yo Female who presents for follow up s/p left inguinal lymph node biopsy on 5/21.  Plan:  - Patient is doing well from a postoperative standpoint - We discussed her pathology results.  I explained that cancer is present in this lymph node, however I do not know the source.  She is scheduled to follow-up with Dr. Orvis Blare and  next week, at which time they will discuss next steps in her workup - Follow up with me as needed  All of the above recommendations were discussed with the patient and patient's family, and all of patient's and family's questions were answered to their expressed satisfaction.  Note: Portions of this report may have been transcribed using voice recognition software. Every effort has been made to ensure accuracy; however, inadvertent computerized transcription errors may still be present.   Lidia Reels, DO [facility] [address] [place], [place] [address] [phone] (office)

## 2023-11-10 ENCOUNTER — Inpatient Hospital Stay: Admitting: Oncology

## 2023-11-10 ENCOUNTER — Inpatient Hospital Stay

## 2023-11-10 VITALS — BP 124/67 | HR 90 | Temp 98.1°F | Resp 18 | Ht 61.61 in | Wt 135.6 lb

## 2023-11-10 DIAGNOSIS — E538 Deficiency of other specified B group vitamins: Secondary | ICD-10-CM

## 2023-11-10 DIAGNOSIS — D563 Thalassemia minor: Secondary | ICD-10-CM | POA: Diagnosis not present

## 2023-11-10 DIAGNOSIS — C801 Malignant (primary) neoplasm, unspecified: Secondary | ICD-10-CM | POA: Diagnosis not present

## 2023-11-10 DIAGNOSIS — C579 Malignant neoplasm of female genital organ, unspecified: Secondary | ICD-10-CM

## 2023-11-10 DIAGNOSIS — Z955 Presence of coronary angioplasty implant and graft: Secondary | ICD-10-CM | POA: Diagnosis not present

## 2023-11-10 DIAGNOSIS — D509 Iron deficiency anemia, unspecified: Secondary | ICD-10-CM

## 2023-11-10 DIAGNOSIS — R159 Full incontinence of feces: Secondary | ICD-10-CM | POA: Diagnosis not present

## 2023-11-10 DIAGNOSIS — Z803 Family history of malignant neoplasm of breast: Secondary | ICD-10-CM | POA: Diagnosis not present

## 2023-11-10 DIAGNOSIS — Z8041 Family history of malignant neoplasm of ovary: Secondary | ICD-10-CM | POA: Diagnosis not present

## 2023-11-10 DIAGNOSIS — C569 Malignant neoplasm of unspecified ovary: Secondary | ICD-10-CM | POA: Diagnosis not present

## 2023-11-10 DIAGNOSIS — I2510 Atherosclerotic heart disease of native coronary artery without angina pectoris: Secondary | ICD-10-CM | POA: Diagnosis not present

## 2023-11-10 DIAGNOSIS — Z79899 Other long term (current) drug therapy: Secondary | ICD-10-CM | POA: Diagnosis not present

## 2023-11-10 DIAGNOSIS — G4730 Sleep apnea, unspecified: Secondary | ICD-10-CM | POA: Diagnosis not present

## 2023-11-10 DIAGNOSIS — D0511 Intraductal carcinoma in situ of right breast: Secondary | ICD-10-CM | POA: Diagnosis not present

## 2023-11-10 DIAGNOSIS — C774 Secondary and unspecified malignant neoplasm of inguinal and lower limb lymph nodes: Secondary | ICD-10-CM | POA: Diagnosis not present

## 2023-11-10 DIAGNOSIS — K219 Gastro-esophageal reflux disease without esophagitis: Secondary | ICD-10-CM | POA: Diagnosis not present

## 2023-11-10 DIAGNOSIS — I10 Essential (primary) hypertension: Secondary | ICD-10-CM | POA: Diagnosis not present

## 2023-11-10 DIAGNOSIS — R978 Other abnormal tumor markers: Secondary | ICD-10-CM | POA: Diagnosis not present

## 2023-11-10 DIAGNOSIS — E785 Hyperlipidemia, unspecified: Secondary | ICD-10-CM | POA: Diagnosis not present

## 2023-11-10 DIAGNOSIS — J449 Chronic obstructive pulmonary disease, unspecified: Secondary | ICD-10-CM | POA: Diagnosis not present

## 2023-11-10 DIAGNOSIS — E119 Type 2 diabetes mellitus without complications: Secondary | ICD-10-CM | POA: Diagnosis not present

## 2023-11-10 DIAGNOSIS — Z9011 Acquired absence of right breast and nipple: Secondary | ICD-10-CM | POA: Diagnosis not present

## 2023-11-10 LAB — IRON AND TIBC
Iron: 50 ug/dL (ref 28–170)
Saturation Ratios: 15 % (ref 10.4–31.8)
TIBC: 340 ug/dL (ref 250–450)
UIBC: 290 ug/dL

## 2023-11-10 LAB — CBC WITH DIFFERENTIAL/PLATELET
Abs Immature Granulocytes: 0.02 10*3/uL (ref 0.00–0.07)
Basophils Absolute: 0.1 10*3/uL (ref 0.0–0.1)
Basophils Relative: 1 %
Eosinophils Absolute: 0.2 10*3/uL (ref 0.0–0.5)
Eosinophils Relative: 3 %
HCT: 36.7 % (ref 36.0–46.0)
Hemoglobin: 11.6 g/dL — ABNORMAL LOW (ref 12.0–15.0)
Immature Granulocytes: 0 %
Lymphocytes Relative: 10 %
Lymphs Abs: 0.7 10*3/uL (ref 0.7–4.0)
MCH: 25.4 pg — ABNORMAL LOW (ref 26.0–34.0)
MCHC: 31.6 g/dL (ref 30.0–36.0)
MCV: 80.5 fL (ref 80.0–100.0)
Monocytes Absolute: 0.5 10*3/uL (ref 0.1–1.0)
Monocytes Relative: 8 %
Neutro Abs: 5.4 10*3/uL (ref 1.7–7.7)
Neutrophils Relative %: 78 %
Platelets: 294 10*3/uL (ref 150–400)
RBC: 4.56 MIL/uL (ref 3.87–5.11)
RDW: 15.9 % — ABNORMAL HIGH (ref 11.5–15.5)
WBC: 6.9 10*3/uL (ref 4.0–10.5)
nRBC: 0 % (ref 0.0–0.2)

## 2023-11-10 LAB — COMPREHENSIVE METABOLIC PANEL WITH GFR
ALT: 15 U/L (ref 0–44)
AST: 21 U/L (ref 15–41)
Albumin: 3.8 g/dL (ref 3.5–5.0)
Alkaline Phosphatase: 52 U/L (ref 38–126)
Anion gap: 11 (ref 5–15)
BUN: 19 mg/dL (ref 8–23)
CO2: 26 mmol/L (ref 22–32)
Calcium: 9.4 mg/dL (ref 8.9–10.3)
Chloride: 102 mmol/L (ref 98–111)
Creatinine, Ser: 1.03 mg/dL — ABNORMAL HIGH (ref 0.44–1.00)
GFR, Estimated: 55 mL/min — ABNORMAL LOW (ref >60)
Glucose, Bld: 92 mg/dL (ref 70–99)
Potassium: 3.7 mmol/L (ref 3.5–5.1)
Sodium: 139 mmol/L (ref 135–145)
Total Bilirubin: 0.5 mg/dL (ref 0.0–1.2)
Total Protein: 6.9 g/dL (ref 6.5–8.1)

## 2023-11-10 LAB — VITAMIN B12: Vitamin B-12: 394 pg/mL (ref 180–914)

## 2023-11-10 LAB — FERRITIN: Ferritin: 71 ng/mL (ref 11–307)

## 2023-11-10 LAB — FOLATE: Folate: 11.4 ng/mL (ref >5.9)

## 2023-11-11 LAB — CEA: CEA: 3.9 ng/mL (ref 0.0–4.7)

## 2023-11-11 LAB — CA 125: Cancer Antigen (CA) 125: 8.4 U/mL (ref 0.0–38.1)

## 2023-11-11 LAB — CANCER ANTIGEN 27.29: CA 27.29: 30 U/mL (ref 0.0–38.6)

## 2023-11-11 LAB — CANCER ANTIGEN 15-3: CA 15-3: 19.4 U/mL (ref 0.0–25.0)

## 2023-11-11 LAB — CANCER ANTIGEN 19-9: CA 19-9: 37 U/mL — ABNORMAL HIGH (ref 0–35)

## 2023-11-13 ENCOUNTER — Encounter: Payer: Self-pay | Admitting: Hematology

## 2023-11-13 DIAGNOSIS — C569 Malignant neoplasm of unspecified ovary: Secondary | ICD-10-CM | POA: Insufficient documentation

## 2023-11-13 DIAGNOSIS — E538 Deficiency of other specified B group vitamins: Secondary | ICD-10-CM | POA: Insufficient documentation

## 2023-11-13 MED ORDER — VITAMIN B-12 1000 MCG PO TABS: 1000.0000 ug | ORAL_TABLET | Freq: Every day | ORAL | 2 refills | Status: AC

## 2023-11-13 NOTE — Assessment & Plan Note (Signed)
 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  Can consider radiation therapy if there is extra capsular extension on pathology review. - Will consider MRI brain if patient symptoms continue to worsen.  Return to clinic in 1 week to discuss further management

## 2023-11-13 NOTE — Progress Notes (Signed)
 Hematology-Oncology Clinic Note  Nancy Neighbors, MD   Reason for Referral: Lymph node biopsy consistent with high-grade serous carcinoma  Oncology History: I have reviewed her chart and materials related to her cancer extensively and collaborated history with the patient. Summary of oncologic history is as follows:  Oncology History  Ductal carcinoma in situ (DCIS) of right breast  01/01/2021 Mammogram   FINDINGS: Multiple circumscribed oval masses are noted in the MEDIAL portion of the RIGHT breast. Masses vary from 1-4 millimeters in diameter and appear contiguous, spanning 8.9 x 3.6 x 5.1 centimeters. There are faint calcifications spanning 4.2 x 1.9 x 3.5 centimeters in this same, segmental distribution.   01/14/2021 Cancer Staging   Staging form: Breast, AJCC 8th Edition - Clinical stage from 01/14/2021: Stage 0 (cTis (DCIS), cN0, cM0, G2, ER+, PR+, HER2: Not Assessed) - Signed by Sonja Maywood, MD on 01/21/2021 Stage prefix: Initial diagnosis Histologic grading system: 3 grade system   01/14/2021 Pathology Results   Diagnosis 1. Breast, right, needle core biopsy, anterior extent - DUCTAL CARCINOMA IN SITU, INTERMEDIATE GRADE WITH CALCIFICATIONS. SEE NOTE 2. Breast, right, needle core biopsy, posterior extent - DUCTAL CARCINOMA IN SITU, INTERMEDIATE GRADE WITH CALCIFICATIONS. SEE NOTE Diagnosis Note 1. and 2. DCIS measures 0.6 cm in part 1 and 0.4 cm in part 2.  1. PROGNOSTIC INDICATORS Results: IMMUNOHISTOCHEMICAL AND MORPHOMETRIC ANALYSIS PERFORMED MANUALLY Estrogen Receptor: >95%, POSITIVE, STRONG STAINING INTENSITY Progesterone Receptor: >95%, POSITIVE, STRONG STAINING INTENSITY     01/17/2021 Initial Diagnosis   Ductal carcinoma in situ (DCIS) of right breast   02/05/2021 Genetic Testing   Negative hereditary cancer genetic testing: no pathogenic variants detected in [facility]-Cancer +RNAinsight Panel.  The report date is February 05, 2021.   The  CustomNext-Cancer+RNAinsight panel offered by [facility] includes sequencing and rearrangement analysis for the following 47 genes:  APC, ATM, AXIN2, BARD1, BMPR1A, BRCA1, BRCA2, BRIP1, CDH1, CDK4, CDKN2A, CHEK2, DICER1, EPCAM, GREM1, HOXB13, MEN1, MLH1, MSH2, MSH3, MSH6, MUTYH, NBN, NF1, NF2, NTHL1, PALB2, PMS2, POLD1, POLE, PTEN, RAD51C, RAD51D, RECQL, RET, SDHA, SDHAF2, SDHB, SDHC, SDHD, SMAD4, SMARCA4, STK11, TP53, TSC1, TSC2, and VHL.  RNA data is routinely analyzed for use in variant interpretation for all genes.   02/20/2021 Cancer Staging   Staging form: Breast, AJCC 8th Edition - Pathologic stage from 02/20/2021: No Stage Recommended (pTis (DCIS), pNX, cM1, G2, ER+, PR+, HER2: Not Assessed) - Signed by Sonja Ashford, MD on 03/12/2021 Stage prefix: Initial diagnosis Histologic grading system: 3 grade system Residual tumor (R): R0 - None   02/20/2021 Definitive Surgery   FINAL MICROSCOPIC DIAGNOSIS:   A. BREAST, RIGHT, MASTECTOMY:  - Multifocal intermediate grade ductal carcinoma in situ with calcifications.  - Atypical lobular hyperplasia.  - Margins are negative for carcinoma.  - Biopsy site (x2).  - See oncology table.    High grade serous carcinoma (HCC)  10/01/2023 PET scan   IMPRESSION: 1. Hypermetabolic left inguinal lymph node, maximum SUV 9.2. Given the high SUV, neoplastic involvement is a distinct possibility tissue sampling should be considered. 2. Multifocal accentuated activity in the colon is likely physiologic given the lack of correlate of CT findings, although technically nonspecific. 3. Right mastectomy.   10/21/2023 Pathology Results    FINAL MICROSCOPIC DIAGNOSIS:   A. LYMPH NODE, INGUINAL, BIOPSY:  - Metastatic poorly differentiated adenocarcinoma, see comment   Immunohistochemical stains show that the tumor cells are positive for  CK7 and PAX8 with patchy, weak labeling for CK5/6 and p16.  Immunostains for  CK20, CDX2, GATA3 and p63 are negative.  This  immunoprofile is most  consistent with a gynecologic primary while differential diagnosis can  include a primary renal cell carcinoma but is less likely.   Immunohistochemical stain for p53 shows a clonal loss of expression pattern, most consistent with metastatic high-grade serous carcinoma.    11/13/2023 Initial Diagnosis   High grade serous carcinoma (HCC)       History of Presenting Illness: Nancy Liu 83 y.o. female is here for lymph node biopsy consistent with high-grade serous carcinoma.  Patient is accompanied by her daughter, son and sister today.  She previously presented with severe weight loss but currently has started gaining weight after the surgery-lymphadenectomy.    She describes a new onset of balance issues, experiencing a staggering gait for about a month, and has started using a cane. She also reports difficulty with words 'running together' when reading and trouble focusing, though she denies blurry vision. These symptoms have worsened over the last month.  She has experienced recent weight loss but has gained four to five pounds since her biopsy. Her appetite has improved, and she mentions a medication that helped with her appetite and rest, but it caused significant drowsiness, leading her to discontinue its use as her appetite improved.  She experiences some incontinence, describing a sensation of gas that turns out to be feces, and frequently goes to the bathroom after eating. No blood in her stools.  She denies fevers, chills, abdominal pain, nausea, vomiting, diarrhea.  Overall, has improved from prior to lymphadenectomy.  Medical History: Past Medical History:  Diagnosis Date   Arteriosclerotic cardiovascular disease (ASCVD) 2001   Bare-metal stent placed in the right coronary artery in 12/01; residual 50% lesion of the first diagonal and mid LAD   Breast cancer (HCC)    Cerebrovascular disease    COPD (chronic obstructive pulmonary disease) (HCC)     Coronary artery disease    Cyst, dermoid, arm, left 03/26/2018   Diabetes mellitus    excellent control with a low-dose of a single oral agent   Family history of breast cancer 01/23/2021   Family history of ovarian cancer 01/23/2021   GERD (gastroesophageal reflux disease)    with ulcers   History of right coronary artery stent placement 2000   Hyperlipidemia    Hypertension    Sleep apnea    Thalassemia minor    Tobacco abuse, in remission    35 pack years; Quit in 1980    Surgical history: Past Surgical History:  Procedure Laterality Date   BIOPSY  02/28/2020   Procedure: BIOPSY;  Surgeon: Vinetta Greening, DO;  Location: [facility];  Service: Endoscopy;;   CARDIAC CATHETERIZATION     stent   COLONOSCOPY  2006   Dr. Tova Fresh: normal   COLONOSCOPY  2000   Dr. Carmelita Ching: normal    COLONOSCOPY N/A 03/03/2016   Dr. Nolene Baumgarten: diverticulosis, non-bleeding hemorrhoids, redundant left colon.   DILATION AND CURETTAGE OF UTERUS     ESOPHAGOGASTRODUODENOSCOPY  2000   Dr. Carmelita Ching: gastritis    ESOPHAGOGASTRODUODENOSCOPY  2006   Dr. Tova Fresh: small hiatal hernia, gastritis, negative H.pylori    ESOPHAGOGASTRODUODENOSCOPY N/A 03/03/2016   Procedure: ESOPHAGOGASTRODUODENOSCOPY (EGD);  Surgeon: Alyce Jubilee, MD;  Location: [facility];  Service: Endoscopy;  Laterality: N/A;   ESOPHAGOGASTRODUODENOSCOPY N/A 06/03/2016   Dr. Nolene Baumgarten: nonaggressive gastritis due to aspirin  use. Previous ulcers had healed.   ESOPHAGOGASTRODUODENOSCOPY (EGD) WITH PROPOFOL  N/A 02/28/2020   focal  inflammation in the gastric antrum consistent with gastritis on biopsies. No H.pylori.    EXCISION OF KELOID Left 03/26/2018   Procedure: EXCISION OF LEFT ARM MASS;  Surgeon: Boyce Byes, MD;  Location: [facility];  Service: General;  Laterality: Left;   GIVENS CAPSULE STUDY N/A 10/10/2020   Procedure: GIVENS CAPSULE STUDY;  Surgeon: Vinetta Greening, DO;  Location: [facility];  Service: Endoscopy;   Laterality: N/A;  7:30am   INGUINAL HERNIA REPAIR  1970s   Right   INGUINAL LYMPH NODE BIOPSY Left 10/21/2023   Procedure: BIOPSY, LYMPH NODE, INGUINAL, OPEN;  Surgeon: Marijo Shove, DO;  Location: [facility];  Service: General;  Laterality: Left;   TOTAL MASTECTOMY Right 02/20/2021   Procedure: RIGHT TOTAL MASTECTOMY;  Surgeon: Enid Harry, MD;  Location[facility];  Service: General;  Laterality: Right;   VAGINAL HYSTERECTOMY  1980   Unilateral oophorectomy     Allergies:  is allergic to protonix  [pantoprazole  sodium].  Medications:  Current Outpatient Medications  Medication Sig Dispense Refill   amLODipine  (NORVASC ) 10 MG tablet TAKE ONE TABLET BY MOUTH EVERYDAY AT BEDTIME (Patient taking differently: Take 10 mg by mouth daily.) 90 tablet 3   aspirin  EC 81 MG tablet Take 1 tablet (81 mg total) by mouth every 4 (four) hours as needed for moderate pain (pain score 4-6) (Pain).     carvedilol  (COREG ) 6.25 MG tablet TAKE ONE TABLET BY MOUTH BEFORE BREAKFAST and TAKE ONE TABLET BY MOUTH EVERYDAY AT BEDTIME 60 tablet 2   Cholecalciferol (VITAMIN D3) 50 MCG (2000 UT) TABS TAKE ONE TABLET BY MOUTH daily in addition TO THE 1,000unit FOR total of 3,000units daily (Patient taking differently: Take 2,000 Units by mouth 2 (two) times a week.) 30 tablet 1   docusate sodium  (COLACE) 100 MG capsule Take 1 capsule (100 mg total) by mouth 2 (two) times daily. 60 capsule 2   donepezil  (ARICEPT ) 5 MG tablet Take 1 tablet (5 mg total) by mouth at bedtime. 30 tablet 3   feeding supplement (ENSURE ENLIVE / ENSURE PLUS) LIQD Take 237 mLs by mouth 3 (three) times daily between meals. (Patient taking differently: Take 237 mLs by mouth 2 (two) times daily between meals.) 237 mL 12   irbesartan -hydrochlorothiazide  (AVALIDE) 300-12.5 MG tablet Take 1 tablet by mouth daily before breakfast. 90 tablet 3   metFORMIN  (GLUCOPHAGE ) 500 MG tablet Take 1 tablet (500 mg total) by mouth at bedtime. 30 tablet 0    mirtazapine  (REMERON ) 7.5 MG tablet Take 1 tablet (7.5 mg total) by mouth at bedtime. For depression and weight loss. 30 tablet 3   ONETOUCH ULTRA test strip SMARTSIG:Strip(s)     oxyCODONE  (ROXICODONE ) 5 MG immediate release tablet Take 1 tablet (5 mg total) by mouth every 6 (six) hours as needed. 4 tablet 0   rosuvastatin  (CRESTOR ) 20 MG tablet TAKE ONE TABLET BY MOUTH EVERYDAY AT BEDTIME 90 tablet 3   triamcinolone ointment (KENALOG) 0.5 % Apply 1 application  topically 2 (two) times daily as needed (rash).     No current facility-administered medications for this visit.    Review of Systems: Constitutional: Denies fevers, chills or abnormal night sweats Eyes: Denies blurriness of vision, double vision or watery eyes Ears, nose, mouth, throat, and face: Denies mucositis or sore throat Respiratory: Denies cough, dyspnea or wheezes Cardiovascular: Denies palpitation, chest discomfort or lower extremity swelling Gastrointestinal:  Denies nausea, heartburn or change in bowel habits Skin: Denies abnormal skin rashes Lymphatics: Denies  new lymphadenopathy or easy bruising Neurological:Denies numbness, tingling or new weaknesses Behavioral/Psych: Mood is stable, no new changes  All other systems were reviewed with the patient and are negative.  Physical Examination: ECOG PERFORMANCE STATUS: 2 - Symptomatic, <50% confined to bed  Vitals:   11/10/23 0954  BP: 124/67  Pulse: 90  Resp: 18  Temp: 98.1 F (36.7 C)  SpO2: 100%   Filed Weights   11/10/23 0954  Weight: 135 lb 9.3 oz (61.5 kg)    GENERAL:alert, no distress and comfortable SKIN: skin color, texture, turgor are normal, no rashes or significant lesions LYMPH:  no palpable lymphadenopathy in the cervical, axillary or inguinal LUNGS: clear to auscultation and percussion with normal breathing effort HEART: regular rate & rhythm and no murmurs and no lower extremity edema ABDOMEN:abdomen soft, non-tender and normal bowel  sounds Musculoskeletal:no cyanosis of digits and no clubbing  PSYCH: alert & oriented x 3 with fluent speech NEURO: no focal motor/sensory deficits   Laboratory Data: I have reviewed the data as listed Lab Results  Component Value Date   WBC 6.9 11/10/2023   HGB 11.6 (L) 11/10/2023   HCT 36.7 11/10/2023   MCV 80.5 11/10/2023   PLT 294 11/10/2023   Recent Labs    08/11/23 1342 09/04/23 1446 09/05/23 0504 09/06/23 0447 10/19/23 1322 11/10/23 1035  NA 138 131*   < > 137 135 139  K 4.3 3.8   < > 3.8 3.8 3.7  CL 98 92*   < > 100 102 102  CO2 28 28   < > 29 27 26   GLUCOSE 95 98   < > 100* 88 92  BUN 25* 41*   < > 28* 15 19  CREATININE 1.23* 2.24*   < > 1.30* 0.93 1.03*  CALCIUM  9.5 9.0   < > 9.0 9.1 9.4  GFRNONAA 44* 22*   < > 41* >60 55*  PROT 6.9 6.5  --   --   --  6.9  ALBUMIN  3.8 3.6  --   --   --  3.8  AST 27 30  --   --   --  21  ALT 20 18  --   --   --  15  ALKPHOS 50 41  --   --   --  52  BILITOT 0.5 0.5  --   --   --  0.5   < > = values in this interval not displayed.    Radiographic Studies: I have personally reviewed the radiological images as listed and agreed with the findings in the report.  MM 3D SCREENING MAMMOGRAM UNILATERAL LEFT BREAST CLINICAL DATA:  Screening.  EXAM: DIGITAL SCREENING UNILATERAL LEFT MAMMOGRAM WITH CAD AND TOMOSYNTHESIS  TECHNIQUE: Left screening digital craniocaudal and mediolateral oblique mammograms were obtained. Left screening digital breast tomosynthesis was performed. The images were evaluated with computer-aided detection.  COMPARISON:  Previous exam(s).  ACR Breast Density Category b: There are scattered areas of fibroglandular density.  FINDINGS: There are no findings suspicious for malignancy.  IMPRESSION: No mammographic evidence of malignancy. A result letter of this screening mammogram will be mailed directly to the patient.  RECOMMENDATION: Screening mammogram in one year. (Code:[id number])  BI-RADS  CATEGORY  1: Negative.  Electronically Signed   By: Sundra Engel M.D.   On: 10/01/2023 15:41 NM PET Image Initial (PI) Skull Base To Thigh CLINICAL DATA:  Initial treatment strategy for unintentional weight loss, history of right breast cancer  EXAM: NUCLEAR MEDICINE  PET SKULL BASE TO THIGH  TECHNIQUE: 6.4 mCi F-18 FDG was injected intravenously. Full-ring PET imaging was performed from the skull base to thigh after the radiotracer. CT data was obtained and used for attenuation correction and anatomic localization.  Fasting blood glucose: 111 mg/dl  COMPARISON:  CT scan 16/03/9603  FINDINGS: Mediastinal blood pool activity: SUV max 2.4  Liver activity: SUV max NA  NECK: No significant abnormal hypermetabolic activity in this region.  Incidental CT findings: Bilateral common carotid atherosclerosis.  CHEST: No significant abnormal hypermetabolic activity in this region.  Incidental CT findings: Right mastectomy. Coronary, aortic arch, and branch vessel atherosclerotic vascular disease.  ABDOMEN/PELVIS: Hypermetabolic left inguinal lymph node 1.2 cm in short axis on image 129 series 202, maximum SUV 9.2. Multifocal accentuated activity in the colon is likely physiologic given the lack of correlate of CT findings, although technically nonspecific.  Incidental CT findings: Atherosclerosis is present, including aortoiliac atherosclerotic disease.  SKELETON: No significant abnormal hypermetabolic activity in this region.  Incidental CT findings: Grade 1 anterolisthesis at L4-5.  IMPRESSION: 1. Hypermetabolic left inguinal lymph node, maximum SUV 9.2. Given the high SUV, neoplastic involvement is a distinct possibility tissue sampling should be considered. 2. Multifocal accentuated activity in the colon is likely physiologic given the lack of correlate of CT findings, although technically nonspecific. 3. Right mastectomy. 4. Coronary, aortic arch, systemic, and  branch vessel atherosclerotic vascular disease. Aortic Atherosclerosis (ICD10-I70.0). 5. Grade 1 anterolisthesis at L4-5.  Electronically Signed   By: Freida Jes M.D.   On: 10/01/2023 12:51    ASSESSMENT & PLAN:  Patient is an 83 year old female referred for late recent lymphadenectomy and consistent with high-grade serous carcinoma   High grade serous carcinoma (HCC) Patient with recent excisional biopsy of inguinal lymph node consistent with high-grade serous carcinoma PET scan with no other sites of disease  - Will send for cancer type ID to evaluate for tissue origin - Will discuss patient at tumor board for pathology review and further commendations - Will order tumor marker tests today including CEA, CA 19-9,CA125 and breast cancer markers - Consider colonoscopy if recommended at tumor board -Considering patient had only 1 site of disease and had an excisional biopsy done, I do not believe patient requires any further systemic therapy.  Can consider radiation therapy if there is extra capsular extension on pathology review. - Will consider MRI brain if patient symptoms continue to worsen.  Return to clinic in 1 week to discuss further management  Ductal carcinoma in situ (DCIS) of right breast Cyst patient has a history of DCIS diagnosed in 2022 s/p mastectomy.  ER/PR positive.  Did not receive endocrine therapy.  Microcytic anemia Labs consistent with mild microcytic anemia secondary to iron  deficiency  - Continue oral iron  pills every other day  Will repeat labs in 3 months  Vitamin B12 deficiency Patient has mild vitamin B12 deficiency with levels less than 400  - Start oral vitamin B12 1000 mcg daily   Orders Placed This Encounter  Procedures   Cancer antigen 15-3    Standing Status:   Future    Number of Occurrences:   1    Expected Date:   11/10/2023    Expiration Date:   11/09/2024   Cancer antigen 19-9    Standing Status:   Future    Number of  Occurrences:   1    Expected Date:   11/10/2023    Expiration Date:   11/09/2024   CA 125  Standing Status:   Future    Number of Occurrences:   1    Expected Date:   11/10/2023    Expiration Date:   11/09/2024   Cancer antigen 27.29    Standing Status:   Future    Number of Occurrences:   1    Expected Date:   11/10/2023    Expiration Date:   11/09/2024   CBC with Differential/Platelet    Standing Status:   Future    Number of Occurrences:   1    Expected Date:   11/10/2023    Expiration Date:   11/09/2024   Comprehensive metabolic panel with GFR    Standing Status:   Future    Number of Occurrences:   1    Expected Date:   11/10/2023    Expiration Date:   11/09/2024   Ferritin    Standing Status:   Future    Number of Occurrences:   1    Expected Date:   11/10/2023    Expiration Date:   11/09/2024   Vitamin B12    Standing Status:   Future    Number of Occurrences:   1    Expected Date:   11/10/2023    Expiration Date:   11/09/2024   Folate    Standing Status:   Future    Number of Occurrences:   1    Expected Date:   11/10/2023    Expiration Date:   11/09/2024   Iron  and TIBC    Standing Status:   Future    Number of Occurrences:   1    Expected Date:   11/10/2023    Expiration Date:   11/09/2024   CEA    Standing Status:   Future    Number of Occurrences:   1    Expected Date:   11/10/2023    Expiration Date:   11/09/2024    The total time spent in the appointment was 60 minutes encounter with patients including review of chart and various tests results, discussions about plan of care and coordination of care plan   All questions were answered. The patient knows to call the clinic with any problems, questions or concerns. No barriers to learning was detected.  Eduardo Grade, MD 6/13/20254:54 PM

## 2023-11-13 NOTE — Assessment & Plan Note (Signed)
 Patient has mild vitamin B12 deficiency with levels less than 400  - Start oral vitamin B12 1000 mcg daily

## 2023-11-13 NOTE — Assessment & Plan Note (Signed)
 Cyst patient has a history of DCIS diagnosed in 2022 s/p mastectomy.  ER/PR positive.  Did not receive endocrine therapy.

## 2023-11-13 NOTE — Assessment & Plan Note (Signed)
 Labs consistent with mild microcytic anemia secondary to iron  deficiency  - Continue oral iron  pills every other day  Will repeat labs in 3 months

## 2023-11-20 ENCOUNTER — Encounter (HOSPITAL_COMMUNITY): Payer: Self-pay | Admitting: Oncology

## 2023-11-20 DIAGNOSIS — C800 Disseminated malignant neoplasm, unspecified: Secondary | ICD-10-CM | POA: Diagnosis not present

## 2023-11-23 ENCOUNTER — Ambulatory Visit: Payer: PPO | Admitting: Neurology

## 2023-11-23 ENCOUNTER — Other Ambulatory Visit: Payer: Self-pay | Admitting: Oncology

## 2023-11-23 NOTE — Progress Notes (Signed)
 Gynecologic Oncology Multi-Disciplinary Disposition Conference Note  Date of the Conference: 11/23/2023  Patient Name: Nancy Liu     Stage/Disposition: High-grade serous carcinoma consistent with a Gyn primary. Disposition is to referral to [facility] for consideration of surgery (debulk lymph node, pelvic organs to try to find cancer source). If patient does not want surgery, then consideration for radiation and/or chemotherapy.    This Multidisciplinary conference took place involving physicians from Gynecologic Oncology, Medical Oncology, Radiation Oncology, Pathology, Radiology along with the Gynecologic Oncology Nurse Practitioner and Gynecologic Oncology Nurse Navigator.  Comprehensive assessment of the patient's malignancy, staging, need for surgery, chemotherapy, radiation therapy, and need for further testing were reviewed. Supportive measures, both inpatient and following discharge were also discussed. The recommended plan of care is documented. Greater than 35 minutes were spent correlating and coordinating this patient's care.

## 2023-11-24 ENCOUNTER — Inpatient Hospital Stay: Admitting: Oncology

## 2023-11-24 VITALS — BP 122/64 | HR 91 | Temp 97.5°F | Resp 20 | Wt 136.7 lb

## 2023-11-24 DIAGNOSIS — C569 Malignant neoplasm of unspecified ovary: Secondary | ICD-10-CM

## 2023-11-24 DIAGNOSIS — D0511 Intraductal carcinoma in situ of right breast: Secondary | ICD-10-CM | POA: Diagnosis not present

## 2023-11-24 DIAGNOSIS — D649 Anemia, unspecified: Secondary | ICD-10-CM | POA: Diagnosis not present

## 2023-11-24 DIAGNOSIS — E538 Deficiency of other specified B group vitamins: Secondary | ICD-10-CM

## 2023-11-24 DIAGNOSIS — C774 Secondary and unspecified malignant neoplasm of inguinal and lower limb lymph nodes: Secondary | ICD-10-CM | POA: Diagnosis not present

## 2023-11-24 MED ORDER — FERROUS SULFATE 325 (65 FE) MG PO TBEC: 325.0000 mg | DELAYED_RELEASE_TABLET | ORAL | 3 refills | Status: AC

## 2023-11-24 NOTE — Assessment & Plan Note (Signed)
 Patient has mild vitamin B12 deficiency with levels less than 400  - Continue oral vitamin B12 1000 mcg daily

## 2023-11-24 NOTE — Assessment & Plan Note (Addendum)
 Patient has mild anemia with very mild iron  deficiency  - Start taking oral iron  every other day.  Use MiraLAX  for constipation

## 2023-11-24 NOTE — Patient Instructions (Signed)
 VISIT SUMMARY:  Today, we discussed your suspected ovarian cancer, mild anemia, constipation, and mild dehydration. You are doing well with your current activities and have a good appetite, which is great to hear.  YOUR PLAN:  -OVARIAN CANCER: You have a high likelihood of ovarian cancer based on your tests. The tumor board recommends surgery, so we are referring you to a gynecologic oncologist in [place] for further evaluation and possible surgery. We will also communicate with the surgical team about your past hysterectomy to clarify if your ovaries were removed.  -ANEMIA: You have mild anemia, which means your red blood cell count is slightly low. We are prescribing iron  pills to be taken every other day and suggest taking them with vitamin C to help your body absorb the iron  better. To prevent constipation from the iron , we recommend taking Miralax .  -CONSTIPATION: You are experiencing less frequent bowel movements, possibly due to the iron  supplements. We recommend taking Miralax  daily to help with this.  -DEHYDRATION: You have mild dehydration, likely from not drinking enough fluids. We encourage you to increase your water  intake.  INSTRUCTIONS:  Please follow up with the gynecologic oncologist in [place] as soon as possible for your surgical evaluation. Continue taking your iron  pills every other day with vitamin C and use Miralax  daily to prevent constipation. Make sure to drink plenty of water  to stay hydrated.

## 2023-11-24 NOTE — Assessment & Plan Note (Addendum)
 Patient with recent excisional biopsy of inguinal lymph node consistent with high-grade serous carcinoma PET scan with no other sites of disease Identified lesion just above ovarian serous adenocarcinoma 90% probability CEA, CA125: Normal, CA 19-9: Slightly elevated S/p hysterectomy in 1980, unknown if she had oophorectomy at that time Discussed with tumor board and recommendation was to refer to GYN ONC for surgical evaluation  - Will refer to GYN ONC for surgical evaluation  Return to clinic after GYN ONC evaluation

## 2023-11-24 NOTE — Assessment & Plan Note (Addendum)
 Patient has a history of DCIS of right breast diagnosed in 2022 s/p mastectomy.  ER/PR positive.   Did not receive endocrine therapy. Mammogram 10/2022 with no evidence of disease in the left breast

## 2023-11-24 NOTE — Progress Notes (Signed)
 Hematology-Oncology Clinic Note  Benjamine Aland, MD   Reason for Referral: Lymph node biopsy consistent with high-grade serous carcinoma  Oncology History: I have reviewed her chart and materials related to her cancer extensively and collaborated history with the patient. Summary of oncologic history is as follows:  Oncology History  Ductal carcinoma in situ (DCIS) of right breast  01/01/2021 Mammogram   FINDINGS: Multiple circumscribed oval masses are noted in the MEDIAL portion of the RIGHT breast. Masses vary from 1-4 millimeters in diameter and appear contiguous, spanning 8.9 x 3.6 x 5.1 centimeters. There are faint calcifications spanning 4.2 x 1.9 x 3.5 centimeters in this same, segmental distribution.   01/14/2021 Cancer Staging   Staging form: Breast, AJCC 8th Edition - Clinical stage from 01/14/2021: Stage 0 (cTis (DCIS), cN0, cM0, G2, ER+, PR+, HER2: Not Assessed) - Signed by [name], MD on 01/21/2021 Stage prefix: Initial diagnosis Histologic grading system: 3 grade system   01/14/2021 Pathology Results   Diagnosis 1. Breast, right, needle core biopsy, anterior extent - DUCTAL CARCINOMA IN SITU, INTERMEDIATE GRADE WITH CALCIFICATIONS. SEE NOTE 2. Breast, right, needle core biopsy, posterior extent - DUCTAL CARCINOMA IN SITU, INTERMEDIATE GRADE WITH CALCIFICATIONS. SEE NOTE Diagnosis Note 1. and 2. DCIS measures 0.6 cm in part 1 and 0.4 cm in part 2.  1. PROGNOSTIC INDICATORS Results: IMMUNOHISTOCHEMICAL AND MORPHOMETRIC ANALYSIS PERFORMED MANUALLY Estrogen Receptor: >95%, POSITIVE, STRONG STAINING INTENSITY Progesterone Receptor: >95%, POSITIVE, STRONG STAINING INTENSITY     01/17/2021 Initial Diagnosis   Ductal carcinoma in situ (DCIS) of right breast   02/05/2021 Genetic Testing   Negative hereditary cancer genetic testing: no pathogenic variants detected in [employer] CustomNext-Cancer +RNAinsight Panel.  The report date is February 05, 2021.   The  CustomNext-Cancer+RNAinsight panel offered by [employer] includes sequencing and rearrangement analysis for the following 47 genes:  APC, ATM, AXIN2, BARD1, BMPR1A, BRCA1, BRCA2, BRIP1, CDH1, CDK4, CDKN2A, CHEK2, DICER1, EPCAM, GREM1, HOXB13, MEN1, MLH1, MSH2, MSH3, MSH6, MUTYH, NBN, NF1, NF2, NTHL1, PALB2, PMS2, POLD1, POLE, PTEN, RAD51C, RAD51D, RECQL, RET, SDHA, SDHAF2, SDHB, SDHC, SDHD, SMAD4, SMARCA4, STK11, TP53, TSC1, TSC2, and VHL.  RNA data is routinely analyzed for use in variant interpretation for all genes.   02/20/2021 Cancer Staging   Staging form: Breast, AJCC 8th Edition - Pathologic stage from 02/20/2021: No Stage Recommended (pTis (DCIS), pNX, cM1, G2, ER+, PR+, HER2: Not Assessed) - Signed by [name], MD on 03/12/2021 Stage prefix: Initial diagnosis Histologic grading system: 3 grade system Residual tumor (R): R0 - None   02/20/2021 Definitive Surgery   FINAL MICROSCOPIC DIAGNOSIS:   A. BREAST, RIGHT, MASTECTOMY:  - Multifocal intermediate grade ductal carcinoma in situ with calcifications.  - Atypical lobular hyperplasia.  - Margins are negative for carcinoma.  - Biopsy site (x2).  - See oncology table.    High grade serous carcinoma (HCC)  10/01/2023 PET scan   IMPRESSION: 1. Hypermetabolic left inguinal lymph node, maximum SUV 9.2. Given the high SUV, neoplastic involvement is a distinct possibility tissue sampling should be considered. 2. Multifocal accentuated activity in the colon is likely physiologic given the lack of correlate of CT findings, although technically nonspecific. 3. Right mastectomy.   10/21/2023 Pathology Results    FINAL MICROSCOPIC DIAGNOSIS:   A. LYMPH NODE, INGUINAL, BIOPSY:  - Metastatic poorly differentiated adenocarcinoma, see comment   Immunohistochemical stains show that the tumor cells are positive for  CK7 and PAX8 with patchy, weak labeling for CK5/6 and p16.  Immunostains for  CK20, CDX2, GATA3 and p63 are negative.  This  immunoprofile is most  consistent with a gynecologic primary while differential diagnosis can  include a primary renal cell carcinoma but is less likely.   Immunohistochemical stain for p53 shows a clonal loss of expression pattern, most consistent with metastatic high-grade serous carcinoma.    11/10/2023 Cancer Staging   Staging form: Ovary, AJCC 7th Edition - Clinical stage from 11/10/2023: Stage IIIC (TX, N1, M0) - Signed by Davonna Siad, MD on 11/13/2023 Stage prefix: Initial diagnosis Source of metastatic specimen: Inguinal Lymph Node Histologic grade (G): G4 Lymph-vascular invasion (LVI): Presence of LVI unknown/indeterminate   11/13/2023 Initial Diagnosis   High grade serous carcinoma (HCC)   11/19/2023 Pathology Results   Cancer type ID: Ovary, 90% probability Serous adenocarcinoma         History of Presenting Illness: Nancy Liu 83 y.o. female is here for lymph node biopsy consistent with high-grade serous carcinoma.  Patient is accompanied by her daughter on the phone, son and sister today.  Patient stated that she is feeling well and has started gaining weight.  Her appetite is good with Remeron  and does not report any other complaints today.  Her gait has been staggering but has been stable and not worsening.  She is working with physical therapy every week and is gaining strength.   Of note, patient had a vaginal hysterectomy in 1980.  Unsure if she had oophorectomy at that time or not.  We discussed that the tumor board recommendation was to have a GYN ONC evaluation with a probable surgery.  She had her family in agreement with this plan.  Medical History: Past Medical History:  Diagnosis Date   Arteriosclerotic cardiovascular disease (ASCVD) 2001   Bare-metal stent placed in the right coronary artery in 12/01; residual 50% lesion of the first diagonal and mid LAD   Breast cancer (HCC)    Cerebrovascular disease    COPD (chronic obstructive pulmonary  disease) (HCC)    Coronary artery disease    Cyst, dermoid, arm, left 03/26/2018   Diabetes mellitus    excellent control with a low-dose of a single oral agent   Family history of breast cancer 01/23/2021   Family history of ovarian cancer 01/23/2021   GERD (gastroesophageal reflux disease)    with ulcers   History of right coronary artery stent placement 2000   Hyperlipidemia    Hypertension    Sleep apnea    Thalassemia minor    Tobacco abuse, in remission    35 pack years; Quit in 1980    Surgical history: Past Surgical History:  Procedure Laterality Date   BIOPSY  02/28/2020   Procedure: BIOPSY;  Surgeon: [name], DO;  Location: [facility];  Service: Endoscopy;;   CARDIAC CATHETERIZATION     stent   COLONOSCOPY  2006   Dr. Kristie: normal   COLONOSCOPY  2000   Dr. Gardiner: normal    COLONOSCOPY N/A 03/03/2016   Dr. Harvey: diverticulosis, non-bleeding hemorrhoids, redundant left colon.   DILATION AND CURETTAGE OF UTERUS     ESOPHAGOGASTRODUODENOSCOPY  2000   Dr. Gardiner: gastritis    ESOPHAGOGASTRODUODENOSCOPY  2006   Dr. Kristie: small hiatal hernia, gastritis, negative H.pylori    ESOPHAGOGASTRODUODENOSCOPY N/A 03/03/2016   Procedure: ESOPHAGOGASTRODUODENOSCOPY (EGD);  Surgeon: Margo LITTIE Harvey, MD;  Location: [facility];  Service: Endoscopy;  Laterality: N/A;   ESOPHAGOGASTRODUODENOSCOPY N/A 06/03/2016   Dr. Harvey: nonaggressive gastritis due to aspirin  use.  Previous ulcers had healed.   ESOPHAGOGASTRODUODENOSCOPY (EGD) WITH PROPOFOL  N/A 02/28/2020   focal inflammation in the gastric antrum consistent with gastritis on biopsies. No H.pylori.    EXCISION OF KELOID Left 03/26/2018   Procedure: EXCISION OF LEFT ARM MASS;  Surgeon: Gail Favorite, MD;  Location: [facility];  Service: General;  Laterality: Left;   GIVENS CAPSULE STUDY N/A 10/10/2020   Procedure: GIVENS CAPSULE STUDY;  Surgeon: [name], DO;  Location: [facility];   Service: Endoscopy;  Laterality: N/A;  7:30am   INGUINAL HERNIA REPAIR  1970s   Right   INGUINAL LYMPH NODE BIOPSY Left 10/21/2023   Procedure: BIOPSY, LYMPH NODE, INGUINAL, OPEN;  Surgeon: Evonnie Dorothyann LABOR, DO;  Location: [facility];  Service: General;  Laterality: Left;   TOTAL MASTECTOMY Right 02/20/2021   Procedure: RIGHT TOTAL MASTECTOMY;  Surgeon: Ebbie Cough, MD;  Location: [facility];  Service: General;  Laterality: Right;   VAGINAL HYSTERECTOMY  1980   Unilateral oophorectomy   Allergies:  is allergic to protonix  [pantoprazole  sodium].  Medications:  Current Outpatient Medications  Medication Sig Dispense Refill   amLODipine  (NORVASC ) 10 MG tablet TAKE ONE TABLET BY MOUTH EVERYDAY AT BEDTIME (Patient taking differently: Take 10 mg by mouth daily.) 90 tablet 3   aspirin  EC 81 MG tablet Take 1 tablet (81 mg total) by mouth every 4 (four) hours as needed for moderate pain (pain score 4-6) (Pain).     carvedilol  (COREG ) 6.25 MG tablet TAKE ONE TABLET BY MOUTH BEFORE BREAKFAST and TAKE ONE TABLET BY MOUTH EVERYDAY AT BEDTIME 60 tablet 2   Cholecalciferol (VITAMIN D3) 50 MCG (2000 UT) TABS TAKE ONE TABLET BY MOUTH daily in addition TO THE 1,000unit FOR total of 3,000units daily (Patient taking differently: Take 2,000 Units by mouth 2 (two) times a week.) 30 tablet 1   cyanocobalamin  (VITAMIN B12) 1000 MCG tablet Take 1 tablet (1,000 mcg total) by mouth daily. 90 tablet 2   docusate sodium  (COLACE) 100 MG capsule Take 1 capsule (100 mg total) by mouth 2 (two) times daily. 60 capsule 2   donepezil  (ARICEPT ) 5 MG tablet Take 1 tablet (5 mg total) by mouth at bedtime. 30 tablet 3   feeding supplement (ENSURE ENLIVE / ENSURE PLUS) LIQD Take 237 mLs by mouth 3 (three) times daily between meals. (Patient taking differently: Take 237 mLs by mouth 2 (two) times daily between meals.) 237 mL 12   ferrous sulfate  325 (65 FE) MG EC tablet Take 1 tablet (325 mg total) by mouth every other day. 45  tablet 3   irbesartan -hydrochlorothiazide  (AVALIDE) 300-12.5 MG tablet Take 1 tablet by mouth daily before breakfast. 90 tablet 3   metFORMIN  (GLUCOPHAGE ) 500 MG tablet Take 1 tablet (500 mg total) by mouth at bedtime. 30 tablet 0   mirtazapine  (REMERON ) 7.5 MG tablet Take 1 tablet (7.5 mg total) by mouth at bedtime. For depression and weight loss. 30 tablet 3   ONETOUCH ULTRA test strip SMARTSIG:Strip(s)     oxyCODONE  (ROXICODONE ) 5 MG immediate release tablet Take 1 tablet (5 mg total) by mouth every 6 (six) hours as needed. 4 tablet 0   rosuvastatin  (CRESTOR ) 20 MG tablet TAKE ONE TABLET BY MOUTH EVERYDAY AT BEDTIME 90 tablet 3   triamcinolone ointment (KENALOG) 0.5 % Apply 1 application  topically 2 (two) times daily as needed (rash).     No current facility-administered medications for this visit.    Review of Systems: Constitutional: Denies fevers, chills  or abnormal night sweats Eyes: Denies blurriness of vision, double vision or watery eyes Ears, nose, mouth, throat, and face: Denies mucositis or sore throat Respiratory: Denies cough, dyspnea or wheezes Cardiovascular: Denies palpitation, chest discomfort or lower extremity swelling Gastrointestinal:  Denies nausea, heartburn or change in bowel habits Skin: Denies abnormal skin rashes Lymphatics: Denies new lymphadenopathy or easy bruising Neurological:Denies numbness, tingling or new weaknesses Behavioral/Psych: Mood is stable, no new changes  All other systems were reviewed with the patient and are negative.  Physical Examination: ECOG PERFORMANCE STATUS: 2 - Symptomatic, <50% confined to bed  Vitals:   11/24/23 1048  BP: 122/64  Pulse: 91  Resp: 20  Temp: (!) 97.5 F (36.4 C)  SpO2: 93%    Filed Weights   11/24/23 1048  Weight: 136 lb 11 oz (62 kg)    GENERAL:alert, no distress and comfortable SKIN: skin color, texture, turgor are normal, no rashes or significant lesions LYMPH:  no palpable lymphadenopathy in  the cervical, axillary or inguinal LUNGS: clear to auscultation and percussion with normal breathing effort HEART: regular rate & rhythm and no murmurs and bilateral pitting pedal edema extending up to the ankle ABDOMEN:abdomen soft, non-tender and normal bowel sounds Musculoskeletal:no cyanosis of digits and no clubbing  PSYCH: alert & oriented x 3 with fluent speech   Laboratory Data: I have reviewed the data as listed Lab Results  Component Value Date   WBC 6.9 11/10/2023   HGB 11.6 (L) 11/10/2023   HCT 36.7 11/10/2023   MCV 80.5 11/10/2023   PLT 294 11/10/2023   Recent Labs    08/11/23 1342 09/04/23 1446 09/05/23 0504 09/06/23 0447 10/19/23 1322 11/10/23 1035  NA 138 131*   < > 137 135 139  K 4.3 3.8   < > 3.8 3.8 3.7  CL 98 92*   < > 100 102 102  CO2 28 28   < > 29 27 26   GLUCOSE 95 98   < > 100* 88 92  BUN 25* 41*   < > 28* 15 19  CREATININE 1.23* 2.24*   < > 1.30* 0.93 1.03*  CALCIUM  9.5 9.0   < > 9.0 9.1 9.4  GFRNONAA 44* 22*   < > 41* >60 55*  PROT 6.9 6.5  --   --   --  6.9  ALBUMIN  3.8 3.6  --   --   --  3.8  AST 27 30  --   --   --  21  ALT 20 18  --   --   --  15  ALKPHOS 50 41  --   --   --  52  BILITOT 0.5 0.5  --   --   --  0.5   < > = values in this interval not displayed.    Latest Reference Range & Units 11/10/23 10:35  Iron  28 - 170 ug/dL 50  UIBC ug/dL 709  TIBC 749 - 549 ug/dL 659  Saturation Ratios 10.4 - 31.8 % 15  Ferritin 11 - 307 ng/mL 71  Folate >5.9 ng/mL 11.4  Vitamin B12 180 - 914 pg/mL 394    Radiographic Studies: I have personally reviewed the radiological images as listed and agreed with the findings in the report.  None new to review  ASSESSMENT & PLAN:  Patient is an 83 year old female referred for late recent lymphadenectomy and consistent with high-grade serous carcinoma   High grade serous carcinoma (HCC) Patient with recent excisional biopsy of inguinal  lymph node consistent with high-grade serous carcinoma PET  scan with no other sites of disease Identified lesion just above ovarian serous adenocarcinoma 90% probability CEA, CA125: Normal, CA 19-9: Slightly elevated S/p hysterectomy in 1980, unknown if she had oophorectomy at that time Discussed with tumor board and recommendation was to refer to GYN ONC for surgical evaluation  - Will refer to GYN ONC for surgical evaluation  Return to clinic after GYN ONC evaluation  Ductal carcinoma in situ (DCIS) of right breast Patient has a history of DCIS of right breast diagnosed in 2022 s/p mastectomy.  ER/PR positive.   Did not receive endocrine therapy. Mammogram 10/2022 with no evidence of disease in the left breast   Normocytic anemia Patient has mild anemia with very mild iron  deficiency  - Start taking oral iron  every other day.  Use MiraLAX  for constipation  Vitamin B12 deficiency Patient has mild vitamin B12 deficiency with levels less than 400  - Continue oral vitamin B12 1000 mcg daily    No orders of the defined types were placed in this encounter.   The total time spent in the appointment was 30 minutes encounter with patients including review of chart and various tests results, discussions about plan of care and coordination of care plan   All questions were answered. The patient knows to call the clinic with any problems, questions or concerns. No barriers to learning was detected.  [name], MD 6/24/202511:33 AM

## 2023-11-26 ENCOUNTER — Telehealth: Payer: Self-pay

## 2023-11-26 NOTE — Telephone Encounter (Signed)
 Spoke with the patient regarding the referral to GYN oncology. Patient scheduled as new patient with Dr Eldonna on 11/30/2023. Patient given an arrival time of 10:00am.  Explained to the patient the the doctor will perform a pelvic exam at this visit. Patient given the policy that only one visitor allowed and that visitor must be over 16 yrs are allowed in the Cancer Center. Patient given the address/phone number for the clinic and that the center offers free valet service. Patient aware that masks required.

## 2023-11-27 ENCOUNTER — Encounter: Payer: Self-pay | Admitting: Psychiatry

## 2023-11-27 DIAGNOSIS — D509 Iron deficiency anemia, unspecified: Secondary | ICD-10-CM | POA: Diagnosis not present

## 2023-11-27 DIAGNOSIS — I5032 Chronic diastolic (congestive) heart failure: Secondary | ICD-10-CM | POA: Diagnosis not present

## 2023-11-27 DIAGNOSIS — I129 Hypertensive chronic kidney disease with stage 1 through stage 4 chronic kidney disease, or unspecified chronic kidney disease: Secondary | ICD-10-CM | POA: Diagnosis not present

## 2023-11-27 DIAGNOSIS — N1831 Chronic kidney disease, stage 3a: Secondary | ICD-10-CM | POA: Diagnosis not present

## 2023-11-30 ENCOUNTER — Inpatient Hospital Stay: Admitting: Psychiatry

## 2023-11-30 ENCOUNTER — Encounter: Payer: Self-pay | Admitting: Psychiatry

## 2023-11-30 VITALS — BP 146/72 | HR 74 | Temp 97.8°F | Resp 18 | Ht 61.0 in | Wt 138.8 lb

## 2023-11-30 DIAGNOSIS — C774 Secondary and unspecified malignant neoplasm of inguinal and lower limb lymph nodes: Secondary | ICD-10-CM | POA: Diagnosis not present

## 2023-11-30 DIAGNOSIS — C569 Malignant neoplasm of unspecified ovary: Secondary | ICD-10-CM

## 2023-11-30 NOTE — Patient Instructions (Signed)
 It was a pleasure to see you in clinic today. - Today we discussed that you likely have ovarian or fallopian tube cancer that has spread to the groin lymph node. - We discussed that standard treatment for ovarian or fallopian tube cancer is a combination of surgery and chemotherapy.  However, in the context of your medical conditions and goals and wishes we can consider alternative treatments that would more be for palliation of symptoms and maintaining quality of life. - If we did surgery this would entail laparoscopic surgery to remove the ovaries as well as an incision in the groin to remove the lymph node. - We could also consider instead of surgery or chemotherapy radiation to the groin which would be primarily to slow down the growth of this lymph node so that it does not cause many symptoms from growth. - We could also consider sending you to the palliative care team for discussion of symptom management if we opted for close observation. - We will have our team reach out to you later this week to follow-up and see what you are thinking in terms of the treatment discussion from above.  Thank you very much for allowing me to provide care for you today.  I appreciate your confidence in choosing our Gynecologic Oncology team at [facility].  If you have any questions about your visit today please call our office or send us  a MyChart message and we will get back to you as soon as possible.
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
## 2023-12-02 ENCOUNTER — Ambulatory Visit: Payer: PPO | Admitting: Neurology

## 2023-12-03 ENCOUNTER — Telehealth: Payer: Self-pay | Admitting: *Deleted

## 2023-12-03 NOTE — Telephone Encounter (Signed)
 Spoke with patient's daughter [name] to follow up and see what patient is thinking in terms of the treatment options discussed with Dr. Eldonna on 6/30.? Pt's daughter states her mother wants to do the surgery but would like for us  to call her mother to see if that is what she still would like to do. The office thanked Ms. Hathaway.  Spoke with patient [name] and relayed that the office spoke with her daughter about the treatment options that were discussed with Dr. Eldonna at her appt. This past Monday 6/30. Pt was initially confused about who was calling and then stated she thinks she wants the surgery but would like for us  to call her back on Monday. Pt states she has been having trouble with her bowels for the last 6 months. States, when I feel like I have to pass gas, then I pass stool and have to use the bathroom right away and is this ok before having surgery? Pt doesn't leave her house to often because of this. Advised patient she can discuss this with Dr. Eldonna how and it relates to her surgery or not. Pt also thought it was her kidney doctor's office calling.  Advised her message will be relayed to providers and the office will call back on Monday.

## 2023-12-07 ENCOUNTER — Telehealth: Payer: Self-pay | Admitting: *Deleted

## 2023-12-07 ENCOUNTER — Encounter: Payer: Self-pay | Admitting: Hematology

## 2023-12-07 NOTE — H&P (View-Only) (Signed)
 Gynecologic Oncology Return Clinic Visit  Date of Service: 12/08/2023 Referring Provider: [name], MD   Assessment & Plan: Nancy Liu is a 83 y.o. 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 Presents today for follow-up treatment discussion.  Recap from last visit.  Reviewed standard of care treatment for ovarian cancer to include a combination of surgery and chemotherapy.  Additionally reviewed alternative treatment options that are office standard of care but could be used to balance patient's goals and wishes.  Patient has concerns about her fecal incontinence and wishes for this to be better.  We reviewed that I do not think surgery will change this symptom.  But I do still think surgery is a reasonable option for this patient to help manage the current disease and potentially slow down spread/recurrence given that at this time surgery does not appear to need to be significantly radical.  Reviewed that surgery would entail a robotic assisted laparoscopic bilateral salpingo-oophorectomy (may have fallopian tubes and possibly 1 ovary removed at time of prior hysterectomy), and left inguinal lymph node removal.  Reviewed potential drain in left groin following surgery but will depend on radicality of surgery.  Patient was consented for: Robotic assisted bilateral salpingo-oophorectomy, mini laparotomy for omentectomy, left inguinal lymph node removal on 12/29/23.  The risks of surgery were discussed in detail and she understands these to including but not limited to bleeding requiring a blood transfusion, infection, injury to adjacent organs (including but not limited to the bowels, bladder, ureters, nerves, blood vessels), thromboembolic events, wound separation, hernia, unforseen complication, possible need for re-exploration, and medical complications such as heart attack, stroke, pneumonia.  If the patient  experiences any of these events, she understands that her hospitalization or recovery may be prolonged and that she may need to take additional medications for a prolonged period. The patient will receive DVT and antibiotic prophylaxis as indicated. She voiced a clear understanding. She had the opportunity to ask questions and informed consent was obtained today. She wishes to proceed.  Given her history of coronary stent, will seek cardiac clearance prior to surgery. All preoperative instructions were reviewed. Postoperative expectations were also reviewed. Written handouts were provided to the patient. Plan for postop observation overnight.   RTC postop.  Hoy Masters, MD Gynecologic Oncology   Medical Decision Making I personally spent  TOTAL 30 minutes face-to-face and non-face-to-face in the care of this patient, which includes all pre, intra, and post visit time on the date of service.   ----------------------- Reason for Visit: Treatment discussion  Treatment History: Oncology History  Ductal carcinoma in situ (DCIS) of right breast  01/01/2021 Mammogram   FINDINGS: Multiple circumscribed oval masses are noted in the MEDIAL portion of the RIGHT breast. Masses vary from 1-4 millimeters in diameter and appear contiguous, spanning 8.9 x 3.6 x 5.1 centimeters. There are faint calcifications spanning 4.2 x 1.9 x 3.5 centimeters in this same, segmental distribution.   01/14/2021 Cancer Staging   Staging form: Breast, AJCC 8th Edition - Clinical stage from 01/14/2021: Stage 0 (cTis (DCIS), cN0, cM0, G2, ER+, PR+, HER2: Not Assessed) - Signed by [name], MD on 01/21/2021 Stage prefix: Initial diagnosis Histologic grading system: 3 grade system   01/14/2021 Pathology Results   Diagnosis 1. Breast, right, needle core biopsy, anterior extent - DUCTAL CARCINOMA IN SITU, INTERMEDIATE GRADE WITH CALCIFICATIONS. SEE NOTE 2. Breast, right, needle core biopsy, posterior extent - DUCTAL  CARCINOMA  IN SITU, INTERMEDIATE GRADE WITH CALCIFICATIONS. SEE NOTE Diagnosis Note 1. and 2. DCIS measures 0.6 cm in part 1 and 0.4 cm in part 2.  1. PROGNOSTIC INDICATORS Results: IMMUNOHISTOCHEMICAL AND MORPHOMETRIC ANALYSIS PERFORMED MANUALLY Estrogen Receptor: >95%, POSITIVE, STRONG STAINING INTENSITY Progesterone Receptor: >95%, POSITIVE, STRONG STAINING INTENSITY     01/17/2021 Initial Diagnosis   Ductal carcinoma in situ (DCIS) of right breast   02/05/2021 Genetic Testing   Negative hereditary cancer genetic testing: no pathogenic variants detected in [facility] CustomNext-Cancer +RNAinsight Panel.  The report date is February 05, 2021.   The CustomNext-Cancer+RNAinsight panel offered by [employer] includes sequencing and rearrangement analysis for the following 47 genes:  APC, ATM, AXIN2, BARD1, BMPR1A, BRCA1, BRCA2, BRIP1, CDH1, CDK4, CDKN2A, CHEK2, DICER1, EPCAM, GREM1, HOXB13, MEN1, MLH1, MSH2, MSH3, MSH6, MUTYH, NBN, NF1, NF2, NTHL1, PALB2, PMS2, POLD1, POLE, PTEN, RAD51C, RAD51D, RECQL, RET, SDHA, SDHAF2, SDHB, SDHC, SDHD, SMAD4, SMARCA4, STK11, TP53, TSC1, TSC2, and VHL.  RNA data is routinely analyzed for use in variant interpretation for all genes.   02/20/2021 Cancer Staging   Staging form: Breast, AJCC 8th Edition - Pathologic stage from 02/20/2021: No Stage Recommended (pTis (DCIS), pNX, cM1, G2, ER+, PR+, HER2: Not Assessed) - Signed by [name], MD on 03/12/2021 Stage prefix: Initial diagnosis Histologic grading system: 3 grade system Residual tumor (R): R0 - None   02/20/2021 Definitive Surgery   FINAL MICROSCOPIC DIAGNOSIS:   A. BREAST, RIGHT, MASTECTOMY:  - Multifocal intermediate grade ductal carcinoma in situ with calcifications.  - Atypical lobular hyperplasia.  - Margins are negative for carcinoma.  - Biopsy site (x2).  - See oncology table.    High grade serous carcinoma (HCC)  10/01/2023 PET scan   IMPRESSION: 1. Hypermetabolic left inguinal lymph node,  maximum SUV 9.2. Given the high SUV, neoplastic involvement is a distinct possibility tissue sampling should be considered. 2. Multifocal accentuated activity in the colon is likely physiologic given the lack of correlate of CT findings, although technically nonspecific. 3. Right mastectomy.   10/21/2023 Pathology Results    FINAL MICROSCOPIC DIAGNOSIS:   A. LYMPH NODE, INGUINAL, BIOPSY:  - Metastatic poorly differentiated adenocarcinoma, see comment   Immunohistochemical stains show that the tumor cells are positive for  CK7 and PAX8 with patchy, weak labeling for CK5/6 and p16.  Immunostains for CK20, CDX2, GATA3 and p63 are negative.  This immunoprofile is most  consistent with a gynecologic primary while differential diagnosis can  include a primary renal cell carcinoma but is less likely.   Immunohistochemical stain for p53 shows a clonal loss of expression pattern, most consistent with metastatic high-grade serous carcinoma.    11/10/2023 Cancer Staging   Staging form: Ovary, AJCC 7th Edition - Clinical stage from 11/10/2023: Stage IIIC (TX, N1, M0) - Signed by Davonna Siad, MD on 11/13/2023 Stage prefix: Initial diagnosis Source of metastatic specimen: Inguinal Lymph Node Histologic grade (G): G4 Lymph-vascular invasion (LVI): Presence of LVI unknown/indeterminate   11/13/2023 Initial Diagnosis   High grade serous carcinoma (HCC)   11/19/2023 Pathology Results   Cancer type ID: Ovary, 90% probability Serous adenocarcinoma       Interval History: Presents today again with her sister.  Overall no changes.  Leaning towards proceeding with surgery.  Reports that she wants her bowel movements to be better; having fecal incontinence.    Past Medical/Surgical History: Past Medical History:  Diagnosis Date   Arteriosclerotic cardiovascular disease (ASCVD) 2001   Bare-metal stent placed in the right coronary  artery in 12/01; residual 50% lesion of the first diagonal and mid LAD    Breast cancer [facility])    Cerebrovascular disease    COPD (chronic obstructive pulmonary disease) (HCC)    Coronary artery disease    Cyst, dermoid, arm, left 03/26/2018   Diabetes mellitus    excellent control with a low-dose of a single oral agent   Family history of breast cancer 01/23/2021   Family history of ovarian cancer 01/23/2021   GERD (gastroesophageal reflux disease)    with ulcers   History of right coronary artery stent placement 2000   Hyperlipidemia    Hypertension    Sleep apnea    Thalassemia minor    Tobacco abuse, in remission    35 pack years; Quit in 1980    Past Surgical History:  Procedure Laterality Date   BIOPSY  02/28/2020   Procedure: BIOPSY;  Surgeon: [name], DO;  Location: [facility];  Service: Endoscopy;;   CARDIAC CATHETERIZATION     stent   COLONOSCOPY  2006   Dr. Kristie: normal   COLONOSCOPY  2000   Dr. Gardiner: normal    COLONOSCOPY N/A 03/03/2016   Dr. Harvey: diverticulosis, non-bleeding hemorrhoids, redundant left colon.   DILATION AND CURETTAGE OF UTERUS     ESOPHAGOGASTRODUODENOSCOPY  2000   Dr. Gardiner: gastritis    ESOPHAGOGASTRODUODENOSCOPY  2006   Dr. Kristie: small hiatal hernia, gastritis, negative H.pylori    ESOPHAGOGASTRODUODENOSCOPY N/A 03/03/2016   Procedure: ESOPHAGOGASTRODUODENOSCOPY (EGD);  Surgeon: Margo LITTIE Harvey, MD;  Location: [facility];  Service: Endoscopy;  Laterality: N/A;   ESOPHAGOGASTRODUODENOSCOPY N/A 06/03/2016   Dr. Harvey: nonaggressive gastritis due to aspirin  use. Previous ulcers had healed.   ESOPHAGOGASTRODUODENOSCOPY (EGD) WITH PROPOFOL  N/A 02/28/2020   focal inflammation in the gastric antrum consistent with gastritis on biopsies. No H.pylori.    EXCISION OF KELOID Left 03/26/2018   Procedure: EXCISION OF LEFT ARM MASS;  Surgeon: Gail Favorite, MD;  Location: [facility];  Service: General;  Laterality: Left;   GIVENS CAPSULE STUDY N/A 10/10/2020   Procedure: GIVENS CAPSULE  STUDY;  Surgeon: [name], DO;  Location: [facility];  Service: Endoscopy;  Laterality: N/A;  7:30am   INGUINAL HERNIA REPAIR  1970s   Right   INGUINAL LYMPH NODE BIOPSY Left 10/21/2023   Procedure: BIOPSY, LYMPH NODE, INGUINAL, OPEN;  Surgeon: Evonnie Dorothyann LABOR, DO;  Location: [facility];  Service: General;  Laterality: Left;   TOTAL MASTECTOMY Right 02/20/2021   Procedure: RIGHT TOTAL MASTECTOMY;  Surgeon: Ebbie Cough, MD;  Location: [facility];  Service: General;  Laterality: Right;   VAGINAL HYSTERECTOMY  1980   Unilateral oophorectomy    Family History  Problem Relation Age of Onset   Heart disease Mother        also hypertension and asthma   Lung cancer Mother        dx 57s   Cervical cancer Mother        dx before 12   Alzheimer's disease Maternal Grandmother    Diabetes Half-Sister    Breast cancer Half-Sister 19   Multiple myeloma Nephew 53       war-related exposures   Colon cancer Neg Hx    Colon polyps Neg Hx    Endometrial cancer Neg Hx    Pancreatic cancer Neg Hx    Prostate cancer Neg Hx    Ovarian cancer Neg Hx     Social History   Socioeconomic  History   Marital status: Divorced    Spouse name: Not on file   Number of children: 3   Years of education: 12   Highest education level: High school graduate  Occupational History   Occupation: Retired    Comment: Factory work  Tobacco Use   Smoking status: Former    Current packs/day: 0.00    Average packs/day: 1 pack/day for 18.0 years (18.0 ttl pk-yrs)    Types: Cigarettes    Start date: 06/02/1965    Quit date: 06/09/1980    Years since quitting: 43.5   Smokeless tobacco: Never  Vaping Use   Vaping status: Never Used  Substance and Sexual Activity   Alcohol use: No    Alcohol/week: 0.0 standard drinks of alcohol   Drug use: No   Sexual activity: Not Currently  Other Topics Concern   Not on file  Social History Narrative   Lives with daughter    Right handed    Social Drivers of  Health   Financial Resource Strain: Low Risk  (06/04/2021)   Overall Financial Resource Strain (CARDIA)    Difficulty of Paying Living Expenses: Not hard at all  Food Insecurity: No Food Insecurity (09/04/2023)   Hunger Vital Sign    Worried About Running Out of Food in the Last Year: Never true    Ran Out of Food in the Last Year: Never true  Transportation Needs: No Transportation Needs (09/04/2023)   PRAPARE - Administrator, Civil Service (Medical): No    Lack of Transportation (Non-Medical): No  Physical Activity: Inactive (06/04/2021)   Exercise Vital Sign    Days of Exercise per Week: 0 days    Minutes of Exercise per Session: 0 min  Stress: Stress Concern Present (06/04/2021)   [employer] of Occupational Health - Occupational Stress Questionnaire    Feeling of Stress : To some extent  Social Connections: Moderately Isolated (09/05/2023)   Social Connection and Isolation Panel    Frequency of Communication with Friends and Family: Three times a week    Frequency of Social Gatherings with Friends and Family: Three times a week    Attends Religious Services: 1 to 4 times per year    Active Member of Clubs or Organizations: No    Attends Banker Meetings: Never    Marital Status: Divorced    Current Medications:  Current Outpatient Medications:    amLODipine  (NORVASC ) 10 MG tablet, TAKE ONE TABLET BY MOUTH EVERYDAY AT BEDTIME (Patient taking differently: Take 10 mg by mouth daily.), Disp: 90 tablet, Rfl: 3   aspirin  EC 81 MG tablet, Take 1 tablet (81 mg total) by mouth every 4 (four) hours as needed for moderate pain (pain score 4-6) (Pain)., Disp: , Rfl:    carvedilol  (COREG ) 6.25 MG tablet, TAKE ONE TABLET BY MOUTH BEFORE BREAKFAST and TAKE ONE TABLET BY MOUTH EVERYDAY AT BEDTIME, Disp: 60 tablet, Rfl: 2   Cholecalciferol (VITAMIN D3) 50 MCG (2000 UT) TABS, TAKE ONE TABLET BY MOUTH daily in addition TO THE 1,000unit FOR total of 3,000units daily (Patient  taking differently: Take 2,000 Units by mouth 2 (two) times a week.), Disp: 30 tablet, Rfl: 1   cyanocobalamin  (VITAMIN B12) 1000 MCG tablet, Take 1 tablet (1,000 mcg total) by mouth daily., Disp: 90 tablet, Rfl: 2   docusate sodium  (COLACE) 100 MG capsule, Take 1 capsule (100 mg total) by mouth 2 (two) times daily., Disp: 60 capsule, Rfl: 2   donepezil  (ARICEPT )  5 MG tablet, Take 1 tablet (5 mg total) by mouth at bedtime., Disp: 30 tablet, Rfl: 3   feeding supplement (ENSURE ENLIVE / ENSURE PLUS) LIQD, Take 237 mLs by mouth 3 (three) times daily between meals. (Patient taking differently: Take 237 mLs by mouth 2 (two) times daily between meals.), Disp: 237 mL, Rfl: 12   ferrous sulfate  325 (65 FE) MG EC tablet, Take 1 tablet (325 mg total) by mouth every other day., Disp: 45 tablet, Rfl: 3   irbesartan -hydrochlorothiazide  (AVALIDE) 300-12.5 MG tablet, Take 1 tablet by mouth daily before breakfast., Disp: 90 tablet, Rfl: 3   metFORMIN  (GLUCOPHAGE ) 500 MG tablet, Take 1 tablet (500 mg total) by mouth at bedtime., Disp: 30 tablet, Rfl: 0   mirtazapine  (REMERON ) 7.5 MG tablet, Take 1 tablet (7.5 mg total) by mouth at bedtime. For depression and weight loss., Disp: 30 tablet, Rfl: 3   ONETOUCH ULTRA test strip, SMARTSIG:Strip(s), Disp: , Rfl:    oxyCODONE  (ROXICODONE ) 5 MG immediate release tablet, Take 1 tablet (5 mg total) by mouth every 6 (six) hours as needed., Disp: 4 tablet, Rfl: 0   rosuvastatin  (CRESTOR ) 20 MG tablet, TAKE ONE TABLET BY MOUTH EVERYDAY AT BEDTIME, Disp: 90 tablet, Rfl: 3   triamcinolone ointment (KENALOG) 0.5 %, Apply 1 application  topically 2 (two) times daily as needed (rash)., Disp: , Rfl:   Review of Symptoms: Complete 10-system review is positive for: Mild hearing loss, on and off leg swelling  Physical Exam: BP 130/72 (BP Location: Left Arm, Patient Position: Sitting)   Pulse 75   Temp 97.6 F (36.4 C) (Oral)   Resp 16   Ht 5' 1 (1.549 m)   Wt 136 lb 6.4 oz (61.9  kg)   SpO2 99%   BMI 25.77 kg/m  General: Alert, oriented, no acute distress. Exam from 11/30/23: Head and Neck: Normocephalic, atraumatic. Neck symmetric without masses. Sclera anicteric.  Respiratory: Normal work of breathing. Clear to auscultation bilaterally. Cardiovascular: Regular rate and rhythm, no murmurs, rubs, or gallops. Abdomen: Normoactive bowel sounds. Soft, non-distended, non-tender to palpation. No masses appreciated. Extremities: Grossly normal range of motion. Warm, well perfused. No edema bilaterally. Skin: Dark, plaque like skin changes on left shin and ankle. Lymphatic: No cervical, supraclavicular, adenopathy. Healing of left groin at biopsy site. Palpation of mildly enlarge left inguinal lymph node. Genitourinary: External genitalia without lesions. Urethral meatus without lesions or prolapse. On speculum exam, vagina without lesions. Bimanual exam reveals normal vaginal mucosa. No palpable adnexal mass. Exam chaperoned by Eleanor Epps, NP   Laboratory & Radiologic Studies: none

## 2023-12-07 NOTE — Progress Notes (Unsigned)
 Gynecologic Oncology Return Clinic Visit  Date of Service: 12/08/2023 Referring Provider: [name], MD   Assessment & Plan: Nancy Liu is a 83 y.o. 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 Presents today for follow-up treatment discussion.  Recap from last visit.  Reviewed standard of care treatment for ovarian cancer to include a combination of surgery and chemotherapy.  Additionally reviewed alternative treatment options that are office standard of care but could be used to balance patient's goals and wishes.  Patient has concerns about her fecal incontinence and wishes for this to be better.  We reviewed that I do not think surgery will change this symptom.  But I do still think surgery is a reasonable option for this patient to help manage the current disease and potentially slow down spread/recurrence given that at this time surgery does not appear to need to be significantly radical.  Reviewed that surgery would entail a robotic assisted laparoscopic bilateral salpingo-oophorectomy (may have fallopian tubes and possibly 1 ovary removed at time of prior hysterectomy), and left inguinal lymph node removal.  Reviewed potential drain in left groin following surgery but will depend on radicality of surgery.  Patient was consented for: Robotic assisted bilateral salpingo-oophorectomy, mini laparotomy for omentectomy, left inguinal lymph node removal on 12/29/23.  The risks of surgery were discussed in detail and she understands these to including but not limited to bleeding requiring a blood transfusion, infection, injury to adjacent organs (including but not limited to the bowels, bladder, ureters, nerves, blood vessels), thromboembolic events, wound separation, hernia, unforseen complication, possible need for re-exploration, and medical complications such as heart attack, stroke, pneumonia.  If the patient  experiences any of these events, she understands that her hospitalization or recovery may be prolonged and that she may need to take additional medications for a prolonged period. The patient will receive DVT and antibiotic prophylaxis as indicated. She voiced a clear understanding. She had the opportunity to ask questions and informed consent was obtained today. She wishes to proceed.  Given her history of coronary stent, will seek cardiac clearance prior to surgery. All preoperative instructions were reviewed. Postoperative expectations were also reviewed. Written handouts were provided to the patient. Plan for postop observation overnight.   RTC postop.  Hoy Masters, MD Gynecologic Oncology   Medical Decision Making I personally spent  TOTAL 30 minutes face-to-face and non-face-to-face in the care of this patient, which includes all pre, intra, and post visit time on the date of service.   ----------------------- Reason for Visit: Treatment discussion  Treatment History: Oncology History  Ductal carcinoma in situ (DCIS) of right breast  01/01/2021 Mammogram   FINDINGS: Multiple circumscribed oval masses are noted in the MEDIAL portion of the RIGHT breast. Masses vary from 1-4 millimeters in diameter and appear contiguous, spanning 8.9 x 3.6 x 5.1 centimeters. There are faint calcifications spanning 4.2 x 1.9 x 3.5 centimeters in this same, segmental distribution.   01/14/2021 Cancer Staging   Staging form: Breast, AJCC 8th Edition - Clinical stage from 01/14/2021: Stage 0 (cTis (DCIS), cN0, cM0, G2, ER+, PR+, HER2: Not Assessed) - Signed by [name], MD on 01/21/2021 Stage prefix: Initial diagnosis Histologic grading system: 3 grade system   01/14/2021 Pathology Results   Diagnosis 1. Breast, right, needle core biopsy, anterior extent - DUCTAL CARCINOMA IN SITU, INTERMEDIATE GRADE WITH CALCIFICATIONS. SEE NOTE 2. Breast, right, needle core biopsy, posterior extent - DUCTAL  CARCINOMA  IN SITU, INTERMEDIATE GRADE WITH CALCIFICATIONS. SEE NOTE Diagnosis Note 1. and 2. DCIS measures 0.6 cm in part 1 and 0.4 cm in part 2.  1. PROGNOSTIC INDICATORS Results: IMMUNOHISTOCHEMICAL AND MORPHOMETRIC ANALYSIS PERFORMED MANUALLY Estrogen Receptor: >95%, POSITIVE, STRONG STAINING INTENSITY Progesterone Receptor: >95%, POSITIVE, STRONG STAINING INTENSITY     01/17/2021 Initial Diagnosis   Ductal carcinoma in situ (DCIS) of right breast   02/05/2021 Genetic Testing   Negative hereditary cancer genetic testing: no pathogenic variants detected in [facility] CustomNext-Cancer +RNAinsight Panel.  The report date is February 05, 2021.   The CustomNext-Cancer+RNAinsight panel offered by [employer] includes sequencing and rearrangement analysis for the following 47 genes:  APC, ATM, AXIN2, BARD1, BMPR1A, BRCA1, BRCA2, BRIP1, CDH1, CDK4, CDKN2A, CHEK2, DICER1, EPCAM, GREM1, HOXB13, MEN1, MLH1, MSH2, MSH3, MSH6, MUTYH, NBN, NF1, NF2, NTHL1, PALB2, PMS2, POLD1, POLE, PTEN, RAD51C, RAD51D, RECQL, RET, SDHA, SDHAF2, SDHB, SDHC, SDHD, SMAD4, SMARCA4, STK11, TP53, TSC1, TSC2, and VHL.  RNA data is routinely analyzed for use in variant interpretation for all genes.   02/20/2021 Cancer Staging   Staging form: Breast, AJCC 8th Edition - Pathologic stage from 02/20/2021: No Stage Recommended (pTis (DCIS), pNX, cM1, G2, ER+, PR+, HER2: Not Assessed) - Signed by [name], MD on 03/12/2021 Stage prefix: Initial diagnosis Histologic grading system: 3 grade system Residual tumor (R): R0 - None   02/20/2021 Definitive Surgery   FINAL MICROSCOPIC DIAGNOSIS:   A. BREAST, RIGHT, MASTECTOMY:  - Multifocal intermediate grade ductal carcinoma in situ with calcifications.  - Atypical lobular hyperplasia.  - Margins are negative for carcinoma.  - Biopsy site (x2).  - See oncology table.    High grade serous carcinoma (HCC)  10/01/2023 PET scan   IMPRESSION: 1. Hypermetabolic left inguinal lymph node,  maximum SUV 9.2. Given the high SUV, neoplastic involvement is a distinct possibility tissue sampling should be considered. 2. Multifocal accentuated activity in the colon is likely physiologic given the lack of correlate of CT findings, although technically nonspecific. 3. Right mastectomy.   10/21/2023 Pathology Results    FINAL MICROSCOPIC DIAGNOSIS:   A. LYMPH NODE, INGUINAL, BIOPSY:  - Metastatic poorly differentiated adenocarcinoma, see comment   Immunohistochemical stains show that the tumor cells are positive for  CK7 and PAX8 with patchy, weak labeling for CK5/6 and p16.  Immunostains for CK20, CDX2, GATA3 and p63 are negative.  This immunoprofile is most  consistent with a gynecologic primary while differential diagnosis can  include a primary renal cell carcinoma but is less likely.   Immunohistochemical stain for p53 shows a clonal loss of expression pattern, most consistent with metastatic high-grade serous carcinoma.    11/10/2023 Cancer Staging   Staging form: Ovary, AJCC 7th Edition - Clinical stage from 11/10/2023: Stage IIIC (TX, N1, M0) - Signed by Davonna Siad, MD on 11/13/2023 Stage prefix: Initial diagnosis Source of metastatic specimen: Inguinal Lymph Node Histologic grade (G): G4 Lymph-vascular invasion (LVI): Presence of LVI unknown/indeterminate   11/13/2023 Initial Diagnosis   High grade serous carcinoma (HCC)   11/19/2023 Pathology Results   Cancer type ID: Ovary, 90% probability Serous adenocarcinoma       Interval History: Presents today again with her sister.  Overall no changes.  Leaning towards proceeding with surgery.  Reports that she wants her bowel movements to be better; having fecal incontinence.    Past Medical/Surgical History: Past Medical History:  Diagnosis Date   Arteriosclerotic cardiovascular disease (ASCVD) 2001   Bare-metal stent placed in the right coronary  artery in 12/01; residual 50% lesion of the first diagonal and mid LAD    Breast cancer [facility])    Cerebrovascular disease    COPD (chronic obstructive pulmonary disease) (HCC)    Coronary artery disease    Cyst, dermoid, arm, left 03/26/2018   Diabetes mellitus    excellent control with a low-dose of a single oral agent   Family history of breast cancer 01/23/2021   Family history of ovarian cancer 01/23/2021   GERD (gastroesophageal reflux disease)    with ulcers   History of right coronary artery stent placement 2000   Hyperlipidemia    Hypertension    Sleep apnea    Thalassemia minor    Tobacco abuse, in remission    35 pack years; Quit in 1980    Past Surgical History:  Procedure Laterality Date   BIOPSY  02/28/2020   Procedure: BIOPSY;  Surgeon: [name], DO;  Location: [facility];  Service: Endoscopy;;   CARDIAC CATHETERIZATION     stent   COLONOSCOPY  2006   Dr. Kristie: normal   COLONOSCOPY  2000   Dr. Gardiner: normal    COLONOSCOPY N/A 03/03/2016   Dr. Harvey: diverticulosis, non-bleeding hemorrhoids, redundant left colon.   DILATION AND CURETTAGE OF UTERUS     ESOPHAGOGASTRODUODENOSCOPY  2000   Dr. Gardiner: gastritis    ESOPHAGOGASTRODUODENOSCOPY  2006   Dr. Kristie: small hiatal hernia, gastritis, negative H.pylori    ESOPHAGOGASTRODUODENOSCOPY N/A 03/03/2016   Procedure: ESOPHAGOGASTRODUODENOSCOPY (EGD);  Surgeon: Margo LITTIE Harvey, MD;  Location: [facility];  Service: Endoscopy;  Laterality: N/A;   ESOPHAGOGASTRODUODENOSCOPY N/A 06/03/2016   Dr. Harvey: nonaggressive gastritis due to aspirin  use. Previous ulcers had healed.   ESOPHAGOGASTRODUODENOSCOPY (EGD) WITH PROPOFOL  N/A 02/28/2020   focal inflammation in the gastric antrum consistent with gastritis on biopsies. No H.pylori.    EXCISION OF KELOID Left 03/26/2018   Procedure: EXCISION OF LEFT ARM MASS;  Surgeon: Gail Favorite, MD;  Location: [facility];  Service: General;  Laterality: Left;   GIVENS CAPSULE STUDY N/A 10/10/2020   Procedure: GIVENS CAPSULE  STUDY;  Surgeon: [name], DO;  Location: [facility];  Service: Endoscopy;  Laterality: N/A;  7:30am   INGUINAL HERNIA REPAIR  1970s   Right   INGUINAL LYMPH NODE BIOPSY Left 10/21/2023   Procedure: BIOPSY, LYMPH NODE, INGUINAL, OPEN;  Surgeon: Evonnie Dorothyann LABOR, DO;  Location: [facility];  Service: General;  Laterality: Left;   TOTAL MASTECTOMY Right 02/20/2021   Procedure: RIGHT TOTAL MASTECTOMY;  Surgeon: Ebbie Cough, MD;  Location: [facility] OR;  Service: General;  Laterality: Right;   VAGINAL HYSTERECTOMY  1980   Unilateral oophorectomy    Family History  Problem Relation Age of Onset   Heart disease Mother        also hypertension and asthma   Lung cancer Mother        dx 57s   Cervical cancer Mother        dx before 12   Alzheimer's disease Maternal Grandmother    Diabetes Half-Sister    Breast cancer Half-Sister 19   Multiple myeloma Nephew 53       war-related exposures   Colon cancer Neg Hx    Colon polyps Neg Hx    Endometrial cancer Neg Hx    Pancreatic cancer Neg Hx    Prostate cancer Neg Hx    Ovarian cancer Neg Hx     Social History   Socioeconomic  History   Marital status: Divorced    Spouse name: Not on file   Number of children: 3   Years of education: 12   Highest education level: High school graduate  Occupational History   Occupation: Retired    Comment: Factory work  Tobacco Use   Smoking status: Former    Current packs/day: 0.00    Average packs/day: 1 pack/day for 18.0 years (18.0 ttl pk-yrs)    Types: Cigarettes    Start date: 06/02/1965    Quit date: 06/09/1980    Years since quitting: 43.5   Smokeless tobacco: Never  Vaping Use   Vaping status: Never Used  Substance and Sexual Activity   Alcohol use: No    Alcohol/week: 0.0 standard drinks of alcohol   Drug use: No   Sexual activity: Not Currently  Other Topics Concern   Not on file  Social History Narrative   Lives with daughter    Right handed    Social Drivers of  Health   Financial Resource Strain: Low Risk  (06/04/2021)   Overall Financial Resource Strain (CARDIA)    Difficulty of Paying Living Expenses: Not hard at all  Food Insecurity: No Food Insecurity (09/04/2023)   Hunger Vital Sign    Worried About Running Out of Food in the Last Year: Never true    Ran Out of Food in the Last Year: Never true  Transportation Needs: No Transportation Needs (09/04/2023)   PRAPARE - Administrator, Civil Service (Medical): No    Lack of Transportation (Non-Medical): No  Physical Activity: Inactive (06/04/2021)   Exercise Vital Sign    Days of Exercise per Week: 0 days    Minutes of Exercise per Session: 0 min  Stress: Stress Concern Present (06/04/2021)   [employer] of Occupational Health - Occupational Stress Questionnaire    Feeling of Stress : To some extent  Social Connections: Moderately Isolated (09/05/2023)   Social Connection and Isolation Panel    Frequency of Communication with Friends and Family: Three times a week    Frequency of Social Gatherings with Friends and Family: Three times a week    Attends Religious Services: 1 to 4 times per year    Active Member of Clubs or Organizations: No    Attends Banker Meetings: Never    Marital Status: Divorced    Current Medications:  Current Outpatient Medications:    amLODipine  (NORVASC ) 10 MG tablet, TAKE ONE TABLET BY MOUTH EVERYDAY AT BEDTIME (Patient taking differently: Take 10 mg by mouth daily.), Disp: 90 tablet, Rfl: 3   aspirin  EC 81 MG tablet, Take 1 tablet (81 mg total) by mouth every 4 (four) hours as needed for moderate pain (pain score 4-6) (Pain)., Disp: , Rfl:    carvedilol  (COREG ) 6.25 MG tablet, TAKE ONE TABLET BY MOUTH BEFORE BREAKFAST and TAKE ONE TABLET BY MOUTH EVERYDAY AT BEDTIME, Disp: 60 tablet, Rfl: 2   Cholecalciferol (VITAMIN D3) 50 MCG (2000 UT) TABS, TAKE ONE TABLET BY MOUTH daily in addition TO THE 1,000unit FOR total of 3,000units daily (Patient  taking differently: Take 2,000 Units by mouth 2 (two) times a week.), Disp: 30 tablet, Rfl: 1   cyanocobalamin  (VITAMIN B12) 1000 MCG tablet, Take 1 tablet (1,000 mcg total) by mouth daily., Disp: 90 tablet, Rfl: 2   docusate sodium  (COLACE) 100 MG capsule, Take 1 capsule (100 mg total) by mouth 2 (two) times daily., Disp: 60 capsule, Rfl: 2   donepezil  (ARICEPT )  5 MG tablet, Take 1 tablet (5 mg total) by mouth at bedtime., Disp: 30 tablet, Rfl: 3   feeding supplement (ENSURE ENLIVE / ENSURE PLUS) LIQD, Take 237 mLs by mouth 3 (three) times daily between meals. (Patient taking differently: Take 237 mLs by mouth 2 (two) times daily between meals.), Disp: 237 mL, Rfl: 12   ferrous sulfate  325 (65 FE) MG EC tablet, Take 1 tablet (325 mg total) by mouth every other day., Disp: 45 tablet, Rfl: 3   irbesartan -hydrochlorothiazide  (AVALIDE) 300-12.5 MG tablet, Take 1 tablet by mouth daily before breakfast., Disp: 90 tablet, Rfl: 3   metFORMIN  (GLUCOPHAGE ) 500 MG tablet, Take 1 tablet (500 mg total) by mouth at bedtime., Disp: 30 tablet, Rfl: 0   mirtazapine  (REMERON ) 7.5 MG tablet, Take 1 tablet (7.5 mg total) by mouth at bedtime. For depression and weight loss., Disp: 30 tablet, Rfl: 3   ONETOUCH ULTRA test strip, SMARTSIG:Strip(s), Disp: , Rfl:    oxyCODONE  (ROXICODONE ) 5 MG immediate release tablet, Take 1 tablet (5 mg total) by mouth every 6 (six) hours as needed., Disp: 4 tablet, Rfl: 0   rosuvastatin  (CRESTOR ) 20 MG tablet, TAKE ONE TABLET BY MOUTH EVERYDAY AT BEDTIME, Disp: 90 tablet, Rfl: 3   triamcinolone ointment (KENALOG) 0.5 %, Apply 1 application  topically 2 (two) times daily as needed (rash)., Disp: , Rfl:   Review of Symptoms: Complete 10-system review is positive for: Mild hearing loss, on and off leg swelling  Physical Exam: BP 130/72 (BP Location: Left Arm, Patient Position: Sitting)   Pulse 75   Temp 97.6 F (36.4 C) (Oral)   Resp 16   Ht 5' 1 (1.549 m)   Wt 136 lb 6.4 oz (61.9  kg)   SpO2 99%   BMI 25.77 kg/m  General: Alert, oriented, no acute distress. Exam from 11/30/23: Head and Neck: Normocephalic, atraumatic. Neck symmetric without masses. Sclera anicteric.  Respiratory: Normal work of breathing. Clear to auscultation bilaterally. Cardiovascular: Regular rate and rhythm, no murmurs, rubs, or gallops. Abdomen: Normoactive bowel sounds. Soft, non-distended, non-tender to palpation. No masses appreciated. Extremities: Grossly normal range of motion. Warm, well perfused. No edema bilaterally. Skin: Dark, plaque like skin changes on left shin and ankle. Lymphatic: No cervical, supraclavicular, adenopathy. Healing of left groin at biopsy site. Palpation of mildly enlarge left inguinal lymph node. Genitourinary: External genitalia without lesions. Urethral meatus without lesions or prolapse. On speculum exam, vagina without lesions. Bimanual exam reveals normal vaginal mucosa. No palpable adnexal mass. Exam chaperoned by Eleanor Epps, NP   Laboratory & Radiologic Studies: none

## 2023-12-07 NOTE — Telephone Encounter (Signed)
 Spoke with patient's daughter [name] and relayed message from Dr. Eldonna that she prefers to see Ms. Lawrance in person to discuss surgery with patient. [name] verbalized understanding and agreed to appt. Tomorrow 7/8 at 1130.

## 2023-12-07 NOTE — Telephone Encounter (Signed)
 Spoke with patient's daughter [name] this morning. [name] states she spoke with her mother over the weekend as suggested and Ms. Daughenbaugh is ready to move forward with surgery. She doesn't feel another visit with Dr. Eldonna is necessary, she discussed the surgery and Dr. Lyda treatments options with patient and pt wants to have surgery. Advised [name] her message would be relayed to providers. [name] thanked the office.

## 2023-12-08 ENCOUNTER — Encounter: Payer: Self-pay | Admitting: Psychiatry

## 2023-12-08 ENCOUNTER — Inpatient Hospital Stay: Admitting: Gynecologic Oncology

## 2023-12-08 ENCOUNTER — Telehealth: Payer: Self-pay

## 2023-12-08 ENCOUNTER — Inpatient Hospital Stay: Attending: Oncology | Admitting: Psychiatry

## 2023-12-08 VITALS — BP 130/72 | HR 75 | Temp 97.6°F | Resp 16 | Ht 61.0 in | Wt 136.4 lb

## 2023-12-08 DIAGNOSIS — C801 Malignant (primary) neoplasm, unspecified: Secondary | ICD-10-CM | POA: Insufficient documentation

## 2023-12-08 DIAGNOSIS — Z853 Personal history of malignant neoplasm of breast: Secondary | ICD-10-CM | POA: Diagnosis not present

## 2023-12-08 DIAGNOSIS — C774 Secondary and unspecified malignant neoplasm of inguinal and lower limb lymph nodes: Secondary | ICD-10-CM | POA: Insufficient documentation

## 2023-12-08 DIAGNOSIS — Z170 Estrogen receptor positive status [ER+]: Secondary | ICD-10-CM | POA: Insufficient documentation

## 2023-12-08 DIAGNOSIS — C569 Malignant neoplasm of unspecified ovary: Secondary | ICD-10-CM

## 2023-12-08 DIAGNOSIS — R159 Full incontinence of feces: Secondary | ICD-10-CM | POA: Insufficient documentation

## 2023-12-08 DIAGNOSIS — Z7189 Other specified counseling: Secondary | ICD-10-CM | POA: Diagnosis not present

## 2023-12-08 DIAGNOSIS — D509 Iron deficiency anemia, unspecified: Secondary | ICD-10-CM | POA: Insufficient documentation

## 2023-12-08 DIAGNOSIS — Z9011 Acquired absence of right breast and nipple: Secondary | ICD-10-CM | POA: Insufficient documentation

## 2023-12-08 DIAGNOSIS — Z955 Presence of coronary angioplasty implant and graft: Secondary | ICD-10-CM | POA: Diagnosis not present

## 2023-12-08 MED ORDER — SENNOSIDES-DOCUSATE SODIUM 8.6-50 MG PO TABS: 2.0000 | ORAL_TABLET | Freq: Every day | ORAL | 0 refills | Status: DC

## 2023-12-08 MED ORDER — OXYCODONE HCL 5 MG PO TABS: 5.0000 mg | ORAL_TABLET | Freq: Four times a day (QID) | ORAL | 0 refills | Status: DC | PRN

## 2023-12-08 NOTE — Telephone Encounter (Signed)
   Name: Nancy Liu  DOB: [birth year]  MRN: [id number]  Primary Cardiologist: Alvan Carrier, MD  Chart reviewed as part of pre-operative protocol coverage. The patient has an upcoming visit scheduled with Lum Louis, NP on 12/23/2023 at which time clearance can be addressed in case there are any issues that would impact surgical recommendations.  Robotic assisted bilateral salpingo-oophorectomy, mini laparotomy for omentectomy, left inguinal lymph node removal is not scheduled until 12/29/2023 as below. I added preop FYI to appointment note so that provider is aware to address at time of outpatient visit.  Per office protocol the cardiology provider should forward their finalized clearance decision and recommendations regarding antiplatelet therapy to the requesting party below.    I will route this message as FYI to requesting party and remove this message from the preop box as separate preop APP input not needed at this time.   Please call with any questions.  Damien JAYSON Braver, NP  12/08/2023, 3:59 PM

## 2023-12-08 NOTE — Progress Notes (Signed)
 Patient here for an appointment with Dr. Eldonna and for a pre-operative appointment prior to her scheduled surgery on 01/29/2024. She is scheduled for a robotic assisted bilateral salpingo-oophorectomy, mini laparotomy for omentectomy, left inguinofemoral lymph node removal with drain placement.  The surgery was discussed in detail.  See after visit summary for additional details.      Discussed post-op pain management in detail including the aspects of the enhanced recovery pathway.  Advised her that a new prescription would be sent in for Oxycodone  and it is only to be used for after her upcoming surgery.  We discussed the use of tylenol  post-op and to monitor for a maximum of 4,000 mg in a 24 hour period.  Also prescribed sennakot to be used after surgery and to hold if having loose stools.  Discussed bowel regimen in detail.     Discussed the JP drain and how to empty and record the drainage.  Advised that she will be provided with a form to record the drainage.  Also showed her how to charge the drain.   Discussed the use of SCDs and measures to take at home to prevent DVT including frequent mobility.  Reportable signs and symptoms of DVT discussed. Post-operative instructions discussed and expectations for after surgery. Incisional care discussed as well including reportable signs and symptoms including erythema, drainage, wound separation.     30 minutes spent with the patient.  Verbalizing understanding of material discussed. No needs or concerns voiced at the end of the visit.   Advised patient to call for any needs.  Advised that her post-operative medications had been prescribed and could be picked up at any time.    This appointment is included in the global surgical bundle as pre-operative teaching and has no charge.

## 2023-12-08 NOTE — Patient Instructions (Signed)
 Preparing for your Surgery  Plan for surgery on December 29, 2023 with Dr. Hoy Masters at [facility]. You will be scheduled for robotic assisted bilateral salpingo-oophorectomy (removal of both ovaries and fallopian tubes), mini laparotomy for omentectomy (slightly larger incision for omentum removal), left inguinofemoral lymph node removal (removal of the lymph nodes in the left groin region) with drain placement.  Plan to stay overnight in the hospital after surgery. If doing well the next morning, we will get you home.   Pre-operative Testing -You will receive a phone call from presurgical testing at [facility] to arrange for a pre-operative appointment and lab work.  -Bring your insurance card, copy of an advanced directive if applicable, medication list  -At that visit, you will be asked to sign a consent for a possible blood transfusion in case a transfusion becomes necessary during surgery.  The need for a blood transfusion is rare but having consent is a necessary part of your care.     -YOU CAN CONTINUE TAKING YOUR BABY ASPIRIN  (81 MG) DAILY WITH THE LAST DOSE BEING THE DAY BEFORE SURGERY IN THE AM. DO NOT TAKE THE MORNING OF SURGERY.  -Do not take supplements such as fish oil (omega 3), red yeast rice, turmeric before your surgery. STOP TAKING AT LEAST 10 DAYS BEFORE SURGERY. You want to avoid medications with aspirin  in them including headache powders such as BC or Goody's), Excedrin migraine.  Day Before Surgery at Home -You will be asked to take in a light diet the day before surgery. You will be advised you can have clear liquids up until 3 hours before your surgery.    Eat a light diet the day before surgery.  Examples including soups, broths, toast, yogurt, mashed potatoes.  AVOID GAS PRODUCING FOODS AND BEVERAGES. Things to avoid include carbonated beverages (fizzy beverages, sodas), raw fruits and raw vegetables (uncooked), or beans.   If your bowels are  filled with gas, your surgeon will have difficulty visualizing your pelvic organs which increases your surgical risks.  Your role in recovery Your role is to become active as soon as directed by your doctor, while still giving yourself time to heal.  Rest when you feel tired. You will be asked to do the following in order to speed your recovery:  - Cough and breathe deeply. This helps to clear and expand your lungs and can prevent pneumonia after surgery.  - STAY ACTIVE WHEN YOU GET HOME. Do mild physical activity. Walking or moving your legs help your circulation and body functions return to normal. Do not try to get up or walk alone the first time after surgery.   -If you develop swelling on one leg or the other, pain in the back of your leg, redness/warmth in one of your legs, please call the office or go to the Emergency Room to have a doppler to rule out a blood clot. For shortness of breath, chest pain-seek care in the Emergency Room as soon as possible. - Actively manage your pain. Managing your pain lets you move in comfort. We will ask you to rate your pain on a scale of zero to 10. It is your responsibility to tell your doctor or nurse where and how much you hurt so your pain can be treated.  Special Considerations -If you are diabetic, you may be placed on insulin  after surgery to have closer control over your blood sugars to promote healing and recovery.  This does not mean that  you will be discharged on insulin .  If applicable, your oral antidiabetics will be resumed when you are tolerating a solid diet.  -Your final pathology results from surgery should be available around one week after surgery and the results will be relayed to you when available.  -Dr. Olam Mill is the surgeon that assists your GYN Oncologist with surgery.  If you end up staying the night, the next day after your surgery you will either see Dr. Viktoria, Dr. Eldonna, or Dr. Olam Mill.  -FMLA forms can  be faxed to [phone] and please allow 5-7 business days for completion.  Pain Management After Surgery -You will be prescribed your pain medication and bowel regimen medications before surgery so that you can have these available when you are discharged from the hospital. The pain medication is for use ONLY AFTER surgery and a new prescription will not be given.   -Make sure that you have Tylenol  IF YOU ARE ABLE TO TAKE THESE MEDICATION at home to use on a regular basis after surgery for pain control.   -Review the attached handout on narcotic use and their risks and side effects.   Bowel Regimen -You will be prescribed Sennakot-S to take nightly to prevent constipation especially if you are taking the narcotic pain medication intermittently.  It is important to prevent constipation and drink adequate amounts of liquids. You can stop taking this medication when you are not taking pain medication and you are back on your normal bowel routine.  Risks of Surgery Risks of surgery are low but include bleeding, infection, damage to surrounding structures, re-operation, blood clots, and very rarely death.   Blood Transfusion Information (For the consent to be signed before surgery)  We will be checking your blood type before surgery so in case of emergencies, we will know what type of blood you would need.                                            WHAT IS A BLOOD TRANSFUSION?  A transfusion is the replacement of blood or some of its parts. Blood is made up of multiple cells which provide different functions. Red blood cells carry oxygen  and are used for blood loss replacement. White blood cells fight against infection. Platelets control bleeding. Plasma helps clot blood. Other blood products are available for specialized needs, such as hemophilia or other clotting disorders. BEFORE THE TRANSFUSION  Who gives blood for transfusions?  You may be able to donate blood to be used at a later  date on yourself (autologous donation). Relatives can be asked to donate blood. This is generally not any safer than if you have received blood from a stranger. The same precautions are taken to ensure safety when a relative's blood is donated. Healthy volunteers who are fully evaluated to make sure their blood is safe. This is blood bank blood. Transfusion therapy is the safest it has ever been in the practice of medicine. Before blood is taken from a donor, a complete history is taken to make sure that person has no history of diseases nor engages in risky social behavior (examples are intravenous drug use or sexual activity with multiple partners). The donor's travel history is screened to minimize risk of transmitting infections, such as malaria. The donated blood is tested for signs of infectious diseases, such as HIV and hepatitis. The blood is  then tested to be sure it is compatible with you in order to minimize the chance of a transfusion reaction. If you or a relative donates blood, this is often done in anticipation of surgery and is not appropriate for emergency situations. It takes many days to process the donated blood. RISKS AND COMPLICATIONS Although transfusion therapy is very safe and saves many lives, the main dangers of transfusion include:  Getting an infectious disease. Developing a transfusion reaction. This is an allergic reaction to something in the blood you were given. Every precaution is taken to prevent this. The decision to have a blood transfusion has been considered carefully by your caregiver before blood is given. Blood is not given unless the benefits outweigh the risks.  AFTER SURGERY INSTRUCTIONS  Return to work: 4-6 weeks if applicable  Activity: 1. Be up and out of the bed during the day.  Take a nap if needed.  You may walk up steps but be careful and use the hand rail.  Stair climbing will tire you more than you think, you may need to stop part way and rest.    2. No lifting or straining for 6 weeks over 10 pounds. No pushing, pulling, straining for 6 weeks.  3. No driving for 4-89 days when the following criteria have been met: Do not drive if you are taking narcotic pain medicine and make sure that your reaction time has returned.   4. You can shower as soon as the next day after surgery. Shower daily.  Use your regular soap and water  (not directly on the incision) and pat your incision(s) dry afterwards; don't rub.  No tub baths or submerging your body in water  until cleared by your surgeon. If you have the soap that was given to you by pre-surgical testing that was used before surgery, you do not need to use it afterwards because this can irritate your incisions.   5. No sexual activity and nothing in the vagina for 6 weeks.  6. You may experience a small amount of clear drainage from your incisions, which is normal.  If the drainage persists, increases, or changes color please call the office.  7. Do not use creams, lotions, or ointments such as neosporin on your incisions after surgery until advised by your surgeon because they can cause removal of the dermabond glue on your incisions.    8. Take Tylenol  first for pain if you are able to take these medication and only use narcotic pain medication for severe pain not relieved by the Tylenol .  Monitor your Tylenol  intake to a max of 4,000 mg in a 24 hour period.   Diet: 1. Low sodium Heart Healthy Diet is recommended but you are cleared to resume your normal (before surgery) diet after your procedure.  2. It is safe to use a laxative, such as Miralax  or Colace, if you have difficulty moving your bowels before surgery. You have been prescribed Sennakot-S to take at bedtime every evening after surgery to keep bowel movements regular and to prevent constipation.    Wound Care: 1. Keep clean and dry.  Shower daily.  Reasons to call the Doctor: Fever - Oral temperature greater than 100.4 degrees  Fahrenheit Foul-smelling vaginal discharge Difficulty urinating Nausea and vomiting Increased pain at the site of the incision that is unrelieved with pain medicine. Difficulty breathing with or without chest pain New calf pain especially if only on one side Sudden, continuing increased vaginal bleeding with or without clots.  Contacts: For questions or concerns you should contact:  Dr. Hoy Masters at [phone]  Eleanor Epps, NP at [phone]  After Hours: call [phone] and have the GYN Oncologist paged/contacted (after 5 pm or on the weekends). You will speak with an after hours RN and let he or she know you have had surgery.  Messages sent via mychart are for non-urgent matters and are not responded to after hours so for urgent needs, please call the after hours number.

## 2023-12-08 NOTE — Telephone Encounter (Signed)
   Pre-operative Risk Assessment    Patient Name: Nancy Liu  DOB: 06-20-1940 MRN: [id number]   Date of last office visit: 04/17/23 Date of next office visit: 12/23/23   Request for Surgical Clearance    Procedure:  Robotic assisted bilateral salpingo-oophorectomy, mini laparotomy for omentectomy, left inguinal lymph node removal   Date of Surgery:  Clearance 12/29/23                                 Surgeon:  Dr. Hoy Masters   Surgeon's Group or Practice Name:  [facility]  Phone number:  [phone] Fax number:  [phone]   Type of Clearance Requested:   - Medical  - Pharmacy:  Hold Aspirin  Not indicated    Type of Anesthesia:  General    Additional requests/questions:    SignedRebeca Blight   12/08/2023, 2:50 PM

## 2023-12-09 ENCOUNTER — Inpatient Hospital Stay: Admitting: Licensed Clinical Social Worker

## 2023-12-09 NOTE — Progress Notes (Signed)
 [facility] Clinical Social Work  Clinical Social Work was referred by new patient protocol for assessment for psychosocial needs.  Clinical Social Worker contacted caregiver by phone (daughter- [name]) to offer support and assess for needs.     Interventions: Pt's daughter interviewed and assessments performed: SDOH Provided pt's daughter information about home care agencies  Short assessment as pt's daughter was occupied and said she will call back as needed. Biggest concern currently is for in-home aide to assist pt post-surgery when family cannot be present. Currently, daughter moved in to help but is working. There are other family members who assist as well.     Follow Up Plan:  Family/Patient will contact CSW with any support or resource needs. Pt/family will contact home care agencies for additional in home support    Iliyana Convey E Andrew Soria, LCSW  Clinical Social Worker [facility]

## 2023-12-10 ENCOUNTER — Telehealth: Payer: Self-pay | Admitting: *Deleted

## 2023-12-10 ENCOUNTER — Telehealth: Payer: Self-pay | Admitting: Licensed Clinical Social Worker

## 2023-12-10 NOTE — Telephone Encounter (Signed)
 [facility] Clinical Social Work  CSW received return call from pt's daughter, [name]. [name] asked more about a nurse/ home health for 1-2 weeks post-surgery to assist more with wound care/ bandage changes and if this is possible for them to have. CSW had previously sent information on in-home aide agencies for non-skilled care based on description of needs yuesterday (food/eating, remembering medications).  CSW informed [name] that the current request would need to be an order from the provider for a skilled [facility] RN if it is needed.   CSW sent message to the medical team to determine necessity and, if needed, for an order to be placed for [facility] post-surgery.   Weber Monnier E Loral Campi, LCSW

## 2023-12-10 NOTE — Telephone Encounter (Signed)
 Spoke with patient's daughter [name] and relayed message from Eleanor Epps, NP that there should be minimal wound care after her mother's surgery. Pt's surgery will be robotic (laparoscopic) with a slightly larger incision made on the abdomen but this should not require dressing changes. There will be a liquid suture (dermabound) on the abdominal incisions that dissolve and flake off in 2-3 weeks. She will have an incision in the groin and a JP drain coming from this. This will require once daily dry dressing changes which should be simple and emptying the drain several times a day. We will provide a cylinder type container to measure the drainage output and a flow sheet record to document the amounts onto. And as the drainage becomes less and less the JP drain will be removed in the office. Instructions will be given in the hospital prior to discharge and the office will call with follow up. [name] verbalized understanding and is aware they can call the office with any concerns or questions before or after Ms. Yott's surgery. [name] thanked the office for calling.

## 2023-12-11 ENCOUNTER — Encounter: Payer: Self-pay | Admitting: Hematology

## 2023-12-16 ENCOUNTER — Inpatient Hospital Stay: Admitting: Oncology

## 2023-12-16 VITALS — BP 129/84 | HR 79 | Temp 97.7°F | Resp 18 | Wt 137.0 lb

## 2023-12-16 DIAGNOSIS — D0511 Intraductal carcinoma in situ of right breast: Secondary | ICD-10-CM | POA: Diagnosis not present

## 2023-12-16 DIAGNOSIS — C801 Malignant (primary) neoplasm, unspecified: Secondary | ICD-10-CM | POA: Diagnosis not present

## 2023-12-16 DIAGNOSIS — C569 Malignant neoplasm of unspecified ovary: Secondary | ICD-10-CM

## 2023-12-16 DIAGNOSIS — D509 Iron deficiency anemia, unspecified: Secondary | ICD-10-CM | POA: Diagnosis not present

## 2023-12-16 NOTE — Progress Notes (Signed)
 Patient Care Team: Benjamine Aland, MD as PCP - General (Family Medicine) Alvan, Dorn FALCON, MD as PCP - Cardiology (Cardiology) [name], MD as Consulting Physician (Hematology) Ebbie Cough, MD as Consulting Physician (General Surgery) [name], MD as Consulting Physician (Radiation Oncology) [name], MD (Inactive) as Consulting Physician (Pulmonary Disease) [name], DO as Consulting Physician (Gastroenterology) [name], MD as Consulting Physician (Pulmonary Disease) [name], DO as Consulting Physician (Neurology)  Clinic Day:  12/16/2023  Referring physician: Benjamine Aland, MD   CHIEF COMPLAINT:  CC: Metastatic high-grade serous ovarian carcinoma   ASSESSMENT & PLAN:   Assessment & Plan: Nancy Liu  is a 83 y.o. female with metastatic high-grade serous ovarian carcinoma  Assessment & Plan High grade serous carcinoma [facility]) Patient with recent excisional biopsy of inguinal lymph node consistent with high-grade serous carcinoma PET scan with no other sites of disease Identified lesion just above ovarian serous adenocarcinoma 90% probability CEA, CA125: Normal, CA 19-9: Slightly elevated S/p hysterectomy in 1980, unknown if she had oophorectomy at that time Discussed with tumor board and recommendation was to refer to GYN ONC for surgical evaluation Seen by Dr. Eldonna on 12/08/2023 and is planned to get robotic assisted bilateral salpingo-oophorectomy, mini laparotomy for omentectomy, left inguinal lymph node removal  - Discussed that patient might require a few cycles of chemotherapy based on how she is after surgery.  Return to clinic 3 weeks after surgery for assessment and to discuss further management Ductal carcinoma in situ (DCIS) of right breast Patient has a history of DCIS of right breast diagnosed in 2022 s/p mastectomy.  ER/PR positive.   Did not receive endocrine therapy. Mammogram 10/2022 with no evidence of disease in  the left breast  Microcytic anemia Labs consistent with mild microcytic anemia secondary to iron  deficiency  - Continue oral iron  pills every other day  Will repeat labs in 3 months    The patient understands the plans discussed today and is in agreement with them.  She knows to contact our office if she develops concerns prior to her next appointment.  I provided 20 minutes of face-to-face time during this encounter and > 50% was spent counseling as documented under my assessment and plan.    [name], MD  [facility] [place] - A DEPT OF [facility] [address] [place] [address] Dept: [phone] Dept Fax: [phone]   No orders of the defined types were placed in this encounter.    ONCOLOGY HISTORY:   Oncology History  Ductal carcinoma in situ (DCIS) of right breast  01/01/2021 Mammogram   FINDINGS: Multiple circumscribed oval masses are noted in the MEDIAL portion of the RIGHT breast. Masses vary from 1-4 millimeters in diameter and appear contiguous, spanning 8.9 x 3.6 x 5.1 centimeters. There are faint calcifications spanning 4.2 x 1.9 x 3.5 centimeters in this same, segmental distribution.   01/14/2021 Cancer Staging   Staging form: Breast, AJCC 8th Edition - Clinical stage from 01/14/2021: Stage 0 (cTis (DCIS), cN0, cM0, G2, ER+, PR+, HER2: Not Assessed) - Signed by [name], MD on 01/21/2021 Stage prefix: Initial diagnosis Histologic grading system: 3 grade system   01/14/2021 Pathology Results   Diagnosis 1. Breast, right, needle core biopsy, anterior extent - DUCTAL CARCINOMA IN SITU, INTERMEDIATE GRADE WITH CALCIFICATIONS. SEE NOTE 2. Breast, right, needle core biopsy, posterior extent - DUCTAL CARCINOMA IN SITU, INTERMEDIATE GRADE WITH CALCIFICATIONS. SEE  NOTE Diagnosis Note 1. and 2. DCIS measures 0.6 cm in part 1 and 0.4 cm in part 2.  1. PROGNOSTIC  INDICATORS Results: IMMUNOHISTOCHEMICAL AND MORPHOMETRIC ANALYSIS PERFORMED MANUALLY Estrogen Receptor: >95%, POSITIVE, STRONG STAINING INTENSITY Progesterone Receptor: >95%, POSITIVE, STRONG STAINING INTENSITY     01/17/2021 Initial Diagnosis   Ductal carcinoma in situ (DCIS) of right breast   02/05/2021 Genetic Testing   Negative hereditary cancer genetic testing: no pathogenic variants detected in [employer] CustomNext-Cancer +RNAinsight Panel.  The report date is February 05, 2021.   The CustomNext-Cancer+RNAinsight panel offered by [employer] includes sequencing and rearrangement analysis for the following 47 genes:  APC, ATM, AXIN2, BARD1, BMPR1A, BRCA1, BRCA2, BRIP1, CDH1, CDK4, CDKN2A, CHEK2, DICER1, EPCAM, GREM1, HOXB13, MEN1, MLH1, MSH2, MSH3, MSH6, MUTYH, NBN, NF1, NF2, NTHL1, PALB2, PMS2, POLD1, POLE, PTEN, RAD51C, RAD51D, RECQL, RET, SDHA, SDHAF2, SDHB, SDHC, SDHD, SMAD4, SMARCA4, STK11, TP53, TSC1, TSC2, and VHL.  RNA data is routinely analyzed for use in variant interpretation for all genes.   02/20/2021 Cancer Staging   Staging form: Breast, AJCC 8th Edition - Pathologic stage from 02/20/2021: No Stage Recommended (pTis (DCIS), pNX, cM1, G2, ER+, PR+, HER2: Not Assessed) - Signed by [name], MD on 03/12/2021 Stage prefix: Initial diagnosis Histologic grading system: 3 grade system Residual tumor (R): R0 - None   02/20/2021 Definitive Surgery   FINAL MICROSCOPIC DIAGNOSIS:   A. BREAST, RIGHT, MASTECTOMY:  - Multifocal intermediate grade ductal carcinoma in situ with calcifications.  - Atypical lobular hyperplasia.  - Margins are negative for carcinoma.  - Biopsy site (x2).  - See oncology table.    High grade serous carcinoma (HCC)  10/01/2023 PET scan   IMPRESSION: 1. Hypermetabolic left inguinal lymph node, maximum SUV 9.2. Given the high SUV, neoplastic involvement is a distinct possibility tissue sampling should be considered. 2. Multifocal accentuated activity in the  colon is likely physiologic given the lack of correlate of CT findings, although technically nonspecific. 3. Right mastectomy.   10/21/2023 Pathology Results    FINAL MICROSCOPIC DIAGNOSIS:   A. LYMPH NODE, INGUINAL, BIOPSY:  - Metastatic poorly differentiated adenocarcinoma, see comment   Immunohistochemical stains show that the tumor cells are positive for  CK7 and PAX8 with patchy, weak labeling for CK5/6 and p16.  Immunostains for CK20, CDX2, GATA3 and p63 are negative.  This immunoprofile is most  consistent with a gynecologic primary while differential diagnosis can  include a primary renal cell carcinoma but is less likely.   Immunohistochemical stain for p53 shows a clonal loss of expression pattern, most consistent with metastatic high-grade serous carcinoma.    11/10/2023 Cancer Staging   Staging form: Ovary, AJCC 7th Edition - Clinical stage from 11/10/2023: Stage IIIC (TX, N1, M0) - Signed by Davonna Siad, MD on 11/13/2023 Stage prefix: Initial diagnosis Source of metastatic specimen: Inguinal Lymph Node Histologic grade (G): G4 Lymph-vascular invasion (LVI): Presence of LVI unknown/indeterminate   11/13/2023 Initial Diagnosis   High grade serous carcinoma (HCC)   11/19/2023 Pathology Results   Cancer type ID: Ovary, 90% probability Serous adenocarcinoma         Current Treatment: Surgery  INTERVAL HISTORY:   MARIYANNA MUCHA is here today for follow up. Patient is accompanied by her sister today.. She denies fevers or chills. She denies pain. Her appetite is good. Her weight has been stable.  Patient has no complaints today.  She is doing well.  I have reviewed the past medical history, past surgical history, social  history and family history with the patient and they are unchanged from previous note.  ALLERGIES:  is allergic to protonix  [pantoprazole  sodium].  MEDICATIONS:  Current Outpatient Medications  Medication Sig Dispense Refill   amLODipine   (NORVASC ) 10 MG tablet TAKE ONE TABLET BY MOUTH EVERYDAY AT BEDTIME (Patient taking differently: Take 10 mg by mouth daily.) 90 tablet 3   aspirin  EC 81 MG tablet Take 1 tablet (81 mg total) by mouth every 4 (four) hours as needed for moderate pain (pain score 4-6) (Pain).     carvedilol  (COREG ) 6.25 MG tablet TAKE ONE TABLET BY MOUTH BEFORE BREAKFAST and TAKE ONE TABLET BY MOUTH EVERYDAY AT BEDTIME 60 tablet 2   Cholecalciferol (VITAMIN D3) 50 MCG (2000 UT) TABS TAKE ONE TABLET BY MOUTH daily in addition TO THE 1,000unit FOR total of 3,000units daily (Patient taking differently: Take 2,000 Units by mouth 2 (two) times a week.) 30 tablet 1   cyanocobalamin  (VITAMIN B12) 1000 MCG tablet Take 1 tablet (1,000 mcg total) by mouth daily. 90 tablet 2   docusate sodium  (COLACE) 100 MG capsule Take 1 capsule (100 mg total) by mouth 2 (two) times daily. 60 capsule 2   donepezil  (ARICEPT ) 5 MG tablet Take 1 tablet (5 mg total) by mouth at bedtime. 30 tablet 3   feeding supplement (ENSURE ENLIVE / ENSURE PLUS) LIQD Take 237 mLs by mouth 3 (three) times daily between meals. (Patient taking differently: Take 237 mLs by mouth 2 (two) times daily between meals.) 237 mL 12   ferrous sulfate  325 (65 FE) MG EC tablet Take 1 tablet (325 mg total) by mouth every other day. 45 tablet 3   irbesartan -hydrochlorothiazide  (AVALIDE) 300-12.5 MG tablet Take 1 tablet by mouth daily before breakfast. 90 tablet 3   metFORMIN  (GLUCOPHAGE ) 500 MG tablet Take 1 tablet (500 mg total) by mouth at bedtime. 30 tablet 0   mirtazapine  (REMERON ) 7.5 MG tablet Take 1 tablet (7.5 mg total) by mouth at bedtime. For depression and weight loss. 30 tablet 3   ONETOUCH ULTRA test strip SMARTSIG:Strip(s)     oxyCODONE  (ROXICODONE ) 5 MG immediate release tablet Take 1 tablet (5 mg total) by mouth every 6 (six) hours as needed for severe pain (pain score 7-10). For AFTER surgery only, do not take and drive 10 tablet 0   rosuvastatin  (CRESTOR ) 20 MG  tablet TAKE ONE TABLET BY MOUTH EVERYDAY AT BEDTIME 90 tablet 3   senna-docusate (SENOKOT-S) 8.6-50 MG tablet Take 2 tablets by mouth at bedtime. For AFTER surgery, do not take if having diarrhea 30 tablet 0   triamcinolone ointment (KENALOG) 0.5 % Apply 1 application  topically 2 (two) times daily as needed (rash).     No current facility-administered medications for this visit.    REVIEW OF SYSTEMS:   Constitutional: Denies fevers, chills or abnormal weight loss Eyes: Denies blurriness of vision Ears, nose, mouth, throat, and face: Denies mucositis or sore throat Respiratory: Denies cough, dyspnea or wheezes Cardiovascular: Denies palpitation, chest discomfort or lower extremity swelling Gastrointestinal:  Denies nausea, heartburn or change in bowel habits Skin: Denies abnormal skin rashes Lymphatics: Denies new lymphadenopathy or easy bruising Neurological:Denies numbness, tingling or new weaknesses Behavioral/Psych: Mood is stable, no new changes  All other systems were reviewed with the patient and are negative.   VITALS:  Blood pressure 129/84, pulse 79, temperature 97.7 F (36.5 C), temperature source Oral, resp. rate 18, weight 137 lb (62.1 kg), SpO2 100%.  Wt Readings from Last 3  Encounters:  12/16/23 137 lb (62.1 kg)  12/08/23 136 lb 6.4 oz (61.9 kg)  11/30/23 138 lb 12.8 oz (63 kg)    Body mass index is 25.89 kg/m.  Performance status (ECOG): 2 - Symptomatic, <50% confined to bed  PHYSICAL EXAM:   GENERAL:alert, no distress and comfortable SKIN: skin color, texture, turgor are normal, no rashes or significant lesions LYMPH:  no palpable lymphadenopathy in the cervical, axillary or inguinal LUNGS: clear to auscultation and percussion with normal breathing effort HEART: regular rate & rhythm and no murmurs and no lower extremity edema ABDOMEN:abdomen soft, non-tender and normal bowel sounds Musculoskeletal: Left lower leg skin discoloration with some fluid  discharge.  No signs of infection.  Picture attached below NEURO: alert & oriented x 3 with fluent speech   LABORATORY DATA:  I have reviewed the data as listed    Component Value Date/Time   NA 139 11/10/2023 1035   NA 141 07/03/2022 1411   K 3.7 11/10/2023 1035   CL 102 11/10/2023 1035   CO2 26 11/10/2023 1035   GLUCOSE 92 11/10/2023 1035   BUN 19 11/10/2023 1035   BUN 20 07/03/2022 1411   CREATININE 1.03 (H) 11/10/2023 1035   CREATININE 1.22 (H) 01/23/2021 0745   CALCIUM  9.4 11/10/2023 1035   PROT 6.9 11/10/2023 1035   ALBUMIN  3.8 11/10/2023 1035   AST 21 11/10/2023 1035   AST 24 01/23/2021 0745   ALT 15 11/10/2023 1035   ALT 17 01/23/2021 0745   ALKPHOS 52 11/10/2023 1035   BILITOT 0.5 11/10/2023 1035   BILITOT 0.6 01/23/2021 0745   GFRNONAA 55 (L) 11/10/2023 1035   GFRNONAA 45 (L) 01/23/2021 0745   GFRAA 53 (L) 12/13/2019 1020    Lab Results  Component Value Date   WBC 6.9 11/10/2023   NEUTROABS 5.4 11/10/2023   HGB 11.6 (L) 11/10/2023   HCT 36.7 11/10/2023   MCV 80.5 11/10/2023   PLT 294 11/10/2023      Chemistry      Component Value Date/Time   NA 139 11/10/2023 1035   NA 141 07/03/2022 1411   K 3.7 11/10/2023 1035   CL 102 11/10/2023 1035   CO2 26 11/10/2023 1035   BUN 19 11/10/2023 1035   BUN 20 07/03/2022 1411   CREATININE 1.03 (H) 11/10/2023 1035   CREATININE 1.22 (H) 01/23/2021 0745      Component Value Date/Time   CALCIUM  9.4 11/10/2023 1035   ALKPHOS 52 11/10/2023 1035   AST 21 11/10/2023 1035   AST 24 01/23/2021 0745   ALT 15 11/10/2023 1035   ALT 17 01/23/2021 0745   BILITOT 0.5 11/10/2023 1035   BILITOT 0.6 01/23/2021 0745       RADIOGRAPHIC STUDIES: I have personally reviewed the radiological images as listed and agreed with the findings in the report  None new to review

## 2023-12-16 NOTE — Assessment & Plan Note (Addendum)
 Labs consistent with mild microcytic anemia secondary to iron  deficiency  - Continue oral iron  pills every other day  Will repeat labs in 3 months

## 2023-12-16 NOTE — Assessment & Plan Note (Addendum)
 Patient has a history of DCIS of right breast diagnosed in 2022 s/p mastectomy.  ER/PR positive.   Did not receive endocrine therapy. Mammogram 10/2022 with no evidence of disease in the left breast

## 2023-12-16 NOTE — Assessment & Plan Note (Addendum)
 Patient with recent excisional biopsy of inguinal lymph node consistent with high-grade serous carcinoma PET scan with no other sites of disease Identified lesion just above ovarian serous adenocarcinoma 90% probability CEA, CA125: Normal, CA 19-9: Slightly elevated S/p hysterectomy in 1980, unknown if she had oophorectomy at that time Discussed with tumor board and recommendation was to refer to GYN ONC for surgical evaluation Seen by Dr. Eldonna on 12/08/2023 and is planned to get robotic assisted bilateral salpingo-oophorectomy, mini laparotomy for omentectomy, left inguinal lymph node removal  - Discussed that patient might require a few cycles of chemotherapy based on how she is after surgery.  Return to clinic 3 weeks after surgery for assessment and to discuss further management

## 2023-12-22 ENCOUNTER — Encounter: Payer: Self-pay | Admitting: *Deleted

## 2023-12-22 NOTE — Patient Instructions (Signed)
 SURGICAL WAITING ROOM VISITATION  Patients having surgery or a procedure may have no more than 2 support people in the waiting area - these visitors may rotate.    Children under the age of 74 must have an adult with them who is not the patient.  Visitors with respiratory illnesses are discouraged from visiting and should remain at home.  If the patient needs to stay at the hospital during part of their recovery, the visitor guidelines for inpatient rooms apply. Pre-op nurse will coordinate an appropriate time for 1 support person to accompany patient in pre-op.  This support person may not rotate.    Please refer to the [facility] website for the visitor guidelines for Inpatients (after your surgery is over and you are in a regular room).       Your procedure is scheduled on: 12-29-23   Report to [facility] Main Entrance    Report to admitting at         11:00  AM   Call this number if you have problems the morning of surgery [phone]  Eat a light diet the day before surgery.  Examples including soups, broths, toast, yogurt, mashed potatoes.  Things to avoid include carbonated beverages (fizzy beverages), raw fruits and raw vegetables, or beans.   If your bowels are filled with gas, your surgeon will have difficulty visualizing your pelvic organs which increases your surgical risks.    Do not eat food :After Midnight.   After Midnight you may have the following liquids until _    1030 _____ AM/  DAY OF SURGERY  then nothing by mouth  Water  Non-Citrus Juices (without pulp, NO RED-Apple, White grape, White cranberry) Black Coffee (NO MILK/CREAM OR CREAMERS, sugar ok)  Clear Tea (NO MILK/CREAM OR CREAMERS, sugar ok) regular and decaf                             Plain Jell-O (NO RED)                                           Fruit ices (not with fruit pulp, NO RED)                                     Popsicles (NO RED)                                                                Sports drinks like Gatorade (NO RED)                            If you have questions, please contact your surgeon's office.   FOLLOW  ANY ADDITIONAL PRE OP INSTRUCTIONS YOU RECEIVED FROM YOUR SURGEON'S OFFICE!!!     Oral Hygiene is also important to reduce your risk of infection.  Remember - BRUSH YOUR TEETH THE MORNING OF SURGERY WITH YOUR REGULAR TOOTHPASTE  DENTURES WILL BE REMOVED PRIOR TO SURGERY PLEASE DO NOT APPLY Poly grip OR ADHESIVES!!!   Do NOT smoke after Midnight   Stop all vitamins and herbal supplements 7 days before surgery.   Take these medicines the morning of surgery with A SIP OF WATER : Carvedilol , amlodipine   DO NOT TAKE ANY ORAL DIABETIC MEDICATIONS DAY OF YOUR SURGERY  Bring CPAP mask and tubing day of surgery.                              You may not have any metal on your body including hair pins, jewelry, and body piercing             Do not wear make-up, lotions, powders, perfumes/cologne, or deodorant  Do not wear nail polish including gel and S&S, artificial/acrylic nails, or any other type of covering on natural nails including finger and toenails. If you have artificial nails, gel coating, etc. that needs to be removed by a nail salon please have this removed prior to surgery or surgery may need to be canceled/ delayed if the surgeon/ anesthesia feels like they are unable to be safely monitored.   Do not shave  48 hours prior to surgery.         Do not bring valuables to the hospital. [place] IS NOT             RESPONSIBLE   FOR VALUABLES.   Contacts, glasses, dentures or bridgework may not be worn into surgery.   Bring small overnight bag day of surgery.   DO NOT BRING YOUR HOME MEDICATIONS TO THE HOSPITAL. PHARMACY WILL DISPENSE MEDICATIONS LISTED ON YOUR MEDICATION LIST TO YOU DURING YOUR ADMISSION IN THE HOSPITAL!    Patients discharged on the day of surgery will not be allowed to  drive home.  Someone NEEDS to stay with you for the first 24 hours after anesthesia.   Special Instructions: Bring a copy of your healthcare power of attorney and living will documents the day of surgery if you haven't scanned them before.              Please read over the following fact sheets you were given: IF YOU HAVE QUESTIONS ABOUT YOUR PRE-OP INSTRUCTIONS PLEASE CALL [phone].   . If you test positive for Covid or have been in contact with anyone that has tested positive in the last 10 days please notify you surgeon.     - Preparing for Surgery Before surgery, you can play an important role.  Because skin is not sterile, your skin needs to be as free of germs as possible.  You can reduce the number of germs on your skin by washing with CHG (chlorahexidine gluconate) soap before surgery.  CHG is an antiseptic cleaner which kills germs and bonds with the skin to continue killing germs even after washing. Please DO NOT use if you have an allergy to CHG or antibacterial soaps.  If your skin becomes reddened/irritated stop using the CHG and inform your nurse when you arrive at Short Stay. Do not shave (including legs and underarms) for at least 48 hours prior to the first CHG shower.  You may shave your face/neck. Please follow these instructions carefully:  1.  Shower with CHG Soap the night before surgery and the  morning of Surgery.  2.  If you  choose to wash your hair, wash your hair first as usual with your  normal  shampoo.  3.  After you shampoo, rinse your hair and body thoroughly to remove the  shampoo.                            4.  Use CHG as you would any other liquid soap.  You can apply chg directly  to the skin and wash                       Gently with a scrungie or clean washcloth.  5.  Apply the CHG Soap to your body ONLY FROM THE NECK DOWN.   Do not use on face/ open                           Wound or open sores. Avoid contact with eyes, ears mouth and genitals  (private parts).                       Wash face,  Genitals (private parts) with your normal soap.             6.  Wash thoroughly, paying special attention to the area where your surgery  will be performed.  7.  Thoroughly rinse your body with warm water  from the neck down.  8.  DO NOT shower/wash with your normal soap after using and rinsing off  the CHG Soap.                9.  Pat yourself dry with a clean towel.            10.  Wear clean pajamas.            11.  Place clean sheets on your bed the night of your first shower and do not  sleep with pets. Day of Surgery : Do not apply any lotions/deodorants the morning of surgery.  Please wear clean clothes to the hospital/surgery center.  FAILURE TO FOLLOW THESE INSTRUCTIONS MAY RESULT IN THE CANCELLATION OF YOUR SURGERY PATIENT SIGNATURE_________________________________  NURSE SIGNATURE__________________________________  ________________________________________________________________________ WHAT IS A BLOOD TRANSFUSION? Blood Transfusion Information  A transfusion is the replacement of blood or some of its parts. Blood is made up of multiple cells which provide different functions. Red blood cells carry oxygen  and are used for blood loss replacement. White blood cells fight against infection. Platelets control bleeding. Plasma helps clot blood. Other blood products are available for specialized needs, such as hemophilia or other clotting disorders. BEFORE THE TRANSFUSION  Who gives blood for transfusions?  Healthy volunteers who are fully evaluated to make sure their blood is safe. This is blood bank blood. Transfusion therapy is the safest it has ever been in the practice of medicine. Before blood is taken from a donor, a complete history is taken to make sure that person has no history of diseases nor engages in risky social behavior (examples are intravenous drug use or sexual activity with multiple partners). The donor's travel  history is screened to minimize risk of transmitting infections, such as malaria. The donated blood is tested for signs of infectious diseases, such as HIV and hepatitis. The blood is then tested to be sure it is compatible with you in order to minimize the chance of a transfusion reaction. If you or a relative donates blood,  this is often done in anticipation of surgery and is not appropriate for emergency situations. It takes many days to process the donated blood. RISKS AND COMPLICATIONS Although transfusion therapy is very safe and saves many lives, the main dangers of transfusion include:  Getting an infectious disease. Developing a transfusion reaction. This is an allergic reaction to something in the blood you were given. Every precaution is taken to prevent this. The decision to have a blood transfusion has been considered carefully by your caregiver before blood is given. Blood is not given unless the benefits outweigh the risks. AFTER THE TRANSFUSION Right after receiving a blood transfusion, you will usually feel much better and more energetic. This is especially true if your red blood cells have gotten low (anemic). The transfusion raises the level of the red blood cells which carry oxygen , and this usually causes an energy increase. The nurse administering the transfusion will monitor you carefully for complications. HOME CARE INSTRUCTIONS  No special instructions are needed after a transfusion. You may find your energy is better. Speak with your caregiver about any limitations on activity for underlying diseases you may have. SEEK MEDICAL CARE IF:  Your condition is not improving after your transfusion. You develop redness or irritation at the intravenous (IV) site. SEEK IMMEDIATE MEDICAL CARE IF:  Any of the following symptoms occur over the next 12 hours: Shaking chills. You have a temperature by mouth above 102 F (38.9 C), not controlled by medicine. Chest, back, or muscle  pain. People around you feel you are not acting correctly or are confused. Shortness of breath or difficulty breathing. Dizziness and fainting. You get a rash or develop hives. You have a decrease in urine output. Your urine turns a dark color or changes to pink, red, or brown. Any of the following symptoms occur over the next 10 days: You have a temperature by mouth above 102 F (38.9 C), not controlled by medicine. Shortness of breath. Weakness after normal activity. The white part of the eye turns yellow (jaundice). You have a decrease in the amount of urine or are urinating less often. Your urine turns a dark color or changes to pink, red, or brown. Document Released: 05/16/2000 Document Revised: 08/11/2011 Document Reviewed: 01/03/2008 [facility] Patient Information 2014 [place], [place].  _______________________________________________________________________

## 2023-12-22 NOTE — Progress Notes (Addendum)
 PCP - Kennieth Leech, MD  Cardiologist - Dorn Ross, MD  ARNETTA 04-17-23 epic/ Scheduled visit  12-23-23 with Lum Rana PIETY for clearance  Pulm- Ozell America, MD  ARNETTA 07-28-23 epic  PPM/ICD -  Device Orders -  Rep Notified -   Chest x-ray -  EKG - 10-19-23 epic Stress Test - 04-24-23 epic ECHO - 10-08-21 epic Cardiac Cath -  MN PET- 10-01-23 epic  Sleep Study -  CPAP -   Fasting Blood Sugar -  Checks Blood Sugar _____ times a day  Blood Thinner Instructions: Aspirin  Instructions:81 mg asa  ERAS Protcol - PRE-SURGERY Ensure or G2-    COVID vaccine -  Activity-- Anesthesia review:   Patient denies shortness of breath, fever, cough and chest pain at PAT appointment   All instructions explained to the patient, with a verbal understanding of the material. Patient agrees to go over the instructions while at home for a better understanding. Patient also instructed to self quarantine after being tested for COVID-19. The opportunity to ask questions was provided.

## 2023-12-23 ENCOUNTER — Encounter: Payer: Self-pay | Admitting: Emergency Medicine

## 2023-12-23 ENCOUNTER — Other Ambulatory Visit: Payer: Self-pay

## 2023-12-23 ENCOUNTER — Encounter (HOSPITAL_COMMUNITY)
Admission: RE | Admit: 2023-12-23 | Discharge: 2023-12-23 | Disposition: A | Discharge status: Home / Self Care | Source: Ambulatory Visit | Attending: Psychiatry | Admitting: Psychiatry

## 2023-12-23 ENCOUNTER — Ambulatory Visit: Attending: Cardiovascular Disease | Admitting: Emergency Medicine

## 2023-12-23 ENCOUNTER — Encounter (HOSPITAL_COMMUNITY): Payer: Self-pay

## 2023-12-23 VITALS — BP 125/69 | HR 76 | Ht 61.0 in | Wt 132.2 lb

## 2023-12-23 VITALS — BP 132/74 | HR 66 | Temp 98.7°F | Resp 16 | Ht 61.0 in | Wt 131.0 lb

## 2023-12-23 DIAGNOSIS — E119 Type 2 diabetes mellitus without complications: Secondary | ICD-10-CM | POA: Insufficient documentation

## 2023-12-23 DIAGNOSIS — Z87891 Personal history of nicotine dependence: Secondary | ICD-10-CM | POA: Diagnosis not present

## 2023-12-23 DIAGNOSIS — E782 Mixed hyperlipidemia: Secondary | ICD-10-CM

## 2023-12-23 DIAGNOSIS — I129 Hypertensive chronic kidney disease with stage 1 through stage 4 chronic kidney disease, or unspecified chronic kidney disease: Secondary | ICD-10-CM | POA: Insufficient documentation

## 2023-12-23 DIAGNOSIS — C763 Malignant neoplasm of pelvis: Secondary | ICD-10-CM | POA: Diagnosis not present

## 2023-12-23 DIAGNOSIS — Z9011 Acquired absence of right breast and nipple: Secondary | ICD-10-CM | POA: Insufficient documentation

## 2023-12-23 DIAGNOSIS — Z853 Personal history of malignant neoplasm of breast: Secondary | ICD-10-CM | POA: Insufficient documentation

## 2023-12-23 DIAGNOSIS — Z7984 Long term (current) use of oral hypoglycemic drugs: Secondary | ICD-10-CM | POA: Insufficient documentation

## 2023-12-23 DIAGNOSIS — I4510 Unspecified right bundle-branch block: Secondary | ICD-10-CM | POA: Insufficient documentation

## 2023-12-23 DIAGNOSIS — M4316 Spondylolisthesis, lumbar region: Secondary | ICD-10-CM | POA: Insufficient documentation

## 2023-12-23 DIAGNOSIS — I2510 Atherosclerotic heart disease of native coronary artery without angina pectoris: Secondary | ICD-10-CM

## 2023-12-23 DIAGNOSIS — Z01810 Encounter for preprocedural cardiovascular examination: Secondary | ICD-10-CM | POA: Diagnosis not present

## 2023-12-23 DIAGNOSIS — J449 Chronic obstructive pulmonary disease, unspecified: Secondary | ICD-10-CM | POA: Diagnosis not present

## 2023-12-23 DIAGNOSIS — N189 Chronic kidney disease, unspecified: Secondary | ICD-10-CM | POA: Diagnosis not present

## 2023-12-23 DIAGNOSIS — I10 Essential (primary) hypertension: Secondary | ICD-10-CM | POA: Diagnosis not present

## 2023-12-23 DIAGNOSIS — G4733 Obstructive sleep apnea (adult) (pediatric): Secondary | ICD-10-CM | POA: Diagnosis not present

## 2023-12-23 DIAGNOSIS — Z01812 Encounter for preprocedural laboratory examination: Secondary | ICD-10-CM | POA: Diagnosis not present

## 2023-12-23 DIAGNOSIS — I700 Atherosclerosis of aorta: Secondary | ICD-10-CM | POA: Diagnosis not present

## 2023-12-23 DIAGNOSIS — Z955 Presence of coronary angioplasty implant and graft: Secondary | ICD-10-CM | POA: Insufficient documentation

## 2023-12-23 DIAGNOSIS — C569 Malignant neoplasm of unspecified ovary: Secondary | ICD-10-CM

## 2023-12-23 HISTORY — DX: Depression, unspecified: F32.A

## 2023-12-23 HISTORY — DX: Unspecified osteoarthritis, unspecified site: M19.90

## 2023-12-23 HISTORY — DX: Chronic kidney disease, unspecified: N18.9

## 2023-12-23 HISTORY — DX: Anxiety disorder, unspecified: F41.9

## 2023-12-23 LAB — CBC
HCT: 38.9 % (ref 36.0–46.0)
Hemoglobin: 11.8 g/dL — ABNORMAL LOW (ref 12.0–15.0)
MCH: 23.8 pg — ABNORMAL LOW (ref 26.0–34.0)
MCHC: 30.3 g/dL (ref 30.0–36.0)
MCV: 78.6 fL — ABNORMAL LOW (ref 80.0–100.0)
Platelets: 268 K/uL (ref 150–400)
RBC: 4.95 MIL/uL (ref 3.87–5.11)
RDW: 14.9 % (ref 11.5–15.5)
WBC: 9.2 K/uL (ref 4.0–10.5)
nRBC: 0 % (ref 0.0–0.2)

## 2023-12-23 LAB — COMPREHENSIVE METABOLIC PANEL WITH GFR
ALT: 13 U/L (ref 0–44)
AST: 26 U/L (ref 15–41)
Albumin: 4.2 g/dL (ref 3.5–5.0)
Alkaline Phosphatase: 60 U/L (ref 38–126)
Anion gap: 9 (ref 5–15)
BUN: 24 mg/dL — ABNORMAL HIGH (ref 8–23)
CO2: 27 mmol/L (ref 22–32)
Calcium: 9.6 mg/dL (ref 8.9–10.3)
Chloride: 101 mmol/L (ref 98–111)
Creatinine, Ser: 1.14 mg/dL — ABNORMAL HIGH (ref 0.44–1.00)
GFR, Estimated: 48 mL/min — ABNORMAL LOW (ref >60)
Glucose, Bld: 105 mg/dL — ABNORMAL HIGH (ref 70–99)
Potassium: 4.1 mmol/L (ref 3.5–5.1)
Sodium: 137 mmol/L (ref 135–145)
Total Bilirubin: 0.7 mg/dL (ref 0.0–1.2)
Total Protein: 7.3 g/dL (ref 6.5–8.1)

## 2023-12-23 LAB — HEMOGLOBIN A1C
Hgb A1c MFr Bld: 5.4 % (ref 4.8–5.6)
Mean Plasma Glucose: 108.28 mg/dL

## 2023-12-23 LAB — GLUCOSE, CAPILLARY: Glucose-Capillary: 106 mg/dL — ABNORMAL HIGH (ref 70–99)

## 2023-12-23 NOTE — Patient Instructions (Signed)
 Medication Instructions:  NO CHANGES   Lab Work: NONE   Testing/Procedures: NONE  Follow-Up: At [facility], you and your health needs are our priority.  As part of our continuing mission to provide you with exceptional heart care, our providers are all part of one team.  This team includes your primary Cardiologist (physician) and Advanced Practice Providers or APPs (Physician Assistants and Nurse Practitioners) who all work together to provide you with the care you need, when you need it.  Your next appointment:   6 MONTHS  Provider:   Alvan Carrier, MD    We recommend signing up for the patient portal called MyChart.  Sign up information is provided on this After Visit Summary.  MyChart is used to connect with patients for Virtual Visits (Telemedicine).  Patients are able to view lab/test results, encounter notes, upcoming appointments, etc.  Non-urgent messages can be sent to your provider as well.   To learn more about what you can do with MyChart, go to [email or web address].

## 2023-12-23 NOTE — Progress Notes (Addendum)
 Cardiology Office Note:    Date:  12/23/2023  ID:  Nancy Liu, DOB 1940-09-20, MRN [id number] PCP: Benjamine Aland, MD  [facility] Providers Cardiologist:  Alvan Carrier, MD Cardiology APP:  [name], NP       Patient Profile:       Chief Complaint: Preoperative cardiovascular exam History of Present Illness:  Nancy Liu is a 83 y.o. female with visit-pertinent history of coronary artery disease, hypertension, hyperlipidemia, former smoker, anemia, aortic valve sclerosis  She has history of coronary artery disease with prior BMS to RCA in 2001.  MPI on 02/2011 showed no ischemia.  Echocardiogram 04/2013 showed LVEF of 60 to 65% with grade 1 diastolic dysfunction, nuclear stress test on 03/2020 showed no ischemia.  Echocardiogram 09/2021 with LVEF 60 to 65%, grade 1 DD.  Patient was last seen in clinic 04/17/2023.  She presented with recurrent chest pains.  Exercise Myoview  was ordered and completed on 04/2023 showing normal exercise capacity with no angina during the test.  Stress EKG negative for ischemia and arrhythmias.  There was no evidence of ischemia or infarction.  The study was low risk.  She is now pending robotic assisted bilateral salpingo-- oophorectomy, mini laparotomy for omentectomy, left inguinal lymph node removal on 12/29/2023 with [facility] gynecology oncology with Dr. Eldonna.   Discussed the use of AI scribe software for clinical note transcription with the patient, who gave verbal consent to proceed.  History of Present Illness Nancy Liu is an 83 year old female who presents for pre-operative clearance for an upcoming procedure for malignant ovarian neoplasm.  Today patient presents with daughter.  Patient is doing well without acute cardiovascular concerns or complaints.  She denies any chest pains and mentions her chest pain that she had previously has entirely resolved.  She denies any exertional symptoms.  She  does have chronic dyspnea on exertion that is stable and unchanged.  She experiences shortness of breath, particularly during physical activities such as walking or exercising.  She tells me she is only taking her aspirin  as needed and unsure when this changed.  She remains physically active by walking indoors and performing daily activities independently.  She is able to complete 4 METS without difficulty.  She denies any orthopnea, PND, leg swelling, syncope, presyncope, palpitations  Review of systems:  Please see the history of present illness. All other systems are reviewed and otherwise negative.      Studies Reviewed:    EKG Interpretation Date/Time:  Wednesday December 23 2023 14:00:47 EDT Ventricular Rate:  80 PR Interval:  128 QRS Duration:  116 QT Interval:  390 QTC Calculation: 449 R Axis:   84  Text Interpretation: Normal sinus rhythm Incomplete right bundle branch block When compared with ECG of 19-Oct-2023 13:29, No significant change was found Confirmed by Rana Lum [phone]) on 12/23/2023 2:45:40 PM    Exercise Myoview  04/24/2023   Patient exercised for 5 minutes and 16 seconds, per Bruce protocol, achieving 7.00 METS. Normal exercise capacity. Elevated BP response. No angina during the test. Test was stopped due to dyspnea. Stress ECG is negative for ischemia and arrhythmias.   LV perfusion is normal. There is no evidence of ischemia. There is no evidence of infarction.   Left ventricular function is normal. Nuclear stress EF: 87%.   Findings are consistent with no ischemia and no infarction. The study is low risk.  Echocardiogram 10/08/2021 1. Left ventricular ejection fraction, by estimation, is 60 to  65%. The  left ventricle has normal function. The left ventricle has no regional  wall motion abn at that time is next following a lifetime with ormalities. Left ventricular diastolic parameters are  consistent with Grade I diastolic  dysfunction (impaired  relaxation). The average left ventricular global  longitudinal strain is -18.1 %. The global longitudinal strain is normal.   2. Right ventricular systolic function is normal. The right ventricular  size is normal. There is normal pulmonary artery systolic pressure. The  estimated right ventricular systolic pressure is 17.6 mmHg.   3. The mitral valve is grossly normal. Trivial mitral valve  regurgitation.   4. The aortic valve was not well visualized. Aortic valve regurgitation  is not visualized. Aortic valve sclerosis/calcification is present,  without any evidence of aortic stenosis. Aortic valve mean gradient  measures 4.0 mmHg.   5. The inferior vena cava is normal in size with greater than 50%  respiratory variability, suggesting right atrial pressure of 3 mmHg.   Lexiscan  Myoview  03/30/2020 There was no ST segment deviation noted during stress. The study is normal. There are no perfusion defects consistent with prior infarct or current ischemia. This is a low risk study. The left ventricular ejection fraction is hyperdynamic (>65%).  Risk Assessment/Calculations:              Physical Exam:   VS:  BP 125/69   Pulse 76   Ht 5' 1 (1.549 m)   Wt 132 lb 3.2 oz (60 kg)   SpO2 98%   BMI 24.98 kg/m    Wt Readings from Last 3 Encounters:  12/23/23 132 lb 3.2 oz (60 kg)  12/23/23 131 lb (59.4 kg)  12/16/23 137 lb (62.1 kg)    GEN: Well nourished, well developed in no acute distress NECK: No JVD; No carotid bruits CARDIAC: RRR, no murmurs, rubs, gallops RESPIRATORY:  Clear to auscultation without rales, wheezing or rhonchi  ABDOMEN: Soft, non-tender, non-distended EXTREMITIES:  No edema; No acute deformity      Assessment and Plan:  Coronary artery disease S/p BMS RCA in 2001 Lexiscan  Myoview  03/2020 without ischemia or infarction Echocardiogram 09/2021 with LVEF 60 to 65%, no RWMA Exercise Myoview  04/2023 low risk with no ischemia or infarction EKG today without  acute ischemic changes - Today patient is without anginal symptoms, no indication for further ischemic evaluation at this time - Continue aspirin  81 mg daily and rosuvastatin  20 mg daily  Hypertension Blood pressure today is 125/69 and well-controlled - Continue amlodipine  10 mg daily, carvedilol  6.25 mg twice daily, irbesartan -hydrochlorothiazide  300-12.5 mg daily  Hyperlipidemia, LDL goal <70 LDL 145 on 06/2023 and uncontrolled I am unsure if she is actually taking her statin and I have encouraged compliance - Plan to repeat fasting blood panel at follow-up visit - Continue rosuvastatin  20 mg daily  Bronchiectasis Diagnosis by HRCT on 10/2019 - No recent extirpation - Management per pulmonology  Preoperative cardiovascular exam According to the Revised Cardiac Risk Index (RCRI), her Perioperative Risk of Major Cardiac Event is (%): 0.9 Her Functional Capacity in METs is: 5.07 according to the Duke Activity Status Index (DASI). Therefore, based on ACC/AHA guidelines, patient would be at acceptable risk for the planned procedure without further cardiovascular testing. I will route this recommendation to the requesting party via Epic fax function.  She may hold aspirin  for 5-7 days prior to procedure. Please resume aspirin  as soon as possible postprocedure, at the discretion of the surgeon.  Dispo:  Return in about 6 months (around 06/24/2024).  Signed, Lum LITTIE Louis, NP

## 2023-12-24 ENCOUNTER — Encounter (HOSPITAL_COMMUNITY): Payer: Self-pay

## 2023-12-24 NOTE — Anesthesia Preprocedure Evaluation (Addendum)
 Anesthesia Evaluation  Patient identified by MRN, date of birth, ID band Patient awake    Reviewed: Allergy & Precautions, NPO status , Patient's Chart, lab work & pertinent test results  Airway Mallampati: II  TM Distance: >3 FB Neck ROM: Full    Dental no notable dental hx.    Pulmonary sleep apnea , COPD, former smoker   Pulmonary exam normal        Cardiovascular hypertension, Pt. on medications and Pt. on home beta blockers + CAD, + Cardiac Stents (2001) and + DOE   Rhythm:Regular Rate:Normal  Echo 10/08/2021: IMPRESSIONS   1. Left ventricular ejection fraction, by estimation, is 60 to 65%. The  left ventricle has normal function. The left ventricle has no regional  wall motion abnormalities. Left ventricular diastolic parameters are  consistent with Grade I diastolic  dysfunction (impaired relaxation). The average left ventricular global  longitudinal strain is -18.1 %. The global longitudinal strain is normal.   2. Right ventricular systolic function is normal. The right ventricular  size is normal. There is normal pulmonary artery systolic pressure. The  estimated right ventricular systolic pressure is 17.6 mmHg.   3. The mitral valve is grossly normal. Trivial mitral valve  regurgitation.   4. The aortic valve was not well visualized. Aortic valve regurgitation  is not visualized. Aortic valve sclerosis/calcification is present,  without any evidence of aortic stenosis. Aortic valve mean gradient  measures 4.0 mmHg.   5. The inferior vena cava is normal in size with greater than 50%  respiratory variability, suggesting right atrial pressure of 3 mmHg.   Comparison(s): Prior images unable to be directly viewed.     Neuro/Psych   Anxiety Depression    negative neurological ROS     GI/Hepatic Neg liver ROS,GERD  ,,  Endo/Other  diabetes, Type 2, Oral Hypoglycemic Agents    Renal/GU CRFRenal disease  negative  genitourinary   Musculoskeletal  (+) Arthritis , Osteoarthritis,    Abdominal Normal abdominal exam  (+)   Peds  Hematology  (+) Blood dyscrasia, anemia Lab Results      Component                Value               Date                      WBC                      9.2                 12/23/2023                HGB                      11.8 (L)            12/23/2023                HCT                      38.9                12/23/2023                MCV                      78.6 (L)  12/23/2023                PLT                      268                 12/23/2023             Lab Results      Component                Value               Date                      NA                       137                 12/23/2023                K                        4.1                 12/23/2023                CO2                      27                  12/23/2023                GLUCOSE                  105 (H)             12/23/2023                BUN                      24 (H)              12/23/2023                CREATININE               1.14 (H)            12/23/2023                CALCIUM                   9.6                 12/23/2023                EGFR                     46 (L)              07/03/2022                GFRNONAA                 48 (L)              12/23/2023              Anesthesia Other Findings   Reproductive/Obstetrics  Anesthesia Physical Anesthesia Plan  ASA: 3  Anesthesia Plan: General   Post-op Pain Management: Tylenol  PO (pre-op)*   Induction: Intravenous  PONV Risk Score and Plan: 3 and Ondansetron , Dexamethasone  and Treatment may vary due to age or medical condition  Airway Management Planned: Mask and Oral ETT  Additional Equipment: None  Intra-op Plan:   Post-operative Plan: Extubation in OR  Informed Consent: I have reviewed the patients History and Physical, chart, labs  and discussed the procedure including the risks, benefits and alternatives for the proposed anesthesia with the patient or authorized representative who has indicated his/her understanding and acceptance.     Dental advisory given  Plan Discussed with: CRNA  Anesthesia Plan Comments: (See PAT note from 7/23)         Anesthesia Quick Evaluation

## 2023-12-24 NOTE — Progress Notes (Addendum)
 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  Patient underwent left inguinal node biopsy on 10/21/23. It was positive for metastatic cancer, mostly likely ovarian primary.  Patient follows with Cardiology for hx of CAD s/p PCI to RCA in 2001. Echocardiogram 09/2021 with LVEF 60 to 65%, no RWMA. She underwent stress testing in Nov 2024 due to chest pain which was low risk with no ischemia or infarction. Seen in clinic on 7/23. Patient asymptomatic from cardiac standpoint and cleared for surgery:  Preoperative cardiovascular exam According to the Revised Cardiac Risk Index (RCRI), her Perioperative Risk of Major Cardiac Event is (%): 0.9 Her Functional Capacity in METs is: 5.07 according to the Duke Activity Status Index (DASI). Therefore, based on ACC/AHA guidelines, patient would be at acceptable risk for the planned procedure without further cardiovascular testing.  Patient follows with Pulmonology due to bronchiectasis by HRCT in 2021. Last seen by Dr. Darlean in 07/2023. Doing well at that time. Advised continue current meds and f/u with Pulm prn.  VS: BP 132/74   Pulse 66   Temp 37.1 C (Oral)   Resp 16   Ht 5' 1 (1.549 m)   Wt 59.4 kg   SpO2 100%   BMI 24.75 kg/m   PROVIDERS: Benjamine Aland, MD   LABS: Labs reviewed: Acceptable for surgery.  CKD and anemia stable. (all labs ordered are listed, but only abnormal results are displayed)  Labs Reviewed  CBC - Abnormal; Notable for the following components:      Result Value   Hemoglobin 11.8 (*)    MCV 78.6 (*)    MCH 23.8 (*)    All other components within normal limits  COMPREHENSIVE METABOLIC PANEL WITH GFR - Abnormal; Notable for the following components:   Glucose, Bld 105 (*)    BUN 24 (*)    Creatinine, Ser 1.14 (*)    GFR, Estimated 48 (*)    All other components within normal limits  GLUCOSE, CAPILLARY - Abnormal; Notable for the following components:   Glucose-Capillary 106 (*)    All other components within normal limits  HEMOGLOBIN A1C  TYPE AND SCREEN     IMAGES: PET scan 10/01/23:  IMPRESSION: 1. Hypermetabolic left inguinal lymph node, maximum SUV 9.2. Given the high SUV, neoplastic involvement is a distinct possibility tissue sampling should be considered. 2. Multifocal accentuated activity in the colon is likely physiologic given the lack of correlate of CT findings, although technically nonspecific. 3. Right mastectomy. 4. Coronary, aortic arch, systemic, and branch vessel atherosclerotic vascular disease. Aortic Atherosclerosis (ICD10-I70.0). 5. Grade 1 anterolisthesis at L4-5.  EKG 12/23/23  Normal sinus , rate 80 Incomplete right bundle branch block   CV:  Stress test 04/24/23:  Narrative & Impression     Patient exercised for 5 minutes and 16  seconds, per Bruce protocol, achieving 7.00 METS. Normal exercise capacity. Elevated BP response. No angina during the test. Test was stopped due to dyspnea. Stress ECG is negative for ischemia and arrhythmias.   LV perfusion is normal. There is no evidence of ischemia. There is no evidence of infarction.   Left ventricular function is normal. Nuclear stress EF: 87%.   Findings are consistent with no ischemia and no infarction. The study is low risk  Echo 10/08/2021:  IMPRESSIONS     1. Left  ventricular ejection fraction, by estimation, is 60 to 65%. The  left ventricle has normal function. The left ventricle has no regional  wall motion abnormalities. Left ventricular diastolic parameters are  consistent with Grade I diastolic  dysfunction (impaired relaxation). The average left ventricular global  longitudinal strain is -18.1 %. The global longitudinal strain is normal.   2. Right ventricular systolic function is normal. The right ventricular  size is normal. There is normal pulmonary artery systolic pressure. The  estimated right ventricular systolic pressure is 17.6 mmHg.   3. The mitral valve is grossly normal. Trivial mitral valve  regurgitation.   4. The aortic valve was not well visualized. Aortic valve regurgitation  is not visualized. Aortic valve sclerosis/calcification is present,  without any evidence of aortic stenosis. Aortic valve mean gradient  measures 4.0 mmHg.   5. The inferior vena cava is normal in size with greater than 50%  respiratory variability, suggesting right atrial pressure of 3 mmHg.   Comparison(s): Prior images unable to be directly viewed.  Past Medical History:  Diagnosis Date   Anxiety    Arteriosclerotic cardiovascular disease (ASCVD) 2001   Bare-metal stent placed in the right coronary artery in 12/01; residual 50% lesion of the first diagonal and mid LAD   Arthritis    Breast cancer (HCC)    right 2021   Cerebrovascular disease    Chronic kidney disease    COPD (chronic obstructive pulmonary disease) (HCC)    Coronary artery disease    Stent   Cyst, dermoid, arm, left 03/26/2018   Depression    Diabetes mellitus    excellent control with a low-dose of a single oral agent   Family history of breast cancer 01/23/2021   Family history of ovarian cancer 01/23/2021   GERD (gastroesophageal reflux disease)    with ulcers   History of right coronary artery stent placement 2000   Hyperlipidemia    Hypertension    Sleep apnea     no cpap   Thalassemia minor    Tobacco abuse, in remission    35 pack years; Quit in 1980    Past Surgical History:  Procedure Laterality Date   BIOPSY  02/28/2020   Procedure: BIOPSY;  Surgeon: [name], DO;  [facility];  Service: Endoscopy;;   CARDIAC CATHETERIZATION     stent   COLONOSCOPY  2006   Dr. Kristie: normal   COLONOSCOPY  2000   Dr. Gardiner: normal    COLONOSCOPY N/A 03/03/2016   Dr. Harvey: diverticulosis, non-bleeding hemorrhoids, redundant left colon.   DILATION AND CURETTAGE OF UTERUS     ESOPHAGOGASTRODUODENOSCOPY  2000   Dr. Gardiner: gastritis    ESOPHAGOGASTRODUODENOSCOPY  2006   Dr. Kristie: small hiatal hernia, gastritis, negative H.pylori    ESOPHAGOGASTRODUODENOSCOPY N/A 03/03/2016   Procedure: ESOPHAGOGASTRODUODENOSCOPY (EGD);  Surgeon: Margo LITTIE Harvey, MD;  [facility];  Service: Endoscopy;  Laterality: N/A;   ESOPHAGOGASTRODUODENOSCOPY N/A 06/03/2016  Dr. Harvey: nonaggressive gastritis due to aspirin  use. Previous ulcers had healed.   ESOPHAGOGASTRODUODENOSCOPY (EGD) WITH PROPOFOL  N/A 02/28/2020   focal inflammation in the gastric antrum consistent with gastritis on biopsies. No H.pylori.    EXCISION OF KELOID Left 03/26/2018   Procedure: EXCISION OF LEFT ARM MASS;  Surgeon: Gail Favorite, MD;  Location: [facility];  Service: General;  Laterality: Left;   GIVENS CAPSULE STUDY N/A 10/10/2020   Procedure: GIVENS CAPSULE STUDY;  Surgeon: [name], DO;  [facility];  Service: Endoscopy;  Laterality: N/A;  7:30am   INGUINAL HERNIA REPAIR  1970s   Right   INGUINAL LYMPH NODE BIOPSY Left 10/21/2023   Procedure: BIOPSY, LYMPH NODE, INGUINAL, OPEN;  Surgeon: Evonnie Dorothyann LABOR, DO;  Location: [facility];  Service: General;  Laterality: Left;   TOTAL MASTECTOMY Right 02/20/2021   Procedure: RIGHT TOTAL MASTECTOMY;  Surgeon: Ebbie Cough, MD;  Location: [facility];  Service: General;  Laterality:  Right;   VAGINAL HYSTERECTOMY  1980   Unilateral oophorectomy    MEDICATIONS:  amLODipine  (NORVASC ) 10 MG tablet   aspirin  EC 81 MG tablet   carvedilol  (COREG ) 6.25 MG tablet   Cholecalciferol (VITAMIN D3) 50 MCG (2000 UT) TABS   cyanocobalamin  (VITAMIN B12) 1000 MCG tablet   docusate sodium  (COLACE) 100 MG capsule   donepezil  (ARICEPT ) 5 MG tablet   feeding supplement (ENSURE ENLIVE / ENSURE PLUS) LIQD   ferrous sulfate  325 (65 FE) MG EC tablet   irbesartan -hydrochlorothiazide  (AVALIDE) 300-12.5 MG tablet   metFORMIN  (GLUCOPHAGE ) 500 MG tablet   mirtazapine  (REMERON ) 7.5 MG tablet   ONETOUCH ULTRA test strip   oxyCODONE  (ROXICODONE ) 5 MG immediate release tablet   rosuvastatin  (CRESTOR ) 20 MG tablet   senna-docusate (SENOKOT-S) 8.6-50 MG tablet   triamcinolone ointment (KENALOG) 0.5 %   No current facility-administered medications for this encounter.   Burnard CHRISTELLA Odis DEVONNA [facility] Short Stay/Anesthesiology [facility] Phone [phone] 12/24/2023 3:14 PM

## 2023-12-28 ENCOUNTER — Telehealth: Payer: Self-pay | Admitting: *Deleted

## 2023-12-28 NOTE — Telephone Encounter (Signed)
 Telephone call to check on pre-operative status.  Patient and her sister [name] are  compliant with pre-operative instructions.  Reinforced nothing to eat after midnight. Clear liquids until 0915. Patient to arrive at 1015.  No questions or concerns voiced.  Instructed to call for any needs.

## 2023-12-28 NOTE — Telephone Encounter (Signed)
 Attempted to reach patient/and patient's daughter for pre-op call. Left voicemail requesting call back.

## 2023-12-29 ENCOUNTER — Encounter (HOSPITAL_COMMUNITY)
Admission: RE | Disposition: A | Discharge status: Home Health | Payer: Self-pay | Source: Home / Self Care | Attending: Psychiatry

## 2023-12-29 ENCOUNTER — Inpatient Hospital Stay (HOSPITAL_COMMUNITY)
Admission: RE | Admit: 2023-12-29 | Discharge: 2023-12-30 | DRG: 738 | Disposition: A | Discharge status: Home Health | Attending: Psychiatry | Admitting: Psychiatry

## 2023-12-29 ENCOUNTER — Inpatient Hospital Stay (HOSPITAL_COMMUNITY): Payer: Self-pay

## 2023-12-29 ENCOUNTER — Inpatient Hospital Stay (HOSPITAL_COMMUNITY): Payer: Self-pay | Admitting: Medical

## 2023-12-29 DIAGNOSIS — E785 Hyperlipidemia, unspecified: Secondary | ICD-10-CM | POA: Diagnosis present

## 2023-12-29 DIAGNOSIS — I2510 Atherosclerotic heart disease of native coronary artery without angina pectoris: Secondary | ICD-10-CM | POA: Diagnosis present

## 2023-12-29 DIAGNOSIS — G4730 Sleep apnea, unspecified: Secondary | ICD-10-CM | POA: Diagnosis present

## 2023-12-29 DIAGNOSIS — Z7984 Long term (current) use of oral hypoglycemic drugs: Secondary | ICD-10-CM

## 2023-12-29 DIAGNOSIS — Z79899 Other long term (current) drug therapy: Secondary | ICD-10-CM

## 2023-12-29 DIAGNOSIS — Z8249 Family history of ischemic heart disease and other diseases of the circulatory system: Secondary | ICD-10-CM | POA: Diagnosis not present

## 2023-12-29 DIAGNOSIS — E871 Hypo-osmolality and hyponatremia: Secondary | ICD-10-CM

## 2023-12-29 DIAGNOSIS — C5702 Malignant neoplasm of left fallopian tube: Secondary | ICD-10-CM | POA: Diagnosis not present

## 2023-12-29 DIAGNOSIS — Z833 Family history of diabetes mellitus: Secondary | ICD-10-CM | POA: Diagnosis not present

## 2023-12-29 DIAGNOSIS — C569 Malignant neoplasm of unspecified ovary: Principal | ICD-10-CM | POA: Diagnosis present

## 2023-12-29 DIAGNOSIS — Z9011 Acquired absence of right breast and nipple: Secondary | ICD-10-CM

## 2023-12-29 DIAGNOSIS — Z853 Personal history of malignant neoplasm of breast: Secondary | ICD-10-CM

## 2023-12-29 DIAGNOSIS — Z8041 Family history of malignant neoplasm of ovary: Secondary | ICD-10-CM

## 2023-12-29 DIAGNOSIS — Z820 Family history of epilepsy and other diseases of the nervous system: Secondary | ICD-10-CM

## 2023-12-29 DIAGNOSIS — K5520 Angiodysplasia of colon without hemorrhage: Secondary | ICD-10-CM

## 2023-12-29 DIAGNOSIS — Z8049 Family history of malignant neoplasm of other genital organs: Secondary | ICD-10-CM

## 2023-12-29 DIAGNOSIS — J449 Chronic obstructive pulmonary disease, unspecified: Secondary | ICD-10-CM

## 2023-12-29 DIAGNOSIS — Z955 Presence of coronary angioplasty implant and graft: Secondary | ICD-10-CM | POA: Diagnosis not present

## 2023-12-29 DIAGNOSIS — C577 Malignant neoplasm of other specified female genital organs: Secondary | ICD-10-CM

## 2023-12-29 DIAGNOSIS — Z801 Family history of malignant neoplasm of trachea, bronchus and lung: Secondary | ICD-10-CM

## 2023-12-29 DIAGNOSIS — E119 Type 2 diabetes mellitus without complications: Secondary | ICD-10-CM | POA: Diagnosis not present

## 2023-12-29 DIAGNOSIS — Z803 Family history of malignant neoplasm of breast: Secondary | ICD-10-CM

## 2023-12-29 DIAGNOSIS — C563 Malignant neoplasm of bilateral ovaries: Principal | ICD-10-CM | POA: Diagnosis present

## 2023-12-29 DIAGNOSIS — Z87891 Personal history of nicotine dependence: Secondary | ICD-10-CM

## 2023-12-29 DIAGNOSIS — I10 Essential (primary) hypertension: Secondary | ICD-10-CM | POA: Diagnosis present

## 2023-12-29 DIAGNOSIS — C763 Malignant neoplasm of pelvis: Secondary | ICD-10-CM | POA: Diagnosis present

## 2023-12-29 DIAGNOSIS — H9190 Unspecified hearing loss, unspecified ear: Secondary | ICD-10-CM | POA: Diagnosis present

## 2023-12-29 DIAGNOSIS — C774 Secondary and unspecified malignant neoplasm of inguinal and lower limb lymph nodes: Secondary | ICD-10-CM | POA: Diagnosis not present

## 2023-12-29 DIAGNOSIS — C5701 Malignant neoplasm of right fallopian tube: Secondary | ICD-10-CM

## 2023-12-29 DIAGNOSIS — Z807 Family history of other malignant neoplasms of lymphoid, hematopoietic and related tissues: Secondary | ICD-10-CM

## 2023-12-29 DIAGNOSIS — E876 Hypokalemia: Secondary | ICD-10-CM

## 2023-12-29 HISTORY — PX: ROBOTIC ASSISTED BILATERAL SALPINGO OOPHERECTOMY: SHX6078

## 2023-12-29 HISTORY — PX: INGUINAL LYMPHADENECTOMY: SHX6587

## 2023-12-29 LAB — GLUCOSE, CAPILLARY
Glucose-Capillary: 104 mg/dL — ABNORMAL HIGH (ref 70–99)
Glucose-Capillary: 90 mg/dL (ref 70–99)
Glucose-Capillary: 122 mg/dL — ABNORMAL HIGH (ref 70–99)
Glucose-Capillary: 156 mg/dL — ABNORMAL HIGH (ref 70–99)

## 2023-12-29 LAB — TYPE AND SCREEN
ABO/RH(D): O POS
Antibody Screen: NEGATIVE

## 2023-12-29 SURGERY — SALPINGO-OOPHORECTOMY, BILATERAL, ROBOT-ASSISTED
Anesthesia: General | Laterality: Left

## 2023-12-29 MED ORDER — DROPERIDOL 2.5 MG/ML IJ SOLN: 0.6250 mg | Freq: Once | INTRAMUSCULAR | Status: DC | PRN

## 2023-12-29 MED ORDER — BUPIVACAINE HCL 0.25 % IJ SOLN
INTRAMUSCULAR | Status: DC | PRN
  Administered 2023-12-29: 30 mL

## 2023-12-29 MED ORDER — ACETAMINOPHEN 500 MG PO TABS
1000.0000 mg | ORAL_TABLET | Freq: Two times a day (BID) | ORAL | Status: DC
  Administered 2023-12-30: 1000 mg via ORAL
  Filled 2023-12-29 (×2): qty 2

## 2023-12-29 MED ORDER — AMLODIPINE BESYLATE 10 MG PO TABS
10.0000 mg | ORAL_TABLET | Freq: Every day | ORAL | Status: DC
  Administered 2023-12-30: 10 mg via ORAL
  Filled 2023-12-29: qty 1

## 2023-12-29 MED ORDER — PHENYLEPHRINE HCL (PRESSORS) 10 MG/ML IV SOLN
INTRAVENOUS | Status: AC
  Filled 2023-12-29: qty 1

## 2023-12-29 MED ORDER — DEXAMETHASONE SODIUM PHOSPHATE 10 MG/ML IJ SOLN
4.0000 mg | INTRAMUSCULAR | Status: AC
  Administered 2023-12-29: 4 mg via INTRAVENOUS

## 2023-12-29 MED ORDER — PHENYLEPHRINE 80 MCG/ML (10ML) SYRINGE FOR IV PUSH (FOR BLOOD PRESSURE SUPPORT)
PREFILLED_SYRINGE | INTRAVENOUS | Status: DC | PRN
  Administered 2023-12-29: 40 ug via INTRAVENOUS
  Administered 2023-12-29: 160 ug via INTRAVENOUS
  Administered 2023-12-29: 40 ug via INTRAVENOUS
  Administered 2023-12-29: 160 ug via INTRAVENOUS

## 2023-12-29 MED ORDER — SODIUM CHLORIDE 0.45 % IV SOLN: INTRAVENOUS | Status: DC

## 2023-12-29 MED ORDER — BUPIVACAINE HCL (PF) 0.25 % IJ SOLN
INTRAMUSCULAR | Status: AC
  Filled 2023-12-29: qty 30

## 2023-12-29 MED ORDER — PHENYLEPHRINE HCL-NACL 20-0.9 MG/250ML-% IV SOLN
INTRAVENOUS | Status: DC | PRN
  Administered 2023-12-29: 25 ug/min via INTRAVENOUS

## 2023-12-29 MED ORDER — PHENYLEPHRINE 80 MCG/ML (10ML) SYRINGE FOR IV PUSH (FOR BLOOD PRESSURE SUPPORT)
PREFILLED_SYRINGE | INTRAVENOUS | Status: AC
Start: 2023-12-29 — End: 2023-12-29
  Filled 2023-12-29: qty 10

## 2023-12-29 MED ORDER — LACTATED RINGERS IV SOLN: INTRAVENOUS | Status: DC | PRN

## 2023-12-29 MED ORDER — INSULIN ASPART 100 UNIT/ML IJ SOLN: 0.0000 [IU] | INTRAMUSCULAR | Status: DC | PRN

## 2023-12-29 MED ORDER — ONDANSETRON HCL 4 MG PO TABS: 4.0000 mg | ORAL_TABLET | Freq: Four times a day (QID) | ORAL | Status: DC | PRN

## 2023-12-29 MED ORDER — HYDROMORPHONE HCL 1 MG/ML IJ SOLN: 0.2500 mg | INTRAMUSCULAR | Status: DC | PRN

## 2023-12-29 MED ORDER — MIRTAZAPINE 15 MG PO TABS
7.5000 mg | ORAL_TABLET | Freq: Every day | ORAL | Status: DC
Start: 2023-12-29 — End: 2023-12-30
  Administered 2023-12-29: 7.5 mg via ORAL
  Filled 2023-12-29: qty 1

## 2023-12-29 MED ORDER — ALBUMIN HUMAN 5 % IV SOLN
INTRAVENOUS | Status: DC | PRN
Start: 2023-12-29 — End: 2023-12-29

## 2023-12-29 MED ORDER — ACETAMINOPHEN 500 MG PO TABS: 1000.0000 mg | ORAL_TABLET | Freq: Once | ORAL | Status: DC

## 2023-12-29 MED ORDER — PROPOFOL 1000 MG/100ML IV EMUL
INTRAVENOUS | Status: AC
  Filled 2023-12-29: qty 100

## 2023-12-29 MED ORDER — CEFAZOLIN SODIUM-DEXTROSE 2-4 GM/100ML-% IV SOLN
2.0000 g | INTRAVENOUS | Status: AC
  Administered 2023-12-29: 2 g via INTRAVENOUS
  Filled 2023-12-29: qty 100

## 2023-12-29 MED ORDER — DEXAMETHASONE SODIUM PHOSPHATE 10 MG/ML IJ SOLN
INTRAMUSCULAR | Status: AC
  Filled 2023-12-29: qty 1

## 2023-12-29 MED ORDER — PROPOFOL 10 MG/ML IV BOLUS
INTRAVENOUS | Status: AC
  Filled 2023-12-29: qty 20

## 2023-12-29 MED ORDER — SUGAMMADEX SODIUM 200 MG/2ML IV SOLN
INTRAVENOUS | Status: DC | PRN
  Administered 2023-12-29: 120 mg via INTRAVENOUS

## 2023-12-29 MED ORDER — OXYCODONE HCL 5 MG PO TABS: 5.0000 mg | ORAL_TABLET | ORAL | Status: DC | PRN

## 2023-12-29 MED ORDER — BUPIVACAINE LIPOSOME 1.3 % IJ SUSP
INTRAMUSCULAR | Status: DC | PRN
  Administered 2023-12-29: 20 mL

## 2023-12-29 MED ORDER — ONDANSETRON HCL 4 MG/2ML IJ SOLN
INTRAMUSCULAR | Status: DC | PRN
  Administered 2023-12-29: 4 mg via INTRAVENOUS

## 2023-12-29 MED ORDER — ONDANSETRON HCL 4 MG/2ML IJ SOLN
INTRAMUSCULAR | Status: AC
  Filled 2023-12-29: qty 2

## 2023-12-29 MED ORDER — ALBUMIN HUMAN 5 % IV SOLN
INTRAVENOUS | Status: AC
  Filled 2023-12-29: qty 250

## 2023-12-29 MED ORDER — SENNOSIDES-DOCUSATE SODIUM 8.6-50 MG PO TABS
2.0000 | ORAL_TABLET | Freq: Every day | ORAL | Status: DC
  Administered 2023-12-29: 2 via ORAL
  Filled 2023-12-29: qty 2

## 2023-12-29 MED ORDER — METFORMIN HCL 500 MG PO TABS
500.0000 mg | ORAL_TABLET | Freq: Every day | ORAL | Status: DC
  Administered 2023-12-30: 500 mg via ORAL
  Filled 2023-12-29: qty 1

## 2023-12-29 MED ORDER — FENTANYL CITRATE (PF) 250 MCG/5ML IJ SOLN
INTRAMUSCULAR | Status: DC | PRN
  Administered 2023-12-29 (×2): 100 ug via INTRAVENOUS
  Administered 2023-12-29: 50 ug via INTRAVENOUS

## 2023-12-29 MED ORDER — ROCURONIUM BROMIDE 10 MG/ML (PF) SYRINGE
PREFILLED_SYRINGE | INTRAVENOUS | Status: AC
  Filled 2023-12-29: qty 10

## 2023-12-29 MED ORDER — FENTANYL CITRATE (PF) 100 MCG/2ML IJ SOLN
INTRAMUSCULAR | Status: DC | PRN
  Administered 2023-12-29 (×2): 50 ug via INTRAVENOUS

## 2023-12-29 MED ORDER — TRAMADOL HCL 50 MG PO TABS
100.0000 mg | ORAL_TABLET | Freq: Two times a day (BID) | ORAL | Status: DC | PRN
  Administered 2023-12-29 – 2023-12-30 (×2): 100 mg via ORAL
  Filled 2023-12-29 (×2): qty 2

## 2023-12-29 MED ORDER — PROPOFOL 500 MG/50ML IV EMUL
INTRAVENOUS | Status: AC
Start: 2023-12-29 — End: 2023-12-29
  Filled 2023-12-29: qty 50

## 2023-12-29 MED ORDER — ENOXAPARIN SODIUM 40 MG/0.4ML IJ SOSY
40.0000 mg | PREFILLED_SYRINGE | INTRAMUSCULAR | Status: DC
  Administered 2023-12-30: 40 mg via SUBCUTANEOUS
  Filled 2023-12-29: qty 0.4

## 2023-12-29 MED ORDER — LACTATED RINGERS IV SOLN: INTRAVENOUS | Status: DC

## 2023-12-29 MED ORDER — INSULIN ASPART 100 UNIT/ML IJ SOLN
0.0000 [IU] | Freq: Three times a day (TID) | INTRAMUSCULAR | Status: DC
  Administered 2023-12-30: 1 [IU] via SUBCUTANEOUS

## 2023-12-29 MED ORDER — PROPOFOL 500 MG/50ML IV EMUL
INTRAVENOUS | Status: DC | PRN
  Administered 2023-12-29: 60 ug/kg/min via INTRAVENOUS

## 2023-12-29 MED ORDER — ROSUVASTATIN CALCIUM 10 MG PO TABS
20.0000 mg | ORAL_TABLET | Freq: Every day | ORAL | Status: DC
  Administered 2023-12-29: 20 mg via ORAL
  Filled 2023-12-29: qty 2

## 2023-12-29 MED ORDER — SUGAMMADEX SODIUM 200 MG/2ML IV SOLN
INTRAVENOUS | Status: AC
  Filled 2023-12-29: qty 2

## 2023-12-29 MED ORDER — LIDOCAINE HCL (PF) 2 % IJ SOLN
INTRAMUSCULAR | Status: AC
  Filled 2023-12-29: qty 5

## 2023-12-29 MED ORDER — CARVEDILOL 6.25 MG PO TABS
6.2500 mg | ORAL_TABLET | Freq: Two times a day (BID) | ORAL | Status: DC
  Administered 2023-12-29 – 2023-12-30 (×2): 6.25 mg via ORAL
  Filled 2023-12-29 (×2): qty 1

## 2023-12-29 MED ORDER — CHLORHEXIDINE GLUCONATE 0.12 % MT SOLN
15.0000 mL | Freq: Once | OROMUCOSAL | Status: AC
  Administered 2023-12-29: 15 mL via OROMUCOSAL

## 2023-12-29 MED ORDER — ROCURONIUM BROMIDE 100 MG/10ML IV SOLN
INTRAVENOUS | Status: DC | PRN
  Administered 2023-12-29: 30 mg via INTRAVENOUS
  Administered 2023-12-29: 20 mg via INTRAVENOUS
  Administered 2023-12-29: 50 mg via INTRAVENOUS

## 2023-12-29 MED ORDER — DEXMEDETOMIDINE HCL IN NACL 80 MCG/20ML IV SOLN
INTRAVENOUS | Status: AC
  Filled 2023-12-29: qty 20

## 2023-12-29 MED ORDER — STERILE WATER FOR IRRIGATION IR SOLN
Status: DC | PRN
  Administered 2023-12-29: 1000 mL

## 2023-12-29 MED ORDER — LIDOCAINE HCL (CARDIAC) PF 100 MG/5ML IV SOSY
PREFILLED_SYRINGE | INTRAVENOUS | Status: DC | PRN
  Administered 2023-12-29: 60 mg via INTRAVENOUS

## 2023-12-29 MED ORDER — FENTANYL CITRATE (PF) 100 MCG/2ML IJ SOLN
INTRAMUSCULAR | Status: AC
  Filled 2023-12-29: qty 2

## 2023-12-29 MED ORDER — ONDANSETRON HCL 4 MG/2ML IJ SOLN: 4.0000 mg | Freq: Four times a day (QID) | INTRAMUSCULAR | Status: DC | PRN

## 2023-12-29 MED ORDER — HYDROMORPHONE HCL 1 MG/ML IJ SOLN: 0.5000 mg | INTRAMUSCULAR | Status: DC | PRN

## 2023-12-29 MED ORDER — HEPARIN SODIUM (PORCINE) 5000 UNIT/ML IJ SOLN
5000.0000 [IU] | INTRAMUSCULAR | Status: AC
  Administered 2023-12-29: 5000 [IU] via SUBCUTANEOUS
  Filled 2023-12-29: qty 1

## 2023-12-29 MED ORDER — ORAL CARE MOUTH RINSE: 15.0000 mL | Freq: Once | OROMUCOSAL | Status: AC

## 2023-12-29 MED ORDER — BUPIVACAINE LIPOSOME 1.3 % IJ SUSP
INTRAMUSCULAR | Status: AC
  Filled 2023-12-29: qty 20

## 2023-12-29 MED ORDER — ACETAMINOPHEN 500 MG PO TABS
1000.0000 mg | ORAL_TABLET | ORAL | Status: DC
  Administered 2023-12-29: 1000 mg via ORAL
  Filled 2023-12-29: qty 2

## 2023-12-29 MED ORDER — FENTANYL CITRATE (PF) 250 MCG/5ML IJ SOLN
INTRAMUSCULAR | Status: AC
Start: 2023-12-29 — End: 2023-12-29
  Filled 2023-12-29: qty 5

## 2023-12-29 MED ORDER — POVIDONE-IODINE 10 % EX SWAB: 2.0000 | Freq: Once | CUTANEOUS | Status: DC

## 2023-12-29 MED ORDER — CELECOXIB 200 MG PO CAPS: 200.0000 mg | ORAL_CAPSULE | Freq: Once | ORAL | Status: DC

## 2023-12-29 MED ORDER — DONEPEZIL HCL 5 MG PO TABS
5.0000 mg | ORAL_TABLET | Freq: Every day | ORAL | Status: DC
  Filled 2023-12-29: qty 1

## 2023-12-29 MED ORDER — PROPOFOL 10 MG/ML IV BOLUS
INTRAVENOUS | Status: DC | PRN
  Administered 2023-12-29: 120 mg via INTRAVENOUS

## 2023-12-29 SURGICAL SUPPLY — 95 items
APPLICATOR SURGIFLO ENDO (HEMOSTASIS) IMPLANT
BAG COUNTER SPONGE SURGICOUNT (BAG) IMPLANT
BAG LAPAROSCOPIC 12 15 PORT 16 (BASKET) IMPLANT
BLADE SURG SZ10 CARB STEEL (BLADE) ×1 IMPLANT
BNDG GAUZE DERMACEA FLUFF 4 (GAUZE/BANDAGES/DRESSINGS) IMPLANT
CHLORAPREP W/TINT 26 (MISCELLANEOUS) ×4 IMPLANT
CLIP TI LARGE 6 (CLIP) IMPLANT
CLIP TI MEDIUM 6 (CLIP) ×5 IMPLANT
CLIP TI MEDIUM LARGE 6 (CLIP) IMPLANT
COVER BACK TABLE 60X90IN (DRAPES) ×3 IMPLANT
COVER SURGICAL LIGHT HANDLE (MISCELLANEOUS) ×3 IMPLANT
COVER TIP SHEARS 8 DVNC (MISCELLANEOUS) ×3 IMPLANT
DERMABOND ADVANCED .7 DNX12 (GAUZE/BANDAGES/DRESSINGS) ×3 IMPLANT
DRAIN CHANNEL 10F 3/8 F FF (DRAIN) IMPLANT
DRAPE ARM DVNC X/XI (DISPOSABLE) ×12 IMPLANT
DRAPE COLUMN DVNC XI (DISPOSABLE) ×3 IMPLANT
DRAPE LAPAROTOMY T 98X78 PEDS (DRAPES) ×2 IMPLANT
DRAPE SHEET LG 3/4 BI-LAMINATE (DRAPES) ×2 IMPLANT
DRAPE SURG IRRIG POUCH 19X23 (DRAPES) ×2 IMPLANT
DRAPE UNDERBUTTOCKS STRL (DISPOSABLE) ×2 IMPLANT
DRAPE UTILITY XL STRL (DRAPES) ×3 IMPLANT
DRIVER NDL MEGA SUTCUT DVNCXI (INSTRUMENTS) ×2 IMPLANT
DRIVER NDLE MEGA SUTCUT DVNCXI (INSTRUMENTS) ×3 IMPLANT
DRSG OPSITE POSTOP 4X6 (GAUZE/BANDAGES/DRESSINGS) ×1 IMPLANT
DRSG OPSITE POSTOP 4X8 (GAUZE/BANDAGES/DRESSINGS) IMPLANT
DRSG TEGADERM 4X4.75 (GAUZE/BANDAGES/DRESSINGS) IMPLANT
ELECT PENCIL ROCKER SW 15FT (MISCELLANEOUS) ×1 IMPLANT
ELECT REM PT RETURN 15FT ADLT (MISCELLANEOUS) ×3 IMPLANT
EVACUATOR SILICONE 100CC (DRAIN) IMPLANT
FORCEPS BPLR FENES DVNC XI (FORCEP) ×3 IMPLANT
FORCEPS PROGRASP DVNC XI (FORCEP) ×3 IMPLANT
GAUZE 4X4 16PLY ~~LOC~~+RFID DBL (SPONGE) ×3 IMPLANT
GLOVE BIO SURGEON STRL SZ 6.5 (GLOVE) ×3 IMPLANT
GLOVE BIOGEL PI IND STRL 6.5 (GLOVE) ×7 IMPLANT
GLOVE BIOGEL PI MICRO STRL 6 (GLOVE) ×12 IMPLANT
GOWN STRL REUS W/ TWL LRG LVL3 (GOWN DISPOSABLE) ×13 IMPLANT
GRASPER SUT TROCAR 14GX15 (MISCELLANEOUS) ×1 IMPLANT
HOLDER FOLEY CATH W/STRAP (MISCELLANEOUS) IMPLANT
IRRIGATION SUCT STRKRFLW 2 WTP (MISCELLANEOUS) ×3 IMPLANT
KIT BASIN OR (CUSTOM PROCEDURE TRAY) ×2 IMPLANT
KIT PROCEDURE DVNC SI (MISCELLANEOUS) IMPLANT
KIT TURNOVER KIT A (KITS) ×3 IMPLANT
LEGGING LITHOTOMY PAIR STRL (DRAPES) ×3 IMPLANT
LIGASURE IMPACT 36 18CM CVD LR (INSTRUMENTS) ×1 IMPLANT
MANIPULATOR ADVINCU DEL 3.0 PL (MISCELLANEOUS) IMPLANT
MANIPULATOR ADVINCU DEL 3.5 PL (MISCELLANEOUS) IMPLANT
MANIPULATOR UTERINE 4.5 ZUMI (MISCELLANEOUS) IMPLANT
NDL HYPO 21X1.5 SAFETY (NEEDLE) ×2 IMPLANT
NDL HYPO 22X1.5 SAFETY MO (MISCELLANEOUS) ×2 IMPLANT
NDL INSUFFLATION 14GA 120MM (NEEDLE) IMPLANT
NDL SPNL 20GX3.5 QUINCKE YW (NEEDLE) IMPLANT
NEEDLE HYPO 21X1.5 SAFETY (NEEDLE) IMPLANT
NEEDLE HYPO 22X1.5 SAFETY MO (MISCELLANEOUS) ×3 IMPLANT
NEEDLE INSUFFLATION 14GA 120MM (NEEDLE) IMPLANT
NEEDLE SPNL 20GX3.5 QUINCKE YW (NEEDLE) IMPLANT
OBTURATOR OPTICALSTD 8 DVNC (TROCAR) ×3 IMPLANT
PACK GENERAL/GYN (CUSTOM PROCEDURE TRAY) ×2 IMPLANT
PACK ROBOT GYN CUSTOM WL (TRAY / TRAY PROCEDURE) ×3 IMPLANT
PAD ARMBOARD POSITIONER FOAM (MISCELLANEOUS) ×3 IMPLANT
PAD POSITIONING PINK XL (MISCELLANEOUS) ×3 IMPLANT
PORT ACCESS TROCAR AIRSEAL 12 (TROCAR) ×1 IMPLANT
SCISSORS MNPLR CVD DVNC XI (INSTRUMENTS) ×3 IMPLANT
SCRUB CHG 4% DYNA-HEX 4OZ (MISCELLANEOUS) ×6 IMPLANT
SEAL UNIV 5-12 XI (MISCELLANEOUS) ×12 IMPLANT
SET TRI-LUMEN FLTR TB AIRSEAL (TUBING) ×3 IMPLANT
SHEARS HARMONIC 9CM CVD (BLADE) IMPLANT
SHEET LAVH (DRAPES) IMPLANT
SPIKE FLUID TRANSFER (MISCELLANEOUS) ×3 IMPLANT
SPONGE DRAIN TRACH 4X4 STRL 2S (GAUZE/BANDAGES/DRESSINGS) IMPLANT
SPONGE T-LAP 18X18 ~~LOC~~+RFID (SPONGE) ×1 IMPLANT
SURGIFLO W/THROMBIN 8M KIT (HEMOSTASIS) IMPLANT
SUT ETHILON 3 0 PS 1 (SUTURE) IMPLANT
SUT MNCRL AB 4-0 PS2 18 (SUTURE) ×4 IMPLANT
SUT PDS AB 1 TP1 54 (SUTURE) ×2 IMPLANT
SUT SILK 2-0 18XBRD TIE 12 (SUTURE) ×3 IMPLANT
SUT SILK 3-0 18XBRD TIE 12 (SUTURE) IMPLANT
SUT VIC AB 0 CT1 27XBRD ANTBC (SUTURE) IMPLANT
SUT VIC AB 2-0 CT1 TAPERPNT 27 (SUTURE) ×1 IMPLANT
SUT VIC AB 2-0 SH 18 (SUTURE) IMPLANT
SUT VIC AB 2-0 SH 27XBRD (SUTURE) ×3 IMPLANT
SUT VIC AB 3-0 SH 27X BRD (SUTURE) ×1 IMPLANT
SUT VIC AB 4-0 PS2 18 (SUTURE) ×7 IMPLANT
SUT VIC AB 4-0 PS2 27 (SUTURE) IMPLANT
SUT VICRYL 0 27 CT2 27 ABS (SUTURE) ×2 IMPLANT
SYR 10ML LL (SYRINGE) IMPLANT
SYR 20ML LL LF (SYRINGE) ×6 IMPLANT
SYSTEM BAG RETRIEVAL 10MM (BASKET) ×2 IMPLANT
SYSTEM WOUND ALEXIS 18CM MED (MISCELLANEOUS) ×1 IMPLANT
TOWEL OR 17X26 10 PK STRL BLUE (TOWEL DISPOSABLE) ×2 IMPLANT
TRAP SPECIMEN MUCUS 40CC (MISCELLANEOUS) IMPLANT
TRAY FOLEY MTR SLVR 16FR STAT (SET/KITS/TRAYS/PACK) ×3 IMPLANT
TROCAR PORT AIRSEAL 5X120 (TROCAR) IMPLANT
UNDERPAD 30X36 HEAVY ABSORB (UNDERPADS AND DIAPERS) ×6 IMPLANT
WATER STERILE IRR 1000ML POUR (IV SOLUTION) ×3 IMPLANT
YANKAUER SUCT BULB TIP 10FT TU (MISCELLANEOUS) ×1 IMPLANT

## 2023-12-29 NOTE — Discharge Instructions (Addendum)
 AFTER SURGERY INSTRUCTIONS   Return to work: 4-6 weeks if applicable  You will need to empty the JP drain several times a day and keep a log of the output. We will see you back in the office around one week for a drain check. When the amount decreases to a certain number, we will then be anle to remove the drain.   Activity: 1. Be up and out of the bed during the day.  Take a nap if needed.  You may walk up steps but be careful and use the hand rail.  Stair climbing will tire you more than you think, you may need to stop part way and rest.    2. No lifting or straining for 6 weeks over 10 pounds. No pushing, pulling, straining for 6 weeks.   3. No driving for 4-89 days when the following criteria have been met: Do not drive if you are taking narcotic pain medicine and make sure that your reaction time has returned.    4. You can shower as soon as the next day after surgery. Shower daily.  Use your regular soap and water  (not directly on the incision) and pat your incision(s) dry afterwards; don't rub.  No tub baths or submerging your body in water  until cleared by your surgeon. If you have the soap that was given to you by pre-surgical testing that was used before surgery, you do not need to use it afterwards because this can irritate your incisions.    5. No sexual activity and nothing in the vagina for 6 weeks.   6. You may experience a small amount of clear drainage from your incisions, which is normal.  If the drainage persists, increases, or changes color please call the office.   7. Do not use creams, lotions, or ointments such as neosporin on your incisions after surgery until advised by your surgeon because they can cause removal of the dermabond glue on your incisions.     8. Take Tylenol  first for pain if you are able to take these medication and only use narcotic pain medication for severe pain not relieved by the Tylenol .  Monitor your Tylenol  intake to a max of 4,000 mg in a 24 hour  period.    Diet: 1. Low sodium Heart Healthy Diet is recommended but you are cleared to resume your normal (before surgery) diet after your procedure.   2. It is safe to use a laxative, such as Miralax  or Colace, if you have difficulty moving your bowels before surgery. You have been prescribed Sennakot-S to take at bedtime every evening after surgery to keep bowel movements regular and to prevent constipation.     Wound Care: 1. Keep clean and dry.  Shower daily.   Reasons to call the Doctor: Fever - Oral temperature greater than 100.4 degrees Fahrenheit Foul-smelling vaginal discharge Difficulty urinating Nausea and vomiting Increased pain at the site of the incision that is unrelieved with pain medicine. Difficulty breathing with or without chest pain New calf pain especially if only on one side Sudden, continuing increased vaginal bleeding with or without clots.   Contacts: For questions or concerns you should contact:   Dr. Hoy Masters at [phone]   Eleanor Epps, NP at [phone]   After Hours: call [phone] and have the GYN Oncologist paged/contacted (after 5 pm or on the weekends). You will speak with an after hours RN and let he or she know you have had surgery.   Messages sent  via mychart are for non-urgent matters and are not responded to after hours so for urgent needs, please call the after hours number.

## 2023-12-29 NOTE — Interval H&P Note (Signed)
 History and Physical Interval Note:  12/29/2023 12:09 PM  Nancy Liu  has presented today for surgery, with the diagnosis of SEROUS CARCINOMA of GYN ORIGIN.  The various methods of treatment have been discussed with the patient and family. After consideration of risks, benefits and other options for treatment, the patient has consented to  Procedure(s) with comments: SALPINGO-OOPHORECTOMY, BILATERAL, ROBOT-ASSISTED (Bilateral) LAPAROTOMY (N/A) - MINI LAPAROTOMY OMENTECTOMY (N/A) LYMPHADENECTOMY, INGUINAL, OPEN (Left) - LEFT FEMORAL INGUINAL LYMPH NODE DISECTION as a surgical intervention.  The patient's history has been reviewed, patient examined, no change in status, stable for surgery.  I have reviewed the patient's chart and labs.  Questions were answered to the patient's satisfaction.     Arely Tinner

## 2023-12-29 NOTE — Anesthesia Procedure Notes (Signed)
 Procedure Name: Intubation Date/Time: 12/29/2023 1:24 PM  Performed by: Landy Chip HERO, CRNAPre-anesthesia Checklist: Patient identified, Emergency Drugs available, Suction available and Patient being monitored Patient Re-evaluated:Patient Re-evaluated prior to induction Oxygen  Delivery Method: Circle System Utilized Preoxygenation: Pre-oxygenation with 100% oxygen  Induction Type: IV induction Ventilation: Mask ventilation without difficulty Laryngoscope Size: Mac and 4 Grade View: Grade III Tube type: Oral Tube size: 7.0 mm Number of attempts: 1 Airway Equipment and Method: Stylet Placement Confirmation: ETT inserted through vocal cords under direct vision, positive ETCO2 and breath sounds checked- equal and bilateral Secured at: 20 cm Tube secured with: Tape Dental Injury: Teeth and Oropharynx as per pre-operative assessment

## 2023-12-29 NOTE — Transfer of Care (Signed)
 Immediate Anesthesia Transfer of Care Note  Patient: Nancy Liu  Procedure(s) Performed: ROBOTIC ASSISTED LAPAROSCOPIC BILATERAL SALPINGO-OOPHORECTOMY, MINI-LAPAROTOMY FOR OMENTECTOMY (Bilateral) LEFT INGUINOFEMORAL LYMPH NODE REMOVAL (Left)  Patient Location: PACU  Anesthesia Type:General  Level of Consciousness: drowsy  Airway & Oxygen  Therapy: Patient Spontanous Breathing and Patient connected to face mask oxygen   Post-op Assessment: Report given to RN and Post -op Vital signs reviewed and stable  Post vital signs: Reviewed and stable  Last Vitals:  Vitals Value Taken Time  BP 139/74 12/29/23 17:32  Temp    Pulse 74 12/29/23 17:33  Resp 13 12/29/23 17:34  SpO2 100 % 12/29/23 17:33  Vitals shown include unfiled device data.  Last Pain:  Vitals:   12/29/23 1008  TempSrc: Oral         Complications: No notable events documented.

## 2023-12-29 NOTE — Anesthesia Postprocedure Evaluation (Signed)
 Anesthesia Post Note  Patient: Nancy Liu  Procedure(s) Performed: ROBOTIC ASSISTED LAPAROSCOPIC BILATERAL SALPINGO-OOPHORECTOMY, MINI-LAPAROTOMY FOR OMENTECTOMY (Bilateral) LEFT INGUINOFEMORAL LYMPH NODE REMOVAL (Left)     Patient location during evaluation: PACU Anesthesia Type: General Level of consciousness: awake and alert Pain management: pain level controlled Vital Signs Assessment: post-procedure vital signs reviewed and stable Respiratory status: spontaneous breathing, nonlabored ventilation, respiratory function stable and patient connected to nasal cannula oxygen  Cardiovascular status: blood pressure returned to baseline and stable Postop Assessment: no apparent nausea or vomiting Anesthetic complications: no   No notable events documented.  Last Vitals:  Vitals:   12/29/23 1821 12/29/23 1846  BP:  134/71  Pulse: 79 73  Resp: 19 18  Temp:  (!) 36.3 C  SpO2: 99% 100%    Last Pain:  Vitals:   12/29/23 1846  TempSrc: Oral  PainSc:                  Marleny Faller E

## 2023-12-29 NOTE — Op Note (Signed)
 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  Postoperative Diagnosis: Same  Procedures: Robotic-assisted laparoscopic bilateral salpingo-oophorectomy, minilaparotomy for omentectomy, left inguinofemoral lymph node removal   Surgeon: Hoy Masters, MD  Assistants: Olam Mill, MD and (an MD assistant was necessary for tissue manipulation, management of robotic instrumentation, retraction and positioning due to the complexity of the case and hospital policies)  Anesthesia: General  Estimated Blood Loss: 50 mL    Fluids: 1350 ml crystalloid, 500 ml albumin   Urine Output: 375 ml, clear yellow  Findings: Approximately 1 cm palpable lymph node in the left groin.  Mildly enlarged lymph node identified and removed.  No other palpable lymph nodes identified in the left groin.  On entry to abdomen, upper abdominal survey with smooth diaphragm and liver.  Stomach with appearance of miliary implants along the greater curvature.  Omentum with white scarred areas, possible miliary disease.  Otherwise normal peritoneal surfaces, small bowel, mesentery.  In the pelvis, surgically absent cervix and uterus.  Overall grossly normal-appearing bilateral adnexa.  Residual fimbria noted on the right ovary.  No clearly identified residual fallopian tube on the left.  At time of omentectomy, additional miliary implants along the transverse colon.  R1 resection at conclusion of procedure.   Specimens:  ID Type Source Tests Collected by Time Destination  1 : Left inguinal lymph node Tissue PATH Lymph node biopsy SURGICAL PATHOLOGY Masters Hoy, MD 12/29/2023 1419   2 : Left ovary and tube Tissue PATH Gyn tumor resection SURGICAL PATHOLOGY Masters Hoy, MD 12/29/2023 1511   3 : Right ovary and tube Tissue PATH Gyn tumor resection SURGICAL PATHOLOGY Masters Hoy, MD 12/29/2023 1512   4 : Omentum Tissue PATH GI  tumor resection SURGICAL PATHOLOGY Masters Hoy, MD 12/29/2023 1532   5 : Stomach serosal biopsy Tissue Path Tissue SURGICAL PATHOLOGY Masters Hoy, MD 12/29/2023 1627     Complications:  None  Indications for Procedure: GARA KINCADE is a 83 y.o. woman with a history of breast cancer and unexplained weight loss who was found on PET to have a hypermetabolic left inguinal lymph node which was biopsied and noted to be consistent with serous carcinoma of GYN origin.  PET without areas of obvious additional disease.  Prior to the procedure, all risks, benefits, and alternatives were discussed and informed surgical consent was signed.  Procedure: Patient was taken to the operating room where general anesthesia was achieved.  She was positioned in dorsal lithotomy and prepped and draped.  A foley catheter was inserted into the bladder.   On the left side, a 4 cm skin incision was made with a scalpel roughly 2 finger-breadths below and parallel to the inguinal ligament, overlying the location of prior lymph node biopsy and palpable lymph node. Electrocautery was used to dissect down to the level of Scarpa's fascia.  The avascular tissue plane beneath Scarpa's fascia was developed bluntly. The palpable lymph node was identified and isolated using blunt dissection and electrocautery. The lymphatics and small vessels encountered were isolated, clipped and transected. The lymph node was removed and handed off the field. The bed was palpated and not additional enlarged lymph nodes were identified. The surgical bed was irrigated and found to be hemostatic. The subcutaneous tissue was reapproximated with 3-0 vicryl and the skin was closed with a running subcuticular stitch of 4-0 monocryl followed by surgical glue.  A 10 mm incision was made in the left upper quadrant  near Palmer's point.  The abdomen was entered with a 5 mm OptiView trocar under direct visualization.  The abdomen was insufflated, the  patient placed in steep Trendelenburg, and additional trocars were placed as follows: an 8mm trocar superior to the umbilicus, one 8 mm robotic trocar in the right abdomen, and one 8 mm robotic trocar in the left abdomen.  The left upper quadrant trocar was removed and replaced with a 12 mm airseal trocar.  All trocars were placed under direct visualization.  The bowels were moved into the upper abdomen.  The DaVinci robotic surgical system was brought to the patient's bedside and docked.  The peritoneum overlying the right pelvic sidewall was incised and the the right retroperitoneum entered.   The right ureter was identified.  The right infundibulopelvic ligament was isolated, cauterized, and transected.  The remaining attachments of the right adnexa to the right pelvic sidewall were isolated bluntly and transected with electrocautery. Remnant portions of the round and uterine vessels were encountered. The specimen was placed in an endocatch bag.  The same procedure was performed on the contralateral side and the specimen was placed in a separate endocatch bag. The pelvis was irrigated and all operative sites were found to be hemostatic.  All instruments were removed and the robot was taken from the patient's bedside.   The 12mm airseal trocar was removed. The two endocatch bags were removed. The fascia at the 12 mm incision was closed with 0 Vicryl with the assistance of a PMI device. With insufflation still in place, the supraumbilical robotic trocar was removed.  The midline incision was extended cephalad with a scalpel to approximately 6 cm.  The subcutaneous tissue was divided with electrocautery down to the fascia.  The fascial and peritoneal incisions were extended with electrocautery.  The abdomen was desufflated.  An Alexis retractor was placed.  The omentum was elevated out of the incision. An area superior to the transverse colon was incised and the lesser sac was entered.  Careful dissection was  done to remove the omentum from the length of the transverse colon with electrocautery. Additional adhesions of the omentum to the cecum and the sigmoid colon were encountered and lysed with blunt dissection and electrocautery. Pedicles were made in the omentum superior to the transverse colon but caudad to the short gastrics. These were cauterized and transected with Ligasure cautery.  The abdomen was irrigated and hemostasis was noted at all pedicles.   At the midline incision fascia was closed with a running stitch #1 PDS. Exparel  was injected at the incision sites in standard fashion. The subcutaneous tissues were irrigated and hemostasis achieved. The subcutaneous space at the midline was approximated with a running stitch of 2-0 Vicryl. The skin was closed with 4-0 vicryl deep dermal, 4-0 monocryl in a subcuticular fashion and surgical glue. The skin at all laparoscopic incisions was closed with 4-0 Vicryl to reapproximate the subcutaneous tissue and 4-0 monocryl in a subcuticular fashion followed by surgical glue.  Patient tolerated the procedure well. Sponge, lap, and instrument counts were correct.  Patient received 2 gm of Ancef  prior to skin incision for routine perioperative antibiotic prophylaxis.  She was extubated and taken to the PACU in stable condition.  Hoy Masters, MD Gynecologic Oncology

## 2023-12-30 ENCOUNTER — Encounter: Payer: Self-pay | Admitting: Hematology

## 2023-12-30 ENCOUNTER — Other Ambulatory Visit: Payer: Self-pay | Admitting: Gynecologic Oncology

## 2023-12-30 ENCOUNTER — Other Ambulatory Visit (HOSPITAL_COMMUNITY): Payer: Self-pay

## 2023-12-30 ENCOUNTER — Encounter (HOSPITAL_COMMUNITY): Payer: Self-pay | Admitting: Psychiatry

## 2023-12-30 DIAGNOSIS — C569 Malignant neoplasm of unspecified ovary: Secondary | ICD-10-CM

## 2023-12-30 LAB — CBC
HCT: 34.6 % — ABNORMAL LOW (ref 36.0–46.0)
Hemoglobin: 10.7 g/dL — ABNORMAL LOW (ref 12.0–15.0)
MCH: 23.8 pg — ABNORMAL LOW (ref 26.0–34.0)
MCHC: 30.9 g/dL (ref 30.0–36.0)
MCV: 76.9 fL — ABNORMAL LOW (ref 80.0–100.0)
Platelets: 234 K/uL (ref 150–400)
RBC: 4.5 MIL/uL (ref 3.87–5.11)
RDW: 14.6 % (ref 11.5–15.5)
WBC: 9.9 K/uL (ref 4.0–10.5)
nRBC: 0 % (ref 0.0–0.2)

## 2023-12-30 LAB — BASIC METABOLIC PANEL WITH GFR
Anion gap: 8 (ref 5–15)
Anion gap: 12 (ref 5–15)
BUN: 16 mg/dL (ref 8–23)
BUN: 20 mg/dL (ref 8–23)
CO2: 25 mmol/L (ref 22–32)
CO2: 27 mmol/L (ref 22–32)
Calcium: 8.1 mg/dL — ABNORMAL LOW (ref 8.9–10.3)
Calcium: 9.3 mg/dL (ref 8.9–10.3)
Chloride: 98 mmol/L (ref 98–111)
Chloride: 98 mmol/L (ref 98–111)
Creatinine, Ser: 0.89 mg/dL (ref 0.44–1.00)
Creatinine, Ser: 1.25 mg/dL — ABNORMAL HIGH (ref 0.44–1.00)
GFR, Estimated: >60 mL/min (ref >60)
GFR, Estimated: 43 mL/min — ABNORMAL LOW (ref >60)
Glucose, Bld: 124 mg/dL — ABNORMAL HIGH (ref 70–99)
Glucose, Bld: 122 mg/dL — ABNORMAL HIGH (ref 70–99)
Potassium: 3 mmol/L — ABNORMAL LOW (ref 3.5–5.1)
Potassium: 3.6 mmol/L (ref 3.5–5.1)
Sodium: 131 mmol/L — ABNORMAL LOW (ref 135–145)
Sodium: 137 mmol/L (ref 135–145)

## 2023-12-30 LAB — GLUCOSE, CAPILLARY
Glucose-Capillary: 133 mg/dL — ABNORMAL HIGH (ref 70–99)
Glucose-Capillary: 108 mg/dL — ABNORMAL HIGH (ref 70–99)
Glucose-Capillary: 112 mg/dL — ABNORMAL HIGH (ref 70–99)

## 2023-12-30 MED ORDER — SODIUM CHLORIDE 0.9 % IV SOLN: 250.0000 mL | INTRAVENOUS | Status: DC | PRN

## 2023-12-30 MED ORDER — APIXABAN 2.5 MG PO TABS
2.5000 mg | ORAL_TABLET | Freq: Two times a day (BID) | ORAL | 0 refills | Status: DC
Start: 2023-12-31 — End: 2024-02-11
  Filled 2023-12-30: qty 28, 14d supply, fill #0

## 2023-12-30 MED ORDER — POTASSIUM CHLORIDE CRYS ER 20 MEQ PO TBCR
20.0000 meq | EXTENDED_RELEASE_TABLET | Freq: Once | ORAL | Status: AC
  Administered 2023-12-30: 20 meq via ORAL
  Filled 2023-12-30: qty 1

## 2023-12-30 MED ORDER — SODIUM CHLORIDE 0.9% FLUSH: 3.0000 mL | INTRAVENOUS | Status: DC | PRN

## 2023-12-30 MED ORDER — SODIUM CHLORIDE 0.9% FLUSH
3.0000 mL | Freq: Two times a day (BID) | INTRAVENOUS | Status: DC
  Administered 2023-12-30: 3 mL via INTRAVENOUS

## 2023-12-30 NOTE — Discharge Summary (Signed)
 Physician Discharge Summary  Patient ID: Nancy Liu MRN: [id number] DOB/AGE: 08-03-1940 83 y.o.  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    Hospital Course: On 12/29/2023, the patient underwent the following: Procedure(s): ROBOTIC ASSISTED LAPAROSCOPIC BILATERAL SALPINGO-OOPHORECTOMY, MINI-LAPAROTOMY FOR OMENTECTOMY, LEFT INGUINOFEMORAL LYMPH NODE REMOVAL. The postoperative course was uneventful.  She was discharged to home on postoperative day 1 tolerating a regular diet, voiding, pain controlled, with HHPT after PT evaluation while inpt, ambulating with assist.   Consults: Physical therapy  Significant Diagnostic Studies: Labs  Treatments: surgery: see above  Discharge Exam (from am assessment): Blood pressure 104/61, pulse 73, temperature 97.6 F (36.4 C), temperature source Oral, resp. rate 17, SpO2 100%. General: alert, cooperative, no distress, and pleasantly disoriented to time Resp: clear to auscultation bilaterally Cardio: regular rate and rhythm, S1, S2 normal, no murmur, click, rub or gallop GI: soft, non-tender; bowel sounds normal; no masses,  no organomegaly and incision: lap sites to the abdomen with dermabond intact with no active drainage, left inguinal incision with dermabond is intact with no drainage Extremities: extremities normal, atraumatic, no cyanosis or edema  Disposition: Discharge disposition: 06-Home-Health Care Svc       Discharge Instructions     Call MD for:  difficulty breathing, headache or visual disturbances   Complete by: As directed    Call MD for:  extreme fatigue   Complete by: As directed    Call MD for:  hives   Complete by: As directed    Call  MD for:  persistant dizziness or light-headedness   Complete by: As directed    Call MD for:  persistant nausea and vomiting   Complete by: As directed    Call MD for:  redness, tenderness, or signs of infection (pain, swelling, redness, odor or green/yellow discharge around incision site)   Complete by: As directed    Call MD for:  severe uncontrolled pain   Complete by: As directed    Call MD for:  temperature >100.4   Complete by: As directed    Diet - low sodium heart healthy   Complete by: As directed    Discharge wound care:   Complete by: As directed    Keep incision clean and dry   Driving Restrictions   Complete by: As directed    No driving for 4-89 days if you were cleared to drive before surgery and have met the following criteria: Do not take narcotics and drive. You need to make sure your reaction time has returned.   Increase activity slowly   Complete by: As directed    Lifting restrictions   Complete by: As directed    No lifting greater than 10 lbs, pushing, pulling, straining for 6 weeks.   Sexual Activity Restrictions   Complete by: As directed    No sexual activity, nothing in the vagina, for 12 weeks.      Allergies as of 12/30/2023       Reactions   Protonix  [pantoprazole  Sodium] Other (See Comments)   DRY LIPS        Medication List     STOP taking these medications    aspirin  EC 81 MG tablet       TAKE these  medications    amLODipine  10 MG tablet Commonly known as: NORVASC  TAKE ONE TABLET BY MOUTH EVERYDAY AT BEDTIME What changed:  how much to take how to take this when to take this additional instructions   apixaban  2.5 MG Tabs tablet Commonly known as: Eliquis  Take 1 tablet (2.5 mg total) by mouth 2 (two) times daily. Start taking on: December 31, 2023   carvedilol  6.25 MG tablet Commonly known as: COREG  TAKE ONE TABLET BY MOUTH BEFORE BREAKFAST and TAKE ONE TABLET BY MOUTH EVERYDAY AT BEDTIME   cyanocobalamin  1000 MCG  tablet Commonly known as: VITAMIN B12 Take 1 tablet (1,000 mcg total) by mouth daily. What changed: when to take this   docusate sodium  100 MG capsule Commonly known as: Colace Take 1 capsule (100 mg total) by mouth 2 (two) times daily.   donepezil  5 MG tablet Commonly known as: ARICEPT  Take 1 tablet (5 mg total) by mouth at bedtime.   feeding supplement Liqd Take 237 mLs by mouth 3 (three) times daily between meals. What changed: when to take this   ferrous sulfate  325 (65 FE) MG EC tablet Take 1 tablet (325 mg total) by mouth every other day.   irbesartan -hydrochlorothiazide  300-12.5 MG tablet Commonly known as: AVALIDE Take 1 tablet by mouth daily before breakfast.   metFORMIN  500 MG tablet Commonly known as: GLUCOPHAGE  Take 1 tablet (500 mg total) by mouth at bedtime.   mirtazapine  7.5 MG tablet Commonly known as: REMERON  Take 1 tablet (7.5 mg total) by mouth at bedtime. For depression and weight loss.   OneTouch Ultra test strip Generic drug: glucose blood SMARTSIG:Strip(s)   oxyCODONE  5 MG immediate release tablet Commonly known as: Roxicodone  Take 1 tablet (5 mg total) by mouth every 6 (six) hours as needed for severe pain (pain score 7-10). For AFTER surgery only, do not take and drive   rosuvastatin  20 MG tablet Commonly known as: CRESTOR  TAKE ONE TABLET BY MOUTH EVERYDAY AT BEDTIME   senna-docusate 8.6-50 MG tablet Commonly known as: Senokot-S Take 2 tablets by mouth at bedtime. For AFTER surgery, do not take if having diarrhea   triamcinolone ointment 0.5 % Commonly known as: KENALOG Apply 1 application  topically 2 (two) times daily as needed (rash).   Vitamin D3 50 MCG (2000 UT) Tabs TAKE ONE TABLET BY MOUTH daily in addition TO THE 1,000unit FOR total of 3,000units daily What changed: See the new instructions.               Durable Medical Equipment  (From admission, onward)           Start     Ordered   12/30/23 1444  For home use  only DME Walker rolling  Once       Question Answer Comment  Walker: With 5 Inch Wheels   Patient needs a walker to treat with the following condition Difficulty in walking, not elsewhere classified      12/30/23 1444              Discharge Care Instructions  (From admission, onward)           Start     Ordered   12/30/23 0000  Discharge wound care:       Comments: Keep incision clean and dry   12/30/23 1525            Follow-up Information     Eldonna Mays, MD Follow up on 01/18/2024.   Specialty: Gynecologic Oncology Why: at  2pm at the [facility] information: [address] [place] [address] [phone]         [facility] Follow up.   Specialty: Home Health Services Why: Will provide Home Health Physical Therapy. They will call you to set up appointment. Contact information: [address] [place] [address] [phone]         [facility] Follow up.   Why: [facility] delievered the 2 wheel rolling walker to you prior to discharge. If you have any issues with equipment. Please contact [email or web address] [address],  [place], [place] [address]  [phone]                Greater than thirty minutes were spend for face to face discharge instructions and discharge orders/summary in EPIC.   Signed: Eleanor JONETTA Epps 12/30/2023, 3:40 PM

## 2023-12-30 NOTE — TOC Transition Note (Signed)
 Transition of Care [facility]) - Discharge Note   Patient Details  Name: Nancy Liu MRN: [id number] Date of Birth: 06/29/1940  Transition of Care [facility]) CM/SW Contact:  Doneta Glenys DASEN, RN Phone Number: 12/30/2023, 3:46 PM   Clinical Narrative:    CM signing off. Place consult if needs present.   Final next level of care: Home w Home Health Services Barriers to Discharge: Barriers Resolved   Patient Goals and CMS Choice Patient states their goals for this hospitalization and ongoing recovery are:: Home with daughter CMS Medicare.gov Compare Post Acute Care list provided to:: Patient Choice offered to / list presented to : Patient, Adult Children [place] ownership interest in [facility].provided to:: Parent NA    Discharge Placement                       Discharge Plan and Services Additional resources added to the After Visit Summary for   In-house Referral: NA Discharge Planning Services: CM Consult Post Acute Care Choice: Home Health ([facility] - PT)          DME Arranged: Vannie rolling DME Agency: [employer] Date DME Agency Contacted: 12/30/23 Time DME Agency Contacted: 1500 Representative spoke with at DME Agency: [place] HH Arranged: PT HH Agency: [facility] (Also [facility] as [facility]) Date HH Agency Contacted: 12/30/23 Time HH Agency Contacted: 1500 Representative spoke with at [facility] Agency: Amy  Social Drivers of Health (SDOH) Interventions SDOH Screenings   Food Insecurity: No Food Insecurity (12/09/2023)  Housing: Low Risk  (09/04/2023)  Transportation Needs: No Transportation Needs (12/09/2023)  Utilities: Not At Risk (09/04/2023)  Alcohol Screen: Low Risk  (06/04/2021)  Depression (PHQ2-9): Low Risk  (12/16/2023)  Financial Resource Strain: Low Risk  (06/04/2021)  Physical Activity: Inactive (06/04/2021)  Social Connections: Moderately Isolated (09/05/2023)  Stress: Stress Concern Present (06/04/2021)  Tobacco Use: Medium Risk  (12/24/2023)     Readmission Risk Interventions    12/30/2023    2:53 PM  Readmission Risk Prevention Plan  Transportation Screening Complete  PCP or Specialist Appt within 5-7 Days Complete  Medication Review (RN CM) Complete

## 2023-12-30 NOTE — TOC Initial Note (Signed)
 Transition of Care [facility]) - Initial/Assessment Note    Patient Details  Name: Nancy Liu MRN: [id number] Date of Birth: 1941-02-25  Transition of Care [facility]) CM/SW Contact:    Doneta Glenys DASEN, RN Phone Number: 12/30/2023, 3:09 PM  Clinical Narrative:                 Presented for female surgery. CM meet with patient and daughter Lila Kitty [phone] in the room. Roslyn states that patient lives in a house with [name] patients daughter. Verified PCP/insurance; DME- 4 prong cane; No HH, oxygen  or SDOH needs; Roslyn states will transport home at discharge.  Patient agreeable to [facility] PT and a rolling walker. [facility] will provide HH PT and [facility] will deliver rolling walker to patients room prior to discharge. Information added to AVS. Patient and Roslyn informed.  Expected Discharge Plan: Home w Home Health Services Barriers to Discharge: Continued Medical Work up   Patient Goals and CMS Choice Patient states their goals for this hospitalization and ongoing recovery are:: Home with daughter CMS Medicare.gov Compare Post Acute Care list provided to:: Patient Choice offered to / list presented to : Patient, Adult Children [place] ownership interest in [facility].provided to:: Parent NA    Expected Discharge Plan and Services In-house Referral: NA Discharge Planning Services: CM Consult Post Acute Care Choice: Home Health ([facility] - PT) Living arrangements for the past 2 months: Single Family Home                 DME Arranged: Walker rolling DME Agency: [employer] Date DME Agency Contacted: 12/30/23 Time DME Agency Contacted: 1500 Representative spoke with at DME Agency: [place] HH Arranged: PT HH Agency: [facility] ([facility] Date HH Agency Contacted: 12/30/23 Time HH Agency Contacted: 1500 Representative spoke with at [facility] Agency: Amy  Prior Living Arrangements/Services Living arrangements for the past 2 months:  Single Family Home Lives with:: Adult Children Patient language and need for interpreter reviewed:: Yes Do you feel safe going back to the place where you live?: Yes      Need for Family Participation in Patient Care: Yes (Comment) Care giver support system in place?: Yes (comment) Current home services: DME, Home PT (4-prong cane - [employer] 2 wheel rolling walker) Criminal Activity/Legal Involvement Pertinent to Current Situation/Hospitalization: No - Comment as needed  Activities of Daily Living      Permission Sought/Granted Permission sought to share information with : Case Manager Permission granted to share information with : Yes, Verbal Permission Granted  Share Information with NAME: bunnie arland (Daughter)  [phone], Arma Kitty (dlt) [phone]  Permission granted to share info w AGENCY: [facility] and [facility]        Emotional Assessment Appearance:: Appears stated age Attitude/Demeanor/Rapport: Other (comment) (Rest with eyes closed allow daughter to answer questions) Affect (typically observed): Quiet Orientation: : Oriented to Self, Oriented to Place, Oriented to  Time, Oriented to Situation Alcohol / Substance Use: Not Applicable Psych Involvement: No (comment)  Admission diagnosis:  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 (HCC) [C76.3] Ovarian cancer (HCC) [C56.9] Patient Active Problem List   Diagnosis Date Noted   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 (HCC) 12/29/2023   Ovarian cancer (HCC) 12/29/2023   High grade serous carcinoma (HCC) 11/13/2023   Vitamin B12 deficiency 11/13/2023   Inguinal lymphadenopathy 10/21/2023   Hypokalemia 09/05/2023   Neurocognitive disorder 09/05/2023   Hyponatremia 09/05/2023   AKI (acute kidney injury) (HCC) 09/04/2023   Failure to thrive  in adult 09/04/2023   Facial asymmetry 07/01/2023   Chronic kidney disease, stage IIIb 07/01/2023   Acute metabolic encephalopathy 06/30/2023   DOE (dyspnea on exertion) 07/03/2022    Snoring 04/18/2022   GERD (gastroesophageal reflux disease) 03/05/2021   S/P mastectomy, right 02/20/2021   Genetic testing 02/12/2021   Family history of breast cancer 01/23/2021   Family history of ovarian cancer 01/23/2021   Ductal carcinoma in situ (DCIS) of right breast 01/17/2021   Non-insulin  dependent type 2 diabetes mellitus (HCC) 03/26/2020   Loss of weight 07/13/2019   Constipation 02/10/2017   AVM (arteriovenous malformation) of small bowel, acquired 02/10/2017   Iron  deficiency anemia due to chronic blood loss 07/23/2016   Normocytic anemia 02/29/2016   Weight loss 04/01/2012   Chest pain on exertion 02/07/2011   Microcytic anemia 01/31/2010   Cerebrovascular disease 01/31/2010   CAD S/P percutaneous coronary angioplasty 01/03/2009   Mixed hyperlipidemia 10/22/2007   Essential hypertension 10/22/2007   Cough 10/22/2007   PCP:  Benjamine Aland, MD Pharmacy:   [facility] [place] [place] [address] [place] [address] Phone: [phone] Fax: [phone]  [facility] [place], [place] - [address] [place] [address] Phone: [phone] Fax: [phone]     Social Drivers of Health (SDOH) Social History: SDOH Screenings   Food Insecurity: No Food Insecurity (12/09/2023)  Housing: Low Risk  (09/04/2023)  Transportation Needs: No Transportation Needs (12/09/2023)  Utilities: Not At Risk (09/04/2023)  Alcohol Screen: Low Risk  (06/04/2021)  Depression (PHQ2-9): Low Risk  (12/16/2023)  Financial Resource Strain: Low Risk  (06/04/2021)  Physical Activity: Inactive (06/04/2021)  Social Connections: Moderately Isolated (09/05/2023)  Stress: Stress Concern Present (06/04/2021)  Tobacco Use: Medium Risk (12/24/2023)   SDOH Interventions:     Readmission Risk Interventions    12/30/2023    2:53 PM  Readmission Risk Prevention Plan  Transportation Screening Complete  PCP or Specialist  Appt within 5-7 Days Complete  Medication Review (RN CM) Complete

## 2023-12-30 NOTE — Progress Notes (Signed)
 1 Day Post-Op Procedure(s) (LRB): ROBOTIC ASSISTED LAPAROSCOPIC BILATERAL SALPINGO-OOPHORECTOMY, MINI-LAPAROTOMY FOR OMENTECTOMY (Bilateral) LEFT INGUINOFEMORAL LYMPH NODE REMOVAL (Left)  Subjective: Patient reports doing well this am. Tolerated regular food for breakfast. Has not been out of bed. Daughter requesting assist with mobility and asking if PT would be warranted. Has not started passing flatus. Having abdominal soreness and no significant pain. Daughter at the bedside.  Objective: Vital signs in last 24 hours: Temp:  [97.4 F (36.3 C)-98.6 F (37 C)] 97.6 F (36.4 C) (07/30 0314) Pulse Rate:  [67-92] 80 (07/30 0314) Resp:  [11-19] 18 (07/30 0314) BP: (122-141)/(64-75) 141/74 (07/30 0816) SpO2:  [98 %-100 %] 100 % (07/30 0314)    Intake/Output from previous day: 07/29 0701 - 07/30 0700 In: 1941.7 [I.V.:1341.7; IV Piggyback:600] Out: 1045 [Urine:975; Blood:50]  Physical Examination: General: alert, cooperative, no distress, and pleasantly disoriented to time Resp: clear to auscultation bilaterally Cardio: regular rate and rhythm, S1, S2 normal, no murmur, click, rub or gallop GI: soft, non-tender; bowel sounds normal; no masses,  no organomegaly and incision: lap sites to the abdomen with dermabond intact with no active drainage, left inguinal incision with dermabond is intact with no drainage Extremities: extremities normal, atraumatic, no cyanosis or edema  Labs: WBC/Hgb/Hct/Plts:  9.9/10.7/34.6/234 (07/30 0451) BUN/Cr/glu/ALT/AST/amyl/lip:  16/0.89/--/--/--/--/-- (07/30 0451)  Assessment: 83 y.o. s/p Procedure(s): ROBOTIC ASSISTED LAPAROSCOPIC BILATERAL SALPINGO-OOPHORECTOMY, MINI-LAPAROTOMY FOR OMENTECTOMY LEFT INGUINOFEMORAL LYMPH NODE REMOVAL: stable Pain:  Pain is well-controlled on PRN medications.  Heme: Hgb 10.7 and Hct 34.6 this am. Appropriate given preop values and surgical losses.   ID: WBC 9.9 this am. Afebrile. Given decadron  and intra-op  ancef   CV: BP and HR stable. Continue to monitor while inpatient.  GI:  Tolerating po: yes. Antiemetics ordered prn.   GU: Creatinine 0.89. No output documented. Voiding per pt.    FEN: No critical values on am labs. Na+ 131 and K+ 3.0 this am-plan for one dose of K+ replacement.  Endo: Diabetes mellitus Type II, under good control.  CBG: CBG (last 3)  Recent Labs    12/29/23 1739 12/29/23 2109 12/30/23 0742  GLUCAP 122* 156* 133*     Prophylaxis: lovenox  and SCDs ordered.  Plan: Mobility specialist to assist with ambulation  If challenges, can order PT eval Saline lock IV Kdur replacement Plan for repeat Bmet later this morning to re-eval  Plan for discharge home later today if meeting milestones   LOS: 1 day    Nancy Liu 12/30/2023, 10:02 AM

## 2023-12-30 NOTE — Evaluation (Signed)
 Physical Therapy Evaluation Patient Details Name: Nancy Liu MRN: [id number] DOB: 08/02/1940 Today's Date: 12/30/2023  History of Present Illness  83 y.o. 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 PMH: DM, breast cancer, COPD.  Clinical Impression  Pt admitted with above diagnosis. Pt ambulated 47' with RW and min A for steering RW and for balance. Pt walks with a quad cane at baseline. Family is able to provide 24/7 assistance at home.  Pt currently with functional limitations due to the deficits listed below (see PT Problem List). Pt will benefit from acute skilled PT to increase their independence and safety with mobility to allow discharge.           If plan is discharge home, recommend the following: A little help with walking and/or transfers;A little help with bathing/dressing/bathroom;Assistance with cooking/housework;Assist for transportation;Help with stairs or ramp for entrance   Can travel by private vehicle        Equipment Recommendations Rolling walker (2 wheels)  Recommendations for Other Services       Functional Status Assessment Patient has had a recent decline in their functional status and demonstrates the ability to make significant improvements in function in a reasonable and predictable amount of time.     Precautions / Restrictions Precautions Precautions: Fall Recall of Precautions/Restrictions: Intact Precaution/Restrictions Comments: abdominal surgery; instructed pt/daughter in log roll technique Restrictions Weight Bearing Restrictions Per Provider Order: No      Mobility  Bed Mobility Overal bed mobility: Needs Assistance Bed Mobility: Rolling, Sidelying to Sit Rolling: Mod assist Sidelying to sit: Mod assist       General bed mobility comments: assist  to roll, VCs log roll technique, mod A to raise trunk    Transfers Overall transfer level: Needs assistance Equipment used: Rolling walker (2 wheels) Transfers: Sit to/from Stand Sit to Stand: Min assist           General transfer comment: VCs hand placement    Ambulation/Gait Ambulation/Gait assistance: Min assist Gait Distance (Feet): 60 Feet Assistive device: Rolling walker (2 wheels) Gait Pattern/deviations: Step-through pattern, Decreased stride length Gait velocity: decr     General Gait Details: min A to steer RW, VCs for proximity to RW, at times had shuffling gait  Stairs            Wheelchair Mobility     Tilt Bed    Modified Rankin (Stroke Patients Only)       Balance Overall balance assessment: Needs assistance, History of Falls Sitting-balance support: Feet supported, No upper extremity supported Sitting balance-Leahy Scale: Good     Standing balance support: Bilateral upper extremity supported, During functional activity, Reliant on assistive device for balance Standing balance-Leahy Scale: Fair                               Pertinent Vitals/Pain Pain Assessment Pain Assessment: 0-10 Pain Score: 4  Pain Location: abdomen Pain Descriptors / Indicators: Sore, Grimacing Pain Intervention(s): Limited activity within patient's tolerance, Monitored during session, Repositioned, Premedicated before session    Home Living Family/patient expects to be discharged to:: Private residence Living Arrangements: Children Available Help at Discharge: Family;Available 24 hours/day Type of Home: House Home Access: Stairs to enter Entrance Stairs-Rails: None Entrance Stairs-Number of Steps: 3   Home Layout: One level Home Equipment: Cane - quad  Additional Comments: lives with daughter who works; family can provide 24/7 assist    Prior Function Prior Level of Function : Needs assist       Physical Assist : ADLs (physical)      Mobility Comments: independent with quad cane, 1 fall in past 6 months ADLs Comments: some help for bathing/dressing     Extremity/Trunk Assessment   Upper Extremity Assessment Upper Extremity Assessment: Defer to OT evaluation    Lower Extremity Assessment Lower Extremity Assessment: Overall WFL for tasks assessed    Cervical / Trunk Assessment Cervical / Trunk Assessment: Normal  Communication   Communication Communication: No apparent difficulties    Cognition Arousal: Alert Behavior During Therapy: WFL for tasks assessed/performed   PT - Cognitive impairments: Memory                       PT - Cognition Comments: daughter provided home and PLOF info Following commands: Intact       Cueing Cueing Techniques: Verbal cues, Tactile cues     General Comments      Exercises     Assessment/Plan    PT Assessment Patient needs continued PT services  PT Problem List Decreased activity tolerance;Decreased mobility;Pain;Decreased balance       PT Treatment Interventions Gait training;Therapeutic exercise;Patient/family education    PT Goals (Current goals can be found in the Care Plan section)  Acute Rehab PT Goals Patient Stated Goal: get stronger; used to like to bowl and play baseball PT Goal Formulation: With patient/family Time For Goal Achievement: 01/13/24 Potential to Achieve Goals: Good    Frequency Min 3X/week     Co-evaluation               AM-PAC PT 6 Clicks Mobility  Outcome Measure Help needed turning from your back to your side while in a flat bed without using bedrails?: A Little Help needed moving from lying on your back to sitting on the side of a flat bed without using bedrails?: A Little Help needed moving to and from a bed to a chair (including a wheelchair)?: A Little Help needed standing up from a chair using your arms (e.g., wheelchair or bedside chair)?: A Little Help needed to walk in hospital room?: A  Little Help needed climbing 3-5 steps with a railing? : A Little 6 Click Score: 18    End of Session Equipment Utilized During Treatment: Gait belt Activity Tolerance: Patient tolerated treatment well Patient left: in chair;with call bell/phone within reach;with family/visitor present Nurse Communication: Mobility status PT Visit Diagnosis: Difficulty in walking, not elsewhere classified (R26.2);Pain    Time: 8596-8573 PT Time Calculation (min) (ACUTE ONLY): 23 min   Charges:   PT Evaluation $PT Eval Moderate Complexity: 1 Mod PT Treatments $Gait Training: 8-22 mins PT General Charges $$ ACUTE PT VISIT: 1 Visit         Sylvan Delon Copp PT 12/30/2023  Acute Rehabilitation Services  Office [phone]

## 2023-12-30 NOTE — Progress Notes (Signed)
 RN read and reviewed discharge instructions to pt daughter and pt. Pt's daughter verbalized understanding. Pt escorted via wheelchair to main entrance to family members personal vehicle.

## 2023-12-31 ENCOUNTER — Telehealth: Payer: Self-pay

## 2023-12-31 ENCOUNTER — Encounter: Payer: Self-pay | Admitting: Gastroenterology

## 2023-12-31 ENCOUNTER — Telehealth: Payer: Self-pay | Admitting: *Deleted

## 2023-12-31 DIAGNOSIS — C763 Malignant neoplasm of pelvis: Secondary | ICD-10-CM | POA: Diagnosis not present

## 2023-12-31 DIAGNOSIS — C569 Malignant neoplasm of unspecified ovary: Secondary | ICD-10-CM | POA: Diagnosis not present

## 2023-12-31 NOTE — Transitions of Care (Post Inpatient/ED Visit) (Signed)
   12/31/2023  Name: UMA JERDE MRN: [id number] DOB: May 07, 1941  Today's TOC FU Call Status: Today's TOC FU Call Status:: Unsuccessful Call (1st Attempt) Unsuccessful Call (1st Attempt) Date: 12/31/23  Attempted to reach the patient regarding the most recent Inpatient/ED visit. Left a HIPAA approved voicemail message to phone number provided in demographics per DPR.    Follow Up Plan: Additional outreach attempts will be made to reach the patient to complete the Transitions of Care (Post Inpatient/ED visit) call.   Richerd Fish, RN, BSN, CCM [facility], [facility] RN Care Manager Direct Dial: [phone]

## 2023-12-31 NOTE — Telephone Encounter (Signed)
-----   Message from Eleanor JONETTA Epps sent at 12/30/2023  3:40 PM EDT ----- [place]. Thank you. I added this to her hospital instructions but we know that is on page 8 :-) Please review with patient/daughter tomorrow with post-op call. Think pt has memory issues so would aim to talk with daughter after. ----- Message ----- From: [place], Kimberly, CMA Sent: 12/30/2023   3:33 PM EDT To: Ami LITTIE Fess, RN; Eleanor JONETTA Epps, NP  Per [name], pt just needs to go to entrance of hospital and register. They will send her to lab after she registers. ----- Message ----- From: Epps Eleanor JONETTA, NP Sent: 12/30/2023   3:28 PM EDT To: Ami LITTIE Fess, RN; Kimberly [place], CMA; Ap#  We are planning on discharging this patient this afternoon.  She will need to have repeat labs the beginning of next week.  She lives in [place] so please see about getting her a lab appointment if possible at the [facility] for Monday.  I will place labs.  If you are able to figure this out in the next visit I will add to her discharge information thank you

## 2023-12-31 NOTE — Telephone Encounter (Addendum)
 Spoke with Nancy Liu and her daughter's Nancy Liu this morning and this afternoon. She states she is eating, drinking and urinating well. She has not had a BM yet but is passing gas. She is taking senokot as prescribed and encouraged her to drink plenty of water . She denies fever or chills. Incisions are dry and intact. She rates her pain 3/10. Her pain is controlled with tylenol  only.  Relayed message from Nancy Epps, NP   -that Nancy Liu will need repeat labs beginning of next week. Nancy Liu can go to [facility] on Monday between 700am-4:30pm and check in at the main entrance and they will send her to lab once she registers.   Instructed to call office with any fever, chills, purulent drainage, uncontrolled pain or any other questions or concerns. Patient verbalizes understanding.   Nancy Liu aware of post op appointments as well as the office number [phone] and after hours number [phone] to call if she has any questions or concerns

## 2023-12-31 NOTE — Telephone Encounter (Signed)
 Attempted to reach patient for post op call. Left voicemail requesting call back.

## 2024-01-01 ENCOUNTER — Other Ambulatory Visit (HOSPITAL_BASED_OUTPATIENT_CLINIC_OR_DEPARTMENT_OTHER): Payer: Self-pay

## 2024-01-01 ENCOUNTER — Telehealth: Payer: Self-pay

## 2024-01-01 NOTE — Patient Instructions (Signed)
 Visit Information  Thank you for taking time to visit with me today. Please don't hesitate to contact me if I can be of assistance to you before our next scheduled telephone appointment.  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  Goal:  Over the next 30 days, the patient will not experience hospital readmission  Interventions:   Surgery (Serous Carcinoma of femal pelvis): Evaluation of current treatment plan related to abdominal surgery assessed patient/caregiver understanding of surgical procedure   reviewed post-operative instructions with patient/caregiver addressed questions about post - surgical incision care  reviewed medications with patient and addressed questions reviewed scheduled provider appointments with patient/daughter [name] confirmed availability of transportation to all appointments with daughter [name], has several   Patient Self Care Activities:  Attend all scheduled provider appointments Call pharmacy for medication refills 3-7 days in advance of running out of medications Call provider office for new concerns or questions  Notify RN Care Manager of [facility] call rescheduling needs Participate in Transition of Care Program/Attend TOC scheduled calls Take medications as prescribed    Plan:  The care management team will reach out to the patient again over the next 5-10 business  days. The patient has been provided with contact information for the care management team and  has been advised to call with any health related questions or concerns.         Patient verbalizes understanding of instructions and care plan provided today and agrees to view in MyChart. Active MyChart status and patient understanding of how to access instructions and care plan via MyChart confirmed with patient.     The patient has been provided with contact information for the care management team and has been advised to call with any health related questions or concerns.   Please call the care guide team at [phone] if you need to cancel or reschedule your appointment.   Please call the [place]  National Suicide Prevention Lifeline: 830-569-7651 or TTY: (864)326-7084 TTY [phone]) to talk to a trained counselor if you are experiencing a Mental Health or Behavioral Health Crisis or need someone to talk to.  Richerd Fish, RN, BSN, CCM [facility], [facility] Health RN Care Manager Direct Dial: [phone]

## 2024-01-01 NOTE — Transitions of Care (Post Inpatient/ED Visit) (Signed)
 01/01/2024  Name: Nancy Liu MRN: [id number] DOB: 1941-03-25  Today's TOC FU Call Status: Today's TOC FU Call Status:: Successful TOC FU Call Completed TOC FU Call Complete Date: 01/01/24 Patient's Name and Date of Birth confirmed.  Transition Care Management Follow-up Telephone Call Date of Discharge: 12/30/23 Discharge Facility: [facility]) Type of Discharge: Inpatient Admission Primary Inpatient Discharge Diagnosis:: serous carcinoma of female pelvis How have you been since you were released from the hospital?: Same  Items Reviewed: Medications obtained,verified, and reconciled?: Yes (Medications Reviewed) Any new allergies since your discharge?: No Dietary orders reviewed?: Yes Type of Diet Ordered:: low sodium heart healthy Do you have support at home?: Yes People in Home [RPT]: child(ren), adult Name of Support/Comfort Primary Source: [name]  Medications Reviewed Today: Medications Reviewed Today     Reviewed by Eilleen Richerd GRADE, RN (Registered Nurse) on 01/01/24 at 1623  Med List Status: <None>   Medication Order Taking? Sig Documenting Provider Last Dose Status Informant  amLODipine  (NORVASC ) 10 MG tablet [id number] Yes TAKE ONE TABLET BY MOUTH EVERYDAY AT BEDTIME Alvan Dorn FALCON, MD  Active Family Member  apixaban  (ELIQUIS ) 2.5 MG TABS tablet [id number] Yes Take 1 tablet (2.5 mg total) by mouth 2 (two) times daily. Micheline Setter D, NP  Active   carvedilol  (COREG ) 6.25 MG tablet [id number] Yes TAKE ONE TABLET BY MOUTH BEFORE BREAKFAST and TAKE ONE TABLET BY MOUTH EVERYDAY AT BEDTIME Ricky Fines, MD  Active Family Member  Cholecalciferol (VITAMIN D3) 50 MCG (2000 UT) TABS [id number]  TAKE ONE TABLET BY MOUTH daily in addition TO THE 1,000unit FOR total of 3,000units daily  Patient taking differently: Take 2,000 Units by mouth every other day.   Lamon Pleasant CHRISTELLA, PA-C  Active Family Member  cyanocobalamin  (VITAMIN B12) 1000 MCG tablet  [id number]  Take 1 tablet (1,000 mcg total) by mouth daily.  Patient taking differently: Take 1,000 mcg by mouth every other day.   Davonna Siad, MD  Active Family Member  docusate sodium  (COLACE) 100 MG capsule [id number] Yes Take 1 capsule (100 mg total) by mouth 2 (two) times daily. Pappayliou, Dorothyann LABOR, DO  Active Family Member           Med Note ALYNE, SUZEN ORN   Tue Dec 29, 2023 10:51 AM) Not started   donepezil  (ARICEPT ) 5 MG tablet [id number]  Take 1 tablet (5 mg total) by mouth at bedtime.  Patient not taking: Reported on 01/01/2024   [name], DO  Active Family Member  feeding supplement (ENSURE ENLIVE / ENSURE PLUS) LIQD [id number] Yes Take 237 mLs by mouth 3 (three) times daily between meals. Maree Bracken D, DO  Active Family Member  ferrous sulfate  325 (65 FE) MG EC tablet [id number]  Take 1 tablet (325 mg total) by mouth every other day.  Patient not taking: Reported on 01/01/2024   Davonna Siad, MD  Active Family Member  irbesartan -hydrochlorothiazide  (AVALIDE) 300-12.5 MG tablet [id number] Yes Take 1 tablet by mouth daily before breakfast. Maree Bracken D, DO  Active Family Member  metFORMIN  (GLUCOPHAGE ) 500 MG tablet [id number] Yes Take 1 tablet (500 mg total) by mouth at bedtime. Maree, Pratik D, DO  Active Family Member  mirtazapine  (REMERON ) 7.5 MG tablet [id number] Yes Take 1 tablet (7.5 mg total) by mouth at bedtime. For depression and weight loss. Lamon Pleasant CHRISTELLA, PA-C  Active Family Member  AISHA FLING test strip [id number] Yes SMARTSIG:Strip(s) [provider]  Active Family Member  oxyCODONE  (ROXICODONE )  5 MG immediate release tablet [id number] Yes Take 1 tablet (5 mg total) by mouth every 6 (six) hours as needed for severe pain (pain score 7-10). For AFTER surgery only, do not take and drive Micheline Eleanor BIRCH, NP  Active Family Member           Med Note ALYNE SUZEN ORN   Tue Dec 29, 2023 10:53 AM) Not started  rosuvastatin  (CRESTOR ) 20 MG tablet  [id number] Yes TAKE ONE TABLET BY MOUTH EVERYDAY AT BEDTIME Alvan Dorn FALCON, MD  Active Family Member  senna-docusate (SENOKOT-S) 8.6-50 MG tablet [id number] Yes Take 2 tablets by mouth at bedtime. For AFTER surgery, do not take if having diarrhea Micheline Eleanor D, NP  Active Family Member           Med Note ALYNE, SUZEN ORN   Tue Dec 29, 2023 10:53 AM) Not started  triamcinolone ointment (KENALOG) 0.5 % [id number] Yes Apply 1 application  topically 2 (two) times daily as needed (rash). [provider]  Active Family Member           Med Note JERALYN DUNCANS A   Tue Oct 13, 2023 11:21 AM)    Med List Note Lesly, Richerd GRADE, [place] 01/01/24 1623): 01/01/24 spoke with daughter [name] and reviewed via AVS from hospital and updated   Pt's son Loman helps with meds [phone]            Home Care and Equipment/Supplies: Were Home Health Services Ordered?: Yes Name of Home Health Agency:: [facility] Has Agency set up a time to come to your home?: No EMR reviewed for Home Health Orders: Orders present/patient has not received call (refer to CM for follow-up) (Start of care is for Monday January 04, 2024) Any new equipment or medical supplies ordered?: Yes Name of Medical supply agency?: [employer] Were you able to get the equipment/medical supplies?: Yes Do you have any questions related to the use of the equipment/supplies?: No  Functional Questionnaire: Do you need assistance with bathing/showering or dressing?: Yes Do you need assistance with meal preparation?: Yes Do you need assistance with eating?: No Do you have difficulty maintaining continence: No Do you need assistance with getting out of bed/getting out of a chair/moving?: Yes (a little per daughter - therapy will help us  know how to manage better when they start) Do you have difficulty managing or taking your medications?: No (uses pill box)  Follow up appointments reviewed: PCP Follow-up appointment  confirmed?: No (Daughter states she will make appointment with PCP) Specialist Hospital Follow-up appointment confirmed?: Yes Date of Specialist follow-up appointment?: 01/18/24 Follow-Up Specialty Provider:: [facility] Do you need transportation to your follow-up appointment?: No Do you understand care options if your condition(s) worsen?: Yes-patient verbalized understanding  SDOH Interventions Today    Flowsheet Row Most Recent Value  SDOH Interventions   Food Insecurity Interventions Intervention Not Indicated  Housing Interventions Intervention Not Indicated  Transportation Interventions Intervention Not Indicated  Utilities Interventions Intervention Not Indicated    Goals Addressed             This Visit's Progress    VBCI Transitions of Care (TOC) Care Plan       Problems:  Recent Hospitalization for treatment of 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  Goal:  Over the next 30 days, the patient will not experience hospital readmission  Interventions:   Surgery (Serous Carcinoma of femal pelvis): Evaluation of current treatment plan related to abdominal surgery assessed patient/caregiver understanding of surgical procedure   reviewed post-operative instructions with patient/caregiver addressed questions about post - surgical incision care  reviewed medications with patient and addressed questions reviewed scheduled provider appointments with patient/daughter [name] confirmed availability of transportation to all appointments with daughter [name], has several   Patient Self Care Activities:  Attend all scheduled provider appointments Call pharmacy for medication refills 3-7 days in advance of running out of medications Call  provider office for new concerns or questions  Notify RN Care Manager of [facility] call rescheduling needs Participate in Transition of Care Program/Attend TOC scheduled calls Take medications as prescribed    Plan:  The care management team will reach out to the patient again over the next 5-10 business  days. The patient has been provided with contact information for the care management team and has been advised to call with any health related questions or concerns.          Richerd Fish, RN, BSN, CCM [facility], [facility] RN Care Manager Direct Dial: [phone]

## 2024-01-04 ENCOUNTER — Other Ambulatory Visit: Payer: Self-pay | Admitting: Gynecologic Oncology

## 2024-01-04 ENCOUNTER — Other Ambulatory Visit (HOSPITAL_COMMUNITY)
Admission: RE | Admit: 2024-01-04 | Discharge: 2024-01-04 | Disposition: A | Discharge status: Home / Self Care | Source: Ambulatory Visit | Attending: Gynecologic Oncology | Admitting: Gynecologic Oncology

## 2024-01-04 ENCOUNTER — Ambulatory Visit: Admitting: Gynecologic Oncology

## 2024-01-04 ENCOUNTER — Telehealth: Payer: Self-pay | Admitting: *Deleted

## 2024-01-04 DIAGNOSIS — E871 Hypo-osmolality and hyponatremia: Secondary | ICD-10-CM

## 2024-01-04 DIAGNOSIS — C569 Malignant neoplasm of unspecified ovary: Secondary | ICD-10-CM | POA: Insufficient documentation

## 2024-01-04 DIAGNOSIS — N1831 Chronic kidney disease, stage 3a: Secondary | ICD-10-CM

## 2024-01-04 DIAGNOSIS — E876 Hypokalemia: Secondary | ICD-10-CM

## 2024-01-04 DIAGNOSIS — Z7901 Long term (current) use of anticoagulants: Secondary | ICD-10-CM

## 2024-01-04 LAB — CBC
HCT: 33 % — ABNORMAL LOW (ref 36.0–46.0)
Hemoglobin: 10.6 g/dL — ABNORMAL LOW (ref 12.0–15.0)
MCH: 24.8 pg — ABNORMAL LOW (ref 26.0–34.0)
MCHC: 32.1 g/dL (ref 30.0–36.0)
MCV: 77.1 fL — ABNORMAL LOW (ref 80.0–100.0)
Platelets: 310 K/uL (ref 150–400)
RBC: 4.28 MIL/uL (ref 3.87–5.11)
RDW: 15 % (ref 11.5–15.5)
WBC: 7.6 K/uL (ref 4.0–10.5)
nRBC: 0 % (ref 0.0–0.2)

## 2024-01-04 LAB — BASIC METABOLIC PANEL WITH GFR
Anion gap: 13 (ref 5–15)
BUN: 49 mg/dL — ABNORMAL HIGH (ref 8–23)
CO2: 28 mmol/L (ref 22–32)
Calcium: 8.6 mg/dL — ABNORMAL LOW (ref 8.9–10.3)
Chloride: 92 mmol/L — ABNORMAL LOW (ref 98–111)
Creatinine, Ser: 1.4 mg/dL — ABNORMAL HIGH (ref 0.44–1.00)
GFR, Estimated: 38 mL/min — ABNORMAL LOW (ref >60)
Glucose, Bld: 105 mg/dL — ABNORMAL HIGH (ref 70–99)
Potassium: 3.3 mmol/L — ABNORMAL LOW (ref 3.5–5.1)
Sodium: 133 mmol/L — ABNORMAL LOW (ref 135–145)

## 2024-01-04 NOTE — Telephone Encounter (Signed)
-----   Message from Nancy Liu sent at 01/04/2024  4:21 PM EDT ----- Please call patient with recent lab results.   Her CBC is overall stable with values similar to where they were on 7/30. Since on blood thinner, any sign of bleeding??  On Cmet:  Sodium is slightly low at 133. Potassium is slightly low at 3.3. Adjust dietary intake but do not go excessive given kidneys.  KIDNEY FUNCTION IS THE HIGHEST IT HAS BEEN IN OUR SYSTEM OVER THE PAST 4 MONTHS. IS SHE STAYING HYDRATED?? NEEDS TO DRINK LIQUIDS BUT NOT JUST WATER .  Please fax results to PCP as well.   We prob need to recheck her labs at the end of this week to make sure things are not continuing to elevate.

## 2024-01-04 NOTE — Telephone Encounter (Signed)
-----   Message from Eleanor JONETTA Epps sent at 01/04/2024  4:21 PM EDT ----- Please call patient with recent lab results.   Her CBC is overall stable with values similar to where they were on 7/30. Since on blood thinner, any sign of bleeding??  On Cmet:  Sodium is slightly low at 133. Potassium is slightly low at 3.3. Adjust dietary intake but do not go excessive given kidneys.  KIDNEY FUNCTION IS THE HIGHEST IT HAS BEEN IN OUR SYSTEM OVER THE PAST 4 MONTHS. IS SHE STAYING HYDRATED?? NEEDS TO DRINK LIQUIDS BUT NOT JUST WATER .  Please fax results to PCP as well.   We prob need to recheck her labs at the end of this week to make sure things are not continuing to elevate.

## 2024-01-04 NOTE — Telephone Encounter (Signed)
 Attempted to reach patient to relay results. Left voicemail requesting call back.

## 2024-01-04 NOTE — Telephone Encounter (Signed)
 Spoke to patient's daughter Nancy Liu and relayed message from Nancy Epps, NP that patient's CBC is overall stable with values similar to where they were on 7/30.  Nancy Liu states her mother is not having any bleeding.   Pt's sodium is slightly low at 133. And potassium is slightly low at 3.3. Adjust dietary intake but don't go excessive given kidney function. Pt can try adding sweet potatoes, raisons and leafy greens to her diet and drinks like power aid and adding liquid IV powder to her water .   Kidney function is the highest it has been in our system over the past 4 months. Nancy Liu states her mother drinks a lot of water . Advised to mix it up with juice, and a power aid drink to her diet and alternate with electrolyte powder to her water .  Nancy Liu verbalized understanding and advised we will recheck Nancy Liu's labs again on Thursday. Nancy Liu agreed to bring patient to lab at [facility].   Results faxed to patient's PCP Nancy Leech, MD fax#[phone].

## 2024-01-05 ENCOUNTER — Encounter: Payer: Self-pay | Admitting: Hematology

## 2024-01-07 ENCOUNTER — Other Ambulatory Visit: Payer: Self-pay

## 2024-01-07 ENCOUNTER — Telehealth: Payer: Self-pay

## 2024-01-07 ENCOUNTER — Other Ambulatory Visit (HOSPITAL_COMMUNITY)
Admission: RE | Admit: 2024-01-07 | Discharge: 2024-01-07 | Disposition: A | Discharge status: Home / Self Care | Source: Ambulatory Visit | Attending: Gynecologic Oncology | Admitting: Gynecologic Oncology

## 2024-01-07 DIAGNOSIS — N1831 Chronic kidney disease, stage 3a: Secondary | ICD-10-CM | POA: Diagnosis not present

## 2024-01-07 DIAGNOSIS — E871 Hypo-osmolality and hyponatremia: Secondary | ICD-10-CM | POA: Diagnosis not present

## 2024-01-07 DIAGNOSIS — Z7901 Long term (current) use of anticoagulants: Secondary | ICD-10-CM | POA: Insufficient documentation

## 2024-01-07 DIAGNOSIS — E876 Hypokalemia: Secondary | ICD-10-CM | POA: Insufficient documentation

## 2024-01-07 LAB — CBC
HCT: 36.5 % (ref 36.0–46.0)
Hemoglobin: 11.5 g/dL — ABNORMAL LOW (ref 12.0–15.0)
MCH: 24.8 pg — ABNORMAL LOW (ref 26.0–34.0)
MCHC: 31.5 g/dL (ref 30.0–36.0)
MCV: 78.7 fL — ABNORMAL LOW (ref 80.0–100.0)
Platelets: 335 K/uL (ref 150–400)
RBC: 4.64 MIL/uL (ref 3.87–5.11)
RDW: 14.8 % (ref 11.5–15.5)
WBC: 7 K/uL (ref 4.0–10.5)
nRBC: 0 % (ref 0.0–0.2)

## 2024-01-07 LAB — BASIC METABOLIC PANEL WITH GFR
Anion gap: 14 (ref 5–15)
BUN: 31 mg/dL — ABNORMAL HIGH (ref 8–23)
CO2: 30 mmol/L (ref 22–32)
Calcium: 9.4 mg/dL (ref 8.9–10.3)
Chloride: 96 mmol/L — ABNORMAL LOW (ref 98–111)
Creatinine, Ser: 1.16 mg/dL — ABNORMAL HIGH (ref 0.44–1.00)
GFR, Estimated: 47 mL/min — ABNORMAL LOW (ref >60)
Glucose, Bld: 103 mg/dL — ABNORMAL HIGH (ref 70–99)
Potassium: 4.8 mmol/L (ref 3.5–5.1)
Sodium: 140 mmol/L (ref 135–145)

## 2024-01-07 NOTE — Patient Instructions (Signed)
 Visit Information  Thank you for taking time to visit with me today. Please don't hesitate to contact me if I can be of assistance to you before our next scheduled telephone appointment.  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 01/07/24 PCP office to call daughter back because out of town, daughter to follow up with specialist  Goal:  Over the next 30 days, the patient will not experience hospital readmission  Interventions:   Surgery (Serous Carcinoma of femal pelvis): Evaluation of current treatment plan related to abdominal surgery assessed patient/caregiver understanding of surgical procedure   reviewed post-operative instructions with patient/caregiver addressed questions about post - surgical incision care  reviewed medications with patient and addressed questions reviewed scheduled provider appointments with patient/daughter [name] confirmed availability of transportation to all appointments with daughter [name], has several   Patient Self Care Activities:  Attend all scheduled provider appointments Call pharmacy for medication refills 3-7 days in advance of running out of medications Call provider office for new concerns or questions  Notify RN Care Manager of [facility] call rescheduling needs Participate in Transition of Care Program/Attend [facility] scheduled calls Take medications as prescribed   01/07/24 Family arranged for lift chair which arrived today  Plan:  The care  management team will reach out to the patient again over the next 5-10 business  days. The patient has been provided with contact information for the care management team and has been advised to call with any health related questions or concerns.  Daughter will follow up on labs to be drawn today also follow up with PCP office for appointment with them again.        Patient verbalizes understanding of instructions and care plan provided today and agrees to view in MyChart. Active MyChart status and patient understanding of how to access instructions and care plan via MyChart confirmed with patient.     The patient has been provided with contact information for the care management team and has been advised to call with any health related questions or concerns.  The care management team will reach out to the patient again over the next 5-10 business days days.  The patient will call Oncology team* as advised to if needs refill for Eliquis .   Please call the care guide team at [phone] if you need to cancel or reschedule your appointment.   Please call the [place]  National Suicide Prevention Lifeline: 509-072-1844 or TTY: (331) 689-9058 TTY 850-831-5570) to talk to a trained counselor if you are experiencing a Mental Health or Behavioral Health Crisis or need someone to talk to.  Richerd Fish, RN, BSN, CCM [facility], [facility] RN Care Manager Direct Dial: [phone]

## 2024-01-07 NOTE — Transitions of Care (Post Inpatient/ED Visit) (Signed)
 Transition of Care week 2  Visit Note  01/07/2024  Name: Nancy Liu MRN: [id number]          DOB: 27-Aug-1940  Situation: Patient enrolled in [facility] 30-day program. Visit completed with patient (gives permission) and completed by daughter [name] by telephone.   Background:   Initial Transition Care Management Follow-up Telephone Call    Past Medical History:  Diagnosis Date   Anxiety    Arteriosclerotic cardiovascular disease (ASCVD) 2001   Bare-metal stent placed in the right coronary artery in 12/01; residual 50% lesion of the first diagonal and mid LAD   Arthritis    Breast cancer (HCC)    right 2021   Cerebrovascular disease    Chronic kidney disease    COPD (chronic obstructive pulmonary disease) (HCC)    Coronary artery disease    Stent   Cyst, dermoid, arm, left 03/26/2018   Depression    Diabetes mellitus    excellent control with a low-dose of a single oral agent   Family history of breast cancer 01/23/2021   Family history of ovarian cancer 01/23/2021   GERD (gastroesophageal reflux disease)    with ulcers   History of right coronary artery stent placement 2000   Hyperlipidemia    Hypertension    Sleep apnea    no cpap   Thalassemia minor    Tobacco abuse, in remission    35 pack years; Quit in 1980    Assessment: Patient Reported Symptoms: Cognitive Cognitive Status: Able to follow simple commands, Struggling with memory recall Cognitive/Intellectual Conditions Management [RPT]: Other Other: Alzheimers Dementia      Neurological Neurological Review of Symptoms: Weakness (better) Neurological Management Strategies: Activity, Adequate rest, Medication therapy Neurological Self-Management Outcome: 3 (uncertain)  HEENT HEENT Symptoms Reported: Other: (Hard of hearing at times) HEENT Management Strategies: Routine screening HEENT Self-Management Outcome: 3 (uncertain)    Cardiovascular Cardiovascular Symptoms Reported: No symptoms  reported Cardiovascular Management Strategies: Activity Cardiovascular Self-Management Outcome: 3 (uncertain)  Respiratory Respiratory Symptoms Reported: No symptoms reported    Endocrine Endocrine Symptoms Reported: No symptoms reported Is patient diabetic?: Yes Endocrine Self-Management Outcome: 3 (uncertain)  Gastrointestinal Gastrointestinal Symptoms Reported: Constipation Additional Gastrointestinal Details: giving medication Gastrointestinal Management Strategies: Activity, Medication therapy Gastrointestinal Self-Management Outcome: 3 (uncertain)    Genitourinary Genitourinary Symptoms Reported: No symptoms reported Additional Genitourinary Details: encouraged good peri hygiene to decrease urinary issues Genitourinary Self-Management Outcome: 3 (uncertain)  Integumentary Integumentary Symptoms Reported: Wound, Incision Additional Integumentary Details: laproscopic wound Skin Management Strategies: Medication therapy Skin Self-Management Outcome: 4 (good)  Musculoskeletal Musculoskelatal Symptoms Reviewed: Weakness Additional Musculoskeletal Details: Strength improving Musculoskeletal Management Strategies: Activity, Medication therapy Musculoskeletal Self-Management Outcome: 3 (uncertain)      Psychosocial Psychosocial Symptoms Reported: No symptoms reported Additional Psychological Details: it hard to tell with her dementia, good days and bad days, I think it's more of the dementia than anything else per daughter [name] Management Strategies: Medication therapy       There were no vitals filed for this visit.  Medications Reviewed Today     Reviewed by Eilleen Richerd GRADE, RN (Registered Nurse) on 01/07/24 at 1446  Med List Status: <None>   Medication Order Taking? Sig Documenting Provider Last Dose Status Informant  amLODipine  (NORVASC ) 10 MG tablet [id number] Yes TAKE ONE TABLET BY MOUTH EVERYDAY AT BEDTIME Alvan Dorn FALCON, MD  Active Family Member   apixaban  (ELIQUIS ) 2.5 MG TABS tablet [id number] Yes Take 1 tablet (2.5 mg total) by mouth 2 (two)  times daily. Cross, Melissa D, NP  Active   carvedilol  (COREG ) 6.25 MG tablet [id number] Yes TAKE ONE TABLET BY MOUTH BEFORE BREAKFAST and TAKE ONE TABLET BY MOUTH EVERYDAY AT BEDTIME Ricky Fines, MD  Active Family Member  Cholecalciferol (VITAMIN D3) 50 MCG (2000 UT) TABS [id number]  TAKE ONE TABLET BY MOUTH daily in addition TO THE 1,000unit FOR total of 3,000units daily  Patient taking differently: Take 2,000 Units by mouth every other day.   Lamon Pleasant HERO, PA-C  Active Family Member  cyanocobalamin  (VITAMIN B12) 1000 MCG tablet [id number]  Take 1 tablet (1,000 mcg total) by mouth daily.  Patient taking differently: Take 1,000 mcg by mouth every other day.   Davonna Siad, MD  Active Family Member  docusate sodium  (COLACE) 100 MG capsule [id number] Yes Take 1 capsule (100 mg total) by mouth 2 (two) times daily. Pappayliou, Dorothyann LABOR, DO  Active Family Member           Med Note ALYNE, SUZEN ORN   Tue Dec 29, 2023 10:51 AM) Not started   donepezil  (ARICEPT ) 5 MG tablet [id number]  Take 1 tablet (5 mg total) by mouth at bedtime.  Patient not taking: Reported on 01/01/2024   [name], DO  Active Family Member  feeding supplement (ENSURE ENLIVE / ENSURE PLUS) LIQD [id number] Yes Take 237 mLs by mouth 3 (three) times daily between meals. Maree Bracken D, DO  Active Family Member  ferrous sulfate  325 (65 FE) MG EC tablet [id number]  Take 1 tablet (325 mg total) by mouth every other day.  Patient not taking: Reported on 01/01/2024   Davonna Siad, MD  Active Family Member  irbesartan -hydrochlorothiazide  (AVALIDE) 300-12.5 MG tablet [id number] Yes Take 1 tablet by mouth daily before breakfast. Maree Bracken D, DO  Active Family Member  metFORMIN  (GLUCOPHAGE ) 500 MG tablet [id number] Yes Take 1 tablet (500 mg total) by mouth at bedtime. Maree Bracken D, DO  Active Family Member  mirtazapine   (REMERON ) 7.5 MG tablet [id number] Yes Take 1 tablet (7.5 mg total) by mouth at bedtime. For depression and weight loss. Lamon Pleasant HERO, PA-C  Active Family Member  [facility] ULTRA test strip [id number] Yes SMARTSIG:Strip(s) [provider]  Active Family Member  oxyCODONE  (ROXICODONE ) 5 MG immediate release tablet [id number]  Take 1 tablet (5 mg total) by mouth every 6 (six) hours as needed for severe pain (pain score 7-10). For AFTER surgery only, do not take and drive  Patient not taking: Reported on 01/07/2024   Micheline Eleanor BIRCH, NP  Active Family Member           Med Note ALYNE SUZEN ORN   Tue Dec 29, 2023 10:53 AM) Not started  rosuvastatin  (CRESTOR ) 20 MG tablet [id number] Yes TAKE ONE TABLET BY MOUTH EVERYDAY AT BEDTIME Alvan Dorn FALCON, MD  Active Family Member  senna-docusate (SENOKOT-S) 8.6-50 MG tablet [id number]  Take 2 tablets by mouth at bedtime. For AFTER surgery, do not take if having diarrhea  Patient not taking: Reported on 01/07/2024   Micheline Eleanor BIRCH, NP  Active Family Member           Med Note ALYNE, SUZEN ORN   Tue Dec 29, 2023 10:53 AM) Not started  triamcinolone ointment (KENALOG) 0.5 % [id number] Yes Apply 1 application  topically 2 (two) times daily as needed (rash). [provider]  Active Family Member           Med Note JERALYN, [place] A   Tue Oct 13, 2023 11:21 AM)    Med List Note Lesly, Richerd GRADE, RN 01/01/24 1623): 01/01/24 spoke with daughter [name] and reviewed via AVS from hospital and updated   Pt's son Loman helps with meds [phone]            Recommendation:   Continue Current Plan of Care  Follow Up Plan:   Daughter to follow up with PCP office, they were to call back with a hospital follow up appointment.  Richerd Fish, RN, BSN, CCM [facility], [facility] RN Care Manager Direct Dial: [phone]

## 2024-01-11 ENCOUNTER — Telehealth: Payer: Self-pay | Admitting: *Deleted

## 2024-01-11 NOTE — Telephone Encounter (Signed)
 Spoke with patient's daughter [name] and relayed results of lab work (BMP) per Dr. Eldonna, patient's Bun and Creatine (Kidney Function) are improving. Per [name] patient is keeping herself hydrated and she has tried some electrolyte drinks as well as just plain water .  Advised to continue with hydration and call the office with any concerns or questions. [name] thanked the office for calling.

## 2024-01-12 ENCOUNTER — Ambulatory Visit: Payer: Self-pay | Admitting: Psychiatry

## 2024-01-13 ENCOUNTER — Other Ambulatory Visit: Payer: Self-pay | Admitting: Physician Assistant

## 2024-01-13 ENCOUNTER — Other Ambulatory Visit: Payer: Self-pay

## 2024-01-13 DIAGNOSIS — R634 Abnormal weight loss: Secondary | ICD-10-CM

## 2024-01-13 DIAGNOSIS — F32A Depression, unspecified: Secondary | ICD-10-CM

## 2024-01-14 ENCOUNTER — Encounter: Payer: Self-pay | Admitting: Psychiatry

## 2024-01-15 ENCOUNTER — Telehealth: Payer: Self-pay

## 2024-01-15 ENCOUNTER — Other Ambulatory Visit: Payer: Self-pay

## 2024-01-15 DIAGNOSIS — R419 Unspecified symptoms and signs involving cognitive functions and awareness: Secondary | ICD-10-CM

## 2024-01-15 DIAGNOSIS — D649 Anemia, unspecified: Secondary | ICD-10-CM

## 2024-01-15 DIAGNOSIS — C569 Malignant neoplasm of unspecified ovary: Secondary | ICD-10-CM

## 2024-01-15 DIAGNOSIS — Z9011 Acquired absence of right breast and nipple: Secondary | ICD-10-CM

## 2024-01-15 DIAGNOSIS — E119 Type 2 diabetes mellitus without complications: Secondary | ICD-10-CM

## 2024-01-15 DIAGNOSIS — N1831 Chronic kidney disease, stage 3a: Secondary | ICD-10-CM

## 2024-01-15 NOTE — Telephone Encounter (Signed)
 [name], pt's daughter, called asking for a refill of the Eliquis , they just need a few to get her to the PCP appointment on 8/20.

## 2024-01-15 NOTE — Transitions of Care (Post Inpatient/ED Visit) (Signed)
 Transition of Care week 3  Visit Note  01/15/2024  Name: Nancy Liu MRN: [id number]          DOB: November 05, 1940  Situation: Patient enrolled in [facility] 30-day program. Visit completed with patient and daughter Nancy Liu by telephone.   Background:   Initial Transition Care Management Follow-up Telephone Call    Past Medical History:  Diagnosis Date   Anxiety    Arteriosclerotic cardiovascular disease (ASCVD) 2001   Bare-metal stent placed in the right coronary artery in 12/01; residual 50% lesion of the first diagonal and mid LAD   Arthritis    Breast cancer (HCC)    right 2021   Cerebrovascular disease    Chronic kidney disease    COPD (chronic obstructive pulmonary disease) (HCC)    Coronary artery disease    Stent   Cyst, dermoid, arm, left 03/26/2018   Depression    Diabetes mellitus    excellent control with a low-dose of a single oral agent   Family history of breast cancer 01/23/2021   Family history of ovarian cancer 01/23/2021   GERD (gastroesophageal reflux disease)    with ulcers   History of right coronary artery stent placement 2000   Hyperlipidemia    Hypertension    Sleep apnea    no cpap   Thalassemia minor    Tobacco abuse, in remission    35 pack years; Quit in 1980    Assessment: Patient Reported Symptoms: Cognitive Cognitive Status: Able to follow simple commands, Struggling with memory recall, Normal speech and language skills      Neurological Neurological Review of Symptoms: Weakness Neurological Management Strategies: Activity, Adequate rest, Medication therapy Neurological Self-Management Outcome: 4 (good)  HEENT HEENT Symptoms Reported: Other: (Hard of hearing) HEENT Management Strategies: Routine screening HEENT Self-Management Outcome: 3 (uncertain)    Cardiovascular Cardiovascular Symptoms Reported: No symptoms reported Cardiovascular Management Strategies: Activity Cardiovascular Self-Management Outcome: 4 (good)  Respiratory  Respiratory Symptoms Reported: No symptoms reported Other Respiratory Symptoms: clearing throat Respiratory Self-Management Outcome: 4 (good)  Endocrine Is patient diabetic?: Yes (last checked 116 on Tuesday) Is patient checking blood sugars at home?: No (Patient desires the Dexacom to keep more accurate readings and needs.) Endocrine Self-Management Outcome: 4 (good)  Gastrointestinal Gastrointestinal Symptoms Reported: Constipation Additional Gastrointestinal Details: taking Miralax  Gastrointestinal Management Strategies: Activity, Medication therapy Gastrointestinal Self-Management Outcome: 3 (uncertain)    Genitourinary Genitourinary Symptoms Reported: No symptoms reported Genitourinary Management Strategies: Incontinence garment/pad Genitourinary Self-Management Outcome: 4 (good)  Integumentary Integumentary Symptoms Reported: Wound, Incision Additional Integumentary Details: laproscopic wound Skin Management Strategies: Medication therapy Skin Self-Management Outcome: 4 (good)  Musculoskeletal Musculoskelatal Symptoms Reviewed: Weakness Musculoskeletal Management Strategies: Activity, Medical device, Medication therapy Musculoskeletal Self-Management Outcome: 4 (good)      Psychosocial Psychosocial Symptoms Reported: No symptoms reported Additional Psychological Details: it hard to tell with her dementia, good days and bad days, I think it's more of the dementia than anything else per daughter Nancy Liu [place] Management Strategies: Medication therapy       Vitals:   01/15/24 1515  BP: 120/70    Medications Reviewed Today     Reviewed by Eilleen Richerd GRADE, RN (Registered Nurse) on 01/15/24 at 1645  Med List Status: <None>   Medication Order Taking? Sig Documenting Provider Last Dose Status Informant  amLODipine  (NORVASC ) 10 MG tablet [id number] Yes TAKE ONE TABLET BY MOUTH EVERYDAY AT BEDTIME Alvan Dorn FALCON, MD  Active Family Member  apixaban  (ELIQUIS ) 2.5 MG TABS  tablet [id number] Yes Take  1 tablet (2.5 mg total) by mouth 2 (two) times daily. Micheline Setter D, NP  Active   carvedilol  (COREG ) 6.25 MG tablet [id number] Yes TAKE ONE TABLET BY MOUTH BEFORE BREAKFAST and TAKE ONE TABLET BY MOUTH EVERYDAY AT BEDTIME Ricky Fines, MD  Active Family Member  Cholecalciferol (VITAMIN D3) 50 MCG (2000 UT) TABS [id number]  TAKE ONE TABLET BY MOUTH daily in addition TO THE 1,000unit FOR total of 3,000units daily  Patient not taking: Reported on 01/15/2024   Lamon Pleasant HERO, PA-C  Active Family Member  cyanocobalamin  (VITAMIN B12) 1000 MCG tablet [id number]  Take 1 tablet (1,000 mcg total) by mouth daily.  Patient not taking: Reported on 01/15/2024   Davonna Siad, MD  Active Family Member  docusate sodium  (COLACE) 100 MG capsule [id number]  Take 1 capsule (100 mg total) by mouth 2 (two) times daily.  Patient not taking: Reported on 01/15/2024   Pappayliou, Dorothyann LABOR, DO  Active Family Member           Med Note ALYNE, SUZEN ORN   Tue Dec 29, 2023 10:51 AM) Not started   donepezil  (ARICEPT ) 5 MG tablet [id number]  Take 1 tablet (5 mg total) by mouth at bedtime.  Patient not taking: Reported on 01/15/2024   [name], DO  Active Family Member  feeding supplement (ENSURE ENLIVE / ENSURE PLUS) LIQD [id number] Yes Take 237 mLs by mouth 3 (three) times daily between meals. Maree Bracken D, DO  Active Family Member  ferrous sulfate  325 (65 FE) MG EC tablet [id number]  Take 1 tablet (325 mg total) by mouth every other day.  Patient not taking: Reported on 01/15/2024   Davonna Siad, MD  Active Family Member  irbesartan -hydrochlorothiazide  (AVALIDE) 300-12.5 MG tablet [id number] Yes Take 1 tablet by mouth daily before breakfast. Maree Bracken D, DO  Active Family Member  metFORMIN  (GLUCOPHAGE ) 500 MG tablet [id number] Yes Take 1 tablet (500 mg total) by mouth at bedtime. Maree Bracken D, DO  Active Family Member  mirtazapine  (REMERON ) 7.5 MG tablet [id number] Yes TAKE ONE  TABLET BY MOUTH AT BEDTIME OR DEPRESSION AND WEIGHT LOSS Lamon Pleasant HERO DEVONNA  Active   [facility] ULTRA test strip [id number] Yes SMARTSIG:Strip(s) [provider]  Active Family Member  oxyCODONE  (ROXICODONE ) 5 MG immediate release tablet [id number]  Take 1 tablet (5 mg total) by mouth every 6 (six) hours as needed for severe pain (pain score 7-10). For AFTER surgery only, do not take and drive  Patient not taking: Reported on 01/15/2024   Micheline Setter BIRCH, NP  Active Family Member           Med Note ALYNE, SUZEN ORN   Tue Dec 29, 2023 10:53 AM) Not started  polyethylene glycol powder (GLYCOLAX /MIRALAX ) 17 GM/SCOOP powder [id number] Yes Take by mouth once. [provider]  Active   rosuvastatin  (CRESTOR ) 20 MG tablet [id number] Yes TAKE ONE TABLET BY MOUTH EVERYDAY AT BEDTIME Alvan Dorn FALCON, MD  Active Family Member  senna-docusate (SENOKOT-S) 8.6-50 MG tablet [id number]  Take 2 tablets by mouth at bedtime. For AFTER surgery, do not take if having diarrhea  Patient not taking: Reported on 01/15/2024   Micheline Setter BIRCH, NP  Active Family Member           Med Note ALYNE, SUZEN ORN   Tue Dec 29, 2023 10:53 AM) Not started  triamcinolone ointment (KENALOG) 0.5 % [id number] Yes Apply 1 application  topically 2 (two) times daily as needed (rash). [provider]  Active Family Member           Med Note JERALYN ROCKEY LABOR   Tue Oct 13, 2023 11:21 AM)    Med List Note Lesly, Richerd GRADE, [place] 01/01/24 1623): 01/01/24 spoke with daughter Nancy Liu Och and reviewed via AVS from hospital and updated   Pt's son Loman helps with meds [phone]            Recommendation:   Continue Current Plan of Care  Follow Up Plan:   Referral to Pharmacist to review patient with polypharmacy and potential medication side effects. Patient states several medicines from different providers.  Richerd Fish, RN, BSN, CCM [facility], [facility]  Health RN Care Manager Direct Dial: [phone]

## 2024-01-15 NOTE — Patient Instructions (Signed)
 Visit Information  Thank you for taking time to visit with me today. Please don't hesitate to contact me if I can be of assistance to you before our next scheduled telephone appointment.  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 01/07/24 PCP office to call daughter back because out of town, daughter to follow up with specialist 01/15/24 Patient and daughter wanting to pause vitamins and medications.  Patient is feeling medications causing to sleep too much in the day and she feels some medications are causing her to awake during the night and going to the door to check on her grandkids and she realizes that it's not true.   Goal:  Over the next 30 days, the patient will not experience hospital readmission  Interventions:   Surgery (Serous Carcinoma of femal pelvis): Evaluation of current treatment plan related to abdominal surgery assessed patient/caregiver understanding of surgical procedure   reviewed post-operative instructions with patient/caregiver addressed questions about post - surgical incision care  reviewed medications with patient and addressed questions reviewed scheduled provider appointments with patient/daughter [name] confirmed availability of transportation to all appointments with daughter [name], has several  Daughter having difficulty with doing CBG and desires CBG monitor like Dexacam, to  follow up with PCP - Dr. Benjamine on 01/20/24 3:30 pm appointment Pharmacy referral made to assist patient and family with ongoing medication management and assessment.  Patient Self Care Activities:  Attend all scheduled provider appointments Call pharmacy for medication refills 3-7 days in advance of running out of medications Call provider office for new concerns or questions  Notify RN Care Manager of [facility] call rescheduling needs Participate in Transition of Care Program/Attend TOC scheduled calls Take medications as prescribed   01/07/24 Family arranged for lift chair which arrived today. 01/15/24 Spoke with daughter regarding getting alarm or alert system to let them know when patient is getting up to door at night.  Verbalized understanding using the teach back method.  Plan:  The care management team will reach out to the patient again over the next 5-10 business  days. The patient has been provided with contact information for the care management team and has been advised to call with any health related questions or concerns.  Daughter will follow up on labs to be drawn today also follow up with PCP office for appointment with them again. 01/15/24 Patient and daughter to work with Pharmacist on needs and for possible side effects of medications.        Patient verbalizes understanding of instructions and care plan provided today and agrees to view in MyChart. Active MyChart status and patient understanding of how to access instructions and care plan via MyChart confirmed with patient.     The patient has been provided with contact information for the care management team and has been advised to call with any health related  questions or concerns.  Follow up with provider re: any further behaviors of concerns and medications she is concerned about taking at appointment. Write questions down.  Please call the care guide team at [phone] if you need to cancel or reschedule your  appointment.   Please call the [place]  National Suicide Prevention Lifeline: (831)356-7416 or TTY: 443-568-2903 TTY 231-483-6395) to talk to a trained counselor call 1-800-273-TALK (toll free, 24 hour hotline) if you are experiencing a Mental Health or Behavioral Health Crisis or need someone to talk to.  Richerd Fish, RN, BSN, CCM [facility], [facility] RN Care Manager Direct Dial: [phone]

## 2024-01-15 NOTE — Telephone Encounter (Signed)
 Nancy Liu is aware of message from Eleanor Epps NP. She was excited to hear this great news and was thankful for the call

## 2024-01-18 ENCOUNTER — Telehealth: Payer: Self-pay

## 2024-01-18 ENCOUNTER — Encounter: Payer: Self-pay | Admitting: Psychiatry

## 2024-01-18 ENCOUNTER — Inpatient Hospital Stay: Attending: Oncology | Admitting: Psychiatry

## 2024-01-18 VITALS — BP 137/61 | HR 70 | Temp 98.4°F | Resp 18 | Wt 130.8 lb

## 2024-01-18 DIAGNOSIS — C5700 Malignant neoplasm of unspecified fallopian tube: Secondary | ICD-10-CM | POA: Diagnosis not present

## 2024-01-18 DIAGNOSIS — Z87891 Personal history of nicotine dependence: Secondary | ICD-10-CM | POA: Insufficient documentation

## 2024-01-18 DIAGNOSIS — Z90722 Acquired absence of ovaries, bilateral: Secondary | ICD-10-CM | POA: Diagnosis not present

## 2024-01-18 DIAGNOSIS — Z9011 Acquired absence of right breast and nipple: Secondary | ICD-10-CM | POA: Insufficient documentation

## 2024-01-18 DIAGNOSIS — E1122 Type 2 diabetes mellitus with diabetic chronic kidney disease: Secondary | ICD-10-CM | POA: Diagnosis not present

## 2024-01-18 DIAGNOSIS — Z90710 Acquired absence of both cervix and uterus: Secondary | ICD-10-CM | POA: Insufficient documentation

## 2024-01-18 DIAGNOSIS — Z7901 Long term (current) use of anticoagulants: Secondary | ICD-10-CM | POA: Insufficient documentation

## 2024-01-18 DIAGNOSIS — Z7984 Long term (current) use of oral hypoglycemic drugs: Secondary | ICD-10-CM | POA: Diagnosis not present

## 2024-01-18 DIAGNOSIS — Z79899 Other long term (current) drug therapy: Secondary | ICD-10-CM | POA: Insufficient documentation

## 2024-01-18 DIAGNOSIS — I2510 Atherosclerotic heart disease of native coronary artery without angina pectoris: Secondary | ICD-10-CM | POA: Insufficient documentation

## 2024-01-18 DIAGNOSIS — N189 Chronic kidney disease, unspecified: Secondary | ICD-10-CM | POA: Diagnosis not present

## 2024-01-18 DIAGNOSIS — Z803 Family history of malignant neoplasm of breast: Secondary | ICD-10-CM | POA: Diagnosis not present

## 2024-01-18 DIAGNOSIS — Z86000 Personal history of in-situ neoplasm of breast: Secondary | ICD-10-CM | POA: Diagnosis not present

## 2024-01-18 DIAGNOSIS — C774 Secondary and unspecified malignant neoplasm of inguinal and lower limb lymph nodes: Secondary | ICD-10-CM | POA: Insufficient documentation

## 2024-01-18 DIAGNOSIS — Z801 Family history of malignant neoplasm of trachea, bronchus and lung: Secondary | ICD-10-CM | POA: Diagnosis not present

## 2024-01-18 DIAGNOSIS — I129 Hypertensive chronic kidney disease with stage 1 through stage 4 chronic kidney disease, or unspecified chronic kidney disease: Secondary | ICD-10-CM | POA: Diagnosis not present

## 2024-01-18 NOTE — Patient Instructions (Signed)
 It was a pleasure to see you in clinic today. - You are healing well from surgery - We discussed that typically we would do chemotherapy, but based on your goals of care, we discussed close observation. - Return visit planned for 3 months  Thank you very much for allowing me to provide care for you today.  I appreciate your confidence in choosing our Gynecologic Oncology team at [facility].  If you have any questions about your visit today please call our office or send us  a MyChart message and we will get back to you as soon as possible.

## 2024-01-18 NOTE — Progress Notes (Signed)
 Gynecologic Oncology Return Clinic Visit  Date of Service: 01/18/2024 Referring Provider: [name], MD   Assessment & Plan: Nancy Liu is a 83 y.o. 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  Postop: - Pt recovering well from surgery and healing appropriately postoperatively - Ongoing postoperative expectations and precautions reviewed.   Fallopian tube carcinoma: - Intraoperative findings and pathology results reviewed. - Prior germline testing negative. - Reviewed that her overall burden of disease is quite low, but stage IVb given metastatic to the left groin. -Reviewed that standard of care treatment would be adjuvant chemotherapy.  Reviewed patient's previously discussed goals and wishes.  Based on this we discussed nonstandard of care consideration of close observation, which I think is reasonable. - Reviewed that with negative germline testing we would often perform NGS but this would be to aid in treatment plan (parpi). Given that she does not desire treatment, I do not feel that she would benefit from NGS. - Reviewed surveillance with exams and monitoring of symptoms.  CA125 is not a marker for her. - Given that patient would not desire intervention except for if development of symptoms, palliation of symptoms, we discussed the pros and cons of imaging monitoring.  Feel that it is reasonable to defer imaging to symptoms or changing exam and patient wishes to pursue this route.  She does have follow-up with her medical oncologist and they can revisit if they would like to monitor with imaging. -Otherwise, would recommend every 3 monthly evaluation with physical exam and symptom monitoring.  Patient is in agreement with this plan.   RTC 41mo.  Hoy Masters, MD Gynecologic Oncology   Medical Decision Making I personally spent  TOTAL 25 minutes face-to-face and  non-face-to-face in the care of this patient, which includes all pre, intra, and post visit time on the date of service. The discussion of fallopian tube carcinoma is beyond the scope of routine postoperative care.   ----------------------- Reason for Visit: Postop/Treatment discussion  Treatment History: Oncology History  Ductal carcinoma in situ (DCIS) of right breast  01/01/2021 Mammogram   FINDINGS: Multiple circumscribed oval masses are noted in the MEDIAL portion of the RIGHT breast. Masses vary from 1-4 millimeters in diameter and appear contiguous, spanning 8.9 x 3.6 x 5.1 centimeters. There are faint calcifications spanning 4.2 x 1.9 x 3.5 centimeters in this same, segmental distribution.   01/14/2021 Cancer Staging   Staging form: Breast, AJCC 8th Edition - Clinical stage from 01/14/2021: Stage 0 (cTis (DCIS), cN0, cM0, G2, ER+, PR+, HER2: Not Assessed) - Signed by [name], MD on 01/21/2021 Stage prefix: Initial diagnosis Histologic grading system: 3 grade system   01/14/2021 Pathology Results   Diagnosis 1. Breast, right, needle core biopsy, anterior extent - DUCTAL CARCINOMA IN SITU, INTERMEDIATE GRADE WITH CALCIFICATIONS. SEE NOTE 2. Breast, right, needle core biopsy, posterior extent - DUCTAL CARCINOMA IN SITU, INTERMEDIATE GRADE WITH CALCIFICATIONS. SEE NOTE Diagnosis Note 1. and 2. DCIS measures 0.6 cm in part 1 and 0.4 cm in part 2.  1. PROGNOSTIC INDICATORS Results: IMMUNOHISTOCHEMICAL AND MORPHOMETRIC ANALYSIS PERFORMED MANUALLY Estrogen Receptor: >95%, POSITIVE, STRONG STAINING INTENSITY Progesterone Receptor: >95%, POSITIVE, STRONG STAINING INTENSITY     01/17/2021 Initial Diagnosis   Ductal carcinoma in situ (DCIS) of right breast   02/05/2021 Genetic Testing   Negative hereditary cancer genetic testing: no pathogenic variants detected in [employer] CustomNext-Cancer +RNAinsight Panel.  The report date  is February 05, 2021.   The CustomNext-Cancer+RNAinsight panel  offered by [employer] includes sequencing and rearrangement analysis for the following 47 genes:  APC, ATM, AXIN2, BARD1, BMPR1A, BRCA1, BRCA2, BRIP1, CDH1, CDK4, CDKN2A, CHEK2, DICER1, EPCAM, GREM1, HOXB13, MEN1, MLH1, MSH2, MSH3, MSH6, MUTYH, NBN, NF1, NF2, NTHL1, PALB2, PMS2, POLD1, POLE, PTEN, RAD51C, RAD51D, RECQL, RET, SDHA, SDHAF2, SDHB, SDHC, SDHD, SMAD4, SMARCA4, STK11, TP53, TSC1, TSC2, and VHL.  RNA data is routinely analyzed for use in variant interpretation for all genes.   02/20/2021 Cancer Staging   Staging form: Breast, AJCC 8th Edition - Pathologic stage from 02/20/2021: No Stage Recommended (pTis (DCIS), pNX, cM1, G2, ER+, PR+, HER2: Not Assessed) - Signed by [name], MD on 03/12/2021 Stage prefix: Initial diagnosis Histologic grading system: 3 grade system Residual tumor (R): R0 - None   02/20/2021 Definitive Surgery   FINAL MICROSCOPIC DIAGNOSIS:   A. BREAST, RIGHT, MASTECTOMY:  - Multifocal intermediate grade ductal carcinoma in situ with calcifications.  - Atypical lobular hyperplasia.  - Margins are negative for carcinoma.  - Biopsy site (x2).  - See oncology table.    High grade serous carcinoma (HCC)  10/01/2023 PET scan   IMPRESSION: 1. Hypermetabolic left inguinal lymph node, maximum SUV 9.2. Given the high SUV, neoplastic involvement is a distinct possibility tissue sampling should be considered. 2. Multifocal accentuated activity in the colon is likely physiologic given the lack of correlate of CT findings, although technically nonspecific. 3. Right mastectomy.   10/21/2023 Pathology Results    FINAL MICROSCOPIC DIAGNOSIS:   A. LYMPH NODE, INGUINAL, BIOPSY:  - Metastatic poorly differentiated adenocarcinoma, see comment   Immunohistochemical stains show that the tumor cells are positive for  CK7 and PAX8 with patchy, weak labeling for CK5/6 and p16.  Immunostains for CK20, CDX2, GATA3 and p63 are negative.  This immunoprofile is most  consistent with a  gynecologic primary while differential diagnosis can  include a primary renal cell carcinoma but is less likely.   Immunohistochemical stain for p53 shows a clonal loss of expression pattern, most consistent with metastatic high-grade serous carcinoma.    11/10/2023 Cancer Staging   Staging form: Ovary, AJCC 7th Edition - Clinical stage from 11/10/2023: Stage IIIC (TX, N1, M0) - Signed by Davonna Siad, MD on 11/13/2023 Stage prefix: Initial diagnosis Source of metastatic specimen: Inguinal Lymph Node Histologic grade (G): G4 Lymph-vascular invasion (LVI): Presence of LVI unknown/indeterminate   11/13/2023 Initial Diagnosis   High grade serous carcinoma (HCC)   11/19/2023 Pathology Results   Cancer type ID: Ovary, 90% probability Serous adenocarcinoma       Interval History: Pt reports that she is recovering well from surgery. She is not having any pain. She is eating and drinking but not quite back to her baseline. She is voiding without issue and having some improvement in her bowel movements.   Past Medical/Surgical History: Past Medical History:  Diagnosis Date   Anxiety    Arteriosclerotic cardiovascular disease (ASCVD) 2001   Bare-metal stent placed in the right coronary artery in 12/01; residual 50% lesion of the first diagonal and mid LAD   Arthritis    Breast cancer (HCC)    right 2021   Cerebrovascular disease    Chronic kidney disease    COPD (chronic obstructive pulmonary disease) (HCC)    Coronary artery disease    Stent   Cyst, dermoid, arm, left 03/26/2018   Depression    Diabetes mellitus    excellent control with a low-dose  of a single oral agent   Family history of breast cancer 01/23/2021   Family history of ovarian cancer 01/23/2021   GERD (gastroesophageal reflux disease)    with ulcers   History of right coronary artery stent placement 2000   Hyperlipidemia    Hypertension    Sleep apnea    no cpap   Thalassemia minor    Tobacco abuse, in  remission    35 pack years; Quit in 1980    Past Surgical History:  Procedure Laterality Date   BIOPSY  02/28/2020   Procedure: BIOPSY;  Surgeon: [name], DO;  [facility]: [facility];  Service: Endoscopy;;   CARDIAC CATHETERIZATION     stent   COLONOSCOPY  2006   Dr. Kristie: normal   COLONOSCOPY  2000   Dr. Gardiner: normal    COLONOSCOPY N/A 03/03/2016   Dr. Harvey: diverticulosis, non-bleeding hemorrhoids, redundant left colon.   DILATION AND CURETTAGE OF UTERUS     ESOPHAGOGASTRODUODENOSCOPY  2000   Dr. Gardiner: gastritis    ESOPHAGOGASTRODUODENOSCOPY  2006   Dr. Kristie: small hiatal hernia, gastritis, negative H.pylori    ESOPHAGOGASTRODUODENOSCOPY N/A 03/03/2016   Procedure: ESOPHAGOGASTRODUODENOSCOPY (EGD);  Surgeon: Margo LITTIE Harvey, MD;  Location: [facility];  Service: Endoscopy;  Laterality: N/A;   ESOPHAGOGASTRODUODENOSCOPY N/A 06/03/2016   Dr. Harvey: nonaggressive gastritis due to aspirin  use. Previous ulcers had healed.   ESOPHAGOGASTRODUODENOSCOPY (EGD) WITH PROPOFOL  N/A 02/28/2020   focal inflammation in the gastric antrum consistent with gastritis on biopsies. No H.pylori.    EXCISION OF KELOID Left 03/26/2018   Procedure: EXCISION OF LEFT ARM MASS;  Surgeon: Gail Favorite, MD;  Location:  SURGERY CENTER;  Service: General;  Laterality: Left;   GIVENS CAPSULE STUDY N/A 10/10/2020   Procedure: GIVENS CAPSULE STUDY;  Surgeon: [name], DO;  Location: [facility];  Service: Endoscopy;  Laterality: N/A;  7:30am   INGUINAL HERNIA REPAIR  1970s   Right   INGUINAL LYMPH NODE BIOPSY Left 10/21/2023   Procedure: BIOPSY, LYMPH NODE, INGUINAL, OPEN;  Surgeon: Evonnie Dorothyann LABOR, DO;  Location: [facility];  Service: General;  Laterality: Left;   INGUINAL LYMPHADENECTOMY Left 12/29/2023   Procedure: LEFT INGUINOFEMORAL LYMPH NODE REMOVAL;  Surgeon: Eldonna Mays, MD;  Location: [facility];  Service: Gynecology;  Laterality: Left;  LEFT FEMORAL INGUINAL  LYMPH NODE DISECTION   ROBOTIC ASSISTED BILATERAL SALPINGO OOPHERECTOMY Bilateral 12/29/2023   Procedure: ROBOTIC ASSISTED LAPAROSCOPIC BILATERAL SALPINGO-OOPHORECTOMY, MINI-LAPAROTOMY FOR OMENTECTOMY;  Surgeon: Eldonna Mays, MD;  Location: [facility];  Service: Gynecology;  Laterality: Bilateral;   TOTAL MASTECTOMY Right 02/20/2021   Procedure: RIGHT TOTAL MASTECTOMY;  Surgeon: Ebbie Cough, MD;  [facility];  Service: General;  Laterality: Right;   VAGINAL HYSTERECTOMY  1980   Unilateral oophorectomy    Family History  Problem Relation Age of Onset   Heart disease Mother        also hypertension and asthma   Lung cancer Mother        dx 18s   Cervical cancer Mother        dx before 50   Alzheimer's disease Maternal Grandmother    Diabetes Half-Sister    Breast cancer Half-Sister 39   Multiple myeloma Nephew 53       war-related exposures   Colon cancer Neg Hx    Colon polyps Neg Hx    Endometrial cancer Neg Hx    Pancreatic cancer Neg Hx    Prostate cancer Neg  Hx    Ovarian cancer Neg Hx     Social History   Socioeconomic History   Marital status: Divorced    Spouse name: Not on file   Number of children: 3   Years of education: 12   Highest education level: High school graduate  Occupational History   Occupation: Retired    Comment: Factory work  Tobacco Use   Smoking status: Former    Current packs/day: 0.00    Average packs/day: 1 pack/day for 18.0 years (18.0 ttl pk-yrs)    Types: Cigarettes    Start date: 06/02/1965    Quit date: 06/09/1980    Years since quitting: 43.6   Smokeless tobacco: Never  Vaping Use   Vaping status: Never Used  Substance and Sexual Activity   Alcohol use: No    Alcohol/week: 0.0 standard drinks of alcohol   Drug use: No   Sexual activity: Not Currently  Other Topics Concern   Not on file  Social History Narrative   Lives with daughter    Right handed    Social Drivers of Health   Financial Resource Strain: Low Risk   (06/04/2021)   Overall Financial Resource Strain (CARDIA)    Difficulty of Paying Living Expenses: Not hard at all  Food Insecurity: No Food Insecurity (01/01/2024)   Hunger Vital Sign    Worried About Running Out of Food in the Last Year: Never true    Ran Out of Food in the Last Year: Never true  Transportation Needs: No Transportation Needs (01/01/2024)   PRAPARE - Administrator, Civil Service (Medical): No    Lack of Transportation (Non-Medical): No  Physical Activity: Inactive (06/04/2021)   Exercise Vital Sign    Days of Exercise per Week: 0 days    Minutes of Exercise per Session: 0 min  Stress: Stress Concern Present (06/04/2021)   [employer] of Occupational Health - Occupational Stress Questionnaire    Feeling of Stress : To some extent  Social Connections: Moderately Isolated (12/30/2023)   Social Connection and Isolation Panel    Frequency of Communication with Friends and Family: More than three times a week    Frequency of Social Gatherings with Friends and Family: More than three times a week    Attends Religious Services: More than 4 times per year    Active Member of [employer] or Organizations: No    Attends Banker Meetings: Never    Marital Status: Widowed    Current Medications:  Current Outpatient Medications:    amLODipine  (NORVASC ) 10 MG tablet, TAKE ONE TABLET BY MOUTH EVERYDAY AT BEDTIME, Disp: 90 tablet, Rfl: 3   apixaban  (ELIQUIS ) 2.5 MG TABS tablet, Take 1 tablet (2.5 mg total) by mouth 2 (two) times daily., Disp: 28 tablet, Rfl: 0   carvedilol  (COREG ) 6.25 MG tablet, TAKE ONE TABLET BY MOUTH BEFORE BREAKFAST and TAKE ONE TABLET BY MOUTH EVERYDAY AT BEDTIME, Disp: 60 tablet, Rfl: 2   Cholecalciferol (VITAMIN D3) 50 MCG (2000 UT) TABS, TAKE ONE TABLET BY MOUTH daily in addition TO THE 1,000unit FOR total of 3,000units daily (Patient not taking: Reported on 01/18/2024), Disp: 30 tablet, Rfl: 1   cyanocobalamin  (VITAMIN B12) 1000 MCG tablet,  Take 1 tablet (1,000 mcg total) by mouth daily. (Patient not taking: Reported on 01/18/2024), Disp: 90 tablet, Rfl: 2   docusate sodium  (COLACE) 100 MG capsule, Take 1 capsule (100 mg total) by mouth 2 (two) times daily. (Patient taking differently: Take 100  mg by mouth daily as needed.), Disp: 60 capsule, Rfl: 2   donepezil  (ARICEPT ) 5 MG tablet, Take 1 tablet (5 mg total) by mouth at bedtime. (Patient not taking: Reported on 01/18/2024), Disp: 30 tablet, Rfl: 3   feeding supplement (ENSURE ENLIVE / ENSURE PLUS) LIQD, Take 237 mLs by mouth 3 (three) times daily between meals. (Patient taking differently: Take 237 mLs by mouth 2 (two) times daily between meals.), Disp: 237 mL, Rfl: 12   ferrous sulfate  325 (65 FE) MG EC tablet, Take 1 tablet (325 mg total) by mouth every other day. (Patient not taking: Reported on 01/18/2024), Disp: 45 tablet, Rfl: 3   irbesartan -hydrochlorothiazide  (AVALIDE) 300-12.5 MG tablet, Take 1 tablet by mouth daily before breakfast., Disp: 90 tablet, Rfl: 3   metFORMIN  (GLUCOPHAGE ) 500 MG tablet, Take 1 tablet (500 mg total) by mouth at bedtime., Disp: 30 tablet, Rfl: 0   mirtazapine  (REMERON ) 7.5 MG tablet, TAKE ONE TABLET BY MOUTH AT BEDTIME OR DEPRESSION AND WEIGHT LOSS, Disp: 30 tablet, Rfl: 2   ONETOUCH ULTRA test strip, SMARTSIG:Strip(s), Disp: , Rfl:    rosuvastatin  (CRESTOR ) 20 MG tablet, TAKE ONE TABLET BY MOUTH EVERYDAY AT BEDTIME, Disp: 90 tablet, Rfl: 3   triamcinolone ointment (KENALOG) 0.5 %, Apply 1 application  topically 2 (two) times daily as needed (rash)., Disp: , Rfl:    polyethylene glycol powder (GLYCOLAX /MIRALAX ) 17 GM/SCOOP powder, Take by mouth once., Disp: , Rfl:   Review of Symptoms: Complete 10-system review is negative except as above in Interval History.  Physical Exam: BP 137/61 (BP Location: Left Arm, Patient Position: Sitting)   Pulse 70   Temp 98.4 F (36.9 C) (Oral)   Resp 18   Wt 130 lb 12.8 oz (59.3 kg)   SpO2 100%   BMI 24.71 kg/m   General: Alert, oriented, no acute distress. HEENT: Normocephalic, atraumatic. Neck symmetric without masses. Sclera anicteric.  Chest: Normal work of breathing. Clear to auscultation bilaterally.   Cardiovascular: Regular rate and rhythm, no murmurs. Abdomen: Soft, nontender.  Normoactive bowel sounds.  No masses appreciated.  Well-healing incisions. Extremities: Grossly normal range of motion.  Warm, well perfused.  No edema bilaterally. Left groin incision healing well with glue. Skin: No rashes or lesions noted.   Laboratory & Radiologic Studies: Surgical pathology (12/29/23): A. LEFT INGUINAL LYMPH NODE, EXCISION: - 1 lymph node negative for carcinoma - Separate soft tissue fragment with carcinoma. See Comment.  B. LEFT OVARY AND FALLOPIAN TUBE, RESECTION: - Serous tubular intraepithelial carcinoma (STIC), fallopian tube - Unremarkable ovary  C. RIGHT OVARY AND FALLOPIAN TUBE, RESECTION: - High-grade serous carcinoma of fallopian tube - Serous tubal intraepithelial carcinoma, fallopian tube - Unremarkable ovary - See oncology table  D. OMENTUM: - Benign fibroadipose tissue  E. STOMACH SEROSAL BIOPSY: - Benign fibroadipose tissue  COMMENT: The patient's history of carcinoma of GYN origin metastatic to inguinal lymph node is noted.  Focal areas of the right fallopian tube show high-grade serous carcinoma and serous tubal intraepithelial carcinoma (STIC).  A p53 immunohistochemical stain appears to show clonal loss of expression.  Serous tubal intraepithelial carcinoma is also noted in the left fallopian tube.  The findings are most consistent with high-grade serous carcinoma of fallopian tube origin. While no residual tumor is identified in the lymph node specimen, a separate soft tissue fragment with invasive carcinoma is present, of unclear significance. Additionally, size of metastatic deposit is estimated from the original inguinal lymph node specimen.  ([id number]). This case was peer reviewed  by Dr. Macie who agrees with the above diagnosis.   ONCOLOGY TABLE:   OVARY or FALLOPIAN TUBE or PRIMARY PERITONEUM: Resection  Procedure: Bilateral salpingo-oophorectomy Specimen Integrity: Intact Tumor Site: Right fallopian tube Tumor Size: Approximately 1 cm (estimated from number of blocks involved) Histologic Type: High-grade serous carcinoma Histologic Grade: High-grade Ovarian Surface Involvement: Not identified Fallopian Tube Surface Involvement: Not identified Implants: Not applicable Lymphatic and/or Vascular Invasion: Not identified Other Tissue/ Organ Involvement:-Not applicable Largest Extrapelvic Peritoneal Focus: Not applicable Peritoneal/Ascitic Fluid Involvement: Not applicable Chemotherapy Response Score (CRS): Not applicable Regional Lymph Nodes: See comment      Number of Nodes with Metastasis Greater than 10 mm: 1      Number of Nodes with Metastasis 10 mm or Less (excludes isolated tumor cells): 0      Number of Nodes with Isolated Tumor Cells (0.2 mm or less): 0      Number of Lymph Nodes Examined: 1 Distant Metastasis:      Distant Site(s) Involved: Not applicable Pathologic Stage Classification (pTNM, AJCC 8th Edition): pT1a, PN1b Ancillary Studies: Can be performed upon request Representative Tumor Block: C1, C8

## 2024-01-18 NOTE — Progress Notes (Signed)
 01/18/2024 Name: Nancy Liu MRN: [id number] DOB: 1941/03/10  Chief Complaint  Patient presents with   Medication Management    Nancy Liu is a 83 y.o. year old female who presented for a telephone visit.   They were referred to the pharmacist by their Case Management Team  for assistance in managing complex medication management.    Subjective: Patient is referred to pharmacy for questions/concerns regarding mirtazapine  7.5 mg. The patient's daughter [name], is who expressed the concern. She was available today to conduct a comprehensive medication review. She expresses concerns with mirtazapine , because the patient is experiencing excessive drowsiness. She reports the patient has self-discontinued the donepezil , and that the prescriber is aware.  Care Team: Primary Care Provider: Benjamine Aland, MD ; Next Scheduled Visit: 01/20/24   Medication Access/Adherence  Current Pharmacy:  [facility] - [place], [place] - [address] [place] [address] Phone: [phone] Fax: [phone]  DARRYLE LONG - [facility] [address] [place] [address] Phone: [phone] Fax: [phone]   Patient reports affordability concerns with their medications: No  Patient reports access/transportation concerns to their pharmacy: No Patient receives blister packaging from [facility] Patient reports adherence concerns with their medications:  No  [name] ensures patient takes medications as prescribed.   Medication Management:  Current adherence strategy: Compliance packaging - blister packs, patient's children (daughter and son) both assist with medications  Patient reports Good adherence to medications     Objective:  Lab Results  Component Value Date   HGBA1C 5.4 12/23/2023    Lab Results  Component Value Date   CREATININE 1.16 (H) 01/07/2024   BUN 31 (H) 01/07/2024   NA 140 01/07/2024   K 4.8  01/07/2024   CL 96 (L) 01/07/2024   CO2 30 01/07/2024    Lab Results  Component Value Date   CHOL 134 08/01/2010   HDL 40 08/01/2010   LDLCALC 68 08/01/2010   TRIG 132 08/01/2010   CHOLHDL 3.4 Ratio 08/01/2010    Medications Reviewed Today     Reviewed by Graylon Keen, [facility] (Pharmacist) on 01/18/24 at 1325  Med List Status: <None>   Medication Order Taking? Sig Documenting Provider Last Dose Status Informant  amLODipine  (NORVASC ) 10 MG tablet [id number] Yes TAKE ONE TABLET BY MOUTH EVERYDAY AT BEDTIME Alvan Dorn FALCON, MD  Active Family Member  apixaban  (ELIQUIS ) 2.5 MG TABS tablet [id number]  Take 1 tablet (2.5 mg total) by mouth 2 (two) times daily. Micheline Setter D, NP  Active   carvedilol  (COREG ) 6.25 MG tablet [id number] Yes TAKE ONE TABLET BY MOUTH BEFORE BREAKFAST and TAKE ONE TABLET BY MOUTH EVERYDAY AT BEDTIME Ricky Fines, MD  Active Family Member  Cholecalciferol (VITAMIN D3) 50 MCG (2000 UT) TABS [id number]  TAKE ONE TABLET BY MOUTH daily in addition TO THE 1,000unit FOR total of 3,000units daily  Patient not taking: Reported on 01/18/2024   Lamon Pleasant CHRISTELLA, PA-C  Active Family Member  cyanocobalamin  (VITAMIN B12) 1000 MCG tablet [id number]  Take 1 tablet (1,000 mcg total) by mouth daily.  Patient not taking: Reported on 01/18/2024   Davonna Siad, MD  Active Family Member  docusate sodium  (COLACE) 100 MG capsule [id number] Yes Take 1 capsule (100 mg total) by mouth 2 (two) times daily.  Patient taking differently: Take 100 mg by mouth daily as needed.   Pappayliou, Dorothyann LABOR, DO  Active Family Member  Med Note ALYNE SUZEN ORN   Tue Dec 29, 2023 10:51 AM) Not started   donepezil  (ARICEPT ) 5 MG tablet [id number]  Take 1 tablet (5 mg total) by mouth at bedtime.  Patient not taking: Reported on 01/18/2024   [name], DO  Active Family Member  feeding supplement (ENSURE ENLIVE / ENSURE PLUS) LIQD [id number] Yes Take 237 mLs by mouth 3 (three) times  daily between meals.  Patient taking differently: Take 237 mLs by mouth 2 (two) times daily between meals.   Maree Bracken D, DO  Active Family Member  ferrous sulfate  325 (65 FE) MG EC tablet [id number]  Take 1 tablet (325 mg total) by mouth every other day.  Patient not taking: Reported on 01/18/2024   Davonna Siad, MD  Active Family Member  irbesartan -hydrochlorothiazide  (AVALIDE) 300-12.5 MG tablet [id number] Yes Take 1 tablet by mouth daily before breakfast. Maree Bracken D, DO  Active Family Member  metFORMIN  (GLUCOPHAGE ) 500 MG tablet [id number] Yes Take 1 tablet (500 mg total) by mouth at bedtime. Maree Bracken D, DO  Active Family Member  mirtazapine  (REMERON ) 7.5 MG tablet [id number] Yes TAKE ONE TABLET BY MOUTH AT BEDTIME OR DEPRESSION AND WEIGHT LOSS Lamon Pleasant CHRISTELLA DEVONNA  Active   [facility] ULTRA test strip [id number] Yes SMARTSIG:Strip(s) [provider]  Active Family Member   Patient not taking:   Discontinued 01/18/24 1322 (No longer needed (for PRN medications))          Med Note ALYNE, SUZEN ORN   Tue Dec 29, 2023 10:53 AM) Not started  polyethylene glycol powder (GLYCOLAX /MIRALAX ) 17 GM/SCOOP powder [id number] Yes Take by mouth once. [provider]  Active   rosuvastatin  (CRESTOR ) 20 MG tablet [id number] Yes TAKE ONE TABLET BY MOUTH EVERYDAY AT BEDTIME Alvan Dorn FALCON, MD  Active Family Member   Patient not taking:   Discontinued 01/18/24 1325 (Patient Preference)          Med Note ALYNE, SUZEN ORN   Tue Dec 29, 2023 10:53 AM) Not started  triamcinolone ointment (KENALOG) 0.5 % [id number] Yes Apply 1 application  topically 2 (two) times daily as needed (rash). [provider]  Active Family Member           Med Note JERALYN DUNCANS A   Tue Oct 13, 2023 11:21 AM)    Med List Note Lesly, Richerd GRADE, [place] 01/01/24 1623): 01/01/24 spoke with daughter [name] and reviewed via AVS from hospital and updated   Pt's son Loman helps with meds [id number]              Assessment/Plan:   Medication Management: - Currently strategy sufficient to maintain appropriate adherence to prescribed medication regimen -     Follow Up Plan: ***  ***

## 2024-01-20 DIAGNOSIS — I129 Hypertensive chronic kidney disease with stage 1 through stage 4 chronic kidney disease, or unspecified chronic kidney disease: Secondary | ICD-10-CM | POA: Diagnosis not present

## 2024-01-20 DIAGNOSIS — F01A0 Vascular dementia, mild, without behavioral disturbance, psychotic disturbance, mood disturbance, and anxiety: Secondary | ICD-10-CM | POA: Diagnosis not present

## 2024-01-20 DIAGNOSIS — E1122 Type 2 diabetes mellitus with diabetic chronic kidney disease: Secondary | ICD-10-CM | POA: Diagnosis not present

## 2024-01-20 DIAGNOSIS — N189 Chronic kidney disease, unspecified: Secondary | ICD-10-CM | POA: Diagnosis not present

## 2024-01-20 DIAGNOSIS — E785 Hyperlipidemia, unspecified: Secondary | ICD-10-CM | POA: Diagnosis not present

## 2024-01-20 DIAGNOSIS — R251 Tremor, unspecified: Secondary | ICD-10-CM | POA: Diagnosis not present

## 2024-01-20 DIAGNOSIS — C578 Malignant neoplasm of overlapping sites of female genital organs: Secondary | ICD-10-CM | POA: Diagnosis not present

## 2024-01-21 NOTE — Progress Notes (Signed)
 Patient Care Team: Benjamine Aland, MD as PCP - General (Family Medicine) Alvan, Dorn FALCON, MD as PCP - Cardiology (Cardiology) [name], MD as Consulting Physician (Hematology) Ebbie Cough, MD as Consulting Physician (General Surgery) [name], MD as Consulting Physician (Radiation Oncology) [name], MD (Inactive) as Consulting Physician (Pulmonary Disease) [name], DO as Consulting Physician (Gastroenterology) [name], MD as Consulting Physician (Pulmonary Disease) [name], DO as Consulting Physician (Neurology) [name], NP as Nurse Practitioner (Cardiology) Eilleen Richerd GRADE, RN as VBCI Care Management  Clinic Day:  01/24/2024  Referring physician: Benjamine Aland, MD   CHIEF COMPLAINT:  CC: Metastatic high-grade serous ovarian carcinoma   ASSESSMENT & PLAN:   Assessment & Plan: Nancy Liu  is a 83 y.o. female with metastatic high-grade serous ovarian carcinoma  Assessment & Plan High grade serous carcinoma [facility]) Patient with recent excisional biopsy of inguinal lymph node consistent with high-grade serous carcinoma PET scan with no other sites of disease Identified lesion just above ovarian serous adenocarcinoma 90% probability CEA, CA125: Normal, CA 19-9: Slightly elevated S/p hysterectomy in 1980, unknown if she had oophorectomy at that time 12/2020: Germline mutation testing: Negative Discussed with tumor board and recommendation was to refer to GYN ONC for surgical evaluation S/p robotic assisted bilateral salpingo-oophorectomy, omentectomy, left inguinofemoral lymph node removal.  Pathology consistent with fallopian tube carcinoma.  - We discussed that considering patient has stage IV disease considering her metastasis to lymph node.  She would require adjuvant chemotherapy.  We discussed risk versus benefits of chemotherapy in detail.  Patient decided not get chemotherapy at this time. -We discussed other  options of surveillance including frequent examination with GYN ONC and imaging every 3 months.  Patient would like to do both of it at this time.  We discussed the possibility of spacing out the imaging in future if needed -As per patient's preference, we will repeat a PET scan in 3 months  Return to clinic 3 months with PET scan and labs Ductal carcinoma in situ (DCIS) of right breast Patient has a history of DCIS of right breast diagnosed in 2022 s/p mastectomy.  ER/PR positive.   Did not receive endocrine therapy. Mammogram 10/2022 with no evidence of disease in the left breast. Due for a repeat mammogram  Microcytic anemia Labs consistent with mild microcytic anemia secondary to iron  deficiency  - Continue oral iron  pills every other day  Will repeat labs in 3 months    The patient understands the plans discussed today and is in agreement with them.  She knows to contact our office if she develops concerns prior to her next appointment.  I provided  30 minutes of face-to-face time during this encounter and > 50% was spent counseling as documented under my assessment and plan.    LILLETTE Verneta SAUNDERS Teague,acting as a Neurosurgeon for [name], MD.,have documented all relevant documentation on the behalf of [name], MD,as directed by  [name], MD while in the presence of [name], MD.  I, [name] MD, have reviewed the above documentation for accuracy and completeness, and I agree with the above.     [name], MD  [facility] [place] - A DEPT OF [facility] [address] [place] [address] Dept: [phone] Dept Fax: [phone]   Orders Placed This Encounter  Procedures   NM PET Image Initial (PI) Skull Base To Thigh  Standing Status:   Future    Expected Date:   04/23/2024    Expiration Date:   01/21/2025    If indicated for the ordered procedure, I authorize the administration  of a radiopharmaceutical per Radiology protocol:   Yes    Preferred imaging location?:   [name]     ONCOLOGY HISTORY:   Oncology History  Ductal carcinoma in situ (DCIS) of right breast  01/01/2021 Mammogram   FINDINGS: Multiple circumscribed oval masses are noted in the MEDIAL portion of the RIGHT breast. Masses vary from 1-4 millimeters in diameter and appear contiguous, spanning 8.9 x 3.6 x 5.1 centimeters. There are faint calcifications spanning 4.2 x 1.9 x 3.5 centimeters in this same, segmental distribution.   01/14/2021 Cancer Staging   Staging form: Breast, AJCC 8th Edition - Clinical stage from 01/14/2021: Stage 0 (cTis (DCIS), cN0, cM0, G2, ER+, PR+, HER2: Not Assessed) - Signed by [name], MD on 01/21/2021 Stage prefix: Initial diagnosis Histologic grading system: 3 grade system   01/14/2021 Pathology Results   Diagnosis 1. Breast, right, needle core biopsy, anterior extent - DUCTAL CARCINOMA IN SITU, INTERMEDIATE GRADE WITH CALCIFICATIONS. SEE NOTE 2. Breast, right, needle core biopsy, posterior extent - DUCTAL CARCINOMA IN SITU, INTERMEDIATE GRADE WITH CALCIFICATIONS. SEE NOTE Diagnosis Note 1. and 2. DCIS measures 0.6 cm in part 1 and 0.4 cm in part 2.  1. PROGNOSTIC INDICATORS Results: IMMUNOHISTOCHEMICAL AND MORPHOMETRIC ANALYSIS PERFORMED MANUALLY Estrogen Receptor: >95%, POSITIVE, STRONG STAINING INTENSITY Progesterone Receptor: >95%, POSITIVE, STRONG STAINING INTENSITY     01/17/2021 Initial Diagnosis   Ductal carcinoma in situ (DCIS) of right breast   02/05/2021 Genetic Testing   Negative hereditary cancer genetic testing: no pathogenic variants detected in [employer] CustomNext-Cancer +RNAinsight Panel.  The report date is February 05, 2021.   The CustomNext-Cancer+RNAinsight panel offered by [employer] includes sequencing and rearrangement analysis for the following 47 genes:  APC, ATM, AXIN2, BARD1, BMPR1A, BRCA1, BRCA2, BRIP1, CDH1, CDK4, CDKN2A, CHEK2,  DICER1, EPCAM, GREM1, HOXB13, MEN1, MLH1, MSH2, MSH3, MSH6, MUTYH, NBN, NF1, NF2, NTHL1, PALB2, PMS2, POLD1, POLE, PTEN, RAD51C, RAD51D, RECQL, RET, SDHA, SDHAF2, SDHB, SDHC, SDHD, SMAD4, SMARCA4, STK11, TP53, TSC1, TSC2, and VHL.  RNA data is routinely analyzed for use in variant interpretation for all genes.   02/20/2021 Cancer Staging   Staging form: Breast, AJCC 8th Edition - Pathologic stage from 02/20/2021: No Stage Recommended (pTis (DCIS), pNX, cM1, G2, ER+, PR+, HER2: Not Assessed) - Signed by [name], MD on 03/12/2021 Stage prefix: Initial diagnosis Histologic grading system: 3 grade system Residual tumor (R): R0 - None   02/20/2021 Definitive Surgery   FINAL MICROSCOPIC DIAGNOSIS:   A. BREAST, RIGHT, MASTECTOMY:  - Multifocal intermediate grade ductal carcinoma in situ with calcifications.  - Atypical lobular hyperplasia.  - Margins are negative for carcinoma.  - Biopsy site (x2).  - See oncology table.    High grade serous carcinoma (HCC)  10/01/2023 PET scan   IMPRESSION: 1. Hypermetabolic left inguinal lymph node, maximum SUV 9.2. Given the high SUV, neoplastic involvement is a distinct possibility tissue sampling should be considered. 2. Multifocal accentuated activity in the colon is likely physiologic given the lack of correlate of CT findings, although technically nonspecific. 3. Right mastectomy.   10/21/2023 Pathology Results    FINAL MICROSCOPIC DIAGNOSIS:   A. LYMPH NODE, INGUINAL, BIOPSY:  - Metastatic poorly differentiated adenocarcinoma, see comment   Immunohistochemical stains show that the tumor cells are positive for  CK7 and  PAX8 with patchy, weak labeling for CK5/6 and p16.  Immunostains for CK20, CDX2, GATA3 and p63 are negative.  This immunoprofile is most  consistent with a gynecologic primary while differential diagnosis can  include a primary renal cell carcinoma but is less likely.   Immunohistochemical stain for p53 shows a clonal loss of expression  pattern, most consistent with metastatic high-grade serous carcinoma.    11/10/2023 Cancer Staging   Staging form: Ovary, AJCC 7th Edition - Clinical stage from 11/10/2023: FIGO Stage IVB, calculated as Stage IV (T1a, N1, M1) - Signed by Eldonna Mays, MD on 01/18/2024 Stage prefix: Initial diagnosis Source of metastatic specimen: Inguinal Lymph Node Histologic grade (G): G4 Lymph-vascular invasion (LVI): Presence of LVI unknown/indeterminate   11/13/2023 Initial Diagnosis   High grade serous carcinoma (HCC)   11/19/2023 Pathology Results   Cancer type ID: Ovary, 90% probability Serous adenocarcinoma     12/29/2023 Procedure   Robotic assisted BSO, omentectomy, left inguinofemoral lymph node removal   12/29/2023 Pathology Results   FINAL MICROSCOPIC DIAGNOSIS:   A. LEFT INGUINAL LYMPH NODE, EXCISION:  - 1 lymph node negative for carcinoma  - Separate soft tissue fragment with carcinoma.  B. LEFT OVARY AND FALLOPIAN TUBE, RESECTION:  - Serous tubular intraepithelial carcinoma (STIC), fallopian tube  - Unremarkable ovary   C. RIGHT OVARY AND FALLOPIAN TUBE, RESECTION:  - High-grade serous carcinoma of fallopian tube  - Serous tubal intraepithelial carcinoma, fallopian tube  - Unremarkable ovary  - See oncology table   COMMENT: The patient's history of carcinoma of GYN origin metastatic to inguinal lymph node is noted.  Focal areas of the right fallopian tube show high-grade serous carcinoma and serous tubal intraepithelial carcinoma (STIC).  A p53 immunohistochemical stain appears to show clonal loss of expression.  Serous tubal intraepithelial carcinoma is also noted in the left fallopian tube.  The findings are most consistent with high-grade serous carcinoma of fallopian tube origin.         Current Treatment: Surgery  INTERVAL HISTORY:   KARSTEN VAUGHN is here today for follow up. Patient is accompanied by her son today. She reported recovering from her surgery very  well.  She has no complaints today.  She denies abdominal pain, bleeding, loss of appetite, loss of weight.  Overall she is doing well.  We discussed the pathology findings in detail and that at this point she requires chemotherapy.  Patient wishes to not do any chemotherapy at this time but would opt for surveillance.  I have reviewed the past medical history, past surgical history, social history and family history with the patient and they are unchanged from previous note.  ALLERGIES:  is allergic to protonix  [pantoprazole  sodium].  MEDICATIONS:  Current Outpatient Medications  Medication Sig Dispense Refill   amLODipine  (NORVASC ) 10 MG tablet TAKE ONE TABLET BY MOUTH EVERYDAY AT BEDTIME 90 tablet 3   apixaban  (ELIQUIS ) 2.5 MG TABS tablet Take 1 tablet (2.5 mg total) by mouth 2 (two) times daily. 28 tablet 0   carvedilol  (COREG ) 6.25 MG tablet TAKE ONE TABLET BY MOUTH BEFORE BREAKFAST and TAKE ONE TABLET BY MOUTH EVERYDAY AT BEDTIME 60 tablet 2   Cholecalciferol (VITAMIN D3) 50 MCG (2000 UT) TABS TAKE ONE TABLET BY MOUTH daily in addition TO THE 1,000unit FOR total of 3,000units daily 30 tablet 1   cyanocobalamin  (VITAMIN B12) 1000 MCG tablet Take 1 tablet (1,000 mcg total) by mouth daily. 90 tablet 2   docusate sodium  (COLACE) 100 MG capsule  Take 1 capsule (100 mg total) by mouth 2 (two) times daily. (Patient taking differently: Take 100 mg by mouth daily as needed.) 60 capsule 2   donepezil  (ARICEPT ) 5 MG tablet Take 1 tablet (5 mg total) by mouth at bedtime. 30 tablet 3   feeding supplement (ENSURE ENLIVE / ENSURE PLUS) LIQD Take 237 mLs by mouth 3 (three) times daily between meals. (Patient taking differently: Take 237 mLs by mouth 2 (two) times daily between meals.) 237 mL 12   ferrous sulfate  325 (65 FE) MG EC tablet Take 1 tablet (325 mg total) by mouth every other day. 45 tablet 3   irbesartan -hydrochlorothiazide  (AVALIDE) 300-12.5 MG tablet Take 1 tablet by mouth daily before  breakfast. 90 tablet 3   metFORMIN  (GLUCOPHAGE ) 500 MG tablet Take 1 tablet (500 mg total) by mouth at bedtime. 30 tablet 0   mirtazapine  (REMERON ) 7.5 MG tablet TAKE ONE TABLET BY MOUTH AT BEDTIME OR DEPRESSION AND WEIGHT LOSS 30 tablet 2   ONETOUCH ULTRA test strip SMARTSIG:Strip(s)     polyethylene glycol powder (GLYCOLAX /MIRALAX ) 17 GM/SCOOP powder Take by mouth once.     rosuvastatin  (CRESTOR ) 20 MG tablet TAKE ONE TABLET BY MOUTH EVERYDAY AT BEDTIME 90 tablet 3   triamcinolone ointment (KENALOG) 0.5 % Apply 1 application  topically 2 (two) times daily as needed (rash).     No current facility-administered medications for this visit.    REVIEW OF SYSTEMS:   Constitutional: Denies fevers, chills or abnormal weight loss Eyes: Denies blurriness of vision Ears, nose, mouth, throat, and face: Denies mucositis or sore throat Respiratory: Denies cough, dyspnea or wheezes Cardiovascular: Denies palpitation, chest discomfort or lower extremity swelling Gastrointestinal:  Denies nausea, heartburn or change in bowel habits Skin: Denies abnormal skin rashes Lymphatics: Denies new lymphadenopathy or easy bruising Neurological:Denies numbness, tingling or new weaknesses Behavioral/Psych: Mood is stable, no new changes  All other systems were reviewed with the patient and are negative.   VITALS:  Blood pressure 133/72, pulse 79, temperature 98.2 F (36.8 C), temperature source Oral, resp. rate 18, weight 122 lb 1.6 oz (55.4 kg), SpO2 95%.  Wt Readings from Last 3 Encounters:  01/22/24 122 lb 1.6 oz (55.4 kg)  01/18/24 130 lb 12.8 oz (59.3 kg)  12/23/23 131 lb (59.4 kg)    Body mass index is 23.07 kg/m.  Performance status (ECOG): 2 - Symptomatic, <50% confined to bed  PHYSICAL EXAM:   GENERAL:alert, no distress and comfortable SKIN: skin color, texture, turgor are normal, no rashes or significant lesions LYMPH:  no palpable lymphadenopathy in the cervical, axillary or  inguinal LUNGS: clear to auscultation and percussion with normal breathing effort HEART: regular rate & rhythm and no murmurs  ABDOMEN:abdomen soft, non-tender and normal bowel sounds Musculoskeletal: No edema NEURO: alert & oriented x 3 with fluent speech   LABORATORY DATA:  I have reviewed the data as listed     Component Value Date/Time   NA 140 01/07/2024 1549   NA 141 07/03/2022 1411   K 4.8 01/07/2024 1549   CL 96 (L) 01/07/2024 1549   CO2 30 01/07/2024 1549   GLUCOSE 103 (H) 01/07/2024 1549   BUN 31 (H) 01/07/2024 1549   BUN 20 07/03/2022 1411   CREATININE 1.16 (H) 01/07/2024 1549   CREATININE 1.22 (H) 01/23/2021 0745   CALCIUM  9.4 01/07/2024 1549   PROT 7.3 12/23/2023 1110   ALBUMIN  4.2 12/23/2023 1110   AST 26 12/23/2023 1110   AST 24 01/23/2021 0745  ALT 13 12/23/2023 1110   ALT 17 01/23/2021 0745   ALKPHOS 60 12/23/2023 1110   BILITOT 0.7 12/23/2023 1110   BILITOT 0.6 01/23/2021 0745   GFRNONAA 47 (L) 01/07/2024 1549   GFRNONAA 45 (L) 01/23/2021 0745   GFRAA 53 (L) 12/13/2019 1020    Lab Results  Component Value Date   WBC 7.0 01/07/2024   NEUTROABS 5.4 11/10/2023   HGB 11.5 (L) 01/07/2024   HCT 36.5 01/07/2024   MCV 78.7 (L) 01/07/2024   PLT 335 01/07/2024      Chemistry      Component Value Date/Time   NA 140 01/07/2024 1549   NA 141 07/03/2022 1411   K 4.8 01/07/2024 1549   CL 96 (L) 01/07/2024 1549   CO2 30 01/07/2024 1549   BUN 31 (H) 01/07/2024 1549   BUN 20 07/03/2022 1411   CREATININE 1.16 (H) 01/07/2024 1549   CREATININE 1.22 (H) 01/23/2021 0745      Component Value Date/Time   CALCIUM  9.4 01/07/2024 1549   ALKPHOS 60 12/23/2023 1110   AST 26 12/23/2023 1110   AST 24 01/23/2021 0745   ALT 13 12/23/2023 1110   ALT 17 01/23/2021 0745   BILITOT 0.7 12/23/2023 1110   BILITOT 0.6 01/23/2021 0745      Latest Reference Range & Units 11/10/23 10:35  Iron  28 - 170 ug/dL 50  UIBC ug/dL 709  TIBC 749 - 549 ug/dL 659  Saturation  Ratios 10.4 - 31.8 % 15  Ferritin 11 - 307 ng/mL 71  Folate >5.9 ng/mL 11.4    RADIOGRAPHIC STUDIES: I have personally reviewed the radiological images as listed and agreed with the findings in the report  None new to review

## 2024-01-22 ENCOUNTER — Inpatient Hospital Stay: Admitting: Oncology

## 2024-01-22 VITALS — BP 133/72 | HR 79 | Temp 98.2°F | Resp 18 | Wt 122.1 lb

## 2024-01-22 DIAGNOSIS — C5700 Malignant neoplasm of unspecified fallopian tube: Secondary | ICD-10-CM | POA: Diagnosis not present

## 2024-01-22 DIAGNOSIS — D0511 Intraductal carcinoma in situ of right breast: Secondary | ICD-10-CM

## 2024-01-22 DIAGNOSIS — C569 Malignant neoplasm of unspecified ovary: Secondary | ICD-10-CM

## 2024-01-22 DIAGNOSIS — D509 Iron deficiency anemia, unspecified: Secondary | ICD-10-CM

## 2024-01-24 NOTE — Assessment & Plan Note (Addendum)
 Patient has a history of DCIS of right breast diagnosed in 2022 s/p mastectomy.  ER/PR positive.   Did not receive endocrine therapy. Mammogram 10/2022 with no evidence of disease in the left breast. Due for a repeat mammogram

## 2024-01-24 NOTE — Assessment & Plan Note (Addendum)
 Labs consistent with mild microcytic anemia secondary to iron  deficiency  - Continue oral iron  pills every other day  Will repeat labs in 3 months

## 2024-01-24 NOTE — Assessment & Plan Note (Addendum)
 Patient with recent excisional biopsy of inguinal lymph node consistent with high-grade serous carcinoma PET scan with no other sites of disease Identified lesion just above ovarian serous adenocarcinoma 90% probability CEA, CA125: Normal, CA 19-9: Slightly elevated S/p hysterectomy in 1980, unknown if she had oophorectomy at that time 12/2020: Germline mutation testing: Negative Discussed with tumor board and recommendation was to refer to GYN ONC for surgical evaluation S/p robotic assisted bilateral salpingo-oophorectomy, omentectomy, left inguinofemoral lymph node removal.  Pathology consistent with fallopian tube carcinoma.  - We discussed that considering patient has stage IV disease considering her metastasis to lymph node.  She would require adjuvant chemotherapy.  We discussed risk versus benefits of chemotherapy in detail.  Patient decided not get chemotherapy at this time. -We discussed other options of surveillance including frequent examination with GYN ONC and imaging every 3 months.  Patient would like to do both of it at this time.  We discussed the possibility of spacing out the imaging in future if needed -As per patient's preference, we will repeat a PET scan in 3 months  Return to clinic 3 months with PET scan and labs

## 2024-01-25 ENCOUNTER — Other Ambulatory Visit: Payer: Self-pay

## 2024-01-25 ENCOUNTER — Telehealth: Payer: Self-pay

## 2024-01-25 ENCOUNTER — Other Ambulatory Visit: Payer: Self-pay | Admitting: Oncology

## 2024-01-25 LAB — SURGICAL PATHOLOGY

## 2024-01-25 NOTE — Transitions of Care (Post Inpatient/ED Visit) (Signed)
 Transition of Care week 4  Visit Note  01/25/2024  Name: Nancy Liu MRN: [id number]          DOB: [birth year]  Situation: Patient enrolled in [facility] 30-day program. Visit completed with daughter and patient by telephone.   Background:   Initial Transition Care Management Follow-up Telephone Call    Past Medical History:  Diagnosis Date   Anxiety    Arteriosclerotic cardiovascular disease (ASCVD) 2001   Bare-metal stent placed in the right coronary artery in 12/01; residual 50% lesion of the first diagonal and mid LAD   Arthritis    Breast cancer (HCC)    right 2021   Cerebrovascular disease    Chronic kidney disease    COPD (chronic obstructive pulmonary disease) (HCC)    Coronary artery disease    Stent   Cyst, dermoid, arm, left 03/26/2018   Depression    Diabetes mellitus    excellent control with a low-dose of a single oral agent   Family history of breast cancer 01/23/2021   Family history of ovarian cancer 01/23/2021   GERD (gastroesophageal reflux disease)    with ulcers   History of right coronary artery stent placement 2000   Hyperlipidemia    Hypertension    Sleep apnea    no cpap   Thalassemia minor    Tobacco abuse, in remission    35 pack years; Quit in 1980    Assessment: Patient Reported Symptoms: Cognitive Cognitive Status: Able to follow simple commands, Struggling with memory recall, Normal speech and language skills      Neurological Neurological Review of Symptoms: No symptoms reported Neurological Management Strategies: Activity, Adequate rest, Medication therapy Neurological Self-Management Outcome: 4 (good)  HEENT HEENT Symptoms Reported: No symptoms reported HEENT Management Strategies: Medication therapy    Cardiovascular Cardiovascular Symptoms Reported: No symptoms reported Cardiovascular Management Strategies: Medication therapy Cardiovascular Self-Management Outcome: 4 (good)  Respiratory Respiratory Symptoms Reported: No  symptoms reported    Endocrine Endocrine Symptoms Reported: No symptoms reported Is patient diabetic?: Yes Is patient checking blood sugars at home?: No Endocrine Self-Management Outcome: 4 (good)  Gastrointestinal Gastrointestinal Symptoms Reported: No symptoms reported Additional Gastrointestinal Details: taking Miralax  Gastrointestinal Management Strategies: Activity, Medication therapy Gastrointestinal Self-Management Outcome: 3 (uncertain)    Genitourinary Genitourinary Symptoms Reported: No symptoms reported Genitourinary Management Strategies: Incontinence garment/pad Genitourinary Self-Management Outcome: 4 (good)  Integumentary Integumentary Symptoms Reported: Wound Skin Management Strategies: Medication therapy Skin Self-Management Outcome: 4 (good)  Musculoskeletal Musculoskelatal Symptoms Reviewed: Weakness Additional Musculoskeletal Details: Strenght improving Musculoskeletal Management Strategies: Activity, Medical device, Medication therapy Musculoskeletal Self-Management Outcome: 4 (good)      Psychosocial Psychosocial Symptoms Reported: No symptoms reported Additional Psychological Details: Ongoing dementia Behavioral Management Strategies: Medication therapy Behavioral Health Self-Management Outcome: 4 (good)       There were no vitals filed for this visit.  Medications Reviewed Today     Reviewed by Eilleen Richerd GRADE, RN (Registered Nurse) on 01/25/24 at 1544  Med List Status: <None>   Medication Order Taking? Sig Documenting Provider Last Dose Status Informant  amLODipine  (NORVASC ) 10 MG tablet [id number]  TAKE ONE TABLET BY MOUTH EVERYDAY AT BEDTIME Alvan Dorn FALCON, MD  Active Family Member  apixaban  (ELIQUIS ) 2.5 MG TABS tablet [id number]  Take 1 tablet (2.5 mg total) by mouth 2 (two) times daily. Micheline Setter D, NP  Active   carvedilol  (COREG ) 6.25 MG tablet [id number]  TAKE ONE TABLET BY MOUTH BEFORE BREAKFAST and TAKE ONE TABLET BY MOUTH EVERYDAY AT  BEDTIME Ricky Fines, MD  Active Family Member  Cholecalciferol (VITAMIN D3) 50 MCG (2000 UT) TABS [id number]  TAKE ONE TABLET BY MOUTH daily in addition TO THE 1,000unit FOR total of 3,000units daily Pennington, Rebekah M, PA-C  Active Family Member  cyanocobalamin  (VITAMIN B12) 1000 MCG tablet [id number]  Take 1 tablet (1,000 mcg total) by mouth daily. Davonna Siad, MD  Active Family Member  docusate sodium  (COLACE) 100 MG capsule [id number]  Take 1 capsule (100 mg total) by mouth 2 (two) times daily.  Patient taking differently: Take 100 mg by mouth daily as needed.   Pappayliou, Dorothyann LABOR, DO  Active Family Member           Med Note ALYNE, SUZEN ORN   Tue Dec 29, 2023 10:51 AM) Not started   donepezil  (ARICEPT ) 5 MG tablet [id number]  Take 1 tablet (5 mg total) by mouth at bedtime. [name], DO  Active Family Member  feeding supplement (ENSURE ENLIVE / ENSURE PLUS) LIQD [id number]  Take 237 mLs by mouth 3 (three) times daily between meals.  Patient taking differently: Take 237 mLs by mouth 2 (two) times daily between meals.   Maree Bracken D, DO  Active Family Member  ferrous sulfate  325 (65 FE) MG EC tablet [id number]  Take 1 tablet (325 mg total) by mouth every other day. Davonna Siad, MD  Active Family Member  irbesartan -hydrochlorothiazide  (AVALIDE) 300-12.5 MG tablet [id number]  Take 1 tablet by mouth daily before breakfast. Maree Bracken D, DO  Active Family Member  metFORMIN  (GLUCOPHAGE ) 500 MG tablet [id number]  Take 1 tablet (500 mg total) by mouth at bedtime. Maree Bracken D, DO  Active Family Member  mirtazapine  (REMERON ) 7.5 MG tablet [id number]  TAKE ONE TABLET BY MOUTH AT BEDTIME OR DEPRESSION AND WEIGHT LOSS Lamon Pleasant CHRISTELLA DEVONNA  Active   [facility] ULTRA test strip [id number]  SMARTSIG:Strip(s) [provider]  Active Family Member  polyethylene glycol powder (GLYCOLAX /MIRALAX ) 17 GM/SCOOP powder [id number]  Take by mouth once. [provider]  Active    rosuvastatin  (CRESTOR ) 20 MG tablet [id number]  TAKE ONE TABLET BY MOUTH EVERYDAY AT BEDTIME Alvan Dorn FALCON, MD  Active Family Member  triamcinolone ointment (KENALOG) 0.5 % [id number]  Apply 1 application  topically 2 (two) times daily as needed (rash).  Patient not taking: Reported on 01/25/2024   [provider]  Active Family Member           Med Note JERALYN DUNCANS A   Tue Oct 13, 2023 11:21 AM)    Med List Note Lesly, Richerd GRADE, [place] 01/01/24 1623): 01/01/24 spoke with daughter [name] and reviewed via AVS from hospital and updated   Pt's son Loman helps with meds [phone]            Recommendation:   Continue Current Plan of Care  Follow Up Plan:   Ongoing follow up with Oncology team, pharmacist, and home health regarding rolling walker with seat and other recommendations in the home for patient safety.  Explained by PT and this writer that the rolling walker with seat might not be covered since she received a walker at prior discharge was reiterated.  Richerd Fish, RN, BSN, CCM [facility], [facility] RN Care Manager Direct Dial: [phone]

## 2024-01-25 NOTE — Progress Notes (Signed)
 Gynecologic Oncology Multi-Disciplinary Disposition Conference Note  Date of the Conference: 01/25/2024  Patient Name: Nancy Liu  Primary GYN Oncologist: Dr. 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 Recommendation is for adjuvant chemotherapy, however patient would like close observation.   This Multidisciplinary conference took place involving physicians from Gynecologic Oncology, Medical Oncology, Radiation Oncology, Pathology, Radiology along with the Gynecologic Oncology Nurse Practitioner and Gynecologic Oncology Nurse Navigator.  Comprehensive assessment of the patient's malignancy, staging, need for surgery, chemotherapy, radiation therapy, and need for further testing were reviewed. Supportive measures, both inpatient and following discharge were also discussed. The recommended plan of care is documented. Greater than 35 minutes were spent correlating and coordinating this patient's care.

## 2024-01-26 DIAGNOSIS — H47233 Glaucomatous optic atrophy, bilateral: Secondary | ICD-10-CM | POA: Diagnosis not present

## 2024-01-26 DIAGNOSIS — H401131 Primary open-angle glaucoma, bilateral, mild stage: Secondary | ICD-10-CM | POA: Diagnosis not present

## 2024-01-26 DIAGNOSIS — H2513 Age-related nuclear cataract, bilateral: Secondary | ICD-10-CM | POA: Diagnosis not present

## 2024-01-26 DIAGNOSIS — H04123 Dry eye syndrome of bilateral lacrimal glands: Secondary | ICD-10-CM | POA: Diagnosis not present

## 2024-02-03 ENCOUNTER — Telehealth: Payer: Self-pay

## 2024-02-03 ENCOUNTER — Other Ambulatory Visit: Payer: Self-pay

## 2024-02-03 DIAGNOSIS — D0511 Intraductal carcinoma in situ of right breast: Secondary | ICD-10-CM

## 2024-02-03 DIAGNOSIS — C763 Malignant neoplasm of pelvis: Secondary | ICD-10-CM

## 2024-02-03 DIAGNOSIS — R419 Unspecified symptoms and signs involving cognitive functions and awareness: Secondary | ICD-10-CM

## 2024-02-03 DIAGNOSIS — E119 Type 2 diabetes mellitus without complications: Secondary | ICD-10-CM

## 2024-02-03 NOTE — Patient Instructions (Signed)
 Visit Information  Thank you for taking time to visit with me today. Please don't hesitate to contact me if I can be of assistance to you before our next scheduled telephone appointment.  Our next appointment is: Completed 30 day TOC program and referred to Longitudinal Team.  Following is a copy of your care plan:   Goals Addressed             This Visit's Progress    COMPLETED: [facility]I Transitions of Care (TOC) Care Plan   On track    Problems: (Updated and reviewed with patient and daughter [name] 9/325 Richerd Fish, RN) Recent Hospitalization for treatment of Serous Carcinoma of female pelvis Diabetes Home Health services barrier: has not called yet, Knowledge Deficit Related to medications for post hospital, No Hospital Follow Up Provider appointment daughter states she will make appointment with PCP, and post op review of s/s of infection or needs. 01/07/24 PCP office to call daughter back because out of town, daughter to follow up with specialist 01/15/24 Patient and daughter wanting to pause vitamins and medications.  Patient is feeling medications causing to sleep too much in the day and she feels some medications are causing her to awake during the night and going to the door to check on her grandkids and she realizes that it's not true.  01/25/24 Ongoing pharmacy follow up, working with on medication concerns and follow up for eczema medications cost.  Goal:  Over the next 30 days, the patient will not experience hospital readmission  Interventions:   Surgery (Serous Carcinoma of femal pelvis): Evaluation of current treatment plan related to abdominal surgery assessed patient/caregiver understanding of surgical procedure   reviewed post-operative instructions with patient/caregiver addressed questions about post - surgical incision care  reviewed medications with patient and addressed questions reviewed scheduled provider appointments with patient/daughter [name] confirmed  availability of transportation to all appointments with daughter [name], has several  Daughter having difficulty with doing CBG and desires CBG monitor like Dexacam, to follow up with PCP - Dr. Benjamine on 01/20/24 3:30 pm appointment Pharmacy referral made to assist patient and family with ongoing medication management and assessment. 01/25/24 Follow up with pharmacy regarding alternative to triamcinolone ointment for eczema due to cost  Patient Self Care Activities:  Attend all scheduled provider appointments Call pharmacy for medication refills 3-7 days in advance of running out of medications Call provider office for new concerns or questions  Notify RN Care Manager of [facility] call rescheduling needs Participate in Transition of Care Program/Attend TOC scheduled calls Take medications as prescribed   01/07/24 Family arranged for lift chair which arrived today. 01/15/24 Spoke with daughter regarding getting alarm or alert system to let them know when patient is getting up to door at night.  Verbalized understanding using the teach back method. 01/25/24 Ongoing support for caregiver needs  Plan:  The care management team will reach out to the patient again over the next 5-10 business  days. The patient has been provided with contact information for the care management team and has been advised to call with any health related questions or concerns.  Daughter will follow up on labs to be drawn today also follow up with PCP office for appointment with them again. 01/15/24 Patient and daughter to work with Pharmacist on needs and for possible side effects of medications.(Pharmacist appointment was 01/18/24) 02/03/24 Ongoing follow up for glucose  follow up discussed with PCP, daughter would like more information on the Dexcom since the family  does not feel comfortable with the finger sticks and not consistently checking it at home.  Will refer for longitudinal complex care management RN, as the family desires  ongoing health education and care needs with mother's progressive diagnosis of Serous Carcinoma        Patient verbalizes understanding of instructions and care plan provided today and agrees to view in MyChart. Active MyChart status and patient understanding of how to access instructions and care plan via MyChart confirmed with patient.     The patient has been provided with contact information for the care management team and has been advised to call with any health related questions or concerns.  The Central Pharmacy team will follow up with the patient and will provide direct communication to the PCP for this patient.  Longitudinal Complex Care Management Team to follow.  Please call the care guide team at [phone] if you need to cancel or reschedule your appointment.   Please call the [place]  National Suicide Prevention Lifeline: (902) 527-1919 or TTY: 614-190-8552 TTY 743-500-7866) to talk to a trained counselor if you are experiencing a Mental Health or Behavioral Health Crisis or need someone to talk to. Richerd Fish, RN, BSN, CCM [facility], [facility] RN Care Manager Direct Dial: [phone]

## 2024-02-03 NOTE — Transitions of Care (Post Inpatient/ED Visit) (Unsigned)
 Transition of Care 5  Visit Note  02/03/2024  Name: Nancy Liu MRN: [id number]          DOB: [birth year]  Situation: Patient enrolled in [facility] 30-day program. Visit completed with patient and daughter [name] by telephone.   Background:   Initial Transition Care Management Follow-up Telephone Call    Past Medical History:  Diagnosis Date   Anxiety    Arteriosclerotic cardiovascular disease (ASCVD) 2001   Bare-metal stent placed in the right coronary artery in 12/01; residual 50% lesion of the first diagonal and mid LAD   Arthritis    Breast cancer (HCC)    right 2021   Cerebrovascular disease    Chronic kidney disease    COPD (chronic obstructive pulmonary disease) (HCC)    Coronary artery disease    Stent   Cyst, dermoid, arm, left 03/26/2018   Depression    Diabetes mellitus    excellent control with a low-dose of a single oral agent   Family history of breast cancer 01/23/2021   Family history of ovarian cancer 01/23/2021   GERD (gastroesophageal reflux disease)    with ulcers   History of right coronary artery stent placement 2000   Hyperlipidemia    Hypertension    Sleep apnea    no cpap   Thalassemia minor    Tobacco abuse, in remission    35 pack years; Quit in 1980    Assessment: Patient Reported Symptoms: Cognitive Cognitive Status: Able to follow simple commands, Alert and oriented to person, place, and time, Normal speech and language skills, Requires Assistance Decision Making, Struggling with memory recall      Neurological Neurological Review of Symptoms: Vision changes (when reading words coming together) Neurological Management Strategies: Activity, Adequate rest, Medication therapy Neurological Self-Management Outcome: 4 (good)  HEENT HEENT Symptoms Reported: No symptoms reported HEENT Management Strategies: Medication therapy    Cardiovascular Cardiovascular Symptoms Reported: No symptoms reported Does patient have uncontrolled  Hypertension?: Yes Is patient checking Blood Pressure at home?: Yes Patient's Recent BP reading at home: 130/72 Cardiovascular Management Strategies: Medication therapy Cardiovascular Self-Management Outcome: 4 (good)  Respiratory Respiratory Symptoms Reported: No symptoms reported Other Respiratory Symptoms: short of breath with exercise Respiratory Management Strategies: Adequate rest, Exercise, Medication therapy Respiratory Self-Management Outcome: 4 (good)  Endocrine Endocrine Symptoms Reported: No symptoms reported Is patient diabetic?: Yes Is patient checking blood sugars at home?: No Endocrine Self-Management Outcome: 3 (uncertain)  Gastrointestinal Gastrointestinal Symptoms Reported: No symptoms reported Additional Gastrointestinal Details: taking Miralax  as needed Gastrointestinal Management Strategies: Activity, Diet modification, Medication therapy Gastrointestinal Self-Management Outcome: 3 (uncertain)    Genitourinary Genitourinary Symptoms Reported: No symptoms reported Genitourinary Management Strategies: Incontinence garment/pad  Integumentary Integumentary Symptoms Reported: No symptoms reported Additional Integumentary Details: healed Skin Management Strategies: Medication therapy Skin Self-Management Outcome: 3 (uncertain)  Musculoskeletal Musculoskelatal Symptoms Reviewed: Weakness Additional Musculoskeletal Details: improving Musculoskeletal Management Strategies: Activity, Medical device, Medication therapy Musculoskeletal Self-Management Outcome: 4 (good) Falls in the past year?: No    Psychosocial Psychosocial Symptoms Reported: No symptoms reported Additional Psychological Details: ongoing dementia Behavioral Management Strategies: Medication therapy Behavioral Health Self-Management Outcome: 3 (uncertain)       There were no vitals filed for this visit.  Medications Reviewed Today     Reviewed by Eilleen Richerd GRADE, RN (Registered Nurse) on 02/03/24  at 1018  Med List Status: <None>   Medication Order Taking? Sig Documenting Provider Last Dose Status Informant  amLODipine  (NORVASC ) 10 MG tablet [id number] Yes TAKE ONE  TABLET BY MOUTH EVERYDAY AT BEDTIME Alvan Dorn FALCON, MD  Active Family Member  apixaban  (ELIQUIS ) 2.5 MG TABS tablet [id number]  Take 1 tablet (2.5 mg total) by mouth 2 (two) times daily.  Patient not taking: Reported on 02/03/2024   Cross, Melissa D, NP  Active   carvedilol  (COREG ) 6.25 MG tablet [id number] Yes TAKE ONE TABLET BY MOUTH BEFORE BREAKFAST and TAKE ONE TABLET BY MOUTH EVERYDAY AT BEDTIME Ricky Fines, MD  Active Family Member  Cholecalciferol (VITAMIN D3) 50 MCG (2000 UT) TABS [id number] Yes TAKE ONE TABLET BY MOUTH daily in addition TO THE 1,000unit FOR total of 3,000units daily Pennington, Rebekah M, PA-C  Active Family Member  cyanocobalamin  (VITAMIN B12) 1000 MCG tablet [id number] Yes Take 1 tablet (1,000 mcg total) by mouth daily. Davonna Siad, MD  Active Family Member  docusate sodium  (COLACE) 100 MG capsule [id number]  Take 1 capsule (100 mg total) by mouth 2 (two) times daily.  Patient taking differently: Take 100 mg by mouth daily as needed.   Pappayliou, Dorothyann LABOR, DO  Active Family Member           Med Note ALYNE, SUZEN ORN   Tue Dec 29, 2023 10:51 AM) Not started   donepezil  (ARICEPT ) 5 MG tablet [id number] Yes Take 1 tablet (5 mg total) by mouth at bedtime. [name], DO  Active Family Member  feeding supplement (ENSURE ENLIVE / ENSURE PLUS) LIQD [id number]  Take 237 mLs by mouth 3 (three) times daily between meals.  Patient taking differently: Take 237 mLs by mouth 2 (two) times daily between meals.   Maree Bracken D, DO  Active Family Member  ferrous sulfate  325 (65 FE) MG EC tablet [id number]  Take 1 tablet (325 mg total) by mouth every other day.  Patient not taking: Reported on 02/03/2024   Davonna Siad, MD  Active Family Member  furosemide (LASIX) 20 MG tablet [id number] Yes Take 20 mg by  mouth 2 (two) times daily. [provider]  Active            Med Note LESLY, RICHERD CINDERELLA Kitchens Jan 25, 2024  3:49 PM) Started today  irbesartan -hydrochlorothiazide  (AVALIDE) 300-12.5 MG tablet [id number] Yes Take 1 tablet by mouth daily before breakfast. Maree Bracken D, DO  Active Family Member  metFORMIN  (GLUCOPHAGE ) 500 MG tablet [id number] Yes Take 1 tablet (500 mg total) by mouth at bedtime. Maree Bracken D, DO  Active Family Member  mirtazapine  (REMERON ) 7.5 MG tablet [id number] Yes TAKE ONE TABLET BY MOUTH AT BEDTIME OR DEPRESSION AND WEIGHT LOSS Lamon Pleasant CHRISTELLA DEVONNA  Active   [facility] ULTRA test strip [id number] Yes SMARTSIG:Strip(s) [provider]  Active Family Member  polyethylene glycol powder (GLYCOLAX /MIRALAX ) 17 GM/SCOOP powder [id number] Yes Take by mouth once. [provider]  Active   rosuvastatin  (CRESTOR ) 20 MG tablet [id number] Yes TAKE ONE TABLET BY MOUTH EVERYDAY AT BEDTIME Alvan Dorn FALCON, MD  Active Family Member  triamcinolone ointment (KENALOG) 0.5 % [id number]  Apply 1 application  topically 2 (two) times daily as needed (rash).  Patient not taking: Reported on 01/25/2024   [provider]  Active Family Member           Med Note JERALYN DUNCANS A   Tue Oct 13, 2023 11:21 AM)    Med List Note LESLY, RICHERD CINDERELLA, [place] 01/01/24 1623): 01/01/24 spoke with daughter [name] and reviewed via AVS from hospital and updated   Pt's son Loman helps with  meds [phone]            Recommendation:   Referral to: Longitudinal Team - RN and Pharmacy  Follow Up Plan:   Referral to RN Case Manager Pharmacist Closing From:  Transitions of Care Program Patient has met all care management goals. Care Management case will be closed. Patient has been provided contact information should new needs arise.   Richerd Fish, RN, BSN, CCM [facility], [facility] RN Care Manager Direct Dial:  [phone]

## 2024-02-03 NOTE — Transitions of Care (Post Inpatient/ED Visit) (Deleted)
 Transition of Care Week 5  Visit Note  02/03/2024  Name: Nancy Liu MRN: [id number]          DOB: [birth year]  Situation: Patient enrolled in [facility] 30-day program. Visit completed with patient and [name] by telephone.   Background:   Initial Transition Care Management Follow-up Telephone Call    Past Medical History:  Diagnosis Date   Anxiety    Arteriosclerotic cardiovascular disease (ASCVD) 2001   Bare-metal stent placed in the right coronary artery in 12/01; residual 50% lesion of the first diagonal and mid LAD   Arthritis    Breast cancer (HCC)    right 2021   Cerebrovascular disease    Chronic kidney disease    COPD (chronic obstructive pulmonary disease) (HCC)    Coronary artery disease    Stent   Cyst, dermoid, arm, left 03/26/2018   Depression    Diabetes mellitus    excellent control with a low-dose of a single oral agent   Family history of breast cancer 01/23/2021   Family history of ovarian cancer 01/23/2021   GERD (gastroesophageal reflux disease)    with ulcers   History of right coronary artery stent placement 2000   Hyperlipidemia    Hypertension    Sleep apnea    no cpap   Thalassemia minor    Tobacco abuse, in remission    35 pack years; Quit in 1980    Assessment: Patient Reported Symptoms: Cognitive Cognitive Status: Able to follow simple commands, Alert and oriented to person, place, and time, Normal speech and language skills, Requires Assistance Decision Making, Struggling with memory recall      Neurological Neurological Review of Symptoms: Vision changes (when reading words coming together) Neurological Management Strategies: Activity, Adequate rest, Medication therapy Neurological Self-Management Outcome: 4 (good)  HEENT HEENT Symptoms Reported: No symptoms reported HEENT Management Strategies: Medication therapy    Cardiovascular Cardiovascular Symptoms Reported: No symptoms reported Does patient have uncontrolled Hypertension?:  Yes Is patient checking Blood Pressure at home?: Yes Patient's Recent BP reading at home: 130/72 Cardiovascular Management Strategies: Medication therapy Cardiovascular Self-Management Outcome: 4 (good)  Respiratory Respiratory Symptoms Reported: No symptoms reported Other Respiratory Symptoms: short of breath with exercise Respiratory Management Strategies: Adequate rest, Exercise, Medication therapy Respiratory Self-Management Outcome: 4 (good)  Endocrine Endocrine Symptoms Reported: No symptoms reported Is patient diabetic?: Yes Is patient checking blood sugars at home?: No Endocrine Self-Management Outcome: 3 (uncertain)  Gastrointestinal Gastrointestinal Symptoms Reported: No symptoms reported Additional Gastrointestinal Details: taking Miralax  as needed Gastrointestinal Management Strategies: Activity, Diet modification, Medication therapy Gastrointestinal Self-Management Outcome: 3 (uncertain)    Genitourinary Genitourinary Symptoms Reported: No symptoms reported Genitourinary Management Strategies: Incontinence garment/pad  Integumentary Integumentary Symptoms Reported: No symptoms reported Additional Integumentary Details: healed Skin Management Strategies: Medication therapy Skin Self-Management Outcome: 3 (uncertain)  Musculoskeletal Musculoskelatal Symptoms Reviewed: Weakness Additional Musculoskeletal Details: improving Musculoskeletal Management Strategies: Activity, Medical device, Medication therapy Musculoskeletal Self-Management Outcome: 4 (good) Falls in the past year?: No    Psychosocial Psychosocial Symptoms Reported: No symptoms reported Additional Psychological Details: ongoing dementia Behavioral Management Strategies: Medication therapy Behavioral Health Self-Management Outcome: 3 (uncertain)       There were no vitals filed for this visit.  Medications Reviewed Today     Reviewed by Eilleen Richerd GRADE, RN (Registered Nurse) on 02/03/24 at 1018  Med  List Status: <None>   Medication Order Taking? Sig Documenting Provider Last Dose Status Informant  amLODipine  (NORVASC ) 10 MG tablet [id number] Yes TAKE ONE  TABLET BY MOUTH EVERYDAY AT BEDTIME Alvan Dorn FALCON, MD  Active Family Member  apixaban  (ELIQUIS ) 2.5 MG TABS tablet [id number]  Take 1 tablet (2.5 mg total) by mouth 2 (two) times daily.  Patient not taking: Reported on 02/03/2024   Cross, Melissa D, NP  Active   carvedilol  (COREG ) 6.25 MG tablet [id number] Yes TAKE ONE TABLET BY MOUTH BEFORE BREAKFAST and TAKE ONE TABLET BY MOUTH EVERYDAY AT BEDTIME Ricky Fines, MD  Active Family Member  Cholecalciferol (VITAMIN D3) 50 MCG (2000 UT) TABS [id number] Yes TAKE ONE TABLET BY MOUTH daily in addition TO THE 1,000unit FOR total of 3,000units daily Pennington, Rebekah M, PA-C  Active Family Member  cyanocobalamin  (VITAMIN B12) 1000 MCG tablet [id number] Yes Take 1 tablet (1,000 mcg total) by mouth daily. Davonna Siad, MD  Active Family Member  docusate sodium  (COLACE) 100 MG capsule [id number]  Take 1 capsule (100 mg total) by mouth 2 (two) times daily.  Patient taking differently: Take 100 mg by mouth daily as needed.   Pappayliou, Dorothyann LABOR, DO  Active Family Member           Med Note ALYNE, SUZEN ORN   Tue Dec 29, 2023 10:51 AM) Not started   donepezil  (ARICEPT ) 5 MG tablet [id number] Yes Take 1 tablet (5 mg total) by mouth at bedtime. [name], DO  Active Family Member  feeding supplement (ENSURE ENLIVE / ENSURE PLUS) LIQD [id number]  Take 237 mLs by mouth 3 (three) times daily between meals.  Patient taking differently: Take 237 mLs by mouth 2 (two) times daily between meals.   Maree Bracken D, DO  Active Family Member  ferrous sulfate  325 (65 FE) MG EC tablet [id number]  Take 1 tablet (325 mg total) by mouth every other day.  Patient not taking: Reported on 02/03/2024   Davonna Siad, MD  Active Family Member  furosemide (LASIX) 20 MG tablet [id number] Yes Take 20 mg by mouth 2 (two)  times daily. [provider]  Active            Med Note LESLY, Antonella Upson Y   Mon Jan 25, 2024  3:49 PM) Started today  irbesartan -hydrochlorothiazide  (AVALIDE) 300-12.5 MG tablet [id number] Yes Take 1 tablet by mouth daily before breakfast. Maree Bracken D, DO  Active Family Member  metFORMIN  (GLUCOPHAGE ) 500 MG tablet [id number] Yes Take 1 tablet (500 mg total) by mouth at bedtime. Maree Bracken D, DO  Active Family Member  mirtazapine  (REMERON ) 7.5 MG tablet [id number] Yes TAKE ONE TABLET BY MOUTH AT BEDTIME OR DEPRESSION AND WEIGHT LOSS Lamon Pleasant CHRISTELLA DEVONNA  Active   [facility] ULTRA test strip [id number] Yes SMARTSIG:Strip(s) [provider]  Active Family Member  polyethylene glycol powder (GLYCOLAX /MIRALAX ) 17 GM/SCOOP powder [id number] Yes Take by mouth once. [provider]  Active   rosuvastatin  (CRESTOR ) 20 MG tablet [id number] Yes TAKE ONE TABLET BY MOUTH EVERYDAY AT BEDTIME Alvan Dorn FALCON, MD  Active Family Member  triamcinolone ointment (KENALOG) 0.5 % [id number]  Apply 1 application  topically 2 (two) times daily as needed (rash).  Patient not taking: Reported on 01/25/2024   [provider]  Active Family Member           Med Note JERALYN DUNCANS A   Tue Oct 13, 2023 11:21 AM)    Med List Note LESLY, Richerd GRADE, [place] 01/01/24 1623): 01/01/24 spoke with daughter [name] and reviewed via AVS from hospital and updated   Pt's son Loman helps with  meds [phone]            Recommendation:   Referral to: [facility]BC[facility] Longitudinal RN for Complex Care Management  Follow Up Plan:   Referral to RN Case Manager Pharmacist Closing From:  Transitions of Care Program Patient has met all care management goals. Care Management case will be closed. Patient has been provided contact information should new needs arise.   Richerd Fish, RN, BSN, CCM [facility], [facility] RN Care Manager Direct Dial:  [phone]

## 2024-02-03 NOTE — Transitions of Care (Post Inpatient/ED Visit) (Unsigned)
   02/03/2024  Name: Nancy Liu MRN: [id number] DOB: 11-03-1940  {[id number]}

## 2024-02-11 ENCOUNTER — Telehealth: Payer: Self-pay

## 2024-02-11 ENCOUNTER — Other Ambulatory Visit: Payer: Self-pay | Admitting: Neurology

## 2024-02-11 NOTE — Progress Notes (Signed)
 02/11/2024  Patient ID: Nancy Liu, female   DOB: February 19, 1941, 83 y.o.   MRN: [id number]   Chief Complaint  Patient presents with   Medication Management      Nancy Liu is a 83 y.o. year old female who presented for a telephone visit.   They were referred to the pharmacist by their Case Management Team  for assistance in managing complex medication management.    Subjective:  Contacted patient to follow up on medication reconciliation. I was able to speak to the patient's daughter, [name]. She reports the patient is still experiencing drowsiness from some of her medications, but she is eating better and has gained a few pounds (reporting at 134 lbs). She has restarted donepezil  and completed her course of Eliquis  for DVT prophylaxis. The patient has stage 4 ovarian cancer and has decided not to undergo chemotherapy. Her daughter also mentioned worsening eczema that they are going to follow up with a dermatologist regarding.     Care Team: Primary Care Provider: Benjamine Aland, MD ; Next Scheduled Visit: 03/17/24     Medications Reviewed Today     Reviewed by Graylon Keen, [facility] (Pharmacist) on 02/11/24 at 1247  Med List Status: <None>   Medication Order Taking? Sig Documenting Provider Last Dose Status Informant  amLODipine  (NORVASC ) 10 MG tablet [id number] Yes TAKE ONE TABLET BY MOUTH EVERYDAY AT BEDTIME Alvan Dorn FALCON, MD  Active Family Member   Patient not taking:   Discontinued 02/11/24 1247 (Discontinued by provider)   carvedilol  (COREG ) 6.25 MG tablet [id number] Yes TAKE ONE TABLET BY MOUTH BEFORE BREAKFAST and TAKE ONE TABLET BY MOUTH EVERYDAY AT BEDTIME Ricky Fines, MD  Active Family Member  Cholecalciferol (VITAMIN D3) 50 MCG (2000 UT) TABS [id number]  TAKE ONE TABLET BY MOUTH daily in addition TO THE 1,000unit FOR total of 3,000units daily  Patient not taking: Reported on 02/11/2024   Lamon Pleasant CHRISTELLA, PA-C  Active Family Member  cyanocobalamin   (VITAMIN B12) 1000 MCG tablet [id number] Yes Take 1 tablet (1,000 mcg total) by mouth daily. Davonna Siad, MD  Active Family Member  docusate sodium  (COLACE) 100 MG capsule [id number] Yes Take 1 capsule (100 mg total) by mouth 2 (two) times daily.  Patient taking differently: Take 100 mg by mouth daily as needed.   Pappayliou, Dorothyann LABOR, DO  Active Family Member           Med Note ALYNE, SUZEN ORN   Tue Dec 29, 2023 10:51 AM) Not started   donepezil  (ARICEPT ) 5 MG tablet [id number] Yes Take 1 tablet (5 mg total) by mouth at bedtime. [name], DO  Active   feeding supplement (ENSURE ENLIVE / ENSURE PLUS) LIQD [id number] Yes Take 237 mLs by mouth 3 (three) times daily between meals.  Patient taking differently: Take 237 mLs by mouth 2 (two) times daily between meals.   Maree Bracken D, DO  Active Family Member  ferrous sulfate  325 (65 FE) MG EC tablet [id number]  Take 1 tablet (325 mg total) by mouth every other day.  Patient not taking: Reported on 02/11/2024   Davonna Siad, MD  Active Family Member  furosemide (LASIX) 20 MG tablet [id number] Yes Take 20 mg by mouth 2 (two) times daily. [provider]  Active            Med Note LESLY, VICTORIA Y   Mon Jan 25, 2024  3:49 PM) Started today  irbesartan -hydrochlorothiazide  (AVALIDE) 300-12.5 MG tablet [id number] Yes Take 1  tablet by mouth daily before breakfast. Maree Bracken D, DO  Active Family Member  metFORMIN  (GLUCOPHAGE ) 500 MG tablet [id number] Yes Take 1 tablet (500 mg total) by mouth at bedtime. Maree Bracken D, DO  Active Family Member  mirtazapine  (REMERON ) 7.5 MG tablet [id number] Yes TAKE ONE TABLET BY MOUTH AT BEDTIME OR DEPRESSION AND WEIGHT LOSS Lamon Pleasant CHRISTELLA DEVONNA  Active   [facility] ULTRA test strip [id number] Yes SMARTSIG:Strip(s) [provider]  Active Family Member  polyethylene glycol powder (GLYCOLAX /MIRALAX ) 17 GM/SCOOP powder [id number] Yes Take by mouth once. [provider]  Active    rosuvastatin  (CRESTOR ) 20 MG tablet [id number] Yes TAKE ONE TABLET BY MOUTH EVERYDAY AT BEDTIME Alvan Dorn FALCON, MD  Active Family Member  triamcinolone ointment (KENALOG) 0.5 % [id number] Yes Apply 1 application  topically 2 (two) times daily as needed (rash). [provider]  Active Family Member           Med Note JERALYN DUNCANS A   Tue Oct 13, 2023 11:21 AM)    Med List Note Lesly, Richerd GRADE, [place] 01/01/24 1623): 01/01/24 spoke with daughter [name] and reviewed via AVS from hospital and updated   Pt's son Loman helps with meds [phone]           Assessment/Plan:  The patient is benefiting from the appetite stimulation of Mirtazapine  7.5 mg daily, we discussed the benefit outweighing the drowsiness. She is agreeable to continue this medication at this time. We discussed eczema treatment alternatives, they will follow up with the dermatologist to discuss further. Ms. [name] reports no other medication concerns at this time. Patient sees Dr. Benjamine on 03/17/24, I will follow up at that time for a comprehensive medication review.   Follow-up: I will follow-up with the patient in 6 weeks on 03/17/24.  Heather Factor, PharmD Clinical Pharmacist  [phone]

## 2024-02-16 ENCOUNTER — Ambulatory Visit: Admitting: Neurology

## 2024-02-18 ENCOUNTER — Other Ambulatory Visit: Payer: Self-pay

## 2024-02-18 NOTE — Patient Outreach (Signed)
 Complex Care Management   Visit Note  02/18/2024  Name:  Nancy Liu MRN: [id number] DOB: [birth year]  Situation: Referral received for Complex Care Management related to DM, Impaired Mobility. I obtained verbal consent from Caregiver.  Visit completed with daughter, Nancy Liu, and patient  on the phone. Main concern: daughter is inquiring if furosemide should be permanently prescribed.  It appears Oncology prescribed for 10 days due to swellling in legs, dosing finished, continues to have edema in left leg.   Background:   Past Medical History:  Diagnosis Date   Anxiety    Arteriosclerotic cardiovascular disease (ASCVD) 2001   Bare-metal stent placed in the right coronary artery in 12/01; residual 50% lesion of the first diagonal and mid LAD   Arthritis    Breast cancer (HCC)    right 2021   Cerebrovascular disease    Chronic kidney disease    COPD (chronic obstructive pulmonary disease) (HCC)    Coronary artery disease    Stent   Cyst, dermoid, arm, left 03/26/2018   Depression    Diabetes mellitus    excellent control with a low-dose of a single oral agent   Family history of breast cancer 01/23/2021   Family history of ovarian cancer 01/23/2021   GERD (gastroesophageal reflux disease)    with ulcers   History of right coronary artery stent placement 2000   Hyperlipidemia    Hypertension    Sleep apnea    no cpap   Thalassemia minor    Tobacco abuse, in remission    35 pack years; Quit in 1980    Assessment: Patient Reported Symptoms:  Cognitive Cognitive Status: Able to follow simple commands, Normal speech and language skills, Requires Assistance Decision Making, Struggling with memory recall Cognitive/Intellectual Conditions Management [RPT]: Other Other: Neuro-cognitive disorder   Health Maintenance Behaviors: Annual physical exam  Neurological Neurological Review of Symptoms: Vision changes    HEENT HEENT Symptoms Reported: No symptoms reported       Cardiovascular Cardiovascular Symptoms Reported: Swelling in legs or feet Patient's Recent BP reading at home: Swelling in left leg and ankle.  Was prescribed furosemide 20mg  twice daily for 10 days, daughter is inquiring if furosemde should be continued on a regular basis.  She will call Nephology to inquire.    Respiratory Respiratory Symptoms Reported: No symptoms reported    Endocrine Endocrine Symptoms Reported: No symptoms reported (A1C 5.4% on 12/23/23) Is patient diabetic?: Yes Is patient checking blood sugars at home?: Yes List most recent blood sugar readings, include date and time of day: 103mg /dL , taken today by visiting nurse Endocrine Self-Management Outcome: 5 (very good)  Gastrointestinal Additional Gastrointestinal Details: Bowel Plan: by day three she would take Miralax . Lately, she has been going to the bathroom alot.      Genitourinary Genitourinary Symptoms Reported: No symptoms reported    Integumentary Integumentary Symptoms Reported: No symptoms reported Additional Integumentary Details: Dry skin, applies lotion.    Musculoskeletal Musculoskelatal Symptoms Reviewed: Weakness        Psychosocial Psychosocial Symptoms Reported: No symptoms reported     Quality of Family Relationships: helpful, involved, supportive    02/18/2024    PHQ2-9 Depression Screening   Little interest or pleasure in doing things    Feeling down, depressed, or hopeless    PHQ-2 - Total Score    Trouble falling or staying asleep, or sleeping too much    Feeling tired or having little energy    Poor appetite or overeating  Feeling bad about yourself - or that you are a failure or have let yourself or your family down    Trouble concentrating on things, such as reading the newspaper or watching television    Moving or speaking so slowly that other people could have noticed.  Or the opposite - being so fidgety or restless that you have been moving around a lot more than usual     Thoughts that you would be better off dead, or hurting yourself in some way    PHQ2-9 Total Score    If you checked off any problems, how difficult have these problems made it for you to do your work, take care of things at home, or get along with other people    Depression Interventions/Treatment      There were no vitals filed for this visit.  Medications Reviewed Today     Reviewed by Lucian Santana LABOR, RN (Registered Nurse) on 02/18/24 at 1349  Med List Status: <None>   Medication Order Taking? Sig Documenting Provider Last Dose Status Informant  amLODipine  (NORVASC ) 10 MG tablet [id number] Yes TAKE ONE TABLET BY MOUTH EVERYDAY AT BEDTIME Alvan Dorn FALCON, MD  Active Family Member  carvedilol  (COREG ) 6.25 MG tablet [id number] Yes TAKE ONE TABLET BY MOUTH BEFORE BREAKFAST and TAKE ONE TABLET BY MOUTH EVERYDAY AT BEDTIME Ricky Fines, MD  Active Family Member  Cholecalciferol (VITAMIN D3) 50 MCG (2000 UT) TABS [id number]  TAKE ONE TABLET BY MOUTH daily in addition TO THE 1,000unit FOR total of 3,000units daily  Patient not taking: Reported on 02/18/2024   Lamon Pleasant HERO, PA-C  Active Family Member  cyanocobalamin  (VITAMIN B12) 1000 MCG tablet [id number] Yes Take 1 tablet (1,000 mcg total) by mouth daily. Davonna Siad, MD  Active Family Member  docusate sodium  (COLACE) 100 MG capsule [id number] Yes Take 1 capsule (100 mg total) by mouth 2 (two) times daily.  Patient taking differently: Take 100 mg by mouth daily as needed.   Pappayliou, Dorothyann LABOR, DO  Active Family Member           Med Note ALYNE, SUZEN ORN   Tue Dec 29, 2023 10:51 AM) Not started   donepezil  (ARICEPT ) 5 MG tablet [id number] Yes Take 1 tablet (5 mg total) by mouth at bedtime. [name], DO  Active   feeding supplement (ENSURE ENLIVE / ENSURE PLUS) LIQD [id number] Yes Take 237 mLs by mouth 3 (three) times daily between meals.  Patient taking differently: Take 237 mLs by mouth 2 (two) times daily between  meals.   Maree Bracken D, DO  Active Family Member  ferrous sulfate  325 (65 FE) MG EC tablet [id number]  Take 1 tablet (325 mg total) by mouth every other day.  Patient not taking: Reported on 02/18/2024   Davonna Siad, MD  Active Family Member  furosemide (LASIX) 20 MG tablet [id number] Yes Take 20 mg by mouth 2 (two) times daily. [provider]  Active            Med Note LESLY, VICTORIA Y   Mon Jan 25, 2024  3:49 PM) Started today  irbesartan -hydrochlorothiazide  (AVALIDE) 300-12.5 MG tablet [id number] Yes Take 1 tablet by mouth daily before breakfast. Maree Bracken D, DO  Active Family Member  metFORMIN  (GLUCOPHAGE ) 500 MG tablet [id number] Yes Take 1 tablet (500 mg total) by mouth at bedtime. Maree Bracken D, DO  Active Family Member  mirtazapine  (REMERON ) 7.5 MG tablet [id number] Yes TAKE ONE TABLET BY MOUTH AT BEDTIME  OR DEPRESSION AND WEIGHT LOSS Lamon Pleasant CHRISTELLA DEVONNA  Active   [facility] ULTRA test strip [id number] Yes SMARTSIG:Strip(s) [provider]  Active Family Member  polyethylene glycol powder (GLYCOLAX /MIRALAX ) 17 GM/SCOOP powder [id number] Yes Take by mouth once. [provider]  Active   rosuvastatin  (CRESTOR ) 20 MG tablet [id number] Yes TAKE ONE TABLET BY MOUTH EVERYDAY AT BEDTIME Alvan Dorn FALCON, MD  Active Family Member  triamcinolone ointment (KENALOG) 0.5 % [id number] Yes Apply 1 application  topically 2 (two) times daily as needed (rash). [provider]  Active Family Member           Med Note JERALYN DUNCANS A   Tue Oct 13, 2023 11:21 AM)    Med List Note Lesly, Richerd GRADE, [place] 01/01/24 1623): 01/01/24 spoke with daughter Nancy Liu Och and reviewed via AVS from hospital and updated   Pt's son Loman helps with meds [phone]            Recommendation:   Specialty provider follow-up Dermatology scheduled (on patient's calendar),  Oncology 04/15/24; Neurology 04/19/24 Daughter will call Nephrology to inquire if furosemide  should be prescribed for patient.   Follow Up Plan:   Telephone follow-up two weeks.   Santana Stamp BSN, CCM [place]  [facility] Population Health RN Care Manager Direct Dial: [phone]  Fax: [phone]

## 2024-02-18 NOTE — Patient Instructions (Signed)
 Visit Information  Thank you for taking time to visit with me today. Please don't hesitate to contact me if I can be of assistance to you before our next scheduled appointment.  Our next appointment is by telephone on Thursday, October 2nd at 1:00pm. Please call the care guide team at [phone] if you need to cancel or reschedule your appointment.   Following is a copy of your care plan:   Goals Addressed             This Visit's Progress    VBCI RN Care Plan       Problems:  Knowledge Deficits related to Medications  Goal: Over the next 5 days the Patient will demonstrate understanding of rationale for each prescribed medication as evidenced by knowledge of how to take furosemide     verbalize understanding of plan for management of edema in left leg/ankle as evidenced by calling Nephology and Oncology to inquire if patient should be taking furosemide on regular basis.  It appears Oncology prescribed for 10 day due to leg edema  Interventions:   Evaluation of current treatment plan related to left leg edema, Limited education about furosemide prescription* self-management and patient's adherence to plan as established by provider. Discussed plans with patient for ongoing care management follow up and provided patient with direct contact information for care management team Provided education to patient re: managing edema: elevated legs when sitting, perform foot pump exercises, don compression sock to help with swelling.  Reviewed medications with patient and discussed furosemide  Reviewed scheduled/upcoming provider appointments including Oncology 04/15/24   Patient Self-Care Activities:  Attend all scheduled provider appointments Call provider office for new concerns or questions  Take medications as prescribed   Daughter will call Nephrology to inquire if patient should be taking furosemide on regular basis.   Plan:  Telephone follow up appointment with care management  team member scheduled for:  two weeks              A reminder to ALL patients/family/friends, please call the [place]  National Suicide Prevention Lifeline: 848 744 2957 or TTY: (858) 194-7929 TTY (212)872-8604) to talk to a trained counselor if you are experiencing a Mental Health or Behavioral Health Crisis or need someone to talk to.  Patient verbalizes understanding of instructions and care plan provided today and agrees to view in MyChart. Active MyChart status and patient understanding of how to access instructions and care plan via MyChart confirmed with patient.     Santana Stamp BSN, CCM [place]  [facility]BCI Population Health RN Care Manager Direct Dial: [phone]  Fax: [phone]

## 2024-02-22 DIAGNOSIS — I872 Venous insufficiency (chronic) (peripheral): Secondary | ICD-10-CM | POA: Diagnosis not present

## 2024-02-22 DIAGNOSIS — L308 Other specified dermatitis: Secondary | ICD-10-CM | POA: Diagnosis not present

## 2024-03-03 ENCOUNTER — Telehealth: Payer: Self-pay

## 2024-03-03 ENCOUNTER — Other Ambulatory Visit: Payer: Self-pay

## 2024-03-03 NOTE — Patient Outreach (Signed)
 Contacted staff at [facility], they will reach out to PT and OT staff that have been seeing patient to inquire who recommended rollator and extended bath bench and if it has been ordered.  Staff will call back with info.

## 2024-03-03 NOTE — Patient Outreach (Unsigned)
 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   I obtained verbal consent from Caregiver.  Visit completed with [name], daughter  on the phone. Main concern today is obtaining rollator and extended bath bench.   Background:   Past Medical History:  Diagnosis Date   Anxiety    Arteriosclerotic cardiovascular disease (ASCVD) 2001   Bare-metal stent placed in the right coronary artery in 12/01; residual 50% lesion of the first diagonal and mid LAD   Arthritis    Breast cancer (HCC)    right 2021   Cerebrovascular disease    Chronic kidney disease    COPD (chronic obstructive pulmonary disease) (HCC)    Coronary artery disease    Stent   Cyst, dermoid, arm, left 03/26/2018   Depression    Diabetes mellitus    excellent control with a low-dose of a single oral agent   Family history of breast cancer 01/23/2021   Family history of ovarian cancer 01/23/2021   GERD (gastroesophageal reflux disease)    with ulcers   History of right coronary artery stent placement 2000   Hyperlipidemia    Hypertension    Sleep apnea    no cpap   Thalassemia minor    Tobacco abuse, in remission    35 pack years; Quit in 1980    Assessment: Patient Reported Symptoms:  Cognitive Cognitive Status: Able to follow simple commands, Requires Assistance Decision Making, Normal speech and language skills, Struggling with memory recall      Neurological Neurological Review of Symptoms: Vision changes Neurological Comment: Appointment with Dr. Johnye MD 2-3 months ago. Wears glasses  HEENT HEENT Symptoms Reported: Not assessed      Cardiovascular Cardiovascular Symptoms Reported: Swelling in legs or feet     Respiratory Respiratory Symptoms Reported: No symptoms reported    Endocrine Endocrine Symptoms Reported: Not assessed    Gastrointestinal Gastrointestinal Symptoms Reported: Not assessed      Genitourinary Genitourinary Symptoms Reported: Not assessed    Integumentary Integumentary Symptoms Reported: Not assessed    Musculoskeletal Additional Musculoskeletal Details: Needs rollator, this RNCM will call [facility] to inquire if PT/OT placed order or if order was placed by PCP office.   Falls in the past year?: No    Psychosocial Psychosocial Symptoms Reported: No symptoms reported     Do you feel physically threatened by others?: No    03/04/2024    PHQ2-9 Depression Screening   Little interest or pleasure in doing things    Feeling down, depressed, or hopeless    PHQ-2 - Total Score    Trouble falling or staying asleep, or sleeping too much    Feeling tired or having little energy    Poor appetite or overeating     Feeling bad about yourself - or that you are a failure or have let yourself or your family down    Trouble concentrating on things, such as reading the newspaper or watching television    Moving or speaking so slowly that other people could have noticed.  Or the opposite - being so fidgety or restless that you have been moving around a lot more than usual    Thoughts that you would be better off dead, or hurting yourself  in some way    PHQ2-9 Total Score    If you checked off any problems, how difficult have these problems made it for you to do your work, take care of things at home, or get along with other people    Depression Interventions/Treatment      There were no vitals filed for this visit.  Medications Reviewed Today   Medications were not reviewed in this encounter     Recommendation:   Continue Current Plan of Care This RNCM contacted [facility] at [phone] to inquire of order status for rollator and extended bath bench, awaiting  call back .   Follow Up Plan:   Telephone follow-up 2-3 days   Santana Stamp BSN, CCM [place]  [facility] Population Health RN Care Manager Direct Dial: [phone]  Fax: [phone]

## 2024-03-04 NOTE — Patient Instructions (Signed)
 Visit Information  Thank you for taking time to visit with me today. Please don't hesitate to contact me if I can be of assistance to you before our next scheduled appointment.  Your next care management appointment is by telephone in 1-2 business days.   Please call the care guide team at [phone] if you need to cancel, schedule, or reschedule an appointment.   Please call the [place]  National Suicide Prevention Lifeline: 4031795964 or TTY: 878-517-5807 TTY 3256528734) to talk to a trained counselor if you are experiencing a Mental Health or Behavioral Health Crisis or need someone to talk to.  Santana Stamp BSN, CCM [place]  [facility] Population Health RN Care Manager Direct Dial: [phone]  Fax: [phone]

## 2024-03-17 DIAGNOSIS — R2681 Unsteadiness on feet: Secondary | ICD-10-CM | POA: Diagnosis not present

## 2024-03-17 DIAGNOSIS — R2689 Other abnormalities of gait and mobility: Secondary | ICD-10-CM | POA: Diagnosis not present

## 2024-03-17 DIAGNOSIS — F01A0 Vascular dementia, mild, without behavioral disturbance, psychotic disturbance, mood disturbance, and anxiety: Secondary | ICD-10-CM | POA: Diagnosis not present

## 2024-03-17 DIAGNOSIS — Z9181 History of falling: Secondary | ICD-10-CM | POA: Diagnosis not present

## 2024-03-17 DIAGNOSIS — R262 Difficulty in walking, not elsewhere classified: Secondary | ICD-10-CM | POA: Diagnosis not present

## 2024-03-17 DIAGNOSIS — M6281 Muscle weakness (generalized): Secondary | ICD-10-CM | POA: Diagnosis not present

## 2024-03-25 DIAGNOSIS — D539 Nutritional anemia, unspecified: Secondary | ICD-10-CM | POA: Diagnosis not present

## 2024-03-25 DIAGNOSIS — N1831 Chronic kidney disease, stage 3a: Secondary | ICD-10-CM | POA: Diagnosis not present

## 2024-03-25 DIAGNOSIS — D649 Anemia, unspecified: Secondary | ICD-10-CM | POA: Diagnosis not present

## 2024-03-25 DIAGNOSIS — E119 Type 2 diabetes mellitus without complications: Secondary | ICD-10-CM | POA: Diagnosis not present

## 2024-03-25 DIAGNOSIS — D631 Anemia in chronic kidney disease: Secondary | ICD-10-CM | POA: Diagnosis not present

## 2024-03-25 DIAGNOSIS — E559 Vitamin D deficiency, unspecified: Secondary | ICD-10-CM | POA: Diagnosis not present

## 2024-03-25 DIAGNOSIS — E211 Secondary hyperparathyroidism, not elsewhere classified: Secondary | ICD-10-CM | POA: Diagnosis not present

## 2024-03-25 DIAGNOSIS — R809 Proteinuria, unspecified: Secondary | ICD-10-CM | POA: Diagnosis not present

## 2024-03-25 DIAGNOSIS — N189 Chronic kidney disease, unspecified: Secondary | ICD-10-CM | POA: Diagnosis not present

## 2024-03-25 DIAGNOSIS — D7589 Other specified diseases of blood and blood-forming organs: Secondary | ICD-10-CM | POA: Diagnosis not present

## 2024-03-25 DIAGNOSIS — I10 Essential (primary) hypertension: Secondary | ICD-10-CM | POA: Diagnosis not present

## 2024-03-29 ENCOUNTER — Other Ambulatory Visit: Payer: Self-pay

## 2024-03-29 DIAGNOSIS — R627 Adult failure to thrive: Secondary | ICD-10-CM

## 2024-03-29 NOTE — Patient Outreach (Signed)
 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  I obtained verbal consent from Caregiver.  Visit completed with daughter/DPR, [name]  on the phone. Daughter wants to prepare for future needs for patient, family member that is currently helping with morning meds/meal will be starting a new job soon.  She is interested in learning about PCS resources.   Background:   Past Medical History:  Diagnosis Date   Anxiety    Arteriosclerotic cardiovascular disease (ASCVD) 2001   Bare-metal stent placed in the right coronary artery in 12/01; residual 50% lesion of the first diagonal and mid LAD   Arthritis    Breast cancer (HCC)    right 2021   Cerebrovascular disease    Chronic kidney disease    COPD (chronic obstructive pulmonary disease) (HCC)    Coronary artery disease    Stent   Cyst, dermoid, arm, left 03/26/2018   Depression    Diabetes mellitus    excellent control with a low-dose of a single oral agent   Family history of breast cancer 01/23/2021   Family history of ovarian cancer 01/23/2021   GERD (gastroesophageal reflux disease)    with ulcers   History of right coronary artery stent placement 2000   Hyperlipidemia    Hypertension    Sleep apnea    no cpap   Thalassemia minor    Tobacco abuse, in remission    35 pack years; Quit in 1980    Assessment: Patient Reported Symptoms:  Cognitive Cognitive Status: Able to follow simple commands, Struggling with memory recall, Normal speech and language skills, Requires Assistance Decision Making (Assessment performed with daughter/DPR, speaking on behalf of patient.)      Neurological Neurological Review of Symptoms: Not assessed    HEENT  HEENT Symptoms Reported: Not assessed      Cardiovascular Cardiovascular Symptoms Reported: Swelling in legs or feet Patient's Recent BP reading at home: Occasional swelling in bilateral LE, has resolved at this time.    Respiratory Respiratory Symptoms Reported: No symptoms reported    Endocrine Endocrine Symptoms Reported: No symptoms reported Is patient diabetic?: Yes Is patient checking blood sugars at home?: Yes Endocrine Self-Management Outcome: 5 (very good) Endocrine Comment: A1C 5.4 on 12/23/23  Gastrointestinal Gastrointestinal Symptoms Reported: Diarrhea Additional Gastrointestinal Details: She is having difficulty keeping down some foods - MD aware, she drinks a lot of water , tea. MD ordered Remeron , this is helping increase her appetite.      Genitourinary Genitourinary Symptoms Reported: Not assessed    Integumentary Additional Integumentary Details: Legs were breaking out, Kidney doctor prescribed some cream, legs are better.    Musculoskeletal Musculoskelatal Symptoms Reviewed: Unsteady gait Additional Musculoskeletal Details: Received rollator   Falls in the past year?: No    Psychosocial Psychosocial Symptoms Reported: Not assessed          03/29/2024    PHQ2-9 Depression Screening   Little interest or pleasure in doing things    Feeling down, depressed, or hopeless    PHQ-2 - Total Score    Trouble falling or staying asleep, or sleeping too much    Feeling tired or having little energy    Poor appetite or overeating     Feeling bad about  yourself - or that you are a failure or have let yourself or your family down    Trouble concentrating on things, such as reading the newspaper or watching television    Moving or speaking so slowly that other people could have noticed.  Or the opposite - being so fidgety or restless that you have been moving around a lot more than usual    Thoughts that you would be better off dead, or hurting yourself in some way     PHQ2-9 Total Score    If you checked off any problems, how difficult have these problems made it for you to do your work, take care of things at home, or get along with other people    Depression Interventions/Treatment      There were no vitals filed for this visit.  Medications Reviewed Today     Reviewed by Lucian Santana LABOR, RN (Registered Nurse) on 03/29/24 at 1606  Med List Status: <None>   Medication Order Taking? Sig Documenting Provider Last Dose Status Informant  amLODipine  (NORVASC ) 10 MG tablet [id number]  TAKE ONE TABLET BY MOUTH EVERYDAY AT BEDTIME Alvan Dorn FALCON, MD  Active Family Member  carvedilol  (COREG ) 6.25 MG tablet [id number]  TAKE ONE TABLET BY MOUTH BEFORE BREAKFAST and TAKE ONE TABLET BY MOUTH EVERYDAY AT BEDTIME Ricky Fines, MD  Active Family Member  Cholecalciferol (VITAMIN D3) 50 MCG (2000 UT) TABS [id number]  TAKE ONE TABLET BY MOUTH daily in addition TO THE 1,000unit FOR total of 3,000units daily  Patient not taking: Reported on 02/18/2024   Lamon Pleasant HERO, PA-C  Active Family Member  cyanocobalamin  (VITAMIN B12) 1000 MCG tablet [id number]  Take 1 tablet (1,000 mcg total) by mouth daily. Davonna Siad, MD  Active Family Member  docusate sodium  (COLACE) 100 MG capsule [id number]  Take 1 capsule (100 mg total) by mouth 2 (two) times daily.  Patient taking differently: Take 100 mg by mouth daily as needed.   Pappayliou, Dorothyann LABOR, DO  Active Family Member           Med Note ALYNE, SUZEN ORN   Tue Dec 29, 2023 10:51 AM) Not started   donepezil  (ARICEPT ) 5 MG tablet [id number]  Take 1 tablet (5 mg total) by mouth at bedtime. [name], DO  Active   feeding supplement (ENSURE ENLIVE / ENSURE PLUS) LIQD [id number]  Take 237 mLs by mouth 3 (three) times daily between meals.  Patient taking differently: Take 237 mLs by mouth 2 (two) times daily between meals.   Maree Bracken D, DO  Active Family Member  ferrous sulfate  325 (65 FE) MG EC tablet [id number]   Take 1 tablet (325 mg total) by mouth every other day.  Patient not taking: Reported on 02/18/2024   Davonna Siad, MD  Active Family Member  furosemide (LASIX) 20 MG tablet [id number]  Take 20 mg by mouth 2 (two) times daily. [provider]  Active            Med Note LESLY, VICTORIA Y   Mon Jan 25, 2024  3:49 PM) Started today  irbesartan -hydrochlorothiazide  (AVALIDE) 300-12.5 MG tablet [id number]  Take 1 tablet by mouth daily before breakfast. Maree Bracken D, DO  Active Family Member  metFORMIN  (GLUCOPHAGE ) 500 MG tablet [id number]  Take 1 tablet (500 mg total) by mouth at bedtime. Maree Bracken D, DO  Active Family Member  mirtazapine  (REMERON ) 7.5 MG tablet [id number]  TAKE ONE TABLET BY MOUTH AT BEDTIME OR DEPRESSION AND  WEIGHT LOSS Lamon Pleasant CHRISTELLA DEVONNA  Active   [facility] ULTRA test strip [id number]  SMARTSIG:Strip(s) [provider]  Active Family Member  polyethylene glycol powder (GLYCOLAX /MIRALAX ) 17 GM/SCOOP powder [id number]  Take by mouth once. [provider]  Active   rosuvastatin  (CRESTOR ) 20 MG tablet [id number]  TAKE ONE TABLET BY MOUTH EVERYDAY AT BEDTIME Alvan Dorn FALCON, MD  Active Family Member  triamcinolone ointment (KENALOG) 0.5 % [id number]  Apply 1 application  topically 2 (two) times daily as needed (rash). [provider]  Active Family Member           Med Note JERALYN DUNCANS A   Tue Oct 13, 2023 11:21 AM)    Med List Note Lesly, Richerd GRADE, [place] 01/01/24 1623): 01/01/24 spoke with daughter [name] and reviewed via AVS from hospital and updated   Pt's son Loman helps with meds [phone]            Recommendation:   Discussed upcoming appts:  Specialty provider follow-up : Nephology 04/07/24; Oncology 04/15/24; Gyn/Onc 04/18/24; Neurology 04/19/24 Referral to: SW for resources for personal care services  Follow Up Plan:   Telephone follow-up in 1 month  Santana Stamp BSN, CCM   [facility]  Population Health RN Care Manager Direct Dial: [phone]  Fax: [phone]

## 2024-03-29 NOTE — Patient Instructions (Signed)
 Visit Information  Thank you for taking time to visit with me today. Please don't hesitate to contact me if I can be of assistance to you before our next scheduled appointment.  Your next care management appointment is by telephone on Tuesday, November 25th at 3pm.     Please call the care guide team at [phone] if you need to cancel, schedule, or reschedule an appointment.   A reminder to ALL patients/family/friends, please call the [place]  National Suicide Prevention Lifeline: (315)171-2146 or TTY: (867)692-0909 TTY 724-292-1362) to talk to a trained counselor if you are experiencing a Mental Health or Behavioral Health Crisis or need someone to talk to.  Santana Stamp BSN, CCM [place]  [facility] Population Health RN Care Manager Direct Dial: [phone]  Fax: [phone]

## 2024-03-31 DIAGNOSIS — G4733 Obstructive sleep apnea (adult) (pediatric): Secondary | ICD-10-CM | POA: Diagnosis not present

## 2024-03-31 DIAGNOSIS — I129 Hypertensive chronic kidney disease with stage 1 through stage 4 chronic kidney disease, or unspecified chronic kidney disease: Secondary | ICD-10-CM | POA: Diagnosis not present

## 2024-03-31 DIAGNOSIS — I5032 Chronic diastolic (congestive) heart failure: Secondary | ICD-10-CM | POA: Diagnosis not present

## 2024-03-31 DIAGNOSIS — N1831 Chronic kidney disease, stage 3a: Secondary | ICD-10-CM | POA: Diagnosis not present

## 2024-04-06 NOTE — Addendum Note (Signed)
 Addended by: LUCIAN PAY A on: 04/06/2024 07:46 AM   Modules accepted: Orders

## 2024-04-07 ENCOUNTER — Encounter (HOSPITAL_COMMUNITY): Payer: Self-pay

## 2024-04-07 ENCOUNTER — Inpatient Hospital Stay: Attending: Oncology

## 2024-04-07 ENCOUNTER — Encounter (HOSPITAL_COMMUNITY)
Admission: RE | Admit: 2024-04-07 | Discharge: 2024-04-07 | Disposition: A | Discharge status: Home / Self Care | Source: Ambulatory Visit | Attending: Oncology | Admitting: Oncology

## 2024-04-07 DIAGNOSIS — Z8544 Personal history of malignant neoplasm of other female genital organs: Secondary | ICD-10-CM | POA: Diagnosis not present

## 2024-04-07 DIAGNOSIS — C569 Malignant neoplasm of unspecified ovary: Secondary | ICD-10-CM

## 2024-04-07 DIAGNOSIS — D649 Anemia, unspecified: Secondary | ICD-10-CM | POA: Diagnosis not present

## 2024-04-07 DIAGNOSIS — Z90722 Acquired absence of ovaries, bilateral: Secondary | ICD-10-CM | POA: Diagnosis not present

## 2024-04-07 DIAGNOSIS — Z9011 Acquired absence of right breast and nipple: Secondary | ICD-10-CM | POA: Diagnosis not present

## 2024-04-07 DIAGNOSIS — Z86000 Personal history of in-situ neoplasm of breast: Secondary | ICD-10-CM | POA: Insufficient documentation

## 2024-04-07 DIAGNOSIS — C774 Secondary and unspecified malignant neoplasm of inguinal and lower limb lymph nodes: Secondary | ICD-10-CM | POA: Insufficient documentation

## 2024-04-07 DIAGNOSIS — Z9079 Acquired absence of other genital organ(s): Secondary | ICD-10-CM | POA: Insufficient documentation

## 2024-04-07 LAB — COMPREHENSIVE METABOLIC PANEL WITH GFR
ALT: 16 U/L (ref 0–44)
AST: 24 U/L (ref 15–41)
Albumin: 4.4 g/dL (ref 3.5–5.0)
Alkaline Phosphatase: 62 U/L (ref 38–126)
Anion gap: 9 (ref 5–15)
BUN: 18 mg/dL (ref 8–23)
CO2: 30 mmol/L (ref 22–32)
Calcium: 9.5 mg/dL (ref 8.9–10.3)
Chloride: 102 mmol/L (ref 98–111)
Creatinine, Ser: 1.07 mg/dL — ABNORMAL HIGH (ref 0.44–1.00)
GFR, Estimated: 52 mL/min — ABNORMAL LOW (ref >60)
Glucose, Bld: 102 mg/dL — ABNORMAL HIGH (ref 70–99)
Potassium: 4.2 mmol/L (ref 3.5–5.1)
Sodium: 142 mmol/L (ref 135–145)
Total Bilirubin: 0.3 mg/dL (ref 0.0–1.2)
Total Protein: 7 g/dL (ref 6.5–8.1)

## 2024-04-07 LAB — IRON AND TIBC
Iron: 60 ug/dL (ref 28–170)
Saturation Ratios: 19 % (ref 10.4–31.8)
TIBC: 314 ug/dL (ref 250–450)
UIBC: 253 ug/dL

## 2024-04-07 LAB — CBC WITH DIFFERENTIAL/PLATELET
Abs Immature Granulocytes: 0.01 K/uL (ref 0.00–0.07)
Basophils Absolute: 0 K/uL (ref 0.0–0.1)
Basophils Relative: 0 %
Eosinophils Absolute: 0.3 K/uL (ref 0.0–0.5)
Eosinophils Relative: 5 %
HCT: 37.6 % (ref 36.0–46.0)
Hemoglobin: 11.5 g/dL — ABNORMAL LOW (ref 12.0–15.0)
Immature Granulocytes: 0 %
Lymphocytes Relative: 16 %
Lymphs Abs: 1 K/uL (ref 0.7–4.0)
MCH: 24.6 pg — ABNORMAL LOW (ref 26.0–34.0)
MCHC: 30.6 g/dL (ref 30.0–36.0)
MCV: 80.3 fL (ref 80.0–100.0)
Monocytes Absolute: 0.5 K/uL (ref 0.1–1.0)
Monocytes Relative: 8 %
Neutro Abs: 4.6 K/uL (ref 1.7–7.7)
Neutrophils Relative %: 71 %
Platelets: 257 K/uL (ref 150–400)
RBC: 4.68 MIL/uL (ref 3.87–5.11)
RDW: 17.3 % — ABNORMAL HIGH (ref 11.5–15.5)
WBC: 6.4 K/uL (ref 4.0–10.5)
nRBC: 0 % (ref 0.0–0.2)

## 2024-04-07 LAB — FERRITIN: Ferritin: 99 ng/mL (ref 11–307)

## 2024-04-11 ENCOUNTER — Other Ambulatory Visit: Payer: Self-pay | Admitting: Physician Assistant

## 2024-04-11 DIAGNOSIS — F32A Depression, unspecified: Secondary | ICD-10-CM

## 2024-04-11 DIAGNOSIS — R634 Abnormal weight loss: Secondary | ICD-10-CM

## 2024-04-13 ENCOUNTER — Other Ambulatory Visit: Payer: Self-pay

## 2024-04-13 NOTE — Patient Outreach (Signed)
 Social Drivers of Health  Community Resource and Care Coordination Visit Note   04/13/2024  Name: Nancy Liu MRN: [id number] DOB:1940-09-21  Situation: Referral received for [facility] needs assessment and assistance related to Personal Care Services. I obtained verbal consent from Caregiver.  Visit completed with Caregiver on the phone.   Background:      Assessment:   Goals Addressed             This Visit's Progress    BSW Goal       Current SDOH Barriers:  Personal Care Services  Interventions: Patient interviewed and appropriate screenings performed Referred patient to community resources           Recommendation:   attend all scheduled provider appointments Reach out to resources  Follow Up Plan:   Patient has achieved all patient stated goals. [employer] will be closed. Patient has been provided contact information should new needs arise.   Orlean Fey, BSW [place]  [facility] Social Worker, [facility] [phone]

## 2024-04-13 NOTE — Patient Instructions (Signed)
 Visit Information  Thank you for taking time to visit with me today. Please don't hesitate to contact me if I can be of assistance to you before our next scheduled appointment.  Our next appointment is no further scheduled appointments.   Please call the care guide team at [phone] if you need to cancel or reschedule your appointment.   Following is a copy of your care plan:   Goals Addressed             This Visit's Progress    BSW Goal       Current SDOH Barriers:  Personal Care Services  Interventions: Patient interviewed and appropriate screenings performed Referred patient to community resources           Please call the Suicide and Crisis Lifeline: 988 call the [place]  National Suicide Prevention Lifeline: 3521284661 or TTY: 765-743-9922 TTY 239-470-6804) to talk to a trained counselor call 1-800-273-TALK (toll free, 24 hour hotline) call the [facility]: [phone] call 911 if you are experiencing a Mental Health or Behavioral Health Crisis or need someone to talk to.  Caretaker verbalized understanding of Care plan and visit instructions communicated this visit  Orlean Fey, BSW [place]  Value [facility] Institute Social Worker, [facility] [phone]

## 2024-04-14 ENCOUNTER — Encounter (HOSPITAL_COMMUNITY)
Admission: RE | Admit: 2024-04-14 | Discharge: 2024-04-14 | Disposition: A | Discharge status: Home / Self Care | Source: Ambulatory Visit | Attending: Oncology | Admitting: Oncology

## 2024-04-14 DIAGNOSIS — I10 Essential (primary) hypertension: Secondary | ICD-10-CM | POA: Diagnosis not present

## 2024-04-14 DIAGNOSIS — R590 Localized enlarged lymph nodes: Secondary | ICD-10-CM | POA: Diagnosis not present

## 2024-04-14 DIAGNOSIS — F323 Major depressive disorder, single episode, severe with psychotic features: Secondary | ICD-10-CM | POA: Diagnosis not present

## 2024-04-14 DIAGNOSIS — I129 Hypertensive chronic kidney disease with stage 1 through stage 4 chronic kidney disease, or unspecified chronic kidney disease: Secondary | ICD-10-CM | POA: Diagnosis not present

## 2024-04-14 DIAGNOSIS — E0800 Diabetes mellitus due to underlying condition with hyperosmolarity without nonketotic hyperglycemic-hyperosmolar coma (NKHHC): Secondary | ICD-10-CM | POA: Diagnosis not present

## 2024-04-14 DIAGNOSIS — C569 Malignant neoplasm of unspecified ovary: Secondary | ICD-10-CM | POA: Insufficient documentation

## 2024-04-14 DIAGNOSIS — E559 Vitamin D deficiency, unspecified: Secondary | ICD-10-CM | POA: Diagnosis not present

## 2024-04-14 DIAGNOSIS — C5700 Malignant neoplasm of unspecified fallopian tube: Secondary | ICD-10-CM | POA: Diagnosis not present

## 2024-04-14 DIAGNOSIS — F0150 Vascular dementia without behavioral disturbance: Secondary | ICD-10-CM | POA: Diagnosis not present

## 2024-04-14 DIAGNOSIS — F518 Other sleep disorders not due to a substance or known physiological condition: Secondary | ICD-10-CM | POA: Diagnosis not present

## 2024-04-14 MED ORDER — FLUDEOXYGLUCOSE F - 18 (FDG) INJECTION
6.0130 | Freq: Once | INTRAVENOUS | Status: AC | PRN
  Administered 2024-04-14: 6.013 via INTRAVENOUS

## 2024-04-15 ENCOUNTER — Inpatient Hospital Stay: Admitting: Oncology

## 2024-04-15 NOTE — Progress Notes (Deleted)
 NEUROLOGY FOLLOW UP OFFICE NOTE  Nancy Liu [id number]  Assessment/Plan:   Major neurocognitive disorder of unclear etiology.  She had a positive DaTscan  but clinical presentation not consistent with Parkinson's disease. Tremor.  Despite positive DaTscan , presentation not consistent with a parkinson's tremor and believe more likely to be an essential tremor.    Donepezil  10mg  at bedtime ***  Total time spent in chart and face to face with patient and family:  ***  Subjective:  Nancy Liu is an 83 year old right-handed female with CAD, HTN, HLD, DM II, cerebrovascular disease, Thalassemia minor who follows up for dementia and tremor.  Accompanied by her daughter and son who supplements history.    UPDATE: Current medications:  Donepezil  10mg  at bedtime  DaTscan  on 09/11/2023 demonstrated decreased striatal loflupane activity in the putamen and heads of the caudate nuclei with greater loss on the right compared to the left, which may be seen in Parkinsonian syndromes.  However, her clinical presentation did not seem parkinsonian and her tremor was not characteristic for a Parkinson's tremor.  If tremor was disruptive, recommended first trying primidone  first ***    HISTORY: Symptoms started when Covid pandemic happened.  Increased depression.  Nightmares.  Decreased appetite and lost weight.  She reports problems with balance, meaning she stumbles and feels lightheaded.  Being out in the heat and sun aggravates it .  When she is eating sometimes, her hand is shaking if she is holding utensils.  She may notice it in the left hand too. Needs to hold it with her other hand to stop it.  When sitting, her legs may start shaking.  She also reports cramps in feet.  MRI of brain 10/03/2022 showed mild chronic small vessel ischemic changes.  No family history of tremor.    In January 2025, she had become increasingly confused and forgetful.  She would frequently misplace objects,  confuse names of items but would also not know where she was, even at home.  She was also not taking her medications correctly.  On 06/30/2023, she appeared to have some facial asymmetry, so she was admitted to [facility].  Because family endorsed facial asymmetry, she was a stroke code.  NIH stroke scale was 0.  CT head was negative.  CTA head and neck showed mild stenosis in the proximal right M2 and P1-P2 junction, and right vertebral artery not visualized from its origin through distal V2 segment where it likely reconstitutes via collaterals and diminutive right V3 segment but no emergent LVO.  MRI of brain again showed mild chronic small vessel ischemic changes in the cerebral white matter, but no acute findings.  EEG revealed moderate diffuse slowing but no seizures or epileptiform discharges.  Labs revealed slight worsening of kidney function (Cr 1.21 and GFR 45).  Otherwise, labs largely normal/unremarkable, including CBC, CMP, TSH, B12, ammonia and UDS.  Due to worsening tremor, she was prescribed primidone  but she has not picked it up yet.  For the past 3 months, she has been losing more weight due to poor appetite.  She may be agitated when she has difficulty explaining things but she isn't combative.  She does experience hallucinations. Sometimes she sees shadows or she may hear children outside the window who aren't there.  She has been able to perform ADLs such as bathing, dressing and using the toilet independently.  She underwent neuropsychological evaluation on 2/11, which demonstrated cognitive impairment of multiple domains including memory, language, visuospatial functioning,  processing speed, and aspects of executive functioning meeting diagnosis of mild dementia but without clear etiology.  Findings may be complicated by residual metabolic encephalopathy.    PAST MEDICAL HISTORY: Past Medical History:  Diagnosis Date   Anxiety    Arteriosclerotic cardiovascular disease (ASCVD)  2001   Bare-metal stent placed in the right coronary artery in 12/01; residual 50% lesion of the first diagonal and mid LAD   Arthritis    Breast cancer (HCC)    right 2021   Cerebrovascular disease    Chronic kidney disease    COPD (chronic obstructive pulmonary disease) (HCC)    Coronary artery disease    Stent   Cyst, dermoid, arm, left 03/26/2018   Depression    Diabetes mellitus    excellent control with a low-dose of a single oral agent   Family history of breast cancer 01/23/2021   Family history of ovarian cancer 01/23/2021   GERD (gastroesophageal reflux disease)    with ulcers   History of right coronary artery stent placement 2000   Hyperlipidemia    Hypertension    Sleep apnea    no cpap   Thalassemia minor    Tobacco abuse, in remission    35 pack years; Quit in 1980    MEDICATIONS: Current Outpatient Medications on File Prior to Visit  Medication Sig Dispense Refill   amLODipine  (NORVASC ) 10 MG tablet TAKE ONE TABLET BY MOUTH EVERYDAY AT BEDTIME 90 tablet 3   carvedilol  (COREG ) 6.25 MG tablet TAKE ONE TABLET BY MOUTH BEFORE BREAKFAST and TAKE ONE TABLET BY MOUTH EVERYDAY AT BEDTIME 60 tablet 2   Cholecalciferol (VITAMIN D3) 50 MCG (2000 UT) TABS TAKE ONE TABLET BY MOUTH daily in addition TO THE 1,000unit FOR total of 3,000units daily (Patient not taking: Reported on 04/13/2024) 30 tablet 1   clobetasol cream (TEMOVATE) 0.05 % Apply topically 2 (two) times daily as needed.     cyanocobalamin  (VITAMIN B12) 1000 MCG tablet Take 1 tablet (1,000 mcg total) by mouth daily. 90 tablet 2   docusate sodium  (COLACE) 100 MG capsule Take 1 capsule (100 mg total) by mouth 2 (two) times daily. 60 capsule 2   donepezil  (ARICEPT ) 5 MG tablet Take 1 tablet (5 mg total) by mouth at bedtime. 30 tablet 2   feeding supplement (ENSURE ENLIVE / ENSURE PLUS) LIQD Take 237 mLs by mouth 3 (three) times daily between meals. 237 mL 12   ferrous sulfate  325 (65 FE) MG EC tablet Take 1 tablet  (325 mg total) by mouth every other day. (Patient not taking: Reported on 04/13/2024) 45 tablet 3   furosemide (LASIX) 20 MG tablet Take 20 mg by mouth 2 (two) times daily.     irbesartan -hydrochlorothiazide  (AVALIDE) 300-12.5 MG tablet Take 1 tablet by mouth daily before breakfast. 90 tablet 3   metFORMIN  (GLUCOPHAGE ) 500 MG tablet Take 1 tablet (500 mg total) by mouth at bedtime. 30 tablet 0   mirtazapine  (REMERON ) 7.5 MG tablet TAKE ONE TABLET BY MOUTH AT BEDTIME FOR DEPRESSION AND WEIGHT LOSS 30 tablet 2   ONETOUCH ULTRA test strip SMARTSIG:Strip(s)     polyethylene glycol powder (GLYCOLAX /MIRALAX ) 17 GM/SCOOP powder Take by mouth once.     rosuvastatin  (CRESTOR ) 20 MG tablet TAKE ONE TABLET BY MOUTH EVERYDAY AT BEDTIME 90 tablet 3   triamcinolone ointment (KENALOG) 0.5 % Apply 1 application  topically 2 (two) times daily as needed (rash).     No current facility-administered medications on file prior to visit.  ALLERGIES: Allergies  Allergen Reactions   Protonix  [Pantoprazole  Sodium] Other (See Comments)    DRY LIPS    FAMILY HISTORY: Family History  Problem Relation Age of Onset   Heart disease Mother        also hypertension and asthma   Lung cancer Mother        dx 92s   Cervical cancer Mother        dx before 39   Alzheimer's disease Maternal Grandmother    Diabetes Half-Sister    Breast cancer Half-Sister 10   Multiple myeloma Nephew 53       war-related exposures   Colon cancer Neg Hx    Colon polyps Neg Hx    Endometrial cancer Neg Hx    Pancreatic cancer Neg Hx    Prostate cancer Neg Hx    Ovarian cancer Neg Hx       Objective:  *** General: No acute distress.  Patient appears ***-groomed.   Head:  Normocephalic/atraumatic Eyes:  Fundi examined but not visualized Neck: supple, no paraspinal tenderness, full range of motion Heart:  Regular rate and rhythm Neurological Exam: alert and oriented.  Speech fluent and not dysarthric, language intact.  CN  II-XII intact. Bulk and tone normal, muscle strength 5/5 throughout.  Sensation to light touch intact.  Deep tendon reflexes 2+ throughout, toes downgoing.  Finger to nose testing intact.  Gait normal, Romberg negative.   Juliene Dunnings, DO  CC: ***

## 2024-04-18 ENCOUNTER — Encounter: Payer: Self-pay | Admitting: Psychiatry

## 2024-04-18 ENCOUNTER — Inpatient Hospital Stay (HOSPITAL_BASED_OUTPATIENT_CLINIC_OR_DEPARTMENT_OTHER): Admitting: Psychiatry

## 2024-04-18 VITALS — BP 136/67 | HR 66 | Temp 97.6°F | Resp 18 | Wt 134.0 lb

## 2024-04-18 DIAGNOSIS — C5700 Malignant neoplasm of unspecified fallopian tube: Secondary | ICD-10-CM

## 2024-04-18 DIAGNOSIS — C774 Secondary and unspecified malignant neoplasm of inguinal and lower limb lymph nodes: Secondary | ICD-10-CM | POA: Diagnosis not present

## 2024-04-18 DIAGNOSIS — R159 Full incontinence of feces: Secondary | ICD-10-CM

## 2024-04-18 DIAGNOSIS — Z8544 Personal history of malignant neoplasm of other female genital organs: Secondary | ICD-10-CM | POA: Diagnosis not present

## 2024-04-18 NOTE — Progress Notes (Signed)
 Gynecologic Oncology Return Clinic Visit  Date of Service: 04/18/2024 Referring Provider: [name], MD   Assessment & Plan: Nancy Liu is a 83 y.o. 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 Presents today for surveillance.  Fallopian tube carcinoma: - Seen by Dr. Dry on 01/22/24, declined adjuvant chemo - Prior germline testing negative. - NGS deferred given declined adjuvant treatment.  - NED on exam today.  - CA125 is not a marker for her.  - Signs symptoms of recurrence reviewed. - Continue surveillance q49mo.  - Given that patient would not desire intervention except for if development of symptoms, palliation of symptoms, we discussed the pros and cons of imaging monitoring.  Feel that it is reasonable to defer imaging to symptoms or changing exam and patient wishes to pursue this route.    RTC 79mo.  Hoy Masters, MD Gynecologic Oncology   Medical Decision Making I personally spent  TOTAL 15 minutes face-to-face and non-face-to-face in the care of this patient, which includes all pre, intra, and post visit time on the date of service.    ----------------------- Reason for Visit: Follow-up  Treatment History: Oncology History  Ductal carcinoma in situ (DCIS) of right breast  01/01/2021 Mammogram   FINDINGS: Multiple circumscribed oval masses are noted in the MEDIAL portion of the RIGHT breast. Masses vary from 1-4 millimeters in diameter and appear contiguous, spanning 8.9 x 3.6 x 5.1 centimeters. There are faint calcifications spanning 4.2 x 1.9 x 3.5 centimeters in this same, segmental distribution.   01/14/2021 Cancer Staging   Staging form: Breast, AJCC 8th Edition - Clinical stage from 01/14/2021: Stage 0 (cTis (DCIS), cN0, cM0, G2, ER+, PR+, HER2: Not Assessed) - Signed by [name], MD on 01/21/2021 Stage prefix: Initial  diagnosis Histologic grading system: 3 grade system   01/14/2021 Pathology Results   Diagnosis 1. Breast, right, needle core biopsy, anterior extent - DUCTAL CARCINOMA IN SITU, INTERMEDIATE GRADE WITH CALCIFICATIONS. SEE NOTE 2. Breast, right, needle core biopsy, posterior extent - DUCTAL CARCINOMA IN SITU, INTERMEDIATE GRADE WITH CALCIFICATIONS. SEE NOTE Diagnosis Note 1. and 2. DCIS measures 0.6 cm in part 1 and 0.4 cm in part 2.  1. PROGNOSTIC INDICATORS Results: IMMUNOHISTOCHEMICAL AND MORPHOMETRIC ANALYSIS PERFORMED MANUALLY Estrogen Receptor: >95%, POSITIVE, STRONG STAINING INTENSITY Progesterone Receptor: >95%, POSITIVE, STRONG STAINING INTENSITY     01/17/2021 Initial Diagnosis   Ductal carcinoma in situ (DCIS) of right breast   02/05/2021 Genetic Testing   Negative hereditary cancer genetic testing: no pathogenic variants detected in [facility] CustomNext-Cancer +RNAinsight Panel.  The report date is February 05, 2021.   The CustomNext-Cancer+RNAinsight panel offered by [employer] includes sequencing and rearrangement analysis for the following 47 genes:  APC, ATM, AXIN2, BARD1, BMPR1A, BRCA1, BRCA2, BRIP1, CDH1, CDK4, CDKN2A, CHEK2, DICER1, EPCAM, GREM1, HOXB13, MEN1, MLH1, MSH2, MSH3, MSH6, MUTYH, NBN, NF1, NF2, NTHL1, PALB2, PMS2, POLD1, POLE, PTEN, RAD51C, RAD51D, RECQL, RET, SDHA, SDHAF2, SDHB, SDHC, SDHD, SMAD4, SMARCA4, STK11, TP53, TSC1, TSC2, and VHL.  RNA data is routinely analyzed for use in variant interpretation for all genes.   02/20/2021 Cancer Staging   Staging form: Breast, AJCC 8th Edition - Pathologic stage from 02/20/2021: No Stage Recommended (pTis (DCIS), pNX, cM1, G2, ER+, PR+, HER2: Not Assessed) - Signed by [name], MD on 03/12/2021 Stage prefix: Initial diagnosis Histologic grading system: 3 grade system Residual tumor (R): R0 - None   02/20/2021 Definitive  Surgery   FINAL MICROSCOPIC DIAGNOSIS:   A. BREAST, RIGHT, MASTECTOMY:  - Multifocal  intermediate grade ductal carcinoma in situ with calcifications.  - Atypical lobular hyperplasia.  - Margins are negative for carcinoma.  - Biopsy site (x2).  - See oncology table.    High grade serous carcinoma (HCC)  10/01/2023 PET scan   IMPRESSION: 1. Hypermetabolic left inguinal lymph node, maximum SUV 9.2. Given the high SUV, neoplastic involvement is a distinct possibility tissue sampling should be considered. 2. Multifocal accentuated activity in the colon is likely physiologic given the lack of correlate of CT findings, although technically nonspecific. 3. Right mastectomy.   10/21/2023 Pathology Results    FINAL MICROSCOPIC DIAGNOSIS:   A. LYMPH NODE, INGUINAL, BIOPSY:  - Metastatic poorly differentiated adenocarcinoma, see comment   Immunohistochemical stains show that the tumor cells are positive for  CK7 and PAX8 with patchy, weak labeling for CK5/6 and p16.  Immunostains for CK20, CDX2, GATA3 and p63 are negative.  This immunoprofile is most  consistent with a gynecologic primary while differential diagnosis can  include a primary renal cell carcinoma but is less likely.   Immunohistochemical stain for p53 shows a clonal loss of expression pattern, most consistent with metastatic high-grade serous carcinoma.    11/10/2023 Cancer Staging   Staging form: Ovary, AJCC 7th Edition - Clinical stage from 11/10/2023: FIGO Stage IVB, calculated as Stage IV (T1a, N1, M1) - Signed by Eldonna Mays, MD on 01/18/2024 Stage prefix: Initial diagnosis Source of metastatic specimen: Inguinal Lymph Node Histologic grade (G): G4 Lymph-vascular invasion (LVI): Presence of LVI unknown/indeterminate   11/13/2023 Initial Diagnosis   High grade serous carcinoma (HCC)   11/19/2023 Pathology Results   Cancer type ID: Ovary, 90% probability Serous adenocarcinoma     12/29/2023 Procedure   Robotic assisted BSO, omentectomy, left inguinofemoral lymph node removal   12/29/2023 Pathology Results    FINAL MICROSCOPIC DIAGNOSIS:   A. LEFT INGUINAL LYMPH NODE, EXCISION:  - 1 lymph node negative for carcinoma  - Separate soft tissue fragment with carcinoma.  B. LEFT OVARY AND FALLOPIAN TUBE, RESECTION:  - Serous tubular intraepithelial carcinoma (STIC), fallopian tube  - Unremarkable ovary   C. RIGHT OVARY AND FALLOPIAN TUBE, RESECTION:  - High-grade serous carcinoma of fallopian tube  - Serous tubal intraepithelial carcinoma, fallopian tube  - Unremarkable ovary  - See oncology table   COMMENT: The patient's history of carcinoma of GYN origin metastatic to inguinal lymph node is noted.  Focal areas of the right fallopian tube show high-grade serous carcinoma and serous tubal intraepithelial carcinoma (STIC).  A p53 immunohistochemical stain appears to show clonal loss of expression.  Serous tubal intraepithelial carcinoma is also noted in the left fallopian tube.  The findings are most consistent with high-grade serous carcinoma of fallopian tube origin.       Interval History: Patient reports overall doing well.  No major changes.  Continues to have occasional fecal incontinence which is overall unchanged. No new vaginal bleeding, abdominal/pelvic pain, unintentional weight loss, change in bowel or bladder habits, early satiety, bloating, nausea/vomiting.    Past Medical/Surgical History: Past Medical History:  Diagnosis Date   Anxiety    Arteriosclerotic cardiovascular disease (ASCVD) 2001   Bare-metal stent placed in the right coronary artery in 12/01; residual 50% lesion of the first diagonal and mid LAD   Arthritis    Breast cancer (HCC)    right 2021   Cerebrovascular disease    Chronic kidney disease  COPD (chronic obstructive pulmonary disease) (HCC)    Coronary artery disease    Stent   Cyst, dermoid, arm, left 03/26/2018   Depression    Diabetes mellitus    excellent control with a low-dose of a single oral agent   Family history of breast cancer  01/23/2021   Family history of ovarian cancer 01/23/2021   GERD (gastroesophageal reflux disease)    with ulcers   History of right coronary artery stent placement 2000   Hyperlipidemia    Hypertension    Sleep apnea    no cpap   Thalassemia minor    Tobacco abuse, in remission    35 pack years; Quit in 1980    Past Surgical History:  Procedure Laterality Date   BIOPSY  02/28/2020   Procedure: BIOPSY;  Surgeon: [name], DO;  Location: [facility];  Service: Endoscopy;;   CARDIAC CATHETERIZATION     stent   COLONOSCOPY  2006   Dr. Kristie: normal   COLONOSCOPY  2000   Dr. Gardiner: normal    COLONOSCOPY N/A 03/03/2016   Dr. Harvey: diverticulosis, non-bleeding hemorrhoids, redundant left colon.   DILATION AND CURETTAGE OF UTERUS     ESOPHAGOGASTRODUODENOSCOPY  2000   Dr. Gardiner: gastritis    ESOPHAGOGASTRODUODENOSCOPY  2006   Dr. Kristie: small hiatal hernia, gastritis, negative H.pylori    ESOPHAGOGASTRODUODENOSCOPY N/A 03/03/2016   Procedure: ESOPHAGOGASTRODUODENOSCOPY (EGD);  Surgeon: Margo LITTIE Harvey, MD;  Location: [facility];  Service: Endoscopy;  Laterality: N/A;   ESOPHAGOGASTRODUODENOSCOPY N/A 06/03/2016   Dr. Harvey: nonaggressive gastritis due to aspirin  use. Previous ulcers had healed.   ESOPHAGOGASTRODUODENOSCOPY (EGD) WITH PROPOFOL  N/A 02/28/2020   focal inflammation in the gastric antrum consistent with gastritis on biopsies. No H.pylori.    EXCISION OF KELOID Left 03/26/2018   Procedure: EXCISION OF LEFT ARM MASS;  Surgeon: Gail Favorite, MD;  Location: [facility];  Service: General;  Laterality: Left;   GIVENS CAPSULE STUDY N/A 10/10/2020   Procedure: GIVENS CAPSULE STUDY;  Surgeon: [name], DO;  Location: [facility];  Service: Endoscopy;  Laterality: N/A;  7:30am   INGUINAL HERNIA REPAIR  1970s   Right   INGUINAL LYMPH NODE BIOPSY Left 10/21/2023   Procedure: BIOPSY, LYMPH NODE, INGUINAL, OPEN;  Surgeon: Evonnie Dorothyann LABOR, DO;  Location: [facility];  Service: General;  Laterality: Left;   INGUINAL LYMPHADENECTOMY Left 12/29/2023   Procedure: LEFT INGUINOFEMORAL LYMPH NODE REMOVAL;  Surgeon: Eldonna Mays, MD;  Location: [facility];  Service: Gynecology;  Laterality: Left;  LEFT FEMORAL INGUINAL LYMPH NODE DISECTION   ROBOTIC ASSISTED BILATERAL SALPINGO OOPHERECTOMY Bilateral 12/29/2023   Procedure: ROBOTIC ASSISTED LAPAROSCOPIC BILATERAL SALPINGO-OOPHORECTOMY, MINI-LAPAROTOMY FOR OMENTECTOMY;  Surgeon: Eldonna Mays, MD;  Location: [facility];  Service: Gynecology;  Laterality: Bilateral;   TOTAL MASTECTOMY Right 02/20/2021   Procedure: RIGHT TOTAL MASTECTOMY;  Surgeon: Ebbie Cough, MD;  Location: [facility];  Service: General;  Laterality: Right;   VAGINAL HYSTERECTOMY  1980   Unilateral oophorectomy    Family History  Problem Relation Age of Onset   Heart disease Mother        also hypertension and asthma   Lung cancer Mother        dx 66s   Cervical cancer Mother        dx before 39   Alzheimer's disease Maternal Grandmother    Diabetes Half-Sister    Breast cancer Half-Sister 31   Multiple myeloma Nephew 46  war-related exposures   Colon cancer Neg Hx    Colon polyps Neg Hx    Endometrial cancer Neg Hx    Pancreatic cancer Neg Hx    Prostate cancer Neg Hx    Ovarian cancer Neg Hx     Social History   Socioeconomic History   Marital status: Divorced    Spouse name: Not on file   Number of children: 3   Years of education: 12   Highest education level: High school graduate  Occupational History   Occupation: Retired    Comment: Factory work  Tobacco Use   Smoking status: Former    Current packs/day: 0.00    Average packs/day: 1 pack/day for 18.0 years (18.0 ttl pk-yrs)    Types: Cigarettes    Start date: 06/02/1965    Quit date: 06/09/1980    Years since quitting: 43.8   Smokeless tobacco: Never  Vaping Use   Vaping status: Never Used  Substance and Sexual Activity    Alcohol use: No    Alcohol/week: 0.0 standard drinks of alcohol   Drug use: No   Sexual activity: Not Currently  Other Topics Concern   Not on file  Social History Narrative   Lives with daughter    Right handed    Social Drivers of Health   Financial Resource Strain: Low Risk  (06/04/2021)   Overall Financial Resource Strain (CARDIA)    Difficulty of Paying Living Expenses: Not hard at all  Food Insecurity: No Food Insecurity (01/25/2024)   Hunger Vital Sign    Worried About Running Out of Food in the Last Year: Never true    Ran Out of Food in the Last Year: Never true  Transportation Needs: No Transportation Needs (01/25/2024)   PRAPARE - Administrator, Civil Service (Medical): No    Lack of Transportation (Non-Medical): No  Physical Activity: Inactive (06/04/2021)   Exercise Vital Sign    Days of Exercise per Week: 0 days    Minutes of Exercise per Session: 0 min  Stress: Stress Concern Present (06/04/2021)   [employer] of Occupational Health - Occupational Stress Questionnaire    Feeling of Stress : To some extent  Social Connections: Moderately Isolated (12/30/2023)   Social Connection and Isolation Panel    Frequency of Communication with Friends and Family: More than three times a week    Frequency of Social Gatherings with Friends and Family: More than three times a week    Attends Religious Services: More than 4 times per year    Active Member of [employer] or Organizations: No    Attends Banker Meetings: Never    Marital Status: Widowed    Current Medications:  Current Outpatient Medications:    amLODipine  (NORVASC ) 10 MG tablet, TAKE ONE TABLET BY MOUTH EVERYDAY AT BEDTIME, Disp: 90 tablet, Rfl: 3   carvedilol  (COREG ) 6.25 MG tablet, TAKE ONE TABLET BY MOUTH BEFORE BREAKFAST and TAKE ONE TABLET BY MOUTH EVERYDAY AT BEDTIME, Disp: 60 tablet, Rfl: 2   clobetasol cream (TEMOVATE) 0.05 %, Apply topically 2 (two) times daily as needed., Disp: ,  Rfl:    cyanocobalamin  (VITAMIN B12) 1000 MCG tablet, Take 1 tablet (1,000 mcg total) by mouth daily., Disp: 90 tablet, Rfl: 2   docusate sodium  (COLACE) 100 MG capsule, Take 1 capsule (100 mg total) by mouth 2 (two) times daily., Disp: 60 capsule, Rfl: 2   donepezil  (ARICEPT ) 5 MG tablet, Take 1 tablet (5 mg total) by  mouth at bedtime., Disp: 30 tablet, Rfl: 2   feeding supplement (ENSURE ENLIVE / ENSURE PLUS) LIQD, Take 237 mLs by mouth 3 (three) times daily between meals., Disp: 237 mL, Rfl: 12   furosemide (LASIX) 20 MG tablet, Take 20 mg by mouth 2 (two) times daily., Disp: , Rfl:    irbesartan -hydrochlorothiazide  (AVALIDE) 300-12.5 MG tablet, Take 1 tablet by mouth daily before breakfast., Disp: 90 tablet, Rfl: 3   metFORMIN  (GLUCOPHAGE ) 500 MG tablet, Take 1 tablet (500 mg total) by mouth at bedtime., Disp: 30 tablet, Rfl: 0   mirtazapine  (REMERON ) 7.5 MG tablet, TAKE ONE TABLET BY MOUTH AT BEDTIME FOR DEPRESSION AND WEIGHT LOSS, Disp: 30 tablet, Rfl: 2   ONETOUCH ULTRA test strip, SMARTSIG:Strip(s), Disp: , Rfl:    polyethylene glycol powder (GLYCOLAX /MIRALAX ) 17 GM/SCOOP powder, Take by mouth once., Disp: , Rfl:    rosuvastatin  (CRESTOR ) 20 MG tablet, TAKE ONE TABLET BY MOUTH EVERYDAY AT BEDTIME, Disp: 90 tablet, Rfl: 3   triamcinolone ointment (KENALOG) 0.5 %, Apply 1 application  topically 2 (two) times daily as needed (rash)., Disp: , Rfl:    Cholecalciferol (VITAMIN D3) 50 MCG (2000 UT) TABS, TAKE ONE TABLET BY MOUTH daily in addition TO THE 1,000unit FOR total of 3,000units daily (Patient not taking: Reported on 04/13/2024), Disp: 30 tablet, Rfl: 1   ferrous sulfate  325 (65 FE) MG EC tablet, Take 1 tablet (325 mg total) by mouth every other day. (Patient not taking: Reported on 04/13/2024), Disp: 45 tablet, Rfl: 3  Review of Symptoms: Complete 10-system review is positive for: Joint pain, decreased concentration, depression  Physical Exam: BP 136/67 (BP Location: Left Arm, Patient  Position: Sitting)   Pulse 66   Temp 97.6 F (36.4 C) (Oral)   Resp 18   Wt 134 lb (60.8 kg)   SpO2 100%   BMI 25.32 kg/m  General: Alert, oriented, no acute distress. HEENT: Normocephalic, atraumatic. Neck symmetric without masses. Sclera anicteric.  Chest: Normal work of breathing. Clear to auscultation bilaterally.   Cardiovascular: Regular rate and rhythm, no murmurs. Abdomen: Soft, nontender.  Normoactive bowel sounds.  No masses appreciated.  Well-healed incisions. Extremities: Grossly normal range of motion.  Warm, well perfused.  No edema bilaterally. Skin: Some chronic skin changes on bilateral lower extremities Lymphatics: No cervical, supraclavicular, or inguinal adenopathy. GU: Normal appearing external genitalia without erythema, excoriation, or lesions.  Speculum exam reveals atrophic normal appearing vaginal mucosa and cuff.  Bimanual exam reveals smooth cuff, no nodularity or pelvis mass.   Exam chaperoned by Kimberly Jordan, CMA    Laboratory & Radiologic Studies: None

## 2024-04-18 NOTE — Patient Instructions (Signed)
 It was a pleasure to see you in clinic today. - Normal exam today - Let us  know if anything changes - Return visit planned for 3 months  Thank you very much for allowing me to provide care for you today.  I appreciate your confidence in choosing our Gynecologic Oncology team at [facility].  If you have any questions about your visit today please call our office or send us  a MyChart message and we will get back to you as soon as possible.

## 2024-04-19 ENCOUNTER — Ambulatory Visit: Admitting: Neurology

## 2024-04-20 ENCOUNTER — Encounter: Payer: Self-pay | Admitting: Oncology

## 2024-04-20 ENCOUNTER — Inpatient Hospital Stay: Admitting: Oncology

## 2024-04-20 VITALS — BP 131/71 | HR 70 | Temp 97.0°F | Resp 18 | Wt 134.7 lb

## 2024-04-20 DIAGNOSIS — C569 Malignant neoplasm of unspecified ovary: Secondary | ICD-10-CM

## 2024-04-20 DIAGNOSIS — D509 Iron deficiency anemia, unspecified: Secondary | ICD-10-CM | POA: Diagnosis not present

## 2024-04-20 DIAGNOSIS — D0511 Intraductal carcinoma in situ of right breast: Secondary | ICD-10-CM

## 2024-04-20 DIAGNOSIS — C774 Secondary and unspecified malignant neoplasm of inguinal and lower limb lymph nodes: Secondary | ICD-10-CM | POA: Diagnosis not present

## 2024-04-20 NOTE — Progress Notes (Signed)
 Patient Care Team: Nancy Aland, MD as PCP - General (Family Medicine) Nancy, Liu FALCON, MD as PCP - Cardiology (Cardiology) Nancy Callander, MD as Consulting Physician (Hematology) Nancy Cough, MD as Consulting Physician (General Surgery) Nancy Rush, MD as Consulting Physician (Radiation Oncology) Nancy Oh, MD as Consulting Physician (Pulmonary Disease) Nancy Carlin POUR, DO as Consulting Physician (Gastroenterology) Nancy Ozell NOVAK, MD as Consulting Physician (Pulmonary Disease) Nancy Juliene SAUNDERS, DO as Consulting Physician (Neurology) Nancy Lum CROME, NP as Nurse Practitioner (Cardiology) Nancy, Santana LABOR, RN as Registered Nurse  Clinic Day:  04/20/2024  Referring physician: Benjamine Aland, MD   CHIEF COMPLAINT:  CC: Metastatic high-grade serous ovarian carcinoma   ASSESSMENT & PLAN:   Assessment & Plan: Nancy Liu  is a 83 y.o. female with metastatic high-grade serous ovarian carcinoma  Assessment and Plan Assessment & Plan High-grade serous carcinoma  Patient with recent excisional biopsy of inguinal lymph node consistent with high-grade serous carcinoma PET scan with no other sites of disease Identified lesion just above ovarian serous adenocarcinoma 90% probability CEA, CA125: Normal, CA 19-9: Slightly elevated S/p hysterectomy in 1980, unknown if she had oophorectomy at that time 12/2020: Germline mutation testing: Negative Discussed with tumor board and recommendation was to refer to GYN ONC for surgical evaluation S/p robotic assisted bilateral salpingo-oophorectomy, omentectomy, left inguinofemoral lymph node removal.  Pathology consistent with fallopian tube carcinoma.   - We reviewed the PET scan findings together.  Patient does not have any evidence of disease at this time.  Will repeated in 3 months per patient's preference -We discussed that considering patient has stage IV disease considering her metastasis to lymph node.  She would require  adjuvant chemotherapy.  We discussed risk versus benefits of chemotherapy in detail.  Patient decided not get chemotherapy at this time. -We discussed other options of surveillance including frequent examination with GYN ONC and imaging every 3 months.  Patient would like to do both of it at this time.  We discussed the possibility of spacing out the imaging in future if needed    Return to clinic 3 months with PET scan and labs  Ductal carcinoma in situ of right breast Patient has a history of DCIS of right breast diagnosed in 2022 s/p mastectomy.  ER/PR positive.   Did not receive endocrine therapy. Mammogram 10/2022 with no evidence of disease in the left breast. Due for a repeat mammogram  Microcytic anemia Hemoglobin at 11.5 g/dL, improved. Iron  levels slightly low. No constipation reported. - Monitor hemoglobin and iron  levels   The patient understands the plans discussed today and is in agreement with them.  She knows to contact our office if she develops concerns prior to her next appointment.  The total time spent in the appointment was 20 minutes for the encounter with patient, including review of chart and various tests results, discussions about plan of care and coordination of care plan   Nancy Dry, MD  [facility] [place] - A DEPT OF Nancy Liu [facility] [address] [place] [address] Dept: [phone] Dept Fax: [phone]   Orders Placed This Encounter  Procedures   NM PET Image Restag (PS) Skull Base To Thigh    Standing Status:   Future    Expected Date:   07/21/2024    Expiration Date:   04/20/2025    If indicated for the ordered procedure, I authorize the administration of a radiopharmaceutical per Radiology protocol:  Yes    Preferred imaging location?:   Nancy Liu     ONCOLOGY HISTORY:   Oncology History  Ductal carcinoma in situ (DCIS) of right breast  01/01/2021 Mammogram    FINDINGS: Multiple circumscribed oval masses are noted in the MEDIAL portion of the RIGHT breast. Masses vary from 1-4 millimeters in diameter and appear contiguous, spanning 8.9 x 3.6 x 5.1 centimeters. There are faint calcifications spanning 4.2 x 1.9 x 3.5 centimeters in this same, segmental distribution.   01/14/2021 Cancer Staging   Staging form: Breast, AJCC 8th Edition - Clinical stage from 01/14/2021: Stage 0 (cTis (DCIS), cN0, cM0, G2, ER+, PR+, HER2: Not Assessed) - Signed by Nancy Callander, MD on 01/21/2021 Stage prefix: Initial diagnosis Histologic grading system: 3 grade system   01/14/2021 Pathology Results   Diagnosis 1. Breast, right, needle core biopsy, anterior extent - DUCTAL CARCINOMA IN SITU, INTERMEDIATE GRADE WITH CALCIFICATIONS. SEE NOTE 2. Breast, right, needle core biopsy, posterior extent - DUCTAL CARCINOMA IN SITU, INTERMEDIATE GRADE WITH CALCIFICATIONS. SEE NOTE Diagnosis Note 1. and 2. DCIS measures 0.6 cm in part 1 and 0.4 cm in part 2.  1. PROGNOSTIC INDICATORS Results: IMMUNOHISTOCHEMICAL AND MORPHOMETRIC ANALYSIS PERFORMED MANUALLY Estrogen Receptor: >95%, POSITIVE, STRONG STAINING INTENSITY Progesterone Receptor: >95%, POSITIVE, STRONG STAINING INTENSITY     01/17/2021 Initial Diagnosis   Ductal carcinoma in situ (DCIS) of right breast   02/05/2021 Genetic Testing   Negative hereditary cancer genetic testing: no pathogenic variants detected in [employer] CustomNext-Cancer +RNAinsight Panel.  The report date is February 05, 2021.   The CustomNext-Cancer+RNAinsight panel offered by [employer] includes sequencing and rearrangement analysis for the following 47 genes:  APC, ATM, AXIN2, BARD1, BMPR1A, BRCA1, BRCA2, BRIP1, CDH1, CDK4, CDKN2A, CHEK2, DICER1, EPCAM, GREM1, HOXB13, MEN1, MLH1, MSH2, MSH3, MSH6, MUTYH, NBN, NF1, NF2, NTHL1, PALB2, PMS2, POLD1, POLE, PTEN, RAD51C, RAD51D, RECQL, RET, SDHA, SDHAF2, SDHB, SDHC, SDHD, SMAD4, SMARCA4, STK11, TP53, TSC1, TSC2,  and VHL.  RNA data is routinely analyzed for use in variant interpretation for all genes.   02/20/2021 Cancer Staging   Staging form: Breast, AJCC 8th Edition - Pathologic stage from 02/20/2021: No Stage Recommended (pTis (DCIS), pNX, cM1, G2, ER+, PR+, HER2: Not Assessed) - Signed by Nancy Callander, MD on 03/12/2021 Stage prefix: Initial diagnosis Histologic grading system: 3 grade system Residual tumor (R): R0 - None   02/20/2021 Definitive Surgery   FINAL MICROSCOPIC DIAGNOSIS:   A. BREAST, RIGHT, MASTECTOMY:  - Multifocal intermediate grade ductal carcinoma in situ with calcifications.  - Atypical lobular hyperplasia.  - Margins are negative for carcinoma.  - Biopsy site (x2).  - See oncology table.    High grade serous carcinoma (HCC)  10/01/2023 PET scan   IMPRESSION: 1. Hypermetabolic left inguinal lymph node, maximum SUV 9.2. Given the high SUV, neoplastic involvement is a distinct possibility tissue sampling should be considered. 2. Multifocal accentuated activity in the colon is likely physiologic given the lack of correlate of CT findings, although technically nonspecific. 3. Right mastectomy.   10/21/2023 Pathology Results    FINAL MICROSCOPIC DIAGNOSIS:   A. LYMPH NODE, INGUINAL, BIOPSY:  - Metastatic poorly differentiated adenocarcinoma, see comment   Immunohistochemical stains show that the tumor cells are positive for  CK7 and PAX8 with patchy, weak labeling for CK5/6 and p16.  Immunostains for CK20, CDX2, GATA3 and p63 are negative.  This immunoprofile is most  consistent with a gynecologic primary while differential diagnosis can  include a primary renal cell carcinoma but  is less likely.   Immunohistochemical stain for p53 shows a clonal loss of expression pattern, most consistent with metastatic high-grade serous carcinoma.    11/10/2023 Cancer Staging   Staging form: Ovary, AJCC 7th Edition - Clinical stage from 11/10/2023: FIGO Stage IVB, calculated as Stage IV (T1a,  N1, M1) - Signed by Eldonna Mays, MD on 01/18/2024 Stage prefix: Initial diagnosis Source of metastatic specimen: Inguinal Lymph Node Histologic grade (G): G4 Lymph-vascular invasion (LVI): Presence of LVI unknown/indeterminate   11/13/2023 Initial Diagnosis   High grade serous carcinoma (HCC)   11/19/2023 Pathology Results   Cancer type ID: Ovary, 90% probability Serous adenocarcinoma     12/29/2023 Procedure   Robotic assisted BSO, omentectomy, left inguinofemoral lymph node removal   12/29/2023 Pathology Results   FINAL MICROSCOPIC DIAGNOSIS:   A. LEFT INGUINAL LYMPH NODE, EXCISION:  - 1 lymph node negative for carcinoma  - Separate soft tissue fragment with carcinoma.  B. LEFT OVARY AND FALLOPIAN TUBE, RESECTION:  - Serous tubular intraepithelial carcinoma (STIC), fallopian tube  - Unremarkable ovary   C. RIGHT OVARY AND FALLOPIAN TUBE, RESECTION:  - High-grade serous carcinoma of fallopian tube  - Serous tubal intraepithelial carcinoma, fallopian tube  - Unremarkable ovary  - See oncology table   COMMENT: The patient's history of carcinoma of GYN origin metastatic to inguinal lymph node is noted.  Focal areas of the right fallopian tube show high-grade serous carcinoma and serous tubal intraepithelial carcinoma (STIC).  A p53 immunohistochemical stain appears to show clonal loss of expression.  Serous tubal intraepithelial carcinoma is also noted in the left fallopian tube.  The findings are most consistent with high-grade serous carcinoma of fallopian tube origin.     04/14/2024 PET scan   IMPRESSION: 1. Interval resection of previously demonstrated hypermetabolic left inguinal lymph node. 2. No evidence of local recurrence or metastatic disease.       Current Treatment: Surveillance  INTERVAL HISTORY:   Discussed the use of AI scribe software for clinical note transcription with the patient, who gave verbal consent to proceed.  History of Present  Illness Nancy Liu is an 83 year old female who presents for follow-up after a PET scan for high-grade serous carcinoma.  She underwent a recent PET scan which showed no evidence of cancer recurrence. Her cancer was initially detected in a lymph node and later identified in the fallopian tube during surgery.  She experiences balance issues, describing it as 'staggering' rather than falling. She has not yet discussed these symptoms with her primary care provider. She uses a walker and feels she does 'pretty good' with it.  Recent blood work indicates slight anemia with a hemoglobin level of 11.5, which is an improvement from previous levels. Her iron  levels are slightly low, and she is unsure if she is taking iron  supplements.  She reports a good appetite and no other complaints.   I have reviewed the past medical history, past surgical history, social history and family history with the patient and they are unchanged from previous note.  ALLERGIES:  is allergic to protonix  [pantoprazole  sodium].  MEDICATIONS:  Current Outpatient Medications  Medication Sig Dispense Refill   amLODipine  (NORVASC ) 10 MG tablet TAKE ONE TABLET BY MOUTH EVERYDAY AT BEDTIME 90 tablet 3   carvedilol  (COREG ) 6.25 MG tablet TAKE ONE TABLET BY MOUTH BEFORE BREAKFAST and TAKE ONE TABLET BY MOUTH EVERYDAY AT BEDTIME 60 tablet 2   clobetasol cream (TEMOVATE) 0.05 % Apply topically 2 (two) times  daily as needed.     cyanocobalamin  (VITAMIN B12) 1000 MCG tablet Take 1 tablet (1,000 mcg total) by mouth daily. 90 tablet 2   docusate sodium  (COLACE) 100 MG capsule Take 1 capsule (100 mg total) by mouth 2 (two) times daily. 60 capsule 2   donepezil  (ARICEPT ) 5 MG tablet Take 1 tablet (5 mg total) by mouth at bedtime. 30 tablet 2   feeding supplement (ENSURE ENLIVE / ENSURE PLUS) LIQD Take 237 mLs by mouth 3 (three) times daily between meals. 237 mL 12   furosemide (LASIX) 20 MG tablet Take 20 mg by mouth 2  (two) times daily.     irbesartan -hydrochlorothiazide  (AVALIDE) 300-12.5 MG tablet Take 1 tablet by mouth daily before breakfast. 90 tablet 3   metFORMIN  (GLUCOPHAGE ) 500 MG tablet Take 1 tablet (500 mg total) by mouth at bedtime. 30 tablet 0   mirtazapine  (REMERON ) 7.5 MG tablet TAKE ONE TABLET BY MOUTH AT BEDTIME FOR DEPRESSION AND WEIGHT LOSS 30 tablet 2   ONETOUCH ULTRA test strip SMARTSIG:Strip(s)     polyethylene glycol powder (GLYCOLAX /MIRALAX ) 17 GM/SCOOP powder Take by mouth once.     rosuvastatin  (CRESTOR ) 20 MG tablet TAKE ONE TABLET BY MOUTH EVERYDAY AT BEDTIME 90 tablet 3   triamcinolone ointment (KENALOG) 0.5 % Apply 1 application  topically 2 (two) times daily as needed (rash).     Cholecalciferol (VITAMIN D3) 50 MCG (2000 UT) TABS TAKE ONE TABLET BY MOUTH daily in addition TO THE 1,000unit FOR total of 3,000units daily (Patient not taking: Reported on 04/13/2024) 30 tablet 1   ferrous sulfate  325 (65 FE) MG EC tablet Take 1 tablet (325 mg total) by mouth every other day. (Patient not taking: Reported on 04/13/2024) 45 tablet 3   No current facility-administered medications for this visit.    REVIEW OF SYSTEMS:   Constitutional: Denies fevers, chills or abnormal weight loss Eyes: Denies blurriness of vision Ears, nose, mouth, throat, and face: Denies mucositis or sore throat Respiratory: Denies Liu, dyspnea or wheezes Cardiovascular: Denies palpitation, chest discomfort or lower extremity swelling Gastrointestinal:  Denies nausea, heartburn or change in bowel habits Skin: Denies abnormal skin rashes Lymphatics: Denies new lymphadenopathy or easy bruising Neurological:Denies numbness, tingling or new weaknesses Behavioral/Psych: Mood is stable, no new changes  All other systems were reviewed with the patient and are negative.   VITALS:  Blood pressure 131/71, pulse 70, temperature (!) 97 F (36.1 C), resp. rate 18, weight 134 lb 11.2 oz (61.1 kg), SpO2 100%.  Wt  Readings from Last 3 Encounters:  04/20/24 134 lb 11.2 oz (61.1 kg)  04/18/24 134 lb (60.8 kg)  01/22/24 122 lb 1.6 oz (55.4 kg)    Body mass index is 25.45 kg/m.  Performance status (ECOG): 2 - Symptomatic, <50% confined to bed  PHYSICAL EXAM:   GENERAL:alert, no distress and comfortable SKIN: skin color, texture, turgor are normal, no rashes or significant lesions LYMPH:  no palpable lymphadenopathy in the cervical, axillary or inguinal LUNGS: clear to auscultation and percussion with normal breathing effort HEART: regular rate & rhythm and no murmurs  ABDOMEN:abdomen soft, non-tender and normal bowel sounds Musculoskeletal: No edema NEURO: alert & oriented x 3 with fluent speech   LABORATORY DATA:  I have reviewed the data as listed     Component Value Date/Time   NA 142 04/07/2024 0839   NA 141 07/03/2022 1411   K 4.2 04/07/2024 0839   CL 102 04/07/2024 0839   CO2 30 04/07/2024 0839  GLUCOSE 102 (H) 04/07/2024 0839   BUN 18 04/07/2024 0839   BUN 20 07/03/2022 1411   CREATININE 1.07 (H) 04/07/2024 0839   CREATININE 1.22 (H) 01/23/2021 0745   CALCIUM  9.5 04/07/2024 0839   PROT 7.0 04/07/2024 0839   ALBUMIN  4.4 04/07/2024 0839   AST 24 04/07/2024 0839   AST 24 01/23/2021 0745   ALT 16 04/07/2024 0839   ALT 17 01/23/2021 0745   ALKPHOS 62 04/07/2024 0839   BILITOT 0.3 04/07/2024 0839   BILITOT 0.6 01/23/2021 0745   GFRNONAA 52 (L) 04/07/2024 0839   GFRNONAA 45 (L) 01/23/2021 0745   GFRAA 53 (L) 12/13/2019 1020    Lab Results  Component Value Date   WBC 6.4 04/07/2024   NEUTROABS 4.6 04/07/2024   HGB 11.5 (L) 04/07/2024   HCT 37.6 04/07/2024   MCV 80.3 04/07/2024   PLT 257 04/07/2024      Chemistry      Component Value Date/Time   NA 142 04/07/2024 0839   NA 141 07/03/2022 1411   K 4.2 04/07/2024 0839   CL 102 04/07/2024 0839   CO2 30 04/07/2024 0839   BUN 18 04/07/2024 0839   BUN 20 07/03/2022 1411   CREATININE 1.07 (H) 04/07/2024 0839    CREATININE 1.22 (H) 01/23/2021 0745      Component Value Date/Time   CALCIUM  9.5 04/07/2024 0839   ALKPHOS 62 04/07/2024 0839   AST 24 04/07/2024 0839   AST 24 01/23/2021 0745   ALT 16 04/07/2024 0839   ALT 17 01/23/2021 0745   BILITOT 0.3 04/07/2024 0839   BILITOT 0.6 01/23/2021 0745      Latest Reference Range & Units 04/07/24 08:39  Iron  28 - 170 ug/dL 60  UIBC ug/dL 746  TIBC 749 - 549 ug/dL 685  Saturation Ratios 10.4 - 31.8 % 19  Ferritin 11 - 307 ng/mL 99    RADIOGRAPHIC STUDIES: I have personally reviewed the radiological images as listed and agreed with the findings in the report  NM PET Image Initial (PI) Skull Base To Thigh CLINICAL DATA:  Subsequent treatment strategy for fallopian tube cancer (high grade serous carcinoma).  EXAM: NUCLEAR MEDICINE PET SKULL BASE TO THIGH  TECHNIQUE: 6.01 mCi F-18 FDG was injected intravenously. Full-ring PET imaging was performed from the skull base to thigh after the radiotracer. CT data was obtained and used for attenuation correction and anatomic localization.  Fasting blood glucose: 102 mg/dl  COMPARISON:  PET-CT 94/98/7974. CT of the chest, abdomen and pelvis 09/06/2023.  FINDINGS: Mediastinal blood pool activity: SUV max 1.7  NECK:  No hypermetabolic cervical lymph nodes are identified. No suspicious activity identified within the pharyngeal mucosal space.  Incidental CT findings: Bilateral carotid atherosclerosis.  CHEST:  There are no hypermetabolic mediastinal, hilar or axillary lymph nodes. No hypermetabolic pulmonary activity or suspicious nodularity.  Incidental CT findings: Status post right mastectomy. Atherosclerosis of the aorta, great vessels and coronary arteries. Mildly increased dependent atelectasis at both lung bases. No confluent airspace disease.  ABDOMEN/PELVIS:  There is no hypermetabolic activity within the liver, adrenal glands, spleen or pancreas. There is no hypermetabolic  nodal activity in the abdomen or pelvis. Interval resection of the previously demonstrated hypermetabolic left inguinal lymph node with associated surgical clips. Scattered bowel activity within physiologic limits. No ascites or definite peritoneal disease identified.  Incidental CT findings: Status post hysterectomy. Aortoiliac atherosclerosis.  SKELETON:  There is no hypermetabolic activity to suggest osseous metastatic disease.  Incidental CT findings: none  IMPRESSION: 1. Interval resection of previously demonstrated hypermetabolic left inguinal lymph node. 2. No evidence of local recurrence or metastatic disease. 3.  Aortic Atherosclerosis (ICD10-I70.0).  Electronically Signed   By: Elsie Perone M.D.   On: 04/16/2024 19:04

## 2024-04-21 ENCOUNTER — Encounter: Payer: Self-pay | Admitting: *Deleted

## 2024-04-21 NOTE — Progress Notes (Signed)
 DME ordered via [employer] - Pharmacist, community.

## 2024-04-26 ENCOUNTER — Other Ambulatory Visit: Payer: Self-pay

## 2024-04-26 NOTE — Patient Instructions (Signed)
 Visit Information  Thank you for taking time to visit with me today. Please don't hesitate to contact me if I can be of assistance to you before our next scheduled appointment.  Your next care management appointment is by telephone on Tuesday, December 23rd at 3:00pm.   Please call the care guide team at [phone] if you need to cancel, schedule, or reschedule an appointment.   Please call the [place]  National Suicide Prevention Lifeline: (430)440-1670 or TTY: [phone] TTY (661)[phone]) to talk to a trained counselor if you are experiencing a Mental Health or Behavioral Health Crisis or need someone to talk to.  Santana Stamp BSN, CCM [place]  VBCI Population Health RN Care Manager Direct Dial: [phone]  Fax: [phone]

## 2024-04-26 NOTE — Patient Outreach (Signed)
 Complex Care Management   Visit Note  04/26/2024  Name:  Nancy Liu MRN: [id number] DOB: [birth year]  Situation: Referral received for Complex Care Management related to Failure to Thrive. I obtained verbal consent from Caregiver.  Visit completed with daughter/DPR, [name]  on the phone. 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   Background:   Past Medical History:  Diagnosis Date   Anxiety    Arteriosclerotic cardiovascular disease (ASCVD) 2001   Bare-metal stent placed in the right coronary artery in 12/01; residual 50% lesion of the first diagonal and mid LAD   Arthritis    Breast cancer (HCC)    right 2021   Cerebrovascular disease    Chronic kidney disease    COPD (chronic obstructive pulmonary disease) (HCC)    Coronary artery disease    Stent   Cyst, dermoid, arm, left 03/26/2018   Depression    Diabetes mellitus    excellent control with a low-dose of a single oral agent   Family history of breast cancer 01/23/2021   Family history of ovarian cancer 01/23/2021   GERD (gastroesophageal reflux disease)    with ulcers   History of right coronary artery stent placement 2000   Hyperlipidemia    Hypertension    Sleep apnea    no cpap   Thalassemia minor    Tobacco abuse, in remission    35 pack years; Quit in 1980    Assessment: Patient Reported Symptoms:  Cognitive Cognitive Status: Able to follow simple commands, Struggling with memory recall, Normal speech and language skills, Requires Assistance Decision Making, Other: Other: Neuro-cognitive disorder. Daughter reports patient has been getting up in the middle of  the night for the past three nights being confused, saying someone is in the house.  Daughter will keep track of how often this is happening, and will monitor any  changes during the day   Health Maintenance Behaviors: Annual physical exam  Neurological Neurological Review of Symptoms: No symptoms reported    HEENT HEENT Symptoms Reported: No symptoms reported      Cardiovascular Cardiovascular Symptoms Reported: No symptoms reported Does patient have uncontrolled Hypertension?: No Weight: 134 lb (60.8 kg) (Daughter reported.) Cardiovascular Self-Management Outcome: 4 (good)  Respiratory Respiratory Symptoms Reported: No symptoms reported    Endocrine Endocrine Symptoms Reported: No symptoms reported Is patient diabetic?: Yes Endocrine Self-Management Outcome: 5 (very good)  Gastrointestinal Gastrointestinal Symptoms Reported: No symptoms reported      Genitourinary Genitourinary Symptoms Reported: No symptoms reported    Integumentary Integumentary Symptoms Reported: No symptoms reported    Musculoskeletal   Musculoskeletal Comment: She is making her bed, going for walks with her daughter. Falls in the past year?: No    Psychosocial Psychosocial Symptoms Reported: No symptoms reported          04/26/2024    PHQ2-9 Depression Screening   Little interest or pleasure in doing things    Feeling down, depressed, or hopeless    PHQ-2 - Total Score    Trouble falling or staying asleep, or sleeping too much    Feeling tired or having little energy    Poor appetite or overeating     Feeling bad about yourself - or that you are a failure or have let yourself or your family down    Trouble concentrating on things, such as reading  the newspaper or watching television    Moving or speaking so slowly that other people could have noticed.  Or the opposite - being so fidgety or restless that you have been moving around a lot more than usual    Thoughts that you would be better off dead, or hurting yourself in some way    PHQ2-9 Total Score    If you checked off any problems, how difficult have these problems made it for you to do your work, take  care of things at home, or get along with other people    Depression Interventions/Treatment      Today's Vitals   04/26/24 1510  Weight: 134 lb (60.8 kg)   Pain Scale: 0-10 Pain Score: 0-No pain  Medications Reviewed Today     Reviewed by Lucian Santana LABOR, RN (Registered Nurse) on 04/26/24 at 1545  Med List Status: <None>   Medication Order Taking? Sig Documenting Provider Last Dose Status Informant  amLODipine  (NORVASC ) 10 MG tablet [id number]  TAKE ONE TABLET BY MOUTH EVERYDAY AT BEDTIME Alvan Dorn FALCON, MD  Active Family Member  aspirin  EC 81 MG tablet [id number] Yes Take 81 mg by mouth daily. Swallow whole. [provider]  Active   carvedilol  (COREG ) 6.25 MG tablet [id number]  TAKE ONE TABLET BY MOUTH BEFORE BREAKFAST and TAKE ONE TABLET BY MOUTH EVERYDAY AT BEDTIME Ricky Fines, MD  Active Family Member  Cholecalciferol (VITAMIN D3) 50 MCG (2000 UT) TABS [id number]  TAKE ONE TABLET BY MOUTH daily in addition TO THE 1,000unit FOR total of 3,000units daily  Patient not taking: Reported on 04/13/2024   Lamon Herter M, PA-C  Active Family Member  clobetasol cream (TEMOVATE) 0.05 % [id number]  Apply topically 2 (two) times daily as needed. [provider]  Active   cyanocobalamin  (VITAMIN B12) 1000 MCG tablet [id number]  Take 1 tablet (1,000 mcg total) by mouth daily. Davonna Siad, MD  Active Family Member  docusate sodium  (COLACE) 100 MG capsule [id number]  Take 1 capsule (100 mg total) by mouth 2 (two) times daily. Pappayliou, Dorothyann LABOR, DO  Active Family Member           Med Note ALYNE, SUZEN ORN   Tue Dec 29, 2023 10:51 AM) Not started   donepezil  (ARICEPT ) 5 MG tablet [id number]  Take 1 tablet (5 mg total) by mouth at bedtime. [name], DO  Active   feeding supplement (ENSURE ENLIVE / ENSURE PLUS) LIQD [id number]  Take 237 mLs by mouth 3 (three) times daily between meals. Maree Bracken D, DO  Active Family Member  ferrous sulfate  325 (65 FE) MG EC  tablet [id number]  Take 1 tablet (325 mg total) by mouth every other day.  Patient not taking: Reported on 04/13/2024   Davonna Siad, MD  Active Family Member  furosemide (LASIX) 20 MG tablet [id number]  Take 20 mg by mouth 2 (two) times daily. [provider]  Active            Med Note LESLY, RICHERD CINDERELLA Kitchens Jan 25, 2024  3:49 PM) Started today  irbesartan -hydrochlorothiazide  (AVALIDE) 300-12.5 MG tablet [id number]  Take 1 tablet by mouth daily before breakfast. Maree Bracken D, DO  Active Family Member  metFORMIN  (GLUCOPHAGE ) 500 MG tablet [id number]  Take 1 tablet (500 mg total) by mouth at bedtime. Maree Bracken D, DO  Active Family Member  mirtazapine  (REMERON ) 7.5 MG tablet [id number]  TAKE ONE TABLET BY MOUTH AT BEDTIME FOR DEPRESSION AND  WEIGHT LOSS Lamon Pleasant CHRISTELLA DEVONNA  Active   [facility] ULTRA test strip [id number]  SMARTSIG:Strip(s) [provider]  Active Family Member  polyethylene glycol powder (GLYCOLAX /MIRALAX ) 17 GM/SCOOP powder [id number]  Take by mouth once. [provider]  Active   rosuvastatin  (CRESTOR ) 20 MG tablet [id number]  TAKE ONE TABLET BY MOUTH EVERYDAY AT BEDTIME Alvan Dorn FALCON, MD  Active Family Member  triamcinolone ointment (KENALOG) 0.5 % [id number]  Apply 1 application  topically 2 (two) times daily as needed (rash). [provider]  Active Family Member           Med Note JERALYN DUNCANS A   Tue Oct 13, 2023 11:21 AM)    Med List Note Lesly, Richerd GRADE, [place] 01/01/24 1623): 01/01/24 spoke with daughter [name] and reviewed via AVS from hospital and updated   Pt's son Loman helps with meds [phone]            Recommendation:   Specialty provider follow-up : Neurology 05/11/24 Continue Current Plan of Care Daughter has PCA resources given by BSW, no need for extra assistance as of today but will keep the resources for future needs.   Follow Up Plan:   Telephone follow-up in 1 month  Santana Stamp BSN, CCM [place]  [facility] Population Health RN Care Manager Direct Dial: [phone]  Fax: [phone]

## 2024-05-10 NOTE — Progress Notes (Deleted)
 NEUROLOGY FOLLOW UP OFFICE NOTE  CHELCIE ESTORGA [id number]  Assessment/Plan:   Major neurocognitive disorder of unclear etiology.  She had a positive DaTscan  but clinical presentation not consistent with Parkinson's disease. Tremor.  Despite positive DaTscan , presentation not consistent with a parkinson's tremor and believe more likely to be an essential tremor.    Donepezil  10mg  at bedtime ***  Total time spent in chart and face to face with patient and family:  ***  Subjective:  Nancy Liu is an 83 year old right-handed female with CAD, HTN, HLD, DM II, cerebrovascular disease, Thalassemia minor who follows up for dementia and tremor.  Accompanied by her daughter and son who supplements history.    UPDATE: Current medications:  Donepezil  10mg  at bedtime  DaTscan  on 09/11/2023 demonstrated decreased striatal loflupane activity in the putamen and heads of the caudate nuclei with greater loss on the right compared to the left, which may be seen in Parkinsonian syndromes.  However, her clinical presentation did not seem parkinsonian and her tremor was not characteristic for a Parkinson's tremor.  If tremor was disruptive, recommended first trying primidone  first ***    HISTORY: Symptoms started when Covid pandemic happened.  Increased depression.  Nightmares.  Decreased appetite and lost weight.  She reports problems with balance, meaning she stumbles and feels lightheaded.  Being out in the heat and sun aggravates it .  When she is eating sometimes, her hand is shaking if she is holding utensils.  She may notice it in the left hand too. Needs to hold it with her other hand to stop it.  When sitting, her legs may start shaking.  She also reports cramps in feet.  MRI of brain 10/03/2022 showed mild chronic small vessel ischemic changes.  No family history of tremor.    In January 2025, she had become increasingly confused and forgetful.  She would frequently misplace objects,  confuse names of items but would also not know where she was, even at home.  She was also not taking her medications correctly.  On 06/30/2023, she appeared to have some facial asymmetry, so she was admitted to [facility].  Because family endorsed facial asymmetry, she was a stroke code.  NIH stroke scale was 0.  CT head was negative.  CTA head and neck showed mild stenosis in the proximal right M2 and P1-P2 junction, and right vertebral artery not visualized from its origin through distal V2 segment where it likely reconstitutes via collaterals and diminutive right V3 segment but no emergent LVO.  MRI of brain again showed mild chronic small vessel ischemic changes in the cerebral white matter, but no acute findings.  EEG revealed moderate diffuse slowing but no seizures or epileptiform discharges.  Labs revealed slight worsening of kidney function (Cr 1.21 and GFR 45).  Otherwise, labs largely normal/unremarkable, including CBC, CMP, TSH, B12, ammonia and UDS.  Due to worsening tremor, she was prescribed primidone  but she has not picked it up yet.  For the past 3 months, she has been losing more weight due to poor appetite.  She may be agitated when she has difficulty explaining things but she isn't combative.  She does experience hallucinations. Sometimes she sees shadows or she may hear children outside the window who aren't there.  She has been able to perform ADLs such as bathing, dressing and using the toilet independently.  She underwent neuropsychological evaluation on 2/11, which demonstrated cognitive impairment of multiple domains including memory, language, visuospatial functioning,  processing speed, and aspects of executive functioning meeting diagnosis of mild dementia but without clear etiology.  Findings may be complicated by residual metabolic encephalopathy.    PAST MEDICAL HISTORY: Past Medical History:  Diagnosis Date   Anxiety    Arteriosclerotic cardiovascular disease (ASCVD)  2001   Bare-metal stent placed in the right coronary artery in 12/01; residual 50% lesion of the first diagonal and mid LAD   Arthritis    Breast cancer (HCC)    right 2021   Cerebrovascular disease    Chronic kidney disease    COPD (chronic obstructive pulmonary disease) (HCC)    Coronary artery disease    Stent   Cyst, dermoid, arm, left 03/26/2018   Depression    Diabetes mellitus    excellent control with a low-dose of a single oral agent   Family history of breast cancer 01/23/2021   Family history of ovarian cancer 01/23/2021   GERD (gastroesophageal reflux disease)    with ulcers   History of right coronary artery stent placement 2000   Hyperlipidemia    Hypertension    Sleep apnea    no cpap   Thalassemia minor    Tobacco abuse, in remission    35 pack years; Quit in 1980    MEDICATIONS: Current Outpatient Medications on File Prior to Visit  Medication Sig Dispense Refill   amLODipine  (NORVASC ) 10 MG tablet TAKE ONE TABLET BY MOUTH EVERYDAY AT BEDTIME 90 tablet 3   aspirin  EC 81 MG tablet Take 81 mg by mouth daily. Swallow whole.     carvedilol  (COREG ) 6.25 MG tablet TAKE ONE TABLET BY MOUTH BEFORE BREAKFAST and TAKE ONE TABLET BY MOUTH EVERYDAY AT BEDTIME 60 tablet 2   Cholecalciferol (VITAMIN D3) 50 MCG (2000 UT) TABS TAKE ONE TABLET BY MOUTH daily in addition TO THE 1,000unit FOR total of 3,000units daily (Patient not taking: Reported on 04/13/2024) 30 tablet 1   clobetasol cream (TEMOVATE) 0.05 % Apply topically 2 (two) times daily as needed.     cyanocobalamin  (VITAMIN B12) 1000 MCG tablet Take 1 tablet (1,000 mcg total) by mouth daily. 90 tablet 2   docusate sodium  (COLACE) 100 MG capsule Take 1 capsule (100 mg total) by mouth 2 (two) times daily. 60 capsule 2   donepezil  (ARICEPT ) 5 MG tablet Take 1 tablet (5 mg total) by mouth at bedtime. 30 tablet 2   feeding supplement (ENSURE ENLIVE / ENSURE PLUS) LIQD Take 237 mLs by mouth 3 (three) times daily between  meals. 237 mL 12   ferrous sulfate  325 (65 FE) MG EC tablet Take 1 tablet (325 mg total) by mouth every other day. (Patient not taking: Reported on 04/13/2024) 45 tablet 3   furosemide (LASIX) 20 MG tablet Take 20 mg by mouth 2 (two) times daily.     irbesartan -hydrochlorothiazide  (AVALIDE) 300-12.5 MG tablet Take 1 tablet by mouth daily before breakfast. 90 tablet 3   metFORMIN  (GLUCOPHAGE ) 500 MG tablet Take 1 tablet (500 mg total) by mouth at bedtime. 30 tablet 0   mirtazapine  (REMERON ) 7.5 MG tablet TAKE ONE TABLET BY MOUTH AT BEDTIME FOR DEPRESSION AND WEIGHT LOSS 30 tablet 2   ONETOUCH ULTRA test strip SMARTSIG:Strip(s)     polyethylene glycol powder (GLYCOLAX /MIRALAX ) 17 GM/SCOOP powder Take by mouth once.     rosuvastatin  (CRESTOR ) 20 MG tablet TAKE ONE TABLET BY MOUTH EVERYDAY AT BEDTIME 90 tablet 3   triamcinolone ointment (KENALOG) 0.5 % Apply 1 application  topically 2 (two) times daily  as needed (rash).     No current facility-administered medications on file prior to visit.    ALLERGIES: Allergies  Allergen Reactions   Protonix  [Pantoprazole  Sodium] Other (See Comments)    DRY LIPS    FAMILY HISTORY: Family History  Problem Relation Age of Onset   Heart disease Mother        also hypertension and asthma   Lung cancer Mother        dx 32s   Cervical cancer Mother        dx before 67   Alzheimer's disease Maternal Grandmother    Diabetes Half-Sister    Breast cancer Half-Sister 62   Multiple myeloma Nephew 53       war-related exposures   Colon cancer Neg Hx    Colon polyps Neg Hx    Endometrial cancer Neg Hx    Pancreatic cancer Neg Hx    Prostate cancer Neg Hx    Ovarian cancer Neg Hx       Objective:  *** General: No acute distress.  Patient appears ***-groomed.   Head:  Normocephalic/atraumatic Eyes:  Fundi examined but not visualized Neck: supple, no paraspinal tenderness, full range of motion Heart:  Regular rate and rhythm Neurological Exam: alert  and oriented.  Speech fluent and not dysarthric, language intact.  CN II-XII intact. Bulk and tone normal, muscle strength 5/5 throughout.  Sensation to light touch intact.  Deep tendon reflexes 2+ throughout, toes downgoing.  Finger to nose testing intact.  Gait normal, Romberg negative.   Juliene Dunnings, DO  CC: ***

## 2024-05-11 ENCOUNTER — Ambulatory Visit: Admitting: Neurology

## 2024-05-11 ENCOUNTER — Other Ambulatory Visit: Payer: Self-pay | Admitting: Neurology

## 2024-05-11 NOTE — Telephone Encounter (Signed)
 Patient daughter advised patient has to keep her appt in January to get further refills. Patient has rescheduled or cancelled four times since new refills sent in 02/2024.

## 2024-05-19 ENCOUNTER — Telehealth: Payer: Self-pay | Admitting: *Deleted

## 2024-05-19 NOTE — Telephone Encounter (Signed)
 Spoke with the patient's daughter and moved appt from 2/16 to 3/2

## 2024-05-24 ENCOUNTER — Other Ambulatory Visit: Payer: Self-pay

## 2024-05-24 NOTE — Patient Outreach (Signed)
 Complex Care Management   Visit Note  05/24/2024  Name:  Nancy Liu MRN: [id number] DOB: [birth year]  Situation: Referral received for Complex Care Management related to DM and impaired mobility I obtained verbal consent from Caregiver.  Visit completed with [name], daughter  on the phone  Background:   Past Medical History:  Diagnosis Date   Anxiety    Arteriosclerotic cardiovascular disease (ASCVD) 2001   Bare-metal stent placed in the right coronary artery in 12/01; residual 50% lesion of the first diagonal and mid LAD   Arthritis    Breast cancer (HCC)    right 2021   Cerebrovascular disease    Chronic kidney disease    COPD (chronic obstructive pulmonary disease) (HCC)    Coronary artery disease    Stent   Cyst, dermoid, arm, left 03/26/2018   Depression    Diabetes mellitus    excellent control with a low-dose of a single oral agent   Family history of breast cancer 01/23/2021   Family history of ovarian cancer 01/23/2021   GERD (gastroesophageal reflux disease)    with ulcers   History of right coronary artery stent placement 2000   Hyperlipidemia    Hypertension    Sleep apnea    no cpap   Thalassemia minor    Tobacco abuse, in remission    35 pack years; Quit in 1980    Assessment: Patient Reported Symptoms:  Cognitive Cognitive Status: Able to follow simple commands, Requires Assistance Decision Making, Normal speech and language skills, Struggling with memory recall      Neurological Neurological Review of Symptoms: Not assessed    HEENT HEENT Symptoms Reported: Not assessed      Cardiovascular Cardiovascular Symptoms Reported: Not assessed    Respiratory Respiratory Symptoms Reported: No symptoms reported    Endocrine Endocrine Symptoms Reported: Not assessed    Gastrointestinal Gastrointestinal Symptoms Reported: Change in appetite Additional Gastrointestinal Details: Pt is eating much better and maintaining weight at this time       Genitourinary Genitourinary Symptoms Reported: Not assessed    Integumentary Integumentary Symptoms Reported: Not assessed    Musculoskeletal Musculoskelatal Symptoms Reviewed: No symptoms reported Additional Musculoskeletal Details: going out and walking more without any issues        Psychosocial Psychosocial Symptoms Reported: Not assessed          05/24/2024    PHQ2-9 Depression Screening   Little interest or pleasure in doing things    Feeling down, depressed, or hopeless    PHQ-2 - Total Score    Trouble falling or staying asleep, or sleeping too much    Feeling tired or having little energy    Poor appetite or overeating     Feeling bad about yourself - or that you are a failure or have let yourself or your family down    Trouble concentrating on things, such as reading the newspaper or watching television    Moving or speaking so slowly that other people could have noticed.  Or the opposite - being so fidgety or restless that you have been moving around a lot more than usual    Thoughts that you would be better off dead, or hurting yourself in some way    PHQ2-9 Total Score    If you checked off any problems, how difficult have these problems made it for you to do your work, take care of things at home, or get along with other people    Depression Interventions/Treatment  There were no vitals filed for this visit. Pain Score: 0-No pain  Medications Reviewed Today   Medications were not reviewed in this encounter     Recommendation:   Specialty provider follow-up 06/08/2024 with Dr. Richie ,Neurology Continue Current Plan of Care: [name] (daughter) has RNCM number and encouraged to call if any needs arise. Informed her that if any concerns arise over the holidays or a weekend she can call her providers office and choose the on call option and someone will get back to her. It is easier to catch things early and intervene instead of letting things go until the  office is open to prevent trip to the hospital.   Follow Up Plan:   Telephone follow up appointment date/time:  06/22/2024 @2pm   Elida Pulse, RNCM Case Manager [facility], Population Health Direct Dial: [phone]

## 2024-05-24 NOTE — Patient Instructions (Signed)
 Visit Information  Thank you for taking time to visit with me today. Please don't hesitate to contact me if I can be of assistance to you before our next scheduled appointment.  Your next care management appointment is by telephone on 06/22/2024 at 2pm   Please call the care guide team at [phone] if you need to cancel, schedule, or reschedule an appointment.   Please call the [place]  National Suicide Prevention Lifeline: 401-180-0017 or TTY: [phone] TTY [phone]) to talk to a trained counselor if you are experiencing a Mental Health or Behavioral Health Crisis or need someone to talk to.  Elida Pulse, RNCM Case Manager [facility], Population Health Direct Dial: [phone]

## 2024-05-30 ENCOUNTER — Encounter: Payer: Self-pay | Admitting: *Deleted

## 2024-06-06 ENCOUNTER — Institutional Professional Consult (permissible substitution): Payer: PPO | Admitting: Psychology

## 2024-06-06 ENCOUNTER — Ambulatory Visit: Payer: Self-pay

## 2024-06-07 NOTE — Progress Notes (Signed)
 "  NEUROLOGY FOLLOW UP OFFICE NOTE  Nancy LOUGHMILLER [id number]  Assessment/Plan:   Major neurocognitive disorder of unclear etiology but likely Alzheimer's, stable.  She had a positive DaTscan  but clinical presentation not consistent with Parkinson's disease. Tremor.  Despite positive DaTscan , presentation not consistent with a parkinson's tremor and believe more likely to be an essential tremor.    Donepezil  10mg  at bedtime  Follow up 9 months.   Subjective:  Nancy Liu is an 84 year old right-handed female with CAD, HTN, HLD, DM II, cerebrovascular disease, Thalassemia minor who follows up for dementia and tremor.  Accompanied by her daughter and son who supplements history.    UPDATE: Current medications:  Donepezil  10mg  at bedtime  DaTscan  on 09/11/2023 demonstrated decreased striatal loflupane activity in the putamen and heads of the caudate nuclei with greater loss on the right compared to the left, which may be seen in Parkinsonian syndromes.  However, her clinical presentation did not seem parkinsonian and her tremor was not characteristic for a Parkinson's tremor.  If tremor was disruptive, recommended first trying primidone  first. Tremors have not been too bothersome.  Notices it sometimes when she eats.  Does not disrupt other activities such as dressing.  Denies trouble swallowing.  Memory slightly worse.    HISTORY: Symptoms started when Covid pandemic happened.  Increased depression.  Nightmares.  Decreased appetite and lost weight.  She reports problems with balance, meaning she stumbles and feels lightheaded.  Being out in the heat and sun aggravates it .  When she is eating sometimes, her hand is shaking if she is holding utensils.  She may notice it in the left hand too. Needs to hold it with her other hand to stop it.  When sitting, her legs may start shaking.  She also reports cramps in feet.  MRI of brain 10/03/2022 showed mild chronic small vessel ischemic  changes.  No family history of tremor.    In January 2025, she had become increasingly confused and forgetful.  She would frequently misplace objects, confuse names of items but would also not know where she was, even at home.  She was also not taking her medications correctly.  On 06/30/2023, she appeared to have some facial asymmetry, so she was admitted to [facility].  Because family endorsed facial asymmetry, she was a stroke code.  NIH stroke scale was 0.  CT head was negative.  CTA head and neck showed mild stenosis in the proximal right M2 and P1-P2 junction, and right vertebral artery not visualized from its origin through distal V2 segment where it likely reconstitutes via collaterals and diminutive right V3 segment but no emergent LVO.  MRI of brain again showed mild chronic small vessel ischemic changes in the cerebral white matter, but no acute findings.  EEG revealed moderate diffuse slowing but no seizures or epileptiform discharges.  Labs revealed slight worsening of kidney function (Cr 1.21 and GFR 45).  Otherwise, labs largely normal/unremarkable, including CBC, CMP, TSH, B12, ammonia and UDS.  Due to worsening tremor, she was prescribed primidone  but she has not picked it up yet.  For the past 3 months, she has been losing more weight due to poor appetite.  She may be agitated when she has difficulty explaining things but she isn't combative.  She does experience hallucinations. Sometimes she sees shadows or she may hear children outside the window who aren't there.  She has been able to perform ADLs such as bathing, dressing and  using the toilet independently.  She underwent neuropsychological evaluation on 2/11, which demonstrated cognitive impairment of multiple domains including memory, language, visuospatial functioning, processing speed, and aspects of executive functioning meeting diagnosis of mild dementia but without clear etiology.  Findings may be complicated by residual  metabolic encephalopathy.    PAST MEDICAL HISTORY: Past Medical History:  Diagnosis Date   Anxiety    Arteriosclerotic cardiovascular disease (ASCVD) 2001   Bare-metal stent placed in the right coronary artery in 12/01; residual 50% lesion of the first diagonal and mid LAD   Arthritis    Breast cancer (HCC)    right 2021   Cerebrovascular disease    Chronic kidney disease    COPD (chronic obstructive pulmonary disease) (HCC)    Coronary artery disease    Stent   Cyst, dermoid, arm, left 03/26/2018   Depression    Diabetes mellitus    excellent control with a low-dose of a single oral agent   Family history of breast cancer 01/23/2021   Family history of ovarian cancer 01/23/2021   GERD (gastroesophageal reflux disease)    with ulcers   History of right coronary artery stent placement 2000   Hyperlipidemia    Hypertension    Sleep apnea    no cpap   Thalassemia minor    Tobacco abuse, in remission    35 pack years; Quit in 1980    MEDICATIONS: Current Outpatient Medications on File Prior to Visit  Medication Sig Dispense Refill   amLODipine  (NORVASC ) 10 MG tablet TAKE ONE TABLET BY MOUTH EVERYDAY AT BEDTIME 90 tablet 3   aspirin  EC 81 MG tablet Take 81 mg by mouth daily. Swallow whole.     carvedilol  (COREG ) 6.25 MG tablet TAKE ONE TABLET BY MOUTH BEFORE BREAKFAST and TAKE ONE TABLET BY MOUTH EVERYDAY AT BEDTIME 60 tablet 2   Cholecalciferol (VITAMIN D3) 50 MCG (2000 UT) TABS TAKE ONE TABLET BY MOUTH daily in addition TO THE 1,000unit FOR total of 3,000units daily (Patient not taking: Reported on 04/13/2024) 30 tablet 1   clobetasol cream (TEMOVATE) 0.05 % Apply topically 2 (two) times daily as needed.     cyanocobalamin  (VITAMIN B12) 1000 MCG tablet Take 1 tablet (1,000 mcg total) by mouth daily. 90 tablet 2   docusate sodium  (COLACE) 100 MG capsule Take 1 capsule (100 mg total) by mouth 2 (two) times daily. 60 capsule 2   donepezil  (ARICEPT ) 5 MG tablet Take 1 tablet (5  mg total) by mouth at bedtime. 30 tablet 2   feeding supplement (ENSURE ENLIVE / ENSURE PLUS) LIQD Take 237 mLs by mouth 3 (three) times daily between meals. 237 mL 12   ferrous sulfate  325 (65 FE) MG EC tablet Take 1 tablet (325 mg total) by mouth every other day. (Patient not taking: Reported on 04/13/2024) 45 tablet 3   furosemide (LASIX) 20 MG tablet Take 20 mg by mouth 2 (two) times daily.     irbesartan -hydrochlorothiazide  (AVALIDE) 300-12.5 MG tablet Take 1 tablet by mouth daily before breakfast. 90 tablet 3   metFORMIN  (GLUCOPHAGE ) 500 MG tablet Take 1 tablet (500 mg total) by mouth at bedtime. 30 tablet 0   mirtazapine  (REMERON ) 7.5 MG tablet TAKE ONE TABLET BY MOUTH AT BEDTIME FOR DEPRESSION AND WEIGHT LOSS 30 tablet 2   ONETOUCH ULTRA test strip SMARTSIG:Strip(s)     polyethylene glycol powder (GLYCOLAX /MIRALAX ) 17 GM/SCOOP powder Take by mouth once.     rosuvastatin  (CRESTOR ) 20 MG tablet TAKE ONE TABLET BY  MOUTH EVERYDAY AT BEDTIME 90 tablet 3   triamcinolone ointment (KENALOG) 0.5 % Apply 1 application  topically 2 (two) times daily as needed (rash).     No current facility-administered medications on file prior to visit.    ALLERGIES: Allergies  Allergen Reactions   Protonix  [Pantoprazole  Sodium] Other (See Comments)    DRY LIPS    FAMILY HISTORY: Family History  Problem Relation Age of Onset   Heart disease Mother        also hypertension and asthma   Lung cancer Mother        dx 91s   Cervical cancer Mother        dx before 5   Alzheimer's disease Maternal Grandmother    Diabetes Half-Sister    Breast cancer Half-Sister 75   Multiple myeloma Nephew 53       war-related exposures   Colon cancer Neg Hx    Colon polyps Neg Hx    Endometrial cancer Neg Hx    Pancreatic cancer Neg Hx    Prostate cancer Neg Hx    Ovarian cancer Neg Hx       Objective:  Blood pressure 119/75, pulse 79, height 5' 2 (1.575 m), weight 135 lb (61.2 kg), SpO2 100%. General: No  acute distress.  Patient appears well-groomed.   Head:  Normocephalic/atraumatic Eyes:  Fundi examined but not visualized Neck: supple, no paraspinal tenderness, full range of motion Heart:  Regular rate and rhythm Neurological Exam: alert and oriented.  Speech fluent and not dysarthric, language intact.      07/03/2023   10:00 AM 06/08/2024    2:00 PM  [employer] Mental Exam  Weekday Correct 1 1  Current year 1 1  What state are we in? 1 1  Amount spent 1 1  Amount left 0 0  # of Animals 1 1  5  objects recall 2 2  Number series 0 0  Hour markers 0 0  Time correct 0 0  Placed X in triangle correctly 1 1  Largest Figure 1 1  Name of female 0 0  Date back to work 0 0  Type of work 0 0  State she lived in 0 0  Total score 9 9   CN II-XII intact. Bulk and tone normal, muscle strength 5/5 throughout.  No rigidity, tremor or bradykinesia appreciated.  Sensation to light touch intact.  Deep tendon reflexes 1+ throughout, toes downgoing.  Finger to nose testing intact.  Gait with upright posture and slightly reduced step but good stride.  Uses cane for assistance.  Romberg negative.   Juliene Dunnings, DO  CC: Kennieth Leech, MD       "

## 2024-06-08 ENCOUNTER — Encounter: Payer: Self-pay | Admitting: Neurology

## 2024-06-08 ENCOUNTER — Ambulatory Visit (INDEPENDENT_AMBULATORY_CARE_PROVIDER_SITE_OTHER): Admitting: Neurology

## 2024-06-08 VITALS — BP 119/75 | HR 79 | Ht 62.0 in | Wt 135.0 lb

## 2024-06-08 DIAGNOSIS — R251 Tremor, unspecified: Secondary | ICD-10-CM

## 2024-06-08 DIAGNOSIS — F0390 Unspecified dementia without behavioral disturbance: Secondary | ICD-10-CM | POA: Diagnosis not present

## 2024-06-08 NOTE — Patient Instructions (Signed)
 Continue donepezil

## 2024-06-22 ENCOUNTER — Other Ambulatory Visit: Payer: Self-pay

## 2024-06-22 NOTE — Patient Outreach (Signed)
 Complex Care Management   Visit Note  06/22/2024  Name:  Nancy Liu MRN: [id number] DOB: 05-13-1941  Situation: Referral received for Complex Care Management related to post hospital ongoing management. I obtained verbal consent from Caregiver Patient.  Visit completed with Daughter, [name], and Ms. Rochford  on the phone  Background:   Past Medical History:  Diagnosis Date   Anxiety    Arteriosclerotic cardiovascular disease (ASCVD) 2001   Bare-metal stent placed in the right coronary artery in 12/01; residual 50% lesion of the first diagonal and mid LAD   Arthritis    Breast cancer (HCC)    right 2021   Cerebrovascular disease    Chronic kidney disease    COPD (chronic obstructive pulmonary disease) (HCC)    Coronary artery disease    Stent   Cyst, dermoid, arm, left 03/26/2018   Depression    Diabetes mellitus    excellent control with a low-dose of a single oral agent   Family history of breast cancer 01/23/2021   Family history of ovarian cancer 01/23/2021   GERD (gastroesophageal reflux disease)    with ulcers   History of right coronary artery stent placement 2000   Hyperlipidemia    Hypertension    Sleep apnea    no cpap   Thalassemia minor    Tobacco abuse, in remission    35 pack years; Quit in 1980    Assessment: Patient Reported Symptoms:  Cognitive Cognitive Status: Able to follow simple commands, Requires Assistance Decision Making, Normal speech and language skills, Struggling with memory recall      Neurological Neurological Review of Symptoms: No symptoms reported    HEENT HEENT Symptoms Reported: Not assessed      Cardiovascular Cardiovascular Symptoms Reported: No symptoms reported Weight: 136 lb (61.7 kg) (self-reports today's weight.)  Respiratory Respiratory Symptoms Reported: No symptoms reported    Endocrine Endocrine Symptoms Reported: No symptoms reported Is patient diabetic?: Yes Endocrine Comment: A1C 5.4 on 12/23/23   Gastrointestinal Gastrointestinal Symptoms Reported: Constipation Additional Gastrointestinal Details: Occasional constipation, takes Miralax  as needed. Gastrointestinal Management Strategies: Medication therapy Gastrointestinal Self-Management Outcome: 4 (good)    Genitourinary Genitourinary Symptoms Reported: No symptoms reported    Integumentary Integumentary Symptoms Reported: No symptoms reported    Musculoskeletal Musculoskelatal Symptoms Reviewed: Unsteady gait Musculoskeletal Comment: Uses cane, has a walker as well.  Discussed using walker on days she feels more unsteady, she agrees to this plan.      Psychosocial Psychosocial Symptoms Reported: Not assessed          06/22/2024    PHQ2-9 Depression Screening   Little interest or pleasure in doing things    Feeling down, depressed, or hopeless    PHQ-2 - Total Score    Trouble falling or staying asleep, or sleeping too much    Feeling tired or having little energy    Poor appetite or overeating     Feeling bad about yourself - or that you are a failure or have let yourself or your family down    Trouble concentrating on things, such as reading the newspaper or watching television    Moving or speaking so slowly that other people could have noticed.  Or the opposite - being so fidgety or restless that you have been moving around a lot more than usual    Thoughts that you would be better off dead, or hurting yourself in some way    PHQ2-9 Total Score    If you checked  off any problems, how difficult have these problems made it for you to do your work, take care of things at home, or get along with other people    Depression Interventions/Treatment      Today's Vitals   06/22/24 1441  Weight: 136 lb (61.7 kg)   Pain Scale: 0-10 Pain Score: 0-No pain  Medications Reviewed Today     Reviewed by Lucian Santana LABOR, RN (Registered Nurse) on 06/22/24 at 1441  Med List Status: <None>   Medication Order Taking? Sig  Documenting Provider Last Dose Status Informant  amLODipine  (NORVASC ) 10 MG tablet [id number] Yes TAKE ONE TABLET BY MOUTH EVERYDAY AT BEDTIME Alvan Dorn FALCON, MD  Active Family Member  aspirin  EC 81 MG tablet [id number] Yes Take 81 mg by mouth daily. Swallow whole. [provider]  Active   carvedilol  (COREG ) 6.25 MG tablet [id number] Yes TAKE ONE TABLET BY MOUTH BEFORE BREAKFAST and TAKE ONE TABLET BY MOUTH EVERYDAY AT BEDTIME Ricky Fines, MD  Active Family Member  clobetasol cream (TEMOVATE) 0.05 % [id number] Yes Apply topically 2 (two) times daily as needed. [provider]  Active   cyanocobalamin  (VITAMIN B12) 1000 MCG tablet [id number]  Take 1 tablet (1,000 mcg total) by mouth daily.  Patient not taking: Reported on 06/22/2024   Davonna Siad, MD  Active Family Member  docusate sodium  (COLACE) 100 MG capsule [id number] Yes Take 1 capsule (100 mg total) by mouth 2 (two) times daily.  Patient taking differently: Take 100 mg by mouth daily as needed for mild constipation.   Pappayliou, Dorothyann LABOR, DO  Active Family Member           Med Note ALYNE, SUZEN ORN   Tue Dec 29, 2023 10:51 AM) Not started   donepezil  (ARICEPT ) 5 MG tablet [id number] Yes Take 1 tablet (5 mg total) by mouth at bedtime. [name], DO  Active   feeding supplement (ENSURE ENLIVE / ENSURE PLUS) LIQD [id number] Yes Take 237 mLs by mouth 3 (three) times daily between meals. Maree Bracken D, DO  Active Family Member  ferrous sulfate  325 (65 FE) MG EC tablet [id number]  Take 1 tablet (325 mg total) by mouth every other day.  Patient not taking: Reported on 06/22/2024   Davonna Siad, MD  Active Family Member  furosemide (LASIX) 20 MG tablet [id number] Yes Take 20 mg by mouth 2 (two) times daily. [provider]  Active            Med Note LESLY, RICHERD CINDERELLA Kitchens Jan 25, 2024  3:49 PM) Started today  irbesartan -hydrochlorothiazide  (AVALIDE) 300-12.5 MG tablet [id number] Yes Take 1 tablet by  mouth daily before breakfast. Maree Bracken D, DO  Active Family Member  metFORMIN  (GLUCOPHAGE ) 500 MG tablet [id number] Yes Take 1 tablet (500 mg total) by mouth at bedtime. Maree Bracken D, DO  Active Family Member  mirtazapine  (REMERON ) 7.5 MG tablet [id number] Yes TAKE ONE TABLET BY MOUTH AT BEDTIME FOR DEPRESSION AND WEIGHT LOSS Lamon Pleasant CHRISTELLA DEVONNA  Active   [facility] ULTRA test strip [id number] Yes SMARTSIG:Strip(s) [provider]  Active Family Member  polyethylene glycol powder (GLYCOLAX /MIRALAX ) 17 GM/SCOOP powder [id number] Yes Take by mouth once. [provider]  Active   rosuvastatin  (CRESTOR ) 20 MG tablet [id number] Yes TAKE ONE TABLET BY MOUTH EVERYDAY AT BEDTIME Alvan Dorn FALCON, MD  Active Family Member  triamcinolone ointment (KENALOG) 0.5 % [id number] Yes Apply 1 application  topically 2 (two) times daily as needed (  rash). [provider]  Active Family Member           Med Note JERALYN DUNCANS A   Tue Oct 13, 2023 11:21 AM)    Med List Note Lesly, Richerd GRADE, [place] 01/01/24 1623): 01/01/24 spoke with daughter [name] and reviewed via AVS from hospital and updated   Pt's son Loman helps with meds [id number]            Recommendation:   Continue Current Plan of Care  Follow Up Plan:   Telephone follow-up in 1 month  Santana Stamp BSN, CCM [place]  [facility] Population Health RN Care Manager Direct Dial: [phone]  Fax: [phone]

## 2024-06-22 NOTE — Patient Instructions (Signed)
 Visit Information  Thank you for taking time to visit with me today. Please don't hesitate to contact me if I can be of assistance to you before our next scheduled appointment.  Your next care management appointment is by telephone on Wednesday, February 18th at 2:00pm.  Please call the care guide team at [phone] if you need to cancel, schedule, or reschedule an appointment.   Please call the [place]  National Suicide Prevention Lifeline: (423)233-5045 or TTY: [phone] TTY [phone]) to talk to a trained counselor if you are experiencing a Mental Health or Behavioral Health Crisis or need someone to talk to.  Santana Stamp BSN, CCM [place]  [facility] Population Health RN Care Manager Direct Dial: [phone]  Fax: [phone]

## 2024-06-30 ENCOUNTER — Encounter: Payer: Self-pay | Admitting: Oncology

## 2024-06-30 NOTE — Telephone Encounter (Signed)
 Daughter, [name] called to advise of above.  Has been applying Kenalog cream per direction of dermatology.  No longer seeing Dr. Shona and would like a referral to another Dermatologist if you feel it is not as a result of anything we would treat.  I advised that someone would reach out to her tomorrow with recommendations.

## 2024-07-01 ENCOUNTER — Encounter: Payer: PPO | Admitting: Psychology

## 2024-07-04 ENCOUNTER — Ambulatory Visit: Payer: PPO | Admitting: Neurology

## 2024-07-14 ENCOUNTER — Institutional Professional Consult (permissible substitution): Admitting: Psychology

## 2024-07-14 ENCOUNTER — Ambulatory Visit: Payer: Self-pay

## 2024-07-18 ENCOUNTER — Inpatient Hospital Stay: Admitting: Psychiatry

## 2024-07-20 ENCOUNTER — Telehealth

## 2024-07-21 ENCOUNTER — Encounter: Admitting: Psychology

## 2024-07-28 ENCOUNTER — Other Ambulatory Visit (HOSPITAL_COMMUNITY)

## 2024-07-28 ENCOUNTER — Inpatient Hospital Stay: Payer: Self-pay | Attending: Oncology

## 2024-07-28 ENCOUNTER — Inpatient Hospital Stay: Admitting: Oncology

## 2024-08-01 ENCOUNTER — Inpatient Hospital Stay: Payer: Self-pay | Attending: Oncology | Admitting: Psychiatry

## 2024-08-04 ENCOUNTER — Inpatient Hospital Stay: Payer: Self-pay | Attending: Oncology | Admitting: Oncology

## 2025-03-07 ENCOUNTER — Ambulatory Visit: Admitting: Neurology

## 2025-03-08 ENCOUNTER — Ambulatory Visit: Payer: Self-pay | Admitting: Neurology

## 2025-03-29 ENCOUNTER — Ambulatory Visit: Admitting: Physician Assistant
